# Patient Record
Sex: Female | Born: 1960 | Hispanic: No | Marital: Married | Smoking: Never smoker
Health system: Southern US, Community
[De-identification: ages and names within clinical notes are randomized; demographics above are authoritative.]

## PROBLEM LIST (undated history)

## (undated) DIAGNOSIS — C801 Malignant (primary) neoplasm, unspecified: Secondary | ICD-10-CM

## (undated) DIAGNOSIS — M359 Systemic involvement of connective tissue, unspecified: Secondary | ICD-10-CM

## (undated) DIAGNOSIS — J45909 Unspecified asthma, uncomplicated: Secondary | ICD-10-CM

## (undated) DIAGNOSIS — R112 Nausea with vomiting, unspecified: Secondary | ICD-10-CM

## (undated) DIAGNOSIS — I10 Essential (primary) hypertension: Secondary | ICD-10-CM

## (undated) DIAGNOSIS — L940 Localized scleroderma [morphea]: Secondary | ICD-10-CM

## (undated) DIAGNOSIS — F32A Depression, unspecified: Secondary | ICD-10-CM

## (undated) DIAGNOSIS — Z9889 Other specified postprocedural states: Secondary | ICD-10-CM

## (undated) DIAGNOSIS — Z8719 Personal history of other diseases of the digestive system: Secondary | ICD-10-CM

## (undated) DIAGNOSIS — F419 Anxiety disorder, unspecified: Secondary | ICD-10-CM

## (undated) DIAGNOSIS — K219 Gastro-esophageal reflux disease without esophagitis: Secondary | ICD-10-CM

## (undated) DIAGNOSIS — I890 Lymphedema, not elsewhere classified: Secondary | ICD-10-CM

## (undated) DIAGNOSIS — Z86718 Personal history of other venous thrombosis and embolism: Secondary | ICD-10-CM

## (undated) HISTORY — PX: PLANTAR FASCIECTOMY: SUR600

## (undated) HISTORY — PX: BASAL CELL CARCINOMA EXCISION: SHX1214

## (undated) HISTORY — PX: ABDOMINAL HYSTERECTOMY: SHX81

## (undated) HISTORY — PX: TONSILLECTOMY: SUR1361

## (undated) HISTORY — PX: SHOULDER SURGERY: SHX246

## (undated) HISTORY — PX: CHOLECYSTECTOMY: SHX55

## (undated) HISTORY — PX: HERNIA REPAIR: SHX51

---
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
## 2002-12-06 HISTORY — PX: HERNIA REPAIR: SHX51

## 2006-12-06 HISTORY — PX: CHOLECYSTECTOMY: SHX55

## 2011-12-07 DIAGNOSIS — C55 Malignant neoplasm of uterus, part unspecified: Secondary | ICD-10-CM | POA: Insufficient documentation

## 2012-02-14 HISTORY — PX: ROBOTIC ASSISTED TOTAL HYSTERECTOMY WITH BILATERAL SALPINGO OOPHERECTOMY: SHX6086

## 2016-06-02 DIAGNOSIS — I10 Essential (primary) hypertension: Secondary | ICD-10-CM | POA: Insufficient documentation

## 2016-09-18 DIAGNOSIS — L0390 Cellulitis, unspecified: Secondary | ICD-10-CM | POA: Insufficient documentation

## 2017-04-07 DIAGNOSIS — C801 Malignant (primary) neoplasm, unspecified: Secondary | ICD-10-CM | POA: Insufficient documentation

## 2017-08-09 DIAGNOSIS — I890 Lymphedema, not elsewhere classified: Secondary | ICD-10-CM | POA: Insufficient documentation

## 2017-08-09 DIAGNOSIS — Z780 Asymptomatic menopausal state: Secondary | ICD-10-CM | POA: Insufficient documentation

## 2017-08-09 DIAGNOSIS — Z8542 Personal history of malignant neoplasm of other parts of uterus: Secondary | ICD-10-CM | POA: Insufficient documentation

## 2017-08-09 DIAGNOSIS — I10 Essential (primary) hypertension: Secondary | ICD-10-CM | POA: Diagnosis present

## 2017-08-11 DIAGNOSIS — L821 Other seborrheic keratosis: Secondary | ICD-10-CM | POA: Insufficient documentation

## 2017-11-16 DIAGNOSIS — R5383 Other fatigue: Secondary | ICD-10-CM | POA: Insufficient documentation

## 2017-12-07 DIAGNOSIS — K529 Noninfective gastroenteritis and colitis, unspecified: Secondary | ICD-10-CM | POA: Insufficient documentation

## 2018-03-20 DIAGNOSIS — M222X1 Patellofemoral disorders, right knee: Secondary | ICD-10-CM | POA: Insufficient documentation

## 2018-04-05 DIAGNOSIS — C541 Malignant neoplasm of endometrium: Secondary | ICD-10-CM | POA: Insufficient documentation

## 2018-04-20 ENCOUNTER — Other Ambulatory Visit (HOSPITAL_COMMUNITY): Payer: Self-pay | Admitting: Orthopedic Surgery

## 2018-04-20 DIAGNOSIS — M2351 Chronic instability of knee, right knee: Secondary | ICD-10-CM

## 2018-04-20 DIAGNOSIS — G8929 Other chronic pain: Secondary | ICD-10-CM

## 2018-04-20 DIAGNOSIS — M25561 Pain in right knee: Principal | ICD-10-CM

## 2018-04-20 DIAGNOSIS — M25461 Effusion, right knee: Secondary | ICD-10-CM

## 2018-04-20 DIAGNOSIS — M2391 Unspecified internal derangement of right knee: Secondary | ICD-10-CM

## 2018-07-17 ENCOUNTER — Other Ambulatory Visit
Admission: RE | Admit: 2018-07-17 | Discharge: 2018-07-17 | Disposition: A | Discharge status: Home / Self Care | Payer: BC Managed Care – PPO | Source: Ambulatory Visit | Attending: Unknown Physician Specialty | Admitting: Unknown Physician Specialty

## 2018-07-17 DIAGNOSIS — M222X1 Patellofemoral disorders, right knee: Secondary | ICD-10-CM | POA: Diagnosis not present

## 2018-07-17 LAB — SYNOVIAL CELL COUNT + DIFF, W/ CRYSTALS
Crystals, Fluid: NONE SEEN
EOSINOPHILS-SYNOVIAL: 0 %
Lymphocytes-Synovial Fld: 17 %
MONOCYTE-MACROPHAGE-SYNOVIAL FLUID: 18 %
Neutrophil, Synovial: 65 %
OTHER CELLS-SYN: 0
WBC, Synovial: 101 /mm3 (ref 0–200)

## 2018-07-21 LAB — BODY FLUID CULTURE: CULTURE: NO GROWTH

## 2018-07-26 DIAGNOSIS — M25561 Pain in right knee: Secondary | ICD-10-CM | POA: Insufficient documentation

## 2018-12-13 DIAGNOSIS — Z01818 Encounter for other preprocedural examination: Secondary | ICD-10-CM | POA: Insufficient documentation

## 2019-04-04 DIAGNOSIS — M25511 Pain in right shoulder: Secondary | ICD-10-CM | POA: Insufficient documentation

## 2019-07-11 DIAGNOSIS — E785 Hyperlipidemia, unspecified: Secondary | ICD-10-CM | POA: Insufficient documentation

## 2019-11-12 ENCOUNTER — Other Ambulatory Visit: Payer: Self-pay

## 2019-11-12 DIAGNOSIS — Z20822 Contact with and (suspected) exposure to covid-19: Secondary | ICD-10-CM

## 2019-11-14 LAB — NOVEL CORONAVIRUS, NAA: SARS-CoV-2, NAA: NOT DETECTED

## 2020-01-16 DIAGNOSIS — G479 Sleep disorder, unspecified: Secondary | ICD-10-CM | POA: Insufficient documentation

## 2020-02-15 DIAGNOSIS — K219 Gastro-esophageal reflux disease without esophagitis: Secondary | ICD-10-CM | POA: Diagnosis present

## 2020-02-15 DIAGNOSIS — R197 Diarrhea, unspecified: Secondary | ICD-10-CM | POA: Insufficient documentation

## 2020-02-26 DIAGNOSIS — D126 Benign neoplasm of colon, unspecified: Secondary | ICD-10-CM | POA: Insufficient documentation

## 2020-07-11 DIAGNOSIS — R600 Localized edema: Secondary | ICD-10-CM | POA: Insufficient documentation

## 2020-07-11 DIAGNOSIS — I82409 Acute embolism and thrombosis of unspecified deep veins of unspecified lower extremity: Secondary | ICD-10-CM | POA: Insufficient documentation

## 2020-07-14 DIAGNOSIS — L659 Nonscarring hair loss, unspecified: Secondary | ICD-10-CM | POA: Insufficient documentation

## 2020-07-30 DIAGNOSIS — M542 Cervicalgia: Secondary | ICD-10-CM | POA: Insufficient documentation

## 2020-07-30 DIAGNOSIS — M549 Dorsalgia, unspecified: Secondary | ICD-10-CM | POA: Insufficient documentation

## 2020-07-30 DIAGNOSIS — M7918 Myalgia, other site: Secondary | ICD-10-CM | POA: Insufficient documentation

## 2020-07-30 DIAGNOSIS — M47812 Spondylosis without myelopathy or radiculopathy, cervical region: Secondary | ICD-10-CM | POA: Insufficient documentation

## 2020-07-30 DIAGNOSIS — G8929 Other chronic pain: Secondary | ICD-10-CM | POA: Insufficient documentation

## 2020-08-19 DIAGNOSIS — G43009 Migraine without aura, not intractable, without status migrainosus: Secondary | ICD-10-CM | POA: Insufficient documentation

## 2020-12-19 DIAGNOSIS — Z20822 Contact with and (suspected) exposure to covid-19: Secondary | ICD-10-CM | POA: Insufficient documentation

## 2021-03-23 DIAGNOSIS — E063 Autoimmune thyroiditis: Secondary | ICD-10-CM | POA: Insufficient documentation

## 2021-07-06 DIAGNOSIS — E063 Autoimmune thyroiditis: Secondary | ICD-10-CM | POA: Insufficient documentation

## 2021-09-07 DIAGNOSIS — R059 Cough, unspecified: Secondary | ICD-10-CM | POA: Insufficient documentation

## 2021-09-22 ENCOUNTER — Ambulatory Visit
Admission: RE | Admit: 2021-09-22 | Discharge: 2021-09-22 | Disposition: A | Discharge status: Home / Self Care | Payer: BC Managed Care – PPO | Attending: Dermatology | Admitting: Dermatology

## 2021-09-22 ENCOUNTER — Other Ambulatory Visit: Payer: Self-pay | Admitting: Dermatology

## 2021-09-22 ENCOUNTER — Ambulatory Visit
Admission: RE | Admit: 2021-09-22 | Discharge: 2021-09-22 | Disposition: A | Discharge status: Home / Self Care | Payer: BC Managed Care – PPO | Source: Ambulatory Visit | Attending: Dermatology | Admitting: Dermatology

## 2021-09-22 DIAGNOSIS — J45901 Unspecified asthma with (acute) exacerbation: Secondary | ICD-10-CM

## 2021-10-07 ENCOUNTER — Ambulatory Visit: Admitting: Internal Medicine

## 2021-10-13 DIAGNOSIS — R21 Rash and other nonspecific skin eruption: Secondary | ICD-10-CM | POA: Insufficient documentation

## 2021-10-20 DIAGNOSIS — R768 Other specified abnormal immunological findings in serum: Secondary | ICD-10-CM | POA: Insufficient documentation

## 2021-10-20 DIAGNOSIS — R7689 Other specified abnormal immunological findings in serum: Secondary | ICD-10-CM | POA: Insufficient documentation

## 2021-11-04 ENCOUNTER — Other Ambulatory Visit: Payer: Self-pay | Admitting: Orthopedic Surgery

## 2021-11-04 DIAGNOSIS — M25512 Pain in left shoulder: Secondary | ICD-10-CM

## 2021-11-04 DIAGNOSIS — R7989 Other specified abnormal findings of blood chemistry: Secondary | ICD-10-CM | POA: Insufficient documentation

## 2021-12-01 ENCOUNTER — Other Ambulatory Visit

## 2021-12-10 DIAGNOSIS — H9221 Otorrhagia, right ear: Secondary | ICD-10-CM | POA: Insufficient documentation

## 2021-12-14 ENCOUNTER — Other Ambulatory Visit: Payer: Self-pay

## 2021-12-14 ENCOUNTER — Ambulatory Visit
Admission: RE | Admit: 2021-12-14 | Discharge: 2021-12-14 | Disposition: A | Discharge status: Home / Self Care | Payer: BC Managed Care – PPO | Source: Ambulatory Visit | Attending: Orthopedic Surgery | Admitting: Orthopedic Surgery

## 2021-12-14 DIAGNOSIS — M25512 Pain in left shoulder: Secondary | ICD-10-CM

## 2022-01-06 DIAGNOSIS — M7502 Adhesive capsulitis of left shoulder: Secondary | ICD-10-CM | POA: Insufficient documentation

## 2022-01-12 DIAGNOSIS — R3129 Other microscopic hematuria: Secondary | ICD-10-CM | POA: Insufficient documentation

## 2022-01-27 DIAGNOSIS — M7502 Adhesive capsulitis of left shoulder: Secondary | ICD-10-CM | POA: Insufficient documentation

## 2022-01-27 DIAGNOSIS — M19012 Primary osteoarthritis, left shoulder: Secondary | ICD-10-CM | POA: Insufficient documentation

## 2022-02-03 ENCOUNTER — Encounter: Payer: Self-pay | Admitting: Urology

## 2022-02-03 ENCOUNTER — Other Ambulatory Visit: Payer: Self-pay

## 2022-02-03 ENCOUNTER — Ambulatory Visit (INDEPENDENT_AMBULATORY_CARE_PROVIDER_SITE_OTHER): Payer: BC Managed Care – PPO | Admitting: Urology

## 2022-02-03 VITALS — BP 143/75 | HR 76 | Ht 67.0 in | Wt 172.0 lb

## 2022-02-03 DIAGNOSIS — R319 Hematuria, unspecified: Secondary | ICD-10-CM

## 2022-02-03 DIAGNOSIS — R399 Unspecified symptoms and signs involving the genitourinary system: Secondary | ICD-10-CM | POA: Diagnosis not present

## 2022-02-03 DIAGNOSIS — R3129 Other microscopic hematuria: Secondary | ICD-10-CM

## 2022-02-03 LAB — BLADDER SCAN AMB NON-IMAGING: Scan Result: 0

## 2022-02-03 MED ORDER — OXYBUTYNIN CHLORIDE 5 MG PO TABS: 5.0000 mg | ORAL_TABLET | Freq: Three times a day (TID) | ORAL | 1 refills | Status: DC

## 2022-02-04 ENCOUNTER — Encounter: Payer: Self-pay | Admitting: Urology

## 2022-02-04 LAB — URINALYSIS, COMPLETE
Bilirubin, UA: NEGATIVE
Glucose, UA: NEGATIVE
Ketones, UA: NEGATIVE
Leukocytes,UA: NEGATIVE
Nitrite, UA: NEGATIVE
Protein,UA: NEGATIVE
Specific Gravity, UA: 1.01 (ref 1.005–1.030)
Urobilinogen, Ur: 0.2 mg/dL (ref 0.2–1.0)
pH, UA: 7 (ref 5.0–7.5)

## 2022-02-04 LAB — MICROSCOPIC EXAMINATION
Bacteria, UA: NONE SEEN
Epithelial Cells (non renal): NONE SEEN /hpf (ref 0–10)
WBC, UA: NONE SEEN /hpf (ref 0–5)

## 2022-02-04 NOTE — Progress Notes (Signed)
? ?02/03/2022 ?7:18 AM  ? ?Elizabeth Martin ?06/13/1961 ?[id number] ? ?Referring provider: Dianne Dun, MD ?[address] ?[place],  [place] [address] ? ?Chief Complaint  ?Patient presents with  ? Hematuria  ? ? ?HPI: ?Elizabeth Martin is a 61 y.o. female referred for evaluation of hematuria. ? ?Seen by her rheumatologist November 2022 and UA showed 4-10 RBCs ?Follow-up with her PCP was recommended who she saw earlier this month and UA showed 24 RBCs on microscopy ?CT urogram performed 01/25/2022 which showed no significant upper tract abnormalities.  There were small bilateral hypoattenuating renal lesions statistically consistent with renal cysts ?Denies gross hematuria ?Prior history of pelvic radiation for endometrial cancer and postradiation has noted urinary frequency, urgency with occasional episodes urge incontinence ? ? ?PMH: ? Asthma  ? Cancer [facility])  ? Clotting disorder ([place])  ? Hashimoto's disease  ? Hypertension  ? ?Surgical History: ? CHOLECYSTECTOMY  ?1998  ? HERNIA REPAIR  ?2002  ? HYSTERECTOMY  ?2013  ? TUBAL LIGATION  ?1986  ? ?Home Medications:  ?Allergies as of 02/03/2022   ? ?   Reactions  ? Carboplatin Anaphylaxis  ? ANAPHYLACTIC REACTION ?ANAPHYLACTIC REACTION  ? Nitrofurantoin Nausea Only, Nausea And Vomiting  ? Silicone Itching, Rash  ? Wheat Bran Other (See Comments)  ? Other reaction(s): Other (See Comments) ?Body aches and swelling ?Body aches and swelling  ? Fentanyl Hives, Itching, Rash  ? ?  ? ?  ?Medication List  ?  ? ?  ? Accurate as of February 03, 2022 11:59 PM. If you have any questions, ask your nurse or doctor.  ?  ?  ? ?  ? ?albuterol 108 (90 Base) MCG/ACT inhaler ?Commonly known as: VENTOLIN HFA ?Inhale 2 puffs into the lungs every 6 (six) hours as needed. ?  ?benzonatate 100 MG capsule ?Commonly known as: TESSALON ?Take 100 mg by mouth every 6 (six) hours as needed. ?  ?cetirizine 10 MG tablet ?Commonly known as: ZYRTEC ?Take 10 mg by mouth daily. ?  ?chlorthalidone 50 MG  tablet ?Commonly known as: HYGROTON ?Take 50 mg by mouth every morning. ?  ?DULoxetine 30 MG capsule ?Commonly known as: CYMBALTA ?Take 90 mg by mouth daily. ?  ?gabapentin 100 MG capsule ?Commonly known as: NEURONTIN ?Take 100 mg by mouth at bedtime. ?  ?hydroxychloroquine 200 MG tablet ?Commonly known as: PLAQUENIL ?Take 200 mg by mouth 2 (two) times daily. ?  ?hydrOXYzine 25 MG tablet ?Commonly known as: ATARAX ?SMARTSIG:1-2 Tablet(s) By Mouth Every Evening ?  ?ibuprofen 800 MG tablet ?Commonly known as: ADVIL ?ibuprofen 800 mg tablet ?  ?lisinopril 20 MG tablet ?Commonly known as: ZESTRIL ?Take 1 tablet by mouth daily. ?  ?LORazepam 1 MG tablet ?Commonly known as: ATIVAN ?lorazepam 1 mg tablet ?  ?multivitamin capsule ?Take 1 capsule by mouth daily. ?  ?ofloxacin 0.3 % OTIC solution ?Commonly known as: FLOXIN ?SMARTSIG:10 Drop(s) In Ear(s) Twice Daily ?  ?oxybutynin 5 MG tablet ?Commonly known as: DITROPAN ?Take 1 tablet (5 mg total) by mouth 3 (three) times daily. ?Started by: Abbie Sons, MD ?  ?potassium chloride 10 MEQ tablet ?Commonly known as: KLOR-CON ?potassium chloride ER 10 mEq tablet,extended release ? Take 1 tablet every day by oral route. ?  ?tretinoin 0.1 % cream ?Commonly known as: RETIN-A ?tretinoin 0.1 % topical cream ?  ?venlafaxine XR 150 MG 24 hr capsule ?Commonly known as: EFFEXOR-XR ?venlafaxine ER 150 mg capsule,extended release 24 hr ?  ? ?  ? ? ?  Allergies:  ?Allergies  ?Allergen Reactions  ? Carboplatin Anaphylaxis  ?  ANAPHYLACTIC REACTION ?ANAPHYLACTIC REACTION ?  ? Nitrofurantoin Nausea Only and Nausea And Vomiting  ? Silicone Itching and Rash  ? Wheat Bran Other (See Comments)  ?  Other reaction(s): Other (See Comments) ?Body aches and swelling ?Body aches and swelling ?  ? Fentanyl Hives, Itching and Rash  ? ? ?Family History: ?No family history on file. ? ?Social History:  reports that she has never smoked. She has never used smokeless tobacco. She reports that she does not  drink alcohol. No history on file for drug use. ? ? ?Physical Exam: ?BP (!) 143/75   Pulse 76   Ht 5\' 7"  (1.702 m)   Wt 172 lb (78 kg)   BMI 26.94 kg/m?   ?Constitutional:  Alert and oriented, No acute distress. ?HEENT: [place] AT, moist mucus membranes.  Trachea midline, no masses. ?Respiratory: Normal respiratory effort, no increased work of breathing. ?GI: Abdomen is soft, nontender, nondistended, no abdominal masses ?Psychiatric: Normal mood and affect. ? ?Laboratory Data: ? ?Urinalysis ?Dipstick/microscopy negative ? ? ?Pertinent Imaging: ?CT images were personally reviewed and interpreted on [facility] ? ?Assessment & Plan:   ? ?1.  Microscopic hematuria ?UA today clear ?AUA hematuria risk stratification: High ?The recommended evaluation of high risk hematuria was discussed.  She has had upper tract evaluation with CT urogram and recommend scheduling cystoscopy for lower tract evaluation.  The procedure was discussed in detail. ?Prior history pelvic radiation with slight increased risk of urothelial carcinoma bladder ? ?2.  Lower urinary tract symptoms ?Occurring after radiation ?Symptoms are not bothersome when she is at home however she did inquire if there is any prn medication she could take when she is out.  Trial oxybutynin IR  ? ? ?Abbie Sons, MD ? ?[place] ?[address], [address] ?[place],  [address] ?[phone] ? ?

## 2022-02-12 ENCOUNTER — Ambulatory Visit (INDEPENDENT_AMBULATORY_CARE_PROVIDER_SITE_OTHER): Payer: BC Managed Care – PPO | Admitting: Urology

## 2022-02-12 ENCOUNTER — Encounter: Payer: Self-pay | Admitting: Urology

## 2022-02-12 ENCOUNTER — Other Ambulatory Visit: Payer: Self-pay

## 2022-02-12 VITALS — BP 131/81 | HR 83 | Ht 67.0 in | Wt 170.0 lb

## 2022-02-12 DIAGNOSIS — R3129 Other microscopic hematuria: Secondary | ICD-10-CM | POA: Diagnosis not present

## 2022-02-12 NOTE — Progress Notes (Signed)
? ?  02/12/22 ? ?CC:  ?Chief Complaint  ?Patient presents with  ? Cysto  ? ? ?HPI: Refer to my previous note 02/03/2022.  No complaints today ? ?Blood pressure 131/81, pulse 83, height '5\' 7"'$  (1.702 m), weight 170 lb (77.1 kg). ?NED. A&Ox3.   ?No respiratory distress   ?Abd soft, NT, ND ?Atrophic external genitalia with patent urethral meatus ? ?Cystoscopy Procedure Note ? ?Patient identification was confirmed, informed consent was obtained, and patient was prepped using Betadine solution.  Lidocaine jelly was administered per urethral meatus.   ? ?Procedure: ?- Flexible cystoscope introduced, without any difficulty.   ?- Thorough search of the bladder revealed: ?   normal urethral meatus ?   normal urothelium ?   no stones ?   no ulcers  ?   no tumors ?   Prominent hypervascularity-most likely radiation effect ?   no urethral polyps ?   no trabeculation ? ?- Ureteral orifices were normal in position and appearance. ? ?Post-Procedure: ?- Patient tolerated the procedure well ? ?Assessment/ Plan: ?No mucosal abnormalities ?1 year follow-up with UA ? ? ?Abbie Sons, MD ? ?

## 2022-02-13 ENCOUNTER — Encounter: Payer: Self-pay | Admitting: Urology

## 2022-02-13 LAB — URINALYSIS, COMPLETE
Bilirubin, UA: NEGATIVE
Glucose, UA: NEGATIVE
Ketones, UA: NEGATIVE
Leukocytes,UA: NEGATIVE
Nitrite, UA: NEGATIVE
Protein,UA: NEGATIVE
Specific Gravity, UA: 1.01 (ref 1.005–1.030)
Urobilinogen, Ur: 0.2 mg/dL (ref 0.2–1.0)
pH, UA: 7 (ref 5.0–7.5)

## 2022-02-13 LAB — MICROSCOPIC EXAMINATION
Bacteria, UA: NONE SEEN
Epithelial Cells (non renal): NONE SEEN /hpf (ref 0–10)
WBC, UA: NONE SEEN /hpf (ref 0–5)

## 2022-02-16 LAB — CYTOLOGY - NON PAP

## 2022-02-17 ENCOUNTER — Encounter: Payer: Self-pay | Admitting: Urology

## 2022-02-17 DIAGNOSIS — M351 Other overlap syndromes: Secondary | ICD-10-CM | POA: Insufficient documentation

## 2022-02-24 DIAGNOSIS — M1712 Unilateral primary osteoarthritis, left knee: Secondary | ICD-10-CM | POA: Insufficient documentation

## 2022-02-25 ENCOUNTER — Other Ambulatory Visit: Payer: BC Managed Care – PPO | Admitting: Urology

## 2022-02-26 ENCOUNTER — Encounter: Payer: Self-pay | Admitting: Urology

## 2022-02-26 ENCOUNTER — Other Ambulatory Visit: Payer: Self-pay | Admitting: Urology

## 2022-02-26 NOTE — Telephone Encounter (Signed)
Called pt, no answer. LM for pt to call back to clarify if she is taking Oxybutynin and if she does in fact need a refill.  ?

## 2022-03-24 HISTORY — PX: ROTATOR CUFF REPAIR: SHX139

## 2022-03-29 ENCOUNTER — Ambulatory Visit: Payer: Self-pay | Admitting: *Deleted

## 2022-03-29 NOTE — Telephone Encounter (Signed)
?  Chief Complaint: Leg pain ?Symptoms: 8/10 right leg pain at calf. Has lymphedema so unsure of swelling. 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 ?Frequency: This AM ?Pertinent Negatives: Patient denies SOB ?Disposition: '[x]'$ ED /'[]'$ Urgent Care (no appt availability in office) / '[]'$ Appointment(In office/virtual)/ '[]'$  [facility]/ '[]'$ Home Care/ '[]'$ Refused Recommended Disposition /'[]'$ [facility]/ '[]'$  Follow-up with PCP ?Additional Notes: Pt unsure she will follow disposition.Reiterated  need for ED with history. States will discuss with husband. ?Reason for Disposition ? [1] Thigh or calf pain AND [2] only 1 side AND [3] present > 1 hour (Exception: chronic unchanged pain) ? ?Answer Assessment - Initial Assessment Questions ?1. ONSET: "When did the pain start?"  ?    This AM ?2. LOCATION: "Where is the pain located?"  ?    Right calf ?3. PAIN: "How bad is the pain?"    (Scale 1-10; or mild, moderate, severe) ?  -  MILD (1-3): doesn't interfere with normal activities  ?  -  MODERATE (4-7): interferes with normal activities (e.g., work or school) or awakens from sleep, limping  ?  -  SEVERE (8-10): excruciating pain, unable to do any normal activities, unable to walk ?    8/10 with movement ?4. WORK OR EXERCISE: "Has there been any recent work or exercise that involved this part of the body?"  ?     ?5. CAUSE: "What do you think is causing the leg pain?" ?     ?6. OTHER SYMPTOMS: "Do you have any other symptoms?" (e.g., chest pain, back pain, breathing difficulty, swelling, rash, fever, numbness, weakness) ?    None ? ?Protocols used: Leg Pain-A-AH ? ?

## 2022-03-31 ENCOUNTER — Emergency Department
Admission: EM | Admit: 2022-03-31 | Discharge: 2022-03-31 | Disposition: A | Discharge status: Home / Self Care | Payer: BC Managed Care – PPO | Attending: Emergency Medicine | Admitting: Emergency Medicine

## 2022-03-31 ENCOUNTER — Encounter: Payer: Self-pay | Admitting: Emergency Medicine

## 2022-03-31 ENCOUNTER — Emergency Department: Payer: BC Managed Care – PPO

## 2022-03-31 ENCOUNTER — Other Ambulatory Visit: Payer: Self-pay

## 2022-03-31 DIAGNOSIS — M79604 Pain in right leg: Secondary | ICD-10-CM | POA: Diagnosis present

## 2022-03-31 DIAGNOSIS — I8001 Phlebitis and thrombophlebitis of superficial vessels of right lower extremity: Secondary | ICD-10-CM | POA: Insufficient documentation

## 2022-03-31 DIAGNOSIS — M79661 Pain in right lower leg: Secondary | ICD-10-CM

## 2022-03-31 HISTORY — DX: Personal history of other venous thrombosis and embolism: Z86.718

## 2022-03-31 HISTORY — DX: Systemic involvement of connective tissue, unspecified: M35.9

## 2022-03-31 HISTORY — DX: Malignant (primary) neoplasm, unspecified: C80.1

## 2022-03-31 HISTORY — DX: Unspecified asthma, uncomplicated: J45.909

## 2022-03-31 HISTORY — DX: Essential (primary) hypertension: I10

## 2022-03-31 LAB — BASIC METABOLIC PANEL
Anion gap: 10 (ref 5–15)
BUN: 15 mg/dL (ref 6–20)
CO2: 29 mmol/L (ref 22–32)
Calcium: 9.3 mg/dL (ref 8.9–10.3)
Chloride: 97 mmol/L — ABNORMAL LOW (ref 98–111)
Creatinine, Ser: 0.62 mg/dL (ref 0.44–1.00)
GFR, Estimated: >60 mL/min (ref >60)
Glucose, Bld: 128 mg/dL — ABNORMAL HIGH (ref 70–99)
Potassium: 3.3 mmol/L — ABNORMAL LOW (ref 3.5–5.1)
Sodium: 136 mmol/L (ref 135–145)

## 2022-03-31 LAB — CBC
HCT: 41.3 % (ref 36.0–46.0)
Hemoglobin: 13.7 g/dL (ref 12.0–15.0)
MCH: 29.7 pg (ref 26.0–34.0)
MCHC: 33.2 g/dL (ref 30.0–36.0)
MCV: 89.4 fL (ref 80.0–100.0)
Platelets: 393 10*3/uL (ref 150–400)
RBC: 4.62 MIL/uL (ref 3.87–5.11)
RDW: 13.3 % (ref 11.5–15.5)
WBC: 9.4 10*3/uL (ref 4.0–10.5)
nRBC: 0 % (ref 0.0–0.2)

## 2022-03-31 MED ORDER — CLOPIDOGREL BISULFATE 75 MG PO TABS: 75.0000 mg | ORAL_TABLET | Freq: Every day | ORAL | 3 refills | Status: DC

## 2022-03-31 NOTE — Discharge Instructions (Signed)
Your ultrasound showed a thrombus superficial vein of the right calf muscle.  A prescription for Plavix has been sent to the pharmacy.  You may consult with your vascular specialist prior starting the medication.  Rest with the leg elevated and apply warm compress to help promote resolution. ?

## 2022-03-31 NOTE — ED Triage Notes (Signed)
Pt to ED from home c/o right calf pain 2 days getting worse.  Some swelling to right leg without redness.  Had left shoulder surgery on 4/19.  Hx of blood clots and states this pain feels similar.  Denies SOB and not on blood thinners.  Pt A&Ox4, chest rise even and unlabored, skin WNL and in NAD at this time. ?

## 2022-03-31 NOTE — ED Notes (Signed)
US at the bedside

## 2022-03-31 NOTE — ED Provider Notes (Signed)
? ? ?[facility] ?Emergency Department Provider Note ? ? ? ? Event Date/Time  ? First MD Initiated Contact with Patient 03/31/22 1938   ?  (approximate) ? ? ?History  ? ?Leg Pain ? ? ?HPI ? ?Elizabeth Martin is a 61 y.o. female with a history of RLE lymphangitis, presents to the ED with 2 days of right calf pain and swelling.  Patient denies any significant redness and denies any preceding injury or trauma.  Patient had shoulder surgery on April 19 without incident.  She has a history of blood clots and reports this pain feels similar.  She denies any current blood thinner therapy.  She denies any chest pain, shortness of breath, or distal paresthesias. ?  ? ? ?Physical Exam  ? ?Triage Vital Signs: ?ED Triage Vitals  ?Enc Vitals Group  ?   BP 03/31/22 1918 131/83  ?   Pulse Rate 03/31/22 1918 (!) 102  ?   Resp 03/31/22 1918 16  ?   Temp 03/31/22 1918 99.2 ?F (37.3 ?C)  ?   Temp Source 03/31/22 1918 Oral  ?   SpO2 03/31/22 1918 97 %  ?   Weight 03/31/22 1918 165 lb (74.8 kg)  ?   Height 03/31/22 1918 '5\' 7"'$  (1.702 m)  ?   Head Circumference --   ?   Peak Flow --   ?   Pain Score 03/31/22 1917 8  ?   Pain Loc --   ?   Pain Edu? --   ?   Excl. in [place]? --   ? ? ?Most recent vital signs: ?Vitals:  ? 03/31/22 1918 03/31/22 2216  ?BP: 131/83 128/77  ?Pulse: (!) 102 98  ?Resp: 16 20  ?Temp: 99.2 ?F (37.3 ?C)   ?SpO2: 97% 98%  ? ? ?General Awake, no distress.  ?CV:  Good peripheral perfusion. Skin warm and dry. ?RESP:  Normal effort.  ?ABD:  No distention.  ?MSK:  RLE lymphangitis noted. No erythetma or induration. Tender to palp over the gastroc muscle belly ? ?ED Results / Procedures / Treatments  ? ?Labs ?(all labs ordered are listed, but only abnormal results are displayed) ?Labs Reviewed  ?BASIC METABOLIC PANEL - Abnormal; Notable for the following components:  ?    Result Value  ? Potassium 3.3 (*)   ? Chloride 97 (*)   ? Glucose, Bld 128 (*)   ? All other components within normal limits  ?CBC   ? ? ? ?EKG ? ? ?RADIOLOGY ? ?I personally viewed and evaluated these images as part of my medical decision making, as well as reviewing the written report by the radiologist. ? ?ED Provider Interpretation: RLE thrombus noted} ? ?US Venous Img Lower Unilateral Right ? ?Result Date: 03/31/2022 ?CLINICAL DATA:  Right calf pain. EXAM: RIGHT LOWER EXTREMITY VENOUS DOPPLER ULTRASOUND TECHNIQUE: Gray-scale sonography with compression, as well as color and duplex ultrasound, were performed to evaluate the deep venous system(s) from the level of the common femoral vein through the popliteal and proximal calf veins. COMPARISON:  None. FINDINGS: VENOUS Normal compressibility of the common femoral, superficial femoral, and popliteal veins. The posterior tibial and peroneal veins in the calf are patent. Visualized portions of profunda femoral vein and great saphenous vein unremarkable. No filling defects to suggest DVT on grayscale or color Doppler imaging. Doppler waveforms show normal direction of venous flow, normal respiratory plasticity and response to augmentation. There is thrombus within the intramuscular gastrocnemius vein that appears occlusive. Limited views  of the contralateral common femoral vein are unremarkable. OTHER None. Limitations: none IMPRESSION: 1. There is thrombus within the intramuscular gastrocnemius vein in the calf. The remaining right lower extremity veins are patent, with no thrombus extension into the popliteal vein. 2. Consider short interval follow-up ultrasound in 3-5 days to assess for propagation to the popliteal vein, particularly if symptoms worsen. Discussed with Dr Kerman Passey Electronically Signed   By: Keith Rake M.D.   On: 03/31/2022 21:38   ? ? ?PROCEDURES: ? ?Critical Care performed: No ? ?Procedures ? ? ?MEDICATIONS ORDERED IN ED: ?Medications - No data to display ? ? ?IMPRESSION / MDM / ASSESSMENT AND PLAN / ED COURSE  ?I reviewed the triage vital signs and the nursing  notes. ?             ?               ? ?Differential diagnosis includes, but is not limited to, muscle strain, DVT, peripheral edema ? ?Patient to the ED for evaluation of 2 days of posterior right calf pain and muscle tenderness.  Patient presents with 1 week status post right shoulder arthroscopy.  She is evaluated for complaints, found to have a thrombus of the superficial gastrocnemius muscle vein without evidence of extension to the deeper venous structures.  Patient's diagnosis is consistent with superficial thrombus.  We discussed the options for management including conservative management with warm compresses and elevation.  The alternative would be to start the patient on anticoagulation therapy given her history of previous DVT.  Patient will be discharged home with prescriptions for Plavix. Patient is to follow up with her vascular provider as scheduled as needed or otherwise directed. Patient is given ED precautions to return to the ED for any worsening or new symptoms.  Patient is given a copy of her ultrasound report to follow-up with her vascular provider. ? ? ?FINAL CLINICAL IMPRESSION(S) / ED DIAGNOSES  ? 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 ? ? ?Rx / DC Orders  ? ?ED Discharge Orders   ? ?      Ordered  ?  clopidogrel (PLAVIX) 75 MG tablet  Daily       ? 03/31/22 2211  ? ?  ?  ? ?  ? ? ? ?Note:  This document was prepared using Dragon voice recognition software and may include unintentional dictation errors. ? ?  ?Melvenia Needles, PA-C ?03/31/22 2314 ? ?  ?Harvest Dark, MD ?03/31/22 2318 ? ?

## 2022-05-18 ENCOUNTER — Inpatient Hospital Stay
Admission: EM | Admit: 2022-05-18 | Discharge: 2022-05-27 | DRG: 331 | Disposition: A | Discharge status: Home / Self Care | Payer: BC Managed Care – PPO | Attending: Internal Medicine | Admitting: Internal Medicine

## 2022-05-18 ENCOUNTER — Encounter: Payer: Self-pay | Admitting: Emergency Medicine

## 2022-05-18 ENCOUNTER — Emergency Department: Payer: BC Managed Care – PPO

## 2022-05-18 DIAGNOSIS — K5641 Fecal impaction: Secondary | ICD-10-CM | POA: Diagnosis present

## 2022-05-18 DIAGNOSIS — K219 Gastro-esophageal reflux disease without esophagitis: Secondary | ICD-10-CM | POA: Diagnosis present

## 2022-05-18 DIAGNOSIS — M7989 Other specified soft tissue disorders: Secondary | ICD-10-CM | POA: Diagnosis present

## 2022-05-18 DIAGNOSIS — Z885 Allergy status to narcotic agent status: Secondary | ICD-10-CM | POA: Diagnosis not present

## 2022-05-18 DIAGNOSIS — Z79899 Other long term (current) drug therapy: Secondary | ICD-10-CM

## 2022-05-18 DIAGNOSIS — I10 Essential (primary) hypertension: Secondary | ICD-10-CM | POA: Diagnosis present

## 2022-05-18 DIAGNOSIS — Z9221 Personal history of antineoplastic chemotherapy: Secondary | ICD-10-CM | POA: Diagnosis not present

## 2022-05-18 DIAGNOSIS — K449 Diaphragmatic hernia without obstruction or gangrene: Secondary | ICD-10-CM | POA: Diagnosis present

## 2022-05-18 DIAGNOSIS — E876 Hypokalemia: Secondary | ICD-10-CM | POA: Diagnosis present

## 2022-05-18 DIAGNOSIS — Z7902 Long term (current) use of antithrombotics/antiplatelets: Secondary | ICD-10-CM | POA: Diagnosis not present

## 2022-05-18 DIAGNOSIS — Z91018 Allergy to other foods: Secondary | ICD-10-CM

## 2022-05-18 DIAGNOSIS — Z86718 Personal history of other venous thrombosis and embolism: Secondary | ICD-10-CM | POA: Diagnosis not present

## 2022-05-18 DIAGNOSIS — Z539 Procedure and treatment not carried out, unspecified reason: Secondary | ICD-10-CM | POA: Diagnosis not present

## 2022-05-18 DIAGNOSIS — R1030 Lower abdominal pain, unspecified: Secondary | ICD-10-CM | POA: Diagnosis not present

## 2022-05-18 DIAGNOSIS — Z8542 Personal history of malignant neoplasm of other parts of uterus: Secondary | ICD-10-CM | POA: Diagnosis not present

## 2022-05-18 DIAGNOSIS — R1084 Generalized abdominal pain: Secondary | ICD-10-CM

## 2022-05-18 DIAGNOSIS — Z85828 Personal history of other malignant neoplasm of skin: Secondary | ICD-10-CM

## 2022-05-18 DIAGNOSIS — Z888 Allergy status to other drugs, medicaments and biological substances status: Secondary | ICD-10-CM

## 2022-05-18 DIAGNOSIS — R600 Localized edema: Secondary | ICD-10-CM

## 2022-05-18 DIAGNOSIS — K56699 Other intestinal obstruction unspecified as to partial versus complete obstruction: Secondary | ICD-10-CM | POA: Diagnosis not present

## 2022-05-18 DIAGNOSIS — K5730 Diverticulosis of large intestine without perforation or abscess without bleeding: Secondary | ICD-10-CM | POA: Diagnosis present

## 2022-05-18 DIAGNOSIS — R109 Unspecified abdominal pain: Secondary | ICD-10-CM | POA: Diagnosis present

## 2022-05-18 DIAGNOSIS — K5669 Other partial intestinal obstruction: Principal | ICD-10-CM | POA: Diagnosis present

## 2022-05-18 DIAGNOSIS — K56609 Unspecified intestinal obstruction, unspecified as to partial versus complete obstruction: Principal | ICD-10-CM

## 2022-05-18 DIAGNOSIS — J45909 Unspecified asthma, uncomplicated: Secondary | ICD-10-CM | POA: Diagnosis present

## 2022-05-18 DIAGNOSIS — D72829 Elevated white blood cell count, unspecified: Secondary | ICD-10-CM | POA: Diagnosis not present

## 2022-05-18 DIAGNOSIS — Z923 Personal history of irradiation: Secondary | ICD-10-CM | POA: Diagnosis not present

## 2022-05-18 HISTORY — DX: Lymphedema, not elsewhere classified: I89.0

## 2022-05-18 LAB — CBC
HCT: 41.6 % (ref 36.0–46.0)
Hemoglobin: 14.1 g/dL (ref 12.0–15.0)
MCH: 29.8 pg (ref 26.0–34.0)
MCHC: 33.9 g/dL (ref 30.0–36.0)
MCV: 87.9 fL (ref 80.0–100.0)
Platelets: 336 10*3/uL (ref 150–400)
RBC: 4.73 MIL/uL (ref 3.87–5.11)
RDW: 12.8 % (ref 11.5–15.5)
WBC: 8 10*3/uL (ref 4.0–10.5)
nRBC: 0 % (ref 0.0–0.2)

## 2022-05-18 LAB — URINALYSIS, ROUTINE W REFLEX MICROSCOPIC
Bilirubin Urine: NEGATIVE
Glucose, UA: NEGATIVE mg/dL
Hgb urine dipstick: NEGATIVE
Ketones, ur: NEGATIVE mg/dL
Leukocytes,Ua: NEGATIVE
Nitrite: NEGATIVE
Protein, ur: NEGATIVE mg/dL
Specific Gravity, Urine: 1.024 (ref 1.005–1.030)
pH: 7 (ref 5.0–8.0)

## 2022-05-18 LAB — COMPREHENSIVE METABOLIC PANEL
ALT: 27 U/L (ref 0–44)
AST: 30 U/L (ref 15–41)
Albumin: 4.2 g/dL (ref 3.5–5.0)
Alkaline Phosphatase: 42 U/L (ref 38–126)
Anion gap: 11 (ref 5–15)
BUN: 10 mg/dL (ref 8–23)
CO2: 27 mmol/L (ref 22–32)
Calcium: 9.4 mg/dL (ref 8.9–10.3)
Chloride: 96 mmol/L — ABNORMAL LOW (ref 98–111)
Creatinine, Ser: 0.49 mg/dL (ref 0.44–1.00)
GFR, Estimated: >60 mL/min (ref >60)
Glucose, Bld: 109 mg/dL — ABNORMAL HIGH (ref 70–99)
Potassium: 3 mmol/L — ABNORMAL LOW (ref 3.5–5.1)
Sodium: 134 mmol/L — ABNORMAL LOW (ref 135–145)
Total Bilirubin: 0.7 mg/dL (ref 0.3–1.2)
Total Protein: 7.2 g/dL (ref 6.5–8.1)

## 2022-05-18 LAB — MAGNESIUM: Magnesium: 2.5 mg/dL — ABNORMAL HIGH (ref 1.7–2.4)

## 2022-05-18 LAB — LIPASE, BLOOD: Lipase: 93 U/L — ABNORMAL HIGH (ref 11–51)

## 2022-05-18 MED ORDER — LACTULOSE 10 GM/15ML PO SOLN
20.0000 g | Freq: Once | ORAL | Status: AC
  Administered 2022-05-18: 20 g via ORAL
  Filled 2022-05-18: qty 30

## 2022-05-18 MED ORDER — HYDRALAZINE HCL 20 MG/ML IJ SOLN
10.0000 mg | Freq: Three times a day (TID) | INTRAMUSCULAR | Status: AC
  Administered 2022-05-18 – 2022-05-19 (×3): 10 mg via INTRAVENOUS
  Filled 2022-05-18 (×3): qty 1

## 2022-05-18 MED ORDER — ACETAMINOPHEN 650 MG RE SUPP: 650.0000 mg | Freq: Four times a day (QID) | RECTAL | Status: DC | PRN

## 2022-05-18 MED ORDER — MORPHINE SULFATE (PF) 2 MG/ML IV SOLN
2.0000 mg | INTRAVENOUS | Status: DC | PRN
  Administered 2022-05-19 (×2): 2 mg via INTRAVENOUS
  Filled 2022-05-18 (×2): qty 1

## 2022-05-18 MED ORDER — SODIUM CHLORIDE 0.9% FLUSH
3.0000 mL | Freq: Two times a day (BID) | INTRAVENOUS | Status: DC
  Administered 2022-05-19 – 2022-05-27 (×14): 3 mL via INTRAVENOUS

## 2022-05-18 MED ORDER — SODIUM CHLORIDE 0.9 % IV SOLN: INTRAVENOUS | Status: AC

## 2022-05-18 MED ORDER — HYDROMORPHONE HCL 1 MG/ML IJ SOLN
1.0000 mg | Freq: Once | INTRAMUSCULAR | Status: AC
  Administered 2022-05-18: 1 mg via INTRAVENOUS
  Filled 2022-05-18: qty 1

## 2022-05-18 MED ORDER — PANTOPRAZOLE SODIUM 40 MG IV SOLR
40.0000 mg | Freq: Two times a day (BID) | INTRAVENOUS | Status: DC
  Administered 2022-05-18 – 2022-05-27 (×16): 40 mg via INTRAVENOUS
  Filled 2022-05-18 (×17): qty 10

## 2022-05-18 MED ORDER — ONDANSETRON HCL 4 MG/2ML IJ SOLN
4.0000 mg | Freq: Once | INTRAMUSCULAR | Status: AC
  Administered 2022-05-18: 4 mg via INTRAVENOUS
  Filled 2022-05-18: qty 2

## 2022-05-18 MED ORDER — HEPARIN SODIUM (PORCINE) 5000 UNIT/ML IJ SOLN
5000.0000 [IU] | Freq: Three times a day (TID) | INTRAMUSCULAR | Status: DC
  Administered 2022-05-18 – 2022-05-24 (×16): 5000 [IU] via SUBCUTANEOUS
  Filled 2022-05-18 (×17): qty 1

## 2022-05-18 MED ORDER — IOHEXOL 300 MG/ML  SOLN
100.0000 mL | Freq: Once | INTRAMUSCULAR | Status: AC | PRN
  Administered 2022-05-18: 100 mL via INTRAVENOUS

## 2022-05-18 MED ORDER — MILK AND MOLASSES ENEMA
1.0000 | Freq: Once | RECTAL | Status: AC
Start: 2022-05-18 — End: 2022-05-19
  Administered 2022-05-19: 240 mL via RECTAL
  Filled 2022-05-18: qty 240

## 2022-05-18 MED ORDER — POTASSIUM CHLORIDE 10 MEQ/100ML IV SOLN
10.0000 meq | INTRAVENOUS | Status: AC
  Administered 2022-05-18 (×2): 10 meq via INTRAVENOUS
  Filled 2022-05-18 (×2): qty 100

## 2022-05-18 MED ORDER — ACETAMINOPHEN 325 MG PO TABS
650.0000 mg | ORAL_TABLET | Freq: Four times a day (QID) | ORAL | Status: DC | PRN
Start: 2022-05-18 — End: 2022-05-25

## 2022-05-18 MED ORDER — LACTATED RINGERS IV BOLUS
1000.0000 mL | Freq: Once | INTRAVENOUS | Status: AC
  Administered 2022-05-18: 1000 mL via INTRAVENOUS

## 2022-05-18 NOTE — H&P (Signed)
History and Physical    Patient: Elizabeth Martin WNU:[id number] DOB: 04-10-1961 DOA: 05/18/2022 DOS: the patient was seen and examined on 05/18/2022 PCP: Dianne Dun, MD  Patient coming from: Home  Chief Complaint:  Chief Complaint  Patient presents with   Abdominal Pain   Constipation   HPI: Elizabeth Martin is a 61 y.o. female with medical history significant of asthma, endometrial cancer, history of VTE's and hypertension coming in with abdominal pain and constipation.  Patient has a history of abdominal pain going on for few days getting worse along with nausea and vomiting.  Patient has history of mixed connective tissue disease.  Chart review showed a DVT in the left gastrocnemius vein in April with recommendations for follow ultrasound for extension into the popliteal.  Patient denies any history of tobacco abuse. Patient denies any hematemesis melena hematochezia blurred vision headaches dizziness palpitations chest pain fevers chills or any other symptoms.  Review of Systems  Gastrointestinal:  Positive for abdominal pain, constipation, nausea and vomiting.  All other systems reviewed and are negative.   Past Medical History:  Diagnosis Date   Asthma    Autoimmune disease ([place])    Cancer ([place])    Endometrial   H/O blood clots    Hypertension    Past Surgical History:  Procedure Laterality Date   ABDOMINAL HYSTERECTOMY     BASAL CELL CARCINOMA EXCISION Left    CHOLECYSTECTOMY     HERNIA REPAIR     PLANTAR FASCIECTOMY     SHOULDER SURGERY Left    TONSILLECTOMY     Social History:  reports that she has never smoked. She has never used smokeless tobacco. She reports current alcohol use. She reports that she does not use drugs.  Allergies  Allergen Reactions   Carboplatin Anaphylaxis    ANAPHYLACTIC REACTION ANAPHYLACTIC REACTION    Nitrofurantoin Nausea Only and Nausea And Vomiting   Silicone Itching and Rash   Wheat Bran Other (See Comments)    Other  reaction(s): Other (See Comments) Body aches and swelling Body aches and swelling    Fentanyl Hives, Itching and Rash    History reviewed. No pertinent family history.  Prior to Admission medications   Medication Sig Start Date End Date Taking? Authorizing Provider  albuterol (VENTOLIN HFA) 108 (90 Base) MCG/ACT inhaler Inhale 2 puffs into the lungs every 6 (six) hours as needed. 01/11/22   [provider]  cetirizine (ZYRTEC) 10 MG tablet Take 10 mg by mouth daily. 08/10/21   [provider]  chlorthalidone (HYGROTON) 50 MG tablet Take 50 mg by mouth every morning. 11/08/21   [provider]  clopidogrel (PLAVIX) 75 MG tablet Take 1 tablet (75 mg total) by mouth daily. 03/31/22 03/31/23  Menshew, Dannielle Karvonen, PA-C  DULoxetine (CYMBALTA) 30 MG capsule Take 90 mg by mouth daily. 11/16/21   [provider]  hydroxychloroquine (PLAQUENIL) 200 MG tablet Take 200 mg by mouth 2 (two) times daily. 01/11/22   [provider]  lisinopril (ZESTRIL) 20 MG tablet Take 1 tablet by mouth daily. 02/15/17   [provider]  Multiple Vitamin (MULTIVITAMIN) capsule Take 1 capsule by mouth daily.    [provider]    Physical Exam: Vitals:   05/18/22 1910 05/18/22 2301 05/18/22 2337  BP: (!) 147/91 (!) 146/93 (!) 150/80  Pulse: 94 92 73  Resp: '18 18 20  '$ Temp: 98.2 F (36.8 C)  98.3 F (36.8 C)  TempSrc: Oral    SpO2: 96% 98%  100%  Weight: 74.8 kg    Height: '5\' 7"'$  (1.702 m)     Physical Exam Vitals and nursing note reviewed.  Constitutional:      General: She is not in acute distress.    Appearance: Normal appearance. She is not ill-appearing, toxic-appearing or diaphoretic.  HENT:     Head: Normocephalic and atraumatic.     Right Ear: Hearing and external ear normal.     Left Ear: Hearing and external ear normal.     Nose: Nose normal. No nasal deformity.     Mouth/Throat:     Lips: Pink.     Mouth: Mucous membranes are moist.      Tongue: No lesions.     Pharynx: Oropharynx is clear.  Eyes:     Extraocular Movements: Extraocular movements intact.     Pupils: Pupils are equal, round, and reactive to light.  Neck:     Vascular: No carotid bruit.  Cardiovascular:     Rate and Rhythm: Normal rate and regular rhythm.     Pulses: Normal pulses.     Heart sounds: Normal heart sounds.  Pulmonary:     Effort: Pulmonary effort is normal.     Breath sounds: Normal breath sounds.  Abdominal:     General: Bowel sounds are decreased. There is distension.     Palpations: There is no mass.     Tenderness: There is generalized abdominal tenderness. There is guarding.     Hernia: No hernia is present.  Musculoskeletal:     Right lower leg: No edema.     Left lower leg: No edema.  Skin:    General: Skin is warm.  Neurological:     General: No focal deficit present.     Mental Status: She is alert and oriented to person, place, and time.     Cranial Nerves: Cranial nerves 2-12 are intact.     Motor: Motor function is intact.  Psychiatric:        Attention and Perception: Attention normal.        Mood and Affect: Mood normal.        Speech: Speech normal.        Behavior: Behavior normal. Behavior is cooperative.        Cognition and Memory: Cognition normal.     Data Reviewed: Results for orders placed or performed during the hospital encounter of 05/18/22 (from the past 24 hour(s))  Lipase, blood     Status: Abnormal   Collection Time: 05/18/22  7:13 PM  Result Value Ref Range   Lipase 93 (H) 11 - 51 U/L  Comprehensive metabolic panel     Status: Abnormal   Collection Time: 05/18/22  7:13 PM  Result Value Ref Range   Sodium 134 (L) 135 - 145 mmol/L   Potassium 3.0 (L) 3.5 - 5.1 mmol/L   Chloride 96 (L) 98 - 111 mmol/L   CO2 27 22 - 32 mmol/L   Glucose, Bld 109 (H) 70 - 99 mg/dL   BUN 10 8 - 23 mg/dL   Creatinine, Ser 0.49 0.44 - 1.00 mg/dL   Calcium 9.4 8.9 - 10.3 mg/dL   Total Protein 7.2 6.5 - 8.1 g/dL    Albumin 4.2 3.5 - 5.0 g/dL   AST 30 15 - 41 U/L   ALT 27 0 - 44 U/L   Alkaline Phosphatase 42 38 - 126 U/L   Total Bilirubin 0.7 0.3 - 1.2 mg/dL   GFR, Estimated >60 >60  mL/min   Anion gap 11 5 - 15  CBC     Status: None   Collection Time: 05/18/22  7:13 PM  Result Value Ref Range   WBC 8.0 4.0 - 10.5 K/uL   RBC 4.73 3.87 - 5.11 MIL/uL   Hemoglobin 14.1 12.0 - 15.0 g/dL   HCT 41.6 36.0 - 46.0 %   MCV 87.9 80.0 - 100.0 fL   MCH 29.8 26.0 - 34.0 pg   MCHC 33.9 30.0 - 36.0 g/dL   RDW 12.8 11.5 - 15.5 %   Platelets 336 150 - 400 K/uL   nRBC 0.0 0.0 - 0.2 %  Urinalysis, Routine w reflex microscopic     Status: Abnormal   Collection Time: 05/18/22  7:13 PM  Result Value Ref Range   Color, Urine YELLOW (A) YELLOW   APPearance HAZY (A) CLEAR   Specific Gravity, Urine 1.024 1.005 - 1.030   pH 7.0 5.0 - 8.0   Glucose, UA NEGATIVE NEGATIVE mg/dL   Hgb urine dipstick NEGATIVE NEGATIVE   Bilirubin Urine NEGATIVE NEGATIVE   Ketones, ur NEGATIVE NEGATIVE mg/dL   Protein, ur NEGATIVE NEGATIVE mg/dL   Nitrite NEGATIVE NEGATIVE   Leukocytes,Ua NEGATIVE NEGATIVE  Magnesium     Status: Abnormal   Collection Time: 05/18/22  7:15 PM  Result Value Ref Range   Magnesium 2.5 (H) 1.7 - 2.4 mg/dL     Assessment and Plan: * Abdominal pain Progressive abdominal pain along with nausea and vomiting. Patient also remains reports constipation. CT imaging shows partial large bowel obstruction and general surgery Dr. Hampton Abbot has been consulted by ED provider. Per report general surgery recommended enema as and n.p.o. after midnight. During pt's last colonoscopy, pt has incomplete c-scopy and was to have repeat one but due to covid did not have one.   Essential hypertension Blood pressure (!) 150/80, pulse 73, temperature 98.3 F (36.8 C), resp. rate 20, height '5\' 7"'$  (1.702 m), weight 74.8 kg, SpO2 100 %. scheduled hydralazine. hold po meds.   Gastroesophageal reflux disease Iv ppi. pt has had  EGD showing hiatal hernia.   Asthma Stable clinically and on exam no wheezing. PRN albuterol .  Hypokalemia Will replace and follow.      Advance Care Planning:    Code Status: Full Code    Consults:  General surgery: Dr. Hampton Abbot.   Family Communication:  Neysha, Criado (Spouse)  [phone] (Mobile)  Severity of Illness: The appropriate patient status for this patient is OBSERVATION. Observation status is judged to be reasonable and necessary in order to provide the required intensity of service to ensure the patient's safety. The patient's presenting symptoms, physical exam findings, and initial radiographic and laboratory data in the context of their medical condition is felt to place them at decreased risk for further clinical deterioration. Furthermore, it is anticipated that the patient will be medically stable for discharge from the hospital within 2 midnights of admission.   Author: Para Skeans, MD 05/18/2022 11:51 PM  For on call review [email or web address].

## 2022-05-18 NOTE — ED Provider Notes (Signed)
[facility] Provider Note    Event Date/Time   First MD Initiated Contact with Patient 05/18/22 2114     (approximate)   History   Abdominal Pain and Constipation   HPI  Clarisa Danser is a 61 y.o. female past medical history of mixed connective tissue disease presents with abdominal pain.  Patient has had no bowel movement last 3 days which is abnormal for her and usually she is regular.  Today started having generalized abdominal pain that is constant and severe.  Also multiple episodes of emesis inability to tolerate p.o.  Denies urinary symptoms.  Had her gallbladder removed hysterectomy and hernia repair in the past.  Had a colonoscopy in the past but said that the gastroenterologist could not get behind a certain point and did not have a second colonoscopy after this.     Past Medical History:  Diagnosis Date   Asthma    Autoimmune disease ([place])    Cancer ([place])    Endometrial   H/O blood clots    Hypertension     There are no problems to display for this patient.    Physical Exam  Triage Vital Signs: ED Triage Vitals [05/18/22 1910]  Enc Vitals Group     BP (!) 147/91     Pulse Rate 94     Resp 18     Temp 98.2 F (36.8 C)     Temp Source Oral     SpO2 96 %     Weight 165 lb (74.8 kg)     Height '5\' 7"'$  (1.702 m)     Head Circumference      Peak Flow      Pain Score      Pain Loc      Pain Edu?      Excl. in [place]?     Most recent vital signs: Vitals:   05/18/22 1910  BP: (!) 147/91  Pulse: 94  Resp: 18  Temp: 98.2 F (36.8 C)  SpO2: 96%     General: Awake, patient is standing up next to the stretcher holding her abdomen looks uncomfortable CV:  Good peripheral perfusion.  Resp:  Normal effort.  Abd:  No distention.  Mild tenderness to palpation throughout no guarding Neuro:             Awake, Alert, Oriented x 3  Other:     ED Results / Procedures / Treatments  Labs (all labs ordered are listed, but only abnormal  results are displayed) Labs Reviewed  LIPASE, BLOOD - Abnormal; Notable for the following components:      Result Value   Lipase 93 (*)    All other components within normal limits  COMPREHENSIVE METABOLIC PANEL - Abnormal; Notable for the following components:   Sodium 134 (*)    Potassium 3.0 (*)    Chloride 96 (*)    Glucose, Bld 109 (*)    All other components within normal limits  URINALYSIS, ROUTINE W REFLEX MICROSCOPIC - Abnormal; Notable for the following components:   Color, Urine YELLOW (*)    APPearance HAZY (*)    All other components within normal limits  MAGNESIUM - Abnormal; Notable for the following components:   Magnesium 2.5 (*)    All other components within normal limits  CBC     EKG     RADIOLOGY I reviewed and interpreted the CT abdomen pelvis which shows a partial large bowel obstruction no free air  PROCEDURES:  Critical Care performed: No  Procedures  The patient is on the cardiac monitor to evaluate for evidence of arrhythmia and/or significant heart rate changes.   MEDICATIONS ORDERED IN ED: Medications  HYDROmorphone (DILAUDID) injection 1 mg (has no administration in time range)  potassium chloride 10 mEq in 100 mL IVPB (has no administration in time range)  lactated ringers bolus 1,000 mL (1,000 mLs Intravenous New Bag/Given 05/18/22 2017)  ondansetron (ZOFRAN) injection 4 mg (4 mg Intravenous Given 05/18/22 2014)  iohexol (OMNIPAQUE) 300 MG/ML solution 100 mL (100 mLs Intravenous Contrast Given 05/18/22 2039)     IMPRESSION / MDM / ASSESSMENT AND PLAN / ED COURSE  I reviewed the triage vital signs and the nursing notes.                              Patient's presentation is most consistent with acute presentation with potential threat to life or bodily function.  Differential diagnosis includes, but is not limited to, SBO, LBO, perforated viscus, volvulus, biliary colic, pancreatitis    The patient is a 61 year old female  presenting with abdominal pain constipation nausea vomiting.  She appears uncomfortable on arrival is holding her abdomen prefers to stand up.  She is diffusely tender but nonperitoneal.  ABCs reassuring she has no leukocytosis no anemia.  CMP showing hypokalemia with a potassium of 3 mag is within normal limits.  UA is negative.  CT abdomen and pelvis obtained given concern for the above demonstrates a partial LBO with transition point in the sigmoid colon that could be possible postinflammatory stricture versus mass.  Discussed with Dr. Hampton Abbot with general surgery.  Recommends patient be n.p.o. and continue enemas from below to try to relieve some the constipation.  He will see her in the morning.  Went to admit to the hospitalist service.       FINAL CLINICAL IMPRESSION(S) / ED DIAGNOSES   Final diagnoses:  Large bowel obstruction ([place])  Hypokalemia     Rx / DC Orders   ED Discharge Orders     None        Note:  This document was prepared using Dragon voice recognition software and may include unintentional dictation errors.   Rada Hay, MD 05/18/22 2151

## 2022-05-18 NOTE — ED Triage Notes (Signed)
Pt c/o lower abdominal pain with N/V x1 day. Pt reports no BM in 3 days. Denies narcotic use and sts has been unsuccessful with OTC meds for constipation.

## 2022-05-18 NOTE — Assessment & Plan Note (Signed)
Blood pressure (!) 150/80, pulse 73, temperature 98.3 F (36.8 C), resp. rate 20, height '5\' 7"'$  (1.702 m), weight 74.8 kg, SpO2 100 %. scheduled hydralazine. hold po meds.

## 2022-05-18 NOTE — Assessment & Plan Note (Signed)
Stable clinically and on exam no wheezing. PRN albuterol .

## 2022-05-18 NOTE — Assessment & Plan Note (Addendum)
Progressive abdominal pain along with nausea and vomiting. Patient also remains reports constipation. CT imaging shows partial large bowel obstruction and general surgery Dr. Hampton Abbot has been consulted by ED provider. Per report general surgery recommended enema as and n.p.o. after midnight. During pt's last colonoscopy, pt has incomplete c-scopy and was to have repeat one but due to covid did not have one.

## 2022-05-18 NOTE — ED Provider Triage Note (Signed)
Emergency Medicine Provider Triage Evaluation Note  Elizabeth Martin, a 61 y.o. female  was evaluated in triage.  Pt complains of abdominal pain and patient x3 days.  Patient with history of autoimmune disorder and ANA positive presents with abdominal discomfort and bloating.  Patient had some nausea and vomiting with onset yesterday, but has not had decreased symptoms with Zofran.  She denies any ongoing fevers, chills, chest pain, shortness of breath.  Review of Systems  Positive: Abd pain, constipation Negative: FCS  Physical Exam  BP (!) 147/91 (BP Location: Left Arm)   Pulse 94   Temp 98.2 F (36.8 C) (Oral)   Resp 18   Ht '5\' 7"'$  (1.702 m)   Wt 74.8 kg   SpO2 96%   BMI 25.84 kg/m  Gen:   Awake, no distress  uncomfortable Resp:  Normal effort  MSK:   Moves extremities without difficulty  Other:  Soft, nontender. Decreased bowel sounds  Medical Decision Making  Medically screening exam initiated at 7:15 PM.  Appropriate orders placed.  Elizabeth Martin was informed that the remainder of the evaluation will be completed by another provider, this initial triage assessment does not replace that evaluation, and the importance of remaining in the ED until their evaluation is complete.  Patient to the ED for evaluation of generalized abdominal pain, bloating, and constipation for 3 days.   Elizabeth Needles, PA-C 05/18/22 1916

## 2022-05-18 NOTE — Assessment & Plan Note (Signed)
Will replace and follow 

## 2022-05-18 NOTE — Progress Notes (Signed)
05/18/22  Patient discussed with Dr. Starleen Blue.  Patient presents with 3 day history of constipation and abdominal pain.  She usually has daily bowel movements.  Workup in the ED shows hypokalemia of 3 and some hyponatremia/hypochloremia, but normal WBC.  CT scan showed partial large bowel obstruction with an area of narrowing in the sigmoid colon.  Uncertain if this is diverticular stricture vs malignancy.  She had a colonoscopy in 02/2020 and report mentioned an aborted colonoscopy as the scope could not be passed past the descending colon due to significant redundancy and looping, but without mention of stricture.  There were some polyps removed which were tubular adenoma.  Since on CT it appears to be a partial large bowel obstruction, no evidence of impending perforation, recommend admission to medical team for workup of the area of narrowing.  Would need GI consult for possible colonoscopy.  May try enemas, but would defer any po bowel regimen for now unless ok with GI.  Patient may need surgery this admission.  She does have a possible connective tissue disorder, positive ANA and is on plaquenil and azathioprine.  Full consult note to follow in AM.  Olean Ree, MD

## 2022-05-18 NOTE — Assessment & Plan Note (Signed)
Iv ppi. pt has had EGD showing hiatal hernia.

## 2022-05-19 ENCOUNTER — Encounter
Admission: EM | Disposition: A | Discharge status: Home / Self Care | Payer: Self-pay | Source: Home / Self Care | Attending: Internal Medicine

## 2022-05-19 ENCOUNTER — Inpatient Hospital Stay: Payer: BC Managed Care – PPO

## 2022-05-19 ENCOUNTER — Inpatient Hospital Stay: Payer: BC Managed Care – PPO | Admitting: Anesthesiology

## 2022-05-19 ENCOUNTER — Other Ambulatory Visit: Payer: Self-pay

## 2022-05-19 ENCOUNTER — Encounter: Payer: Self-pay | Admitting: Internal Medicine

## 2022-05-19 ENCOUNTER — Ambulatory Visit: Payer: BC Managed Care – PPO | Admitting: Occupational Therapy

## 2022-05-19 DIAGNOSIS — K56609 Unspecified intestinal obstruction, unspecified as to partial versus complete obstruction: Secondary | ICD-10-CM | POA: Insufficient documentation

## 2022-05-19 DIAGNOSIS — K56699 Other intestinal obstruction unspecified as to partial versus complete obstruction: Secondary | ICD-10-CM

## 2022-05-19 DIAGNOSIS — R1084 Generalized abdominal pain: Secondary | ICD-10-CM | POA: Diagnosis not present

## 2022-05-19 HISTORY — PX: FLEXIBLE SIGMOIDOSCOPY: SHX5431

## 2022-05-19 LAB — COMPREHENSIVE METABOLIC PANEL
ALT: 28 U/L (ref 0–44)
AST: 35 U/L (ref 15–41)
Albumin: 3.7 g/dL (ref 3.5–5.0)
Alkaline Phosphatase: 38 U/L (ref 38–126)
Anion gap: 5 (ref 5–15)
BUN: 8 mg/dL (ref 8–23)
CO2: 30 mmol/L (ref 22–32)
Calcium: 8.8 mg/dL — ABNORMAL LOW (ref 8.9–10.3)
Chloride: 101 mmol/L (ref 98–111)
Creatinine, Ser: 0.32 mg/dL — ABNORMAL LOW (ref 0.44–1.00)
GFR, Estimated: >60 mL/min (ref >60)
Glucose, Bld: 121 mg/dL — ABNORMAL HIGH (ref 70–99)
Potassium: 2.7 mmol/L — CL (ref 3.5–5.1)
Sodium: 136 mmol/L (ref 135–145)
Total Bilirubin: 0.7 mg/dL (ref 0.3–1.2)
Total Protein: 6.5 g/dL (ref 6.5–8.1)

## 2022-05-19 LAB — POTASSIUM: Potassium: 3.7 mmol/L (ref 3.5–5.1)

## 2022-05-19 LAB — CBC
HCT: 37.3 % (ref 36.0–46.0)
Hemoglobin: 12.5 g/dL (ref 12.0–15.0)
MCH: 29.5 pg (ref 26.0–34.0)
MCHC: 33.5 g/dL (ref 30.0–36.0)
MCV: 88 fL (ref 80.0–100.0)
Platelets: 309 10*3/uL (ref 150–400)
RBC: 4.24 MIL/uL (ref 3.87–5.11)
RDW: 13 % (ref 11.5–15.5)
WBC: 9.6 10*3/uL (ref 4.0–10.5)
nRBC: 0 % (ref 0.0–0.2)

## 2022-05-19 LAB — LACTIC ACID, PLASMA: Lactic Acid, Venous: 1.5 mmol/L (ref 0.5–1.9)

## 2022-05-19 LAB — HIV ANTIBODY (ROUTINE TESTING W REFLEX): HIV Screen 4th Generation wRfx: NONREACTIVE

## 2022-05-19 LAB — GAMMA GT: GGT: 19 U/L (ref 7–50)

## 2022-05-19 SURGERY — SIGMOIDOSCOPY, FLEXIBLE
Anesthesia: General

## 2022-05-19 MED ORDER — PROPOFOL 500 MG/50ML IV EMUL
INTRAVENOUS | Status: DC | PRN
  Administered 2022-05-19: 150 ug/kg/min via INTRAVENOUS

## 2022-05-19 MED ORDER — FLEET ENEMA 7-19 GM/118ML RE ENEM: 2.0000 | ENEMA | RECTAL | Status: DC

## 2022-05-19 MED ORDER — SENNOSIDES-DOCUSATE SODIUM 8.6-50 MG PO TABS
2.0000 | ORAL_TABLET | Freq: Two times a day (BID) | ORAL | Status: DC
  Administered 2022-05-19 – 2022-05-23 (×9): 2 via ORAL
  Filled 2022-05-19 (×11): qty 2

## 2022-05-19 MED ORDER — MINERAL OIL RE ENEM: 1.0000 | ENEMA | RECTAL | Status: DC | PRN

## 2022-05-19 MED ORDER — PROCHLORPERAZINE EDISYLATE 10 MG/2ML IJ SOLN
10.0000 mg | Freq: Four times a day (QID) | INTRAMUSCULAR | Status: DC | PRN
  Administered 2022-05-19 – 2022-05-26 (×4): 10 mg via INTRAVENOUS
  Filled 2022-05-19 (×5): qty 2

## 2022-05-19 MED ORDER — POTASSIUM CHLORIDE 10 MEQ/100ML IV SOLN
10.0000 meq | INTRAVENOUS | Status: AC
  Administered 2022-05-19 (×6): 10 meq via INTRAVENOUS
  Filled 2022-05-19 (×5): qty 100

## 2022-05-19 MED ORDER — PROPOFOL 10 MG/ML IV BOLUS
INTRAVENOUS | Status: DC | PRN
  Administered 2022-05-19 (×2): 50 mg via INTRAVENOUS

## 2022-05-19 MED ORDER — LACTULOSE 10 GM/15ML PO SOLN
20.0000 g | Freq: Three times a day (TID) | ORAL | Status: DC
  Administered 2022-05-19 – 2022-05-23 (×10): 20 g via ORAL
  Filled 2022-05-19 (×14): qty 30

## 2022-05-19 MED ORDER — ONDANSETRON HCL 4 MG/2ML IJ SOLN
4.0000 mg | Freq: Once | INTRAMUSCULAR | Status: AC
  Administered 2022-05-19: 4 mg via INTRAVENOUS
  Filled 2022-05-19: qty 2

## 2022-05-19 MED ORDER — LACTULOSE 10 GM/15ML PO SOLN
20.0000 g | Freq: Once | ORAL | Status: AC
  Administered 2022-05-19: 20 g via ORAL
  Filled 2022-05-19: qty 30

## 2022-05-19 MED ORDER — ONDANSETRON HCL 4 MG/2ML IJ SOLN
4.0000 mg | Freq: Four times a day (QID) | INTRAMUSCULAR | Status: DC | PRN
  Administered 2022-05-26: 4 mg via INTRAVENOUS
  Filled 2022-05-19 (×3): qty 2

## 2022-05-19 MED ORDER — KETOROLAC TROMETHAMINE 30 MG/ML IJ SOLN
30.0000 mg | Freq: Four times a day (QID) | INTRAMUSCULAR | Status: AC | PRN
  Administered 2022-05-19 – 2022-05-21 (×3): 30 mg via INTRAVENOUS
  Filled 2022-05-19 (×3): qty 1

## 2022-05-19 MED ORDER — SODIUM CHLORIDE 0.9 % IV SOLN: INTRAVENOUS | Status: DC

## 2022-05-19 NOTE — Transfer of Care (Signed)
Immediate Anesthesia Transfer of Care Note  Patient: Elizabeth Martin  Procedure(s) Performed: FLEXIBLE SIGMOIDOSCOPY  Patient Location: Endoscopy Unit  Anesthesia Type:General  Level of Consciousness: awake  Airway & Oxygen Therapy: Patient Spontanous Breathing  Post-op Assessment: Report given to RN and Post -op Vital signs reviewed and stable  Post vital signs: Reviewed and stable  Last Vitals:  Vitals Value Taken Time  BP 96/52 05/19/22 1451  Temp 35.9 C 05/19/22 1451  Pulse 89 05/19/22 1458  Resp 18 05/19/22 1458  SpO2 94 % 05/19/22 1458  Vitals shown include unvalidated device data.  Last Pain:  Vitals:   05/19/22 1451  TempSrc: Temporal  PainSc: Asleep         Complications: No notable events documented.

## 2022-05-19 NOTE — Consult Note (Signed)
[facility] SURGICAL CONSULTATION NOTE (initial) - cpt: 54627 (Outpatient/ED)   HISTORY OF PRESENT ILLNESS (HPI):  61 y.o. female presented to [facility] ED last night for evaluation of abdominal pain and constipation. Patient reports that she has been without any bowel movements over the last 3 days or so, which is very irregular for her. She also noted the acute onset of generalized abdominal pain as well in the last 24 hours. She described this as severe in nature and has been constant. Endorses nausea and emesis as well. No fever, chills, CP, SOB, or urinary changes. No history of similar in the past. Previous abdominal surgeries include hysterectomy, hernia repair, and cholecystectomy. She does have a previous colonoscopy in 02/2020 and report mentioned an aborted colonoscopy as the scope could not be passed past the descending colon due to significant redundancy and looping, but without mention of stricture.  There were some polyps removed which were tubular adenoma. Work up in the ED revealed hypokalemia to 3.0 (now 2.7) but labs were otherwise reassuring. She underwent CT Abdomen/Pelvis which showed partial large bowel obstruction with stool filled colon, area of narrowing at the sigmoid colon, unclear if this is stricture vs mass. She was admitted to admitted to the medicine service.   Surgery is consulted by emergency medicine physician Dr. Acquanetta Belling, MD in this context for evaluation and management of partial large bowel obstruction and narrowing of the sigmoid colon.  PAST MEDICAL HISTORY (PMH):  Past Medical History:  Diagnosis Date   Asthma    Autoimmune disease ([place])    Cancer ([place])    Endometrial   H/O blood clots    Hypertension      PAST SURGICAL HISTORY ([place]):  Past Surgical History:  Procedure Laterality Date   ABDOMINAL HYSTERECTOMY     BASAL CELL CARCINOMA EXCISION Left    CHOLECYSTECTOMY     HERNIA REPAIR     PLANTAR FASCIECTOMY     SHOULDER SURGERY  Left    TONSILLECTOMY       MEDICATIONS:  Prior to Admission medications   Medication Sig Start Date End Date Taking? Authorizing Provider  albuterol (VENTOLIN HFA) 108 (90 Base) MCG/ACT inhaler Inhale 2 puffs into the lungs every 6 (six) hours as needed. 01/11/22   [provider]  cetirizine (ZYRTEC) 10 MG tablet Take 10 mg by mouth daily. 08/10/21   [provider]  chlorthalidone (HYGROTON) 50 MG tablet Take 50 mg by mouth every morning. 11/08/21   [provider]  clopidogrel (PLAVIX) 75 MG tablet Take 1 tablet (75 mg total) by mouth daily. 03/31/22 03/31/23  Menshew, Dannielle Karvonen, PA-C  DULoxetine (CYMBALTA) 30 MG capsule Take 90 mg by mouth daily. 11/16/21   [provider]  hydroxychloroquine (PLAQUENIL) 200 MG tablet Take 200 mg by mouth 2 (two) times daily. 01/11/22   [provider]  lisinopril (ZESTRIL) 20 MG tablet Take 1 tablet by mouth daily. 02/15/17   [provider]  Multiple Vitamin (MULTIVITAMIN) capsule Take 1 capsule by mouth daily.    [provider]     ALLERGIES:  Allergies  Allergen Reactions   Carboplatin Anaphylaxis    ANAPHYLACTIC REACTION ANAPHYLACTIC REACTION    Nitrofurantoin Nausea Only and Nausea And Vomiting   Silicone Itching and Rash   Wheat Bran Other (See Comments)    Other reaction(s): Other (See Comments) Body aches and swelling Body aches and swelling    Fentanyl Hives, Itching and Rash     SOCIAL  HISTORY:  Social History   Socioeconomic History   Marital status: Married    Spouse name: Not on file   Number of children: Not on file   Years of education: Not on file   Highest education level: Not on file  Occupational History   Not on file  Tobacco Use   Smoking status: Never   Smokeless tobacco: Never  Substance and Sexual Activity   Alcohol use: Yes    Comment: occ.   Drug use: Never   Sexual activity: Not on file  Other Topics Concern   Not on file  Social History  Narrative   ** Merged History Encounter **       Social Determinants of Health   Financial Resource Strain: Not on file  Food Insecurity: Not on file  Transportation Needs: Not on file  Physical Activity: Not on file  Stress: Not on file  Social Connections: Not on file  Intimate Partner Violence: Not on file     FAMILY HISTORY:  History reviewed. No pertinent family history.    REVIEW OF SYSTEMS:  Review of Systems  Constitutional:  Negative for chills and fever.  HENT:  Negative for congestion and sore throat.   Respiratory:  Negative for cough and shortness of breath.   Cardiovascular:  Negative for chest pain and palpitations.  Gastrointestinal:  Positive for abdominal pain, constipation, nausea and vomiting. Negative for blood in stool and diarrhea.  Genitourinary:  Negative for dysuria and urgency.  All other systems reviewed and are negative.   VITAL SIGNS:  Temp:  [98.2 F (36.8 C)-98.3 F (36.8 C)] 98.3 F (36.8 C) (06/14 0419) Pulse Rate:  [73-94] 91 (06/14 0419) Resp:  [18-20] 20 (06/14 0419) BP: (135-150)/(75-93) 135/75 (06/14 0419) SpO2:  [96 %-100 %] 96 % (06/14 0419) Weight:  [74.8 kg-78.5 kg] 78.5 kg (06/14 0651)     Height: '5\' 7"'$  (170.2 cm) Weight: 78.5 kg BMI (Calculated): 27.1   INTAKE/OUTPUT:  06/13 0701 - 06/14 0700 In: 1098.8 [IV Piggyback:1098.8] Out: -   PHYSICAL EXAM:  Physical Exam Vitals and nursing note reviewed. Exam conducted with a chaperone present.  Constitutional:      General: She is not in acute distress.    Appearance: She is well-developed and normal weight. She is not ill-appearing.     Comments: Patient sitting on edge of bed, husband at bedside, wretching  Eyes:     General: No scleral icterus.    Extraocular Movements: Extraocular movements intact.  Cardiovascular:     Rate and Rhythm: Normal rate and regular rhythm.     Heart sounds: Normal heart sounds.  Pulmonary:     Effort: Pulmonary effort is normal. No  respiratory distress.     Breath sounds: Normal breath sounds.  Abdominal:     General: A surgical scar is present. There is distension.     Palpations: Abdomen is soft.     Tenderness: There is abdominal tenderness in the left lower quadrant. There is no guarding or rebound.     Hernia: No hernia is present.     Comments: Abdomen is soft, tenderness on left side, some distension, no rebound/guarding. No  overt evidence of peritonitis   Genitourinary:    Comments: Deferred Skin:    General: Skin is warm and dry.     Coloration: Skin is not jaundiced or pale.  Neurological:     General: No focal deficit present.     Mental Status: She is alert and oriented  to person, place, and time.  Psychiatric:        Mood and Affect: Mood normal.        Behavior: Behavior normal.      Labs:     Latest Ref Rng & Units 05/19/2022    3:24 AM 05/18/2022    7:13 PM 03/31/2022    7:28 PM  CBC  WBC 4.0 - 10.5 K/uL 9.6  8.0  9.4   Hemoglobin 12.0 - 15.0 g/dL 12.5  14.1  13.7   Hematocrit 36.0 - 46.0 % 37.3  41.6  41.3   Platelets 150 - 400 K/uL 309  336  393       Latest Ref Rng & Units 05/19/2022    3:24 AM 05/18/2022    7:13 PM 03/31/2022    7:28 PM  CMP  Glucose 70 - 99 mg/dL 121  109  128   BUN 8 - 23 mg/dL '8  10  15   '$ Creatinine 0.44 - 1.00 mg/dL 0.32  0.49  0.62   Sodium 135 - 145 mmol/L 136  134  136   Potassium 3.5 - 5.1 mmol/L 2.7  3.0  3.3   Chloride 98 - 111 mmol/L 101  96  97   CO2 22 - 32 mmol/L '30  27  29   '$ Calcium 8.9 - 10.3 mg/dL 8.8  9.4  9.3   Total Protein 6.5 - 8.1 g/dL 6.5  7.2    Total Bilirubin 0.3 - 1.2 mg/dL 0.7  0.7    Alkaline Phos 38 - 126 U/L 38  42    AST 15 - 41 U/L 35  30    ALT 0 - 44 U/L 28  27       Imaging studies:   CT Abdomen/Pelvis (05/18/2022) personally reviewed which showed partial large bowel obstruction with stool filled colon, area of narrowing at the sigmoid colon, unclear if this is stricture vs mass, no free air, no abscess, and  radiologist report reviewed below:  IMPRESSION: Partial large bowel obstruction with large volume stool within the ascending, transverse, and descending colon with a point of transition identified within the proximal sigmoid colon. This may represent a post inflammatory stricture, however, an infiltrative mass as can be seen with a primary colonic malignancy is not excluded. Further evaluation with colonoscopy is recommended.   Small hiatal hernia   Aortic Atherosclerosis (ICD10-I70.0).   Assessment/Plan: (ICD-10's: K97.609) 61 y.o. female with abdominal pain, nausea, emesis, and constipation found to have narrowing at the sigmoid colon concerning for stricture vs mass resulting in partial large bowel obstruction   - Would recommend GI consultation to determine is sigmoidoscopy is possible to obtain diagnosis.   - She may require surgical intervention this admission and likely end ostomy vs diverting ostomy. She is passing flatus and there is no evidence of complete obstruction at this time. If she is able to have colonoscopy and empty her bowels, we could pursue this in outpatient setting as well, although this is less likely.   - NPO + IVF Resuscitation - Monitor abdominal examination; on-going bowel function - Pain control prn; antiemetic prn - Mobilization as tolerated   - Further management per primary service; we will follow   All of the above findings and recommendations were discussed with the patient and her family, and all of patient's and her family's questions were answered to their expressed satisfaction.  Thank you for the opportunity to participate in this patient's care.   -- Alroy Dust  Freda Jackson [facility] 05/19/2022, 7:20 AM M-F: 7am - 4pm

## 2022-05-19 NOTE — Op Note (Signed)
[facility] Patient Name: Elizabeth Martin Procedure Date: 05/19/2022 1:38 PM MRN: [id number] Account #: [id number] Date of Birth: Oct 01, 1961 Admit Type: Inpatient Age: 61 Room: [facility] ENDO ROOM 2 Gender: Female Note Status: Finalized Instrument Name: Upper Endoscope [id number] Procedure:             Flexible Sigmoidoscopy Indications:           Abnormal CT of the GI tract, Weight loss, Fecal                         impaction Providers:             Benay Pike. Alice Reichert MD, MD Referring MD:          Mercie Eon, MD Medicines:             Propofol per Anesthesia Complications:         No immediate complications. Procedure:             Pre-Anesthesia Assessment:                        - The risks and benefits of the procedure and the                         sedation options and risks were discussed with the                         patient. All questions were answered and informed                         consent was obtained.                        - Patient identification and proposed procedure were                         verified prior to the procedure by the nurse. The                         procedure was verified in the procedure room.                        - ASA Grade Assessment: III - A patient with severe                         systemic disease.                        - After reviewing the risks and benefits, the patient                         was deemed in satisfactory condition to undergo the                         procedure.                        After obtaining informed consent, the scope was passed                         under direct  vision. The Endoscope was introduced                         through the anus The procedure was aborted. The scope                         was not inserted. descending colon Medications were                         sigmoid colon. The flexible sigmoidoscopy was                         technically difficult  and complex due to abnormal                         anatomy, multiple diverticula in the colon and bowel                         stenosis. The quality of the bowel preparation was                         poor. The flexible sigmoidoscopy was aborted due to                         the difficulty of the procedure. Findings:      The perianal and digital rectal examinations were normal. Pertinent       negatives include normal sphincter tone and no palpable rectal lesions.      There was gross spasm in the mid sigmoid colon.      Multiple small and large-mouthed diverticula were found in the sigmoid       colon. There was no evidence of diverticular bleeding.      Extensive amounts of solid stool was found in the mid sigmoid colon,       precluding visualization. Lavage of the area was performed using a       moderate amount of sterile water, resulting in incomplete clearance with       continued poor visualization.      Procedure aborted at this point and scope was withdrawn Impression:            - Preparation of the colon was poor.                        - The procedure was aborted due to the difficulty of                         the procedure.                        - Gross colonic spasm.                        - Severe diverticulosis in the sigmoid colon. There                         was no evidence of diverticular bleeding.                        - Stool in the mid sigmoid colon.                        -  No specimens collected.                        - Fecal impaction of the sigmoid. Colonic mucosa                         appeared otherwise normal. Recommendation:        - Sigmoid colectomy when feasible.                        - The findings and recommendations were discussed with                         the patient. Procedure Code(s):     --- Professional ---                        [phone], 52, Sigmoidoscopy, flexible; diagnostic,                         including collection of  specimen(s) by brushing or                         washing, when performed (separate procedure) Diagnosis Code(s):     --- Professional ---                        R93.3, Abnormal findings on diagnostic imaging of                         other parts of digestive tract                        K57.30, Diverticulosis of large intestine without                         perforation or abscess without bleeding                        K56.41, Fecal impaction                        R63.4, Abnormal weight loss                        K58.9, Irritable bowel syndrome without diarrhea CPT copyright 2019 American Medical Association. All rights reserved. The codes documented in this report are preliminary and upon coder review may  be revised to meet current compliance requirements. Efrain Sella MD, MD 05/19/2022 2:57:00 PM This report has been signed electronically. Number of Addenda: 0 Note Initiated On: 05/19/2022 1:38 PM Total Procedure Duration: 0 hours 6 minutes 1 second  Estimated Blood Loss:  Estimated blood loss: none.      [facility]

## 2022-05-19 NOTE — H&P (View-Only) (Signed)
GI Inpatient Consult Note  Reason for Consult: Colonic stricture    Attending Requesting Consult: Dr. Sharen Hones  History of Present Illness: Elizabeth Martin is a 61 y.o. female seen for evaluation of partial large bowel obstruction with colonic stricture at the request of hospitalist - Dr. Sharen Hones. 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 She presented to the [facility] ED yesterday evening for chief complaint of 2-day history of worsening abdominal pain and constipation.  Symptoms started Monday mid-morning and started as colicky periumbilical and lower abdominal pain described as sharp in nature. Pain persisted and she began to develop abdominal distention, obstipation, nausea, and vomiting yesterday afternoon and decided to come to the ED. Upon presentation to the ED, she was hypertensive 147/91, HR 94, and otherwise normal vital signs. Labs were significant for potassium 3.0, sodium 134, BUN 10, serum creatinine 0.49, WBC 8.0, lipase 93, and negative UA. Contrasted CT scan commented on partial large bowel obstruction with large volume stool within the ascending, transverse, and descending colon with a transition point w/I proximal sigmoid colon. This could be 2/2 post-inflammatory stricture or infiltrative mass. General Surgery was consulted and recommended trial of enemas and GI consult before any surgical interventions.   Patient seen and examined this afternoon resting comfortably in hospital bed. Husband is present bedside and helps to contribute to history. No acute events overnight. She did have nausea and vomiting this morning with a lot of dry heaving. She has since received anti-emetic and pain medication and symptoms are better. She is not currently in any abdominal pain. She is passing very little gas. She still feels bloated. She denies any vomiting since 0830 this morning. Potassium was  2.7 this morning and has been repleted. Previous abdominal surgeries include hysterectomy, hernia repair, and cholecystectomy. She has no previous history of bowel obstructions. She denies any known history of diverticulitis. She had a screening colonoscopy performed at [facility] by Santa Lighter 02/2020 which showed four 2-4 mm polyps removed from rectum and sigmoid colon with path showing tubular adenoma and hyperplastic polyps, sigmoid diverticulosis, and procedure aborted in transverse colon due to redundant colon and significant looping. She was advised to have a CT colonography as follow-up, but this was never performed. She reports she didn't follow-up because of COVID-19 pandemic.    Summary of GI Procedures:  CSY 02/18/2020 - four 2-4 mm polyps removed from rectum and sigmoid colon with path showing tubular adenoma and hyperplastic polyps, sigmoid diverticulosis, procedure aborted in transverse colon due to redundant colon and significant looping  EGD 02/18/2020 - LA Grade B reflux esophagitis, medium-sized hiatal hernia, gastritis, normal examined duodenum    Past Medical History:  Past Medical History:  Diagnosis Date   Asthma    Autoimmune disease ([place])    Cancer ([place])    Endometrial   H/O blood clots    Hypertension     Problem List: Patient Active Problem List   Diagnosis Date Noted   Colonic stricture ([place]) 05/19/2022   Large bowel obstruction ([place]) 05/19/2022   Hypokalemia 05/18/2022   Abdominal pain 05/18/2022   Gastroesophageal reflux disease 02/15/2020   Essential hypertension 08/09/2017   Asthma 06/04/1969    Past Surgical History: Past Surgical History:  Procedure Laterality Date   ABDOMINAL HYSTERECTOMY     BASAL CELL CARCINOMA EXCISION Left    CHOLECYSTECTOMY     HERNIA REPAIR  PLANTAR FASCIECTOMY     SHOULDER SURGERY Left    TONSILLECTOMY      Allergies: Allergies  Allergen Reactions   Carboplatin Anaphylaxis    ANAPHYLACTIC REACTION ANAPHYLACTIC  REACTION    Nitrofurantoin Nausea Only and Nausea And Vomiting   Silicone Itching and Rash   Wheat Bran Other (See Comments)    Other reaction(s): Other (See Comments) Body aches and swelling Body aches and swelling    Fentanyl Hives, Itching and Rash    Home Medications: Medications Prior to Admission  Medication Sig Dispense Refill Last Dose   albuterol (VENTOLIN HFA) 108 (90 Base) MCG/ACT inhaler Inhale 2 puffs into the lungs every 6 (six) hours as needed.      cetirizine (ZYRTEC) 10 MG tablet Take 10 mg by mouth daily.      chlorthalidone (HYGROTON) 50 MG tablet Take 50 mg by mouth every morning.      clopidogrel (PLAVIX) 75 MG tablet Take 1 tablet (75 mg total) by mouth daily. 90 tablet 3    DULoxetine (CYMBALTA) 30 MG capsule Take 90 mg by mouth daily.      hydroxychloroquine (PLAQUENIL) 200 MG tablet Take 200 mg by mouth 2 (two) times daily.      lisinopril (ZESTRIL) 20 MG tablet Take 1 tablet by mouth daily.      Multiple Vitamin (MULTIVITAMIN) capsule Take 1 capsule by mouth daily.      Home medication reconciliation was completed with the patient.   Scheduled Inpatient Medications:    heparin  5,000 Units Subcutaneous Q8H   hydrALAZINE  10 mg Intravenous Q8H   pantoprazole (PROTONIX) IV  40 mg Intravenous Q12H   senna-docusate  2 tablet Oral BID   sodium chloride flush  3 mL Intravenous Q12H    Continuous Inpatient Infusions:    sodium chloride 50 mL/hr at 05/18/22 2252    PRN Inpatient Medications:  acetaminophen **OR** acetaminophen, ketorolac, morphine injection, ondansetron (ZOFRAN) IV, prochlorperazine  Family History: family history is not on file.  The patient's family history is negative for inflammatory bowel disorders, GI malignancy, or solid organ transplantation.  Social History:   reports that she has never smoked. She has never used smokeless tobacco. She reports current alcohol use. She reports that she does not use drugs. The patient denies  ETOH, tobacco, or drug use.   Review of Systems: Constitutional: Weight is stable.  Eyes: No changes in vision. ENT: No oral lesions, sore throat.  GI: see HPI.  Heme/Lymph: No easy bruising.  CV: No chest pain.  GU: No hematuria.  Integumentary: No rashes.  Neuro: No headaches.  Psych: No depression/anxiety.  Endocrine: No heat/cold intolerance.  Allergic/Immunologic: No urticaria.  Resp: No cough, SOB.  Musculoskeletal: No joint swelling.    Physical Examination: BP 135/72 (BP Location: Left Arm)   Pulse 88   Temp 98.1 F (36.7 C) (Oral)   Resp 18   Ht '5\' 7"'$  (1.702 m)   Wt 78.5 kg   SpO2 99%   BMI 27.11 kg/m  Gen: NAD, alert and oriented x 4 HEENT: PEERLA, EOMI, Neck: supple, no JVD or thyromegaly Chest: CTA bilaterally, no wheezes, crackles, or other adventitious sounds CV: RRR, no m/g/c/r Abd: mildly distended, hypoactive bowel sounds in all four quadrants; tender to light and deep palpation in LLQ and suprapubic regions, no HSM, guarding, ridigity, or rebound tenderness Ext: no edema, well perfused with 2+ pulses, Skin: no rash or lesions noted Lymph: no LAD  Data: Lab Results  Component  Value Date   WBC 9.6 05/19/2022   HGB 12.5 05/19/2022   HCT 37.3 05/19/2022   MCV 88.0 05/19/2022   PLT 309 05/19/2022   Recent Labs  Lab 05/18/22 1913 05/19/22 0324  HGB 14.1 12.5   Lab Results  Component Value Date   NA 136 05/19/2022   K 2.7 (LL) 05/19/2022   CL 101 05/19/2022   CO2 30 05/19/2022   BUN 8 05/19/2022   CREATININE 0.32 (L) 05/19/2022   Lab Results  Component Value Date   ALT 28 05/19/2022   AST 35 05/19/2022   GGT 19 05/18/2022   ALKPHOS 38 05/19/2022   BILITOT 0.7 05/19/2022   No results for input(s): "APTT", "INR", "PTT" in the last 168 hours.  CT abd/pelvis with contrast 05/18/2022: IMPRESSION: Partial large bowel obstruction with large volume stool within the ascending, transverse, and descending colon with a point of transition  identified within the proximal sigmoid colon. This may represent a post inflammatory stricture, however, an infiltrative mass as can be seen with a primary colonic malignancy is not excluded. Further evaluation with colonoscopy is recommended.   Small hiatal hernia   Aortic Atherosclerosis (ICD10-I70.0).  Assessment/Plan:  61 y/o female with a PMH of HTN, asthma, Hx of endometrial cancer, mixed connective tissue disease, and Hx of VTEs admitted to [facility] yesterday evening for 2-day history of abdominal pain and constipation found to have partial large bowel obstruction with area of narrowing in the sigmoid colon   Colonic stricture - CT with evidence of pLBO with large volume stool w/i ascending, transverse, and descending colon with transition point within proximal sigmoid colon. DDx includes post-inflammatory stricture from diverticular disease versus infiltrative colon mass  Partial large bowel obstruction - 2/2 #1  Hypokalemia - 2.7 this morning, repleted and now 3.7.   Constipation - 2/2 acute disease, will monitor   Recommendations:  - Continue supportive care with IV fluid hydration, pain control, and anti-emetics  - NPO status - Continue to replete electrolytes as needed per primary team - Continue serial abdominal examinations - Reviewed CT scan impression and diagnosis with patient and husband today in room. I reviewed that next step would be to undergo luminal evaluation so we can assess the colonic stricture and take biopsies of area to rule out post-inflammatory versus malignant stricture.  - Recommend flexible sigmoidoscopy this afternoon with Dr. Alice Reichert - Appreciate General Surgery assistance and recommendations. Surgery following.  - Further recommendations after procedure  I reviewed the risks (including bleeding, perforation, infection, anesthesia complications, cardiac/respiratory complications), benefits and alternatives of flexible sigmoidoscopy. Patient consents  to proceed.    Thank you for the consult. Please call with questions or concerns.  Geanie Kenning, PA-C [facility] [phone]

## 2022-05-19 NOTE — Hospital Course (Addendum)
Elizabeth Martin is a 61 y.o. female with medical history significant of asthma, endometrial cancer, history of VTE's and hypertension coming in with abdominal pain and constipation.  Patient has a history of abdominal pain going on for few days getting worse along with nausea and vomiting.  CT scan of abdomen/pelvis with contrast showed large bowel obstruction with a transition point at the sigmoid colon.  Patient has been seen by general surgery, suspect diverticulosis with stricture.  Patient had a partial sigmoidoscopy 6/14.  Showed sigmoid diverticulosis without diverticulitis.  Could not advance further.  However, patient was given multiple stool softener and a laxative, she started to have large amount of bowel movements.  Symptom has resolved since 6/15. Surgery is scheduling for sigmoidoscopy and a partial diversion ostomy.

## 2022-05-19 NOTE — Consult Note (Signed)
GI Inpatient Consult Note  Reason for Consult: Colonic stricture    Attending Requesting Consult: Dr. Sharen Hones  History of Present Illness: Elizabeth Martin is a 61 y.o. female seen for evaluation of partial large bowel obstruction with colonic stricture at the request of hospitalist - Dr. Sharen Hones. 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 She presented to the [facility] ED yesterday evening for chief complaint of 2-day history of worsening abdominal pain and constipation.  Symptoms started Monday mid-morning and started as colicky periumbilical and lower abdominal pain described as sharp in nature. Pain persisted and she began to develop abdominal distention, obstipation, nausea, and vomiting yesterday afternoon and decided to come to the ED. Upon presentation to the ED, she was hypertensive 147/91, HR 94, and otherwise normal vital signs. Labs were significant for potassium 3.0, sodium 134, BUN 10, serum creatinine 0.49, WBC 8.0, lipase 93, and negative UA. Contrasted CT scan commented on partial large bowel obstruction with large volume stool within the ascending, transverse, and descending colon with a transition point w/I proximal sigmoid colon. This could be 2/2 post-inflammatory stricture or infiltrative mass. General Surgery was consulted and recommended trial of enemas and GI consult before any surgical interventions.   Patient seen and examined this afternoon resting comfortably in hospital bed. Husband is present bedside and helps to contribute to history. No acute events overnight. She did have nausea and vomiting this morning with a lot of dry heaving. She has since received anti-emetic and pain medication and symptoms are better. She is not currently in any abdominal pain. She is passing very little gas. She still feels bloated. She denies any vomiting since 0830 this morning. Potassium was  2.7 this morning and has been repleted. Previous abdominal surgeries include hysterectomy, hernia repair, and cholecystectomy. She has no previous history of bowel obstructions. She denies any known history of diverticulitis. She had a screening colonoscopy performed at [facility] by Santa Lighter 02/2020 which showed four 2-4 mm polyps removed from rectum and sigmoid colon with path showing tubular adenoma and hyperplastic polyps, sigmoid diverticulosis, and procedure aborted in transverse colon due to redundant colon and significant looping. She was advised to have a CT colonography as follow-up, but this was never performed. She reports she didn't follow-up because of COVID-19 pandemic.    Summary of GI Procedures:  CSY 02/18/2020 - four 2-4 mm polyps removed from rectum and sigmoid colon with path showing tubular adenoma and hyperplastic polyps, sigmoid diverticulosis, procedure aborted in transverse colon due to redundant colon and significant looping  EGD 02/18/2020 - LA Grade B reflux esophagitis, medium-sized hiatal hernia, gastritis, normal examined duodenum    Past Medical History:  Past Medical History:  Diagnosis Date   Asthma    Autoimmune disease ([place])    Cancer ([place])    Endometrial   H/O blood clots    Hypertension     Problem List: Patient Active Problem List   Diagnosis Date Noted   Colonic stricture ([place]) 05/19/2022   Large bowel obstruction ([place]) 05/19/2022   Hypokalemia 05/18/2022   Abdominal pain 05/18/2022   Gastroesophageal reflux disease 02/15/2020   Essential hypertension 08/09/2017   Asthma 06/04/1969    Past Surgical History: Past Surgical History:  Procedure Laterality Date   ABDOMINAL HYSTERECTOMY     BASAL CELL CARCINOMA EXCISION Left    CHOLECYSTECTOMY     HERNIA REPAIR  PLANTAR FASCIECTOMY     SHOULDER SURGERY Left    TONSILLECTOMY      Allergies: Allergies  Allergen Reactions   Carboplatin Anaphylaxis    ANAPHYLACTIC REACTION ANAPHYLACTIC  REACTION    Nitrofurantoin Nausea Only and Nausea And Vomiting   Silicone Itching and Rash   Wheat Bran Other (See Comments)    Other reaction(s): Other (See Comments) Body aches and swelling Body aches and swelling    Fentanyl Hives, Itching and Rash    Home Medications: Medications Prior to Admission  Medication Sig Dispense Refill Last Dose   albuterol (VENTOLIN HFA) 108 (90 Base) MCG/ACT inhaler Inhale 2 puffs into the lungs every 6 (six) hours as needed.      cetirizine (ZYRTEC) 10 MG tablet Take 10 mg by mouth daily.      chlorthalidone (HYGROTON) 50 MG tablet Take 50 mg by mouth every morning.      clopidogrel (PLAVIX) 75 MG tablet Take 1 tablet (75 mg total) by mouth daily. 90 tablet 3    DULoxetine (CYMBALTA) 30 MG capsule Take 90 mg by mouth daily.      hydroxychloroquine (PLAQUENIL) 200 MG tablet Take 200 mg by mouth 2 (two) times daily.      lisinopril (ZESTRIL) 20 MG tablet Take 1 tablet by mouth daily.      Multiple Vitamin (MULTIVITAMIN) capsule Take 1 capsule by mouth daily.      Home medication reconciliation was completed with the patient.   Scheduled Inpatient Medications:    heparin  5,000 Units Subcutaneous Q8H   hydrALAZINE  10 mg Intravenous Q8H   pantoprazole (PROTONIX) IV  40 mg Intravenous Q12H   senna-docusate  2 tablet Oral BID   sodium chloride flush  3 mL Intravenous Q12H    Continuous Inpatient Infusions:    sodium chloride 50 mL/hr at 05/18/22 2252    PRN Inpatient Medications:  acetaminophen **OR** acetaminophen, ketorolac, morphine injection, ondansetron (ZOFRAN) IV, prochlorperazine  Family History: family history is not on file.  The patient's family history is negative for inflammatory bowel disorders, GI malignancy, or solid organ transplantation.  Social History:   reports that she has never smoked. She has never used smokeless tobacco. She reports current alcohol use. She reports that she does not use drugs. The patient denies  ETOH, tobacco, or drug use.   Review of Systems: Constitutional: Weight is stable.  Eyes: No changes in vision. ENT: No oral lesions, sore throat.  GI: see HPI.  Heme/Lymph: No easy bruising.  CV: No chest pain.  GU: No hematuria.  Integumentary: No rashes.  Neuro: No headaches.  Psych: No depression/anxiety.  Endocrine: No heat/cold intolerance.  Allergic/Immunologic: No urticaria.  Resp: No cough, SOB.  Musculoskeletal: No joint swelling.    Physical Examination: BP 135/72 (BP Location: Left Arm)   Pulse 88   Temp 98.1 F (36.7 C) (Oral)   Resp 18   Ht '5\' 7"'$  (1.702 m)   Wt 78.5 kg   SpO2 99%   BMI 27.11 kg/m  Gen: NAD, alert and oriented x 4 HEENT: PEERLA, EOMI, Neck: supple, no JVD or thyromegaly Chest: CTA bilaterally, no wheezes, crackles, or other adventitious sounds CV: RRR, no m/g/c/r Abd: mildly distended, hypoactive bowel sounds in all four quadrants; tender to light and deep palpation in LLQ and suprapubic regions, no HSM, guarding, ridigity, or rebound tenderness Ext: no edema, well perfused with 2+ pulses, Skin: no rash or lesions noted Lymph: no LAD  Data: Lab Results  Component  Value Date   WBC 9.6 05/19/2022   HGB 12.5 05/19/2022   HCT 37.3 05/19/2022   MCV 88.0 05/19/2022   PLT 309 05/19/2022   Recent Labs  Lab 05/18/22 1913 05/19/22 0324  HGB 14.1 12.5   Lab Results  Component Value Date   NA 136 05/19/2022   K 2.7 (LL) 05/19/2022   CL 101 05/19/2022   CO2 30 05/19/2022   BUN 8 05/19/2022   CREATININE 0.32 (L) 05/19/2022   Lab Results  Component Value Date   ALT 28 05/19/2022   AST 35 05/19/2022   GGT 19 05/18/2022   ALKPHOS 38 05/19/2022   BILITOT 0.7 05/19/2022   No results for input(s): "APTT", "INR", "PTT" in the last 168 hours.  CT abd/pelvis with contrast 05/18/2022: IMPRESSION: Partial large bowel obstruction with large volume stool within the ascending, transverse, and descending colon with a point of transition  identified within the proximal sigmoid colon. This may represent a post inflammatory stricture, however, an infiltrative mass as can be seen with a primary colonic malignancy is not excluded. Further evaluation with colonoscopy is recommended.   Small hiatal hernia   Aortic Atherosclerosis (ICD10-I70.0).  Assessment/Plan:  61 y/o female with a PMH of HTN, asthma, Hx of endometrial cancer, mixed connective tissue disease, and Hx of VTEs admitted to [facility] yesterday evening for 2-day history of abdominal pain and constipation found to have partial large bowel obstruction with area of narrowing in the sigmoid colon   Colonic stricture - CT with evidence of pLBO with large volume stool w/i ascending, transverse, and descending colon with transition point within proximal sigmoid colon. DDx includes post-inflammatory stricture from diverticular disease versus infiltrative colon mass  Partial large bowel obstruction - 2/2 #1  Hypokalemia - 2.7 this morning, repleted and now 3.7.   Constipation - 2/2 acute disease, will monitor   Recommendations:  - Continue supportive care with IV fluid hydration, pain control, and anti-emetics  - NPO status - Continue to replete electrolytes as needed per primary team - Continue serial abdominal examinations - Reviewed CT scan impression and diagnosis with patient and husband today in room. I reviewed that next step would be to undergo luminal evaluation so we can assess the colonic stricture and take biopsies of area to rule out post-inflammatory versus malignant stricture.  - Recommend flexible sigmoidoscopy this afternoon with Dr. Alice Reichert - Appreciate General Surgery assistance and recommendations. Surgery following.  - Further recommendations after procedure  I reviewed the risks (including bleeding, perforation, infection, anesthesia complications, cardiac/respiratory complications), benefits and alternatives of flexible sigmoidoscopy. Patient consents  to proceed.    Thank you for the consult. Please call with questions or concerns.  Geanie Kenning, PA-C [facility] [phone]

## 2022-05-19 NOTE — Anesthesia Preprocedure Evaluation (Signed)
Anesthesia Evaluation  Patient identified by MRN, date of birth, ID band Patient awake    Reviewed: Allergy & Precautions, H&P , NPO status , Patient's Chart, lab work & pertinent test results, reviewed documented beta blocker date and time   History of Anesthesia Complications Negative for: history of anesthetic complications  Airway Mallampati: III  TM Distance: >3 FB Neck ROM: full    Dental  (+) Caps, Dental Advidsory Given, Teeth Intact   Pulmonary neg shortness of breath, asthma , neg sleep apnea, neg COPD, neg recent URI,    Pulmonary exam normal breath sounds clear to auscultation       Cardiovascular Exercise Tolerance: Good hypertension, (-) angina(-) Past MI and (-) Cardiac Stents Normal cardiovascular exam(-) dysrhythmias (-) Valvular Problems/Murmurs Rhythm:regular Rate:Normal     Neuro/Psych negative neurological ROS  negative psych ROS   GI/Hepatic Neg liver ROS, GERD  ,  Endo/Other  negative endocrine ROS  Renal/GU negative Renal ROS  negative genitourinary   Musculoskeletal   Abdominal   Peds  Hematology negative hematology ROS (+)   Anesthesia Other Findings Past Medical History: No date: Asthma No date: Autoimmune disease ([place]) No date: Cancer ([place])     Comment:  Endometrial No date: H/O blood clots No date: Hypertension   Reproductive/Obstetrics negative OB ROS                             Anesthesia Physical Anesthesia Plan  ASA: 2  Anesthesia Plan: General   Post-op Pain Management:    Induction: Intravenous  PONV Risk Score and Plan: 3 and Propofol infusion and TIVA  Airway Management Planned: Natural Airway and Nasal Cannula  Additional Equipment:   Intra-op Plan:   Post-operative Plan:   Informed Consent: I have reviewed the patients History and Physical, chart, labs and discussed the procedure including the risks, benefits and alternatives for  the proposed anesthesia with the patient or authorized representative who has indicated his/her understanding and acceptance.     Dental Advisory Given  Plan Discussed with: Anesthesiologist, CRNA and Surgeon  Anesthesia Plan Comments:         Anesthesia Quick Evaluation

## 2022-05-19 NOTE — Progress Notes (Signed)
05/19/22  Discussed flex sig results with patient.  On the images, the sigmoid colon does not appear fully obstructed/stenosed, though there is some fecal impaction.  No gross mass found, but diverticular disease.  She feels better this afternoon.  Discussed with her plan for KUB tomorrow morning to assess her colonic distention.  If continues to improve, then we may be able to do a slow bowel prep over the weekend in anticipation for sigmoidectomy on Monday.  However, if worsening, would take her to OR tomorrow for likely a diverting colostomy vs hartmanns.  Continue lactulose and enemas today as she has tolerated these well.  Olean Ree, MD

## 2022-05-19 NOTE — Plan of Care (Signed)

## 2022-05-19 NOTE — TOC Initial Note (Signed)
Transition of Care [facility]) - Initial/Assessment Note    Patient Details  Name: Elizabeth Martin MRN: [id number] Date of Birth: 02-15-1961  Transition of Care [facility]) CM/SW Contact:    Pete Pelt, RN Phone Number: 05/19/2022, 1:45 PM  Clinical Narrative:    Patient lives at home with spouse, no reported concerns about transportation or medications at this time.  No current concerns for patient returning home with spouse at this time.  TOC will follow for any needs arising, family does not anticipate TOC needs at this time.               Expected Discharge Plan: Home/Self Care Barriers to Discharge: Continued Medical Work up   Patient Goals and CMS Choice     Choice offered to / list presented to : NA  Expected Discharge Plan and Services Expected Discharge Plan: Home/Self Care       Living arrangements for the past 2 months: Single Family Home                                      Prior Living Arrangements/Services Living arrangements for the past 2 months: Single Family Home Lives with:: Self, Spouse Patient language and need for interpreter reviewed:: Yes        Need for Family Participation in Patient Care: Yes (Comment) Care giver support system in place?: Yes (comment)   Criminal Activity/Legal Involvement Pertinent to Current Situation/Hospitalization: No - Comment as needed  Activities of Daily Living Home Assistive Devices/Equipment: None ADL Screening (condition at time of admission) Patient's cognitive ability adequate to safely complete daily activities?: Yes Is the patient deaf or have difficulty hearing?: No Does the patient have difficulty seeing, even when wearing glasses/contacts?: No Does the patient have difficulty concentrating, remembering, or making decisions?: No Patient able to express need for assistance with ADLs?: Yes Does the patient have difficulty dressing or bathing?: No Independently performs ADLs?: Yes (appropriate for  developmental age) Does the patient have difficulty walking or climbing stairs?: No Weakness of Legs: None Weakness of Arms/Hands: None  Permission Sought/Granted Permission sought to share information with : Case Manager Permission granted to share information with : Yes, Verbal Permission Granted              Emotional Assessment Appearance:: Appears stated age Attitude/Demeanor/Rapport: Gracious, Engaged Affect (typically observed): Pleasant, Appropriate Orientation: : Oriented to Self, Oriented to Place, Oriented to  Time, Oriented to Situation Alcohol / Substance Use: Not Applicable Psych Involvement: No (comment)  Admission diagnosis:  Hypokalemia [E87.6] Large bowel obstruction (HCC) [K56.609] Abdominal pain [R10.9] Patient Active Problem List   Diagnosis Date Noted   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

## 2022-05-19 NOTE — Interval H&P Note (Signed)
History and Physical Interval Note:  05/19/2022 2:34 PM  Elizabeth Martin  has presented today for surgery, with the diagnosis of Colonic stricture, partial large bowel obstruction.  The various methods of treatment have been discussed with the patient and family. After consideration of risks, benefits and other options for treatment, the patient has consented to  Procedure(s): FLEXIBLE SIGMOIDOSCOPY (N/A) as a surgical intervention.  The patient's history has been reviewed, patient examined, no change in status, stable for surgery.  I have reviewed the patient's chart and labs.  Questions were answered to the patient's satisfaction.     [place], [place]

## 2022-05-19 NOTE — Progress Notes (Signed)
Pt with critical lab of potassium 2.7. Elizabeth Martin/hospitalist made aware. Will continue to monitor the pt.

## 2022-05-19 NOTE — Progress Notes (Signed)
Called report to [facility] regarding procedure results. Waiting on xray for portable KUB for bowel obstruction.

## 2022-05-19 NOTE — Plan of Care (Signed)

## 2022-05-19 NOTE — Progress Notes (Signed)
  Progress Note   Patient: Elizabeth Martin:[id number] DOB: 1961-03-25 DOA: 05/18/2022     1 DOS: the patient was seen and examined on 05/19/2022   Brief hospital course: Elizabeth Martin is a 60 y.o. female with medical history significant of asthma, endometrial cancer, history of VTE's and hypertension coming in with abdominal pain and constipation.  Patient has a history of abdominal pain going on for few days getting worse along with nausea and vomiting.  CT scan of abdomen/pelvis with contrast showed large bowel obstruction with a transition point at the sigmoid colon.  Patient has been seen by general surgery, suspect diverticulosis with stricture.  Patient is placed on n.p.o., IVF, GI consult is obtained.   Assessment and Plan: Large bowel obstruction likely secondary to colon stricture. Nausea and vomiting secondary to bowel obstruction. Patient currently is treated with IV fluids and symptomatic treatment.  Patient will be seen by GI for possible colonoscopy.  Hypokalemia. Repleted, recheck level.  Also check magnesium level.  Essential hypertension. Continue scheduled hydralazine       Subjective:  Had a significant nausea vomiting this morning, feels better after giving Compazine. Still constipated. Denies any short of breath  Physical Exam: Vitals:   05/18/22 2337 05/19/22 0419 05/19/22 0651 05/19/22 0914  BP: (!) 150/80 135/75  135/72  Pulse: 73 91  88  Resp: '20 20  18  '$ Temp: 98.3 F (36.8 C) 98.3 F (36.8 C)  98.1 F (36.7 C)  TempSrc:    Oral  SpO2: 100% 96%  99%  Weight:   78.5 kg   Height:       General exam: Appears calm and comfortable  Respiratory system: Clear to auscultation. Respiratory effort normal. Cardiovascular system: S1 & S2 heard, RRR. No JVD, murmurs, rubs, gallops or clicks. No pedal edema. Gastrointestinal system: Abdomen is nondistended, soft and nontender. No organomegaly or masses felt. Normal bowel sounds heard. Central nervous  system: Alert and oriented. No focal neurological deficits. Extremities: Symmetric 5 x 5 power. Skin: No rashes, lesions or ulcers Psychiatry: Judgement and insight appear normal. Mood & affect appropriate.   Data Reviewed:  CT results and the lab results reviewed.  Family Communication: Husband at bedside.  Disposition: Status is: Inpatient Remains inpatient appropriate because: Severity of disease and pending inpatient procedure.  Planned Discharge Destination: Home    Time spent: 35 minutes  Author: Sharen Hones, MD 05/19/2022 10:58 AM  For on call review [email or web address].

## 2022-05-20 ENCOUNTER — Encounter: Payer: Self-pay | Admitting: Internal Medicine

## 2022-05-20 ENCOUNTER — Inpatient Hospital Stay: Payer: BC Managed Care – PPO

## 2022-05-20 ENCOUNTER — Encounter: Payer: BC Managed Care – PPO | Admitting: Occupational Therapy

## 2022-05-20 DIAGNOSIS — K56699 Other intestinal obstruction unspecified as to partial versus complete obstruction: Secondary | ICD-10-CM | POA: Diagnosis not present

## 2022-05-20 DIAGNOSIS — I10 Essential (primary) hypertension: Secondary | ICD-10-CM | POA: Diagnosis not present

## 2022-05-20 DIAGNOSIS — K56609 Unspecified intestinal obstruction, unspecified as to partial versus complete obstruction: Secondary | ICD-10-CM | POA: Diagnosis not present

## 2022-05-20 DIAGNOSIS — R1084 Generalized abdominal pain: Secondary | ICD-10-CM | POA: Diagnosis not present

## 2022-05-20 LAB — BASIC METABOLIC PANEL
Anion gap: 5 (ref 5–15)
BUN: 6 mg/dL — ABNORMAL LOW (ref 8–23)
CO2: 25 mmol/L (ref 22–32)
Calcium: 8.4 mg/dL — ABNORMAL LOW (ref 8.9–10.3)
Chloride: 109 mmol/L (ref 98–111)
Creatinine, Ser: 0.56 mg/dL (ref 0.44–1.00)
GFR, Estimated: >60 mL/min (ref >60)
Glucose, Bld: 84 mg/dL (ref 70–99)
Potassium: 3.4 mmol/L — ABNORMAL LOW (ref 3.5–5.1)
Sodium: 139 mmol/L (ref 135–145)

## 2022-05-20 LAB — ABO/RH: ABO/RH(D): O NEG

## 2022-05-20 LAB — SURGICAL PCR SCREEN
MRSA, PCR: NEGATIVE
Staphylococcus aureus: NEGATIVE

## 2022-05-20 LAB — TYPE AND SCREEN
ABO/RH(D): O NEG
Antibody Screen: NEGATIVE

## 2022-05-20 LAB — MAGNESIUM: Magnesium: 2.6 mg/dL — ABNORMAL HIGH (ref 1.7–2.4)

## 2022-05-20 MED ORDER — POTASSIUM CHLORIDE 10 MEQ/100ML IV SOLN
10.0000 meq | INTRAVENOUS | Status: AC
  Administered 2022-05-20 (×3): 10 meq via INTRAVENOUS
  Filled 2022-05-20 (×3): qty 100

## 2022-05-20 MED ORDER — AMLODIPINE BESYLATE 5 MG PO TABS
5.0000 mg | ORAL_TABLET | Freq: Every day | ORAL | Status: DC
  Administered 2022-05-20 – 2022-05-27 (×8): 5 mg via ORAL
  Filled 2022-05-20 (×8): qty 1

## 2022-05-20 MED ORDER — MUPIROCIN 2 % EX OINT
TOPICAL_OINTMENT | Freq: Two times a day (BID) | CUTANEOUS | Status: DC
  Filled 2022-05-20: qty 22

## 2022-05-20 MED ORDER — POLYETHYLENE GLYCOL 3350 17 G PO PACK
17.0000 g | PACK | Freq: Every day | ORAL | Status: DC
  Administered 2022-05-21: 17 g via ORAL
  Filled 2022-05-20: qty 1

## 2022-05-20 MED ORDER — ORAL CARE MOUTH RINSE: 15.0000 mL | OROMUCOSAL | Status: DC | PRN

## 2022-05-20 NOTE — Anesthesia Preprocedure Evaluation (Signed)
Anesthesia Evaluation  Patient identified by MRN, date of birth, ID band Patient awake    Reviewed: Allergy & Precautions, H&P , NPO status , Patient's Chart, lab work & pertinent test results, reviewed documented beta blocker date and time   History of Anesthesia Complications Negative for: history of anesthetic complications  Airway Mallampati: III  TM Distance: >3 FB Neck ROM: full    Dental  (+) Caps, Dental Advidsory Given, Teeth Intact   Pulmonary neg shortness of breath, asthma , neg sleep apnea, neg COPD, neg recent URI,    Pulmonary exam normal breath sounds clear to auscultation       Cardiovascular Exercise Tolerance: Good hypertension, (-) angina(-) Past MI and (-) Cardiac Stents Normal cardiovascular exam(-) dysrhythmias (-) Valvular Problems/Murmurs Rhythm:regular Rate:Normal     Neuro/Psych negative neurological ROS  negative psych ROS   GI/Hepatic Neg liver ROS, GERD  ,Partial large bowel obstruction   Endo/Other  negative endocrine ROS  Renal/GU negative Renal ROS  negative genitourinary   Musculoskeletal   Abdominal   Peds  Hematology negative hematology ROS (+) H/o blood clots   Anesthesia Other Findings Hypokalemia  Past Medical History: No date: Asthma No date: Autoimmune disease (HCC) No date: Cancer ([place])     Comment:  Endometrial No date: H/O blood clots No date: Hypertension   Reproductive/Obstetrics negative OB ROS                             Anesthesia Physical  Anesthesia Plan  ASA: 2  Anesthesia Plan: General   Post-op Pain Management: Toradol IV (intra-op)* and Ofirmev IV (intra-op)*   Induction: Intravenous  PONV Risk Score and Plan: 3 and Ondansetron, Dexamethasone and Midazolam  Airway Management Planned: Oral ETT  Additional Equipment:   Intra-op Plan:   Post-operative Plan:   Informed Consent:     Dental Advisory Given  Plan  Discussed with: Anesthesiologist, CRNA and Surgeon  Anesthesia Plan Comments:         Anesthesia Quick Evaluation

## 2022-05-20 NOTE — Progress Notes (Signed)
  Progress Note   Patient: Elizabeth Martin UXN:[id number] DOB: 04/10/1961 DOA: 05/18/2022     2 DOS: the patient was seen and examined on 05/20/2022   Brief hospital course: Francia Verry is a 61 y.o. female with medical history significant of asthma, endometrial cancer, history of VTE's and hypertension coming in with abdominal pain and constipation.  Patient has a history of abdominal pain going on for few days getting worse along with nausea and vomiting.  CT scan of abdomen/pelvis with contrast showed large bowel obstruction with a transition point at the sigmoid colon.  Patient has been seen by general surgery, suspect diverticulosis with stricture.  Patient is placed on n.p.o., IVF, GI consult is obtained.   Assessment and Plan: Large bowel obstruction likely secondary to colon stricture. Nausea and vomiting secondary to bowel obstruction. Sigmoidoscopy was unsuccessful due to large amount of stool, partial results showed diverticulosis. Patient was given multiple doses of lactulose, had 3 large bowel movements today.  Liquid started started, fluids discontinued. Discussed with general surgery, would like to keep patient for Monday procedure.   Hypokalemia. Potassium is 3.3 today, will give additional 30 mEq of KCl IV.  Essential hypertension. Added amlodipine.      Subjective:  Patient feels much better today after 3 large bowel movements.  No nausea vomiting abdominal pain  Physical Exam: Vitals:   05/19/22 2003 05/20/22 0513 05/20/22 0849 05/20/22 1147  BP: (!) 110/57 129/65 (!) 148/94 (!) 158/82  Pulse: 97 87 100 90  Resp: '18 20 16 16  '$ Temp: 98.4 F (36.9 C) 98.2 F (36.8 C) 98.7 F (37.1 C) 98.8 F (37.1 C)  TempSrc:      SpO2: 98% 98% 99% 100%  Weight:      Height:       General exam: Appears calm and comfortable  Respiratory system: Clear to auscultation. Respiratory effort normal. Cardiovascular system: S1 & S2 heard, RRR. No JVD, murmurs, rubs, gallops or  clicks. No pedal edema. Gastrointestinal system: Abdomen is nondistended, soft and nontender. No organomegaly or masses felt. Normal bowel sounds heard. Central nervous system: Alert and oriented. No focal neurological deficits. Extremities: Symmetric 5 x 5 power. Skin: No rashes, lesions or ulcers Psychiatry: Judgement and insight appear normal. Mood & affect appropriate.   Data Reviewed:  Lab results reviewed  Family Communication: Husband updated.  Disposition: Status is: Inpatient Remains inpatient appropriate because: Severity of disease and pending procedure  Planned Discharge Destination: Home    Time spent: 35 minutes  Author: Sharen Hones, MD 05/20/2022 2:17 PM  For on call review [email or web address].

## 2022-05-20 NOTE — Anesthesia Postprocedure Evaluation (Signed)
Anesthesia Post Note  Patient: Elizabeth Martin  Procedure(s) Performed: [place]  Patient location during evaluation: Endoscopy Anesthesia Type: General Level of consciousness: awake and alert Pain management: pain level controlled Vital Signs Assessment: post-procedure vital signs reviewed and stable Respiratory status: spontaneous breathing, nonlabored ventilation, respiratory function stable and patient connected to nasal cannula oxygen Cardiovascular status: blood pressure returned to baseline and stable Postop Assessment: no apparent nausea or vomiting Anesthetic complications: no   No notable events documented.   Last Vitals:  Vitals:   05/20/22 0849 05/20/22 1147  BP: (!) 148/94 (!) 158/82  Pulse: 100 90  Resp: 16 16  Temp: 37.1 C 37.1 C  SpO2: 99% 100%    Last Pain:  Vitals:   05/20/22 1000  TempSrc:   PainSc: 0-No pain                 Martha Clan

## 2022-05-20 NOTE — Progress Notes (Signed)
[facility] SURGICAL PROGRESS NOTE (cpt [phone])  Hospital Day(s): 2.   Post op day(s): 1 Day Post-Op.   Interval History: Patient seen and examined, no acute events or new complaints overnight. Patient reports she is feeling much better this morning. She is up walking around the room. Abdominal pain essentially resolved. No further nausea, emesis, or distension. She has been getting aggressive bowel regimen with lactulose, senokot, and mineral oil enemas. She has had flatus and small BMs. Flexible sigmoidoscopy (06/14 - Dr Ferol Luz) was again reviewed showing the sigmoid colon does not appear fully obstructed/stenosed, though there is some fecal impaction. No gross mass found, but diverticular disease. Her labs this morning are reassuring. KUB consistent with constipation, distension improved. She remains NPO  Review of Systems:  Constitutional: denies fever, chills  HEENT: denies cough or congestion  Respiratory: denies any shortness of breath  Cardiovascular: denies chest pain or palpitations  Gastrointestinal: denies abdominal pain, N/V/D Genitourinary: denies burning with urination or urinary frequency Musculoskeletal: denies pain, decreased motor or sensation  Vital signs in last 24 hours: [min-max] current  Temp:  [96.6 F (35.9 C)-98.8 F (37.1 C)] 98.8 F (37.1 C) (06/15 1147) Pulse Rate:  [84-100] 90 (06/15 1147) Resp:  [9-21] 16 (06/15 1147) BP: (89-158)/(52-94) 158/82 (06/15 1147) SpO2:  [94 %-100 %] 100 % (06/15 1147)     Height: '5\' 7"'$  (170.2 cm) Weight: 78.5 kg BMI (Calculated): 27.1   Intake/Output last 2 shifts:  06/14 0701 - 06/15 0700 In: 750 [I.V.:750] Out: -    Physical Exam:  Constitutional: alert, cooperative and no distress  HENT: normocephalic without obvious abnormality  Eyes: PERRL, EOM's grossly intact and symmetric  Respiratory: breathing non-labored at rest  Cardiovascular: regular rate and sinus rhythm  Gastrointestinal: soft,  non-tender, and non-distended, no rebound/guarding  Musculoskeletal: no edema or wounds, motor and sensation grossly intact, NT    Labs:     Latest Ref Rng & Units 05/19/2022    3:24 AM 05/18/2022    7:13 PM 03/31/2022    7:28 PM  CBC  WBC 4.0 - 10.5 K/uL 9.6  8.0  9.4   Hemoglobin 12.0 - 15.0 g/dL 12.5  14.1  13.7   Hematocrit 36.0 - 46.0 % 37.3  41.6  41.3   Platelets 150 - 400 K/uL 309  336  393       Latest Ref Rng & Units 05/20/2022    3:14 AM 05/19/2022   12:09 PM 05/19/2022    3:24 AM  CMP  Glucose 70 - 99 mg/dL 84   121   BUN 8 - 23 mg/dL 6   8   Creatinine 0.44 - 1.00 mg/dL 0.56   0.32   Sodium 135 - 145 mmol/L 139   136   Potassium 3.5 - 5.1 mmol/L 3.4  3.7  2.7   Chloride 98 - 111 mmol/L 109   101   CO2 22 - 32 mmol/L 25   30   Calcium 8.9 - 10.3 mg/dL 8.4   8.8   Total Protein 6.5 - 8.1 g/dL   6.5   Total Bilirubin 0.3 - 1.2 mg/dL   0.7   Alkaline Phos 38 - 126 U/L   38   AST 15 - 41 U/L   35   ALT 0 - 44 U/L   28    Imaging studies:   KUB (05/20/2022) personally reviewed which shows constipation (known), gaseous distension improved compared to prior, no free air, and radiologist report  reviewed below:  IMPRESSION: 1. No acute findings. 2. Possible constipation.   Assessment/Plan: (ICD-10's: K33.609) 61 y.o. female with clinically and radiographically improving large bowel obstruction secondary to stricture of the sigmoid colon   - Greatly appreciate GI assistance with sigmoidoscopy; images and note reviewed - Patient and radiographic findings are improved this morning and she is having some bowel function. No concern for complete obstruction. As such, we will hold off on any surgical intervention today. The goal is to prep her bowel over the weekend with the goal of proceeding with sigmoid colectomy on Monday (06/19) with Dr Hampton Abbot.   - Okay for CLD today    - Continue aggressive bowel regimen for above and below - Monitor abdominal examination; on-going  bowel function - Pain control prn; antiemetic prn - Mobilization as tolerated             - Further management per primary service; we will follow    All of the above findings and recommendations were discussed with the patient, and the medical team, and all of patient's questions were answered to her expressed satisfaction.  -- Edison Simon, PA-C [facility] 05/20/2022, 1:00 PM M-F: 7am - 4pm

## 2022-05-21 ENCOUNTER — Other Ambulatory Visit: Payer: Self-pay | Admitting: Urology

## 2022-05-21 DIAGNOSIS — K56699 Other intestinal obstruction unspecified as to partial versus complete obstruction: Secondary | ICD-10-CM | POA: Diagnosis not present

## 2022-05-21 DIAGNOSIS — R1084 Generalized abdominal pain: Secondary | ICD-10-CM | POA: Diagnosis not present

## 2022-05-21 DIAGNOSIS — K56609 Unspecified intestinal obstruction, unspecified as to partial versus complete obstruction: Secondary | ICD-10-CM | POA: Diagnosis not present

## 2022-05-21 LAB — BASIC METABOLIC PANEL
Anion gap: 6 (ref 5–15)
BUN: <5 mg/dL — ABNORMAL LOW (ref 8–23)
CO2: 25 mmol/L (ref 22–32)
Calcium: 9 mg/dL (ref 8.9–10.3)
Chloride: 107 mmol/L (ref 98–111)
Creatinine, Ser: 0.37 mg/dL — ABNORMAL LOW (ref 0.44–1.00)
GFR, Estimated: >60 mL/min (ref >60)
Glucose, Bld: 112 mg/dL — ABNORMAL HIGH (ref 70–99)
Potassium: 3.4 mmol/L — ABNORMAL LOW (ref 3.5–5.1)
Sodium: 138 mmol/L (ref 135–145)

## 2022-05-21 LAB — MAGNESIUM: Magnesium: 2.3 mg/dL (ref 1.7–2.4)

## 2022-05-21 MED ORDER — POTASSIUM CHLORIDE 10 MEQ/100ML IV SOLN
10.0000 meq | INTRAVENOUS | Status: AC
  Administered 2022-05-21 (×4): 10 meq via INTRAVENOUS
  Filled 2022-05-21 (×4): qty 100

## 2022-05-21 MED ORDER — BOOST / RESOURCE BREEZE PO LIQD CUSTOM
1.0000 | Freq: Three times a day (TID) | ORAL | Status: DC
  Administered 2022-05-21 – 2022-05-26 (×10): 1 via ORAL

## 2022-05-21 MED ORDER — PROSOURCE PLUS PO LIQD
30.0000 mL | Freq: Two times a day (BID) | ORAL | Status: DC
  Administered 2022-05-22 – 2022-05-25 (×5): 30 mL via ORAL
  Filled 2022-05-21 (×9): qty 30

## 2022-05-21 MED ORDER — ADULT MULTIVITAMIN W/MINERALS CH
1.0000 | ORAL_TABLET | Freq: Every day | ORAL | Status: DC
  Administered 2022-05-21 – 2022-05-27 (×7): 1 via ORAL
  Filled 2022-05-21 (×7): qty 1

## 2022-05-21 MED ORDER — PEG 3350-KCL-NA BICARB-NACL 420 G PO SOLR
4000.0000 mL | Freq: Once | ORAL | Status: AC
  Administered 2022-05-21: 4000 mL via ORAL
  Filled 2022-05-21: qty 4000

## 2022-05-21 NOTE — Progress Notes (Signed)
Report called to Cornelia Copa, RN and patient transported to 203

## 2022-05-21 NOTE — Progress Notes (Signed)
Initial Nutrition Assessment  DOCUMENTATION CODES:   Not applicable  INTERVENTION:   -Boost Breeze po TID, each supplement provides 250 kcal and 9 grams of protein  -30 ml Prosource Plus BID, each supplement provides 100 kcals and 15 grams protein -MVI with minerals daily -RD will monitor for diet advancement and adjust supplements regimen as appropriate  NUTRITION DIAGNOSIS:   Increased nutrient needs related to post-op healing as evidenced by estimated needs.  GOAL:   Patient will meet greater than or equal to 90% of their needs  MONITOR:   PO intake, Supplement acceptance, Diet advancement  REASON FOR ASSESSMENT:   Consult Assessment of nutrition requirement/status  ASSESSMENT:   Pt with medical history significant of asthma, endometrial cancer, history of VTE's and hypertension coming in with abdominal pain and constipation.  Pt admitted with large bowel obstruction likely secondary to colonic stricture.   6/14- s/p flex sig 6/15- advanced to clear liquid diet  Reviewed I/O's: +360 ml x 24 hours and +2.2 L since admission  Per general surgery notes and discussion with RN, plan for sigmoid colectomy on Monday, 05/24/22.  Spoke with pt, who was pleasant and in good spirits today. She reports that she is tolerating clear liquids well, but only likes the savory foods such as broth. PTA, pt reports good appetite up until 2 days PTA, which is when the nausea and vomiting started occurring. At baseline, pt consumes 3 meals per day (Breakfast: oatmeal with fruit, honey, and almond milk; Lunch: salad with vegetables; Dinner: meat, starch, and vegetables). Pt reports that she tries to eat as plant based and organic as possible and also supplements her diet with Orgain protein powder.   Pt reports she thinks she has lost about 20 pounds over the pat year, however, suspect wt loss may be masked by fluid. Reviewed wt hx; wt has been stable over the past 3 months.   Pt expresses  concern over limited clear liquid diet and is interested in trying protein supplements. Reviewed formulary with pt. Discussed importance of good meal and supplement intake to promote healing.   Medications reviewed and include lactulose, miralax, senokot, and potassium chloride.   Labs reviewed: K: 3.4.    NUTRITION - FOCUSED PHYSICAL EXAM:  Flowsheet Row Most Recent Value  Orbital Region No depletion  Upper Arm Region No depletion  Thoracic and Lumbar Region No depletion  Buccal Region No depletion  Temple Region No depletion  Clavicle Bone Region No depletion  Clavicle and Acromion Bone Region No depletion  Scapular Bone Region No depletion  Dorsal Hand No depletion  Patellar Region No depletion  Anterior Thigh Region No depletion  Posterior Calf Region No depletion  Edema (RD Assessment) Mild  Hair Reviewed  Eyes Reviewed  Mouth Reviewed  Skin Reviewed  Nails Reviewed       Diet Order:   Diet Order             Diet clear liquid Room service appropriate? Yes; Fluid consistency: Thin  Diet effective now                   EDUCATION NEEDS:   Education needs have been addressed  Skin:  Skin Assessment: Reviewed RN Assessment  Last BM:  05/20/22  Height:   Ht Readings from Last 1 Encounters:  05/18/22 '5\' 7"'$  (1.702 m)    Weight:   Wt Readings from Last 1 Encounters:  05/19/22 78.5 kg    Ideal Body Weight:  61.4 kg  BMI:  Body mass index is 27.11 kg/m.  Estimated Nutritional Needs:   Kcal:  1850-2050  Protein:  90-105 grams  Fluid:  > 1.8 L    Loistine Chance, RD, LDN, [place] Registered Dietitian II Certified Diabetes Care and Education Specialist Please refer to [facility] for RD and/or RD on-call/weekend/after hours pager

## 2022-05-21 NOTE — Progress Notes (Signed)
  Progress Note   Patient: Elizabeth Martin FUX:[id number] DOB: May 30, 1961 DOA: 05/18/2022     3 DOS: the patient was seen and examined on 05/21/2022   Brief hospital course: Erina Hamme is a 61 y.o. female with medical history significant of asthma, endometrial cancer, history of VTE's and hypertension coming in with abdominal pain and constipation.  Patient has a history of abdominal pain going on for few days getting worse along with nausea and vomiting.  CT scan of abdomen/pelvis with contrast showed large bowel obstruction with a transition point at the sigmoid colon.  Patient has been seen by general surgery, suspect diverticulosis with stricture.  Patient had a partial sigmoidoscopy 6/14.  Showed sigmoid diverticulosis without diverticulitis.  Could not advance further.  However, patient was given multiple stool softener and a laxative, she started to have large amount of bowel movements.  Symptom has resolved since 6/15. Surgery is scheduling for sigmoidoscopy and a partial diversion ostomy.  Assessment and Plan: Large bowel obstruction likely secondary to colon stricture. Nausea and vomiting secondary to bowel obstruction. Condition had improved after multiple bowel movements.  Diet advanced to full liquid, IV fluids discontinued per surgery. General surgery want to keep patient over the weekend to consider for diverging ostomy.   Hypokalemia. Given more IV potassium.  Check a BMP tomorrow  Essential hypertension. Amlodipine.      Subjective:  Patient condition improved.  Currently no symptoms.  Physical Exam: Vitals:   05/20/22 1937 05/20/22 2335 05/21/22 0323 05/21/22 0734  BP: 138/82 (!) 156/84 (!) 152/77 126/89  Pulse: 94 92 84 100  Resp: '15 15 15 17  '$ Temp: 97.8 F (36.6 C) 98.1 F (36.7 C) 98.2 F (36.8 C)   TempSrc:      SpO2: 99% 100% 100% 99%  Weight:      Height:       General exam: Appears calm and comfortable  Respiratory system: Clear to auscultation.  Respiratory effort normal. Cardiovascular system: S1 & S2 heard, RRR. No JVD, murmurs, rubs, gallops or clicks. No pedal edema. Gastrointestinal system: Abdomen is nondistended, soft and nontender. No organomegaly or masses felt. Normal bowel sounds heard. Central nervous system: Alert and oriented. No focal neurological deficits. Extremities: Symmetric 5 x 5 power. Skin: No rashes, lesions or ulcers Psychiatry: Judgement and insight appear normal. Mood & affect appropriate.   Data Reviewed:  Reviewed lab results.  Family Communication: Husband updated at bedside  Disposition: Status is: Inpatient Remains inpatient appropriate because: Severity of disease and pending procedure.  Planned Discharge Destination: Home    Time spent: 25 minutes  Author: Sharen Hones, MD 05/21/2022 2:52 PM  For on call review [email or web address].

## 2022-05-21 NOTE — Progress Notes (Signed)
Worsening BLE edema noted, R>L. Patient did receive IVF to dilute IV potassium. Notified MD, encouraged her to elevate her legs. SCD's ordered for the patient.

## 2022-05-21 NOTE — Progress Notes (Signed)
[facility] SURG[facility] PROGRESS NOTE (cpt [id number])  Hospital Day(s): 3.   Post op day(s): 2 Days Post-Op.   Interval History: Patient seen and examined, no acute events or new complaints overnight. Patient reports she is feeling better. No abdominal pain, nausea, emesis. Hypokalemia remains stable at 3.4. renal function normal; sCr - 0.37; UO - unmeasured. She is on CLD; tolerating well. Continues on bowel regimen, having multiple bowel movements.    Review of Systems:  Constitutional: denies fever, chills  HEENT: denies cough or congestion  Respiratory: denies any shortness of breath  Cardiovascular: denies chest pain or palpitations  Gastrointestinal: denies abdominal pain, N/V/D Genitourinary: denies burning with urination or urinary frequency Musculoskeletal: denies pain, decreased motor or sensation  Vital signs in last 24 hours: [min-max] current  Temp:  [97.8 F (36.6 C)-99 F (37.2 C)] 98.2 F (36.8 C) (06/16 0323) Pulse Rate:  [84-100] 84 (06/16 0323) Resp:  [15-16] 15 (06/16 0323) BP: (138-158)/(77-94) 152/77 (06/16 0323) SpO2:  [99 %-100 %] 100 % (06/16 0323)     Height: '5\' 7"'$  (170.2 cm) Weight: 78.5 kg BMI (Calculated): 27.1   Intake/Output last 2 shifts:  06/15 0701 - 06/16 0700 In: 360 [P.O.:360] Out: -    Physical Exam:  Constitutional: alert, cooperative and no distress  HENT: normocephalic without obvious abnormality  Eyes: PERRL, EOM's grossly intact and symmetric  Respiratory: breathing non-labored at rest  Cardiovascular: regular rate and sinus rhythm  Gastrointestinal: soft, non-tender, and non-distended, no rebound/guarding  Musculoskeletal: no edema or wounds, motor and sensation grossly intact, NT    Labs:     Latest Ref Rng & Units 05/19/2022    3:24 AM 05/18/2022    7:13 PM 03/31/2022    7:28 PM  CBC  WBC 4.0 - 10.5 K/uL 9.6  8.0  9.4   Hemoglobin 12.0 - 15.0 g/dL 12.5  14.1  13.7   Hematocrit 36.0 - 46.0 % 37.3  41.6  41.3    Platelets 150 - 400 K/uL 309  336  393       Latest Ref Rng & Units 05/21/2022    4:37 AM 05/20/2022    3:14 AM 05/19/2022   12:09 PM  CMP  Glucose 70 - 99 mg/dL 112  84    BUN 8 - 23 mg/dL <5  6    Creatinine 0.44 - 1.00 mg/dL 0.37  0.56    Sodium 135 - 145 mmol/L 138  139    Potassium 3.5 - 5.1 mmol/L 3.4  3.4  3.7   Chloride 98 - 111 mmol/L 107  109    CO2 22 - 32 mmol/L 25  25    Calcium 8.9 - 10.3 mg/dL 9.0  8.4     Imaging studies: No new pertinent imaging studies    Assessment/Plan: (ICD-10's: K66.609) 61 y.o. female with clinically and radiographically improving large bowel obstruction secondary to stricture of the sigmoid colon   - Greatly appreciate GI assistance with sigmoidoscopy; images and note reviewed - Again, the goal is to prep her bowel over the weekend with the goal of proceeding with sigmoid colectomy on Monday (06/19) with Dr Hampton Abbot.   - Okay for CLD today  - Continue aggressive bowel regimen for above and below throughout the weekend - Monitor abdominal examination; on-going bowel function - Pain control prn; antiemetic prn - Mobilization as tolerated             - Further management per primary service; we will follow  All of the above findings and recommendations were discussed with the patient, and the medical team, and all of patient's questions were answered to her expressed satisfaction.  -- Edison Simon, PA-C [facility] 05/21/2022, 7:22 AM M-F: 7am - 4pm

## 2022-05-22 ENCOUNTER — Encounter: Payer: Self-pay | Admitting: Internal Medicine

## 2022-05-22 DIAGNOSIS — I10 Essential (primary) hypertension: Secondary | ICD-10-CM | POA: Diagnosis not present

## 2022-05-22 DIAGNOSIS — R1030 Lower abdominal pain, unspecified: Secondary | ICD-10-CM | POA: Diagnosis not present

## 2022-05-22 DIAGNOSIS — K56609 Unspecified intestinal obstruction, unspecified as to partial versus complete obstruction: Secondary | ICD-10-CM | POA: Diagnosis not present

## 2022-05-22 DIAGNOSIS — K56699 Other intestinal obstruction unspecified as to partial versus complete obstruction: Secondary | ICD-10-CM | POA: Diagnosis not present

## 2022-05-22 LAB — MAGNESIUM: Magnesium: 2.3 mg/dL (ref 1.7–2.4)

## 2022-05-22 LAB — BASIC METABOLIC PANEL
Anion gap: 5 (ref 5–15)
BUN: <5 mg/dL — ABNORMAL LOW (ref 8–23)
CO2: 25 mmol/L (ref 22–32)
Calcium: 9 mg/dL (ref 8.9–10.3)
Chloride: 110 mmol/L (ref 98–111)
Creatinine, Ser: 0.38 mg/dL — ABNORMAL LOW (ref 0.44–1.00)
GFR, Estimated: >60 mL/min (ref >60)
Glucose, Bld: 102 mg/dL — ABNORMAL HIGH (ref 70–99)
Potassium: 4.1 mmol/L (ref 3.5–5.1)
Sodium: 140 mmol/L (ref 135–145)

## 2022-05-22 NOTE — Progress Notes (Signed)
  Progress Note   Patient: Elizabeth Martin [id number]:[id number] DOB: 10-Dec-1960 DOA: 05/18/2022     4 DOS: the patient was seen and examined on 05/22/2022   Brief hospital course: Annisa Mazzarella is a 61 y.o. female with medical history significant of asthma, endometrial cancer, history of VTE's and hypertension coming in with abdominal pain and constipation.  Patient has a history of abdominal pain going on for few days getting worse along with nausea and vomiting.  CT scan of abdomen/pelvis with contrast showed large bowel obstruction with a transition point at the sigmoid colon.  Patient has been seen by general surgery, suspect diverticulosis with stricture.  Patient had a partial sigmoidoscopy 6/14.  Showed sigmoid diverticulosis without diverticulitis.  Could not advance further.  However, patient was given multiple stool softener and a laxative, she started to have large amount of bowel movements.  Symptom has resolved since 6/15. Surgery is scheduling for sigmoidoscopy and a partial diversion ostomy.  Assessment and Plan:  Large bowel obstruction likely secondary to colon stricture. Nausea and vomiting secondary to bowel obstruction. Patient doing much better, symptom has resolved.  Patient is receiving a slow bowel prep.  Pending for sigmoidoscopy with possible surgery.   Hypokalemia. Improved.  Essential hypertension. Amlodipine.       Subjective:  Patient doing well today, had multiple bowel movement after bowel prep.  No nausea vomiting: Tolerating diet  Physical Exam: Vitals:   05/22/22 0009 05/22/22 0407 05/22/22 0436 05/22/22 0732  BP:   134/78 123/76  Pulse:   79 79  Resp:   20 18  Temp:   97.9 F (36.6 C) 97.9 F (36.6 C)  TempSrc:   Oral Oral  SpO2:   99% 98%  Weight: 82.9 kg 82.9 kg    Height:       General exam: Appears calm and comfortable  Respiratory system: Clear to auscultation. Respiratory effort normal. Cardiovascular system: S1 & S2 heard, RRR. No JVD,  murmurs, rubs, gallops or clicks. No pedal edema. Gastrointestinal system: Abdomen is nondistended, soft and nontender. No organomegaly or masses felt. Normal bowel sounds heard. Central nervous system: Alert and oriented. No focal neurological deficits. Extremities: Symmetric 5 x 5 power. Skin: No rashes, lesions or ulcers Psychiatry: Judgement and insight appear normal. Mood & affect appropriate.   Data Reviewed:  Reviewed lab results  Family Communication: Husband at bedside.  Disposition: Status is: Inpatient Remains inpatient appropriate because: Severity of disease, pending procedure  Planned Discharge Destination: Home    Time spent: 34 minutes  Author: Sharen Hones, MD 05/22/2022 2:22 PM  For on call review [email or web address].

## 2022-05-22 NOTE — Progress Notes (Signed)
GI courtesy note  I had a brief discussion yesterday with Dr. Hampton Abbot regarding consideration in choice of bowel prep for patient with partial colonic obstruction. It appears the prep Dr. Hampton Abbot ordered is working well and patient is having bowel movements.   Vital signs stable.  Agree to proceed with surgical treatment as outlined by Dr. Hampton Abbot.  GI signing off for now. Call me if I can help in the future.  Thank you,   T. Madolyn Frieze, M.D. ABIM Diplomate in Gastroenterology [place]

## 2022-05-22 NOTE — Progress Notes (Signed)
Mobility Specialist - Progress Note   05/22/22 1100  Mobility  Activity Ambulated independently in hallway  Level of Assistance Independent  Assistive Device None  Distance Ambulated (ft) 500 ft  Activity Response Tolerated well  $Mobility charge 1 Mobility     Merrily Brittle Mobility Specialist 05/22/22, 11:53 AM

## 2022-05-22 NOTE — Progress Notes (Signed)
05/22/2022  Subjective: No acute events overnight.  Patient started GoLytely bowel prep last night and reports that she has been tolerating well has been having bowel movements.  Denies any worsening abdominal pain.  She did have 1 short episode of lower abdominal discomfort yesterday but after walking and passing gas it improved.  Hypokalemia is now resolved.  Vital signs: Temp:  [97.9 F (36.6 C)-98.2 F (36.8 C)] 97.9 F (36.6 C) (06/17 0732) Pulse Rate:  [79-101] 79 (06/17 0732) Resp:  [18-20] 18 (06/17 0732) BP: (123-147)/(74-95) 123/76 (06/17 0732) SpO2:  [98 %-100 %] 98 % (06/17 0732) Weight:  [82.9 kg] 82.9 kg (06/17 0407)   Intake/Output: 06/16 0701 - 06/17 0700 In: 2536.6 [P.O.:1160; IV Piggyback:376.6] Out: -  Last BM Date : 05/22/22  Physical Exam: Constitutional: No acute distress Abdomen: Soft, nondistended, nontender to palpation.  Labs:  No results for input(s): "WBC", "HGB", "HCT", "PLT" in the last 72 hours. Recent Labs    05/21/22 0437 05/22/22 0646  NA 138 140  K 3.4* 4.1  CL 107 110  CO2 25 25  GLUCOSE 112* 102*  BUN <5* <5*  CREATININE 0.37* 0.38*  CALCIUM 9.0 9.0   No results for input(s): "LABPROT", "INR" in the last 72 hours.  Imaging: No results found.  Assessment/Plan: This is a 61 y.o. female with a partial large bowel obstruction related to a sigmoid stricture.  - Discussed with patient that bowel prep she can spread over today and tomorrow to continue clearing her stool.  61 year old goal would be to be able to do sigmoidectomy with primary anastomosis and diverting loop ileostomy on Monday.  Discussed with her that again depending on how everything looks we may need to use less.  Discussed with her that we will also ask our urology colleagues to help [place] with a cystoscopy and injection of ICG into the ureters in order to visualize the ureter better during surgery. - For now continue clear liquid diet and nutritional supplements and  managing electrolytes. - Consent, antibiotics, and other preoperative orders will be done tomorrow.   I spent 35 minutes dedicated to the care of this patient on the date of this encounter to include pre-visit review of records, face-to-face time with the patient discussing diagnosis and management, and any post-visit coordination of care.  Melvyn Neth, [facility]

## 2022-05-23 ENCOUNTER — Inpatient Hospital Stay: Payer: BC Managed Care – PPO

## 2022-05-23 ENCOUNTER — Encounter: Payer: Self-pay | Admitting: Internal Medicine

## 2022-05-23 DIAGNOSIS — K56699 Other intestinal obstruction unspecified as to partial versus complete obstruction: Secondary | ICD-10-CM | POA: Diagnosis not present

## 2022-05-23 DIAGNOSIS — K56609 Unspecified intestinal obstruction, unspecified as to partial versus complete obstruction: Secondary | ICD-10-CM | POA: Diagnosis not present

## 2022-05-23 DIAGNOSIS — R1030 Lower abdominal pain, unspecified: Secondary | ICD-10-CM | POA: Diagnosis not present

## 2022-05-23 DIAGNOSIS — I10 Essential (primary) hypertension: Secondary | ICD-10-CM | POA: Diagnosis not present

## 2022-05-23 LAB — PROTIME-INR
INR: 1 (ref 0.8–1.2)
Prothrombin Time: 13.2 seconds (ref 11.4–15.2)

## 2022-05-23 LAB — BASIC METABOLIC PANEL
Anion gap: 6 (ref 5–15)
BUN: <5 mg/dL — ABNORMAL LOW (ref 8–23)
CO2: 24 mmol/L (ref 22–32)
Calcium: 9.4 mg/dL (ref 8.9–10.3)
Chloride: 110 mmol/L (ref 98–111)
Creatinine, Ser: 0.5 mg/dL (ref 0.44–1.00)
GFR, Estimated: >60 mL/min (ref >60)
Glucose, Bld: 97 mg/dL (ref 70–99)
Potassium: 3.8 mmol/L (ref 3.5–5.1)
Sodium: 140 mmol/L (ref 135–145)

## 2022-05-23 MED ORDER — METRONIDAZOLE 500 MG PO TABS
500.0000 mg | ORAL_TABLET | Freq: Four times a day (QID) | ORAL | Status: AC
  Administered 2022-05-23 (×3): 500 mg via ORAL
  Filled 2022-05-23 (×3): qty 1

## 2022-05-23 MED ORDER — SODIUM CHLORIDE 0.9 % IV SOLN
2.0000 g | INTRAVENOUS | Status: AC
  Administered 2022-05-24: 2 g via INTRAVENOUS
  Filled 2022-05-23: qty 2

## 2022-05-23 MED ORDER — NEOMYCIN SULFATE 500 MG PO TABS
1000.0000 mg | ORAL_TABLET | Freq: Four times a day (QID) | ORAL | Status: AC
  Administered 2022-05-23 (×3): 1000 mg via ORAL
  Filled 2022-05-23 (×3): qty 2

## 2022-05-23 NOTE — Progress Notes (Signed)
Mobility Specialist - Progress Note   05/23/22 1300  Mobility  Activity Ambulated independently in hallway  Level of Assistance Independent  Assistive Device None  Distance Ambulated (ft) 480 ft  Activity Response Tolerated well  $Mobility charge 1 Mobility    Merrily Brittle Mobility Specialist 05/23/22, 1:26 PM

## 2022-05-23 NOTE — Progress Notes (Signed)
05/23/2022  Subjective: No acute events.  The patient reports the bowel prep is working well and her stool is liquid.  Denies any abdominal pain, nausea, or vomiting.  She's scheduled for surgery tomorrow.  Vital signs: Temp:  [97.8 F (36.6 C)-99.1 F (37.3 C)] 97.9 F (36.6 C) (06/18 1000) Pulse Rate:  [84-94] 84 (06/18 1000) Resp:  [18-20] 18 (06/18 1000) BP: (126-139)/(76-79) 128/76 (06/18 1000) SpO2:  [99 %-100 %] 99 % (06/18 1000) Weight:  [79.8 kg] 79.8 kg (06/18 0428)   Intake/Output: 06/17 0701 - 06/18 0700 In: 598 [P.O.:598] Out: -  Last BM Date : 05/23/22  Physical Exam: Constitutional: No acute distress Abdomen:  soft, non-distended, non-tender.  Labs:  No results for input(s): "WBC", "HGB", "HCT", "PLT" in the last 72 hours. Recent Labs    05/22/22 0646 05/23/22 0601  NA 140 140  K 4.1 3.8  CL 110 110  CO2 25 24  GLUCOSE 102* 97  BUN <5* <5*  CREATININE 0.38* 0.50  CALCIUM 9.0 9.4   No results for input(s): "LABPROT", "INR" in the last 72 hours.  Imaging: No results found.  Assessment/Plan: This is a 61 y.o. female with partial large bowel obstruction.  --Discussed with the patient again the plan for surgery tomorrow.  The plan would be for a robotic assisted sigmoidectomy, with possible end colostomy if unable to make a primary colorectal anastomosis, possible diverting loop ileostomy if able to do a primary colorectal anastomosis, and possible open surgery.  Given her history of uterine cancer, s/p hysterectomy and chemoradiation therapy, the pelvis may be very scarred down.  Discussed with her that Urology would also assist [place] in doing a cystoscopy and injection of ICG into the ureters for better visualization intraop.  Reviewed the surgery at length with her including the risks of bleeding, infection, injury to surrounding structures, post-op pain control and activity restrictions, hospital stay, and she's willing to proceed. --Given the potential for  ostomies, I will place consult for [place] RN to mark her for the potential of both an end colostomy in the left lower quadrant and for a diverting loop ileostomy in the right lower quadrant. --Will order NPO diet after midnight, will order preop IV abx and bowel prep abx to take today.  Continue golytely bowel prep today.  Will also start gentle IV fluid hydration at midnight.  Consent order placed as well. --Will obtain labs in AM to include T&S. --All questions have been answered.   I spent 50 minutes dedicated to the care of this patient on the date of this encounter to include pre-visit review of records, face-to-face time with the patient discussing diagnosis and management, and any post-visit coordination of care.  Melvyn Neth, [facility]

## 2022-05-23 NOTE — Progress Notes (Signed)
  Progress Note   Patient: Elizabeth Martin [id number]:[id number] DOB: 1961-04-16 DOA: 05/18/2022     5 DOS: the patient was seen and examined on 05/23/2022   Brief hospital course: Elizabeth Martin is a 61 y.o. female with medical history significant of asthma, endometrial cancer, history of VTE's and hypertension coming in with abdominal pain and constipation.  Patient has a history of abdominal pain going on for few days getting worse along with nausea and vomiting.  CT scan of abdomen/pelvis with contrast showed large bowel obstruction with a transition point at the sigmoid colon.  Patient has been seen by general surgery, suspect diverticulosis with stricture.  Patient had a partial sigmoidoscopy 6/14.  Showed sigmoid diverticulosis without diverticulitis.  Could not advance further.  However, patient was given multiple stool softener and a laxative, she started to have large amount of bowel movements.  Symptom has resolved since 6/15. Surgery is scheduling for sigmoidoscopy and a partial diversion ostomy.  Assessment and Plan: Large bowel obstruction likely secondary to colon stricture. Nausea and vomiting secondary to bowel obstruction. Condition much improved.  Scheduled on surgery on Monday.   Hypokalemia. Improved.  Essential hypertension. Amlodipine.   Right leg swelling. 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  Since symptoms getting worse, repeat ultrasound.     Subjective:  Complaining of right leg edema, otherwise no complaints.  Physical Exam: Vitals:   05/22/22 0732 05/22/22 1915 05/23/22 0428 05/23/22 1000  BP: 123/76 126/78 139/79 128/76  Pulse: 79 85 94 84  Resp: '18 18 20 18  '$ Temp: 97.9 F (36.6 C) 99.1 F (37.3 C) 97.8 F (36.6 C) 97.9 F (36.6 C)  TempSrc: Oral Oral Oral Oral  SpO2: 98% 100% 100% 99%  Weight:   79.8 kg   Height:       General exam: Appears calm and comfortable  Respiratory system: Clear  to auscultation. Respiratory effort normal. Cardiovascular system: S1 & S2 heard, RRR. No JVD, murmurs, rubs, gallops or clicks. No pedal edema. Gastrointestinal system: Abdomen is nondistended, soft and nontender. No organomegaly or masses felt. Normal bowel sounds heard. Central nervous system: Alert and oriented. No focal neurological deficits. Extremities: Right leg edema Skin: No rashes, lesions or ulcers Psychiatry: Judgement and insight appear normal. Mood & affect appropriate.   Data Reviewed:  Lab results reviewed  Family Communication: Husband updated at bedside  Disposition: Status is: Inpatient Remains inpatient appropriate because: Severity of disease, pending procedure  Planned Discharge Destination: Home    Time spent: 28 minutes  Author: Sharen Hones, MD 05/23/2022 1:48 PM  For on call review [email or web address].

## 2022-05-24 ENCOUNTER — Inpatient Hospital Stay: Payer: BC Managed Care – PPO

## 2022-05-24 ENCOUNTER — Encounter: Payer: Self-pay | Admitting: Internal Medicine

## 2022-05-24 ENCOUNTER — Other Ambulatory Visit: Payer: Self-pay

## 2022-05-24 ENCOUNTER — Encounter
Admission: EM | Disposition: A | Discharge status: Home / Self Care | Payer: Self-pay | Source: Home / Self Care | Attending: Internal Medicine

## 2022-05-24 DIAGNOSIS — K56699 Other intestinal obstruction unspecified as to partial versus complete obstruction: Secondary | ICD-10-CM | POA: Diagnosis not present

## 2022-05-24 DIAGNOSIS — R1030 Lower abdominal pain, unspecified: Secondary | ICD-10-CM | POA: Diagnosis not present

## 2022-05-24 DIAGNOSIS — I10 Essential (primary) hypertension: Secondary | ICD-10-CM | POA: Diagnosis not present

## 2022-05-24 DIAGNOSIS — S0501XA Injury of conjunctiva and corneal abrasion without foreign body, right eye, initial encounter: Secondary | ICD-10-CM

## 2022-05-24 HISTORY — PX: XI ROBOTIC ASSISTED LOWER ANTERIOR RESECTION: SHX6558

## 2022-05-24 HISTORY — DX: Injury of conjunctiva and corneal abrasion without foreign body, right eye, initial encounter: S05.01XA

## 2022-05-24 LAB — CBC
HCT: 34.5 % — ABNORMAL LOW (ref 36.0–46.0)
Hemoglobin: 11.3 g/dL — ABNORMAL LOW (ref 12.0–15.0)
MCH: 29.4 pg (ref 26.0–34.0)
MCHC: 32.8 g/dL (ref 30.0–36.0)
MCV: 89.6 fL (ref 80.0–100.0)
Platelets: 301 10*3/uL (ref 150–400)
RBC: 3.85 MIL/uL — ABNORMAL LOW (ref 3.87–5.11)
RDW: 13.2 % (ref 11.5–15.5)
WBC: 5.5 10*3/uL (ref 4.0–10.5)
nRBC: 0 % (ref 0.0–0.2)

## 2022-05-24 LAB — BASIC METABOLIC PANEL
Anion gap: 6 (ref 5–15)
BUN: <5 mg/dL — ABNORMAL LOW (ref 8–23)
CO2: 22 mmol/L (ref 22–32)
Calcium: 9.1 mg/dL (ref 8.9–10.3)
Chloride: 112 mmol/L — ABNORMAL HIGH (ref 98–111)
Creatinine, Ser: 0.47 mg/dL (ref 0.44–1.00)
GFR, Estimated: >60 mL/min (ref >60)
Glucose, Bld: 97 mg/dL (ref 70–99)
Potassium: 4 mmol/L (ref 3.5–5.1)
Sodium: 140 mmol/L (ref 135–145)

## 2022-05-24 LAB — TYPE AND SCREEN
ABO/RH(D): O NEG
Antibody Screen: NEGATIVE

## 2022-05-24 LAB — MAGNESIUM: Magnesium: 2.1 mg/dL (ref 1.7–2.4)

## 2022-05-24 SURGERY — RESECTION, RECTUM, LOW ANTERIOR, ROBOT-ASSISTED
Anesthesia: General | Site: Ureter

## 2022-05-24 MED ORDER — ROCURONIUM BROMIDE 10 MG/ML (PF) SYRINGE
PREFILLED_SYRINGE | INTRAVENOUS | Status: AC
  Filled 2022-05-24: qty 10

## 2022-05-24 MED ORDER — BUPIVACAINE-EPINEPHRINE (PF) 0.5% -1:200000 IJ SOLN
INTRAMUSCULAR | Status: DC | PRN
  Administered 2022-05-24: 50 mL

## 2022-05-24 MED ORDER — SODIUM CHLORIDE 0.9 % IV SOLN
INTRAVENOUS | Status: AC
  Filled 2022-05-24: qty 2

## 2022-05-24 MED ORDER — TETRACAINE HCL 0.5 % OP SOLN
OPHTHALMIC | Status: AC
  Filled 2022-05-24: qty 4

## 2022-05-24 MED ORDER — ONDANSETRON HCL 4 MG/2ML IJ SOLN
INTRAMUSCULAR | Status: DC | PRN
  Administered 2022-05-24 (×2): 4 mg via INTRAVENOUS

## 2022-05-24 MED ORDER — STERILE WATER FOR INJECTION IV SOLN
INTRAMUSCULAR | Status: DC | PRN
  Administered 2022-05-24: 1 mL

## 2022-05-24 MED ORDER — DEXAMETHASONE SODIUM PHOSPHATE 10 MG/ML IJ SOLN
INTRAMUSCULAR | Status: DC | PRN
  Administered 2022-05-24: 10 mg via INTRAVENOUS

## 2022-05-24 MED ORDER — ONDANSETRON HCL 4 MG/2ML IJ SOLN
INTRAMUSCULAR | Status: AC
  Filled 2022-05-24: qty 2

## 2022-05-24 MED ORDER — HYDROMORPHONE HCL 1 MG/ML IJ SOLN
INTRAMUSCULAR | Status: AC
  Administered 2022-05-24: 0.25 mg via INTRAVENOUS
  Filled 2022-05-24: qty 1

## 2022-05-24 MED ORDER — HYDROMORPHONE HCL 1 MG/ML IJ SOLN
INTRAMUSCULAR | Status: AC
  Filled 2022-05-24: qty 1

## 2022-05-24 MED ORDER — LACTATED RINGERS IV SOLN: INTRAVENOUS | Status: DC | PRN

## 2022-05-24 MED ORDER — KETOROLAC TROMETHAMINE 0.5 % OP SOLN
1.0000 [drp] | Freq: Four times a day (QID) | OPHTHALMIC | Status: AC | PRN
  Administered 2022-05-24: 1 [drp] via OPHTHALMIC
  Filled 2022-05-24: qty 3

## 2022-05-24 MED ORDER — KETAMINE HCL 50 MG/5ML IJ SOSY
PREFILLED_SYRINGE | INTRAMUSCULAR | Status: AC
  Filled 2022-05-24: qty 5

## 2022-05-24 MED ORDER — ACETAMINOPHEN 10 MG/ML IV SOLN
INTRAVENOUS | Status: DC | PRN
  Administered 2022-05-24: 1000 mg via INTRAVENOUS

## 2022-05-24 MED ORDER — SODIUM CHLORIDE FLUSH 0.9 % IV SOLN
INTRAVENOUS | Status: AC
  Filled 2022-05-24: qty 3

## 2022-05-24 MED ORDER — SODIUM CHLORIDE 0.9 % IR SOLN
Status: DC | PRN
  Administered 2022-05-24: 300 mL

## 2022-05-24 MED ORDER — ESMOLOL HCL 100 MG/10ML IV SOLN
INTRAVENOUS | Status: DC | PRN
  Administered 2022-05-24 (×3): 10 mg via INTRAVENOUS

## 2022-05-24 MED ORDER — ACETAMINOPHEN 10 MG/ML IV SOLN
INTRAVENOUS | Status: AC
  Filled 2022-05-24: qty 100

## 2022-05-24 MED ORDER — ROCURONIUM BROMIDE 100 MG/10ML IV SOLN
INTRAVENOUS | Status: DC | PRN
  Administered 2022-05-24: 30 mg via INTRAVENOUS
  Administered 2022-05-24: 20 mg via INTRAVENOUS
  Administered 2022-05-24: 50 mg via INTRAVENOUS
  Administered 2022-05-24: 20 mg via INTRAVENOUS

## 2022-05-24 MED ORDER — BSS IO SOLN
15.0000 mL | Freq: Once | INTRAOCULAR | Status: AC
  Administered 2022-05-24: 15 mL
  Filled 2022-05-24: qty 15

## 2022-05-24 MED ORDER — BUPIVACAINE LIPOSOME 1.3 % IJ SUSP
INTRAMUSCULAR | Status: AC
  Filled 2022-05-24: qty 20

## 2022-05-24 MED ORDER — MIDAZOLAM HCL 2 MG/2ML IJ SOLN
INTRAMUSCULAR | Status: DC | PRN
  Administered 2022-05-24: 2 mg via INTRAVENOUS

## 2022-05-24 MED ORDER — HYDROMORPHONE HCL 1 MG/ML IJ SOLN
INTRAMUSCULAR | Status: DC | PRN
  Administered 2022-05-24 (×4): .5 mg via INTRAVENOUS
  Administered 2022-05-24: 1 mg via INTRAVENOUS

## 2022-05-24 MED ORDER — METOPROLOL TARTRATE 5 MG/5ML IV SOLN
INTRAVENOUS | Status: AC
  Filled 2022-05-24: qty 5

## 2022-05-24 MED ORDER — PHENYLEPHRINE 80 MCG/ML (10ML) SYRINGE FOR IV PUSH (FOR BLOOD PRESSURE SUPPORT)
PREFILLED_SYRINGE | INTRAVENOUS | Status: AC
  Filled 2022-05-24: qty 10

## 2022-05-24 MED ORDER — PROPOFOL 10 MG/ML IV BOLUS
INTRAVENOUS | Status: DC | PRN
  Administered 2022-05-24: 150 mg via INTRAVENOUS

## 2022-05-24 MED ORDER — HYDROMORPHONE HCL 1 MG/ML IJ SOLN
0.2500 mg | INTRAMUSCULAR | Status: DC | PRN
  Administered 2022-05-24: 0.25 mg via INTRAVENOUS

## 2022-05-24 MED ORDER — KETAMINE HCL 10 MG/ML IJ SOLN
INTRAMUSCULAR | Status: DC | PRN
  Administered 2022-05-24: 10 mg via INTRAVENOUS
  Administered 2022-05-24: 25 mg via INTRAVENOUS
  Administered 2022-05-24 (×2): 20 mg via INTRAVENOUS
  Administered 2022-05-24: 25 mg via INTRAVENOUS
  Administered 2022-05-24: 20 mg via INTRAVENOUS

## 2022-05-24 MED ORDER — BUPIVACAINE-EPINEPHRINE (PF) 0.5% -1:200000 IJ SOLN
INTRAMUSCULAR | Status: AC
  Filled 2022-05-24: qty 30

## 2022-05-24 MED ORDER — ONDANSETRON HCL 4 MG/2ML IJ SOLN: 4.0000 mg | Freq: Once | INTRAMUSCULAR | Status: DC | PRN

## 2022-05-24 MED ORDER — LIDOCAINE HCL (CARDIAC) PF 100 MG/5ML IV SOSY
PREFILLED_SYRINGE | INTRAVENOUS | Status: DC | PRN
  Administered 2022-05-24: 50 mg via INTRAVENOUS

## 2022-05-24 MED ORDER — PHENYLEPHRINE HCL-NACL 20-0.9 MG/250ML-% IV SOLN
INTRAVENOUS | Status: AC
  Filled 2022-05-24: qty 250

## 2022-05-24 MED ORDER — 0.9 % SODIUM CHLORIDE (POUR BTL) OPTIME
TOPICAL | Status: DC | PRN
  Administered 2022-05-24: 500 mL

## 2022-05-24 MED ORDER — ESMOLOL HCL 100 MG/10ML IV SOLN
INTRAVENOUS | Status: AC
  Filled 2022-05-24: qty 10

## 2022-05-24 MED ORDER — MIDAZOLAM HCL 2 MG/2ML IJ SOLN
INTRAMUSCULAR | Status: AC
  Filled 2022-05-24: qty 2

## 2022-05-24 MED ORDER — SUGAMMADEX SODIUM 200 MG/2ML IV SOLN
INTRAVENOUS | Status: DC | PRN
  Administered 2022-05-24: 200 mg via INTRAVENOUS

## 2022-05-24 MED ORDER — PHENYLEPHRINE HCL (PRESSORS) 10 MG/ML IV SOLN
INTRAVENOUS | Status: DC | PRN
  Administered 2022-05-24 (×2): 80 ug via INTRAVENOUS

## 2022-05-24 MED ORDER — TETRACAINE HCL 0.5 % OP SOLN
1.0000 [drp] | Freq: Once | OPHTHALMIC | Status: DC
  Filled 2022-05-24: qty 4

## 2022-05-24 MED ORDER — METOPROLOL TARTRATE 5 MG/5ML IV SOLN
INTRAVENOUS | Status: DC | PRN
  Administered 2022-05-24 (×2): 1 mg via INTRAVENOUS

## 2022-05-24 SURGICAL SUPPLY — 116 items
BAG DRAIN CYSTO-URO LG1000N (MISCELLANEOUS) ×1 IMPLANT
BAG LAPAROSCOPIC 12 15 PORT 16 (BASKET) IMPLANT
BAG RETRIEVAL 12/15 (BASKET)
BASIN KIT SINGLE STR (MISCELLANEOUS) ×1 IMPLANT
BLADE SURG SZ11 CARB STEEL (BLADE) ×3 IMPLANT
BRUSH SCRUB EZ 1% IODOPHOR (MISCELLANEOUS) ×1 IMPLANT
CANNULA REDUC XI 12-8 STAPL (CANNULA) ×1
CANNULA REDUCER 12-8 DVNC XI (CANNULA) ×2 IMPLANT
CATH ROBINSON RED A/P 14FR (CATHETERS) ×1 IMPLANT
CLIP LIGATING HEM O LOK PURPLE (MISCELLANEOUS) IMPLANT
COVER MAYO STAND REUSABLE (DRAPES) IMPLANT
COVER TIP SHEARS 8 DVNC (MISCELLANEOUS) IMPLANT
COVER TIP SHEARS 8MM DA VINCI (MISCELLANEOUS) ×1
DRAPE ARM DVNC X/XI (DISPOSABLE) ×8 IMPLANT
DRAPE COLUMN DVNC XI (DISPOSABLE) ×2 IMPLANT
DRAPE DA VINCI XI ARM (DISPOSABLE) ×4
DRAPE DA VINCI XI COLUMN (DISPOSABLE) ×1
DRAPE LEGGINS SURG 28X43 STRL (DRAPES) ×3 IMPLANT
DRAPE UNDER BUTTOCK W/FLU (DRAPES) ×3 IMPLANT
DRSG OPSITE POSTOP 3X4 (GAUZE/BANDAGES/DRESSINGS) ×11 IMPLANT
DRSG OPSITE POSTOP 4X6 (GAUZE/BANDAGES/DRESSINGS) ×3 IMPLANT
ELECT CAUTERY BLADE TIP 2.5 (TIP) ×3
ELECT EZSTD 165MM 6.5IN (MISCELLANEOUS)
ELECT REM PT RETURN 9FT ADLT (ELECTROSURGICAL) ×3
ELECTRODE CAUTERY BLDE TIP 2.5 (TIP) ×2 IMPLANT
ELECTRODE EZSTD 165MM 6.5IN (MISCELLANEOUS) IMPLANT
ELECTRODE REM PT RTRN 9FT ADLT (ELECTROSURGICAL) ×2 IMPLANT
GLOVE BIO SURGEON STRL SZ7.5 (GLOVE) ×3 IMPLANT
GLOVE SURG SYN 7.0 (GLOVE) ×9 IMPLANT
GLOVE SURG SYN 7.0 PF PI (GLOVE) ×6 IMPLANT
GLOVE SURG SYN 7.5  E (GLOVE) ×3
GLOVE SURG SYN 7.5 E (GLOVE) ×6 IMPLANT
GLOVE SURG SYN 7.5 PF PI (GLOVE) ×6 IMPLANT
GLOVE SURG UNDER POLY LF SZ7.5 (GLOVE) ×1 IMPLANT
GOWN STRL REUS W/ TWL LRG LVL3 (GOWN DISPOSABLE) ×10 IMPLANT
GOWN STRL REUS W/ TWL XL LVL3 (GOWN DISPOSABLE) IMPLANT
GOWN STRL REUS W/TWL LRG LVL3 (GOWN DISPOSABLE) ×5
GOWN STRL REUS W/TWL XL LVL3 (GOWN DISPOSABLE) ×2
GRASPER LAPSCPC 5X45 DSP (INSTRUMENTS) ×3 IMPLANT
GUIDEWIRE STR DUAL SENSOR (WIRE) ×1 IMPLANT
HANDLE YANKAUER SUCT BULB TIP (MISCELLANEOUS) ×3 IMPLANT
IRRIGATION STRYKERFLOW (MISCELLANEOUS) IMPLANT
IRRIGATOR STRYKERFLOW (MISCELLANEOUS) ×3
IRRIGATOR SUCT 8 DISP DVNC XI (IRRIGATION / IRRIGATOR) IMPLANT
IRRIGATOR SUCTION 8MM XI DISP (IRRIGATION / IRRIGATOR)
IV NS 1000ML (IV SOLUTION) ×1
IV NS 1000ML BAXH (IV SOLUTION) IMPLANT
IV NS IRRIG 3000ML ARTHROMATIC (IV SOLUTION) ×1 IMPLANT
KIT IMAGING PINPOINTPAQ (MISCELLANEOUS) IMPLANT
KIT OSTOMY 2 PC DRNBL 2.25 STR (WOUND CARE) IMPLANT
KIT OSTOMY DRAINABLE 2.25 STR (WOUND CARE) ×1
KIT PINK PAD W/HEAD ARE REST (MISCELLANEOUS) ×3
KIT PINK PAD W/HEAD ARM REST (MISCELLANEOUS) ×2 IMPLANT
LABEL OR SOLS (LABEL) ×3 IMPLANT
MANIFOLD NEPTUNE II (INSTRUMENTS) ×3 IMPLANT
NDL INSUFFLATION 14GA 120MM (NEEDLE) ×2 IMPLANT
NEEDLE HYPO 22GX1.5 SAFETY (NEEDLE) ×4 IMPLANT
NEEDLE INSUFFLATION 14GA 120MM (NEEDLE) ×3 IMPLANT
NS IRRIG 500ML POUR BTL (IV SOLUTION) ×3 IMPLANT
OBTURATOR OPTICAL STANDARD 8MM (TROCAR) ×1
OBTURATOR OPTICAL STND 8 DVNC (TROCAR) ×2
OBTURATOR OPTICALSTD 8 DVNC (TROCAR) IMPLANT
PACK COLON CLEAN CLOSURE (MISCELLANEOUS) ×3 IMPLANT
PACK CYSTO AR (MISCELLANEOUS) ×1 IMPLANT
PACK LAP CHOLECYSTECTOMY (MISCELLANEOUS) ×3 IMPLANT
PAD PREP 24X41 OB/GYN DISP (PERSONAL CARE ITEMS) ×3 IMPLANT
PENCIL ELECTRO HAND CTR (MISCELLANEOUS) ×3 IMPLANT
RELOAD STAPLE 45 3.5 BLU DVNC (STAPLE) IMPLANT
RELOAD STAPLER 3.5X45 BLU DVNC (STAPLE) ×6 IMPLANT
RETRACTOR RING XSMALL (MISCELLANEOUS) IMPLANT
RETRACTOR WOUND ALXS 18CM SML (MISCELLANEOUS) IMPLANT
RTRCTR WOUND ALEXIS 13CM XS SH (MISCELLANEOUS) ×3
RTRCTR WOUND ALEXIS O 18CM SML (MISCELLANEOUS)
SEAL CANN UNIV 5-8 DVNC XI (MISCELLANEOUS) ×6 IMPLANT
SEAL XI 5MM-8MM UNIVERSAL (MISCELLANEOUS) ×3
SEALER VESSEL DA VINCI XI (MISCELLANEOUS) ×1
SEALER VESSEL EXT DVNC XI (MISCELLANEOUS) ×2 IMPLANT
SOL PREP PVP 2OZ (MISCELLANEOUS) ×3
SOLUTION ELECTROLUBE (MISCELLANEOUS) ×3 IMPLANT
SOLUTION PREP PVP 2OZ (MISCELLANEOUS) ×2 IMPLANT
SPIKE FLUID TRANSFER (MISCELLANEOUS) ×3 IMPLANT
SPONGE T-LAP 18X18 ~~LOC~~+RFID (SPONGE) ×9 IMPLANT
SPONGE T-LAP 4X18 ~~LOC~~+RFID (SPONGE) IMPLANT
STAPLER 45 DA VINCI SURE FORM (STAPLE) ×1
STAPLER 45 SUREFORM DVNC (STAPLE) IMPLANT
STAPLER CANNULA SEAL DVNC XI (STAPLE) ×2 IMPLANT
STAPLER CANNULA SEAL XI (STAPLE) ×1
STAPLER CIRCULAR MANUAL XL 25 (STAPLE) ×1 IMPLANT
STAPLER CIRCULAR MANUAL XL 29 (STAPLE) IMPLANT
STAPLER CIRCULAR MANUAL XL 33 (STAPLE) IMPLANT
STAPLER RELOAD 3.5X45 BLU DVNC (STAPLE) ×6
STAPLER RELOAD 3.5X45 BLUE (STAPLE) ×3
STAPLER SKIN PROX 35W (STAPLE) ×3 IMPLANT
SURGILUBE 2OZ TUBE FLIPTOP (MISCELLANEOUS) ×4 IMPLANT
SUT PDS AB 0 CT1 27 (SUTURE) ×1 IMPLANT
SUT PDS AB 1 CT1 36 (SUTURE) ×6 IMPLANT
SUT PROLENE 2 0 SH DA (SUTURE) ×1 IMPLANT
SUT SILK 2 0 (SUTURE) ×1
SUT SILK 2-0 18XBRD TIE 12 (SUTURE) IMPLANT
SUT VIC AB 2-0 SH 27 (SUTURE) ×4
SUT VIC AB 2-0 SH 27XBRD (SUTURE) ×2 IMPLANT
SUT VIC AB 3-0 SH 27 (SUTURE) ×1
SUT VIC AB 3-0 SH 27X BRD (SUTURE) ×2 IMPLANT
SUT VICRYL 3-0 CR8 SH (SUTURE) ×1 IMPLANT
SUT VLOC 90 6 CV-15 VIOLET (SUTURE) ×3 IMPLANT
SYR TOOMEY 50ML (SYRINGE) ×1 IMPLANT
SYR TOOMEY IRRIG 70ML (MISCELLANEOUS) ×6
SYRINGE TOOMEY IRRIG 70ML (MISCELLANEOUS) IMPLANT
SYS KII FIOS ACCESS ABD 5X100 (TROCAR) ×3
SYS TROCAR 1.5-3 SLV ABD GEL (ENDOMECHANICALS)
SYSTEM KII FIOS ACES ABD 5X100 (TROCAR) ×2 IMPLANT
SYSTEM TROCR 1.5-3 SLV ABD GEL (ENDOMECHANICALS) IMPLANT
TRAY FOLEY MTR SLVR 16FR STAT (SET/KITS/TRAYS/PACK) ×3 IMPLANT
TRAY FOLEY SLVR 16FR LF STAT (SET/KITS/TRAYS/PACK) ×3 IMPLANT
TUBING EVAC SMOKE HEATED PNEUM (TUBING) ×3 IMPLANT
WATER STERILE IRR 500ML POUR (IV SOLUTION) ×3 IMPLANT

## 2022-05-24 NOTE — Progress Notes (Signed)
BSS solution 64m flush into right eye, ketorolac one gtt right eye as ordered, will continue to monitor. Dr. JWynetta Emery anesthesia aware, will continue to monitor.

## 2022-05-24 NOTE — Progress Notes (Signed)
[facility] Hospital Day(s): 6.   Post op day(s): 5 Days Post-Op.   Interval History:  Patient seen and examined No acute events or new complaints overnight.  Patient reports she is doing well; some abdominal soreness but attributes this to her ecchymosis from receiving her DVT prophylaxis injections  No fever, chills, nausea, emesis  Labs this morning are reassuring She has responded well to bowel regimen over the weekend Plan for robotic assisted laparoscopic sigmoid colectomy and possible ostomy this afternoon with Dr Hampton Abbot She is NPO this morning   Vital signs in last 24 hours: [min-max] current  Temp:  [97.9 F (36.6 C)-98.1 F (36.7 C)] 98.1 F (36.7 C) (06/19 0448) Pulse Rate:  [80-89] 86 (06/19 0448) Resp:  [18] 18 (06/19 0448) BP: (118-140)/(67-82) 118/67 (06/19 0448) SpO2:  [98 %-100 %] 98 % (06/19 0448)     Height: '5\' 7"'$  (170.2 cm) Weight: 79.8 kg BMI (Calculated): 27.55   Intake/Output last 2 shifts:  06/18 0701 - 06/19 0700 In: 240 [P.O.:240] Out: -    Physical Exam:  Constitutional: alert, cooperative and no distress  Respiratory: breathing non-labored at rest  Cardiovascular: regular rate and sinus rhythm  Gastrointestinal: soft, non-tender, and non-distended, no rebound/guarding  Integumentary: warm, dry. Interestingly, she has ecchymosis across her lower abdomen from her Lovenox injections, this is worse in the Right lower lateral abdomen, there is swelling, consistent with likely hematoma, chronic induration to the skin in the lower abdomen as well consistent with her known connective tissue disorders.   Labs:     Latest Ref Rng & Units 05/24/2022    4:31 AM 05/19/2022    3:24 AM 05/18/2022    7:13 PM  CBC  WBC 4.0 - 10.5 K/uL 5.5  9.6  8.0   Hemoglobin 12.0 - 15.0 g/dL 11.3  12.5  14.1   Hematocrit 36.0 - 46.0 % 34.5  37.3  41.6   Platelets 150 - 400 K/uL 301  309  336       Latest Ref Rng & Units 05/24/2022     4:31 AM 05/23/2022    6:01 AM 05/22/2022    6:46 AM  CMP  Glucose 70 - 99 mg/dL 97  97  102   BUN 8 - 23 mg/dL <5  <5  <5   Creatinine 0.44 - 1.00 mg/dL 0.47  0.50  0.38   Sodium 135 - 145 mmol/L 140  140  140   Potassium 3.5 - 5.1 mmol/L 4.0  3.8  4.1   Chloride 98 - 111 mmol/L 112  110  110   CO2 22 - 32 mmol/L '22  24  25   '$ Calcium 8.9 - 10.3 mg/dL 9.1  9.4  9.0     Imaging studies: No new pertinent imaging studies   Assessment/Plan: (ICD-10's: K33.609) 61 y.o. female with partial large bowel obstruction secondary to stricture of the sigmoid colon; day of surgery    - Plan for robotic assisted laparoscopic sigmoid colectomy with EEA, possible ileostomy, possible end colostomy, possible laparotomy today with Dr Hampton Abbot this afternoon pending OR/Anesthesia availability - Dr Hampton Abbot discussed all risks, benefits, and alternatives to above procedure(s) were discussed with the patient, all of her questions were answered to her expressed satisfaction, patient expresses she wishes to proceed, and informed consent was obtained.   - WOC RN at bedside; appreciate marking  - NPO + IVF Resuscitation   - Perioperative Abx (Cefotetan)   - Monitor abdominal examination;  on-going bowel function - Pain control prn; antiemetics prn - Mobilization as tolerated - Further management per primary service; we will of course follow    All of the above findings and recommendations were discussed with the patient, patient's family at bedside, and the medical team, and all of their questions were answered to their expressed satisfaction.  -- Edison Simon, PA-C [facility] 05/24/2022, 7:18 AM M-F: 7am - 4pm

## 2022-05-24 NOTE — Progress Notes (Signed)
  Progress Note   Patient: Elizabeth Martin [id number]:[id number] DOB: 11-26-1961 DOA: 05/18/2022     6 DOS: the patient was seen and examined on 05/24/2022   Brief hospital course: Elizabeth Martin is a 61 y.o. female with medical history significant of asthma, endometrial cancer, history of VTE's and hypertension coming in with abdominal pain and constipation.  Patient has a history of abdominal pain going on for few days getting worse along with nausea and vomiting.  CT scan of abdomen/pelvis with contrast showed large bowel obstruction with a transition point at the sigmoid colon.  Patient has been seen by general surgery, suspect diverticulosis with stricture.  Patient had a partial sigmoidoscopy 6/14.  Showed sigmoid diverticulosis without diverticulitis.  Could not advance further.  However, patient was given multiple stool softener and a laxative, she started to have large amount of bowel movements.  Symptom has resolved since 6/15. Surgery is scheduling for sigmoidoscopy and a partial diversion ostomy.  Assessment and Plan: Large bowel obstruction likely secondary to colon stricture. Nausea and vomiting secondary to bowel obstruction. Patient is having surgery today.  Doing well currently no symptoms   Hypokalemia. Recheck a BMP and magnesium tomorrow  Essential hypertension. Amlodipine.   Right leg swelling. Repeated echocardiogram did not show a DVT.      Subjective:  Patient in preop, pending surgery.  No complaints.  Physical Exam: Vitals:   05/23/22 2035 05/24/22 0448 05/24/22 0750 05/24/22 1200  BP: 140/69 118/67 129/80 117/70  Pulse: 89 86 86 82  Resp: '18 18 17 17  '$ Temp: 98.1 F (36.7 C) 98.1 F (36.7 C) 98.1 F (36.7 C) 97.7 F (36.5 C)  TempSrc: Oral Oral Oral   SpO2: 99% 98% 95% 98%  Weight:      Height:       General exam: Appears calm and comfortable  Respiratory system: Clear to auscultation. Respiratory effort normal. Cardiovascular system: S1 & S2 heard, RRR.  No JVD, murmurs, rubs, gallops or clicks. No pedal edema. Gastrointestinal system: Abdomen is nondistended, soft and nontender. No organomegaly or masses felt. Normal bowel sounds heard. Central nervous system: Alert and oriented. No focal neurological deficits. Extremities: Symmetric 5 x 5 power. Skin: No rashes, lesions or ulcers Psychiatry: Judgement and insight appear normal. Mood & affect appropriate.   Data Reviewed:  None new  Family Communication: Husband at bedside  Disposition: Status is: Inpatient Remains inpatient appropriate because: Severity of disease, pending surgery  Planned Discharge Destination: Home    Time spent: 35 minutes  Author: Sharen Hones, MD 05/24/2022 12:54 PM  For on call review [email or web address].

## 2022-05-24 NOTE — Consult Note (Signed)
[facility] Nurse requested for preoperative stoma site marking  Discussed surgical procedure and stoma creation with patient and family.  Explained role of the [place] nurse team.  Provided the patient with educational booklet and provided samples of pouching options.  Answered patient and family questions.   Examined patient sitting and standing in order to place the marking in the patient's visual field, away from any creases or abdominal contour issues and within the rectus muscle.  Attempted to mark above the patient's belt line.   Marked for colostomy in the LMQ  __4__ cm to the left of the umbilicus and even with the umbilicus.  Marked for ileostomy in the RMQ  __4__cm to the right of the umbilicus and even with the umbilicus.   Patient's abdomen cleansed with CHG wipes at site markings, allowed to air dry prior to marking.Covered mark with thin film transparent dressing to preserve mark until date of surgery.   [facility] Nurse team will follow up with patient after surgery for continue ostomy care and teaching.  Val Riles, RN, MSN, CWOCN, CNS-BC, pager [phone]

## 2022-05-24 NOTE — Transfer of Care (Signed)
Immediate Anesthesia Transfer of Care Note  Patient: Elizabeth Martin  Procedure(s) Performed: XI ROBOTIC ASSISTED LOWER ANTERIOR RESECTION (Abdomen) CYSTOSCOPY WITH ICG INSTILLATION (Bilateral: Ureter)  Patient Location: PACU  Anesthesia Type:General  Level of Consciousness: drowsy and patient cooperative  Airway & Oxygen Therapy: Patient Spontanous Breathing and Patient connected to face mask oxygen  Post-op Assessment: Report given to RN and Post -op Vital signs reviewed and stable  Post vital signs: Reviewed and stable  Last Vitals:  Vitals Value Taken Time  BP 160/92 05/24/22 2104  Temp    Pulse 98 05/24/22 2108  Resp 9 05/24/22 2108  SpO2 99 % 05/24/22 2108  Vitals shown include unvalidated device data.  Last Pain:  Vitals:   05/24/22 1200  TempSrc:   PainSc: 2          Complications: No notable events documented.

## 2022-05-24 NOTE — Anesthesia Procedure Notes (Signed)
Procedure Name: Intubation Date/Time: 05/24/2022 1:55 PM  Performed by: Johnna Acosta, CRNAPre-anesthesia Checklist: Patient identified, Emergency Drugs available, Suction available, Patient being monitored and Timeout performed Patient Re-evaluated:Patient Re-evaluated prior to induction Oxygen Delivery Method: Circle system utilized Preoxygenation: Pre-oxygenation with 100% oxygen Induction Type: IV induction Ventilation: Mask ventilation without difficulty Laryngoscope Size: McGraph and 3 Grade View: Grade I Tube type: Oral Tube size: 7.0 mm Number of attempts: 1 Airway Equipment and Method: Stylet and Video-laryngoscopy Placement Confirmation: ETT inserted through vocal cords under direct vision, positive ETCO2 and breath sounds checked- equal and bilateral Secured at: 20 cm Tube secured with: Tape Dental Injury: Teeth and Oropharynx as per pre-operative assessment

## 2022-05-24 NOTE — Brief Op Note (Signed)
05/24/2022  8:54 PM  PATIENT:  Elizabeth Martin  61 y.o. female  PRE-OPERATIVE DIAGNOSIS:  Partial large bowel obstruction  POST-OPERATIVE DIAGNOSIS:  Partial large bowel obstruction  PROCEDURE:  Procedure(s): XI ROBOTIC ASSISTED LOWER ANTERIOR RESECTION (N/A) CYSTOSCOPY WITH ICG INSTILLATION (Bilateral)  SURGEON:  Surgeon(s) and Role:    * Hellen Shanley, Jacqulyn Bath, MD - Primary    * Billey Co, MD - Assisting  PHYSICIAN ASSISTANT:  Edison Simon, PA-C  ANESTHESIA:   general  EBL:  100 mL   BLOOD ADMINISTERED:none  DRAINS: none   LOCAL MEDICATIONS USED:  BUPIVICAINE   SPECIMEN:  Source of Specimen:  Sigmoid colon  DISPOSITION OF SPECIMEN:  PATHOLOGY  COUNTS:  YES  DICTATION: .Dragon Dictation  PLAN OF CARE: Admit to inpatient   PATIENT DISPOSITION:  PACU - hemodynamically stable.   Delay start of Pharmacological VTE agent (>24hrs) due to surgical blood loss or risk of bleeding: yes

## 2022-05-24 NOTE — Progress Notes (Signed)
Patient transferred back to room 203, report given to [place] RN.  Patient states right eye "feels a little better" Rn aware of above.

## 2022-05-24 NOTE — Progress Notes (Signed)
Ready to transfer to patient room, patient more awake and c/o's right discomfort. Dr. Wynetta Emery: anesthesia at bedside, possible "hair" in eye. Will order bss solution to right eye and ketorolac gtt. Will administered prior to leaving pacu.  Patient awake/alert x4.

## 2022-05-24 NOTE — Op Note (Signed)
Procedure Date:  05/24/2022  Pre-operative Diagnosis:  Sigmoid colon stricture  Post-operative Diagnosis: Sigmoid colon stricture  Procedure:   Robotic assisted Sigmoidectomy with colorectal anastomosis  Mobilization of splenic flexure Creation of diverting loop ileostomy  Surgeon:  Melvyn Neth, MD  Assistant:  Edison Simon, PA-C  Anesthesia:  General endotracheal  Estimated Blood Loss:  100 ml  Specimens: Sigmoid colon  Complications:  None  Indications for Procedure:  This is a 61 y.o. female admitted on 05/18/22 with a partial large bowel obstruction.  She did not require emergent surgery and has had a slow bowel prep and is now ready for definitive surgery. Discussed with her the plan for a robotic assisted sigmoidectomy with possible colorectal anastomosis and diverting loop ileostomy vs end colostomy.  She has a history of mixed connective tissue disorder, and discussed the importance for diverting ostomy if we were able to do a primary colorectal anastomosis.  The risks of bleeding, infection, bowel injury, ureteral injury, and need for further procedures were all discussed with the patient and she was willing to proceed.  The patient had been previously marked by our [place] RN for possible ostomy.   Description of Procedure: The patient was correctly identified in the preoperative area and brought into the operating room.  The patient was placed supine with VTE prophylaxis in place.  Appropriate time-outs were performed.  Anesthesia was induced and the patient was intubated.  Appropriate antibiotics were infused.  The patient was then placed in lithotomy position.   Dr. Diamantina Providence then proceeded with a cystoscopy and injection of ICG into bilateral ureters.  Please see Dr. Doristine Counter op note for further details.  The patient was then placed in low lithotomy position.  The abdomen was prepped and draped in a sterile fashion.  Our planned port sites were marked.  We started with a  small incision in the RUQ and Veress needle was inserted and pneumoperitoneum was started.  Then, using Optiview technique, a right lateral incision was made and an 8 mm port was inserted.  Two additional 8 mm ports were placed in the medial LUQ and at the Veress needle site  A 12 mm port was placed in the right lower quadrant.  A 5 mm assist port was placed in the right lateral abdominal wall.  Diagnostic laparoscopy did not reveal any bowel injury and were able to sweep the omentum and part of the small bowel cephalad.  The DaVinci platform was then docked, camera targeted, and instruments placed under direct visualization.  We started first by fully restoring normal anatomy by lysing adhesions between the sigmoid and descending colon to the left lower lateral abdominal wall using cautery.  Once this was accomplished, the sigmoid colon was tented anteriorly to expose the junction between the peritoneum and retroperitoneum.  This was scored with cautery and our medial to lateral dissection was started.  Dissection laterally exposed the left ureter and this was confirmed by using FireFly.  Then we proceeded superiorly taking down the sigmoid colon mesentery using Vessel sealer towards the descending colon.  We then turned our attention distally and the rectosigmoid junction was very tortuous and was densely adhered to the pelvis.  The patient had had a prior hysterectomy and pelvic radiation.  The adhesions were meticulously taken down using combination of sharp dissection, cautery, and vessel sealer.  Once the rectosigmoid junction was finally freed, we proceeded with taking the mesentery down using the vessel sealer.  FireFly was used multiple times to  ensure preservation of the left ureter without any injury.  Distally, we then found a spot in the proximal rectum without any thickening or inflammatory changes and dissected the mesentery all the way down to that spot.  Proximally, we started mobilization  laterally of the descending colon along the [employer] towards the splenic flexure.  The splenocolic attachments were taken down using vessel sealer.  The patient's splenic flexure was very tortuous as well.  Once the splenic flexure was mobilized, we noted that the descending colon would reach very well to the pelvis for the planned colorectal anastomosis.  Vessel sealer was then used to divide the mesentery at the junction between descending and sigmoid colon all the way to the colon wall.  2.5 mg of ICG were then injected into the patient's IV and FireFly was used to evaluate the blood flow and perfusion of the planned points of transection proximally and distally.  There was great flow into both areas.  At that point, the distal point was transected using a 45 mm blue load stapler x 2 loads.  We verified again that the proximal point of transection would reach well to the pelvis for our anastomosis.    At that point, the DaVinci platform was undocked and all instruments removed.  We created a 7 cm Pfannenstiel incision a the patient's prior site of abdominoplasty scar, and after placing a wound protector, the colon was brought out through it.  There was a clear demarcation at our planned transection site.  A purse string clamp was used to clamp the colon at the transection point and the colon was transected and sent off the field and to pathology.  Alis clamps were used to hold the open colon wall edges and serial dilators were used to determine that we would use a 25 mm EEA stapler.  The anvil was inserted and a purse string created using 2-0 Prolene.  The colon was secured to the anvil without complications.  The colon and anvil were placed back into the abdominal cavity, and the wound protector clamped to re-establish pneumoperitoneum.  Then, the rectum and anal canal were dilated using a 25 mm dilator.  The 25 mm dilator reached just distal to the staple line and we planned for the spike to deploy  anterior to the staple line.  The stapler was then inserted, spike deployed as planned, and the anvil clicked in place.  The stapler was fired and retrieved.  Then, the pelvis was filled with saline and an insufflation test was carried using a red rubber catheter inserted in the rectum.  No bubbles were noted, suggesting no anastomotic leak.  The pelvis was then irrigated and suctioned.  We then determined a point in the terminal ileum for our loop ileostomy, and that point was tagged with a laparoscopic grasper.  We then proceeded to create the abdominal wall site for the loop ileostomy in the area marked by our [place] RN.  A cylinder of skin and subcutaneous tissue was excised down to the fascia.  Then, a cruciate incision was made over the anterior rectus sheath.  The rectus muscle was split longitudinally and the posterior rectus sheath was incised in the same way.  The tagged terminal ileum was brought out using a Babcock clamp.   Window in the mesentery was created and a red rubber catheter was looped around creating a bridge.  At that point, all the ports were then removed.  We then proceeded with our clean  closure after changing gloves and gown.  50 ml of Exparel solution was infiltrated onto the peritoneum, external oblique aponeurosis, subcutaneous tissue and skin of all incisions.  For the Pfannenstiel incision, the  peritoneum was closed with running 2-0 Vicryl and the anterior fascia with running #1 PDS.  The deep dermis was approximated using 3-0 Vicryl.  The 12 mm port fascia was closed with 2-0 Vicryl and the deep dermis also approximated using 3-0 Vicryl.  The skin of all incisions was closed with stapler.    We then proceed to mature the loop ileostomy in standard [place] fashion, with the red rubber catheter acting as a bridge under the bowel.  This was done using multiple 3-0 Vicryl sutures.  Both limbs of the ostomy were digitized and neither had any complications.  The wounds were cleaned and  dressed with Honeycomb dressings.  An ostomy appliance was placed with adequate fitting.  Mr. Olean Ree assisted throughout the entire procedure, from initial port placement, to assisting with retraction and suctioning intra-operatively, colorectal anastomosis, abdominal wound closure, and diverting loop ileostomy creation.  The patient was placed in flat supine position, emerged from anesthesia and extubated and brought to the recovery room for further management.   The patient tolerated the procedure well and all counts were correct at the end of the case.    Melvyn Neth, MD

## 2022-05-24 NOTE — Op Note (Signed)
Date of procedure: 05/24/22  Preoperative diagnosis:  Bowel obstruction Stricture of sigmoid colon  Postoperative diagnosis:  Same  Procedure: Cystoscopy, bilateral ureteral instillation of ICG  Surgeon: Nickolas Madrid, MD  Anesthesia: General  Complications: None  Intraoperative findings:  Normal cystoscopy, uncomplicated bilateral ureteral instillation of ICG  EBL: None for urology portion  Specimens: None for urology portion  Drains: 54 [place] two-way Foley  Indication: Elizabeth Martin is a 61 y.o. patient with bowel obstruction thought to be secondary to a stricture of the sigmoid colon with complex prior pelvic surgery and radiation who is planning resection and possible diversion with general surgery today.  Urology was requested to place bilateral ureteral ICG to aid in ureteral identification.  After reviewing the management options for treatment, they elected to proceed with the above surgical procedure(s). We have discussed the potential benefits and risks of the procedure, side effects of the proposed treatment, the likelihood of the patient achieving the goals of the procedure, and any potential problems that might occur during the procedure or recuperation. Informed consent has been obtained.  Description of procedure:  The patient was taken to the operating room and general anesthesia was induced. SCDs were placed for DVT prophylaxis. The patient was placed in the dorsal lithotomy position, prepped and draped in the usual sterile fashion, and preoperative antibiotics were administered. A preoperative time-out was performed.   A 21 French rigid cystoscope was used to intubate the urethra.  Thorough cystoscopy was performed [place] the bladder was grossly normal, and ureteral orifices orthotopic bilaterally.  A 5 French access catheter advanced easily into the left ureteral orifice and was passed gently up to the mid ureter.  10 mL of ICG was instilled.  An identical  procedure was performed on the right side.   A 20 French two-way Foley passed easily into the bladder and 10 mL were placed in the balloon.  The case was returned to general surgery.  Disposition: To general surgery portion of case  Plan: Foley duration per primary team  Nickolas Madrid, MD

## 2022-05-24 NOTE — H&P (Signed)
UROLOGY H&P UPDATE  Agree with prior H&P by Dr. Hampton Abbot. Partial large bowel obstruction related to sigmoid stricture with plan for robotic assisted sigmoidectomy with possible end colostomy if unable to make a primary anastomosis, possible diverting loop ileostomy, possible open surgery.  Urology is requested to perform bilateral ureteral ICG with her history of pelvic surgery and radiation to aid in intraoperative identification.  Cardiac: RRR Lungs: CTA bilaterally  Laterality: bilateral Procedure: Cystoscopy, bilateral ureteral ICG instillation  Informed consent obtained, we specifically discussed the risks of bleeding, infection, post-operative pain, hematuria, AKI, ureteral injury, need for additional procedures including ureteral/bladder repair.  Billey Co, MD 05/24/2022

## 2022-05-24 NOTE — Anesthesia Postprocedure Evaluation (Signed)
Anesthesia Post Note  Patient: Elizabeth Martin  Procedure(s) Performed: XI ROBOTIC ASSISTED LOWER ANTERIOR RESECTION (Abdomen) CYSTOSCOPY WITH ICG INSTILLATION (Bilateral: Ureter)  Patient location during evaluation: PACU Anesthesia Type: General Level of consciousness: awake and alert Pain management: pain level controlled Vital Signs Assessment: post-procedure vital signs reviewed and stable Respiratory status: spontaneous breathing, nonlabored ventilation, respiratory function stable and patient connected to nasal cannula oxygen Cardiovascular status: blood pressure returned to baseline and stable Postop Assessment: no apparent nausea or vomiting Anesthetic complications: yes Comments: R corneal abrasion suspected and treated   No notable events documented.   Last Vitals:  Vitals:   05/24/22 2205 05/24/22 2210  BP: (!) 142/80   Pulse: 95 86  Resp: 13 16  Temp:  (!) 36.3 C  SpO2: 100% 99%    Last Pain:  Vitals:   05/24/22 2205  TempSrc:   PainSc: [place]

## 2022-05-25 ENCOUNTER — Encounter: Payer: Self-pay | Admitting: Surgery

## 2022-05-25 ENCOUNTER — Ambulatory Visit: Payer: BC Managed Care – PPO | Admitting: Occupational Therapy

## 2022-05-25 DIAGNOSIS — I10 Essential (primary) hypertension: Secondary | ICD-10-CM | POA: Diagnosis not present

## 2022-05-25 DIAGNOSIS — K56699 Other intestinal obstruction unspecified as to partial versus complete obstruction: Secondary | ICD-10-CM | POA: Diagnosis not present

## 2022-05-25 DIAGNOSIS — R1030 Lower abdominal pain, unspecified: Secondary | ICD-10-CM | POA: Diagnosis not present

## 2022-05-25 LAB — CBC
HCT: 36.6 % (ref 36.0–46.0)
Hemoglobin: 12 g/dL (ref 12.0–15.0)
MCH: 29.8 pg (ref 26.0–34.0)
MCHC: 32.8 g/dL (ref 30.0–36.0)
MCV: 90.8 fL (ref 80.0–100.0)
Platelets: 293 10*3/uL (ref 150–400)
RBC: 4.03 MIL/uL (ref 3.87–5.11)
RDW: 13.1 % (ref 11.5–15.5)
WBC: 11.9 10*3/uL — ABNORMAL HIGH (ref 4.0–10.5)
nRBC: 0 % (ref 0.0–0.2)

## 2022-05-25 LAB — BASIC METABOLIC PANEL
Anion gap: 4 — ABNORMAL LOW (ref 5–15)
BUN: 6 mg/dL — ABNORMAL LOW (ref 8–23)
CO2: 26 mmol/L (ref 22–32)
Calcium: 8.6 mg/dL — ABNORMAL LOW (ref 8.9–10.3)
Chloride: 109 mmol/L (ref 98–111)
Creatinine, Ser: 0.64 mg/dL (ref 0.44–1.00)
GFR, Estimated: >60 mL/min (ref >60)
Glucose, Bld: 120 mg/dL — ABNORMAL HIGH (ref 70–99)
Potassium: 4.1 mmol/L (ref 3.5–5.1)
Sodium: 139 mmol/L (ref 135–145)

## 2022-05-25 LAB — MAGNESIUM: Magnesium: 1.9 mg/dL (ref 1.7–2.4)

## 2022-05-25 MED ORDER — ALVIMOPAN 12 MG PO CAPS
12.0000 mg | ORAL_CAPSULE | Freq: Two times a day (BID) | ORAL | Status: AC
  Administered 2022-05-25 (×2): 12 mg via ORAL
  Filled 2022-05-25 (×3): qty 1

## 2022-05-25 MED ORDER — PREGABALIN 50 MG PO CAPS
100.0000 mg | ORAL_CAPSULE | Freq: Three times a day (TID) | ORAL | Status: DC
  Administered 2022-05-25 – 2022-05-27 (×6): 100 mg via ORAL
  Filled 2022-05-25 (×6): qty 2

## 2022-05-25 MED ORDER — KETOROLAC TROMETHAMINE 30 MG/ML IJ SOLN
30.0000 mg | Freq: Four times a day (QID) | INTRAMUSCULAR | Status: DC
  Administered 2022-05-25 – 2022-05-27 (×8): 30 mg via INTRAVENOUS
  Filled 2022-05-25 (×9): qty 1

## 2022-05-25 MED ORDER — ONDANSETRON 4 MG PO TBDP
4.0000 mg | ORAL_TABLET | Freq: Three times a day (TID) | ORAL | Status: DC | PRN
  Administered 2022-05-25 – 2022-05-26 (×2): 4 mg via ORAL
  Filled 2022-05-25 (×3): qty 1

## 2022-05-25 MED ORDER — HYDROMORPHONE HCL 1 MG/ML IJ SOLN: 0.5000 mg | INTRAMUSCULAR | Status: DC | PRN

## 2022-05-25 MED ORDER — OXYCODONE HCL 5 MG PO TABS
5.0000 mg | ORAL_TABLET | ORAL | Status: DC | PRN
  Administered 2022-05-25 – 2022-05-26 (×4): 5 mg via ORAL
  Filled 2022-05-25 (×5): qty 1

## 2022-05-25 MED ORDER — TRAMADOL HCL 50 MG PO TABS
50.0000 mg | ORAL_TABLET | Freq: Two times a day (BID) | ORAL | Status: DC
  Administered 2022-05-25 – 2022-05-27 (×5): 50 mg via ORAL
  Filled 2022-05-25 (×5): qty 1

## 2022-05-25 MED ORDER — ACETAMINOPHEN 500 MG PO TABS: 1000.0000 mg | ORAL_TABLET | Freq: Four times a day (QID) | ORAL | Status: DC | PRN

## 2022-05-25 MED ORDER — SODIUM CHLORIDE 0.9 % IV SOLN
2.0000 g | Freq: Three times a day (TID) | INTRAVENOUS | Status: AC
  Administered 2022-05-25 (×3): 2 g via INTRAVENOUS
  Filled 2022-05-25 (×3): qty 2

## 2022-05-25 NOTE — Progress Notes (Signed)
Nutrition Follow-up  DOCUMENTATION CODES:   Not applicable  INTERVENTION:   Boost Breeze po TID, each supplement provides 250 kcal and 9 grams of protein  Ensure Enlive po TID with diet advancement, each supplement provides 350 kcal and 20 grams of protein.  MVI po daily   Pt at high refeed risk; recommend monitor potassium, magnesium and phosphorus labs daily until stable  NUTRITION DIAGNOSIS:   Increased nutrient needs related to post-op healing as evidenced by estimated needs.  GOAL:   Patient will meet greater than or equal to 90% of their needs -not met   MONITOR:   PO intake, Supplement acceptance, Diet advancement  REASON FOR ASSESSMENT:   Consult Assessment of nutrition requirement/status  ASSESSMENT:   61 y/o female with h/o asthma, endometrial cancer s/p chemo/XRT 2013, DVT, hypertension. abdominal hernia s/p repair 2004 and autoimmune diease who is admitted with LBO seconadry to colon stricture.  -Pt s/p robotic assisted LAR with EEA and creation of diverting loop ileostomy 6/19  Pt with some incisional soreness and nausea today. Pt initiated on clear liquid diet today but has not eaten much. Pt refusing supplements r/t nausea. Pt does have some flatus and liquid stool via her ostomy. Recommend continue supplements and MVI. Pt is at high refeed risk. RD will add Ensure with diet advancement. Recommend TPN if unable to advance pt's diet in the next 48 hours. Per chart, pt is up ~10lbs since admission.   Medications reviewed and include: MVI, protonix, cefotan   Labs reviewed: K 4.1 wnl, BUN 6(L), Mg 1.9  Wbc- 11.9(H)  Diet Order:   Diet Order             Diet clear liquid Room service appropriate? Yes; Fluid consistency: Thin  Diet effective now                  EDUCATION NEEDS:   Education needs have been addressed  Skin:  Skin Assessment: Reviewed RN Assessment  Last BM:  6/19- type 7 via ostomy  Height:   Ht Readings from Last 1  Encounters:  05/18/22 5' 7"  (1.702 m)    Weight:   Wt Readings from Last 1 Encounters:  05/23/22 79.8 kg    Ideal Body Weight:  61.4 kg  BMI:  Body mass index is 27.55 kg/m.  Estimated Nutritional Needs:   Kcal:  1800-2100kcal/day  Protein:  90-105 grams/day   Fluid:  1.9-2.2L/day  Koleen Distance MS, RD, LDN Please refer to [facility] for RD and/or RD on-call/weekend/after hours pager

## 2022-05-25 NOTE — Evaluation (Signed)
Physical Therapy Evaluation Patient Details Name: Elizabeth Martin MRN: [id number] DOB: May 09, 1961 Today's Date: 05/25/2022  History of Present Illness  Elizabeth Martin is a 61 y.o. female with medical history significant of asthma, endometrial cancer, history of VTE's and hypertension coming in with abdominal pain and constipation.  Patient has a history of abdominal pain going on for few days getting worse along with nausea and vomiting. CT scan of abdomen/pelvis with contrast showed large bowel obstruction with a transition point at the sigmoid colon.   Clinical Impression  Pt admitted with above diagnosis. Pt received supine in bed agreeable to PT services. Reports pain as moderate at 4/10 NPS. Excited to attempt to mobilize. Pt reports being independent at baseline with all ADL's and mobility. Does endorse having full flight of stairs to have access to  full bathroom and bedroom.   To date, pt educated on log roll technique requiring education and min multimodal cuing throughout to complete with HOB slightly elevated. Pt did require minA at torso to stand but was very minimal assist. Between transfers pt required seated rest to recover due to abdominal pain. Bed elevated to mimic height of bed at home with ability to stand with minguard. Pt overall able to perform gait of ~60' with RW with step to pattern but with slow and consistent cadence with RW. Pt demonstrating excellent motivation to mobilize but encouraged from PT to not over do it. Pt verbalizing understanding returning to recliner. Education provided on possible need to sleep on main floor pending stair assessment with use of pillow for compression to ostomy site during cough, sneezing orbed mobility for comfort. Education provided on LE AROM exercises to assist in RLE edema management. Anticipate pt will be appropriate to d/c home with [facility] PT services. Will continue to follow per POC to trial stairs navigation to pt tolerance. Pt's needs within  reach, RN present. Pt currently with functional limitations due to the deficits listed below (see PT Problem List). Pt will benefit from skilled PT to increase their independence and safety with mobility to allow discharge to the venue listed below.       Recommendations for follow up therapy are one component of a multi-disciplinary discharge planning process, led by the attending physician.  Recommendations may be updated based on patient status, additional functional criteria and insurance authorization.  Follow Up Recommendations Home health PT    Assistance Recommended at Discharge Intermittent Supervision/Assistance  Patient can return home with the following  A little help with walking and/or transfers;Assistance with cooking/housework;A little help with bathing/dressing/bathroom;Assist for transportation;Help with stairs or ramp for entrance    Equipment Recommendations Rolling walker (2 wheels);BSC/3in1  Recommendations for Other Services       Functional Status Assessment Patient has had a recent decline in their functional status and demonstrates the ability to make significant improvements in function in a reasonable and predictable amount of time.     Precautions / Restrictions Precautions Precautions: Fall Restrictions Weight Bearing Restrictions: No Other Position/Activity Restrictions: Education on log roll for reduced abdominal pain with bed mobility      Mobility  Bed Mobility Overal bed mobility: Needs Assistance Bed Mobility: Supine to Sit     Supine to sit: Min assist, HOB elevated     General bed mobility comments: increased time, very minimal assist at torso to sit EOB Patient Response: Cooperative  Transfers Overall transfer level: Needs assistance Equipment used: Rolling walker (2 wheels) Transfers: Sit to/from Stand Sit to Stand: Supervision, From elevated  surface           General transfer comment: similar to bed height     Ambulation/Gait Ambulation/Gait assistance: Supervision Gait Distance (Feet): 60 Feet Assistive device: Rolling walker (2 wheels) Gait Pattern/deviations: Step-through pattern, Decreased step length - right, Decreased step length - left       General Gait Details: Increased time to perform due to pain.  Stairs            Wheelchair Mobility    Modified Rankin (Stroke Patients Only)       Balance Overall balance assessment: Needs assistance Sitting-balance support: Bilateral upper extremity supported, Feet unsupported Sitting balance-Leahy Scale: Fair     Standing balance support: Bilateral upper extremity supported, Reliant on assistive device for balance Standing balance-Leahy Scale: Fair                               Pertinent Vitals/Pain Pain Assessment Pain Assessment: 0-10 Pain Score: 7  Pain Location: abdominal pain Pain Descriptors / Indicators: Aching, Discomfort, Dull, Grimacing Pain Intervention(s): Limited activity within patient's tolerance, Monitored during session, Repositioned, Premedicated before session    [place] expects to be discharged to:: Private residence Living Arrangements: Spouse/significant other Available Help at Discharge: Family;Available PRN/intermittently (husband can't provide physical assist) Type of Home: House Home Access: Stairs to enter Entrance Stairs-Rails: None Entrance Stairs-Number of Steps: 1 (height of a brick) Alternate Level Stairs-Number of Steps: 17-19 Home Layout: Two level Home Equipment: None (1/2 bath on main level)      Prior Function Prior Level of Function : Independent/Modified Independent                     Hand Dominance        Extremity/Trunk Assessment   Upper Extremity Assessment Upper Extremity Assessment: Defer to OT evaluation    Lower Extremity Assessment Lower Extremity Assessment: Overall WFL for tasks assessed    Cervical / Trunk  Assessment Cervical / Trunk Assessment: Normal  Communication   Communication: No difficulties  Cognition Arousal/Alertness: Awake/alert Behavior During Therapy: WFL for tasks assessed/performed Overall Cognitive Status: Within Functional Limits for tasks assessed                                          General Comments      Exercises General Exercises - Lower Extremity Ankle Circles/Pumps: AROM, Strengthening, 10 reps, Seated Long Arc Quad: AROM, Strengthening, Seated, 5 reps Other Exercises Other Exercises: Role of PT in acute setting, d/c recs, log roll techniques, needed DME   Assessment/Plan    PT Assessment Patient needs continued PT services  PT Problem List Decreased strength;Decreased activity tolerance;Pain       PT Treatment Interventions DME instruction;Therapeutic exercise;Gait training;Balance training;Stair training;Neuromuscular re-education;Functional mobility training;Therapeutic activities;Patient/family education    PT Goals (Current goals can be found in the Care Plan section)  Acute Rehab PT Goals Patient Stated Goal: improve pain and mobility PT Goal Formulation: With patient Time For Goal Achievement: 06/08/22 Potential to Achieve Goals: Good    Frequency Min 2X/week     Co-evaluation               AM-PAC PT "6 Clicks" Mobility  Outcome Measure Help needed turning from your back to your side while in a flat bed without using bedrails?: A  Little Help needed moving from lying on your back to sitting on the side of a flat bed without using bedrails?: A Little Help needed moving to and from a bed to a chair (including a wheelchair)?: A Little Help needed standing up from a chair using your arms (e.g., wheelchair or bedside chair)?: A Little Help needed to walk in hospital room?: A Lot Help needed climbing 3-5 steps with a railing? : A Lot 6 Click Score: 16    End of Session Equipment Utilized During Treatment: Gait  belt Activity Tolerance: Patient tolerated treatment well Patient left: in chair;with call bell/phone within reach;with nursing/sitter in room Nurse Communication: Mobility status PT Visit Diagnosis: Other abnormalities of gait and mobility (R26.89);Muscle weakness (generalized) (M62.81);Pain Pain - part of body:  (abdomen)    Time: 1751-0258 PT Time Calculation (min) (ACUTE ONLY): 47 min   Charges:   PT Evaluation $PT Eval Moderate Complexity: 1 Mod PT Treatments $Gait Training: 23-37 mins      Cortez Flippen M. Fairly IV, PT, DPT Physical Therapist- [facility]  05/25/2022, 2:18 PM

## 2022-05-25 NOTE — Progress Notes (Signed)
[place] Hospital Day(s): 7.   Post op day(s): 1 Day Post-Op.   Interval History:  Patient seen and examined No acute events or new complaints overnight.  Patient reports she is doing reasonably well; incisional soreness No fever, chills, nausea, emesis She has a slight leukocytosis; WBC 11.9K Hgb stable at 12.0 Renal function normal; sCr- 0.64; UO - 1300 ccs No significant electrolyte derangements  She is on CLD; has not eaten yet She is having gas and liquid stool from ileostomy   Vital signs in last 24 hours: [min-max] current  Temp:  [97.3 F (36.3 C)-98.1 F (36.7 C)] 98 F (36.7 C) (06/20 0416) Pulse Rate:  [82-105] 86 (06/20 0416) Resp:  [13-21] 16 (06/20 0416) BP: (117-160)/(58-92) 120/58 (06/20 0416) SpO2:  [95 %-100 %] 100 % (06/20 0416)     Height: '5\' 7"'$  (170.2 cm) Weight: 79.8 kg BMI (Calculated): 27.55   Intake/Output last 2 shifts:  06/19 0701 - 06/20 0700 In: 2200 [I.V.:1500; IV Piggyback:200] Out: 1400 [Urine:1300; Blood:100]   Physical Exam:  Constitutional: alert, cooperative and no distress  Respiratory: breathing non-labored at rest  Cardiovascular: regular rate and sinus rhythm  Gastrointestinal: Soft, expected incisional soreness; non-distended, no rebound/guarding. Loop ileostomy to the 10 o'clock position lateral to the umbilicus, pink, patent, red rubber catheter bridge in place. There is gas and liquid stool in bag. Integumentary: Laparoscopic incisions are CDI with staple and honeycomb; no erythema, no drainage   Labs:     Latest Ref Rng & Units 05/25/2022    3:46 AM 05/24/2022    4:31 AM 05/19/2022    3:24 AM  CBC  WBC 4.0 - 10.5 K/uL 11.9  5.5  9.6   Hemoglobin 12.0 - 15.0 g/dL 12.0  11.3  12.5   Hematocrit 36.0 - 46.0 % 36.6  34.5  37.3   Platelets 150 - 400 K/uL 293  301  309       Latest Ref Rng & Units 05/25/2022    3:46 AM 05/24/2022    4:31 AM 05/23/2022    6:01 AM  CMP  Glucose 70 - 99  mg/dL 120  97  97   BUN 8 - 23 mg/dL 6  <5  <5   Creatinine 0.44 - 1.00 mg/dL 0.64  0.47  0.50   Sodium 135 - 145 mmol/L 139  140  140   Potassium 3.5 - 5.1 mmol/L 4.1  4.0  3.8   Chloride 98 - 111 mmol/L 109  112  110   CO2 22 - 32 mmol/L '26  22  24   '$ Calcium 8.9 - 10.3 mg/dL 8.6  9.1  9.4     Imaging studies: No new pertinent imaging studies   Assessment/Plan:  61 y.o. 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   - Will continue CLD. If she does well and continues to have good ileostomy function today, we may be able to do FLD tonight  - Monitor ileostomy function; record output. Anticipate this being very loose/liquid. If output >1.5-2L, she may need imodium. Would not start this yet.   - Discontinue foley catheter  - Continue entereg; will set stop date for tomorrow (06/21) morning given ileostomy function  - Continue IVF resuscitation; wean as diet advances  - WOC for ostomy teaching  - Monitor abdominal examination   -  Monitor leukocytosis; likely reactive    - Mobilization as tolerated; low threshold to engage PT if needed  - Further management per primary service; we will of course follow   All of the above findings and recommendations were discussed with the patient, patient's family (husband at bedside), and the medical team, and all of patient's and family's questions were answered to their expressed satisfaction.  -- Edison Simon, PA-C [facility] 05/25/2022, 7:40 AM M-F: 7am - 4pm

## 2022-05-25 NOTE — Progress Notes (Signed)
  Progress Note   Patient: Elizabeth Martin IZT:[id number] DOB: 1961-10-13 DOA: 05/18/2022     7 DOS: the patient was seen and examined on 05/25/2022   Brief hospital course: Taquila Leys is a 61 y.o. female with medical history significant of asthma, endometrial cancer, history of VTE's and hypertension coming in with abdominal pain and constipation.  Patient has a history of abdominal pain going on for few days getting worse along with nausea and vomiting.  CT scan of abdomen/pelvis with contrast showed large bowel obstruction with a transition point at the sigmoid colon.  Patient has been seen by general surgery, suspect diverticulosis with stricture.  Patient had a partial sigmoidoscopy 6/14.  Showed sigmoid diverticulosis without diverticulitis.  Could not advance further.  However, patient was given multiple stool softener and a laxative, she started to have large amount of bowel movements.  Symptom has resolved since 6/15. Patient had a ileostomy on 6/19  Assessment and Plan: Large bowel obstruction likely secondary to colon stricture. Nausea and vomiting secondary to bowel obstruction. Patient is status post ileostomy, postoperative, patient doing well.  Already tolerating liquid diet with some ileostomy output.  Continue symptomatic treatment.   Hypokalemia. Improved  Essential hypertension. Amlodipine.   Right leg swelling. Repeated echocardiogram did not show a DVT.        Subjective:  Patient has some nausea, no vomiting.  Has about 100 mL of ileostomy output this morning.  No short of breath  Physical Exam: Vitals:   05/25/22 0009 05/25/22 0307 05/25/22 0416 05/25/22 0800  BP: (!) 146/79 137/75 (!) 120/58 121/61  Pulse: 85 94 86 85  Resp: '18 16 16 16  '$ Temp: (!) 97.5 F (36.4 C) 97.6 F (36.4 C) 98 F (36.7 C) 98.2 F (36.8 C)  TempSrc: Axillary Oral    SpO2: 100% 99% 100% 96%  Weight:      Height:       General exam: Appears calm and comfortable  Respiratory  system: Clear to auscultation. Respiratory effort normal. Cardiovascular system: S1 & S2 heard, RRR. No JVD, murmurs, rubs, gallops or clicks. No pedal edema. Gastrointestinal system: Abdomen is nondistended, soft and nontender. No organomegaly or masses felt. Normal bowel sounds heard. Central nervous system: Alert and oriented. No focal neurological deficits. Extremities: Symmetric 5 x 5 power. Skin: No rashes, lesions or ulcers Psychiatry: Judgement and insight appear normal. Mood & affect appropriate.   Data Reviewed:  Reviewed surgical note, all lab results.  Family Communication: Husband at the bedside.  Disposition: Status is: Inpatient Remains inpatient appropriate because: Severity of disease, status post surgery  Planned Discharge Destination: Home    Time spent: 35 minutes  Author: Sharen Hones, MD 05/25/2022 12:26 PM  For on call review [email or web address].

## 2022-05-25 NOTE — Consult Note (Signed)
[place] Nurse ostomy follow up Patient receiving care in [facility]. Spouse in room. Darci Needle PA-C present also. Stoma type/location: LMQ loop ileostomy Stomal assessment/size: 1 3/8 inches round, red, moist, as measured and assessed through the pouch. Peristomal assessment: deferred. The PA requested to leave the surgical honeycomb dressings in place until tomorrow.  The pouching system is over the dressings and attempting to remove the pouching system will disrupt the dressings. Treatment options for stomal/peristomal skin: barrier ring Output: 120m thin green effluent emptied from pouch. Patient and her spouse observed the process and understood the necessity to empty the pouch when 1/3 full and to release the gas.  These actions will assist with preventing leakage. Ostomy pouching: 2pc. 2 and 1/4 inch system has been placed in her room. Patient uses Pouch LKellie Simmering#234, skin barrier LKellie Simmering#[phone] barrier rings LKellie Simmering#[phone] provided: how to open and empty. [place] today.  SVal Riles RN, MSN, CWOCN, CNS-BC, pager [phone]

## 2022-05-26 DIAGNOSIS — K56609 Unspecified intestinal obstruction, unspecified as to partial versus complete obstruction: Secondary | ICD-10-CM | POA: Diagnosis not present

## 2022-05-26 DIAGNOSIS — R1030 Lower abdominal pain, unspecified: Secondary | ICD-10-CM | POA: Diagnosis not present

## 2022-05-26 DIAGNOSIS — K56699 Other intestinal obstruction unspecified as to partial versus complete obstruction: Secondary | ICD-10-CM | POA: Diagnosis not present

## 2022-05-26 LAB — CBC
HCT: 33.6 % — ABNORMAL LOW (ref 36.0–46.0)
Hemoglobin: 10.8 g/dL — ABNORMAL LOW (ref 12.0–15.0)
MCH: 30 pg (ref 26.0–34.0)
MCHC: 32.1 g/dL (ref 30.0–36.0)
MCV: 93.3 fL (ref 80.0–100.0)
Platelets: 251 10*3/uL (ref 150–400)
RBC: 3.6 MIL/uL — ABNORMAL LOW (ref 3.87–5.11)
RDW: 13.6 % (ref 11.5–15.5)
WBC: 7 10*3/uL (ref 4.0–10.5)
nRBC: 0 % (ref 0.0–0.2)

## 2022-05-26 LAB — PHOSPHORUS: Phosphorus: 4.9 mg/dL — ABNORMAL HIGH (ref 2.5–4.6)

## 2022-05-26 LAB — BASIC METABOLIC PANEL
Anion gap: 3 — ABNORMAL LOW (ref 5–15)
BUN: 6 mg/dL — ABNORMAL LOW (ref 8–23)
CO2: 27 mmol/L (ref 22–32)
Calcium: 8.6 mg/dL — ABNORMAL LOW (ref 8.9–10.3)
Chloride: 109 mmol/L (ref 98–111)
Creatinine, Ser: 0.65 mg/dL (ref 0.44–1.00)
GFR, Estimated: >60 mL/min (ref >60)
Glucose, Bld: 90 mg/dL (ref 70–99)
Potassium: 3.5 mmol/L (ref 3.5–5.1)
Sodium: 139 mmol/L (ref 135–145)

## 2022-05-26 LAB — SURGICAL PATHOLOGY

## 2022-05-26 LAB — MAGNESIUM: Magnesium: 1.9 mg/dL (ref 1.7–2.4)

## 2022-05-26 MED ORDER — ENSURE ENLIVE PO LIQD
237.0000 mL | Freq: Three times a day (TID) | ORAL | Status: DC
  Administered 2022-05-26 – 2022-05-27 (×4): 237 mL via ORAL

## 2022-05-26 MED ORDER — PNEUMOCOCCAL 20-VAL CONJ VACC 0.5 ML IM SUSY: 0.5000 mL | PREFILLED_SYRINGE | INTRAMUSCULAR | Status: DC

## 2022-05-26 MED ORDER — POTASSIUM CHLORIDE 20 MEQ PO PACK
40.0000 meq | PACK | Freq: Once | ORAL | Status: AC
  Administered 2022-05-26: 40 meq via ORAL
  Filled 2022-05-26: qty 2

## 2022-05-26 MED ORDER — PNEUMOCOCCAL 20-VAL CONJ VACC 0.5 ML IM SUSY
0.5000 mL | PREFILLED_SYRINGE | INTRAMUSCULAR | Status: DC
  Filled 2022-05-26 (×2): qty 0.5

## 2022-05-26 NOTE — Progress Notes (Signed)
PT Cancellation Note  Patient Details Name: Elizabeth Martin MRN: [id number] DOB: [birth year]   Cancelled Treatment:     PT attempt. Pt's spouse at bedside requesting to let pt rest. Acute PT will continue to follow and progress as able per current POC. Will return at a later date/time when pt is more appropriate to participate.    Willette Pa 05/26/2022, 2:20 PM

## 2022-05-26 NOTE — Progress Notes (Addendum)
Elizabeth Martin Hospital Day(s): 8.   Post op day(s): 2 Days Post-Op.   Interval History:  Patient seen and examined No acute events or new complaints overnight.  Patient reports abdominal pain is improving No fever, chills, nausea, emesis She is without leukocytosis; WBC 7.0K Hgb stable at 10.8 Renal function normal; sCr- 0.65; UO - 900 ccs + unmeasured Mild hyperphosphatemia to 4.9 She is on CLD; tolerating well She is having gas and liquid stool from ileostomy; 200 ccs recorded  Vital signs in last 24 hours: [min-max] current  Temp:  [97.8 F (36.6 C)-98.2 F (36.8 C)] 98 F (36.7 C) (06/21 0423) Pulse Rate:  [81-87] 87 (06/21 0423) Resp:  [16-18] 18 (06/21 0423) BP: (105-121)/(57-64) 105/64 (06/21 0423) SpO2:  [92 %-99 %] 99 % (06/21 0423) Weight:  [77.4 kg] 77.4 kg (06/21 0423)     Height: '5\' 7"'$  (170.2 cm) Weight: 77.4 kg BMI (Calculated): 26.72   Intake/Output last 2 shifts:  06/20 0701 - 06/21 0700 In: 157.2 [P.O.:80; IV Piggyback:77.2] Out: 1105 [Urine:900; Stool:205]   Physical Exam:  Constitutional: alert, cooperative and no distress  Respiratory: breathing non-labored at rest  Cardiovascular: regular rate and sinus rhythm  Gastrointestinal: Soft, expected incisional soreness; non-distended, no rebound/guarding. Loop ileostomy to the 10 o'clock position lateral to the umbilicus, pink, patent, red rubber catheter bridge in place. There is gas and liquid stool in bag. Integumentary: Laparoscopic incisions are CDI with staple and honeycomb; no erythema, no drainage   Labs:     Latest Ref Rng & Units 05/26/2022    4:15 AM 05/25/2022    3:46 AM 05/24/2022    4:31 AM  CBC  WBC 4.0 - 10.5 K/uL 7.0  11.9  5.5   Hemoglobin 12.0 - 15.0 g/dL 10.8  12.0  11.3   Hematocrit 36.0 - 46.0 % 33.6  36.6  34.5   Platelets 150 - 400 K/uL 251  293  301       Latest Ref Rng & Units 05/26/2022    4:15 AM 05/25/2022    3:46 AM 05/24/2022     4:31 AM  CMP  Glucose 70 - 99 mg/dL 90  120  97   BUN 8 - 23 mg/dL 6  6  <5   Creatinine 0.44 - 1.00 mg/dL 0.65  0.64  0.47   Sodium 135 - 145 mmol/L 139  139  140   Potassium 3.5 - 5.1 mmol/L 3.5  4.1  4.0   Chloride 98 - 111 mmol/L 109  109  112   CO2 22 - 32 mmol/L '27  26  22   '$ Calcium 8.9 - 10.3 mg/dL 8.6  8.6  9.1     Imaging studies: No new pertinent imaging studies   Assessment/Plan:  61 y.o. 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   - Advance to FLD  - Monitor ileostomy function; record output. Anticipate this being very loose/liquid. If output >1.5-2L, she may need imodium. Would not start this yet.   - Discontinue entereg today given persistent bowel function   - WOC for ostomy teaching  - Monitor abdominal examination   - Monitor leukocytosis; likely reactive    - Mobilization as tolerated; low threshold to engage PT if needed  - Further management per primary service; we will of course follow  - Discharge  Planning; Advancing diet, doing well with ileostomy function. Anticipate ready for DC in 24-48 hours   All of the above findings and recommendations were discussed with the patient, patient's family (husband at bedside), and the medical team, and all of patient's and family's questions were answered to their expressed satisfaction.  -- Edison Simon, PA-C [facility] 05/26/2022, 7:22 AM M-F: 7am - 4pm

## 2022-05-26 NOTE — Progress Notes (Signed)
  Progress Note   Patient: Elizabeth Martin AUQ:[id number] DOB: 1961-06-09 DOA: 05/18/2022     8 DOS: the patient was seen and examined on 05/26/2022   Brief hospital course: Mikel Pyon is a 61 y.o. female with medical history significant of asthma, endometrial cancer, history of VTE's and hypertension coming in with abdominal pain and constipation.  Patient has a history of abdominal pain going on for few days getting worse along with nausea and vomiting.  CT scan of abdomen/pelvis with contrast showed large bowel obstruction with a transition point at the sigmoid colon.  Patient has been seen by general surgery, suspect diverticulosis with stricture.  Patient had a partial sigmoidoscopy 6/14.  Showed sigmoid diverticulosis without diverticulitis.  Could not advance further.  However, patient was given multiple stool softener and a laxative, she started to have large amount of bowel movements.  Symptom has resolved since 6/15. Patient had  ileostomy on 6/19  Assessment and Plan: Large bowel obstruction likely secondary to colon stricture. Nausea and vomiting secondary to bowel obstruction. Patient is status post ileostomy Patient has good ileostomy output.  Diet advanced to soft.  General surgery following, recommended discharge in 24 to 48 hours.   Hypokalemia. Potassium 3.5 today, keep 40 mEq oral potassium.  Recheck a BMP tomorrow  Essential hypertension. Amlodipine.   Right leg swelling. Repeated ultrasound did not show a DVT      Subjective:  Patient has some sleepiness today after giving pain medicine.  Otherwise no complaints  Physical Exam: Vitals:   05/25/22 0800 05/25/22 1955 05/26/22 0423 05/26/22 0755  BP: 121/61 (!) 106/57 105/64 117/62  Pulse: 85 81 87 77  Resp: '16 16 18 16  '$ Temp: 98.2 F (36.8 C) 97.8 F (36.6 C) 98 F (36.7 C) 97.9 F (36.6 C)  TempSrc:    Oral  SpO2: 96% 92% 99% 98%  Weight:   77.4 kg   Height:       General exam: Appears calm and  comfortable  Respiratory system: Clear to auscultation. Respiratory effort normal. Cardiovascular system: S1 & S2 heard, RRR. No JVD, murmurs, rubs, gallops or clicks. No pedal edema. Gastrointestinal system: Abdomen is nondistended, soft and nontender. No organomegaly or masses felt. Normal bowel sounds heard. Central nervous system: Alert and oriented. No focal neurological deficits. Extremities: Symmetric 5 x 5 power. Skin: No rashes, lesions or ulcers Psychiatry: Judgement and insight appear normal. Mood & affect appropriate.   Data Reviewed:  Lab results reviewed  Family Communication: Husband updated at bedside.  Disposition: Status is: Inpatient Remains inpatient appropriate because: Severity of disease, postop  Planned Discharge Destination: Home with Home Health    Time spent: 35 minutes  Author: Sharen Hones, MD 05/26/2022 3:06 PM  For on call review [email or web address].

## 2022-05-26 NOTE — Therapy (Cosign Needed)
Patient requires a 3 n 1 due to impaired mobility, and inability to navigate to nearest restroom in home.

## 2022-05-26 NOTE — TOC Progression Note (Signed)
Transition of Care [facility]) - Progression Note    Patient Details  Name: Elizabeth Martin MRN: [id number] Date of Birth: 1961-07-23  Transition of Care [facility]) CM/SW [place], RN Phone Number: 05/26/2022, 10:35 AM  Clinical Narrative:   Patient requires home health for PT and Nursing at this time.  Spoke to patient at bedside, Alvis Lemmings to accept patient for both.  Rolling walker and 3 n 1 ordered and [employer] notified to deliver to room.  RNCM spoke to patient an spouse, they do not feel they have other DC needs from [facility] at this time.    Expected Discharge Plan: Home/Self Care Barriers to Discharge: Continued Medical Work up  Expected Discharge Plan and Services Expected Discharge Plan: Home/Self Care       Living arrangements for the past 2 months: Single Family Home                                       Social Determinants of Health (SDOH) Interventions    Readmission Risk Interventions     No data to display

## 2022-05-26 NOTE — Consult Note (Addendum)
[facility] Nurse ostomy consult note Stoma type/location:  RMQ ileostomy with red rubber retention rod in place.  Spouse at bedside and patient will perform pouch change with my assistance today. Per their request, I am recommending a referral to the outpatient ostomy clinic located on the [place].  Stomal assessment/size:  1 1/4"  cut to 1 3/8" to accommodate retention. Only slightly budded with rod in place.  Peristomal assessment: intact  honeycomb dressings superior and distal to stoma.  Some overlapping is unavoidable.  Treatment options for stomal/peristomal skin: Barrier ring and switching to 1 piece convex pouch.  Discussed rationale and likelihood of liquid effluent that may be high volume  Output liquid green effluent in pouch.  Ostomy pouching: 1pc.convex with barrier ring  Education provided: discussed that rod will be removed at surgeon discretion after allowing stoma to mature.  Covered twice weekly pouch changes and emptying when 1/3 full.  Discussed showering, outpatient clinic and risk of dehydration and blockage and importance to sip liquids, monitor for change in output.  Demonstrated use of powder and skin prep and provided this in case skin becomes irritated once home.  Will have [facility] services, they inform me.  Patient was able to cut barrier opening to fit apply pouch with minimal assistance and roll closed.  She has been emptying independently.   Enrolled patient in [facility] program: Yes previously per my partner's note.  Will follow.  Ordered 3 additional pouch sets for discharge. May go home tomorrow.   Domenic Moras MSN, RN, FNP-BC CWON Wound, Ostomy, Continence Nurse Pager [phone]

## 2022-05-27 ENCOUNTER — Encounter: Payer: BC Managed Care – PPO | Admitting: Occupational Therapy

## 2022-05-27 DIAGNOSIS — R1030 Lower abdominal pain, unspecified: Secondary | ICD-10-CM | POA: Diagnosis not present

## 2022-05-27 LAB — CBC
HCT: 30.3 % — ABNORMAL LOW (ref 36.0–46.0)
Hemoglobin: 9.8 g/dL — ABNORMAL LOW (ref 12.0–15.0)
MCH: 30.3 pg (ref 26.0–34.0)
MCHC: 32.3 g/dL (ref 30.0–36.0)
MCV: 93.8 fL (ref 80.0–100.0)
Platelets: 230 10*3/uL (ref 150–400)
RBC: 3.23 MIL/uL — ABNORMAL LOW (ref 3.87–5.11)
RDW: 13.2 % (ref 11.5–15.5)
WBC: 7.1 10*3/uL (ref 4.0–10.5)
nRBC: 0 % (ref 0.0–0.2)

## 2022-05-27 LAB — BASIC METABOLIC PANEL
Anion gap: 5 (ref 5–15)
BUN: 7 mg/dL — ABNORMAL LOW (ref 8–23)
CO2: 23 mmol/L (ref 22–32)
Calcium: 8.6 mg/dL — ABNORMAL LOW (ref 8.9–10.3)
Chloride: 107 mmol/L (ref 98–111)
Creatinine, Ser: 0.55 mg/dL (ref 0.44–1.00)
GFR, Estimated: >60 mL/min (ref >60)
Glucose, Bld: 107 mg/dL — ABNORMAL HIGH (ref 70–99)
Potassium: 4 mmol/L (ref 3.5–5.1)
Sodium: 135 mmol/L (ref 135–145)

## 2022-05-27 LAB — MAGNESIUM: Magnesium: 2 mg/dL (ref 1.7–2.4)

## 2022-05-27 LAB — PHOSPHORUS: Phosphorus: 3.7 mg/dL (ref 2.5–4.6)

## 2022-05-27 MED ORDER — IBUPROFEN 600 MG PO TABS: 600.0000 mg | ORAL_TABLET | Freq: Four times a day (QID) | ORAL | 0 refills | Status: DC | PRN

## 2022-05-27 MED ORDER — PROCHLORPERAZINE MALEATE 5 MG PO TABS: 5.0000 mg | ORAL_TABLET | Freq: Four times a day (QID) | ORAL | 0 refills | Status: DC | PRN

## 2022-05-27 MED ORDER — OXYCODONE HCL 5 MG PO TABS: 5.0000 mg | ORAL_TABLET | Freq: Four times a day (QID) | ORAL | 0 refills | Status: DC | PRN

## 2022-05-27 NOTE — Discharge Instructions (Addendum)
In addition to included general post-operative instructions,  Diet: Continue soft diet for a week and can advance to your regular diet afterwards.  Activity: No heavy lifting >20 pounds (children, pets, laundry, garbage) for 6 weeks, but light activity and walking are encouraged. Do not drive or drink alcohol if taking narcotic pain medications or having pain that might distract from driving.  Wound care: You may shower/get incision wet with soapy water and pat dry (do not rub incisions), but no baths or submerging incision underwater until follow-up.   Ostomy output:  Goal would be to have about 1 L of ostomy output per day.  If you're reaching more than 1.5 L of output per day, you should start taking Benefiber or Metamucil once daily to try bulk up the stool.  If this alone is not helping, then start taking Imodium and slowly increase as needed to control the output, while avoiding constipation as well.  Medications: May restart hydroxychloroquine (Plaquenil) and Azathioprine (Imuran) on Monday 06/26.   Otherwise, you can resume all other home medications immediately. For mild to moderate pain: acetaminophen (Tylenol) or ibuprofen/naproxen (if no kidney disease). Combining Tylenol with alcohol can substantially increase your risk of causing liver disease. Narcotic pain medications, if prescribed, can be used for severe pain, though may cause nausea, constipation, and drowsiness. Do not combine Tylenol and Percocet (or similar) within a 6 hour period as Percocet (and similar) contain(s) Tylenol. If you do not need the narcotic pain medication, you do not need to fill the prescription.  Call office [phone] / [phone]) at any time if any questions, worsening pain, fevers/chills, bleeding, drainage from incision site, or other concerns.

## 2022-05-27 NOTE — Discharge Summary (Signed)
Discharge Summary  Elizabeth Martin WEX:[id number] DOB: 01-29-1961  PCP: Dianne Dun, MD  Admit date: 05/18/2022 Discharge date: 05/27/2022  Time spent: 25 minutes.  Recommendations for Outpatient Follow-up:  Follow-up with general surgery  Discharge Diagnoses:  Active Hospital Problems   Diagnosis Date Noted   Abdominal pain 05/18/2022    Priority: 1.   Colonic stricture (HCC) 05/19/2022   Large bowel obstruction ([facility]) 05/19/2022   Hypokalemia 05/18/2022   Gastroesophageal reflux disease 02/15/2020   Essential hypertension 08/09/2017   Asthma 06/04/1969    Resolved Hospital Problems  No resolved problems to display.    Discharge Condition: Stable  Diet recommendation: Resume previous diet  Vitals:   05/27/22 0344 05/27/22 0746  BP: 104/61 123/81  Pulse: 75 79  Resp:  20  Temp: 97.6 F (36.4 C) 97.6 F (36.4 C)  SpO2: 96% 100%    History of present illness:   Elizabeth Martin is a 61 y.o. female with medical history significant of asthma, endometrial cancer, history of VTE's and hypertension coming in with abdominal pain and constipation.  Patient has a history of abdominal pain going on for few days getting worse along with nausea and vomiting.  CT scan of abdomen/pelvis with contrast showed large bowel obstruction with a transition point at the sigmoid colon.  Patient has been seen by general surgery, suspect diverticulosis with stricture.  Patient had a partial sigmoidoscopy 6/14.  Showed sigmoid diverticulosis without diverticulitis.  Could not advance further.  However, patient was given multiple stool softener and a laxative, she started to have large amount of bowel movements.  Symptom has resolved since 6/15. Patient had  ileostomy on 6/19.  05/27/2022: Seen at bedside, no new complaints.  Hospital Course:  Principal Problem:   Abdominal pain Active Problems:   Hypokalemia   Asthma   Gastroesophageal reflux disease   Essential hypertension   Colonic  stricture (HCC)   Large bowel obstruction (HCC)  Large bowel obstruction likely secondary to colon stricture, status post ileostomy. Nausea and vomiting secondary to bowel obstruction. Tolerated a diet well. Okay to discharge home per general surgery.   Resolved post repletion.  Hypokalemia. Potassium 3.5 today, keep 40 mEq oral potassium.  Recheck a BMP tomorrow  Essential hypertension. Stable Continue amlodipine.   Right leg swelling. Repeated ultrasound did not show a DVT     Procedures: Status post ileostomy  Consultations: General surgery.  Discharge Exam: BP 123/81 (BP Location: Left Arm)   Pulse 79   Temp 97.6 F (36.4 C) (Oral)   Resp 20   Ht '5\' 7"'$  (1.702 m)   Wt 80.6 kg   SpO2 100%   BMI 27.83 kg/m  General: 61 y.o. year-old female well developed well nourished in no acute distress.  Alert and oriented x3. Cardiovascular: Regular rate and rhythm with no rubs or gallops.  No thyromegaly or JVD noted.   Respiratory: Clear to auscultation with no wheezes or rales. Good inspiratory effort. Psychiatry: Mood is appropriate for condition and setting  Discharge Instructions You were cared for by a hospitalist during your hospital stay. If you have any questions about your discharge medications or the care you received while you were in the hospital after you are discharged, you can call the unit and asked to speak with the hospitalist on call if the hospitalist that took care of you is not available. Once you are discharged, your primary care physician will handle any further medical issues. Please note that NO REFILLS for any discharge medications  will be authorized once you are discharged, as it is imperative that you return to your primary care physician (or establish a relationship with a primary care physician if you do not have one) for your aftercare needs so that they can reassess your need for medications and monitor your lab values.   Allergies as of 05/27/2022        Reactions   Carboplatin Anaphylaxis   ANAPHYLACTIC REACTION ANAPHYLACTIC REACTION   Nitrofurantoin Nausea Only, Nausea And Vomiting   Silicone Itching, Rash   Wheat Bran Other (See Comments)   Other reaction(s): Other (See Comments) Body aches and swelling Body aches and swelling   Fentanyl Hives, Itching, Rash        Medication List     TAKE these medications    albuterol 108 (90 Base) MCG/ACT inhaler Commonly known as: VENTOLIN HFA Inhale 2 puffs into the lungs every 6 (six) hours as needed.   azaTHIOprine 50 MG tablet Commonly known as: IMURAN Take 50 mg by mouth daily.   celecoxib 100 MG capsule Commonly known as: CELEBREX Take 100 mg by mouth 2 (two) times daily.   cetirizine 10 MG tablet Commonly known as: ZYRTEC Take 10 mg by mouth daily.   chlorthalidone 50 MG tablet Commonly known as: HYGROTON Take 50 mg by mouth every morning.   clindamycin 2 % vaginal cream Commonly known as: CLEOCIN LKTGYBWL:8.9-3 Applicator Vaginal Every Other Day   clopidogrel 75 MG tablet Commonly known as: Plavix Take 1 tablet (75 mg total) by mouth daily.   docusate sodium 100 MG capsule Commonly known as: COLACE Take 100 mg by mouth 2 (two) times daily.   DULoxetine 30 MG capsule Commonly known as: CYMBALTA Take 90 mg by mouth daily.   dutasteride 0.5 MG capsule Commonly known as: AVODART Take 0.5 mg by mouth daily.   gabapentin 100 MG capsule Commonly known as: NEURONTIN Take 100 mg by mouth at bedtime.   hydroxychloroquine 200 MG tablet Commonly known as: PLAQUENIL Take 200 mg by mouth 2 (two) times daily.   ibuprofen 600 MG tablet Commonly known as: ADVIL Take 1 tablet (600 mg total) by mouth every 6 (six) hours as needed.   lisinopril 20 MG tablet Commonly known as: ZESTRIL Take 1 tablet by mouth daily.   metroNIDAZOLE 500 MG tablet Commonly known as: FLAGYL Take by mouth.   multivitamin capsule Take 1 capsule by mouth daily.   oxybutynin  5 MG tablet Commonly known as: DITROPAN TAKE 1 TABLET BY MOUTH THREE TIMES A DAY   oxyCODONE 5 MG immediate release tablet Commonly known as: Oxy IR/ROXICODONE Take 1 tablet (5 mg total) by mouth every 6 (six) hours as needed for severe pain or breakthrough pain. What changed:  when to take this reasons to take this   pentoxifylline 400 MG CR tablet Commonly known as: TRENTAL Take by mouth.   prochlorperazine 5 MG tablet Commonly known as: COMPAZINE Take 1 tablet (5 mg total) by mouth every 6 (six) hours as needed for nausea or vomiting.               Durable Medical Equipment  (From admission, onward)           Start     Ordered   05/26/22 1033  For home use only DME 3 n 1  Once        05/26/22 1033   05/26/22 1033  For home use only DME Walker rolling  Once  Question Answer Comment  Walker: With 5 Inch Wheels   Patient needs a walker to treat with the following condition Impaired ambulation      05/26/22 1033           Allergies  Allergen Reactions   Carboplatin Anaphylaxis    ANAPHYLACTIC REACTION ANAPHYLACTIC REACTION    Nitrofurantoin Nausea Only and Nausea And Vomiting   Silicone Itching and Rash   Wheat Bran Other (See Comments)    Other reaction(s): Other (See Comments) Body aches and swelling Body aches and swelling    Fentanyl Hives, Itching and Rash    Follow-up Information     Olean Ree, MD. Go on 06/04/2022.   Specialty: General Surgery Why: Appointment @ 10:30 am. Contact information: [address] [place] [address] [phone]                  The results of significant diagnostics from this hospitalization (including imaging, microbiology, ancillary and laboratory) are listed below for reference.    Significant Diagnostic Studies: US Venous Img Lower Unilateral Right (DVT)  Result Date: 05/24/2022 CLINICAL DATA:  61 year old female with a history of swelling EXAM: RIGHT LOWER  EXTREMITY VENOUS DOPPLER ULTRASOUND TECHNIQUE: Gray-scale sonography with graded compression, as well as color Doppler and duplex ultrasound were performed to evaluate the lower extremity deep venous systems from the level of the common femoral vein and including the common femoral, femoral, profunda femoral, popliteal and calf veins including the posterior tibial, peroneal and gastrocnemius veins when visible. The superficial great saphenous vein was also interrogated. Spectral Doppler was utilized to evaluate flow at rest and with distal augmentation maneuvers in the common femoral, femoral and popliteal veins. COMPARISON:  None Available. FINDINGS: Contralateral Common Femoral Vein: Respiratory phasicity is normal and symmetric with the symptomatic side. No evidence of thrombus. Normal compressibility. Common Femoral Vein: No evidence of thrombus. Normal compressibility, respiratory phasicity and response to augmentation. Saphenofemoral Junction: No evidence of thrombus. Normal compressibility and flow on color Doppler imaging. Profunda Femoral Vein: No evidence of thrombus. Normal compressibility and flow on color Doppler imaging. Femoral Vein: No evidence of thrombus. Normal compressibility, respiratory phasicity and response to augmentation. Popliteal Vein: No evidence of thrombus. Normal compressibility, respiratory phasicity and response to augmentation. Calf Veins: No evidence of thrombus. Normal compressibility and flow on color Doppler imaging. Superficial Great Saphenous Vein: No evidence of thrombus. Normal compressibility and flow on color Doppler imaging. Other Findings:  None. IMPRESSION: Directed duplex right lower extremity negative for DVT Signed, Dulcy Fanny. Nadene Rubins, RPVI [facility] Electronically Signed   By: Corrie Mckusick D.O.   On: 05/24/2022 08:13   DG Abd 2 Views  Result Date: 05/20/2022 CLINICAL DATA:  Re-evaluate bowel  obstruction. Colonoscopy yesterday EXAM: ABDOMEN - 2 VIEW COMPARISON:  Yesterday FINDINGS: Upright and supine views. The upright view demonstrates cholecystectomy clips. No significant air fluid levels. No free intraperitoneal air. The supine view demonstrates a large amount of colonic stool. No gaseous distention of bowel loops. No abnormal abdominal calcifications. No appendicolith. IMPRESSION: 1. No acute findings. 2. Possible constipation. Electronically Signed   By: Abigail Miyamoto M.D.   On: 05/20/2022 08:31   DG Abd Portable 2V  Result Date: 05/19/2022 CLINICAL DATA:  Abdominal pain and constipation status post colonoscopy. EXAM: PORTABLE ABDOMEN - 2 VIEW COMPARISON:  CT abdomen pelvis 05/18/2022. FINDINGS: Stool is scattered throughout the colon with more than was seen in the colon on  yesterday's scout image. Visualized chest is unremarkable. IMPRESSION: Moderate constipation, with likely some improvement from yesterday's exam. Electronically Signed   By: Lorin Picket M.D.   On: 05/19/2022 15:51   US Venous Img Lower Bilateral (DVT)  Result Date: 05/19/2022 CLINICAL DATA:  History of right gastrocnemius DVT EXAM: BILATERAL LOWER EXTREMITY VENOUS DOPPLER ULTRASOUND TECHNIQUE: Gray-scale sonography with compression, as well as color and duplex ultrasound, were performed to evaluate the deep venous system(s) from the level of the common femoral vein through the popliteal and proximal calf veins. COMPARISON:  03/31/2022 FINDINGS: VENOUS Normal compressibility of the common femoral, superficial femoral, and popliteal veins, as well as the visualized calf veins including right gastrocnemius. Visualized portions of profunda femoral vein and great saphenous vein unremarkable. No filling defects to suggest DVT on grayscale or color Doppler imaging. Doppler waveforms show normal direction of venous flow, normal respiratory plasticity and response to augmentation. OTHER None. Limitations: none IMPRESSION: 1. No  evidence of lower extremity DVT. Right gastrocnemius vein is patent. Electronically Signed   By: Lucrezia Europe M.D.   On: 05/19/2022 10:15   CT ABDOMEN PELVIS W CONTRAST  Result Date: 05/18/2022 CLINICAL DATA:  Abdominal pain, acute, nonlocalized. EXAM: CT ABDOMEN AND PELVIS WITH CONTRAST TECHNIQUE: Multidetector CT imaging of the abdomen and pelvis was performed using the standard protocol following bolus administration of intravenous contrast. RADIATION DOSE REDUCTION: This exam was performed according to the departmental dose-optimization program which includes automated exposure control, adjustment of the mA and/or kV according to patient size and/or use of iterative reconstruction technique. CONTRAST:  158m OMNIPAQUE IOHEXOL 300 MG/ML  SOLN COMPARISON:  None Available. FINDINGS: Lower chest: No acute abnormality.  Small hiatal hernia Hepatobiliary: No enhancing intrahepatic mass. Cholecystectomy has been performed. Mild intra and extrahepatic biliary ductal dilation is present with the extrahepatic bile duct measuring up to 11 mm in diameter possibly representing post cholecystectomy change. Pancreas: Unremarkable Spleen: Unremarkable Adrenals/Urinary Tract: Adrenal glands are unremarkable. Kidneys are normal, without renal calculi, focal lesion, or hydronephrosis. Bladder is unremarkable. Stomach/Bowel: There is large volume stool seen within the ascending, transverse, and descending colon with a point of transition identified within the proximal sigmoid colon best seen on axial image # 59/2 and coronal image # 50/5. This may represent a post inflammatory stricture, however, an infiltrative mass as can be seen with a primary colonic malignancy is not excluded. There is moderate sigmoid diverticulosis noted distal to the stricture. The stomach and small bowel are unremarkable. Appendix normal. No free intraperitoneal gas or fluid. Vascular/Lymphatic: Aortic atherosclerosis. No enlarged abdominal or pelvic  lymph nodes. Reproductive: Status post hysterectomy. No adnexal masses. Other: No abdominal wall hernia. Mild subcutaneous edema noted within the flanks and proximal thighs bilaterally, right greater than left, nonspecific. Musculoskeletal: No acute bone abnormality. No lytic or blastic bone lesion identified. Incomplete segmentation of the T9-T12 vertebral bodies is incidentally noted. Degenerative changes are seen at the lumbosacral junction. IMPRESSION: Partial large bowel obstruction with large volume stool within the ascending, transverse, and descending colon with a point of transition identified within the proximal sigmoid colon. This may represent a post inflammatory stricture, however, an infiltrative mass as can be seen with a primary colonic malignancy is not excluded. Further evaluation with colonoscopy is recommended. Small hiatal hernia Aortic Atherosclerosis (ICD10-I70.0). Electronically Signed   By: AFidela SalisburyM.D.   On: 05/18/2022 21:03    Microbiology: Recent Results (from the past 240 hour(s))  Surgical PCR screen     Status:  None   Collection Time: 05/20/22  2:30 PM   Specimen: Nasal Mucosa; Nasal Swab  Result Value Ref Range Status   MRSA, PCR NEGATIVE NEGATIVE Final   Staphylococcus aureus NEGATIVE NEGATIVE Final    Comment: (NOTE) The Xpert SA Assay (FDA approved for NASAL specimens in patients 60 years of age and older), is one component of a comprehensive surveillance program. It is not intended to diagnose infection nor to guide or monitor treatment. Performed at [facility], [address] [place], [place] [address]      Labs: Basic Metabolic Panel: Recent Labs  Lab 05/22/22 [phone] 05/23/22 0601 05/24/22 0431 05/25/22 0346 05/26/22 0415 05/27/22 0411  NA 140 140 140 139 139 135  K 4.1 3.8 4.0 4.1 3.5 4.0  CL 110 110 112* 109 109 107  CO2 '25 24 22 26 27 23  '$ GLUCOSE 102* 97 97 120* 90 107*  BUN <5* <5* <5* 6* 6* 7*  CREATININE 0.38* 0.50 0.47  0.64 0.65 0.55  CALCIUM 9.0 9.4 9.1 8.6* 8.6* 8.6*  MG 2.3  --  2.1 1.9 1.9 2.0  PHOS  --   --   --   --  4.9* 3.7   Liver Function Tests: No results for input(s): "AST", "ALT", "ALKPHOS", "BILITOT", "PROT", "ALBUMIN" in the last 168 hours. No results for input(s): "LIPASE", "AMYLASE" in the last 168 hours. No results for input(s): "AMMONIA" in the last 168 hours. CBC: Recent Labs  Lab 05/24/22 0431 05/25/22 0346 05/26/22 0415 05/27/22 0411  WBC 5.5 11.9* 7.0 7.1  HGB 11.3* 12.0 10.8* 9.8*  HCT 34.5* 36.6 33.6* 30.3*  MCV 89.6 90.8 93.3 93.8  PLT 301 293 251 230   Cardiac Enzymes: No results for input(s): "CKTOTAL", "CKMB", "CKMBINDEX", "TROPONINI" in the last 168 hours. BNP: BNP (last 3 results) No results for input(s): "BNP" in the last 8760 hours.  ProBNP (last 3 results) No results for input(s): "PROBNP" in the last 8760 hours.  CBG: No results for input(s): "GLUCAP" in the last 168 hours.     Signed:  Kayleen Memos, MD [facility] 05/27/2022, 3:15 PM

## 2022-05-27 NOTE — Progress Notes (Signed)
[facility] Hospital Day(s): 9.   Post op day(s): 3 Days Post-Op.   Interval History:  Patient seen and examined No acute events or new complaints overnight.  Patient reports abdominal pain is improving No fever, chills, nausea, emesis She continues to be without leukocytosis; WBC 7.1K Hgb stable at 9.8 Renal function normal; sCr- 0.55; UO - unmeasured No electrolyte derangements She is on soft deit; tolerating well She is having gas and liquid stool from ileostomy; 230 ccs recorded  Vital signs in last 24 hours: [min-max] current  Temp:  [97.6 F (36.4 C)-97.9 F (36.6 C)] 97.6 F (36.4 C) (06/22 0344) Pulse Rate:  [74-98] 75 (06/22 0344) Resp:  [16] 16 (06/21 1928) BP: (99-118)/(56-62) 104/61 (06/22 0344) SpO2:  [95 %-99 %] 96 % (06/22 0344) Weight:  [80.6 kg] 80.6 kg (06/22 0345)     Height: '5\' 7"'$  (170.2 cm) Weight: 80.6 kg BMI (Calculated): 27.82   Intake/Output last 2 shifts:  06/21 0701 - 06/22 0700 In: 1040 [P.O.:1040] Out: 230 [Stool:230]   Physical Exam:  Constitutional: alert, cooperative and no distress  Respiratory: breathing non-labored at rest  Cardiovascular: regular rate and sinus rhythm  Gastrointestinal: Soft, expected incisional soreness; non-distended, no rebound/guarding. Loop ileostomy to the 10 o'clock position lateral to the umbilicus, pink, patent, red rubber catheter bridge in place. There is gas and liquid stool in bag. Integumentary: Laparoscopic incisions are CDI with staple and honeycomb; no erythema, no drainage   Labs:     Latest Ref Rng & Units 05/27/2022    4:11 AM 05/26/2022    4:15 AM 05/25/2022    3:46 AM  CBC  WBC 4.0 - 10.5 K/uL 7.1  7.0  11.9   Hemoglobin 12.0 - 15.0 g/dL 9.8  10.8  12.0   Hematocrit 36.0 - 46.0 % 30.3  33.6  36.6   Platelets 150 - 400 K/uL 230  251  293       Latest Ref Rng & Units 05/27/2022    4:11 AM 05/26/2022    4:15 AM 05/25/2022    3:46 AM  CMP  Glucose 70 -  99 mg/dL 107  90  120   BUN 8 - 23 mg/dL '7  6  6   '$ Creatinine 0.44 - 1.00 mg/dL 0.55  0.65  0.64   Sodium 135 - 145 mmol/L 135  139  139   Potassium 3.5 - 5.1 mmol/L 4.0  3.5  4.1   Chloride 98 - 111 mmol/L 107  109  109   CO2 22 - 32 mmol/L '23  27  26   '$ Calcium 8.9 - 10.3 mg/dL 8.6  8.6  8.6     Imaging studies: No new pertinent imaging studies   Assessment/Plan:  61 y.o. female 3 Days Post-Op s/p robotic assisted LAR with EEA and creation of diverting loop ileostomy for colonic stricture resulting in large bowel obstruction, complicated by mixed connective tissue disorder .   - Continue soft diet  - Monitor ileostomy function; record output. Anticipate this being very loose/liquid. If output >1.5-2L, she may need imodium. Would not start this yet.  - WOC for ostomy teaching; on-board  - Monitor abdominal examination   - Mobilization as tolerated; doing well  - Further management per primary service; we will of course follow  - Discharge Planning; Okay for discharge from surgical perspective, updated follow up appointment and instructions. Also sent Rx .  All of the above findings and recommendations were discussed  with the patient, patient's family (husband at bedside), and the medical team, and all of patient's and family's questions were answered to their expressed satisfaction.  -- Edison Simon, PA-C [facility] 05/27/2022, 7:25 AM M-F: 7am - 4pm

## 2022-05-31 ENCOUNTER — Ambulatory Visit: Payer: BC Managed Care – PPO | Attending: Family Medicine | Admitting: Occupational Therapy

## 2022-05-31 ENCOUNTER — Encounter: Payer: Self-pay | Admitting: Occupational Therapy

## 2022-05-31 DIAGNOSIS — I890 Lymphedema, not elsewhere classified: Secondary | ICD-10-CM | POA: Diagnosis present

## 2022-05-31 NOTE — Therapy (Addendum)
OUTPATIENT OCCUPATIONAL THERAPY EVALUATION: Oncology-related Lymphedema  Patient Name: Elizabeth Martin MRN: [id number] DOB:[birth year], 61 y.o., female Today's Date: 06/10/2022    Past Medical History:  Diagnosis Date   Asthma    Autoimmune disease ([place])    Cancer ([place])    Endometrial   Corneal abrasion, right 05/24/2022   H/O blood clots    Hypertension    Lymphedema    right leg   Past Surgical History:  Procedure Laterality Date   BASAL CELL CARCINOMA EXCISION Left    CHOLECYSTECTOMY  2008   FLEXIBLE SIGMOIDOSCOPY N/A 05/19/2022   Procedure: FLEXIBLE SIGMOIDOSCOPY;  Surgeon: Toledo, Benay Pike, MD;  Location: [facility];  Service: Gastroenterology;  Laterality: N/A;   HERNIA REPAIR  2004   PLANTAR FASCIECTOMY     ROBOTIC ASSISTED TOTAL HYSTERECTOMY WITH BILATERAL SALPINGO OOPHERECTOMY  02/14/2012   ROTATOR CUFF REPAIR Left 03/24/2022   TONSILLECTOMY     XI ROBOTIC ASSISTED LOWER ANTERIOR RESECTION N/A 05/24/2022   Procedure: XI ROBOTIC ASSISTED LOWER ANTERIOR RESECTION;  Surgeon: Olean Ree, MD;  Location: [facility] ORS;  Service: General;  Laterality: N/A;   Patient Active Problem List   Diagnosis Date Noted   Colonic stricture ([place]) 05/19/2022   Large bowel obstruction ([place]) 05/19/2022   Hypokalemia 05/18/2022   Abdominal pain 05/18/2022   Gastroesophageal reflux disease 02/15/2020   Essential hypertension 08/09/2017   Asthma 06/04/1969    PCP: Dianne Dun, MD  REFERRING PROVIDER: Dianne Dun, MD  REFERRING DIAG: I89.0 (ICD-10-CM) - Lymphedema, not elsewhere classified  THERAPY DIAG:  Lymphedema, not elsewhere classified - Plan: Ot plan of care cert/re-cert  Rationale for Evaluation and Treatment Rehabilitation  ONSET DATE: 02/14/2012   SUBJECTIVE                                                                                                                                                                                           SUBJECTIVE  STATEMENT:  Elizabeth Martin is referred to Occupational Therapy by her PCP, Dianne Dun, MD, for evaluation and treatment of BLE lymphedema 2/2 treatment for endometrial cancer in 2013. Elizabeth Martin is accompanied by her spouse, Elizabeth Martin, today. She takes extra time to walk to treatment room very slow using a  2 wheeled rolling walker. Elizabeth Martin is 15 minutes late for a 60 minute evaluation session. Pt reports onset of BLE swelling, R>L, in 2013, shortly after undergoing hysterectomy and LND for treatment for endometrial cancer. She tells me that LLE swelling was minimal with periodic fluctuation, and, although RLE swelling was worse, it was typically under control with compression stockings. She eventually obtained a basic sequential pneumatic compression device,  or "pump" to assist with managing swelling in her legs. Maela reports she used it intermittently only until recent exacerbation when she started using it daily , or 2 x daily on both legs.  She saw a lymphedema therapist in the past, but did not undergo treatment except for therapeutic exercises. Pt reports she has used both thigh and knee length compression stockings when needed, but she's not sure about the compression class. She is sure, however, that these no longer fit her swollen legs. Pt's goal for Occupational Therapy is to reduce limb swelling so she can resume preferred activities.  MEDICAL HISTORY RELEVANT TO LYMPHEDEMA:  S/p Large bowel obstruction 2/2 colon stricture. 05/2022. Pt s/p ileostomy. Exacerbation of BLE lymphedema s/p xi robotic resection. DVT ruled out. 03/24/22 L Rotator cuff repair HTN Asthma Endometrial Ca Dx 02/2012. S/P total laparoscopic hysterectomy and LND ( 30 bilateral pelvic and periaortic LN). Adjuvant chemotherapy and XRT. S/p bilateral salpingo oophorectomy Hx VTEs  *Pt reports Autoimmune disorder- Hashimoto's disease- no mention in medical record  PAIN:  Are you having pain? Yes NPRS scale: 3/10 Pain  location: RLE Pain orientation: Right  PAIN TYPE: aching Pain description: constant  Aggravating factors: standing, walking Relieving factors: elevation  PRECAUTIONS:  Lymphedema precautions- Thyroid Hx DVT   WEIGHT BEARING RESTRICTIONS No  FALLS:  Has patient fallen in last 6 months? No  LIVING ENVIRONMENT: Lives with: lives with their spouse Lives in: House/apartment Stairs: Yes: Internal: 17 steps; Master bathroom, bedroom and laundry facilities upstairs. 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  LEISURE: biking, hiking, walking, going to gym  HAND DOMINANCE : right   PRIOR LEVEL OF FUNCTION: Independent, Independent with basic and instrumental ADLs, Independent with household mobility without device, Independent with community mobility without device, Independent with homemaking with ambulation, Independent with gait, Independent with transfers, Independent with Vocation/Vocational requirements and productive activities, independent with leisure pursuits; independent with social participation  PATIENT GOALS Reduce leg swelling and pain so I can resume preferred activities   OBJECTIVE  COGNITION:  Overall cognitive status: Within functional limits for tasks assessed   STEMMER SIGN: Positive base of toes, R>L  OBSERVATIONS / OTHER ASSESSMENTS:  Intake FOTO  (Functional Outcome) score: 49/100 Lymphedema Life Impact Scale (LLIS): TBA OT Rx visit 1 BLE Comparative Limb Volumetrics: TBA OT Rx visit 1  Moderate, Stage II, RLE Lymphedema, and mild, stage I, LLE lymphedema 2/2 surgical and radiation interventions for endometrial cancer in 2013  Skin  Description Hyper-Keratosis Peau' de Orange Shiny Tight Fibrotic/ Indurated Fatty Doughy Spongy/ boggy    x x x x     x   Skin dry Flaky WNL Macerated   mildly x     Color Redness Present Varicosities Blanching Hemosiderin  Staining Mottled     x   x   Odor Malodorous Yeast Fungal infection  WNL      x   Temperature Warm Cool wnl    x     Pitting Edema   1+ 2+ 3+ 4+ Non-pitting       x    Girth Symmetrical Asymmetrical                   Distribution     R>L  Ankles to knees    Stemmer Sign Positive Negative   +    Lymphorrhea History Of:  Present Absent     x  Wounds History Of Present Absent Venous Arterial Pressure Mechanical     x        Signs of Infection Redness Warmth Erythema Acute Swelling Drainage Borders                    Sensation Light Touch Deep pressure Hypersensitivty   Present Impaired Present Impaired Absent Impaired   x  x  x     Nails WNL   Fungus nail dystrophy        Hair Growth Symmetrical Asymmetrical    TBA   Skin Creases Base of toes  Ankles   Base of Fingers knees       Abdominal pannus Thigh Lobules  Face/neck   x          SENSATION:  Light touch: intact. Hypersensitivity absent  Stereognosis: Appears intact  Hot/Cold: Appears intact  Proprioception: Appears intact  POSTURE: When seated hip angle open with posterior pelvic tilt. Shoulders slightly protracted, head forward. Bent at the hips slightly and looking down at the floor when  using walker  UPPER EXTREMITY AROM/PROM:  L shoulder NT 2/2 recent rotator cuff repair. Post Op PT interrupted by bowel obstruction; R shoulder WNL all planes by gross assessment Grip strength WFL Bly  LOWER EXTREMITY AROM/PROM: Hip flexion limited by post op pain and Ileostomy. Unable to bend to reach feet L knee WNL by gross assessment. R knee flexion limited by skin approximation 2/2 lymphedema Bilateral ankle AROM limitations  in all planes 2/2 swelling and skin tightness, R>L   LOWER EXTREMITY MMT: [facility] bilaterally  SURGERY TYPE/DATE: See SUBJECTIVE  Relevant PMH  NUMBER OF LYMPH NODES REMOVED: Unsure. OT counted 30 on Path report  CHEMOTHERAPY: yes  RADIATION:yes  HORMONE TREATMENT:  unknown  INFECTIONS: yes; Hospitalized for RLE cellulitis 2013  COMPARATIVE LIMB VOLUMETRICS: TBA OT Rx visit 1  LANDMARK RIGHT   eval  LEG  (A-D: ankle to tibial tuberosity ) ml  THIGH (E-G: knee - groin) ml  FULL LIMB (A-G: ankle-groin) ml     LIMB VOLUME DIFFERENTIAL (LVD) %     (Blank rows = not tested)  LANDMARK LEFT   eval  LEG  (A-D: ankle to tibial tuberosity ) ml  THIGH (E-G: knee - groin) ml  FULL LIMB (A-G: ankle-groin) ml     LIMB VOLUME DIFFERENTIAL %     (Blank rows = not tested)   GAIT: Distance walked: 200' Assistive device utilized: Environmental consultant - 2 wheeled Level of assistance: Modified independence Comments: slow, wide gait, hips flexed slightly, head fwd. No LOB   TODAY'S TREATMENT :\  PATIENT/ Family/ Caregiver EDUCATION:  Education details: Provided Pt and family education regarding lymphatic structure and function, etiology, onset patterns and stages of progression. Taught interaction between blood circulatory system and lymphatic circulation.Discussed  impact of gravity and co-morbidities on lymphatic function. Outlined Complete Decongestive Therapy (CDT)  as standard of care and provided in depth information regarding 4 primary components of Intensive and Self Management Phases, including Manual Lymph Drainage (MLD), compression wrapping and garments, skin care, and therapeutic exercise. Pilar Plate discussion with re need for frequent attendance and high burden of care when caregiver is needed, impact of co morbidities. We discussed  the chronic, progressive nature of lymphedema and Importance of daily, ongoing LE self-care essential for limiting progression and infection risk.  Person educated: Patient and Spouse Education method: Explanation, Demonstration, and Handouts Education comprehension: verbalized understanding, returned demonstration, and needs further education   HOME EXERCISE  PROGRAM: BLE lymphatic Pumping Therex 2x daily, 10 reps each, in  order  ASSESSMENT:  CLINICAL IMPRESSION: Elizabeth Martin is a 61 y.o. female presents with moderate, stage II, RLE lymphedema, and mild, stage I, LLE Lymphedema 2/2 impaired lymphatic function resulting from surgical intervention and radiation therapy for endometrial cancer.  Pt presents with limb swelling exacerbations bilaterally in the context of recent large bowel obstruction and resection. Prolonged inflammation secondary to this illness likely overloaded already compromised lower quadrant lymphatics resulting in a mechanical failure. Progressing BLE lymphedema limits Pt's functional performance in all occupational domains, including functional ambulation and mobility, basic and instrumental ADLs, productive activities and leisure pursuits, social participation, role performance and body image.  BLE lymphedema contributes to increased infection risk and increased falls risk due to body asymmetry. Mrs Martin will benefit from skilled OT for BLE CDT, treating RLE first and then LLE, with MLD, skin care, there ex and compression bandaging , then fitting appropriate compression garments after volume reductions are achieved. Throughout the treatment course Pt and caregiver will learns about lymphedema prevention and precautions and all lymphedema self care home program components. Because Elizabeth Martin is currently unable to reach her feet to complete LE self-care OT clinical visits, a consistent , daily caregiver is essential for achieving the optimal clinical outcome. Pt's spouse agrees to assist between visits with all home program components. Without skilled OT for lymphedema care, Pt's condition will progress and further functional decline is expected.  OBJECTIVE IMPAIRMENTS Abnormal gait, decreased knowledge of condition, decreased knowledge of use of DME, decreased mobility, difficulty walking, decreased ROM, increased edema, impaired sensation, impaired UE functional use, postural dysfunction, pain,  and increased tissue fibrosis, increased infection risk .   ACTIVITY LIMITATIONS impaired functional ambulation and mobility, difficulty carrying, lifting, bending, dependent sitting, standing, sleeping in bed, ascending/descending stairs, transfers, bed mobility, bathing, toileting, dressing, hygiene/grooming, caring for others, instrumental ADLs, including home management, driving, shopping, yard work; impaired leisure pursuits requiring standing and/ or walking, impaired social participation, impaired role performance, impaired body image    PARTICIPATION LIMITATIONS: meal prep, cleaning, laundry, interpersonal relationship, driving, shopping, community activity, occupation, yard work, school, and church  [place]  If not well managed, systemic fluid overload due to HTN may also contribute to lymphatic overload and lymphatic dysfunction. Pt ha an extensive list of medications. Some of these may contribute to limb swelling.   REHAB POTENTIAL: Good  CLINICAL DECISION MAKING: Evolving/moderate complexity  EVALUATION COMPLEXITY: Moderate  GOALS: Goals reviewed with patient? Yes  SHORT TERM GOALS: Target date:  4th OT Rx visit      Pt will demonstrate understanding of lymphedema prevention strategies by identifying signs and symptoms of cellulitis infection and by naming/ discussing 5 precautions/ prevention strategies using printed reference (modified assistance) to reduce risk of progression and to limit infection risk. Baseline: Max A Goal status: INITIAL  2.  With Max CG assistance Pt will be able to apply  multi-layer, short stretch compression wraps to one Rx limb using gradient techniques) to decrease limb volume, to limit infection risk, and to limit lymphedema progression.  Baseline: Dependent Goal status: INITIAL  LONG TERM GOALS: Target date: 08/29/2022   With mod caregiver assistance for daily home program performance, Given this patient's Intake score of 49/100 on the  functional outcomes FOTO tool, patient will experience an increase in function of 5 points to improve basic and instrumental ADLs performance, including lymphedema self-care. Baseline: 49/100% Goal status: INITIAL  2.  With Max CG  assistance for all LE home program components, Pt will achieve at least a 10% volume reduction in BLE to return it to more typical  size and shape,  to limit lymphedema progression and to limit infection risk. Baseline: Dependent Goal status: INITIAL  3.  With MAX CG ASSISTANCE Pt will achieve and sustain a least 85% compliance with all 4 LE self-care home program components throughout Intensive Phase CDT, including modified simple self-MLD, daily skin inspection and care, lymphatic pumping the ex and compression to sustain clinical gains made in CDT and to limit lymphedema progression and further functional decline. Baseline: Dependent Goal status: INITIAL  4.  With Max CG assistance and assistive devices Pt will be able to don and doff, appropriate thigh length daytime and HOS compression garments/ devices by issue date to limit lymphedema progression.  Baseline: Dependent Goal status: INITIAL  5.  With modified independence (extra time)Pt will be able to tolerate recommended full time daytime compression garments, and HOS compression devices ,  and to care for and replace compression garments according to recommended schedule   to decrease limb volume, to limit infection risk and to limit lymphedema progression.  Baseline: Dependent Goal status: INITIAL   PLAN: PT FREQUENCY: 2x/week  PT DURATION: 12 weeks, and PRN for optimal management of chronic, progressive lymphedema   PLANNED INTERVENTIONS: Intensive and Self-Management Phase Complete Decongestive Therapy (CDT), including manual lymphatic drainage (MLD), therapeutic lymphatic pumping exercises, skin care and compression, initially groin length, short stretch, multi-layer compression wraps to one limb at a  time( to limit falls risk); Therapeutic activities,  Patient/Family education, DME instructions, Manual lymph drainage, scar mobilization, Taping, fitting with appropriate compression garments/ devices. Pt will explore an advanced Flexitouch device that , unlike her basic pump, facilitates lymphatic function proximal to distal, including abdominal and pelvic lymph nodes.   PLAN FOR NEXT SESSION: Initial; BLE comparative limb volumetrics; RLE compression wraps, Pt/ family edu   Andrey Spearman, MS, OTR/L, CLT-LANA 06/10/22 11:59 AM '

## 2022-06-01 ENCOUNTER — Ambulatory Visit: Payer: BC Managed Care – PPO | Admitting: Occupational Therapy

## 2022-06-01 DIAGNOSIS — I890 Lymphedema, not elsewhere classified: Secondary | ICD-10-CM

## 2022-06-02 ENCOUNTER — Ambulatory Visit: Payer: BC Managed Care – PPO | Admitting: Occupational Therapy

## 2022-06-04 ENCOUNTER — Ambulatory Visit (INDEPENDENT_AMBULATORY_CARE_PROVIDER_SITE_OTHER): Payer: BC Managed Care – PPO | Admitting: Surgery

## 2022-06-04 ENCOUNTER — Encounter: Payer: Self-pay | Admitting: Surgery

## 2022-06-04 VITALS — BP 123/77 | HR 82 | Temp 98.0°F | Ht 67.0 in | Wt 166.2 lb

## 2022-06-04 DIAGNOSIS — K56609 Unspecified intestinal obstruction, unspecified as to partial versus complete obstruction: Secondary | ICD-10-CM

## 2022-06-04 DIAGNOSIS — Z09 Encounter for follow-up examination after completed treatment for conditions other than malignant neoplasm: Secondary | ICD-10-CM

## 2022-06-04 DIAGNOSIS — Z9049 Acquired absence of other specified parts of digestive tract: Secondary | ICD-10-CM

## 2022-06-04 DIAGNOSIS — K56699 Other intestinal obstruction unspecified as to partial versus complete obstruction: Secondary | ICD-10-CM

## 2022-06-04 NOTE — Progress Notes (Signed)
06/04/2022  HPI: Elizabeth Martin is a 61 y.o. female s/p robotic assisted low anterior resection with sigmoidectomy for a colonic stricture that was causing a partial large bowel obstruction on 05/24/2022.  Patient presents today for follow-up.  Patient reports that her pain continues to improve and is not as significant as it was when she was in the hospital.  She still having discomfort when coughing or straining or doing activity.  She has been working with her ostomy appliance to get the hang of things for the loop ileostomy.  She still feels weak or fatigued.  Vital signs: BP 123/77   Pulse 82   Temp 98 F (36.7 C) (Oral)   Ht '5\' 7"'$  (1.702 m)   Wt 166 lb 3.2 oz (75.4 kg)   SpO2 97%   BMI 26.03 kg/m    Physical Exam: Constitutional: No acute distress Abdomen: Soft, nondistended, appropriately tender to palpation.  Incisions are healing well and are clean, dry, intact.  Staples were removed today and replaced with Steri-Strips.  Ostomy appliance was changed today and the red rubber catheter acting as a bridge was removed today without complications.  New appliance was placed.  Assessment/Plan: This is a 61 y.o. female s/p robotic assisted low anterior resection.  - Patient is recovering well and discussed with her that the discomfort is not uncommon after surgery and that this will continue to improve.  Her staples were removed and Steri-Strips were applied.  Discussed with her that she can resume all her home medications at this point.  She can start ramping up her diet more as well. - Discussed with the patient the timeline for the next surgery to reverse the loop ileostomy.  We will set up for barium contrast enema study to be done in about 2 months to make sure that the colorectal anastomosis is intact and healing well and if that is the case, then we should be able to schedule her for ileostomy reversal soon after. - Patient will follow-up with me after the barium enema study is  done.   Melvyn Neth, [facility]

## 2022-06-04 NOTE — Patient Instructions (Addendum)
Your Barium Enema Study is scheduled for July 26, 2022 @ 9 am (arrive by 8:30 am)at [facility]. Nothing to eat or drink 3 hours prior. Check in with Radiology.   If you have any concerns or questions, please feel free to call our office. See follow up appointment below.   Colostomy Home Guide, Adult  A colostomy is a surgically created opening (ostomy) that brings a piece of the large intestine through an opening in the abdomen to create a stoma. The stoma allows stool (feces) and gas (flatus) to leave the body. A stoma may be a variety of shapes and sizes. An ostomy pouch or bag is used to cover the stoma and to contain the stool and gas. A colostomy may be temporary or permanent, depending on the medical reason for your surgery. After surgery, you will need to learn to care for your ostomy. This includes emptying and changing the colostomy bag as needed. There are many different types of bags or pouching options, including one-piece, two-piece, clear, fabric covered, flat or curved (convex), drainable, and closed or vented options. Your health care provider will help you select the best pouch for you. How to care for the stoma Your stoma should look pink, red, and moist, like the inside of your cheek. It should not be painful to touch, but it may bleed easily when rubbed or bumped. Soon after surgery, the stoma may be swollen, but this goes away within 6 weeks. To care for the stoma: Keep the skin around the stoma clean and dry. Use a clean, soft washcloth to gently wash the stoma and the skin around it. Clean using a circular motion, and wipe away from the stoma opening, nottoward it. Use warm water and only use cleansers, such as mild soap and stoma-safe adhesive remover, recommended by your health care provider. Inspect and dry the skin around the stoma. Use stoma powder or skin protectant on your skin as told by your health care provider. Do not use any other powders, gels, wipes, or  creams on the skin around the stoma. These may keep pouch adhesive from sticking or may cause injury to your stoma. Check the stoma area every day for signs of infection or problems. Check for: New or worsening redness, warmth, swelling, irritation, itching, or pain around the stoma. New or worsening changes to the stoma, such as bleeding, trauma, or a dark and dusky color. Changes to the drainage from the stoma, such as pus, blood, a large amount of liquid drainage, or little to no stool drainage. Measure the stoma opening regularly and record the size. Watch for changes, such as the stoma getting longer, becoming flat, or falling below skin level. (It is normal for the stoma to get smaller as the swelling goes away.) Share this information with your health care provider. Changes in the size or shape of the stoma may require a different style of pouch to be used. How to empty the colostomy bag     Empty your bag at bedtime, before physical activity or sexual relations, and whenever it is one-third to one-half full. Do not let the bag get more than half-full with stool or gas. The bag could leak if it gets too full. Some colostomy bags have a built-in gas release valve that releases gas often throughout the day. Follow these basic steps for emptying a drainable pouch: Prepare the toilet or container: If draining directly into the toilet, put several pieces of toilet paper into the  toilet water. This will prevent splashing as you empty the stool into the toilet. Sit far back on the toilet seat. If draining into a container, place a few pieces of moistened toilet paper in the bottom of the container. This can help when emptying the container. Remove the clip or the hook-and-loop fastener from the tail end of the bag. Unroll the tail, then empty the stool into the toilet or container. Clean the tail with toilet paper or a moist towelette. Reroll the tail, and close it with the clip or the  hook-and-loop fastener. Wash your hands. How to change the colostomy bag Change your bag every 3-4 days or as often as told by your health care provider. Also change the bag if it is leaking or separating from the skin, or if your skin around the stoma looks or feels irritated. Irritated skin may be a sign that the bag is leaking. Always have colostomy supplies with you, and follow these basic steps: Have paper towels or tissues and a trash bag nearby to clean any discharge and dispose of the soiled pouch. Remove the old pouching appliance. Use your fingers, a warm, moist cloth, or a stoma-safe adhesive remover to gently remove the pouch and remove adhesive residue. Clean the stoma area with water or with mild soap and water, as directed. Use water to rinse away any soap. Dry the skin. You may use the cool setting on a hair dryer to do this. Use a tracing pattern (template) to cut the skin barrier to the size needed. The opening should be large enough to fit around the stoma, but you should not see exposed skin. If you are using a two-piece bag, attach the bag and the base to each other. Add the barrier ring, if you use one. If directed, apply stoma powder or skin protectant to the skin. Warm the pouch base with your hands or blow with a hair dryer for 5-10 seconds. Remove the paper from the adhesive backing of the pouch base. Press the adhesive backing onto the skin around the stoma. Gently rub the pouch base onto the skin. This creates heat that helps the barrier to stick. Apply barrier strips to the edges of the pouch, if desired. Dispose of the soiled pouch, and wash your hands. General recommendations Avoid wearing tight clothes or having anything press directly on your stoma or bag. Change your clothing whenever it is soiled or damp. You may shower or bathe with the bag on or off. Do not use harsh or oily soaps or lotions. Dry the skin and bag after bathing. Do not shower or bathe right  after changing the pouch. Wait 4-5 hours. Store all supplies in a cool, dry place. Do not leave supplies in extreme heat because some parts can melt or not stick as well. Whenever you leave home, take extra clothing and an extra ostomy pouch with you. If your bag gets wet, you can dry it with a hair dryer on the cool setting. To prevent odor, you may put drops of ostomy deodorizer in the bag. If recommended by your health care provider, put ostomy lubricant inside the bag. This helps stool slide out of the bag more easily and completely. Contact a health care provider if: You have new or worsening redness, warmth, swelling, irritation, itching, or pain around the stoma. You have new or worsening changes to the stoma, such as bleeding, trauma, or a dark and dusky color. There are changes to the drainage from the  stoma, such as pus, blood, a large amount of liquid drainage, or little to no stool drainage. Your stoma extends in or out farther than normal. You need to change your bag every day or have frequent leaks. You have a fever. Get help right away if: Your stool is bloody. You have nausea or you vomit. You have trouble breathing. These symptoms may be an emergency. Get help right away. Call 911. Do not wait to see if the symptoms will go away. Do not drive yourself to the hospital. Summary Measure your stoma opening regularly and record the size. Watch for changes. Empty your bag at bedtime, before physical activity or sexual relations, and whenever it is one-third to one-half full. Do not let the bag get more than half-full with stool or gas. Change your bag every 3-4 days or as often as told by your health care provider. Whenever you leave home, take extra clothing and an extra ostomy pouch with you. This information is not intended to replace advice given to you by your health care provider. Make sure you discuss any questions you have with your health care provider. Document Revised:  12/02/2021 Document Reviewed: 12/02/2021 Elsevier Patient Education  [place].

## 2022-06-09 ENCOUNTER — Ambulatory Visit: Payer: BC Managed Care – PPO | Attending: Family Medicine | Admitting: Occupational Therapy

## 2022-06-09 DIAGNOSIS — I890 Lymphedema, not elsewhere classified: Secondary | ICD-10-CM | POA: Insufficient documentation

## 2022-06-10 ENCOUNTER — Encounter: Payer: BC Managed Care – PPO | Admitting: Occupational Therapy

## 2022-06-10 NOTE — Therapy (Signed)
[place] [facility] SERVICES [address] [place], [place], [address] Phone: [phone]   Fax:  [phone]  Occupational Therapy Treatment  Patient Details  Name: Elizabeth Martin MRN: [id number] Date of Birth: Apr 25, 1961 No data recorded  Encounter Date: 06/09/2022   OT End of Session - 06/10/22 1132     Visit Number 3    Number of Visits 36    Date for OT Re-Evaluation 08/29/22    OT Start Time 0200    OT Stop Time 0300    OT Time Calculation (min) 60 min    Activity Tolerance Patient tolerated treatment well;No increased pain;Patient limited by pain    Behavior During Therapy [facility] for tasks assessed/performed             Past Medical History:  Diagnosis Date   Asthma    Autoimmune disease ([place])    Cancer ([place])    Endometrial   Corneal abrasion, right 05/24/2022   H/O blood clots    Hypertension    Lymphedema    right leg    Past Surgical History:  Procedure Laterality Date   BASAL CELL CARCINOMA EXCISION Left    CHOLECYSTECTOMY  2008   FLEXIBLE SIGMOIDOSCOPY N/A 05/19/2022   Procedure: FLEXIBLE SIGMOIDOSCOPY;  Surgeon: Toledo, Benay Pike, MD;  Location: [facility] ENDOSCOPY;  Service: Gastroenterology;  Laterality: N/A;   HERNIA REPAIR  2004   PLANTAR FASCIECTOMY     ROBOTIC ASSISTED TOTAL HYSTERECTOMY WITH BILATERAL SALPINGO OOPHERECTOMY  02/14/2012   ROTATOR CUFF REPAIR Left 03/24/2022   TONSILLECTOMY     XI ROBOTIC ASSISTED LOWER ANTERIOR RESECTION N/A 05/24/2022   Procedure: XI ROBOTIC ASSISTED LOWER ANTERIOR RESECTION;  Surgeon: Olean Ree, MD;  Location: [facility] ORS;  Service: General;  Laterality: N/A;    There were no vitals filed for this visit.   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  Pt reports she uses her basic Lymphapress device , but feels it is too aggressive and squeezes her leg very hard.    Patient is accompanied by: Family member    Pertinent History S/p Large bowel obstruction 2/2 colon stricture. 05/2022. Pt s/p ileostomy.  Exacerbation of BLE lymphedema s/p xi robotic resection. DVT ruled out.  03/24/22 L Rotator cuff repair  HTN  Asthma  Endometrial Ca Dx 02/2012. S/P total laparoscopic hysterectomy and LND ( 30 bilateral pelvic and periaortic LN). Adjuvant chemotherapy and XRT. S/p bilateral salpingo oophorectomy  Hx VTEs   *Pt reports Autoimmune disorder- Hashimoto's disease- no mention in medical record    Limitations difficulty walking, post surgical pain, shoulder pain, chronic, progressive, BLE swelling and associated pain, impaired basic and instrumental ADLs, impaired productive activities and work, impaired leisure pursuits, impaired social participation    Special Tests FOTO Intake 49/100%    Currently in Pain? Yes    Pain Score --   not rated numerically this date   Pain Orientation Right    Pain Type Surgical pain;Chronic pain    Pain Onset --   post op                         OT Treatments/Exercises (OP) - 06/10/22 1319       ADLs   ADL Education Given Yes  Manual Therapy   Manual Therapy Edema management    Edema Management 60 minute Flexitouch trial at 30-50 mmHg                    OT Education - 06/09/22 1320     Education Details Pt/ family education for basic and advanced sequential pneumatic compression devices, or "pump", including clinical indications, how they work, how the basic device differs from the advanced, clinical indications, contraindications and precautions and care and use routines for pneumatic compression devices. Patient/ caregiver's questions were answered throughout trial and handouts and Internet resources given for reference.    Person(s) Educated Patient;Spouse    Methods  Demonstration;Explanation;Handout    Comprehension Verbalized understanding;Returned demonstration;Need further instruction                 OT Long Term Goals - 06/10/22 1148       OT LONG TERM GOAL #1   Title Given this patient's Intake score of 49/100% on the functional outcomes FOTO tool, patient will experience an increase in function of 5 points to 54, to improve basic and instrumental ADLs performance, including lymphedema self-care.    Baseline 49    Time 12    Period Weeks    Status New    Target Date 08/28/22      OT LONG TERM GOAL #2   Title With mod assist  Pt will be able to apply multilayer, thigh length, compression wraps using gradient techniques from base of toes to groin  to decrease limb volume, to limit infection risk, and to limit lymphedema progression.    Baseline dependent    Time 4    Period Days    Status New    Target Date --   4th OT Rx visit     OT LONG TERM GOAL #3   Title Pt will achieve at least a 10% limb volume reduction in RLE, and 3% reduction on the L,  from ankle to groin, to return limb to more normal size and shape, to limit infection risk, to decrease pain, to improve AROM and function, and to limit lymphedema progression.    Baseline Max A    Time 12    Period Weeks    Status New    Target Date 08/28/22      OT LONG TERM GOAL #4   Title Pt will achieve and sustain no less than 85% compliance with all LE self-care home program components throughout CDT, including modified simple self-MLD, daily skin care and inspection, lymphatic pumping the ex and appropriate compression to limit lymphedema progression and to limit further functional decline.    Baseline Max A    Time 12    Period Weeks    Status New    Target Date 08/28/22      OT LONG TERM GOAL #5   Title Pt will be able to don and doff appropriate daytime compression garments with Max caregiver assistance to limit re-accumulation of lymphatic congestion and to limit infection   risk and skin breakdown.    Baseline Max A    Time 12    Period Weeks    Status New    Target Date 08/28/22      Long Term Additional Goals   Additional Long Term Goals Yes      OT LONG TERM GOAL #6   Title Pt will be able to correctly perform all lymphedema self-care home program components using correct techniques with extra  time and assistive devices PRN (modified independence), including simple self MLD, lymphatic pumping exercise, don and doff appropriate compression garments/ devices, and perform daily skin care regime to manage chronic lymphedema over time and to limit infection risk.    Baseline Max A    Time 12    Period Weeks    Status New    Target Date 08/28/22                   Plan - 06/10/22 1311     OT Occupational Profile and History Detailed Assessment- Review of Records and additional review of physical, cognitive, psychosocial history related to current functional performance    Occupational performance deficits (Please refer to evaluation for details): ADL's;IADL's;Rest and Sleep;Work;Leisure;Social Participation    Body Structure / Function / Physical Skills ADL;Edema;Skin integrity;Pain;Gait;ROM;Decreased knowledge of precautions;Decreased knowledge of use of DME;IADL    Rehab Potential Good    Clinical Decision Making Several treatment options, min-mod task modification necessary    Comorbidities Affecting Occupational Performance: Presence of comorbidities impacting occupational performance    Modification or Assistance to Complete Evaluation  Min-Moderate modification of tasks or assist with assess necessary to complete eval    OT Frequency 2x / week    OT Duration 12 weeks    OT Treatment/Interventions Self-care/ADL training;Therapeutic exercise;Manual Therapy;Coping strategies training;Therapeutic activities;Manual lymph drainage;DME and/or AE instruction;Compression bandaging;Other (comment);Scar mobilization;Patient/family education   skin care  throughout MLD to limit infection risk   Plan BLE/BLQ CDT including MLD, skin care, compression wrapping, ther ex, fitting with appropriate compression garments/ devices when optimal reduction achieved. Pt will benefit from advanced sequential ppneumatic compression device, Flexitouch, for long term self-managemnt of LE after Intensive Phase CDT compete. Set up clinical trial ASAP.    Consulted and Agree with Plan of Care Patient;Family member/caregiver             Patient will benefit from skilled therapeutic intervention in order to improve the following deficits and impairments:   Body Structure / Function / Physical Skills: ADL, Edema, Skin integrity, Pain, Gait, ROM, Decreased knowledge of precautions, Decreased knowledge of use of DME, IADL       Visit Diagnosis: Lymphedema, not elsewhere classified    Problem List Patient Active Problem List   Diagnosis Date Noted   Colonic stricture ([place]) 05/19/2022   Large bowel obstruction ([place]) 05/19/2022   Hypokalemia 05/18/2022   Abdominal pain 05/18/2022   Gastroesophageal reflux disease 02/15/2020   Essential hypertension 08/09/2017   Asthma 06/04/1969    Andrey Spearman, MS, OTR/L, CLT-LANA 06/10/22 1:32 PM   [place] [facility] SERVICES [place], [place], [address] Phone: [phone]   Fax:  [phone]  Name: Elizabeth Martin MRN: [id number] Date of Birth: Apr 18, 1961

## 2022-06-11 NOTE — Therapy (Signed)
[place] [facility] [address] [place], [place], [address] Phone: [phone]   Fax:  [phone]  Occupational Therapy Treatment  Patient Details  Name: Elizabeth Martin MRN: [id number] Date of Birth: [birth year] No data recorded  Encounter Date: 06/01/2022   OT End of Session - 06/11/22 0928     Visit Number 2    Number of Visits 36    Date for OT Re-Evaluation 08/29/22    OT Start Time 0100    OT Stop Time 0150    OT Time Calculation (min) 50 min    Activity Tolerance Patient tolerated treatment well;No increased pain;Patient limited by pain    Behavior During Therapy [facility] for tasks assessed/performed             Past Medical History:  Diagnosis Date   Asthma    Autoimmune disease ([place])    Cancer ([place])    Endometrial   Corneal abrasion, right 05/24/2022   H/O blood clots    Hypertension    Lymphedema    right leg    Past Surgical History:  Procedure Laterality Date   BASAL CELL CARCINOMA EXCISION Left    CHOLECYSTECTOMY  2008   FLEXIBLE SIGMOIDOSCOPY N/A 05/19/2022   Procedure: FLEXIBLE SIGMOIDOSCOPY;  Surgeon: Toledo, Benay Pike, MD;  Location: [facility] ENDOS[facility];  Service: Gastroenterology;  Laterality: N/A;   HERNIA REPAIR  2004   PLANTAR FASCIECTOMY     ROBOTIC ASSISTED TOTAL HYSTERECTOMY WITH BILATERAL SALPINGO OOPHERECTOMY  02/14/2012   ROTATOR CUFF REPAIR Left 03/24/2022   TONSILLECTOMY     XI ROBOTIC ASSISTED LOWER ANTERIOR RESECTION N/A 05/24/2022   Procedure: XI ROBOTIC ASSISTED LOWER ANTERIOR RESECTION;  Surgeon: Olean Ree, MD;  Location: [facility] ORS;  Service: General;  Laterality: N/A;    There were no vitals filed for this visit.        LYMPHEDEMA/ONCOLOGY QUESTIONNAIRE - 06/11/22 0924       Right Lower Extremity Lymphedema   Other A-D= 4324.9 ml. E-G= 7505.8 ml. A-G= 11830.7 ml    Other LVD A-D= 28.07%. LVD E-G = 18.83%. LVD A-G= 22.21%      Left Lower Extremity Lymphedema   Other LLE  A-D= 3110.6 ml. E-G=6092.1 ml. A-G= 9202.7 ml.                     OT Treatments/Exercises (OP) - 06/11/22 8016       ADLs   ADL Education Given Yes      Manual Therapy   Manual Therapy Edema management;Compression Bandaging    Edema Management initial comparative limb volumetrics    Compression Bandaging RLE: One each short stretch wrap applied over single layer of stockinett and Rosidal foam - 8 and 10 cm wide bandages. Applied 3 12 cm wide wraps using overlapping gradient techniques to extend wraps to groin.                         OT Long Term Goals - 06/10/22 1148       OT LONG TERM GOAL #1   Title Given this patient's Intake score of 49/100% on the functional outcomes FOTO tool, patient will experience an increase in function of 5 points to 54, to improve basic and instrumental ADLs performance, including lymphedema self-care.    Baseline 49    Time 12    Period Weeks    Status New    Target Date 08/28/22  OT LONG TERM GOAL #2   Title With mod assist  Pt will be able to apply multilayer, thigh length, compression wraps using gradient techniques from base of toes to groin  to decrease limb volume, to limit infection risk, and to limit lymphedema progression.    Baseline dependent    Time 4    Period Days    Status New    Target Date --   4th OT Rx visit     OT LONG TERM GOAL #3   Title Pt will achieve at least a 10% limb volume reduction in RLE, and 3% reduction on the L,  from ankle to groin, to return limb to more normal size and shape, to limit infection risk, to decrease pain, to improve AROM and function, and to limit lymphedema progression.    Baseline Max A    Time 12    Period Weeks    Status New    Target Date 08/28/22      OT LONG TERM GOAL #4   Title Pt will achieve and sustain no less than 85% compliance with all LE self-care home program components throughout CDT, including modified simple self-MLD, daily skin care and  inspection, lymphatic pumping the ex and appropriate compression to limit lymphedema progression and to limit further functional decline.    Baseline Max A    Time 12    Period Weeks    Status New    Target Date 08/28/22      OT LONG TERM GOAL #5   Title Pt will be able to don and doff appropriate daytime compression garments with Max caregiver assistance to limit re-accumulation of lymphatic congestion and to limit infection  risk and skin breakdown.    Baseline Max A    Time 12    Period Weeks    Status New    Target Date 08/28/22      Long Term Additional Goals   Additional Long Term Goals Yes      OT LONG TERM GOAL #6   Title Pt will be able to correctly perform all lymphedema self-care home program components using correct techniques with extra time and assistive devices PRN (modified independence), including simple self MLD, lymphatic pumping exercise, don and doff appropriate compression garments/ devices, and perform daily skin care regime to manage chronic lymphedema over time and to limit infection risk.    Baseline Max A    Time 12    Period Weeks    Status New    Target Date 08/28/22                   Plan - 06/11/22 0930     Clinical Impression Statement Completed initial BLE comparative limb volumetrics, which reveal R leg volume is 28.7% greater on the R, R thigh volume is 18.83% greater than the L thigh, and R full limb volume from ankle to goin is 22.21% greater on the R than the L. These  volume differentials result in considerable body assymetry and functional impairments. Applied multilayer compression wraps from base of toes to groin using gradient techniques. Began teaching Pt and her spouse to apply raps. Cont as per POC.    OT Occupational Profile and History Detailed Assessment- Review of Records and additional review of physical, cognitive, psychosocial history related to current functional performance    Occupational performance deficits (Please  refer to evaluation for details): ADL's;IADL's;Rest and Sleep;Work;Leisure;Social Participation    Body Structure / Function / Physical Skills  ADL;Edema;Skin integrity;Pain;Gait;ROM;Decreased knowledge of precautions;Decreased knowledge of use of DME;IADL    Rehab Potential Good    Clinical Decision Making Several treatment options, min-mod task modification necessary    Comorbidities Affecting Occupational Performance: Presence of comorbidities impacting occupational performance    Modification or Assistance to Complete Evaluation  Min-Moderate modification of tasks or assist with assess necessary to complete eval    OT Frequency 2x / week    OT Duration 12 weeks    OT Treatment/Interventions Self-care/ADL training;Therapeutic exercise;Manual Therapy;Coping strategies training;Therapeutic activities;Manual lymph drainage;DME and/or AE instruction;Compression bandaging;Other (comment);Scar mobilization;Patient/family education   skin care throughout MLD to limit infection risk   Plan BLE/BLQ CDT including MLD, skin care, compression wrapping, ther ex, fitting with appropriate compression garments/ devices when optimal reduction achieved. Pt will benefit from advanced sequential ppneumatic compression device, Flexitouch, for long term self-managemnt of LE after Intensive Phase CDT compete. Set up clinical trial ASAP.    Consulted and Agree with Plan of Care Patient;Family member/caregiver             Patient will benefit from skilled therapeutic intervention in order to improve the following deficits and impairments:   Body Structure / Function / Physical Skills: ADL, Edema, Skin integrity, Pain, Gait, ROM, Decreased knowledge of precautions, Decreased knowledge of use of DME, IADL       Visit Diagnosis: Lymphedema, not elsewhere classified    Problem List Patient Active Problem List   Diagnosis Date Noted   Colonic stricture ([place]) 05/19/2022   Large bowel obstruction ([place]) 05/19/2022    Hypokalemia 05/18/2022   Abdominal pain 05/18/2022   Gastroesophageal reflux disease 02/15/2020   Essential hypertension 08/09/2017   Asthma 06/04/1969   Andrey Spearman, MS, OTR/L, CLT-LANA 06/11/22 9:31 AM   [facility] [address] [place], [place], [address] Phone: [phone]   Fax:  [phone]  Name: Sanja Elizardo MRN: [id number] Date of Birth: 1961/10/15

## 2022-06-11 NOTE — Patient Instructions (Signed)

## 2022-06-14 ENCOUNTER — Ambulatory Visit: Payer: BC Managed Care – PPO | Admitting: Occupational Therapy

## 2022-06-15 ENCOUNTER — Other Ambulatory Visit: Payer: Self-pay | Admitting: Urology

## 2022-06-16 ENCOUNTER — Ambulatory Visit: Payer: BC Managed Care – PPO | Admitting: Occupational Therapy

## 2022-06-21 ENCOUNTER — Encounter: Payer: BC Managed Care – PPO | Admitting: Occupational Therapy

## 2022-06-22 ENCOUNTER — Ambulatory Visit: Payer: BC Managed Care – PPO | Admitting: Occupational Therapy

## 2022-06-22 DIAGNOSIS — I890 Lymphedema, not elsewhere classified: Secondary | ICD-10-CM | POA: Diagnosis not present

## 2022-06-22 NOTE — Patient Instructions (Signed)

## 2022-06-22 NOTE — Therapy (Signed)
OUTPATIENT OCCUPATIONAL THERAPY TREATMENT NOTE   Patient Name: Elizabeth Martin MRN: [id number] DOB:[birth year], 61 y.o., female Today's Date: 06/22/2022  PCP: Elizabeth Dun, MD REFERRING PROVIDER:  Dianne Dun, MD   OT End of Session - 06/22/22 1421     Visit Number 3    Number of Visits 36    Date for OT Re-Evaluation 08/29/22    OT Start Time 0208    OT Stop Time 0310    OT Time Calculation (min) 62 min    Activity Tolerance Patient tolerated treatment well;No increased pain;Patient limited by pain    Behavior During Therapy [facility] for tasks assessed/performed             Past Medical History:  Diagnosis Date   Asthma    Autoimmune disease ([place])    Cancer ([place])    Endometrial   Corneal abrasion, right 05/24/2022   H/O blood clots    Hypertension    Lymphedema    right leg   Past Surgical History:  Procedure Laterality Date   BASAL CELL CARCINOMA EXCISION Left    CHOLECYSTECTOMY  2008   FLEXIBLE SIGMOIDOSCOPY N/A 05/19/2022   Procedure: FLEXIBLE SIGMOIDOSCOPY;  Surgeon: Martin, Elizabeth Pike, MD;  Location: [facility];  Service: Gastroenterology;  Laterality: N/A;   HERNIA REPAIR  2004   PLANTAR FASCIECTOMY     ROBOTIC ASSISTED TOTAL HYSTERECTOMY WITH BILATERAL SALPINGO OOPHERECTOMY  02/14/2012   ROTATOR CUFF REPAIR Left 03/24/2022   TONSILLECTOMY     XI ROBOTIC ASSISTED LOWER ANTERIOR RESECTION N/A 05/24/2022   Procedure: XI ROBOTIC ASSISTED LOWER ANTERIOR RESECTION;  Surgeon: Elizabeth Ree, MD;  Location: [facility] ORS;  Service: General;  Laterality: N/A;   Patient Active Problem List   Diagnosis Date Noted   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 05/2022. Pt s/p ileostomy. Exacerbation of BLE lymphedema s/p xi robotic resection. DVT ruled out. 03/24/22 L Rotator cuff repair HTN Asthma Endometrial Ca Dx 02/2012. S/P total laparoscopic hysterectomy and LND ( 30 bilateral pelvic and periaortic LN). Adjuvant chemotherapy and XRT. S/p bilateral salpingo oophorectomy Hx VTEs  *Pt reports Autoimmune disorder- Hashimoto's disease- no mention in medical record    PRECAUTIONS: LE Precautions: Hx cellulitis  SUBJECTIVE: Elizabeth Martin presents to OT for BLE lymphedema care. Pt is unaccompanied today. She presents to clinic without compression wraps in place. She states she's just started driving again after her surgery, and she doesn't feel like she can drive when the RLE is fully wrapped. OT reviewed the importance of consistent compression for optimal outcome.   PAIN:  Are you having pain? Yes: not rated numerically/10" Pain location: RLE , Pain description: heavy, tight, full,"Aggravating factors:standing, walking, hot weatherRelieving factors:elevation      OBJECTIVE:   TODAY'S TREATMENT:  Pt EDU for simple self-MLD RLE/RLQ MLD No compression-Pt did not bring wraps to clinic   PATIENT EDUCATION: Education details: Emphasis of skilled LE self-care training today on  simple self-MLD. Pt has mastered J stroke. She completed entire leg sequence with max assist and cues. Technique will improve with practice. Person educated: Patient Education method: Explanation, Demonstration, and Handouts Education comprehension:  verbalized understanding, returned demonstration, and needs further education   HOME EXERCISE PROGRAM BLE lymphatic pumping there\x- 2 x daily, 2 sets of 10 , in order     OT Long Term Goals - 06/10/22 1148       OT LONG TERM GOAL #1   Title Given this patient's Intake score of  49/100% on the functional outcomes FOTO tool, patient will experience an increase in function of 5 points to 54, to improve basic and instrumental ADLs performance, including lymphedema self-care.    Baseline 49    Time 12    Period Weeks    Status New    Target Date 08/28/22      OT LONG TERM GOAL #2   Title With mod assist  Pt will be able to apply multilayer, thigh length, compression wraps using gradient techniques from base of toes to groin  to decrease limb volume, to limit infection risk, and to limit lymphedema progression.    Baseline dependent    Time 4    Period Days    Status New    Target Date --   4th OT Rx visit     OT LONG TERM GOAL #3   Title Pt will achieve at least a 10% limb volume reduction in RLE, and 3% reduction on the L,  from ankle to groin, to return limb to more normal size and shape, to limit infection risk, to decrease pain, to improve AROM and function, and to limit lymphedema progression.    Baseline Max A    Time 12    Period Weeks    Status New    Target Date 08/28/22      OT LONG TERM GOAL #4   Title Pt will achieve and sustain no less than 85% compliance with all LE self-care home program components throughout CDT, including modified simple self-MLD, daily skin care and inspection, lymphatic pumping the ex and appropriate compression to limit lymphedema progression and to limit further functional decline.    Baseline Max A    Time 12    Period Weeks    Status New    Target Date 08/28/22      OT LONG TERM GOAL #5   Title Pt will be able to don and doff appropriate daytime compression garments with Max caregiver assistance to limit re-accumulation of lymphatic congestion and to limit infection  risk and skin breakdown.    Baseline Max A    Time 12    Period Weeks    Status New    Target Date 08/28/22      Long Term Additional Goals   Additional Long Term Goals Yes      OT LONG TERM GOAL #6   Title Pt will be able to correctly perform all  lymphedema self-care home program components using correct techniques with extra time and assistive devices PRN (modified independence), including simple self MLD, lymphatic pumping exercise, don and doff appropriate compression garments/ devices, and perform daily skin care regime to manage chronic lymphedema over time and to limit infection risk.    Baseline Max A    Time 12    Period Weeks    Status New    Target Date 08/28/22             Plan - 06/22/22 1538     Clinical Impression Statement Commenced RLE/ RLQ MLD utilizing modified  short neck     sequence ( no strokes to thyroid region), deep abdominal pathways  viaa diaphragmatic breathing, inguinal-axillary anastamosis, and proximal to distal J strokes from R groin to thigh, to knee, then leg and foot, completing entire sequence with 3 retrograde passes from foot to neck. Good tolerance. Anable to apply compression as wraps not brought to clinic. RLE is mildly reduced overall in volume since commencing CDT, but remains visibly swollen by an estimated 15-20 % by comparicon to LLE, with swelling generally resolved. Cont as per POC.    OT Occupational Profile and History Detailed Assessment- Review of Records and additional review of physical, cognitive, psychosocial history related to current functional performance    Occupational performance deficits (Please refer to evaluation for details): ADL's;IADL's;Rest and Sleep;Work;Leisure;Social Participation    Body Structure / Function / Physical Skills ADL;Edema;Skin integrity;Pain;Gait;ROM;Decreased knowledge of precautions;Decreased knowledge of use of DME;IADL    Rehab Potential Good    Clinical Decision Making Several treatment options, min-mod task modification necessary    Comorbidities Affecting Occupational Performance: Presence of comorbidities impacting occupational performance    Modification or Assistance to Complete Evaluation  Min-Moderate modification of tasks or assist with  assess necessary to complete eval    OT Frequency 2x / week    OT Duration 12 weeks    OT Treatment/Interventions Self-care/ADL training;Therapeutic exercise;Manual Therapy;Coping strategies training;Therapeutic activities;Manual lymph drainage;DME and/or AE instruction;Compression bandaging;Other (comment);Scar mobilization;Patient/family education   skin care throughout MLD to limit infection risk   Plan BLE/BLQ CDT including MLD, skin care, compression wrapping, ther ex, fitting with appropriate compression garments/ devices when optimal reduction achieved. Pt will benefit from advanced sequential ppneumatic compression device, Flexitouch, for long term self-managemnt of LE after Intensive Phase CDT compete. Set up clinical trial ASAP.    Consulted and Agree with Plan of Care Patient;Family member/caregiver              Andrey Spearman, MS, OTR/L, [facility] 06/22/22 3:47 PM

## 2022-06-23 ENCOUNTER — Encounter: Payer: BC Managed Care – PPO | Admitting: Occupational Therapy

## 2022-06-28 ENCOUNTER — Encounter: Payer: BC Managed Care – PPO | Admitting: Occupational Therapy

## 2022-06-30 ENCOUNTER — Encounter: Payer: Self-pay | Admitting: Occupational Therapy

## 2022-06-30 ENCOUNTER — Ambulatory Visit: Payer: BC Managed Care – PPO | Admitting: Occupational Therapy

## 2022-06-30 DIAGNOSIS — I890 Lymphedema, not elsewhere classified: Secondary | ICD-10-CM | POA: Diagnosis not present

## 2022-06-30 NOTE — Therapy (Signed)
OUTPATIENT OCCUPATIONAL THERAPY TREATMENT NOTE   Patient Name: Elizabeth Martin MRN: [id number] DOB:05-03-1961, 61 y.o., female Today's Date: 06/30/2022  PCP: Dianne Dun, MD REFERRING PROVIDER:  Dianne Dun, MD   OT End of Session - 06/30/22 0829     Visit Number 4    Number of Visits 36    Date for OT Re-Evaluation 08/29/22    OT Start Time 0800    OT Stop Time 0900    OT Time Calculation (min) 60 min    Activity Tolerance Patient tolerated treatment well;No increased pain;Patient limited by pain    Behavior During Therapy [facility] for tasks assessed/performed             Past Medical History:  Diagnosis Date   Asthma    Autoimmune disease ([place])    Cancer ([place])    Endometrial   Corneal abrasion, right 05/24/2022   H/O blood clots    Hypertension    Lymphedema    right leg   Past Surgical History:  Procedure Laterality Date   BASAL CELL CARCINOMA EXCISION Left    CHOLECYSTECTOMY  2008   FLEXIBLE SIGMOIDOSCOPY N/A 05/19/2022   Procedure: FLEXIBLE SIGMOIDOSCOPY;  Surgeon: Toledo, Benay Pike, MD;  Location: [facility] ENDOSCOPY;  Service: Gastroenterology;  Laterality: N/A;   HERNIA REPAIR  2004   PLANTAR FASCIECTOMY     ROBOTIC ASSISTED TOTAL HYSTERECTOMY WITH BILATERAL SALPINGO OOPHERECTOMY  02/14/2012   ROTATOR CUFF REPAIR Left 03/24/2022   TONSILLECTOMY     XI ROBOTIC ASSISTED LOWER ANTERIOR RESECTION N/A 05/24/2022   Procedure: XI ROBOTIC ASSISTED LOWER ANTERIOR RESECTION;  Surgeon: Olean Ree, MD;  Location: [facility] ORS;  Service: General;  Laterality: N/A;   Patient Active Problem List   Diagnosis Date Noted   Colonic stricture ([place]) 05/19/2022   Large bowel obstruction ([place]) 05/19/2022   Hypokalemia 05/18/2022   Abdominal pain 05/18/2022   Gastroesophageal reflux disease 02/15/2020   Essential hypertension 08/09/2017   Asthma 06/04/1969    ONSET DATE: 05/31/22  REFERRING DIAG: I89.0 Lymphedema  THERAPY DIAG:  Lymphedema, not elsewhere  classified  Rationale for Evaluation and Treatment Rehabilitation  PERTINENT HISTORY: S/p Large bowel obstruction 2/2 colon stricture. 05/2022. Pt s/p ileostomy. Exacerbation of BLE lymphedema s/p xi robotic resection. DVT ruled out. 03/24/22 L Rotator cuff repair HTN Asthma Endometrial Ca Dx 02/2012. S/P total laparoscopic hysterectomy and LND ( 30 bilateral pelvic and periaortic LN). Adjuvant chemotherapy and XRT. S/p bilateral salpingo oophorectomy Hx VTEs  *Pt reports Autoimmune disorder- Hashimoto's disease- no mention in medical record    PRECAUTIONS: LE Precautions: Hx cellulitis  SUBJECTIVE: Elizabeth Martin presents to OT for BLE lymphedema care. Pt is unaccompanied today. Pt reports she continues to wrap her RLE whenever she's at home. Pt states she and her husband have moved house and are now in the process of unpacking.   PAIN:  Are you having pain? Yes: 5/10"  Pain location: RLE ,abdomen, hips. Pain description: heavy, tight, full Aggravating factors:standing, walking, hot weather Relieving factors:elevation, Flexitoouch   OBJECTIVE:   TODAY'S TREATMENT:  1.RLE/RLQ MLD w/ emphasis on scar massage to R lateral abdomen, groin and ASIS region.(RLE/ RLQ MLD utilizing modified  short neck     sequence ( no strokes to thyroid region), deep abdominal pathways viaa diaphragmatic breathing, inguinal-axillary anastamosis, and proximal to distal J strokes from R groin to thigh, to knee, then leg and foot, completing entire sequence with 3 retrograde passes from foot to neck. ) 2. No compression-Pt will wrap once she  arrives home. 3. Kinesiotape sample to volar wrist to check tolerance of adhesive before applying it to sclerotic tissue   PATIENT EDUCATION: Education details: Pt edu focused on differences between custom  flat knit and off-the-shelf circular elastic compression garments.  Educated Pt re indications for both and pros and cons of each, and rational for therapist's current  recommendations  Educated Pt re estimated costs, measuring and fitting process ,DME vendor's involvement and access to insurance benefits.  Pt edu re scar tissue formation; kinesiotape for passive scar massage and to promote lymphatic function Person educated: Patient Education method: Explanation, Demonstration, and Handouts Education comprehension: verbalized understanding, returned demonstration, and needs further education   HOME EXERCISE PROGRAM BLE lymphatic pumping there\x- 2 x daily, 2 sets of 10 , in order     OT Long Term Goals - 06/10/22 1148       OT [place] #1   Title Given this patient's Intake score of 49/100% on the functional outcomes FOTO tool, patient will experience an increase in function of 5 points to 54, to improve basic and instrumental ADLs performance, including lymphedema self-care.    Baseline 49    Time 12    Period Weeks    Status New    Target Date 08/28/22      OT LONG TERM GOAL #2   Title With mod assist  Pt will be able to apply multilayer, thigh length, compression wraps using gradient techniques from base of toes to groin  to decrease limb volume, to limit infection risk, and to limit lymphedema progression.    Baseline dependent    Time 4    Period Days    Status New    Target Date --   4th OT Rx visit     OT LONG TERM GOAL #3   Title Pt will achieve at least a 10% limb volume reduction in RLE, and 3% reduction on the L,  from ankle to groin, to return limb to more normal size and shape, to limit infection risk, to decrease pain, to improve AROM and function, and to limit lymphedema progression.    Baseline Max A    Time 12    Period Weeks    Status New    Target Date 08/28/22      OT LONG TERM GOAL #4   Title Pt will achieve and sustain no less than 85% compliance with all LE self-care home program components throughout CDT, including modified simple self-MLD, daily skin care and inspection, lymphatic pumping the ex and appropriate  compression to limit lymphedema progression and to limit further functional decline.    Baseline Max A    Time 12    Period Weeks    Status New    Target Date 08/28/22      OT LONG TERM GOAL #5   Title Pt will be able to don and doff appropriate daytime compression garments with Max caregiver assistance to limit re-accumulation of lymphatic congestion and to limit infection  risk and skin breakdown.    Baseline Max A    Time 12    Period Weeks    Status New    Target Date 08/28/22      Long Term Additional Goals   Additional Long Term Goals Yes      OT LONG TERM GOAL #6   Title Pt will be able to correctly perform all lymphedema self-care home program components using correct techniques with extra time and assistive devices PRN (  modified independence), including simple self MLD, lymphatic pumping exercise, don and doff appropriate compression garments/ devices, and perform daily skin care regime to manage chronic lymphedema over time and to limit infection risk.    Baseline Max A    Time 12    Period Weeks    Status New    Target Date 08/28/22                Plan -       Clinical Impression Statement Continued RLE/ RLQ MLD as established with emphasis on scar massage and deeper fibrosis techniques. Good tolerance. Slight softening palpable at lateral abdomen at edge of fibrosis at end of session. Discussed compression garment options and recommendation for flat knit custom garments ( Elvarex thigh highs  with bike shorts over top to address need for improved containment over existing circular knit stocking . Bike shorts needed to address groin and abdominal fibrosis. Pt has [place] training today. Cont as per POC. Measure for garments next session.    OT Occupational Profile and History Detailed Assessment- Review of Records and additional review of physical, cognitive, psychosocial history related to current functional performance     Occupational performance deficits (Please refer  to evaluation for details): ADL's;IADL's;Rest and Sleep;Work;Leisure;Social Participation     Body Structure / Function / Physical Skills ADL;Edema;Skin integrity;Pain;Gait;ROM;Decreased knowledge of precautions;Decreased knowledge of use of DME;IADL     Rehab Potential Good     Clinical Decision Making Several treatment options, min-mod task modification necessary     Comorbidities Affecting Occupational Performance: Presence of comorbidities impacting occupational performance     Modification or Assistance to Complete Evaluation  Min-Moderate modification of tasks or assist with assess necessary to complete eval     OT Frequency 2x / week     OT Duration 12 weeks     OT Treatment/Interventions Self-care/ADL training;Therapeutic exercise;Manual Therapy;Coping strategies training;Therapeutic activities;Manual lymph drainage;DME and/or AE instruction;Compression bandaging;Other (comment);Scar mobilization;Patient/family education   skin care throughout MLD to limit infection risk    Plan BLE/BLQ CDT including MLD, skin care, compression wrapping, ther ex, fitting with appropriate compression garments/ devices when optimal reduction achieved. Pt will benefit from advanced sequential ppneumatic compression device, Flexitouch, for long term self-managemnt of LE after Intensive Phase CDT compete. Set up clinical trial ASAP.     Consulted and Agree with Plan of Care Patient;Family member/caregiver         Andrey Spearman, MS, OTR/L, CLT-LANA 06/30/22 9:20 AM

## 2022-07-02 ENCOUNTER — Encounter: Payer: Self-pay | Admitting: Surgery

## 2022-07-05 ENCOUNTER — Telehealth: Payer: Self-pay

## 2022-07-05 ENCOUNTER — Encounter: Payer: BC Managed Care – PPO | Admitting: Occupational Therapy

## 2022-07-05 NOTE — Telephone Encounter (Signed)
Barium Enema-rescheduled 07/27/22 @ 8 am [facility] nothing to eat/drink 3 hours prior.  Patient instructed to keep scheduled follow up 9/6/ with Dr.Piscoya.

## 2022-07-07 ENCOUNTER — Ambulatory Visit: Payer: BC Managed Care – PPO | Attending: Family Medicine | Admitting: Occupational Therapy

## 2022-07-07 DIAGNOSIS — I890 Lymphedema, not elsewhere classified: Secondary | ICD-10-CM | POA: Insufficient documentation

## 2022-07-07 NOTE — Therapy (Signed)
OUTPATIENT OCCUPATIONAL THERAPY TREATMENT NOTE   Patient Name: Elizabeth Martin MRN: [id number] DOB:10/05/1961, 61 y.o., female Today's Date: 07/07/2022  PCP: Dianne Dun, MD REFERRING PROVIDER:  Dianne Dun, MD   OT End of Session - 07/07/22 1020     Visit Number 5    Number of Visits 36    Date for OT Re-Evaluation 08/29/22    OT Start Time 0800    OT Stop Time 0900    OT Time Calculation (min) 60 min    Activity Tolerance Patient tolerated treatment well;No increased pain;Patient limited by pain    Behavior During Therapy [facility] for tasks assessed/performed             Past Medical History:  Diagnosis Date   Asthma    Autoimmune disease ([place])    Cancer ([place])    Endometrial   Corneal abrasion, right 05/24/2022   H/O blood clots    Hypertension    Lymphedema    right leg   Past Surgical History:  Procedure Laterality Date   BASAL CELL CARCINOMA EXCISION Left    CHOLECYSTECTOMY  2008   FLEXIBLE SIGMOIDOSCOPY N/A 05/19/2022   Procedure: FLEXIBLE SIGMOIDOSCOPY;  Surgeon: Toledo, Benay Pike, MD;  Location: [facility] ENDOSCOPY;  Service: Gastroenterology;  Laterality: N/A;   HERNIA REPAIR  2004   PLANTAR FASCIECTOMY     ROBOTIC ASSISTED TOTAL HYSTERECTOMY WITH BILATERAL SALPINGO OOPHERECTOMY  02/14/2012   ROTATOR CUFF REPAIR Left 03/24/2022   TONSILLECTOMY     XI ROBOTIC ASSISTED LOWER ANTERIOR RESECTION N/A 05/24/2022   Procedure: XI ROBOTIC ASSISTED LOWER ANTERIOR RESECTION;  Surgeon: Olean Ree, MD;  Location: [facility] ORS;  Service: General;  Laterality: N/A;   Patient Active Problem List   Diagnosis Date Noted   Colonic stricture ([place]) 05/19/2022   Large bowel obstruction ([place]) 05/19/2022   Hypokalemia 05/18/2022   Abdominal pain 05/18/2022   Gastroesophageal reflux disease 02/15/2020   Essential hypertension 08/09/2017   Asthma 06/04/1969    ONSET DATE: 05/31/22  REFERRING DIAG: I89.0 Lymphedema  THERAPY DIAG:  Lymphedema, not elsewhere  classified  Rationale for Evaluation and Treatment Rehabilitation  PERTINENT HISTORY: S/p Large bowel obstruction 2/2 colon stricture. 05/2022. Pt s/p ileostomy. Exacerbation of BLE lymphedema s/p xi robotic resection. DVT ruled out. 03/24/22 L Rotator cuff repair HTN Asthma Endometrial Ca Dx 02/2012. S/P total laparoscopic hysterectomy and LND ( 30 bilateral pelvic and periaortic LN). Adjuvant chemotherapy and XRT. S/p bilateral salpingo oophorectomy Hx VTEs  *Pt reports Autoimmune disorder- Hashimoto's disease- no mention in medical record    PRECAUTIONS: LE Precautions: Hx cellulitis  SUBJECTIVE: Elizabeth Martin presents to OT for BLE lymphedema care. Pt is unaccompanied today. Pt reports she has used Flexitouch device daily since completing training with manufacturer's rep.Pt has no new complaints. She is agreeable with plan to complete compression garment measurements today.  PAIN:  Are you having pain? No Pain location: RLE  Pain description: heavy, tight, full Aggravating factors:standing, walking, hot weather Relieving factors:compression, elevation, Flexitoouch   OBJECTIVE:   TODAY'S TREATMENT:  Completed anatomical measurements for custom daytime compression garments and RLE HOS device. Faxed to DME vendor after session.   PATIENT EDUCATION: Education details: Pt edu focused on differences between custom  flat knit and off-the-shelf circular elastic compression garments.  Educated Pt re indications for both and pros and cons of each, and rational for therapist's current recommendations  Educated Pt re estimated costs, measuring and fitting process ,DME vendor's involvement and access to insurance benefits.  Pt edu  re scar tissue formation; kinesiotape for passive scar massage and to promote lymphatic function Person educated: Patient Education method: Explanation, Demonstration, and Handouts Education comprehension: verbalized understanding, returned demonstration, and  needs further education   HOME PROGRAM BLE lymphatic pumping there\x- 2 x daily, 2 sets of 10 , in order Flexitouch sequential pneumatic compression device 1 x daily 2 RLE Custom Elvarex classic , flat knit, thigh high ccl 2 ( 23-32 mmHg) 2 Pr custom compression "bike shorts"- ccl 1, ccl 1 (18-18mHg) RLE custom [employer] RELAX HOS device, ccl 2 thigh high     OT Long Term Goals - 06/10/22 1148       OT LONG TERM GOAL #1   Title Given this patient's Intake score of 49/100% on the functional outcomes FOTO tool, patient will experience an increase in function of 5 points to 54, to improve basic and instrumental ADLs performance, including lymphedema self-care.    Baseline 49    Time 12    Period Weeks    Status New    Target Date 08/28/22      OT LONG TERM GOAL #2   Title With mod assist  Pt will be able to apply multilayer, thigh length, compression wraps using gradient techniques from base of toes to groin  to decrease limb volume, to limit infection risk, and to limit lymphedema progression.    Baseline dependent    Time 4    Period Days    Status New    Target Date --   4th OT Rx visit     OT LONG TERM GOAL #3   Title Pt will achieve at least a 10% limb volume reduction in RLE, and 3% reduction on the L,  from ankle to groin, to return limb to more normal size and shape, to limit infection risk, to decrease pain, to improve AROM and function, and to limit lymphedema progression.    Baseline Max A    Time 12    Period Weeks    Status New    Target Date 08/28/22      OT LONG TERM GOAL #4   Title Pt will achieve and sustain no less than 85% compliance with all LE self-care home program components throughout CDT, including modified simple self-MLD, daily skin care and inspection, lymphatic pumping the ex and appropriate compression to limit lymphedema progression and to limit further functional decline.    Baseline Max A    Time 12    Period Weeks    Status New    Target Date  08/28/22      OT LONG TERM GOAL #5   Title Pt will be able to don and doff appropriate daytime compression garments with Max caregiver assistance to limit re-accumulation of lymphatic congestion and to limit infection  risk and skin breakdown.    Baseline Max A    Time 12    Period Weeks    Status New    Target Date 08/28/22      Long Term Additional Goals   Additional Long Term Goals Yes      OT LONG TERM GOAL #6   Title Pt will be able to correctly perform all lymphedema self-care home program components using correct techniques with extra time and assistive devices PRN (modified independence), including simple self MLD, lymphatic pumping exercise, don and doff appropriate compression garments/ devices, and perform daily skin care regime to manage chronic lymphedema over time and to limit infection risk.    Baseline Max  A    Time 12    Period Weeks    Status New    Target Date 08/28/22                Plan -       Clinical Impression Statement Completed anatomical measurements for custom compression stockings and HOS device. Flat knit fabric chosen to ensure optimal fit and to facilitate improved lymphatic flow above the inguinal LN, and through dense fibrosis at groin and lower abdomen ( 2/2  XRT and mixed connective tissue disease) towards the thoracic duct. HOS device is medically necessary to limit fibrosis formation in the limb and thorax during HOS when lymphatic flow is minimally active.Pt actively participated in all aspects of garment specification and measurement. We'll fit them as soon as they are shippped from manufacturer . Cont as per POC.     OT Occupational Profile and History Detailed Assessment- Review of Records and additional review of physical, cognitive, psychosocial history related to current functional performance     Occupational performance deficits (Please refer to evaluation for details): ADL's;IADL's;Rest and Sleep;Work;Leisure;Social Participation      Body Structure / Function / Physical Skills ADL;Edema;Skin integrity;Pain;Gait;ROM;Decreased knowledge of precautions;Decreased knowledge of use of DME;IADL     Rehab Potential Good     Clinical Decision Making Several treatment options, min-mod task modification necessary     Comorbidities Affecting Occupational Performance: Presence of comorbidities impacting occupational performance     Modification or Assistance to Complete Evaluation  Min-Moderate modification of tasks or assist with assess necessary to complete eval     OT Frequency 2x / week     OT Duration 12 weeks     OT Treatment/Interventions Self-care/ADL training;Therapeutic exercise;Manual Therapy;Coping strategies training;Therapeutic activities;Manual lymph drainage;DME and/or AE instruction;Compression bandaging;Other (comment);Scar mobilization;Patient/family education   skin care throughout MLD to limit infection risk    Plan BLE/BLQ CDT including MLD, skin care, compression wrapping, ther ex, fitting with appropriate compression garments/ devices when optimal reduction achieved. Pt will benefit from advanced sequential ppneumatic compression device, Flexitouch, for long term self-managemnt of LE after Intensive Phase CDT compete. Set up clinical trial ASAP.     Consulted and Agree with Plan of Care Patient;Family member/caregiver        Andrey Spearman, MS, OTR/L, CLT-LANA 07/07/22 10:31 AM

## 2022-07-12 ENCOUNTER — Ambulatory Visit: Payer: BC Managed Care – PPO | Admitting: Occupational Therapy

## 2022-07-13 ENCOUNTER — Telehealth: Payer: Self-pay

## 2022-07-13 DIAGNOSIS — Z7189 Other specified counseling: Secondary | ICD-10-CM

## 2022-07-13 DIAGNOSIS — L940 Localized scleroderma [morphea]: Secondary | ICD-10-CM | POA: Insufficient documentation

## 2022-07-13 NOTE — Telephone Encounter (Signed)
Spoke with patient to let her know referral was placed for Ostomy care.

## 2022-07-14 ENCOUNTER — Ambulatory Visit: Payer: BC Managed Care – PPO | Admitting: Occupational Therapy

## 2022-07-14 DIAGNOSIS — I890 Lymphedema, not elsewhere classified: Secondary | ICD-10-CM | POA: Diagnosis not present

## 2022-07-15 NOTE — Therapy (Signed)
OUTPATIENT OCCUPATIONAL THERAPY TREATMENT NOTE   Patient Name: Elizabeth Martin MRN: [id number] DOB:[birth year], 61 y.o., female Today's Date: 07/15/2022  PCP: Elizabeth Dun, MD REFERRING PROVIDER:  Dianne Dun, MD   OT End of Session - 07/14/22 0825     Visit Number 6    Number of Visits 36    Date for OT Re-Evaluation 08/29/22    OT Start Time 0800    OT Stop Time 0900    OT Time Calculation (min) 60 min    Activity Tolerance Patient tolerated treatment well;No increased pain;Patient limited by pain    Behavior During Therapy [facility] for tasks assessed/performed             Past Medical History:  Diagnosis Date   Asthma    Autoimmune disease (Elizabeth Martin)    Cancer ([place])    Endometrial   Corneal abrasion, right 05/24/2022   H/O blood clots    Hypertension    Lymphedema    right leg   Past Surgical History:  Procedure Laterality Date   BASAL CELL CARCINOMA EXCISION Left    CHOLECYSTECTOMY  2008   FLEXIBLE SIGMOIDOSCOPY N/A 05/19/2022   Procedure: FLEXIBLE SIGMOIDOSCOPY;  Surgeon: Elizabeth Martin, Elizabeth Pike, MD;  Location: [facility] ENDOSCOPY;  Service: Gastroenterology;  Laterality: N/A;   HERNIA REPAIR  2004   PLANTAR FASCIECTOMY     ROBOTIC ASSISTED TOTAL HYSTERECTOMY WITH BILATERAL SALPINGO OOPHERECTOMY  02/14/2012   ROTATOR CUFF REPAIR Left 03/24/2022   TONSILLECTOMY     XI ROBOTIC ASSISTED LOWER ANTERIOR RESECTION N/A 05/24/2022   Procedure: XI ROBOTIC ASSISTED LOWER ANTERIOR RESECTION;  Surgeon: Elizabeth Ree, MD;  Location: [facility] ORS;  Service: General;  Laterality: N/A;   Patient Active Problem List   Diagnosis Date Noted   Colonic stricture ([place]) 05/19/2022   Large bowel obstruction ([place]) 05/19/2022   Hypokalemia 05/18/2022   Abdominal pain 05/18/2022   Gastroesophageal reflux disease 02/15/2020   Essential hypertension 08/09/2017   Asthma 06/04/1969    ONSET DATE: 05/31/22  REFERRING DIAG: I89.0 Lymphedema  THERAPY DIAG:  Lymphedema, not elsewhere  classified  Rationale for Evaluation and Treatment Rehabilitation  PERTINENT HISTORY: S/p Large bowel obstruction 2/2 colon stricture. 05/2022. Pt s/p ileostomy. Exacerbation of BLE lymphedema s/p xi robotic resection. DVT ruled out. 03/24/22 L Rotator cuff repair HTN Asthma Endometrial Ca Dx 02/2012. S/P total laparoscopic hysterectomy and LND ( 30 bilateral pelvic and periaortic LN). Adjuvant chemotherapy and XRT. S/p bilateral salpingo oophorectomy Hx VTEs  *Pt reports Autoimmune disorder- Hashimoto's disease- no mention in medical record    PRECAUTIONS: LE Precautions: Hx cellulitis  SUBJECTIVE: Elizabeth Martin presents to OT for BLE lymphedema care. Pt is unaccompanied today. Elizabeth Martin reports she saw a new dermatologist yesterday who diagnosed her with scleroderma and prescribed medications that should help reduce skin    changes  in the groin area. We reviewed lymphatic anatomy in that area and discussed the impact of dense fibrosis on lymphatic function.  PAIN:  Are you having pain? No Pain location: RLE  Pain description: heavy, tight, full Aggravating factors:standing, walking, hot weather Relieving factors:compression, elevation, Flexitoouch   OBJECTIVE:   TODAY'S TREATMENT:  MLD as established in supine and side lying directing fluid towards ipsilateral inguinal - axillary anastomosis.   PATIENT EDUCATION: Education details: Pt edu focused on differences between custom  flat knit and off-the-shelf circular elastic compression garments.  Educated Pt re indications for both and pros and cons of each, and rational for therapist's current recommendations  Educated Pt re  estimated costs, measuring and fitting process ,DME vendor's involvement and access to insurance benefits.  Pt edu re scar tissue formation; kinesiotape for passive scar massage and to promote lymphatic function Person educated: Patient Education method: Explanation, Demonstration, and Handouts Education  comprehension: verbalized understanding, returned demonstration, and needs further education   HOME PROGRAM BLE lymphatic pumping there\x- 2 x daily, 2 sets of 10 , in order Flexitouch sequential pneumatic compression device 1 x daily 2 RLE Custom Elvarex classic , flat knit, thigh high ccl 2 ( 23-32 mmHg) 2 Pr custom compression "bike shorts"- ccl 1, ccl 1 (18-32mHg) RLE custom Jobst RELAX HOS device, ccl 2 thigh high     OT Long Term Goals - 06/10/22 1148       OT LONG TERM GOAL #1   Title Given this patient's Intake score of 49/100% on the functional outcomes FOTO tool, patient will experience an increase in function of 5 points to 54, to improve basic and instrumental ADLs performance, including lymphedema self-care.    Baseline 49    Time 12    Period Weeks    Status New    Target Date 08/28/22      OT LONG TERM GOAL #2   Title With mod assist  Pt will be able to apply multilayer, thigh length, compression wraps using gradient techniques from base of toes to groin  to decrease limb volume, to limit infection risk, and to limit lymphedema progression.    Baseline dependent    Time 4    Period Days    Status New    Target Date --   4th OT Rx visit     OT LONG TERM GOAL #3   Title Pt will achieve at least a 10% limb volume reduction in RLE, and 3% reduction on the L,  from ankle to groin, to return limb to more normal size and shape, to limit infection risk, to decrease pain, to improve AROM and function, and to limit lymphedema progression.    Baseline Max A    Time 12    Period Weeks    Status New    Target Date 08/28/22      OT LONG TERM GOAL #4   Title Pt will achieve and sustain no less than 85% compliance with all LE self-care home program components throughout CDT, including modified simple self-MLD, daily skin care and inspection, lymphatic pumping the ex and appropriate compression to limit lymphedema progression and to limit further functional decline.     Baseline Max A    Time 12    Period Weeks    Status New    Target Date 08/28/22      OT LONG TERM GOAL #5   Title Pt will be able to don and doff appropriate daytime compression garments with Max caregiver assistance to limit re-accumulation of lymphatic congestion and to limit infection  risk and skin breakdown.    Baseline Max A    Time 12    Period Weeks    Status New    Target Date 08/28/22      Long Term Additional Goals   Additional Long Term Goals Yes      OT LONG TERM GOAL #6   Title Pt will be able to correctly perform all lymphedema self-care home program components using correct techniques with extra time and assistive devices PRN (modified independence), including simple self MLD, lymphatic pumping exercise, don and doff appropriate compression garments/ devices, and perform daily skin care  regime to manage chronic lymphedema over time and to limit infection risk.    Baseline Max A    Time 12    Period Weeks    Status New    Target Date 08/28/22                Plan -       Clinical Impression Statement MLD as established in supine and side lying directing fluid towards ipsilateral inguinal - axillary anastomosis. Continued fibrosis techniques and scar massage to R inguinal fold area. Excellent tolerance . Minimal change palpated during and after MLD.  Cont as per POC.     OT Occupational Profile and History Detailed Assessment- Review of Records and additional review of physical, cognitive, psychosocial history related to current functional performance     Occupational performance deficits (Please refer to evaluation for details): ADL's;IADL's;Rest and Sleep;Work;Leisure;Social Participation     Body Structure / Function / Physical Skills ADL;Edema;Skin integrity;Pain;Gait;ROM;Decreased knowledge of precautions;Decreased knowledge of use of DME;IADL     Rehab Potential Good     Clinical Decision Making Several treatment options, min-mod task modification necessary      Comorbidities Affecting Occupational Performance: Presence of comorbidities impacting occupational performance     Modification or Assistance to Complete Evaluation  Min-Moderate modification of tasks or assist with assess necessary to complete eval     OT Frequency 2x / week     OT Duration 12 weeks     OT Treatment/Interventions Self-care/ADL training;Therapeutic exercise;Manual Therapy;Coping strategies training;Therapeutic activities;Manual lymph drainage;DME and/or AE instruction;Compression bandaging;Other (comment);Scar mobilization;Patient/family education   skin care throughout MLD to limit infection risk    Plan BLE/BLQ CDT including MLD, skin care, compression wrapping, ther ex, fitting with appropriate compression garments/ devices when optimal reduction achieved. Pt will benefit from advanced sequential ppneumatic compression device, Flexitouch, for long term self-managemnt of LE after Intensive Phase CDT compete. Set up clinical trial ASAP.     Consulted and Agree with Plan of Care Patient;Family member/caregiver        Andrey Spearman, MS, OTR/L, [facility] 07/15/22 8:27 AM

## 2022-07-16 ENCOUNTER — Other Ambulatory Visit: Payer: BC Managed Care – PPO

## 2022-07-19 ENCOUNTER — Other Ambulatory Visit: Payer: BC Managed Care – PPO

## 2022-07-19 ENCOUNTER — Ambulatory Visit: Payer: BC Managed Care – PPO | Admitting: Occupational Therapy

## 2022-07-20 ENCOUNTER — Encounter: Payer: Self-pay | Admitting: Surgery

## 2022-07-21 ENCOUNTER — Ambulatory Visit: Payer: BC Managed Care – PPO | Admitting: Occupational Therapy

## 2022-07-21 DIAGNOSIS — I890 Lymphedema, not elsewhere classified: Secondary | ICD-10-CM | POA: Diagnosis not present

## 2022-07-21 NOTE — Therapy (Signed)
OUTPATIENT OCCUPATIONAL THERAPY TREATMENT NOTE   Patient Name: Elizabeth Martin MRN: [id number] DOB:[birth year], 61 y.o., female Today's Date: 07/21/2022  PCP: Dianne Dun, MD REFERRING PROVIDER:  Dianne Dun, MD   OT End of Session - 07/21/22 0810     Visit Number 7    Number of Visits 36    Date for OT Re-Evaluation 08/29/22    OT Start Time 0810    Activity Tolerance Patient tolerated treatment well;No increased pain;Patient limited by pain    Behavior During Therapy [facility] for tasks assessed/performed             Past Medical History:  Diagnosis Date   Asthma    Autoimmune disease ([place])    Cancer ([place])    Endometrial   Corneal abrasion, right 05/24/2022   H/O blood clots    Hypertension    Lymphedema    right leg   Past Surgical History:  Procedure Laterality Date   BASAL CELL CARCINOMA EXCISION Left    CHOLECYSTECTOMY  2008   FLEXIBLE SIGMOIDOSCOPY N/A 05/19/2022   Procedure: FLEXIBLE SIGMOIDOSCOPY;  Surgeon: [place], Benay Pike, MD;  Location: [facility] END[facility];  Service: Gastroenterology;  Laterality: N/A;   HERNIA REPAIR  2004   PLANTAR FASCIECTOMY     ROBOTIC ASSISTED TOTAL HYSTERECTOMY WITH BILATERAL SALPINGO OOPHERECTOMY  02/14/2012   ROTATOR CUFF REPAIR Left 03/24/2022   TONSILLECTOMY     XI ROBOTIC ASSISTED LOWER ANTERIOR RESECTION N/A 05/24/2022   Procedure: XI ROBOTIC ASSISTED LOWER ANTERIOR RESECTION;  Surgeon: Olean Ree, MD;  Location: [facility] ORS;  Service: General;  Laterality: N/A;   Patient Active Problem List   Diagnosis Date Noted   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 05/2022. Pt s/p ileostomy. Exacerbation of BLE lymphedema s/p xi robotic resection. DVT ruled out. 03/24/22 L Rotator cuff repair HTN Asthma Endometrial Ca Dx 02/2012. S/P total laparoscopic hysterectomy and LND ( 30 bilateral pelvic and periaortic LN). Adjuvant chemotherapy and XRT. S/p bilateral salpingo oophorectomy Hx VTEs  *Pt reports Autoimmune disorder- Hashimoto's disease- no mention in medical record    PRECAUTIONS: LE Precautions: Hx cellulitis  SUBJECTIVE: Elizabeth Martin presents to OT for BLE lymphedema care. Pt is unaccompanied today. Mrs. Klinck reports she used her pneumatic device earlier this week. She feels her new medication is helping to soften sclerotic areas inferior to her abdomen.  PAIN:  Are you having pain? yes Pain location:L shoulder Pain description: sore, tender, post surgivcal Aggravating factors:: LE related pain: standing, walking, hot weather Relieving factors:compression, elevation, Flexitoouch   OBJECTIVE:   TODAY'S TREATMENT:  MLD as established in supine and side lying directing fluid towards ipsilateral inguinal - axillary anastomosis.  Scar massage Fibrosis techniques  PATIENT EDUCATION: Continued Pt/ CG edu for lymphedema self care home program throughout session. Topics include outcome of comparative limb volumetrics- starting limb volume differentials (LVDs), technology and gradient techniques used for short stretch, multilayer compression wrapping, simple self-MLD, therapeutic lymphatic pumping exercises, skin/nail care, LE precautions,. compression garment recommendations and specifications, wear and care schedule and compression garment donning / doffing w assistive devices. Discussed progress towards all OT goals since commencing  CDT. All questions answered to the Pt's satisfaction. Good return. Pt edu re scar tissue formation; kinesiotape for passive scar  massage and to promote lymphatic function Person educated: Patient Education method: Explanation, Demonstration, and Handouts Education comprehension: verbalized understanding, returned demonstration, and needs further education   HOME PROGRAM BLE lymphatic pumping there\x- 2 x daily, 2 sets of 10 , in order Flexitouch sequential pneumatic compression device 1 x daily 2 RLE Custom Elvarex classic , flat knit, thigh high ccl 2 ( 23-32 mmHg) 2 Pr custom compression "bike shorts"- ccl 1, ccl 1 (18-14mHg) RLE custom Jobst RELAX HOS device, ccl 2 thigh high     OT Long Term Goals - 06/10/22 1148       OT LONG TERM GOAL #1   Title Given this patient's Intake score of 49/100% on the functional outcomes FOTO tool, patient will experience an increase in function of 5 points to 54, to improve basic and instrumental ADLs performance, including lymphedema self-care.    Baseline 49    Time 12    Period Weeks    Status New    Target Date 08/28/22      OT LONG TERM GOAL #2   Title With mod assist  Pt will be able to apply multilayer, thigh length, compression wraps using gradient techniques from base of toes to groin  to decrease limb volume, to limit infection risk, and to limit lymphedema progression.    Baseline dependent    Time 4    Period Days    Status New    Target Date --   4th OT Rx visit     OT LONG TERM GOAL #3   Title Pt will achieve at least a 10% limb volume reduction in RLE, and 3% reduction on the L,  from ankle to groin, to return limb to more normal size and shape, to limit infection risk, to decrease pain, to improve AROM and function, and to limit lymphedema progression.    Baseline Max A    Time 12    Period Weeks    Status New    Target Date 08/28/22      OT LONG TERM GOAL #4   Title Pt will achieve and sustain no less than 85% compliance with all LE self-care home program components throughout CDT, including modified simple self-MLD, daily skin care and  inspection, lymphatic pumping the ex and appropriate compression to limit lymphedema progression and to limit further functional decline.    Baseline Max A    Time 12    Period Weeks    Status New    Target Date 08/28/22      OT LONG TERM GOAL #5   Title Pt will be able to don and doff appropriate daytime compression garments with Max caregiver assistance to limit re-accumulation of lymphatic congestion and to limit infection  risk and skin breakdown.    Baseline Max A    Time 12    Period Weeks    Status New    Target Date 08/28/22      Long Term Additional Goals   Additional Long Term Goals Yes      OT LONG TERM GOAL #6   Title Pt will be able to correctly perform all lymphedema self-care home program components using correct techniques with extra time and assistive devices PRN (modified independence), including simple self MLD, lymphatic pumping exercise, don and doff appropriate compression garments/ devices, and perform daily skin care regime to manage chronic lymphedema  over time and to limit infection risk.    Baseline Max A    Time 12    Period Weeks    Status New    Target Date 08/28/22                Plan -       Clinical Impression Statement Pt tolerated MLD  and scar massage/ fibrosis techniques to RLE/RLQ  as established in supine and side lying directing fluid towards ipsilateral inguinal - axillary anastomosis. Continued fibrosis techniques and scar massage to R inguinal fold area and inferior abdomen. Slight increase in tissue extensibility palpated at inferior abdomen today. Pt started methotrexate about a week ago. She denies side effects to date. Cont as per POC.     OT Occupational Profile and History Detailed Assessment- Review of Records and additional review of physical, cognitive, psychosocial history related to current functional performance     Occupational performance deficits (Please refer to evaluation for details): ADL's;IADL's;Rest and  Sleep;Work;Leisure;Social Participation     Body Structure / Function / Physical Skills ADL;Edema;Skin integrity;Pain;Gait;ROM;Decreased knowledge of precautions;Decreased knowledge of use of DME;IADL     Rehab Potential Good     Clinical Decision Making Several treatment options, min-mod task modification necessary     Comorbidities Affecting Occupational Performance: Presence of comorbidities impacting occupational performance     Modification or Assistance to Complete Evaluation  Min-Moderate modification of tasks or assist with assess necessary to complete eval     OT Frequency 2x / week     OT Duration 12 weeks     OT Treatment/Interventions Self-care/ADL training;Therapeutic exercise;Manual Therapy;Coping strategies training;Therapeutic activities;Manual lymph drainage;DME and/or AE instruction;Compression bandaging;Other (comment);Scar mobilization;Patient/family education   skin care throughout MLD to limit infection risk    Plan BLE/BLQ CDT including MLD, skin care, compression wrapping, ther ex, fitting with appropriate compression garments/ devices when optimal reduction achieved. Pt will benefit from advanced sequential ppneumatic compression device, Flexitouch, for long term self-managemnt of LE after Intensive Phase CDT compete. Set up clinical trial ASAP.     Consulted and Agree with Plan of Care Patient;Family member/caregiver        Andrey Spearman, MS, OTR/L, [facility] 07/21/22 11:32 AM

## 2022-07-23 ENCOUNTER — Encounter (HOSPITAL_COMMUNITY)
Admission: RE | Admit: 2022-07-23 | Discharge: 2022-07-23 | Disposition: A | Discharge status: Home / Self Care | Payer: BC Managed Care – PPO | Source: Ambulatory Visit | Attending: Surgery | Admitting: Surgery

## 2022-07-23 DIAGNOSIS — L24B3 Irritant contact dermatitis related to fecal or urinary stoma or fistula: Secondary | ICD-10-CM

## 2022-07-23 DIAGNOSIS — Z432 Encounter for attention to ileostomy: Secondary | ICD-10-CM | POA: Diagnosis not present

## 2022-07-23 DIAGNOSIS — Z7189 Other specified counseling: Secondary | ICD-10-CM | POA: Insufficient documentation

## 2022-07-23 DIAGNOSIS — K56609 Unspecified intestinal obstruction, unspecified as to partial versus complete obstruction: Secondary | ICD-10-CM | POA: Insufficient documentation

## 2022-07-23 NOTE — Progress Notes (Signed)
[facility]   Reason for visit:  RLQ ileostomy.  Pouching going well. Has some intestinal gas at times, causing pouch to pop off at times. Eats a plant based diet primarily.  HPI:  Diverticulosis with stricture  Large bowel obstruction ROS  Review of Systems  Gastrointestinal:  Positive for abdominal pain.       RMQ ileostomy Complaining of stomal pain, most likely from skin breakdown  Skin:  Positive for rash.       Peristomal breakdown  Psychiatric/Behavioral: Negative.    All other systems reviewed and are negative.  Vital signs:  Temp 97.6 F (36.4 C) (Oral)  Exam:  Physical Exam Vitals reviewed.  Constitutional:      Appearance: Normal appearance.  Abdominal:     Palpations: Abdomen is soft.     Comments: RMQ ileostomy  Skin:    Findings: Rash present.  Neurological:     Mental Status: She is alert and oriented to person, place, and time.  Psychiatric:        Mood and Affect: Mood normal.        Behavior: Behavior normal.     Stoma type/location:  RMQ ileostomy.  Complains of pain around stoma.  Denuded hypergranulated tissue noted Stomal assessment/size:  1 1/4"  Peristomal assessment:  denuded skin from 4 to 7 o'clock.  Patient irrigates pouch after emptying.  I have informed her that this is likely causing water to sit on her skin.  Also, if her filter gets wet, it is no longer working and could be contributing to pouches popping off.   Treatment options for stomal/peristomal skin:  Did not keep 1 piece pouch system recommended.  Felt it was difficult to apply.  Did not use barrier ring recommended.  Instead opted for barrier paste Output: soft brown stool. At times is liquid, more of a  soft texture in the evenings Ostomy pouching: 2pc. 2 1/4"  was wearing 2 3/4" but encouraged to try 2 1/4" pouch.  May be more discrete under clothes.  Education provided:  We try 2 1/4" pouch with stoma powder, skin prep and barrier ring.  She will stop irrigating  pouch, avoid getting filter wet.  I show her that her liquid effluent may also be getting the filter wet.     Impression/dx  COntact dermatitis   Discussion  See above  Plan  See back in in 7-10 days to recheck skin breakdown.     Visit time: 55 minutes.   Domenic Moras FNP-BC

## 2022-07-23 NOTE — Discharge Instructions (Addendum)
Switched to 2 1/4Psychiatrist Item # [id number] Barrier Item # [id number]

## 2022-07-26 ENCOUNTER — Other Ambulatory Visit (HOSPITAL_COMMUNITY): Payer: Self-pay | Admitting: Physician Assistant

## 2022-07-26 ENCOUNTER — Ambulatory Visit: Payer: BC Managed Care – PPO

## 2022-07-26 ENCOUNTER — Encounter: Payer: BC Managed Care – PPO | Admitting: Occupational Therapy

## 2022-07-27 ENCOUNTER — Other Ambulatory Visit: Payer: Self-pay | Admitting: Surgery

## 2022-07-27 ENCOUNTER — Encounter: Payer: Self-pay | Admitting: Surgery

## 2022-07-27 ENCOUNTER — Ambulatory Visit
Admission: RE | Admit: 2022-07-27 | Discharge: 2022-07-27 | Disposition: A | Discharge status: Home / Self Care | Payer: BC Managed Care – PPO | Source: Ambulatory Visit | Attending: Surgery | Admitting: Surgery

## 2022-07-27 DIAGNOSIS — K56699 Other intestinal obstruction unspecified as to partial versus complete obstruction: Secondary | ICD-10-CM | POA: Insufficient documentation

## 2022-07-27 MED ORDER — IOHEXOL 300 MG/ML  SOLN
450.0000 mL | Freq: Once | INTRAMUSCULAR | Status: AC | PRN
  Administered 2022-07-27: 450 mL

## 2022-07-28 ENCOUNTER — Telehealth: Payer: Self-pay | Admitting: Surgery

## 2022-07-28 ENCOUNTER — Other Ambulatory Visit: Payer: Self-pay

## 2022-07-28 ENCOUNTER — Ambulatory Visit: Payer: BC Managed Care – PPO | Admitting: Occupational Therapy

## 2022-07-28 DIAGNOSIS — I890 Lymphedema, not elsewhere classified: Secondary | ICD-10-CM

## 2022-07-28 MED ORDER — NEOMYCIN SULFATE 500 MG PO TABS: ORAL_TABLET | ORAL | 0 refills | Status: DC

## 2022-07-28 MED ORDER — POLYETHYLENE GLYCOL 3350 17 GM/SCOOP PO POWD: ORAL | 0 refills | Status: DC

## 2022-07-28 MED ORDER — METRONIDAZOLE 500 MG PO TABS: ORAL_TABLET | ORAL | 0 refills | Status: DC

## 2022-07-28 MED ORDER — BISACODYL 5 MG PO TBEC: DELAYED_RELEASE_TABLET | ORAL | 0 refills | Status: DC

## 2022-07-28 NOTE — Telephone Encounter (Signed)
Patient has been advised of Pre-Admission date/time, and Surgery date.  Surgery Date: 08/24/22 @ [facility] Preadmission Testing Date: 08/16/22 (phone 8a-1p)  Patient has been made aware to call [phone], between 1-3:00pm the day before surgery, to find out what time to arrive for surgery.    Mailed to patient colon prep prior to surgery and Rx for prep called into her pharmacy.

## 2022-07-28 NOTE — Therapy (Signed)
OUTPATIENT OCCUPATIONAL THERAPY TREATMENT NOTE   Patient Name: Deneen Slager MRN: [id number] DOB:15-Sep-1961, 61 y.o., female Today's Date: 07/28/2022  PCP: Dianne Dun, MD REFERRING PROVIDER:  Dianne Dun, MD   OT End of Session - 07/28/22 0824     Visit Number 8    Number of Visits 36    Date for OT Re-Evaluation 08/29/22    OT Start Time 0806    OT Stop Time 0907    OT Time Calculation (min) 61 min    Activity Tolerance Patient tolerated treatment well;No increased pain;Patient limited by pain    Behavior During Therapy [facility] for tasks assessed/performed             Past Medical History:  Diagnosis Date   Asthma    Autoimmune disease ([place])    Cancer ([place])    Endometrial   Corneal abrasion, right 05/24/2022   H/O blood clots    Hypertension    Lymphedema    right leg   Past Surgical History:  Procedure Laterality Date   BASAL CELL CARCINOMA EXCISION Left    CHOLECYSTECTOMY  2008   FLEXIBLE SIGMOIDOSCOPY N/A 05/19/2022   Procedure: FLEXIBLE SIGMOIDOSCOPY;  Surgeon: Toledo, Benay Pike, MD;  Location: [facility] ENDOSCOPY;  Service: Gastroenterology;  Laterality: N/A;   HERNIA REPAIR  2004   PLANTAR FASCIECTOMY     ROBOTIC ASSISTED TOTAL HYSTERECTOMY WITH BILATERAL SALPINGO OOPHERECTOMY  02/14/2012   ROTATOR CUFF REPAIR Left 03/24/2022   TONSILLECTOMY     XI ROBOTIC ASSISTED LOWER ANTERIOR RESECTION N/A 05/24/2022   Procedure: XI ROBOTIC ASSISTED LOWER ANTERIOR RESECTION;  Surgeon: Olean Ree, MD;  Location: [facility] ORS;  Service: General;  Laterality: N/A;   Patient Active Problem List   Diagnosis Date Noted   Colonic stricture ([place]) 05/19/2022   Large bowel obstruction ([place]) 05/19/2022   Hypokalemia 05/18/2022   Abdominal pain 05/18/2022   Gastroesophageal reflux disease 02/15/2020   Essential hypertension 08/09/2017   Asthma 06/04/1969    ONSET DATE: 05/31/22  REFERRING DIAG: I89.0 Lymphedema  THERAPY DIAG:  Lymphedema, not elsewhere  classified  Rationale for Evaluation and Treatment Rehabilitation  PERTINENT HISTORY: S/p Large bowel obstruction 2/2 colon stricture. 05/2022. Pt s/p ileostomy. Exacerbation of BLE lymphedema s/p xi robotic resection. DVT ruled out. 03/24/22 L Rotator cuff repair HTN Asthma Endometrial Ca Dx 02/2012. S/P total laparoscopic hysterectomy and LND ( 30 bilateral pelvic and periaortic LN). Adjuvant chemotherapy and XRT. S/p bilateral salpingo oophorectomy Hx VTEs  *Pt reports Autoimmune disorder- Hashimoto's disease,Connective Tissue Disease    PRECAUTIONS: LE Precautions: Hx cellulitis, ileostomy  SUBJECTIVE: Viola Kinnick presents to OT for BLE lymphedema care. Pt is unaccompanied today. Mrs. Kreitzer reports she;s been cleared to have ileostomy reversal surgery, and she is so pleased.  PAIN:  Are you having pain? yes Pain location:L shoulder Pain description: sore, tender, post surgivcal Aggravating factors:: LE related pain: standing, walking, hot weather Relieving factors:compression, elevation, Flexitoouch   OBJECTIVE:   TODAY'S TREATMENT:  MLD as established in supine and side lying directing fluid towards ipsilateral inguinal - axillary anastomosis.  Scar massage Fibrosis techniques  PATIENT EDUCATION: Continued Pt/ CG edu for lymphedema self care home program throughout session. Topics include outcome of comparative limb volumetrics- starting limb volume differentials (LVDs), technology and gradient techniques used for short stretch, multilayer compression wrapping, simple self-MLD, therapeutic lymphatic pumping exercises, skin/nail care, LE precautions,. compression garment recommendations and specifications, wear and care schedule and compression garment donning / doffing w assistive devices. Discussed progress towards all  OT goals since commencing CDT. All questions answered to the Pt's satisfaction. Good return. Pt edu re scar tissue formation; kinesiotape for passive scar  massage and to promote lymphatic function Person educated: Patient Education method: Explanation, Demonstration, and Handouts Education comprehension: verbalized understanding, returned demonstration, and needs further education   HOME PROGRAM BLE lymphatic pumping there\x- 2 x daily, 2 sets of 10 , in order Flexitouch sequential pneumatic compression device 1 x daily 2 RLE Custom Elvarex classic , flat knit, thigh high ccl 2 ( 23-32 mmHg) 2 Pr custom compression "bike shorts"- ccl 1, ccl 1 (18-80mHg) RLE custom Jobst RELAX HOS device, ccl 2 thigh high     OT Long Term Goals - 06/10/22 1148       OT LONG TERM GOAL #1   Title Given this patient's Intake score of 49/100% on the functional outcomes FOTO tool, patient will experience an increase in function of 5 points to 54, to improve basic and instrumental ADLs performance, including lymphedema self-care.    Baseline 49    Time 12    Period Weeks    Status New    Target Date 08/28/22      OT LONG TERM GOAL #2   Title With mod assist  Pt will be able to apply multilayer, thigh length, compression wraps using gradient techniques from base of toes to groin  to decrease limb volume, to limit infection risk, and to limit lymphedema progression.    Baseline dependent    Time 4    Period Days    Status New    Target Date --   4th OT Rx visit     OT LONG TERM GOAL #3   Title Pt will achieve at least a 10% limb volume reduction in RLE, and 3% reduction on the L,  from ankle to groin, to return limb to more normal size and shape, to limit infection risk, to decrease pain, to improve AROM and function, and to limit lymphedema progression.    Baseline Max A    Time 12    Period Weeks    Status New    Target Date 08/28/22      OT LONG TERM GOAL #4   Title Pt will achieve and sustain no less than 85% compliance with all LE self-care home program components throughout CDT, including modified simple self-MLD, daily skin care and  inspection, lymphatic pumping the ex and appropriate compression to limit lymphedema progression and to limit further functional decline.    Baseline Max A    Time 12    Period Weeks    Status New    Target Date 08/28/22      OT LONG TERM GOAL #5   Title Pt will be able to don and doff appropriate daytime compression garments with Max caregiver assistance to limit re-accumulation of lymphatic congestion and to limit infection  risk and skin breakdown.    Baseline Max A    Time 12    Period Weeks    Status New    Target Date 08/28/22      Long Term Additional Goals   Additional Long Term Goals Yes      OT LONG TERM GOAL #6   Title Pt will be able to correctly perform all lymphedema self-care home program components using correct techniques with extra time and assistive devices PRN (modified independence), including simple self MLD, lymphatic pumping exercise, don and doff appropriate compression garments/ devices, and perform daily skin care regime  to manage chronic lymphedema over time and to limit infection risk.    Baseline Max A    Time 12    Period Weeks    Status New    Target Date 08/28/22                Plan -       Clinical Impression Statement Pt tolerated MLD  and scar massage/ fibrosis techniques to RLE/RLQ  as established in supine and side lying directing fluid towards ipsilateral inguinal - axillary anastomosis. Continued fibrosis techniques and scar massage to R inguinal fold area and inferior abdomen.  Pt continues to make progress towards all OT goals for lymphedema management. Cont as per POC.     OT Occupational Profile and History Detailed Assessment- Review of Records and additional review of physical, cognitive, psychosocial history related to current functional performance     Occupational performance deficits (Please refer to evaluation for details): ADL's;IADL's;Rest and Sleep;Work;Leisure;Social Participation     Body Structure / Function / Physical  Skills ADL;Edema;Skin integrity;Pain;Gait;ROM;Decreased knowledge of precautions;Decreased knowledge of use of DME;IADL     Rehab Potential Good     Clinical Decision Making Several treatment options, min-mod task modification necessary     Comorbidities Affecting Occupational Performance: Presence of comorbidities impacting occupational performance     Modification or Assistance to Complete Evaluation  Min-Moderate modification of tasks or assist with assess necessary to complete eval     OT Frequency 2x / week     OT Duration 12 weeks     OT Treatment/Interventions Self-care/ADL training;Therapeutic exercise;Manual Therapy;Coping strategies training;Therapeutic activities;Manual lymph drainage;DME and/or AE instruction;Compression bandaging;Other (comment);Scar mobilization;Patient/family education   skin care throughout MLD to limit infection risk    Plan BLE/BLQ CDT including MLD, skin care, compression wrapping, ther ex, fitting with appropriate compression garments/ devices when optimal reduction achieved. Pt will benefit from advanced sequential ppneumatic compression device, Flexitouch, for long term self-managemnt of LE after Intensive Phase CDT compete. Set up clinical trial ASAP.     Consulted and Agree with Plan of Care Patient;Family member/caregiver        Andrey Spearman, MS, OTR/L, [facility] 07/28/22 12:35 PM

## 2022-08-02 ENCOUNTER — Encounter: Payer: BC Managed Care – PPO | Admitting: Occupational Therapy

## 2022-08-04 ENCOUNTER — Ambulatory Visit: Payer: BC Managed Care – PPO | Admitting: Occupational Therapy

## 2022-08-04 DIAGNOSIS — I890 Lymphedema, not elsewhere classified: Secondary | ICD-10-CM

## 2022-08-04 NOTE — Therapy (Signed)
OUTPATIENT OCCUPATIONAL THERAPY TREATMENT NOTE   Patient Name: Elizabeth Martin MRN: [id number] DOB:[birth year], 61 y.o., female Today's Date: 08/04/2022  PCP: Elizabeth Dun, Martin REFERRING PROVIDER:  Dianne Dun, Martin   OT End of Session - 08/04/22 1217     Visit Number 9    Number of Visits 36    Date for OT Re-Evaluation 08/29/22    OT Start Time 0805    OT Stop Time 0910    OT Time Calculation (min) 65 min    Activity Tolerance Patient tolerated treatment well;No increased pain    Behavior During Therapy [facility] for tasks assessed/performed             Past Medical History:  Diagnosis Date   Asthma    Autoimmune Martin ([place])    Cancer ([place])    Endometrial   Corneal abrasion, right 05/24/2022   H/O blood clots    Hypertension    Lymphedema    right leg   Past Surgical History:  Procedure Laterality Date   BASAL CELL CARCINOMA EXCISION Left    CHOLECYSTECTOMY  2008   FLEXIBLE SIGMOIDOSCOPY N/A 05/19/2022   Procedure: FLEXIBLE SIGMOIDOSCOPY;  Surgeon: Elizabeth Martin;  Location: [facility] ENDOSCOPY;  Service: Gastroenterology;  Laterality: N/A;   HERNIA REPAIR  2004   PLANTAR FASCIECTOMY     ROBOTIC ASSISTED TOTAL HYSTERECTOMY WITH BILATERAL SALPINGO OOPHERECTOMY  02/14/2012   ROTATOR CUFF REPAIR Left 03/24/2022   TONSILLECTOMY     XI ROBOTIC ASSISTED LOWER ANTERIOR RESECTION N/A 05/24/2022   Procedure: XI ROBOTIC ASSISTED LOWER ANTERIOR RESECTION;  Surgeon: Elizabeth Martin;  Location: [facility] ORS;  Service: General;  Laterality: N/A;   Patient Active Problem List   Diagnosis Date Noted   Colonic stricture (Elizabeth Martin) 05/19/2022   Large bowel obstruction ([place]) 05/19/2022   Hypokalemia 05/18/2022   Abdominal pain 05/18/2022   Gastroesophageal reflux Martin 02/15/2020   Essential hypertension 08/09/2017   Asthma 06/04/1969    ONSET DATE: 05/31/22  REFERRING DIAG: I89.0 Lymphedema  THERAPY DIAG:  Lymphedema, not elsewhere classified  Rationale for Evaluation and  Treatment Rehabilitation  PERTINENT HISTORY: S/p Large bowel obstruction 2/2 colon stricture. 05/2022. Pt s/p ileostomy. Exacerbation of BLE lymphedema s/p xi robotic resection. DVT ruled out. 03/24/22 L Rotator cuff repair HTN Asthma Endometrial Ca Dx 02/2012. S/P total laparoscopic hysterectomy and LND ( 30 bilateral pelvic and periaortic LN). Adjuvant chemotherapy and XRT. S/p bilateral salpingo oophorectomy Hx VTEs  *Pt reports Autoimmune disorder- Elizabeth Martin,Elizabeth Martin    PRECAUTIONS: LE Precautions: Hx cellulitis, ileostomy  SUBJECTIVE: Elizabeth Martin presents to OT for BLE lymphedema care. Pt is unaccompanied today. Pt reports illiostomy reversal is scheduled for 9/19, and she will modify 1 x weekly schedule accordingly. Pt agreeable to completing initial RLE custom and device fitting. Garments arrived late last week.   PAIN:  Are you having pain? yes Pain location:L shoulder Pain description: sore, tender, post surgivcal Aggravating factors:: LE related pain: standing, walking, hot weather Relieving factors:compression, elevation, Flexitoouch   OBJECTIVE:   TODAY'S TREATMENT:  Initial fitting and assessment of  custom compression garment and HOS device.   PATIENT EDUCATION: Continued Pt/ CG edu for lymphedema self care home program throughout session. Topics include outcome of comparative limb volumetrics- starting limb volume differentials (LVDs), technology and gradient techniques used for short stretch, multilayer compression wrapping, simple self-MLD, therapeutic lymphatic pumping exercises, skin/nail care, LE precautions,. compression garment recommendations and specifications, wear and care schedule and compression garment donning / doffing w  assistive devices. Discussed progress towards all OT goals since commencing CDT. All questions answered to the Pt's satisfaction. Good return. Pt edu re scar tissue formation; kinesiotape for passive scar  massage and to promote lymphatic function Person educated: Patient Education method: Explanation, Demonstration, and Handouts Education comprehension: verbalized understanding, returned demonstration, and needs further education   HOME PROGRAM BLE lymphatic pumping there\x- 2 x daily, 2 sets of 10 , in order Flexitouch sequential pneumatic compression device 1 x daily 2 RLE Custom Elvarex classic , flat knit, thigh high ccl 2 ( 23-32 mmHg) 2 Pr custom compression "bike shorts"- ccl 1, ccl 1 (18-67mHg) RLE custom Jobst RELAX HOS device, ccl 2 thigh high     OT Long Term Goals - 06/10/22 1148       OT LONG TERM GOAL #1   Title Given this patient's Intake score of 49/100% on the functional outcomes FOTO tool, patient will experience an increase in function of 5 points to 54, to improve basic and instrumental ADLs performance, including lymphedema self-care.    Baseline 49    Time 12    Period Weeks    Status New    Target Date 08/28/22      OT LONG TERM GOAL #2   Title With mod assist  Pt will be able to apply multilayer, thigh length, compression wraps using gradient techniques from base of toes to groin  to decrease limb volume, to limit infection risk, and to limit lymphedema progression.    Baseline dependent    Time 4    Period Days    Status New    Target Date --   4th OT Rx visit     OT LONG TERM GOAL #3   Title Pt will achieve at least a 10% limb volume reduction in RLE, and 3% reduction on the L,  from ankle to groin, to return limb to more normal size and shape, to limit infection risk, to decrease pain, to improve AROM and function, and to limit lymphedema progression.    Baseline Max A    Time 12    Period Weeks    Status New    Target Date 08/28/22      OT LONG TERM GOAL #4   Title Pt will achieve and sustain no less than 85% compliance with all LE self-care home program components throughout CDT, including modified simple self-MLD, daily skin care and  inspection, lymphatic pumping the ex and appropriate compression to limit lymphedema progression and to limit further functional decline.    Baseline Max A    Time 12    Period Weeks    Status New    Target Date 08/28/22      OT LONG TERM GOAL #5   Title Pt will be able to don and doff appropriate daytime compression garments with Max caregiver assistance to limit re-accumulation of lymphatic congestion and to limit infection  risk and skin breakdown.    Baseline Max A    Time 12    Period Weeks    Status New    Target Date 08/28/22      Long Term Additional Goals   Additional Long Term Goals Yes      OT LONG TERM GOAL #6   Title Pt will be able to correctly perform all lymphedema self-care home program components using correct techniques with extra time and assistive devices PRN (modified independence), including simple self MLD, lymphatic pumping exercise, don and doff appropriate compression garments/ devices,  and perform daily skin care regime to manage chronic lymphedema over time and to limit infection risk.    Baseline Max A    Time 12    Period Weeks    Status New    Target Date 08/28/22                Plan -       Clinical Impression Statement Fitting Assessment and findings: Jobst RELAX A-G  Need Remake. Relax too short at top edge by several centimeters, despite using same measurements for garment at all landmarks. The A-G leg length is indeed 73, so no idea why this RELAX is too short. Checked anatomical measurements and they are A-OK.  2.- Elvarex Biker Shorts - Need Remake,  - K2  and K1 T too short-  garment fits like a low slung bikini being too short in front and back from crotch to waistline. See photos . Leg stubs too short and barely cover top edge of Elvarex compression stocking top band. Almost no overlap. Re checked both anatomical measurements and they are correct .  3. Elvarex A-G- too short in the front, but correct length in the back! Never seen THIS  before! I check ed my measurements and all ar A-OK  A-g Foot portion too short. I checked my measurements and changed A(medial) to 19 and A(lateral) length to 17 cm.  I'll attach photos below so Jobst can see all problems illustrated with tape measure against my measurements.  pT GAVE VERBAL PERMISSION TO PHOTOGRAPH GARMENT ISSUES AND SEND SECURELY TO GARMENT  VENDOR AND MANUFACTURER. wILL FIT  as soon as remakes arrive.   Will cont OT 1 x weekly until surgery performed on 9/19, then we'll reassess and modify POC PRN. Cont as per POC.     OT Occupational Profile and History Detailed Assessment- Review of Records and additional review of physical, cognitive, psychosocial history related to current functional performance     Occupational performance deficits (Please refer to evaluation for details): ADL's;IADL's;Rest and Sleep;Work;Leisure;Social Participation     Body Structure / Function / Physical Skills ADL;Edema;Skin integrity;Pain;Gait;ROM;Decreased knowledge of precautions;Decreased knowledge of use of DME;IADL     Rehab Potential Good     Clinical Decision Making Several treatment options, min-mod task modification necessary     Comorbidities Affecting Occupational Performance: Presence of comorbidities impacting occupational performance     Modification or Assistance to Complete Evaluation  Min-Moderate modification of tasks or assist with assess necessary to complete eval     OT Frequency 2x / week     OT Duration 12 weeks     OT Treatment/Interventions Self-care/ADL training;Therapeutic exercise;Manual Therapy;Coping strategies training;Therapeutic activities;Manual lymph drainage;DME and/or AE instruction;Compression bandaging;Other (comment);Scar mobilization;Patient/family education   skin care throughout MLD to limit infection risk    Plan BLE/BLQ CDT including MLD, skin care, compression wrapping, ther ex, fitting with appropriate compression garments/ devices when optimal reduction  achieved. Pt will benefit from advanced sequential ppneumatic compression device, Flexitouch, for long term self-managemnt of LE after Intensive Phase CDT compete. Set up clinical trial ASAP.     Consulted and Agree with Plan of Care Patient;Family member/caregiver        Andrey Spearman, MS, OTR/L, CLT-LANA 08/04/22 12:19 PM

## 2022-08-05 ENCOUNTER — Telehealth: Payer: Self-pay | Admitting: *Deleted

## 2022-08-05 NOTE — Telephone Encounter (Signed)
Faxed Patients Husband Elizabeth Martin) FMLA to [employer] at [phone]

## 2022-08-11 ENCOUNTER — Ambulatory Visit: Payer: BC Managed Care – PPO | Attending: Family Medicine | Admitting: Occupational Therapy

## 2022-08-11 ENCOUNTER — Ambulatory Visit (INDEPENDENT_AMBULATORY_CARE_PROVIDER_SITE_OTHER): Payer: BC Managed Care – PPO | Admitting: Surgery

## 2022-08-11 ENCOUNTER — Encounter: Payer: Self-pay | Admitting: Surgery

## 2022-08-11 VITALS — BP 129/80 | HR 97 | Temp 98.3°F | Ht 67.0 in | Wt 162.0 lb

## 2022-08-11 DIAGNOSIS — Z432 Encounter for attention to ileostomy: Secondary | ICD-10-CM | POA: Diagnosis not present

## 2022-08-11 DIAGNOSIS — I890 Lymphedema, not elsewhere classified: Secondary | ICD-10-CM | POA: Insufficient documentation

## 2022-08-11 DIAGNOSIS — Z932 Ileostomy status: Secondary | ICD-10-CM

## 2022-08-11 NOTE — Patient Instructions (Addendum)
We have spoken today about reversing your Ostomy. You are requesting to have this done.  We will arrange this to be done on 08/24/22 by Dr. Hampton Abbot at [facility].   Plan on being in the hospital between 2-5 days after surgery. You will be started on a liquid diet and then advanced as tolerated prior to going home.  If you have any disability or FMLA paperwork that needs to be filled out for your employer, please bring this in prior to surgery and it will be filled out upon your discharge from the hospital. We can give you a note anticipating your surgery date. If your employer is in need of this, please let us know.  To prep for your surgery, You will need to complete a bowel prep, 2 antibiotics, and a fleets enema prior to surgery. Your antibiotics are Neomycin and Metronidazole and these will be taken at 8am, 2pm, 8pm on the day of your prep. (Please see the Bowel sheet provided)  Please see your [facility]) Pre-Care Sheet for more information. If you have any questions, please call our office and ask for a nurse.  End Colostomy Reversal An end colostomy reversal is surgery that reverses an end colostomy. The large intestine is disconnected from the opening in the abdomen (stoma). Then it is reconnected to the large intestine inside the body. A stoma and pouch are no longer needed. Bowel movements can resume through the rectum. LET [facility] CARE PROVIDER KNOW ABOUT: Allergies to food or medicine. Medicines taken, including vitamins, health supplements, herbs, eye drops, over-the-counter medicines, and creams. Use of steroids (by mouth or creams). Previous problems with anesthetics or numbing medicines. History of bleeding problems or blood clots. Previous surgery. Other health problems, including diabetes and kidney problems. Possibility of pregnancy, if this applies. RISKS AND COMPLICATIONS General surgical complications may include the following: Reaction to anesthetics. Damage to surrounding  nerves, tissues, or structures. Blood clot. Bleeding. Scarring. Specific risks for colostomy reversal, while rare, may include: Intestinal paralysis (ileus). This is a normal part of recovery. It usually goes away in 3-7 days. However, it can last longer in some people. Leaking at the joined part of the intestine (anastomotic leak). Infection of the surgical cut (incision) or the place where the stoma was located. A collection of pus (abscess) in the abdomen or pelvis. Intestinal blockage. Narrowing at the joined part of the intestine (stricture). Urinary and sexual dysfunction. BEFORE THE PROCEDURE It is important to follow your health care provider's instructions prior to your procedure. This will help you to avoid complications. Steps before your procedure may include: A physical exam, rectal exam, X-rays, colonoscopy, and other procedures. Chemotherapy or radiation therapy, if the stoma was created due to cancer. A review of the procedure, the anesthetic being used, and what to expect after the procedure. You may be asked to: Stop taking certain medicines for several days prior to your procedure. These may include blood thinners (such as aspirin). Take certain medicines, such as antibiotics or stool softeners. Avoid eating and drinking after midnight the night before the procedure. This will help you to avoid complications from the anesthetic. Quit smoking. Smoking increases the chances of a healing problem after your procedure. PROCEDURE You will be given medicine that makes you sleep (general anesthetic). The procedure may be done as open surgery, with a large incision. It may also be done as laparoscopic surgery, with several smaller incisions. The surgeon will stitch or staple the intestine ends back together. This surgery  takes several hours. AFTER THE PROCEDURE You will be given pain medicine. Slowly increase your diet and movement as directed by your health care provider. You  should arrange for someone to help you with activities at home while you recover.   This information is not intended to replace advice given to you by your health care provider. Make sure you discuss any questions you have with your health care provider.   Document Released: 02/14/2012 Document Revised: 04/08/2015 Document Reviewed: 02/14/2012 Elsevier Interactive Patient Education [employer].

## 2022-08-11 NOTE — H&P (View-Only) (Signed)
08/11/2022  History of Present Illness: Elizabeth Martin is a 61 y.o. female presenting for H&P update for ileostomy reversal.  She is s/p robotic assisted sigmoidectomy with diverting loop ileostomy on 05/24/22.  She has been doing well since and today she reports that she's gotten her energy level back and she's walking more and feels a lot better compared to before her initial surgery.  She also had a contrast enema study which did not reveal any anastomotic stricture or leakage.  Past Medical History: Past Medical History:  Diagnosis Date   Asthma    Autoimmune disease ([place])    Cancer ([place])    Endometrial   Corneal abrasion, right 05/24/2022   H/O blood clots    Hypertension    Lymphedema    right leg     Past Surgical History: Past Surgical History:  Procedure Laterality Date   BASAL CELL CARCINOMA EXCISION Left    CHOLECYSTECTOMY  2008   FLEXIBLE SIGMOIDOSCOPY N/A 05/19/2022   Procedure: FLEXIBLE SIGMOIDOSCOPY;  Surgeon: Toledo, Benay Pike, MD;  Location: [facility] ENDOSCOPY;  Service: Gastroenterology;  Laterality: N/A;   HERNIA REPAIR  2004   PLANTAR FASCIECTOMY     ROBOTIC ASSISTED TOTAL HYSTERECTOMY WITH BILATERAL SALPINGO OOPHERECTOMY  02/14/2012   ROTATOR CUFF REPAIR Left 03/24/2022   TONSILLECTOMY     XI ROBOTIC ASSISTED LOWER ANTERIOR RESECTION N/A 05/24/2022   Procedure: XI ROBOTIC ASSISTED LOWER ANTERIOR RESECTION;  Surgeon: Olean Ree, MD;  Location: [facility] ORS;  Service: General;  Laterality: N/A;    Home Medications: Prior to Admission medications   Medication Sig Start Date End Date Taking? Authorizing Provider  albuterol (VENTOLIN HFA) 108 (90 Base) MCG/ACT inhaler Inhale 2 puffs into the lungs every 6 (six) hours as needed. 01/11/22  Yes [provider]  bisacodyl (DULCOLAX) 5 MG EC tablet Take all 4 tablets at 8 am the morning prior to your surgery. 07/28/22  Yes Vida Nicol, Elizabeth Bath, MD  celecoxib (CELEBREX) 100 MG capsule Take 100 mg by mouth 2 (two) times  daily. 04/28/22  Yes [provider]  cetirizine (ZYRTEC) 10 MG tablet Take 10 mg by mouth daily. 08/10/21  Yes [provider]  chlorthalidone (HYGROTON) 50 MG tablet Take 50 mg by mouth every morning. 11/08/21  Yes [provider]  clindamycin (CLEOCIN) 2 % vaginal cream [id number]:1.7-4 Applicator Vaginal Every Other Day 05/04/22  Yes [provider]  docusate sodium (COLACE) 100 MG capsule Take 100 mg by mouth 2 (two) times daily. 03/24/22  Yes [provider]  DULoxetine (CYMBALTA) 30 MG capsule Take 90 mg by mouth daily. 11/16/21  Yes [provider]  dutasteride (AVODART) 0.5 MG capsule Take 0.5 mg by mouth daily. 02/17/22  Yes [provider]  gabapentin (NEURONTIN) 100 MG capsule Take 100 mg by mouth at bedtime. 04/23/22  Yes [provider]  hydroxychloroquine (PLAQUENIL) 200 MG tablet Take 200 mg by mouth 2 (two) times daily. 01/11/22  Yes [provider]  ibuprofen (ADVIL) 600 MG tablet Take 1 tablet (600 mg total) by mouth every 6 (six) hours as needed. 05/27/22  Yes Edison Simon R, PA-C  lisinopril (ZESTRIL) 20 MG tablet Take 1 tablet by mouth daily. 02/15/17  Yes [provider]  metroNIDAZOLE (FLAGYL) 500 MG tablet Take by mouth. 05/04/22  Yes [provider]  metroNIDAZOLE (FLAGYL) 500 MG tablet Take 2 tablets at 8AM, take 2 tablets at [facility], and take 2 tablets at 8PM the day prior to your surgery 07/28/22  Yes Advika Mclelland,  Elizabeth Rossmann, MD  Multiple Vitamin (MULTIVITAMIN) capsule Take 1 capsule by mouth daily.   Yes [provider]  neomycin (MYCIFRADIN) 500 MG tablet Take 2 tablet at 8am, take 2 tablets at 2pm, and take 2 tablets at 8pm the day prior to your surgery 07/28/22  Yes Keela Rubert, MD  oxybutynin (DITROPAN) 5 MG tablet TAKE 1 TABLET BY MOUTH THREE TIMES A DAY 05/21/22  Yes Stoioff, Ronda Fairly, MD  pentoxifylline (TRENTAL) 400 MG CR tablet Take by mouth. 04/29/22  Yes [provider]   polyethylene glycol powder (MIRALAX) 17 GM/SCOOP powder Mix full container in 64 ounces of Gatorade or other clear liquid. NO Red liquids. 07/28/22  Yes Gean Larose, Elizabeth Bath, MD  prochlorperazine (COMPAZINE) 5 MG tablet Take 1 tablet (5 mg total) by mouth every 6 (six) hours as needed for nausea or vomiting. 05/27/22  Yes Tylene Fantasia, PA-C    Allergies: Allergies  Allergen Reactions   Carboplatin Anaphylaxis    ANAPHYLACTIC REACTION ANAPHYLACTIC REACTION    Nitrofurantoin Nausea Only and Nausea And Vomiting   Silicone Itching and Rash   Wheat Bran Other (See Comments)    Other reaction(s): Other (See Comments) Body aches and swelling Body aches and swelling    Fentanyl Hives, Itching and Rash    Review of Systems: Review of Systems  Constitutional:  Negative for chills and fever.  HENT:  Negative for hearing loss.   Respiratory:  Negative for shortness of breath.   Cardiovascular:  Negative for chest pain.  Gastrointestinal:  Negative for abdominal pain, nausea and vomiting.  Genitourinary:  Negative for dysuria.  Musculoskeletal:  Negative for myalgias.  Skin:  Negative for rash.  Neurological:  Negative for dizziness.  Psychiatric/Behavioral:  Negative for depression.     Physical Exam BP 129/80   Pulse 97   Temp 98.3 F (36.8 C)   Ht '5\' 7"'$  (1.702 m)   Wt 162 lb (73.5 kg)   SpO2 98%   BMI 25.37 kg/m  CONSTITUTIONAL: No acute distress, well nourished. HEENT:  Normocephalic, atraumatic, extraocular motion intact. NECK:  Trachea is midline, no jugular venous distention. RESPIRATORY:  Lungs are clear, and breath sounds are equal bilaterally. Normal respiratory effort without pathologic use of accessory muscles. CARDIOVASCULAR: Heart is regular without murmurs, gallops, or rubs. GI: The abdomen is soft, non-distended, non-tender to palpation.  Incisions are well healed.  RLQ loop ileostomy is pink/patent, without edema, with liquid stool in bag.  No parastomal hernia  palpable.  MUSCULOSKELETAL:  No peripheral edema, normal gait. NEUROLOGIC:  Motor and sensation is grossly normal.  Cranial nerves are grossly intact. PSYCH:  Alert and oriented to person, place and time. Affect is normal.  Labs/Imaging: Contrast enema on 07/27/22: IMPRESSION: Focused water-soluble contrast enema demonstrating no evidence of leak or stricture in the region of the colorectal anastomosis.   The colorectal anastomosis has an end-to-side appearance with a blind-ending pouch upstream from the anastomosis. Correlate with the operative history.    Assessment and Plan: This is a 61 y.o. female s/p robotic assisted sigmoidectomy with diverting loop ileostomy, now ready for reversal.  --Discussed with the patient the plan for an open ileostomy reversal.  Discussed that this should be able to do through the ostomy incision itself, hopefully not requiring another incision depending on the amount of scar tissue in the area.  Discussed the use of a stapler to create a new anastomosis with the small intestine, and the subsequent hospital stay to advance her diet and monitor  her progress.  She's in agreement.  Reviewed the surgery at length with her including risks of bleeding, infection, injury to surrounding structures, post-op activity restrictions, hospital stay, pain control, and she's willing to proceed. --Patient is scheduled for surgery on 08/24/22.  I spent 40 minutes dedicated to the care of this patient on the date of this encounter to include pre-visit review of records, face-to-face time with the patient discussing diagnosis and management, and any post-visit coordination of care.   Melvyn Neth, [facility]

## 2022-08-11 NOTE — Progress Notes (Signed)
08/11/2022  History of Present Illness: Elizabeth Martin is a 61 y.o. female presenting for H&P update for ileostomy reversal.  She is s/p robotic assisted sigmoidectomy with diverting loop ileostomy on 05/24/22.  She has been doing well since and today she reports that she's gotten her energy level back and she's walking more and feels a lot better compared to before her initial surgery.  She also had a contrast enema study which did not reveal any anastomotic stricture or leakage.  Past Medical History: Past Medical History:  Diagnosis Date   Asthma    Autoimmune disease ([place])    Cancer ([place])    Endometrial   Corneal abrasion, right 05/24/2022   H/O blood clots    Hypertension    Lymphedema    right leg     Past Surgical History: Past Surgical History:  Procedure Laterality Date   BASAL CELL CARCINOMA EXCISION Left    CHOLECYSTECTOMY  2008   FLEXIBLE SIGMOIDOSCOPY N/A 05/19/2022   Procedure: FLEXIBLE SIGMOIDOSCOPY;  Surgeon: Toledo, Benay Pike, MD;  Location: [facility] ENDOSCOPY;  Service: Gastroenterology;  Laterality: N/A;   HERNIA REPAIR  2004   PLANTAR FASCIECTOMY     ROBOTIC ASSISTED TOTAL HYSTERECTOMY WITH BILATERAL SALPINGO OOPHERECTOMY  02/14/2012   ROTATOR CUFF REPAIR Left 03/24/2022   TONSILLECTOMY     XI ROBOTIC ASSISTED LOWER ANTERIOR RESECTION N/A 05/24/2022   Procedure: XI ROBOTIC ASSISTED LOWER ANTERIOR RESECTION;  Surgeon: Olean Ree, MD;  Location: [facility] ORS;  Service: General;  Laterality: N/A;    Home Medications: Prior to Admission medications   Medication Sig Start Date End Date Taking? Authorizing Provider  albuterol (VENTOLIN HFA) 108 (90 Base) MCG/ACT inhaler Inhale 2 puffs into the lungs every 6 (six) hours as needed. 01/11/22  Yes [provider]  bisacodyl (DULCOLAX) 5 MG EC tablet Take all 4 tablets at 8 am the morning prior to your surgery. 07/28/22  Yes Welma Mccombs, Jacqulyn Bath, MD  celecoxib (CELEBREX) 100 MG capsule Take 100 mg by mouth 2 (two) times  daily. 04/28/22  Yes [provider]  cetirizine (ZYRTEC) 10 MG tablet Take 10 mg by mouth daily. 08/10/21  Yes [provider]  chlorthalidone (HYGROTON) 50 MG tablet Take 50 mg by mouth every morning. 11/08/21  Yes [provider]  clindamycin (CLEOCIN) 2 % vaginal cream [id number]:9.3-7 Applicator Vaginal Every Other Day 05/04/22  Yes [provider]  docusate sodium (COLACE) 100 MG capsule Take 100 mg by mouth 2 (two) times daily. 03/24/22  Yes [provider]  DULoxetine (CYMBALTA) 30 MG capsule Take 90 mg by mouth daily. 11/16/21  Yes [provider]  dutasteride (AVODART) 0.5 MG capsule Take 0.5 mg by mouth daily. 02/17/22  Yes [provider]  gabapentin (NEURONTIN) 100 MG capsule Take 100 mg by mouth at bedtime. 04/23/22  Yes [provider]  hydroxychloroquine (PLAQUENIL) 200 MG tablet Take 200 mg by mouth 2 (two) times daily. 01/11/22  Yes [provider]  ibuprofen (ADVIL) 600 MG tablet Take 1 tablet (600 mg total) by mouth every 6 (six) hours as needed. 05/27/22  Yes Edison Simon R, PA-C  lisinopril (ZESTRIL) 20 MG tablet Take 1 tablet by mouth daily. 02/15/17  Yes [provider]  metroNIDAZOLE (FLAGYL) 500 MG tablet Take by mouth. 05/04/22  Yes [provider]  metroNIDAZOLE (FLAGYL) 500 MG tablet Take 2 tablets at 8AM, take 2 tablets at [facility], and take 2 tablets at 8PM the day prior to your surgery 07/28/22  Yes Knox Holdman,  Davine Sweney, MD  Multiple Vitamin (MULTIVITAMIN) capsule Take 1 capsule by mouth daily.   Yes [provider]  neomycin (MYCIFRADIN) 500 MG tablet Take 2 tablet at 8am, take 2 tablets at 2pm, and take 2 tablets at 8pm the day prior to your surgery 07/28/22  Yes Rosemae Mcquown, MD  oxybutynin (DITROPAN) 5 MG tablet TAKE 1 TABLET BY MOUTH THREE TIMES A DAY 05/21/22  Yes Stoioff, Ronda Fairly, MD  pentoxifylline (TRENTAL) 400 MG CR tablet Take by mouth. 04/29/22  Yes [provider]   polyethylene glycol powder (MIRALAX) 17 GM/SCOOP powder Mix full container in 64 ounces of Gatorade or other clear liquid. NO Red liquids. 07/28/22  Yes Izella Ybanez, Jacqulyn Bath, MD  prochlorperazine (COMPAZINE) 5 MG tablet Take 1 tablet (5 mg total) by mouth every 6 (six) hours as needed for nausea or vomiting. 05/27/22  Yes Tylene Fantasia, PA-C    Allergies: Allergies  Allergen Reactions   Carboplatin Anaphylaxis    ANAPHYLACTIC REACTION ANAPHYLACTIC REACTION    Nitrofurantoin Nausea Only and Nausea And Vomiting   Silicone Itching and Rash   Wheat Bran Other (See Comments)    Other reaction(s): Other (See Comments) Body aches and swelling Body aches and swelling    Fentanyl Hives, Itching and Rash    Review of Systems: Review of Systems  Constitutional:  Negative for chills and fever.  HENT:  Negative for hearing loss.   Respiratory:  Negative for shortness of breath.   Cardiovascular:  Negative for chest pain.  Gastrointestinal:  Negative for abdominal pain, nausea and vomiting.  Genitourinary:  Negative for dysuria.  Musculoskeletal:  Negative for myalgias.  Skin:  Negative for rash.  Neurological:  Negative for dizziness.  Psychiatric/Behavioral:  Negative for depression.     Physical Exam BP 129/80   Pulse 97   Temp 98.3 F (36.8 C)   Ht '5\' 7"'$  (1.702 m)   Wt 162 lb (73.5 kg)   SpO2 98%   BMI 25.37 kg/m  CONSTITUTIONAL: No acute distress, well nourished. HEENT:  Normocephalic, atraumatic, extraocular motion intact. NECK:  Trachea is midline, no jugular venous distention. RESPIRATORY:  Lungs are clear, and breath sounds are equal bilaterally. Normal respiratory effort without pathologic use of accessory muscles. CARDIOVASCULAR: Heart is regular without murmurs, gallops, or rubs. GI: The abdomen is soft, non-distended, non-tender to palpation.  Incisions are well healed.  RLQ loop ileostomy is pink/patent, without edema, with liquid stool in bag.  No parastomal hernia  palpable.  MUSCULOSKELETAL:  No peripheral edema, normal gait. NEUROLOGIC:  Motor and sensation is grossly normal.  Cranial nerves are grossly intact. PSYCH:  Alert and oriented to person, place and time. Affect is normal.  Labs/Imaging: Contrast enema on 07/27/22: IMPRESSION: Focused water-soluble contrast enema demonstrating no evidence of leak or stricture in the region of the colorectal anastomosis.   The colorectal anastomosis has an end-to-side appearance with a blind-ending pouch upstream from the anastomosis. Correlate with the operative history.    Assessment and Plan: This is a 61 y.o. female s/p robotic assisted sigmoidectomy with diverting loop ileostomy, now ready for reversal.  --Discussed with the patient the plan for an open ileostomy reversal.  Discussed that this should be able to do through the ostomy incision itself, hopefully not requiring another incision depending on the amount of scar tissue in the area.  Discussed the use of a stapler to create a new anastomosis with the small intestine, and the subsequent hospital stay to advance her diet and monitor  her progress.  She's in agreement.  Reviewed the surgery at length with her including risks of bleeding, infection, injury to surrounding structures, post-op activity restrictions, hospital stay, pain control, and she's willing to proceed. --Patient is scheduled for surgery on 08/24/22.  I spent 40 minutes dedicated to the care of this patient on the date of this encounter to include pre-visit review of records, face-to-face time with the patient discussing diagnosis and management, and any post-visit coordination of care.   Melvyn Neth, [facility]

## 2022-08-11 NOTE — Therapy (Signed)
OUTPATIENT OCCUPATIONAL THERAPY TREATMENT NOTE   Patient Name: Elizabeth Martin MRN: [id number] DOB:1961-07-07, 61 y.o., female Today's Date: 08/11/2022  PCP: Dianne Dun, MD REFERRING PROVIDER:  Dianne Dun, MD   OT End of Session - 08/11/22 1114     Visit Number 10    Number of Visits 36    Date for OT Re-Evaluation 08/29/22    OT Start Time 1105    OT Stop Time 1210    OT Time Calculation (min) 65 min    Activity Tolerance Patient tolerated treatment well;No increased pain    Behavior During Therapy [facility] for tasks assessed/performed             Past Medical History:  Diagnosis Date   Asthma    Autoimmune disease ([place])    Cancer ([place])    Endometrial   Corneal abrasion, right 05/24/2022   H/O blood clots    Hypertension    Lymphedema    right leg   Past Surgical History:  Procedure Laterality Date   BASAL CELL CARCINOMA EXCISION Left    CHOLECYSTECTOMY  2008   FLEXIBLE SIGMOIDOSCOPY N/A 05/19/2022   Procedure: FLEXIBLE SIGMOIDOSCOPY;  Surgeon: Toledo, Benay Pike, MD;  Location: [facility] ENDOSCOPY;  Service: Gastroenterology;  Laterality: N/A;   HERNIA REPAIR  2004   PLANTAR FASCIECTOMY     ROBOTIC ASSISTED TOTAL HYSTERECTOMY WITH BILATERAL SALPINGO OOPHERECTOMY  02/14/2012   ROTATOR CUFF REPAIR Left 03/24/2022   TONSILLECTOMY     XI ROBOTIC ASSISTED LOWER ANTERIOR RESECTION N/A 05/24/2022   Procedure: XI ROBOTIC ASSISTED LOWER ANTERIOR RESECTION;  Surgeon: Olean Ree, MD;  Location: [facility] ORS;  Service: General;  Laterality: N/A;   Patient Active Problem List   Diagnosis Date Noted   Colonic stricture ([place]) 05/19/2022   Large bowel obstruction ([place]) 05/19/2022   Hypokalemia 05/18/2022   Abdominal pain 05/18/2022   Gastroesophageal reflux disease 02/15/2020   Essential hypertension 08/09/2017   Asthma 06/04/1969    ONSET DATE: 05/31/22  REFERRING DIAG: I89.0 Lymphedema  THERAPY DIAG:  Lymphedema, not elsewhere classified  Rationale for Evaluation and  Treatment Rehabilitation  PERTINENT HISTORY: S/p Large bowel obstruction 2/2 colon stricture. 05/2022. Pt s/p ileostomy. Exacerbation of BLE lymphedema s/p xi robotic resection. DVT ruled out. 03/24/22 L Rotator cuff repair HTN Asthma Endometrial Ca Dx 02/2012. S/P total laparoscopic hysterectomy and LND ( 30 bilateral pelvic and periaortic LN). Adjuvant chemotherapy and XRT. S/p bilateral salpingo oophorectomy Hx VTEs  *Pt reports Autoimmune disorder- Hashimoto's disease,Connective Tissue Disease    PRECAUTIONS: LE Precautions: Hx cellulitis, ileostomy  SUBJECTIVE: Elizabeth Martin presents to OT for BLE lymphedema care. Pt is unaccompanied today. Pt reports illiostomy reversal is scheduled for 9/19, and she will modify 1 x weekly schedule accordingly. Custom compression garment remakes are in progress.    PAIN:  Are you having pain? no Pain location: n/a Pain description: n/a Aggravating factors:: n/a Relieving factors:compression, elevation, Flexitoouch   OBJECTIVE:    BLE COMPARATIVE LIMB VOLUMETRICS  LANDMARK RIGHT  (RX, Dominant) 06/01/22 RIGHT  (RX, Dominant)  10th visit 08/11/22  R LEG (A-D) 4324.9 ml 3420.0 ml  R THIGH (E-G) 7505.8 ml 6653.5 ml  R FULL LIMB (A-G) 11830.7 ml 10078.6 ml  Limb Volume differential (LVD)  A-D LVD = 28.07 %, R>L, E-G LVD= 18.83%, R>L, A-G LVD = 22.21%, R>L   Volume change since initial N/A % A-D DEC 20.8%; E-G DEC 11.36%, A-E DEC 14.5%  Volume change overall V   (Blank rows = not tested)  LANDMARK LEFT  06/01/22 LEFT 08/11/22  R LEG (A-D) 3110.6 ml   R THIGH (E-G) 6082.1 ml   R FULL LIMB (A-G) 9202.7 ml   Limb Volume differential (LVD)  %   Volume change since initial %   Volume change overall %   (Blank rows = not tested)    TODAY'S TREATMENT:  RLE comparative limb volumetrics. Limb volume reduction goal met and exceeded for both leg and thigh and for full limb with ankle-knee reduction measuring 20.8% , limb volume thigh (E-G) reduction  measuring 11.36% , and full limb reduction (A-G ,ankle to groin) measuring 14.5%. Skin of the RLE is well hydrated and mobile. 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    PATIENT EDUCATION: Reviewed progress towards all OT goals since commencing CDT. All questions answered to the Pt's satisfaction. Good return. Pt edu re scar tissue formation; kinesiotape for passive scar massage and to promote lymphatic function Person educated: Patient Education method: Explanation, Demonstration, and Handouts Education comprehension: verbalized understanding, returned demonstration, and needs further education   HOME PROGRAM BLE lymphatic pumping there\x- 2 x daily, 2 sets of 10 , in order Flexitouch sequential pneumatic compression device 1 x daily 2 RLE Custom Elvarex classic , flat knit, thigh high ccl 2 ( 23-32 mmHg) 2 Pr custom compression "bike shorts"- ccl 1, ccl 1 (18-55mHg) RLE custom [employer] RELAX HOS device, ccl 2 thigh high     OT Long Term Goals - 06/10/22 1148       OT LONG TERM GOAL #1   Title Given this patient's Intake score of 49/100% on the functional outcomes FOTO tool, patient will experience an increase in function of 5 points to 54, to improve basic and instrumental ADLs performance, including lymphedema self-care.    Baseline 49    Time 12    Period Weeks    Status ONGOING   Target Date 08/28/22      OT LONG TERM GOAL #2   Title With mod assist  Pt will be able to apply multilayer, thigh length, compression wraps using gradient techniques from base of toes to groin  to decrease limb volume, to limit infection risk, and to limit lymphedema progression.    Baseline dependent    Time 4    Period Days    Status GOAL MET   Target Date --   4th OT Rx visit     OT LONG TERM GOAL #3   Title Pt will achieve at least a 10% limb volume reduction in RLE, and 3% reduction on  the L,  from ankle to groin, to return limb to more normal size and shape, to limit infection risk, to decrease pain, to improve AROM and function, and to limit lymphedema progression.    Baseline Max A    Time 12    Period Weeks    Status GOAL MET and exceeded  9/6//23 with A-D DEC 20.8%, E-G DEC 11.36% and A-G DEC 14.5^% since 06/01/22   Target Date 08/28/22      OT LONG TERM GOAL #4   Title Pt will achieve and sustain no less than 85% compliance with all LE self-care home program components throughout CDT, including modified simple self-MLD, daily skin care and inspection, lymphatic pumping the ex and appropriate compression to limit lymphedema progression and to limit further functional decline.    Baseline Max A    Time 12  Period Weeks    Status GOAL MET   Target Date 08/28/22      OT LONG TERM GOAL #5   Title Pt will be able to don and doff appropriate daytime compression garments with Max caregiver assistance to limit re-accumulation of lymphatic congestion and to limit infection  risk and skin breakdown.    Baseline Max A    Time 12    Period Weeks    Status GOAL MET   Target Date 08/28/22      Long Term Additional Goals   Additional Long Term Goals Yes      OT LONG TERM GOAL #6   Title Pt will be able to correctly perform all lymphedema self-care home program components using correct techniques with extra time and assistive devices PRN (modified independence), including simple self MLD, lymphatic pumping exercise, don and doff appropriate compression garments/ devices, and perform daily skin care regime to manage chronic lymphedema over time and to limit infection risk.    Baseline Max A    Time 12    Period Weeks    Status GOAL MET   Target Date 08/28/22                Plan -       Clinical Impression Statement  Pt demonstrates excellent progress towards all OT lymphedema care goals. Please refer to LONG TERM GOALS section above for details,   Carrying forward  initial fitting fitting assessment and findings determined last visit to ensure continuity of care.  Jobst RELAX A-G  Need Remake. Relax too short at top edge by several centimeters, despite using same measurements for garment at all landmarks. The A-G leg length is indeed 73, so no idea why this RELAX is too short. Checked anatomical measurements and they are A-OK. 2.- Elvarex Biker Shorts - Need Remake,  - K2  and K1 T too short-  garment fits like a low slung bikini being too short in front and back from crotch to waistline. See photos . Leg stubs too short and barely cover top edge of Elvarex compression stocking top band. Almost no overlap. Re checked both anatomical measurements and they are correct . 3. Elvarex A-G- too short in the front, but correct length in the back! Never seen THIS before! I check ed my measurements and all ar A-OK  4. A-g Foot portion too short. I checked my measurements and changed A(medial) to 19 and A(lateral) length to 17 cm.  I'll attach photos below so Jobst can see all problems illustrated with tape measure against my measurements. 5. PT GAVE VERBAL PERMISSION TO PHOTOGRAPH ARMENT ISSUES AND SEND SECURELY TO GARMENT  VENDOR AND MANUFACTURER. wILL FIT  as soon as remakes arrive.   Plan. Fit remakes of custom compression garments as soon as they are available from vendor.  Will cont OT 1 x weekly until surgery performed on 9/19, then we'll reassess and modify POC PRN. Cont as per POC.       OT Occupational Profile and History Detailed Assessment- Review of Records and additional review of physical, cognitive, psychosocial history related to current functional performance     Occupational performance deficits (Please refer to evaluation for details): ADL's;IADL's;Rest and Sleep;Work;Leisure;Social Participation     Body Structure / Function / Physical Skills ADL;Edema;Skin integrity;Pain;Gait;ROM;Decreased knowledge of precautions;Decreased knowledge of use of DME;IADL      Rehab Potential Good     Clinical Decision Making Several treatment options, min-mod task modification necessary  Comorbidities Affecting Occupational Performance: Presence of comorbidities impacting occupational performance     Modification or Assistance to Complete Evaluation  Min-Moderate modification of tasks or assist with assess necessary to complete eval     OT Frequency 2x / week     OT Duration 12 weeks     OT Treatment/Interventions Self-care/ADL training;Therapeutic exercise;Manual Therapy;Coping strategies training;Therapeutic activities;Manual lymph drainage;DME and/or AE instruction;Compression bandaging;Other (comment);Scar mobilization;Patient/family education   skin care throughout MLD to limit infection risk    Plan BLE/BLQ CDT including MLD, skin care, compression wrapping, ther ex, fitting with appropriate compression garments/ devices when optimal reduction achieved. Pt will benefit from advanced sequential ppneumatic compression device, Flexitouch, for long term self-managemnt of LE after Intensive Phase CDT compete. Set up clinical trial ASAP.     Consulted and Agree with Plan of Care Patient;Family member/caregiver       Andrey Spearman, MS, OTR/L, CLT-LANA 08/11/22 1:12 PM

## 2022-08-16 ENCOUNTER — Encounter
Admission: RE | Admit: 2022-08-16 | Discharge: 2022-08-16 | Disposition: A | Discharge status: Home / Self Care | Payer: BC Managed Care – PPO | Source: Ambulatory Visit | Attending: Surgery | Admitting: Surgery

## 2022-08-16 HISTORY — DX: Nausea with vomiting, unspecified: R11.2

## 2022-08-16 HISTORY — DX: Personal history of other diseases of the digestive system: Z87.19

## 2022-08-16 HISTORY — DX: Gastro-esophageal reflux disease without esophagitis: K21.9

## 2022-08-16 HISTORY — DX: Localized scleroderma (morphea): L94.0

## 2022-08-16 HISTORY — DX: Nausea with vomiting, unspecified: Z98.890

## 2022-08-16 NOTE — Patient Instructions (Addendum)
Your procedure is scheduled on: 08/24/22 Report to [facility]. To find out your arrival time please call [phone] between 1PM - 3PM on 08/23/22.  Remember: Instructions that are not followed completely may result in serious medical risk, up to and including death, or upon the discretion of your surgeon and anesthesiologist your surgery may need to be rescheduled.     _X__ 1. PRE SURGERY DIET AS INSTRUCTED BY DR Hampton Abbot  __X__2.  On the morning of surgery brush your teeth with toothpaste and water, you                 may rinse your mouth with mouthwash if you wish.  Do not swallow any              toothpaste of mouthwash.     _X__ 3.  No Alcohol for 24 hours before or after surgery.   _X__ 4.  Do Not Smoke or use e-cigarettes For 24 Hours Prior to Your Surgery.                 Do not use any chewable tobacco products for at least 6 hours prior to                 surgery.  ____  5.  Bring all medications with you on the day of surgery if instructed.   __X__  6.  Notify your doctor if there is any change in your medical condition      (cold, fever, infections).     Do not wear jewelry, make-up, hairpins, clips or nail polish. Do not wear lotions, powders, or perfumes.  Do not shave BODY HAIR 48 hours prior to surgery. Men may shave face and neck. Do not bring valuables to the hospital.    [facility] is not responsible for any belongings or valuables.  Contacts, dentures/partials or body piercings may not be worn into surgery. Bring a case for your contacts, glasses or hearing aids, a denture cup will be supplied. Leave your suitcase in the car. After surgery it may be brought to your room. For patients admitted to the hospital, discharge time is determined by your treatment team.   Patients discharged the day of surgery will not be allowed to drive home.    __X__ Take these medicines the morning of surgery with A SIP OF  WATER:    1. DULoxetine (CYMBALTA) 30 MG capsule  2. dutasteride (AVODART) 0.5 MG capsule  3. MEDICATIONS AS INSTRUCTED BY DR PISCOYA  4.  5.  6.  ____ Fleet Enema (as directed)   ____ Use CHG Soap/SAGE wipes as directed  __X__ Use inhalers on the day of surgery  ____ Stop metformin/Janumet/Farxiga 2 days prior to surgery    ____ Take 1/2 of usual insulin dose the night before surgery. No insulin the morning          of surgery.   ____ Stop Blood Thinners Coumadin/Plavix/Xarelto/Pleta/Pradaxa/Eliquis/Effient/Aspirin  on   Or contact your Surgeon, Cardiologist or Medical Doctor regarding  ability to stop your blood thinners  __X__ Stop Anti-inflammatories 7 days before surgery such as Advil, Ibuprofen, Motrin,  BC or Goodies Powder, Naprosyn, Naproxen, Aleve, Aspirin    __X__ Stop all herbals and supplements, fish oil or vitamins for 1 week until after surgery.    ____ Bring C-Pap to the hospital.

## 2022-08-18 ENCOUNTER — Ambulatory Visit: Payer: BC Managed Care – PPO | Admitting: Occupational Therapy

## 2022-08-18 DIAGNOSIS — I890 Lymphedema, not elsewhere classified: Secondary | ICD-10-CM

## 2022-08-18 NOTE — Therapy (Signed)
OUTPATIENT OCCUPATIONAL THERAPY TREATMENT NOTE   Patient Name: Elizabeth Martin MRN: [id number] DOB:Sep 19, 1961, 61 y.o., female Today's Date: 08/18/2022  PCP: Dianne Dun, MD REFERRING PROVIDER:  Dianne Dun, MD   OT End of Session - 08/18/22 1455     Visit Number 11    Number of Visits 36    Date for OT Re-Evaluation 08/29/22    OT Start Time 0819    OT Stop Time 0915    OT Time Calculation (min) 56 min    Activity Tolerance Patient tolerated treatment well;No increased pain    Behavior During Therapy WFL for tasks assessed/performed             Past Medical History:  Diagnosis Date   Asthma    Autoimmune disease ([place])    Cancer ([place])    Endometrial lymph node removal (30)   Corneal abrasion, right 05/24/2022   GERD (gastroesophageal reflux disease)    H/O blood clots    upper rt groin and behind both knees   History of hiatal hernia    Hypertension    Lymphedema    right leg   Morphea scleroderma    PONV (postoperative nausea and vomiting)    states gets violently ill   Past Surgical History:  Procedure Laterality Date   BASAL CELL CARCINOMA EXCISION Left    CHOLECYSTECTOMY  2008   FLEXIBLE SIGMOIDOSCOPY N/A 05/19/2022   Procedure: FLEXIBLE SIGMOIDOSCOPY;  Surgeon: Toledo, Benay Pike, MD;  Location: [facility] ENDOSCOPY;  Service: Gastroenterology;  Laterality: N/A;   HERNIA REPAIR  2004   PLANTAR FASCIECTOMY     ROBOTIC ASSISTED TOTAL HYSTERECTOMY WITH BILATERAL SALPINGO OOPHERECTOMY  02/14/2012   ROTATOR CUFF REPAIR Left 03/24/2022   TONSILLECTOMY     XI ROBOTIC ASSISTED LOWER ANTERIOR RESECTION N/A 05/24/2022   Procedure: XI ROBOTIC ASSISTED LOWER ANTERIOR RESECTION;  Surgeon: Olean Ree, MD;  Location: [facility] ORS;  Service: General;  Laterality: N/A;   Patient Active Problem List   Diagnosis Date Noted   Colonic stricture ([place]) 05/19/2022   Large bowel obstruction ([place]) 05/19/2022   Hypokalemia 05/18/2022   Abdominal pain 05/18/2022   Gastroesophageal  reflux disease 02/15/2020   Essential hypertension 08/09/2017   Asthma 06/04/1969    ONSET DATE: 05/31/22  REFERRING DIAG: I89.0 Lymphedema  THERAPY DIAG:  Lymphedema, not elsewhere classified  Rationale for Evaluation and Treatment Rehabilitation  PERTINENT HISTORY: S/p Large bowel obstruction 2/2 colon stricture. 05/2022. Pt s/p ileostomy. Exacerbation of BLE lymphedema s/p xi robotic resection. DVT ruled out. 03/24/22 L Rotator cuff repair HTN Asthma Endometrial Ca Dx 02/2012. S/P total laparoscopic hysterectomy and LND ( 30 bilateral pelvic and periaortic LN). Adjuvant chemotherapy and XRT. S/p bilateral salpingo oophorectomy Hx VTEs  *Pt reports Autoimmune disorder- Hashimoto's disease,Connective Tissue Disease    PRECAUTIONS: LE Precautions: Hx cellulitis, ileostomy  SUBJECTIVE: Jamaiya Tunnell presents to OT for BLE lymphedema care. Pt is unaccompanied today. Pt reports illiostomy reversal is scheduled for 9/19, and she will modify 1 x weekly schedule accordingly. Custom compression garment remakes are in progress. Pt has no new complaints today.   PAIN:  Are you having pain? no Pain location: n/a Pain description: n/a Aggravating factors:: n/a Relieving factors:compression, elevation, Flexitoouch   OBJECTIVE:    BLE COMPARATIVE LIMB VOLUMETRICS  LANDMARK RIGHT  (RX, Dominant) 06/01/22 RIGHT  (RX, Dominant)  10th visit 08/11/22  R LEG (A-D) 4324.9 ml 3420.0 ml  R THIGH (E-G) 7505.8 ml 6653.5 ml  R FULL LIMB (A-G) 11830.7 ml 10078.6 ml  Limb Volume differential (LVD)  A-D LVD = 28.07 %, R>L, E-G LVD= 18.83%, R>L, A-G LVD = 22.21%, R>L   Volume change since initial N/A % A-D DEC 20.8%; E-G DEC 11.36%, A-E DEC 14.5%  Volume change overall V   (Blank rows = not tested)  LANDMARK LEFT  06/01/22 LEFT 08/11/22  R LEG (A-D) 3110.6 ml   R THIGH (E-G) 6082.1 ml   R FULL LIMB (A-G) 9202.7 ml   Limb Volume differential (LVD)  %   Volume change since initial %   Volume change  overall %   (Blank rows = not tested)    TODAY'S TREATMENT:  MLD in supine and side lying utilizing ipsilateral IA anastomosis. Scleroderma remains indurated and difficult to mobilize with scar massage and fibrosis techniques.  PATIENT EDUCATION: Reviewed progress towards all OT goals since commencing CDT. All questions answered to the Pt's satisfaction. Good return. Pt edu re scar tissue formation; kinesiotape for passive scar massage and to promote lymphatic function Person educated: Patient Education method: Explanation, Demonstration, and Handouts Education comprehension: verbalized understanding, returned demonstration, and needs further education   HOME PROGRAM BLE lymphatic pumping there\x- 2 x daily, 2 sets of 10 , in order Flexitouch sequential pneumatic compression device 1 x daily 2 RLE Custom Elvarex classic , flat knit, thigh high ccl 2 ( 23-32 mmHg) 2 Pr custom compression "bike shorts"- ccl 1, ccl 1 (18-26mHg) RLE custom [employer] RELAX HOS device, ccl 2 thigh high     OT Long Term Goals - 06/10/22 1148       OT LONG TERM GOAL #1   Title Given this patient's Intake score of 49/100% on the functional outcomes FOTO tool, patient will experience an increase in function of 5 points to 54, to improve basic and instrumental ADLs performance, including lymphedema self-care.    Baseline 49    Time 12    Period Weeks    Status ONGOING   Target Date 08/28/22      OT LONG TERM GOAL #2   Title With mod assist  Pt will be able to apply multilayer, thigh length, compression wraps using gradient techniques from base of toes to groin  to decrease limb volume, to limit infection risk, and to limit lymphedema progression.    Baseline dependent    Time 4    Period Days    Status GOAL MET   Target Date --   4th OT Rx visit     OT LONG TERM GOAL #3   Title Pt will achieve at least a 10% limb volume reduction in RLE, and 3% reduction on the L,  from ankle to groin, to return limb to  more normal size and shape, to limit infection risk, to decrease pain, to improve AROM and function, and to limit lymphedema progression.    Baseline Max A    Time 12    Period Weeks    Status GOAL MET and exceeded  9/6//23 with A-D DEC 20.8%, E-G DEC 11.36% and A-G DEC 14.5^% since 06/01/22   Target Date 08/28/22      OT LONG TERM GOAL #4   Title Pt will achieve and sustain no less than 85% compliance with all LE self-care home program components throughout CDT, including modified simple self-MLD, daily skin care and inspection, lymphatic pumping the ex and appropriate compression to limit lymphedema progression and to limit further functional decline.    Baseline Max A    Time 12    Period  Weeks    Status GOAL MET   Target Date 08/28/22      OT LONG TERM GOAL #5   Title Pt will be able to don and doff appropriate daytime compression garments with Max caregiver assistance to limit re-accumulation of lymphatic congestion and to limit infection  risk and skin breakdown.    Baseline Max A    Time 12    Period Weeks    Status GOAL MET   Target Date 08/28/22      Long Term Additional Goals   Additional Long Term Goals Yes      OT LONG TERM GOAL #6   Title Pt will be able to correctly perform all lymphedema self-care home program components using correct techniques with extra time and assistive devices PRN (modified independence), including simple self MLD, lymphatic pumping exercise, don and doff appropriate compression garments/ devices, and perform daily skin care regime to manage chronic lymphedema over time and to limit infection risk.    Baseline Max A    Time 12    Period Weeks    Status GOAL MET   Target Date 08/28/22                Plan -       Clinical Impression Statement  Pt tolerated manual therapy, MLD and scar massage to morphea,  today without increased pain. Applied kinesiotape anchoring 2" wide strips along bilateral IA  and extending 4 finger fan cut distally  dense morphea in effort to encourage improved lymphatic function and to soften dense scar tissue and ensure skin excursion essential for optimal lymphatic function.  Complete custom compression garment remake fitting after remakes arrive. Issues addressing with remakes include:   [employer]st RELAX A-G  Need Remake. Relax too short at top edge by several centimeters, despite using same measurements for garment at all landmarks. The A-G leg length is indeed 73, so no idea why this RELAX is too short. Checked anatomical measurements and they are A-OK. 2.- Elvarex Biker Shorts - Need Remake,  - K2  and K1 T too short-  garment fits like a low slung bikini being too short in front and back from crotch to waistline. See photos . Leg stubs too short and barely cover top edge of Elvarex compression stocking top band. Almost no overlap. Re checked both anatomical measurements and they are correct . 3. Elvarex A-G- too short in the front, but correct length in the back! Never seen THIS before! I check ed my measurements and all ar A-OK  4. A-g Foot portion too short. I checked my measurements and changed A(medial) to 19 and A(lateral) length to 17 cm.  I'll attach photos below so Jobst can see all problems illustrated with tape measure against my measurements. 5. PT GAVE VERBAL PERMISSION TO PHOTOGRAPH ARMENT ISSUES AND SEND SECURELY TO GARMENT  VENDOR AND MANUFACTURER. wILL FIT  as soon as remakes arrive.   Pt will resume LE care after ileostomy reversal on 9/19. Hopefully garment remakes will be available then.      OT Occupational Profile and History Detailed Assessment- Review of Records and additional review of physical, cognitive, psychosocial history related to current functional performance     Occupational performance deficits (Please refer to evaluation for details): ADL's;IADL's;Rest and Sleep;Work;Leisure;Social Participation     Body Structure / Function / Physical Skills ADL;Edema;Skin  integrity;Pain;Gait;ROM;Decreased knowledge of precautions;Decreased knowledge of use of DME;IADL     Rehab Potential Good     Clinical  Decision Making Several treatment options, min-mod task modification necessary     Comorbidities Affecting Occupational Performance: Presence of comorbidities impacting occupational performance     Modification or Assistance to Complete Evaluation  Min-Moderate modification of tasks or assist with assess necessary to complete eval     OT Frequency 2x / week     OT Duration 12 weeks     OT Treatment/Interventions Self-care/ADL training;Therapeutic exercise;Manual Therapy;Coping strategies training;Therapeutic activities;Manual lymph drainage;DME and/or AE instruction;Compression bandaging;Other (comment);Scar mobilization;Patient/family education   skin care throughout MLD to limit infection risk    Plan BLE/BLQ CDT including MLD, skin care, compression wrapping, ther ex, fitting with appropriate compression garments/ devices when optimal reduction achieved. Pt will benefit from advanced sequential ppneumatic compression device, Flexitouch, for long term self-managemnt of LE after Intensive Phase CDT compete. Set up clinical trial ASAP.     Consulted and Agree with Plan of Care Patient;Family member/caregiver      Andrey Spearman, MS, OTR/L, CLT-LANA 08/18/22 3:06 PM

## 2022-08-24 ENCOUNTER — Encounter
Admission: RE | Disposition: A | Discharge status: Home / Self Care | Payer: Self-pay | Source: Home / Self Care | Attending: Surgery

## 2022-08-24 ENCOUNTER — Encounter: Payer: Self-pay | Admitting: Surgery

## 2022-08-24 ENCOUNTER — Other Ambulatory Visit: Payer: Self-pay

## 2022-08-24 ENCOUNTER — Inpatient Hospital Stay
Admission: RE | Admit: 2022-08-24 | Discharge: 2022-08-26 | DRG: 330 | Disposition: A | Discharge status: Home / Self Care | Payer: BC Managed Care – PPO | Attending: Surgery | Admitting: Surgery

## 2022-08-24 ENCOUNTER — Inpatient Hospital Stay: Payer: BC Managed Care – PPO | Admitting: Anesthesiology

## 2022-08-24 DIAGNOSIS — Z85828 Personal history of other malignant neoplasm of skin: Secondary | ICD-10-CM

## 2022-08-24 DIAGNOSIS — Z91040 Latex allergy status: Secondary | ICD-10-CM | POA: Diagnosis not present

## 2022-08-24 DIAGNOSIS — Z79899 Other long term (current) drug therapy: Secondary | ICD-10-CM | POA: Diagnosis not present

## 2022-08-24 DIAGNOSIS — Z932 Ileostomy status: Secondary | ICD-10-CM

## 2022-08-24 DIAGNOSIS — E869 Volume depletion, unspecified: Secondary | ICD-10-CM | POA: Diagnosis not present

## 2022-08-24 DIAGNOSIS — D72828 Other elevated white blood cell count: Secondary | ICD-10-CM | POA: Diagnosis not present

## 2022-08-24 DIAGNOSIS — J45909 Unspecified asthma, uncomplicated: Secondary | ICD-10-CM | POA: Diagnosis present

## 2022-08-24 DIAGNOSIS — Z432 Encounter for attention to ileostomy: Principal | ICD-10-CM

## 2022-08-24 DIAGNOSIS — N179 Acute kidney failure, unspecified: Secondary | ICD-10-CM | POA: Diagnosis present

## 2022-08-24 DIAGNOSIS — Z9889 Other specified postprocedural states: Principal | ICD-10-CM | POA: Diagnosis present

## 2022-08-24 DIAGNOSIS — I10 Essential (primary) hypertension: Secondary | ICD-10-CM | POA: Diagnosis present

## 2022-08-24 HISTORY — PX: ILEOSTOMY CLOSURE: SHX1784

## 2022-08-24 SURGERY — CLOSURE, ILEOSTOMY
Anesthesia: General | Site: Abdomen

## 2022-08-24 MED ORDER — SCOPOLAMINE 1 MG/3DAYS TD PT72
MEDICATED_PATCH | TRANSDERMAL | Status: AC
  Filled 2022-08-24: qty 1

## 2022-08-24 MED ORDER — ORAL CARE MOUTH RINSE: 15.0000 mL | Freq: Once | OROMUCOSAL | Status: AC

## 2022-08-24 MED ORDER — ALBUTEROL SULFATE (2.5 MG/3ML) 0.083% IN NEBU: 3.0000 mL | INHALATION_SOLUTION | Freq: Four times a day (QID) | RESPIRATORY_TRACT | Status: DC | PRN

## 2022-08-24 MED ORDER — LACTATED RINGERS IV SOLN: INTRAVENOUS | Status: DC

## 2022-08-24 MED ORDER — DULOXETINE HCL 30 MG PO CPEP
90.0000 mg | ORAL_CAPSULE | Freq: Every day | ORAL | Status: DC
  Administered 2022-08-24 – 2022-08-26 (×3): 90 mg via ORAL
  Filled 2022-08-24 (×3): qty 3

## 2022-08-24 MED ORDER — ACETAMINOPHEN 500 MG PO TABS: 1000.0000 mg | ORAL_TABLET | ORAL | Status: AC

## 2022-08-24 MED ORDER — APREPITANT 40 MG PO CAPS
ORAL_CAPSULE | ORAL | Status: AC
  Filled 2022-08-24: qty 1

## 2022-08-24 MED ORDER — FENTANYL CITRATE (PF) 100 MCG/2ML IJ SOLN: 25.0000 ug | INTRAMUSCULAR | Status: DC | PRN

## 2022-08-24 MED ORDER — ACETAMINOPHEN 500 MG PO TABS
1000.0000 mg | ORAL_TABLET | Freq: Four times a day (QID) | ORAL | Status: DC
  Administered 2022-08-24 – 2022-08-26 (×7): 1000 mg via ORAL
  Filled 2022-08-24 (×7): qty 2

## 2022-08-24 MED ORDER — ONDANSETRON 4 MG PO TBDP: 4.0000 mg | ORAL_TABLET | Freq: Four times a day (QID) | ORAL | Status: DC | PRN

## 2022-08-24 MED ORDER — ACETAMINOPHEN 500 MG PO TABS
ORAL_TABLET | ORAL | Status: AC
  Filled 2022-08-24: qty 2

## 2022-08-24 MED ORDER — LISINOPRIL 20 MG PO TABS
20.0000 mg | ORAL_TABLET | Freq: Every day | ORAL | Status: DC
  Administered 2022-08-26: 20 mg via ORAL
  Filled 2022-08-24 (×2): qty 1

## 2022-08-24 MED ORDER — ARTIFICIAL TEARS OPHTHALMIC OINT
TOPICAL_OINTMENT | OPHTHALMIC | Status: DC | PRN
  Administered 2022-08-24: 1 via OPHTHALMIC

## 2022-08-24 MED ORDER — SODIUM CHLORIDE 0.9 % IV SOLN
2.0000 g | Freq: Three times a day (TID) | INTRAVENOUS | Status: AC
  Administered 2022-08-24 – 2022-08-25 (×3): 2 g via INTRAVENOUS
  Filled 2022-08-24 (×3): qty 2

## 2022-08-24 MED ORDER — CHLORHEXIDINE GLUCONATE CLOTH 2 % EX PADS
6.0000 | MEDICATED_PAD | Freq: Once | CUTANEOUS | Status: DC
  Administered 2022-08-24: 6 via TOPICAL

## 2022-08-24 MED ORDER — PROMETHAZINE HCL 25 MG/ML IJ SOLN
INTRAMUSCULAR | Status: AC
  Administered 2022-08-24: 12.5 mg via INTRAVENOUS
  Filled 2022-08-24: qty 1

## 2022-08-24 MED ORDER — BUPIVACAINE LIPOSOME 1.3 % IJ SUSP
INTRAMUSCULAR | Status: AC
  Filled 2022-08-24: qty 20

## 2022-08-24 MED ORDER — HYDROMORPHONE HCL 1 MG/ML IJ SOLN: 0.5000 mg | INTRAMUSCULAR | Status: DC | PRN

## 2022-08-24 MED ORDER — ALVIMOPAN 12 MG PO CAPS: 12.0000 mg | ORAL_CAPSULE | ORAL | Status: DC

## 2022-08-24 MED ORDER — CHLORTHALIDONE 25 MG PO TABS
50.0000 mg | ORAL_TABLET | Freq: Every morning | ORAL | Status: DC
  Administered 2022-08-25 – 2022-08-26 (×2): 50 mg via ORAL
  Filled 2022-08-24 (×2): qty 2

## 2022-08-24 MED ORDER — POLYETHYLENE GLYCOL 3350 17 G PO PACK: 17.0000 g | PACK | Freq: Every day | ORAL | Status: DC | PRN

## 2022-08-24 MED ORDER — HYDROMORPHONE HCL 1 MG/ML IJ SOLN
INTRAMUSCULAR | Status: AC
  Filled 2022-08-24: qty 1

## 2022-08-24 MED ORDER — 0.9 % SODIUM CHLORIDE (POUR BTL) OPTIME
TOPICAL | Status: DC | PRN
  Administered 2022-08-24: 500 mL

## 2022-08-24 MED ORDER — FENTANYL CITRATE (PF) 100 MCG/2ML IJ SOLN
INTRAMUSCULAR | Status: DC | PRN
  Administered 2022-08-24: 50 ug via INTRAVENOUS
  Administered 2022-08-24 (×2): 25 ug via INTRAVENOUS

## 2022-08-24 MED ORDER — PROPOFOL 500 MG/50ML IV EMUL
INTRAVENOUS | Status: DC | PRN
  Administered 2022-08-24: 165 ug/kg/min via INTRAVENOUS

## 2022-08-24 MED ORDER — GLYCOPYRROLATE 0.2 MG/ML IJ SOLN
INTRAMUSCULAR | Status: DC | PRN
  Administered 2022-08-24: .2 mg via INTRAVENOUS

## 2022-08-24 MED ORDER — CHLORHEXIDINE GLUCONATE 0.12 % MT SOLN: 15.0000 mL | Freq: Once | OROMUCOSAL | Status: AC

## 2022-08-24 MED ORDER — GABAPENTIN 300 MG PO CAPS: 300.0000 mg | ORAL_CAPSULE | ORAL | Status: AC

## 2022-08-24 MED ORDER — PANTOPRAZOLE SODIUM 40 MG IV SOLR
40.0000 mg | Freq: Every day | INTRAVENOUS | Status: DC
  Administered 2022-08-24 – 2022-08-25 (×2): 40 mg via INTRAVENOUS
  Filled 2022-08-24 (×2): qty 10

## 2022-08-24 MED ORDER — DEXAMETHASONE SODIUM PHOSPHATE 10 MG/ML IJ SOLN
INTRAMUSCULAR | Status: DC | PRN
  Administered 2022-08-24: 10 mg via INTRAVENOUS

## 2022-08-24 MED ORDER — MIDAZOLAM HCL 2 MG/2ML IJ SOLN
INTRAMUSCULAR | Status: AC
  Filled 2022-08-24: qty 2

## 2022-08-24 MED ORDER — SCOPOLAMINE 1 MG/3DAYS TD PT72
1.0000 | MEDICATED_PATCH | TRANSDERMAL | Status: DC
  Administered 2022-08-24: 1.5 mg via TRANSDERMAL

## 2022-08-24 MED ORDER — ADULT MULTIVITAMIN W/MINERALS CH
1.0000 | ORAL_TABLET | Freq: Every day | ORAL | Status: DC
  Administered 2022-08-24 – 2022-08-26 (×3): 1 via ORAL
  Filled 2022-08-24 (×3): qty 1

## 2022-08-24 MED ORDER — OXYCODONE HCL 5 MG PO TABS: 5.0000 mg | ORAL_TABLET | Freq: Once | ORAL | Status: DC | PRN

## 2022-08-24 MED ORDER — FAMOTIDINE 20 MG PO TABS
ORAL_TABLET | ORAL | Status: AC
  Administered 2022-08-24: 20 mg via ORAL
  Filled 2022-08-24: qty 1

## 2022-08-24 MED ORDER — PHENYLEPHRINE HCL (PRESSORS) 10 MG/ML IV SOLN
INTRAVENOUS | Status: AC
  Filled 2022-08-24: qty 1

## 2022-08-24 MED ORDER — OXYCODONE HCL 5 MG PO TABS
5.0000 mg | ORAL_TABLET | ORAL | Status: DC | PRN
  Administered 2022-08-25: 10 mg via ORAL
  Filled 2022-08-24 (×3): qty 2

## 2022-08-24 MED ORDER — ALVIMOPAN 12 MG PO CAPS
ORAL_CAPSULE | ORAL | Status: AC
  Administered 2022-08-24: 12 mg via ORAL
  Filled 2022-08-24: qty 1

## 2022-08-24 MED ORDER — PROPOFOL 1000 MG/100ML IV EMUL
INTRAVENOUS | Status: AC
  Filled 2022-08-24: qty 100

## 2022-08-24 MED ORDER — PHENYLEPHRINE 80 MCG/ML (10ML) SYRINGE FOR IV PUSH (FOR BLOOD PRESSURE SUPPORT)
PREFILLED_SYRINGE | INTRAVENOUS | Status: AC
  Filled 2022-08-24: qty 10

## 2022-08-24 MED ORDER — GABAPENTIN 100 MG PO CAPS
100.0000 mg | ORAL_CAPSULE | Freq: Every day | ORAL | Status: DC
  Administered 2022-08-24 – 2022-08-25 (×2): 100 mg via ORAL
  Filled 2022-08-24 (×2): qty 1

## 2022-08-24 MED ORDER — SODIUM CHLORIDE 0.9 % IV SOLN
INTRAVENOUS | Status: AC
  Filled 2022-08-24: qty 2

## 2022-08-24 MED ORDER — SODIUM CHLORIDE 0.9 % IV SOLN
12.5000 mg | Freq: Four times a day (QID) | INTRAVENOUS | Status: DC | PRN
  Administered 2022-08-24 – 2022-08-26 (×3): 12.5 mg via INTRAVENOUS
  Filled 2022-08-24: qty 12.5
  Filled 2022-08-24 (×2): qty 0.5

## 2022-08-24 MED ORDER — OXYCODONE HCL 5 MG PO TABS
ORAL_TABLET | ORAL | Status: AC
  Administered 2022-08-24: 5 mg via ORAL
  Filled 2022-08-24: qty 1

## 2022-08-24 MED ORDER — ONDANSETRON HCL 4 MG/2ML IJ SOLN
INTRAMUSCULAR | Status: DC | PRN
  Administered 2022-08-24 (×2): 4 mg via INTRAVENOUS

## 2022-08-24 MED ORDER — BUPIVACAINE-EPINEPHRINE (PF) 0.5% -1:200000 IJ SOLN
INTRAMUSCULAR | Status: AC
  Filled 2022-08-24: qty 30

## 2022-08-24 MED ORDER — LIDOCAINE HCL (CARDIAC) PF 100 MG/5ML IV SOSY
PREFILLED_SYRINGE | INTRAVENOUS | Status: DC | PRN
  Administered 2022-08-24: 80 mg via INTRAVENOUS

## 2022-08-24 MED ORDER — BUPIVACAINE-EPINEPHRINE 0.5% -1:200000 IJ SOLN
INTRAMUSCULAR | Status: DC | PRN
  Administered 2022-08-24: 50 mL via INTRAMUSCULAR

## 2022-08-24 MED ORDER — ONDANSETRON HCL 4 MG/2ML IJ SOLN: 4.0000 mg | Freq: Once | INTRAMUSCULAR | Status: DC | PRN

## 2022-08-24 MED ORDER — BUPIVACAINE LIPOSOME 1.3 % IJ SUSP: 20.0000 mL | Freq: Once | INTRAMUSCULAR | Status: DC

## 2022-08-24 MED ORDER — ROCURONIUM BROMIDE 100 MG/10ML IV SOLN
INTRAVENOUS | Status: DC | PRN
  Administered 2022-08-24: 60 mg via INTRAVENOUS

## 2022-08-24 MED ORDER — KETOROLAC TROMETHAMINE 30 MG/ML IJ SOLN
30.0000 mg | Freq: Four times a day (QID) | INTRAMUSCULAR | Status: DC
  Administered 2022-08-24 – 2022-08-25 (×4): 30 mg via INTRAVENOUS
  Filled 2022-08-24 (×3): qty 1

## 2022-08-24 MED ORDER — ACETAMINOPHEN 500 MG PO TABS
ORAL_TABLET | ORAL | Status: AC
  Administered 2022-08-24: 1000 mg via ORAL
  Filled 2022-08-24: qty 2

## 2022-08-24 MED ORDER — FAMOTIDINE 20 MG PO TABS: 20.0000 mg | ORAL_TABLET | Freq: Once | ORAL | Status: AC

## 2022-08-24 MED ORDER — DUTASTERIDE 0.5 MG PO CAPS
0.5000 mg | ORAL_CAPSULE | Freq: Every day | ORAL | Status: DC
  Administered 2022-08-24 – 2022-08-26 (×3): 0.5 mg via ORAL
  Filled 2022-08-24 (×3): qty 1

## 2022-08-24 MED ORDER — ALVIMOPAN 12 MG PO CAPS
12.0000 mg | ORAL_CAPSULE | Freq: Two times a day (BID) | ORAL | Status: DC
  Filled 2022-08-24: qty 1

## 2022-08-24 MED ORDER — SODIUM CHLORIDE 0.9 % IV SOLN
2.0000 g | INTRAVENOUS | Status: AC
  Administered 2022-08-24: 2 g via INTRAVENOUS

## 2022-08-24 MED ORDER — LACTATED RINGERS IV SOLN: INTRAVENOUS | Status: DC | PRN

## 2022-08-24 MED ORDER — ONDANSETRON HCL 4 MG/2ML IJ SOLN
4.0000 mg | Freq: Four times a day (QID) | INTRAMUSCULAR | Status: DC | PRN
  Administered 2022-08-24 – 2022-08-25 (×2): 4 mg via INTRAVENOUS
  Filled 2022-08-24 (×2): qty 2

## 2022-08-24 MED ORDER — OXYCODONE HCL 5 MG/5ML PO SOLN: 5.0000 mg | Freq: Once | ORAL | Status: DC | PRN

## 2022-08-24 MED ORDER — ENOXAPARIN SODIUM 40 MG/0.4ML IJ SOSY
40.0000 mg | PREFILLED_SYRINGE | INTRAMUSCULAR | Status: DC
  Administered 2022-08-25: 40 mg via SUBCUTANEOUS
  Filled 2022-08-24 (×2): qty 0.4

## 2022-08-24 MED ORDER — PROPOFOL 10 MG/ML IV BOLUS
INTRAVENOUS | Status: DC | PRN
  Administered 2022-08-24: 120 mg via INTRAVENOUS

## 2022-08-24 MED ORDER — PHENYLEPHRINE HCL-NACL 20-0.9 MG/250ML-% IV SOLN
INTRAVENOUS | Status: DC | PRN
  Administered 2022-08-24: 15 ug/min via INTRAVENOUS

## 2022-08-24 MED ORDER — HYDROXYCHLOROQUINE SULFATE 200 MG PO TABS
200.0000 mg | ORAL_TABLET | Freq: Two times a day (BID) | ORAL | Status: DC
  Administered 2022-08-24 – 2022-08-26 (×5): 200 mg via ORAL
  Filled 2022-08-24 (×5): qty 1

## 2022-08-24 MED ORDER — CHLORHEXIDINE GLUCONATE 0.12 % MT SOLN
OROMUCOSAL | Status: AC
  Administered 2022-08-24: 15 mL via OROMUCOSAL
  Filled 2022-08-24: qty 15

## 2022-08-24 MED ORDER — ALVIMOPAN 12 MG PO CAPS: 12.0000 mg | ORAL_CAPSULE | ORAL | Status: AC

## 2022-08-24 MED ORDER — SUGAMMADEX SODIUM 200 MG/2ML IV SOLN
INTRAVENOUS | Status: DC | PRN
  Administered 2022-08-24: 200 mg via INTRAVENOUS

## 2022-08-24 MED ORDER — ACETAMINOPHEN 10 MG/ML IV SOLN: 1000.0000 mg | Freq: Once | INTRAVENOUS | Status: DC | PRN

## 2022-08-24 MED ORDER — GABAPENTIN 300 MG PO CAPS
ORAL_CAPSULE | ORAL | Status: AC
  Administered 2022-08-24: 300 mg via ORAL
  Filled 2022-08-24: qty 1

## 2022-08-24 MED ORDER — KETOROLAC TROMETHAMINE 30 MG/ML IJ SOLN
INTRAMUSCULAR | Status: DC | PRN
  Administered 2022-08-24: 30 mg via INTRAVENOUS

## 2022-08-24 MED ORDER — FENTANYL CITRATE (PF) 100 MCG/2ML IJ SOLN
INTRAMUSCULAR | Status: AC
  Filled 2022-08-24: qty 2

## 2022-08-24 MED ORDER — HYDROMORPHONE HCL 1 MG/ML IJ SOLN
INTRAMUSCULAR | Status: DC | PRN
  Administered 2022-08-24: 1 mg via INTRAVENOUS

## 2022-08-24 SURGICAL SUPPLY — 48 items
CHLORAPREP W/TINT 26 (MISCELLANEOUS) ×1 IMPLANT
DRAIN PENROSE 12X.25 LTX STRL (MISCELLANEOUS) IMPLANT
DRAPE LAPAROTOMY 100X77 ABD (DRAPES) ×1 IMPLANT
DRSG OPSITE POSTOP 4X6 (GAUZE/BANDAGES/DRESSINGS) IMPLANT
ELECT CAUTERY BLADE 6.4 (BLADE) ×1 IMPLANT
ELECT CAUTERY BLADE TIP 2.5 (TIP) ×1
ELECT REM PT RETURN 9FT ADLT (ELECTROSURGICAL) ×1
ELECTRODE CAUTERY BLDE TIP 2.5 (TIP) ×1 IMPLANT
ELECTRODE REM PT RTRN 9FT ADLT (ELECTROSURGICAL) ×1 IMPLANT
GAUZE 4X4 16PLY ~~LOC~~+RFID DBL (SPONGE) ×1 IMPLANT
GAUZE SPONGE 4X4 12PLY STRL (GAUZE/BANDAGES/DRESSINGS) ×1 IMPLANT
GLOVE SURG SYN 7.0 (GLOVE) ×1 IMPLANT
GLOVE SURG SYN 7.0 PF PI (GLOVE) ×1 IMPLANT
GLOVE SURG SYN 7.5  E (GLOVE) ×1
GLOVE SURG SYN 7.5 E (GLOVE) ×1 IMPLANT
GLOVE SURG SYN 7.5 PF PI (GLOVE) ×1 IMPLANT
GOWN STRL REUS W/ TWL LRG LVL3 (GOWN DISPOSABLE) ×2 IMPLANT
GOWN STRL REUS W/ TWL XL LVL3 (GOWN DISPOSABLE) ×1 IMPLANT
GOWN STRL REUS W/TWL LRG LVL3 (GOWN DISPOSABLE) ×2
GOWN STRL REUS W/TWL XL LVL3 (GOWN DISPOSABLE) ×1
HOLDER FOLEY CATH W/STRAP (MISCELLANEOUS) ×1 IMPLANT
LABEL OR SOLS (LABEL) ×1 IMPLANT
MANIFOLD NEPTUNE II (INSTRUMENTS) ×1 IMPLANT
NEEDLE HYPO 22GX1.5 SAFETY (NEEDLE) ×1 IMPLANT
NS IRRIG 500ML POUR BTL (IV SOLUTION) IMPLANT
PACK BASIN MAJOR ARMC (MISCELLANEOUS) ×1 IMPLANT
RELOAD PROXIMATE 75MM BLUE (ENDOMECHANICALS) ×2 IMPLANT
RELOAD STAPLE 75 3.8 BLU REG (ENDOMECHANICALS) IMPLANT
SOL PREP PVP 2OZ (MISCELLANEOUS) ×1
SOLUTION PREP PVP 2OZ (MISCELLANEOUS) ×1 IMPLANT
SPONGE T-LAP 18X18 ~~LOC~~+RFID (SPONGE) ×3 IMPLANT
STAPLER PROXIMATE 75MM BLUE (STAPLE) IMPLANT
STAPLER SKIN PROX 35W (STAPLE) ×1 IMPLANT
SURGILUBE 2OZ TUBE FLIPTOP (MISCELLANEOUS) ×1 IMPLANT
SUT PDS AB 1 CT1 36 (SUTURE) ×1 IMPLANT
SUT SILK 2 0 SH (SUTURE) ×1 IMPLANT
SUT SILK 2 0SH CR/8 30 (SUTURE) ×1 IMPLANT
SUT SILK 3-0 (SUTURE) ×1
SUT SILK 3-0 SH-1 18XCR BRD (SUTURE) ×1
SUT VIC AB 2-0 SH 27 (SUTURE) ×2
SUT VIC AB 2-0 SH 27XBRD (SUTURE) IMPLANT
SUTURE SILK 3-0 SH-1 18XCR BRD (SUTURE) IMPLANT
SYR 10ML LL (SYRINGE) ×1 IMPLANT
SYR 20ML LL LF (SYRINGE) ×2 IMPLANT
SYR BULB IRRIG 60ML STRL (SYRINGE) ×1 IMPLANT
TRAP FLUID SMOKE EVACUATOR (MISCELLANEOUS) ×1 IMPLANT
TRAY FOLEY MTR SLVR 16FR STAT (SET/KITS/TRAYS/PACK) ×1 IMPLANT
WATER STERILE IRR 500ML POUR (IV SOLUTION) ×1 IMPLANT

## 2022-08-24 NOTE — Brief Op Note (Signed)
08/24/2022  10:10 AM  PATIENT:  Elizabeth Martin  61 y.o. female  PRE-OPERATIVE DIAGNOSIS:  Ileostomy in place  POST-OPERATIVE DIAGNOSIS:  Ileostomy in place  PROCEDURE:  Procedure(s): LOOP ILEOSTOMY TAKEDOWN  SURGEON:  Surgeon(s) and Role:    * Merilee Wible, MD - Primary  ANESTHESIA:   general  EBL:  20 mL   BLOOD ADMINISTERED:none  DRAINS: Penrose drain in the ostomy site    LOCAL MEDICATIONS USED:  BUPIVICAINE   SPECIMEN:  Loop ileostomy  DISPOSITION OF SPECIMEN:  PATHOLOGY  COUNTS:  YES  DICTATION: .Dragon Dictation  PLAN OF CARE: Admit to inpatient   PATIENT DISPOSITION:  PACU - hemodynamically stable.   Delay start of Pharmacological VTE agent (>24hrs) due to surgical blood loss or risk of bleeding: yes

## 2022-08-24 NOTE — Anesthesia Preprocedure Evaluation (Signed)
Anesthesia Evaluation  Patient identified by MRN, date of birth, ID band Patient awake  General Assessment Comment:  Documented corneal abrasion last time, but she doesn't remember anything about it. Fentanyl allergy 10 years ago (rash) but has documented fentanyl use during surgery this year with no apparent adverse effects.  Reviewed: Allergy & Precautions, NPO status , Patient's Chart, lab work & pertinent test results  History of Anesthesia Complications (+) PONV and history of anesthetic complications  Airway Mallampati: I  TM Distance: >3 FB Neck ROM: Full    Dental no notable dental hx. (+) Teeth Intact   Pulmonary asthma , neg sleep apnea, neg COPD, Patient abstained from smoking.Not current smoker,    Pulmonary exam normal breath sounds clear to auscultation       Cardiovascular Exercise Tolerance: Good METShypertension, Pt. on medications (-) CAD and (-) Past MI (-) dysrhythmias  Rhythm:Regular Rate:Normal - Systolic murmurs    Neuro/Psych negative neurological ROS  negative psych ROS   GI/Hepatic hiatal hernia, GERD  Medicated and Controlled,(+)     (-) substance abuse  ,   Endo/Other  neg diabetes  Renal/GU negative Renal ROS     Musculoskeletal   Abdominal   Peds  Hematology   Anesthesia Other Findings Past Medical History: No date: Asthma No date: Autoimmune disease ([place]) No date: [facility])     Comment:  Endometrial lymph node removal (30) 05/24/2022: Corneal abrasion, right No date: GERD (gastroesophageal reflux disease) No date: H/O blood clots     Comment:  upper rt groin and behind both knees No date: History of hiatal hernia No date: Hypertension No date: Lymphedema     Comment:  right leg 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 No date: PONV (postoperative nausea and vomiting)     Comment:  states gets violently ill  Reproductive/Obstetrics                              Anesthesia Physical Anesthesia Plan  ASA: 2  Anesthesia Plan: General   Post-op Pain Management: Tylenol PO (pre-op)* and Gabapentin PO (pre-op)*   Induction: Intravenous  PONV Risk Score and Plan: 4 or greater and Ondansetron, Dexamethasone, Midazolam, TIVA, Propofol infusion and Scopolamine patch - Pre-op  Airway Management Planned: Oral ETT  Additional Equipment: None  Intra-op Plan:   Post-operative Plan: Extubation in OR  Informed Consent: I have reviewed the patients History and Physical, chart, labs and discussed the procedure including the risks, benefits and alternatives for the proposed anesthesia with the patient or authorized representative who has indicated his/her understanding and acceptance.     Dental advisory given  Plan Discussed with: CRNA and Surgeon  Anesthesia Plan Comments: (Discussed risks of anesthesia with patient, including PONV, sore throat, lip/dental/eye damage. Rare risks discussed as well, such as cardiorespiratory and neurological sequelae, and allergic reactions. Discussed the role of CRNA in patient's perioperative care. Patient understands.  Patient apparently difficult IV placement, nurses tried 3 times with one almost vasovagal episode. Veins look present and visible to me; will place IV in the OR with N2O if needed if patient continues with anxiety/vasovagal.)        Anesthesia Quick Evaluation

## 2022-08-24 NOTE — Anesthesia Postprocedure Evaluation (Signed)
Anesthesia Post Note  Patient: Elizabeth Martin  Procedure(s) Performed: ILEOSTOMY TAKEDOWN, open loop with Edison Simon, PA-C to assist (Abdomen)  Patient location during evaluation: PACU Anesthesia Type: General Level of consciousness: awake and alert Pain management: pain level controlled Vital Signs Assessment: post-procedure vital signs reviewed and stable Respiratory status: spontaneous breathing, nonlabored ventilation, respiratory function stable and patient connected to nasal cannula oxygen Cardiovascular status: blood pressure returned to baseline and stable Postop Assessment: no apparent nausea or vomiting Anesthetic complications: no   No notable events documented.   Last Vitals:  Vitals:   08/24/22 1045 08/24/22 1100  BP: 115/73 110/68  Pulse: 85 79  Resp: 11 10  Temp:    SpO2: 94% 96%    Last Pain:  Vitals:   08/24/22 1045  TempSrc:   PainSc: 0-No pain                 Arita Miss

## 2022-08-24 NOTE — Interval H&P Note (Signed)
History and Physical Interval Note:  08/24/2022 7:11 AM  Elizabeth Martin  has presented today for surgery, with the diagnosis of Ileostomy in place.  The various methods of treatment have been discussed with the patient and family. After consideration of risks, benefits and other options for treatment, the patient has consented to  Procedure(s): ILEOSTOMY TAKEDOWN, open loop with Edison Simon, PA-C to assist (N/A) as a surgical intervention.  The patient's history has been reviewed, patient examined, no change in status, stable for surgery.  I have reviewed the patient's chart and labs.  Questions were answered to the patient's satisfaction.     Kyren Vaux

## 2022-08-24 NOTE — Op Note (Signed)
  Procedure Date:  08/24/2022  Pre-operative Diagnosis:  Loop ileostomy in place  Post-operative Diagnosis: Loop ileostomy in place  Procedure:  Loop ileostomy closure  Surgeon:  Melvyn Neth, MD  Anesthesia:  General endotracheal  Estimated Blood Loss:  20 ml  Specimens:  loop ileostomy  Complications:  None  Indications for Procedure:  This is a 61 y.o. female with a recent history of robotic sigmoidectomy with diverting loop ileostomy for a sigmoid stricture.  Now she presents for loop ileostomy closure.  She had contrast enema which showed intact anastomosis without any leak.  The risks of bleeding, abscess or infection, injury to surrounding structures, and need for further procedures were all discussed with the patient and was willing to proceed.  Description of Procedure: The patient was correctly identified in the preoperative area and brought into the operating room.  The patient was placed supine with VTE prophylaxis in place.  Appropriate time-outs were performed.  Anesthesia was induced and the patient was intubated.  Foley catheter was placed.  Appropriate antibiotics were infused.  The functional limb of the ileostomy was sutured closed with 2-0 Silk suture.  The abdomen was prepped and draped in a sterile fashion.  We started but incising circumferentially at the mucocutaneous junction around the ostomy.  Cautery was used to dissect down the subcutaneous tissue and with careful dissection, the ileostomy limbs were freed from the adjacent subcutaneous tissue and fascia.  There was no injury to the bowel.  A 3-0 Silk suture was placed for retention between antimesenteric edges.  Then, the 2-0 silk suture was cut, reopening the functional limb, and a blue load GIA stapler was inserted and fired, creating a common channel.  A 2nd load was used to extend the length of the common channel.  Then, another load was used to close the common channel opening.  3-0 Silk sutures were used  to imbricate the staple line.  The anastomosis was replaced back inside the peritoneal cavity.  Exparel solution mixed with 0.5% bupivacaine with epi was infiltrated over the peritoneum, fascia, and subcutaneous tissue.  The posterior sheath was then closed with 2-0 Vicryl suture and the anterior sheath with #1 PDS.  Given her latex allergy, a penrose drain could not be used, and thus an IV kit tourniquet was fashioned into a 1/4 inch size drain and placed inside the wound.  The wound was then closed over the drain using 2-0 Vicryl, 3-0 Vicryl, and skin staples.  Staples were also used to secure the drain onto the skin.  The skin was cleaned and the wound was dressed with Honeycomb dressing.  Foley catheter was removed.  The patient was emerged from anesthesia and extubated and brought to the recovery room for further management.  The patient tolerated the procedure well and all counts were correct at the end of the case.   Melvyn Neth, MD

## 2022-08-24 NOTE — Anesthesia Procedure Notes (Signed)
Procedure Name: Intubation Date/Time: 08/24/2022 7:43 AM  Performed by: Kelton Pillar, CRNAPre-anesthesia Checklist: Patient identified, Emergency Drugs available, Suction available and Patient being monitored Patient Re-evaluated:Patient Re-evaluated prior to induction Oxygen Delivery Method: Circle system utilized Preoxygenation: Pre-oxygenation with 100% oxygen Induction Type: IV induction Ventilation: Mask ventilation without difficulty Laryngoscope Size: 3 and McGraph Grade View: Grade I Tube type: Oral Tube size: 6.5 mm Number of attempts: 1 Airway Equipment and Method: Stylet and Oral airway Placement Confirmation: ETT inserted through vocal cords under direct vision, positive ETCO2, breath sounds checked- equal and bilateral and CO2 detector Secured at: 21 cm Tube secured with: Tape Dental Injury: Teeth and Oropharynx as per pre-operative assessment

## 2022-08-24 NOTE — Transfer of Care (Addendum)
Immediate Anesthesia Transfer of Care Note  Patient: Elizabeth Martin  Procedure(s) Performed: ILEOSTOMY TAKEDOWN, open loop with Edison Simon, PA-C to assist (Abdomen)  Patient Location: PACU  Anesthesia Type:General  Level of Consciousness: awake, drowsy and patient cooperative  Airway & Oxygen Therapy: Patient Spontanous Breathing and Patient connected to face mask oxygen  Post-op Assessment: Report given to RN and Post -op Vital signs reviewed and stable  Post vital signs: Reviewed and stable  Last Vitals:  Vitals Value Taken Time  BP 111/71 08/24/22 1007  Temp 36.8 C 08/24/22 1007  Pulse 94 08/24/22 1014  Resp 13 08/24/22 1014  SpO2 98 % 08/24/22 1014  Vitals shown include unvalidated device data.  Last Pain:  Vitals:   08/24/22 1007  TempSrc:   PainSc: Asleep         Complications: No notable events documented.

## 2022-08-25 ENCOUNTER — Encounter: Payer: Self-pay | Admitting: Surgery

## 2022-08-25 ENCOUNTER — Encounter: Payer: BC Managed Care – PPO | Admitting: Occupational Therapy

## 2022-08-25 LAB — CBC
HCT: 34.7 % — ABNORMAL LOW (ref 36.0–46.0)
Hemoglobin: 11.7 g/dL — ABNORMAL LOW (ref 12.0–15.0)
MCH: 29.5 pg (ref 26.0–34.0)
MCHC: 33.7 g/dL (ref 30.0–36.0)
MCV: 87.4 fL (ref 80.0–100.0)
Platelets: 267 10*3/uL (ref 150–400)
RBC: 3.97 MIL/uL (ref 3.87–5.11)
RDW: 14.6 % (ref 11.5–15.5)
WBC: 12.3 10*3/uL — ABNORMAL HIGH (ref 4.0–10.5)
nRBC: 0 % (ref 0.0–0.2)

## 2022-08-25 LAB — BASIC METABOLIC PANEL
Anion gap: 7 (ref 5–15)
BUN: 20 mg/dL (ref 8–23)
CO2: 22 mmol/L (ref 22–32)
Calcium: 8.6 mg/dL — ABNORMAL LOW (ref 8.9–10.3)
Chloride: 101 mmol/L (ref 98–111)
Creatinine, Ser: 1.22 mg/dL — ABNORMAL HIGH (ref 0.44–1.00)
GFR, Estimated: 50 mL/min — ABNORMAL LOW (ref >60)
Glucose, Bld: 98 mg/dL (ref 70–99)
Potassium: 3.9 mmol/L (ref 3.5–5.1)
Sodium: 130 mmol/L — ABNORMAL LOW (ref 135–145)

## 2022-08-25 LAB — MAGNESIUM: Magnesium: 2 mg/dL (ref 1.7–2.4)

## 2022-08-25 LAB — SURGICAL PATHOLOGY

## 2022-08-25 MED ORDER — SODIUM CHLORIDE 0.9 % IV SOLN: INTRAVENOUS | Status: DC

## 2022-08-25 MED ORDER — KETOROLAC TROMETHAMINE 30 MG/ML IJ SOLN
15.0000 mg | Freq: Four times a day (QID) | INTRAMUSCULAR | Status: DC | PRN
  Administered 2022-08-25 – 2022-08-26 (×3): 15 mg via INTRAVENOUS
  Filled 2022-08-25 (×3): qty 1

## 2022-08-25 NOTE — TOC Initial Note (Signed)
Transition of Care [facility]) - Initial/Assessment Note    Patient Details  Name: Elizabeth Martin MRN: [id number] Date of Birth: Jan 29, 1961  Transition of Care [facility]) CM/SW Contact:    Beverly Sessions, RN Phone Number: 08/25/2022, 4:49 PM  Clinical Narrative:                   Transition of Care [facility]) Screening Note   Patient Details  Name: Elizabeth Martin Date of Birth: 1961/04/16   Transition of Care [facility]) CM/SW Contact:    Beverly Sessions, RN Phone Number: 08/25/2022, 4:49 PM    Transition of Care Department [facility]) has reviewed patient and no TOC needs have been identified at this time. We will continue to monitor patient advancement through interdisciplinary progression rounds. If new patient transition needs arise, please place a TOC consult.         Patient Goals and CMS Choice        Expected Discharge Plan and Services                                                Prior Living Arrangements/Services                       Activities of Daily Living Home Assistive Devices/Equipment: Eyeglasses, Gilford Rile (specify type), Bedside commode/3-in-1 ADL Screening (condition at time of admission) Patient's cognitive ability adequate to safely complete daily activities?: Yes Is the patient deaf or have difficulty hearing?: No Does the patient have difficulty seeing, even when wearing glasses/contacts?: No Does the patient have difficulty concentrating, remembering, or making decisions?: No Patient able to express need for assistance with ADLs?: Yes Does the patient have difficulty dressing or bathing?: No Independently performs ADLs?: Yes (appropriate for developmental age) Does the patient have difficulty walking or climbing stairs?: No Weakness of Legs: None Weakness of Arms/Hands: None  Permission Sought/Granted                  Emotional Assessment              Admission diagnosis:  S/P closure of ileostomy [Z98.890] Patient  Active Problem List   Diagnosis Date Noted   S/P closure of ileostomy 08/24/2022   Ileostomy in place [facility])    Colonic stricture ([place]) 05/19/2022   Large bowel obstruction ([place]) 05/19/2022   Hypokalemia 05/18/2022   Abdominal pain 05/18/2022   Gastroesophageal reflux disease 02/15/2020   Essential hypertension 08/09/2017   Asthma 06/04/1969   PCP:  Dianne Dun, MD Pharmacy:   [id number] [id number]: [phone]: [phone]    Social Determinants of Health (SDOH) Interventions    Readmission Risk Interventions     No data to display

## 2022-08-25 NOTE — Progress Notes (Addendum)
[place] Hospital Day(s): 1.   Post op day(s): 1 Day Post-Op.   Interval History:  Patient seen and examined No acute events or new complaints overnight.  Patient reports she is feeling well this morning She had an episode of emesis last night but attributes this to taking narcotics on empty stomach without antiemetics; resolved this morning No fever, chills Slight leukocytosis; 12.3K; likely reactive from surgery Bump in renal functions; sCr - 1.22; UO - 50 ccs + unmeasured  No electrolyte derangements She is on CLD; tolerating and wanting more She has had loose bowel movements  Vital signs in last 24 hours: [min-max] current  Temp:  [97.2 F (36.2 C)-98.4 F (36.9 C)] 98.4 F (36.9 C) (09/19 1907) Pulse Rate:  [72-102] 72 (09/20 0433) Resp:  [8-22] 18 (09/20 0433) BP: (92-116)/(63-75) 92/63 (09/20 0433) SpO2:  [92 %-100 %] 99 % (09/20 0433)     Height: '5\' 7"'$  (170.2 cm) Weight: 72.6 kg BMI (Calculated): 25.05   Intake/Output last 2 shifts:  09/19 0701 - 09/20 0700 In: 2917.1 [P.O.:560; I.V.:2007.1; IV Piggyback:350] Out: 75 [Urine:50; Blood:20]   Physical Exam:  Constitutional: alert, cooperative and no distress  Respiratory: breathing non-labored at rest  Cardiovascular: regular rate and sinus rhythm  Gastrointestinal: soft, non-tender, and non-distended; no rebound/guarding Integumentary: Ileostomy take down incision is CDI with staples; drain in midline, minimal drainage on honeycomb dressing  Labs:     Latest Ref Rng & Units 08/25/2022    4:38 AM 05/27/2022    4:11 AM 05/26/2022    4:15 AM  CBC  WBC 4.0 - 10.5 K/uL 12.3  7.1  7.0   Hemoglobin 12.0 - 15.0 g/dL 11.7  9.8  10.8   Hematocrit 36.0 - 46.0 % 34.7  30.3  33.6   Platelets 150 - 400 K/uL 267  230  251       Latest Ref Rng & Units 08/25/2022    4:38 AM 05/27/2022    4:11 AM 05/26/2022    4:15 AM  CMP  Glucose 70 - 99 mg/dL 98  107  90   BUN 8 - 23 mg/dL '20  7   6   '$ Creatinine 0.44 - 1.00 mg/dL 1.22  0.55  0.65   Sodium 135 - 145 mmol/L 130  135  139   Potassium 3.5 - 5.1 mmol/L 3.9  4.0  3.5   Chloride 98 - 111 mmol/L 101  107  109   CO2 22 - 32 mmol/L '22  23  27   '$ Calcium 8.9 - 10.3 mg/dL 8.6  8.6  8.6      Imaging studies: No new pertinent imaging studies   Assessment/Plan:  61 y.o. female with AKI, likely under resuscitation/volume depletion from surgery, 1 Day Post-Op s/p ileostomy takedown   - Will advance to FLD; Soft diet for dinner if doing well  - Continue IVF resuscitation given AKI - Discontinue Entereg - Complete perioperative Abx - Monitor abdominal examination; on-going bowel function   - Pain control prn; antiemetics prn   - Monitor leokocytosis; likely reactive  - Monitor renal function; morning labs - Mobilize  - Discharge Planning; Pending diet advancement today, monitor renal function. Anticipate DC in next 24-48 hours.    All of the above findings and recommendations were discussed with the patient, patient's family (husband at bedside), and the medical team, and all of patient's and family's questions were answered to their expressed satisfaction.  -- Edison Simon, PA-C  [facility] 08/25/2022, 7:25 AM M-F: 7am - 4pm

## 2022-08-25 NOTE — Plan of Care (Signed)
  Problem: Clinical Measurements: Goal: Ability to maintain clinical measurements within normal limits will improve Outcome: Progressing   Problem: Clinical Measurements: Goal: Will remain free from infection Outcome: Progressing   Problem: Clinical Measurements: Goal: Diagnostic test results will improve Outcome: Progressing   

## 2022-08-25 NOTE — Progress Notes (Signed)
Mobility Specialist - Progress Note   08/25/22 1140  Mobility  Activity Ambulated independently in hallway  Level of Assistance Independent  Assistive Device None  Distance Ambulated (ft) 160 ft  Activity Response Tolerated well  $Mobility charge 1 Mobility   Pt OOB on RA upon arrival. Pt STS and ambulates 1 lap around NS indep, with slow gait. Pt returns to bed with visitors in room and needs in reach.   Gretchen Short  Mobility Specialist  08/25/22 11:41 AM

## 2022-08-26 LAB — BASIC METABOLIC PANEL
Anion gap: 8 (ref 5–15)
BUN: 13 mg/dL (ref 8–23)
CO2: 23 mmol/L (ref 22–32)
Calcium: 8.8 mg/dL — ABNORMAL LOW (ref 8.9–10.3)
Chloride: 108 mmol/L (ref 98–111)
Creatinine, Ser: 0.64 mg/dL (ref 0.44–1.00)
GFR, Estimated: >60 mL/min (ref >60)
Glucose, Bld: 78 mg/dL (ref 70–99)
Potassium: 3.1 mmol/L — ABNORMAL LOW (ref 3.5–5.1)
Sodium: 139 mmol/L (ref 135–145)

## 2022-08-26 LAB — CBC
HCT: 36.7 % (ref 36.0–46.0)
Hemoglobin: 11.8 g/dL — ABNORMAL LOW (ref 12.0–15.0)
MCH: 29.4 pg (ref 26.0–34.0)
MCHC: 32.2 g/dL (ref 30.0–36.0)
MCV: 91.3 fL (ref 80.0–100.0)
Platelets: 219 10*3/uL (ref 150–400)
RBC: 4.02 MIL/uL (ref 3.87–5.11)
RDW: 15.4 % (ref 11.5–15.5)
WBC: 7.2 10*3/uL (ref 4.0–10.5)
nRBC: 0 % (ref 0.0–0.2)

## 2022-08-26 MED ORDER — POTASSIUM CHLORIDE CRYS ER 20 MEQ PO TBCR
40.0000 meq | EXTENDED_RELEASE_TABLET | ORAL | Status: AC
  Administered 2022-08-26 (×2): 40 meq via ORAL
  Filled 2022-08-26 (×2): qty 2

## 2022-08-26 MED ORDER — PROMETHAZINE HCL 12.5 MG PO TABS: 12.5000 mg | ORAL_TABLET | Freq: Four times a day (QID) | ORAL | 0 refills | Status: DC | PRN

## 2022-08-26 MED ORDER — BOOST / RESOURCE BREEZE PO LIQD CUSTOM: 1.0000 | Freq: Three times a day (TID) | ORAL | Status: DC

## 2022-08-26 MED ORDER — HYDROCODONE-ACETAMINOPHEN 5-325 MG PO TABS: 1.0000 | ORAL_TABLET | Freq: Four times a day (QID) | ORAL | 0 refills | Status: DC | PRN

## 2022-08-26 NOTE — Progress Notes (Signed)
Elizabeth Martin to be D/C'd Home per MD order.  Discussed prescriptions and follow up appointments with the patient. Prescriptions given to patient, medication list explained in detail. Pt verbalized understanding.  Allergies as of 08/26/2022       Reactions   Carboplatin Anaphylaxis   ANAPHYLACTIC REACTION ANAPHYLACTIC REACTION   Latex Hives, Itching, Rash   Nitrofurantoin Nausea Only, Nausea And Vomiting   Silicone Itching, Rash   Wheat Bran Other (See Comments)   Other reaction(s): Other (See Comments) Body aches and swelling Body aches and swelling   Fentanyl Hives, Itching, Rash        Medication List     TAKE these medications    albuterol 108 (90 Base) MCG/ACT inhaler Commonly known as: VENTOLIN HFA Inhale 2 puffs into the lungs every 6 (six) hours as needed.   celecoxib 100 MG capsule Commonly known as: CELEBREX Take 100 mg by mouth 2 (two) times daily as needed for moderate pain.   cetirizine 10 MG tablet Commonly known as: ZYRTEC Take 10 mg by mouth daily as needed.   chlorthalidone 50 MG tablet Commonly known as: HYGROTON Take 50 mg by mouth every morning.   DULoxetine 30 MG capsule Commonly known as: CYMBALTA Take 90 mg by mouth daily.   dutasteride 0.5 MG capsule Commonly known as: AVODART Take 0.5 mg by mouth daily.   folic acid 1 MG tablet Commonly known as: FOLVITE Take 1 mg by mouth daily.   gabapentin 100 MG capsule Commonly known as: NEURONTIN Take 100 mg by mouth at bedtime.   HYDROcodone-acetaminophen 5-325 MG tablet Commonly known as: NORCO/VICODIN Take 1 tablet by mouth every 6 (six) hours as needed for severe pain.   hydroxychloroquine 200 MG tablet Commonly known as: PLAQUENIL Take 200 mg by mouth 2 (two) times daily.   lisinopril 20 MG tablet Commonly known as: ZESTRIL Take 1 tablet by mouth daily.   methotrexate 2.5 MG tablet Commonly known as: RHEUMATREX Take 5 tablets by mouth every 7 (seven) days.   metroNIDAZOLE 500  MG tablet Commonly known as: FLAGYL Take by mouth.   multivitamin capsule Take 1 capsule by mouth daily.   prochlorperazine 5 MG tablet Commonly known as: COMPAZINE Take 1 tablet (5 mg total) by mouth every 6 (six) hours as needed for nausea or vomiting.   promethazine 12.5 MG tablet Commonly known as: PHENERGAN Take 1 tablet (12.5 mg total) by mouth every 6 (six) hours as needed for nausea or vomiting.        Vitals:   08/25/22 1900 08/26/22 0437  BP: 104/60 121/76  Pulse: 87 87  Resp: 18 20  Temp: (!) 97.5 F (36.4 C) 98.2 F (36.8 C)  SpO2: 98% 100%    Skin clean, dry and intact without evidence of skin break down, no evidence of skin tears noted. IV catheter discontinued intact. Site without signs and symptoms of complications. Dressing and pressure applied. Pt denies pain at this time. No complaints noted.  An After Visit Summary was printed and given to the patient. Patient escorted via [place], and D/C home via private auto.  Fuller Mandril, RN

## 2022-08-26 NOTE — Discharge Summary (Signed)
[facility] SURGICAL DISCHARGE SUMMARY  Patient ID: Elizabeth Martin MRN: [id number] DOB/AGE: Aug 13, 1961 61 y.o.  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   Hospital Course: Informed consent was obtained and documented, and patient underwent uneventful ileostomy takedown (Dr Hampton Abbot, 08/24/2022).  Post-operatively, patient did well. Advancement of patient's diet and ambulation were well-tolerated. The remainder of patient's hospital course was essentially unremarkable, and discharge planning was initiated accordingly with patient safely able to be discharged home with appropriate discharge instructions, pain control, and outpatient follow-up after all of her and her husband's questions were answered to their expressed satisfaction.   Discharge Condition: Good   Physical Examination:  Constitutional: alert, cooperative and no distress  Respiratory: breathing non-labored at rest  Cardiovascular: regular rate and sinus rhythm  Gastrointestinal: soft, non-tender, and non-distended; no rebound/guarding Integumentary: Ileostomy take down incision is CDI with staples; drain in midline, minimal drainage on honeycomb dressing (honeycomb removed)   Allergies as of 08/26/2022       Reactions   Carboplatin Anaphylaxis   ANAPHYLACTIC REACTION ANAPHYLACTIC REACTION   Latex Hives, Itching, Rash   Nitrofurantoin Nausea Only, Nausea And Vomiting   Silicone Itching, Rash   Wheat Bran Other (See Comments)   Other  reaction(s): Other (See Comments) Body aches and swelling Body aches and swelling   Fentanyl Hives, Itching, Rash        Medication List     TAKE these medications    albuterol 108 (90 Base) MCG/ACT inhaler Commonly known as: VENTOLIN HFA Inhale 2 puffs into the lungs every 6 (six) hours as needed.   celecoxib 100 MG capsule Commonly known as: CELEBREX Take 100 mg by mouth 2 (two) times daily as needed for moderate pain.   cetirizine 10 MG tablet Commonly known as: ZYRTEC Take 10 mg by mouth daily as needed.   chlorthalidone 50 MG tablet Commonly known as: HYGROTON Take 50 mg by mouth every morning.   DULoxetine 30 MG capsule Commonly known as: CYMBALTA Take 90 mg by mouth daily.   dutasteride 0.5 MG capsule Commonly known as: AVODART Take 0.5 mg by mouth daily.   folic acid 1 MG tablet Commonly known as: FOLVITE Take 1 mg by mouth daily.   gabapentin 100 MG capsule Commonly known as: NEURONTIN Take 100 mg by mouth at bedtime.   HYDROcodone-acetaminophen 5-325 MG tablet Commonly known as: NORCO/VICODIN Take 1 tablet by mouth every 6 (six) hours as needed for severe pain.   hydroxychloroquine 200 MG tablet Commonly known as: PLAQUENIL Take 200 mg by mouth 2 (two) times daily.   lisinopril 20 MG tablet Commonly known as: ZESTRIL Take 1 tablet by mouth daily.   methotrexate 2.5 MG tablet Commonly known as: RHEUMATREX Take 5 tablets by mouth every 7 (seven) days.   metroNIDAZOLE 500 MG tablet Commonly known as: FLAGYL Take by mouth.   multivitamin capsule Take 1 capsule by mouth daily.   prochlorperazine 5 MG tablet Commonly known as: COMPAZINE Take 1 tablet (5 mg total) by mouth every 6 (six) hours as  needed for nausea or vomiting.   promethazine 12.5 MG tablet Commonly known as: PHENERGAN Take 1 tablet (12.5 mg total) by mouth every 6 (six) hours as needed for nausea or vomiting.          Follow-up Information     Piscoya, Jacqulyn Bath, MD  Follow up in 10 day(s).   Specialty: General Surgery Why: s/p ileostomy takedown; has staples Contact information: [address] [place] [address] [phone]                  Time spent on discharge management including discussion of hospital course, clinical condition, outpatient instructions, prescriptions, and follow up with the patient and members of the medical team: >30 minutes  -- Edison Simon , PA-C [facility]  08/26/2022, 9:07 AM [phone] M-F: 7am - 4pm

## 2022-08-26 NOTE — Discharge Instructions (Addendum)
In addition to included general post-operative instructions,  Diet: Resume home diet.   Activity: No heavy lifting >20 pounds (children, pets, laundry, garbage) or strenuous activity for 6 weeks, but light activity and walking are encouraged. Do not drive or drink alcohol if taking narcotic pain medications or having pain that might distract from driving.  Wound care: You may shower/get incision wet with soapy water and pat dry (do not rub incisions), but no baths or submerging incision underwater until follow-up. We will plan to remove your staples at your first follow up appointment. Keep incision covered with gauze and tape.  Medications: Resume all home medications. For mild to moderate pain: acetaminophen (Tylenol) or ibuprofen/naproxen (if no kidney disease). Combining Tylenol with alcohol can substantially increase your risk of causing liver disease. Narcotic pain medications, if prescribed, can be used for severe pain, though may cause nausea, constipation, and drowsiness. Do not combine Tylenol and Percocet (or similar) within a 6 hour period as Percocet (and similar) contain(s) Tylenol. If you do not need the narcotic pain medication, you do not need to fill the prescription.  Call office: [phone]) at any time if any questions, worsening pain, fevers/chills, bleeding, drainage from incision site, or other concerns.

## 2022-09-01 ENCOUNTER — Encounter: Payer: BC Managed Care – PPO | Admitting: Occupational Therapy

## 2022-09-05 DIAGNOSIS — M545 Low back pain, unspecified: Secondary | ICD-10-CM | POA: Insufficient documentation

## 2022-09-06 ENCOUNTER — Encounter: Payer: Self-pay | Admitting: Surgery

## 2022-09-06 ENCOUNTER — Ambulatory Visit (INDEPENDENT_AMBULATORY_CARE_PROVIDER_SITE_OTHER): Payer: BC Managed Care – PPO | Admitting: Surgery

## 2022-09-06 VITALS — BP 126/73 | HR 84 | Temp 98.0°F

## 2022-09-06 DIAGNOSIS — Z932 Ileostomy status: Secondary | ICD-10-CM

## 2022-09-06 DIAGNOSIS — Z09 Encounter for follow-up examination after completed treatment for conditions other than malignant neoplasm: Secondary | ICD-10-CM

## 2022-09-06 MED ORDER — NYSTATIN 100000 UNIT/ML MT SUSP: 5.0000 mL | Freq: Four times a day (QID) | OROMUCOSAL | 1 refills | Status: AC

## 2022-09-06 NOTE — Patient Instructions (Signed)
We have sent in some Nystatin oral medication to your pharmacy. Use this until gone or for 7 days. One refill has been given.  Follow-up with our office as needed.  Please call and ask to speak with a nurse if you develop questions or concerns.   GENERAL POST-OPERATIVE PATIENT INSTRUCTIONS   WOUND CARE INSTRUCTIONS:  Keep a dry clean dressing on the wound if there is drainage. The initial bandage may be removed after 24 hours.  Once the wound has quit draining you may leave it open to air.  If clothing rubs against the wound or causes irritation and the wound is not draining you may cover it with a dry dressing during the daytime.  Try to keep the wound dry and avoid ointments on the wound unless directed to do so.  If the wound becomes bright red and painful or starts to drain infected material that is not clear, please contact your physician immediately.  If the wound is mildly pink and has a thick firm ridge underneath it, this is normal, and is referred to as a healing ridge.  This will resolve over the next 4-6 weeks.  BATHING: You may shower if you have been informed of this by your surgeon. However, Please do not submerge in a tub, hot tub, or pool until incisions are completely sealed or have been told by your surgeon that you may do so.  DIET:  You may eat any foods that you can tolerate.  It is a good idea to eat a high fiber diet and take in plenty of fluids to prevent constipation.  If you do become constipated you may want to take a mild laxative or take ducolax tablets on a daily basis until your bowel habits are regular.  Constipation can be very uncomfortable, along with straining, after recent surgery.  ACTIVITY:  You are encouraged to cough and deep breath or use your incentive spirometer if you were given one, every 15-30 minutes when awake.  This will help prevent respiratory complications and low grade fevers post-operatively if you had a general anesthetic.  You may want to hug  a pillow when coughing and sneezing to add additional support to the surgical area, if you had abdominal or chest surgery, which will decrease pain during these times.  You are encouraged to walk and engage in light activity for the next two weeks.  You should not lift more than 20 pounds for 6 weeks total after surgery as it could put you at increased risk for complications.  Twenty pounds is roughly equivalent to a plastic bag of groceries. At that time- Listen to your body when lifting, if you have pain when lifting, stop and then try again in a few days. Soreness after doing exercises or activities of daily living is normal as you get back in to your normal routine.  MEDICATIONS:  Try to take narcotic medications and anti-inflammatory medications, such as tylenol, ibuprofen, naprosyn, etc., with food.  This will minimize stomach upset from the medication.  Should you develop nausea and vomiting from the pain medication, or develop a rash, please discontinue the medication and contact your physician.  You should not drive, make important decisions, or operate machinery when taking narcotic pain medication.  SUNBLOCK Use sun block to incision area over the next year if this area will be exposed to sun. This helps decrease scarring and will allow you avoid a permanent darkened area over your incision.  QUESTIONS:  Please feel free  to call our office if you have any questions, and we will be glad to assist you. [phone]

## 2022-09-06 NOTE — Progress Notes (Signed)
09/06/2022  HPI: Jennifr Gaeta is a 61 y.o. female s/p open ileostomy takedown on 08/24/2022.  Patient had a drain and staples in place.  Patient reports that she has been doing well with improving abdominal discomfort.  However she has been having a lot of irritation and burning sensation in her mouth and tongue particularly with eating which has been ongoing since surgery.  Denies any nausea or vomiting and reports that her bowel movements are improving.  Vital signs: BP 126/73   Pulse 84   Temp 98 F (36.7 C)   SpO2 95%    Physical Exam: Constitutional: No acute distress Oral: There is some mucosal erythema as well as tongue erythema and swelling which could be thrush. Abdomen: Soft, nondistended, appropriately sore to palpation at the incision site.  Right lower quadrant incision is healing well and is clean, dry, intact with staples and drain in place.  Both drain and staples were removed today without any complications.  Dry gauze dressing applied.  Assessment/Plan: This is a 61 y.o. female s/p ileostomy reversal.  - Discussed with the patient that the discomfort symptoms that she is having in her mouth could be related to oral thrush.  She did get IV antibiotics for the surgery and 24 hours after which could have started this.  We will prescribe her as a precaution a course of nystatin swish and spit to see if this helps.  If this does not improve, I asked the patient to follow-up with her PCP to see if there is any other work-up that is needed. - Drain and staples were removed today without complications.  Dry gauze dressing applied.  Instructed patient on daily dressing changes until the drain opening sites are fully closed and healed. - Follow-up as needed.   Melvyn Neth, [facility]

## 2022-09-08 ENCOUNTER — Ambulatory Visit: Payer: BC Managed Care – PPO | Admitting: Occupational Therapy

## 2022-09-15 ENCOUNTER — Encounter: Payer: BC Managed Care – PPO | Admitting: Occupational Therapy

## 2022-09-22 ENCOUNTER — Ambulatory Visit: Payer: BC Managed Care – PPO | Admitting: Occupational Therapy

## 2022-09-29 ENCOUNTER — Ambulatory Visit: Payer: BC Managed Care – PPO | Attending: Family Medicine | Admitting: Occupational Therapy

## 2022-09-29 DIAGNOSIS — I890 Lymphedema, not elsewhere classified: Secondary | ICD-10-CM | POA: Insufficient documentation

## 2022-09-29 NOTE — Patient Instructions (Signed)

## 2022-09-29 NOTE — Therapy (Signed)
[place] MAIN [facility] SERVICES [address] [place], [place], [address] Phone: [phone]   Fax:  [phone]  Occupational Therapy Treatment  Patient Details  Name: Elizabeth Martin MRN: [id number] Date of Birth: 1961/07/27 No data recorded  Encounter Date: 09/29/2022   OT End of Session - 09/29/22 1516     Visit Number 12    Number of Visits 36    Date for OT Re-Evaluation 12/28/22    OT Start Time 0800    OT Stop Time 0905    OT Time Calculation (min) 65 min    Activity Tolerance Patient tolerated treatment well;No increased pain    Behavior During Therapy WFL for tasks assessed/performed             Past Medical History:  Diagnosis Date   Asthma    Autoimmune disease ([place])    Cancer ([place])    Endometrial lymph node removal (30)   Corneal abrasion, right 05/24/2022   GERD (gastroesophageal reflux disease)    H/O blood clots    upper rt groin and behind both knees   History of hiatal hernia    Hypertension    Lymphedema    right leg   Morphea scleroderma    PONV (postoperative nausea and vomiting)    states gets violently ill    Past Surgical History:  Procedure Laterality Date   BASAL CELL CARCINOMA EXCISION Left    CHOLECYSTECTOMY  2008   FLEXIBLE SIGMOIDOSCOPY N/A 05/19/2022   Procedure: FLEXIBLE SIGMOIDOSCOPY;  Surgeon: Toledo, Benay Pike, MD;  Location: [facility];  Service: Gastroenterology;  Laterality: N/A;   HERNIA REPAIR  2004   ILEOSTOMY CLOSURE N/A 08/24/2022   Procedure: ILEOSTOMY TAKEDOWN, open loop with Edison Simon, PA-C to assist;  Surgeon: Olean Ree, MD;  Location: [facility] ORS;  Service: General;  Laterality: N/A;   PLANTAR FASCIECTOMY     ROBOTIC ASSISTED TOTAL HYSTERECTOMY WITH BILATERAL SALPINGO OOPHERECTOMY  02/14/2012   ROTATOR CUFF REPAIR Left 03/24/2022   TONSILLECTOMY     XI ROBOTIC ASSISTED LOWER ANTERIOR RESECTION N/A 05/24/2022   Procedure: XI ROBOTIC ASSISTED LOWER ANTERIOR  RESECTION;  Surgeon: Olean Ree, MD;  Location: [facility] ORS;  Service: General;  Laterality: N/A;    There were no vitals filed for this visit.   Subjective Assessment - 09/29/22 1518     Subjective  Elizabeth Martin was last seen for OT on 9/13 , a few days before she underwent sx for an ostomy reversal. She presents for OT session to resume treatment for RLE cancer-related lymphedema. Unfortunately P's custom compression garments have not yet arrived from DME vendor, so they are not available for fitting today. DME vendor confirms via email after session that garments were shipped to our clinic yesterday. Pt does not rate LE-related RLE pain  numerically, but she complains that that thigh swelling stubbornly persists.    Patient is accompanied by: Family member    Pertinent History S/p Large bowel obstruction 2/2 colon stricture. 05/2022. Pt s/p ileostomy.  Exacerbation of BLE lymphedema s/p xi robotic resection. DVT ruled out.  03/24/22 L Rotator cuff repair  HTN  Asthma  Endometrial Ca Dx 02/2012. S/P total laparoscopic hysterectomy and LND ( 30 bilateral pelvic and periaortic LN). Adjuvant chemotherapy and XRT. S/p bilateral salpingo oophorectomy  Hx VTEs   *Pt reports Autoimmune disorder- Hashimoto's disease- no mention in medical record    Limitations difficulty walking, post surgical pain, shoulder pain, chronic, progressive, BLE swelling and associated pain,  impaired basic and instrumental ADLs, impaired productive activities and work, impaired leisure pursuits, impaired social participation    Special Tests FOTO Intake 49/100%    Currently in Pain? 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 Concentrated efforts on MLD, gentle myofascial release and scar massage.Minimal palpable softening noted today after session.    Compression Bandaging RLE: One each short stretch wrap applied over single layer of stockinett and Rosidal foam - 8 and 10 cm wide bandages. Applied 3 12 cm wide wraps using overlapping gradient techniques to extend wraps to groin.                    OT Education - 09/29/22 1526     Education Details Continued Pt/ CG edu for lymphedema self care home program throughout session. Topics include outcome of comparative limb volumetrics- starting limb volume differentials (LVDs), technology and gradient techniques used for short stretch, multilayer compression wrapping, simple self-MLD, therapeutic lymphatic pumping exercises, skin/nail care, LE precautions,. compression garment recommendations and specifications, wear and care schedule and compression garment donning / doffing w assistive devices. Discussed progress towards all OT goals since commencing CDT. All questions answered to the Pt's satisfaction. Good return.    Person(s) Educated Patient;Spouse    Methods Demonstration;Explanation;Handout    Comprehension Verbalized understanding;Returned demonstration;Need further instruction                 OT Long Term Goals - 08/11/22 1232       OT LONG TERM GOAL #1   Title Given this patient's Intake score of 49/100% on the functional outcomes FOTO tool, patient will experience an increase in function of 5 points to 54, to improve basic and instrumental ADLs performance, including lymphedema self-care.    Baseline 49    Time 12    Period Weeks    Status On-going    Target Date 08/28/22      OT LONG TERM GOAL #2   Title With mod assist  Pt will be able to apply multilayer, thigh length, compression wraps using gradient techniques from base of toes to groin  to decrease limb volume, to limit infection risk, and to limit lymphedema  progression.    Baseline dependent    Time 4    Period Days    Status Achieved    Target Date --   4th OT Rx visit     OT LONG TERM GOAL #3   Title Pt will achieve at least a 10% limb volume reduction in RLE, and 3% reduction on the L,  from ankle to groin, to return limb to more normal size and shape, to limit infection risk, to decrease pain, to improve AROM and function, and to limit lymphedema progression.    Baseline Max A ; 08/11/22: Met and exceeded with 20.8% limb volume reduction below the knee, 11.36% reduction in the thigh, and full limb reduction ( ankle to groin) measuring 14.5%.    Time 12    Period Weeks    Status  Achieved      OT LONG TERM GOAL #4   Title Pt will achieve and sustain no less than 85% compliance with all LE self-care home program components throughout CDT, including modified simple self-MLD, daily skin care and inspection, lymphatic pumping the ex and appropriate compression to limit lymphedema progression and to limit further functional decline.    Baseline Max A    Time 12    Period Weeks    Status Achieved      OT LONG TERM GOAL #5   Title Pt will be able to don and doff appropriate daytime compression garments with Max caregiver assistance to limit re-accumulation of lymphatic congestion and to limit infection  risk and skin breakdown.    Baseline Max A:  Custom compression garments being remade. Goal in progress.    Time 12    Period Weeks    Status On-going    Target Date 08/28/22      OT LONG TERM GOAL #6   Title Pt will be able to correctly perform all lymphedema self-care home program components using correct techniques with extra time and assistive devices PRN (modified independence), including simple self MLD, lymphatic pumping exercise, don and doff appropriate compression garments/ devices, and perform daily skin care regime to manage chronic lymphedema over time and to limit infection risk.    Baseline Max A    Time 12    Period Weeks     Status Achieved    Target Date 08/28/22                   Plan - 09/29/22 1540     Clinical Impression Statement Pt returns to OT today for lymphedema care after recent ostomy reversal. Pt reports she is doing well and regaining her strength steadily. She reports she is still under lifting restrictions s/p surgery.  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 Cont as per POC.Pt continues to benefit from skiled OT for lymphedema care to limit progression. Fit custom compression garments as soon as they are available.    OT Occupational Profile and History Detailed Assessment- Review of Records and additional review of physical, cognitive, psychosocial history related to current functional performance    Occupational performance deficits (Please refer to evaluation for details): ADL's;IADL's;Rest and Sleep;Work;Leisure;Social Participation    Body Structure / Function / Physical Skills ADL;Edema;Skin integrity;Pain;Gait;ROM;Decreased knowledge of precautions;Decreased knowledge of use of DME;IADL    Rehab Potential Good    Clinical Decision Making Several treatment options, min-mod task modification necessary    Comorbidities Affecting Occupational Performance: Presence of comorbidities impacting occupational performance    Modification or Assistance to Complete Evaluation  Min-Moderate modification of tasks or assist with assess necessary to complete eval    OT Frequency 2x / week    OT Duration 12 weeks    OT Treatment/Interventions Self-care/ADL training;Therapeutic exercise;Manual Therapy;Coping strategies training;Therapeutic activities;Manual lymph drainage;DME and/or AE instruction;Compression bandaging;Other (comment);Scar mobilization;Patient/family education   skin care throughout MLD to limit infection risk   Plan BLE/BLQ CDT including MLD, skin care,  compression wrapping, ther ex, fitting with appropriate compression garments/ devices when optimal reduction achieved. Pt will benefit from advanced sequential ppneumatic compression device, Flexitouch, for long term self-managemnt of LE after Intensive Phase CDT compete. Set up clinical trial ASAP.    Consulted and Agree with Plan of Care Patient;Family member/caregiver  Patient will benefit from skilled therapeutic intervention in order to improve the following deficits and impairments:   Body Structure / Function / Physical Skills: ADL, Edema, Skin integrity, Pain, Gait, ROM, Decreased knowledge of precautions, Decreased knowledge of use of DME, IADL       Visit Diagnosis: Lymphedema, not elsewhere classified    Problem List Patient Active Problem List   Diagnosis Date Noted   S/P closure of ileostomy 08/24/2022   Ileostomy in place [facility])    Colonic stricture ([place]) 05/19/2022   Large bowel obstruction ([place]) 05/19/2022   Hypokalemia 05/18/2022   Abdominal pain 05/18/2022   Gastroesophageal reflux disease 02/15/2020   Essential hypertension 08/09/2017   Asthma 06/04/1969    Andrey Spearman, MS, OTR/L, [facility] 09/29/22 3:45 PM   [place] MAIN [facility] SERVICES [address] [place], [place], [address] Phone: [phone]   Fax:  [phone]  Name: Elizabeth Martin MRN: [id number] Date of Birth: 22-Jan-1961

## 2022-10-06 ENCOUNTER — Ambulatory Visit: Payer: BC Managed Care – PPO | Attending: Family Medicine | Admitting: Occupational Therapy

## 2022-10-06 DIAGNOSIS — I890 Lymphedema, not elsewhere classified: Secondary | ICD-10-CM | POA: Diagnosis not present

## 2022-10-06 NOTE — Patient Instructions (Signed)

## 2022-10-06 NOTE — Therapy (Signed)
[place] MAIN [facility] SERVICES [address] [place], [place], [address] Phone: [phone]   Fax:  [phone]  Occupational Therapy Treatment  Patient Details  Name: Elizabeth Martin MRN: [id number] Date of Birth: Apr 05, 1961 No data recorded  Encounter Date: 10/06/2022   OT End of Session - 10/06/22 0844     Visit Number 13    Number of Visits 36    Date for OT Re-Evaluation 12/28/22    OT Start Time 0815    OT Stop Time 0915    OT Time Calculation (min) 60 min    Activity Tolerance Patient tolerated treatment well;No increased pain    Behavior During Therapy WFL for tasks assessed/performed             Past Medical History:  Diagnosis Date   Asthma    Autoimmune disease ([place])    Cancer ([place])    Endometrial lymph node removal (30)   Corneal abrasion, right 05/24/2022   GERD (gastroesophageal reflux disease)    H/O blood clots    upper rt groin and behind both knees   History of hiatal hernia    Hypertension    Lymphedema    right leg   Morphea scleroderma    PONV (postoperative nausea and vomiting)    states gets violently ill    Past Surgical History:  Procedure Laterality Date   BASAL CELL CARCINOMA EXCISION Left    CHOLECYSTECTOMY  2008   FLEXIBLE SIGMOIDOSCOPY N/A 05/19/2022   Procedure: FLEXIBLE SIGMOIDOSCOPY;  Surgeon: Toledo, Benay Pike, MD;  Location: [facility];  Service: Gastroenterology;  Laterality: N/A;   HERNIA REPAIR  2004   ILEOSTOMY CLOSURE N/A 08/24/2022   Procedure: ILEOSTOMY TAKEDOWN, open loop with Edison Simon, PA-C to assist;  Surgeon: Olean Ree, MD;  Location: [facility] ORS;  Service: General;  Laterality: N/A;   PLANTAR FASCIECTOMY     ROBOTIC ASSISTED TOTAL HYSTERECTOMY WITH BILATERAL SALPINGO OOPHERECTOMY  02/14/2012   ROTATOR CUFF REPAIR Left 03/24/2022   TONSILLECTOMY     XI ROBOTIC ASSISTED LOWER ANTERIOR RESECTION N/A 05/24/2022   Procedure: XI ROBOTIC ASSISTED LOWER ANTERIOR RESECTION;   Surgeon: Olean Ree, MD;  Location: [facility] ORS;  Service: General;  Laterality: N/A;    There were no vitals filed for this visit.   Subjective Assessment - 10/06/22 0911     Subjective  Elizabeth Martin presents for OT session to resume treatment for RLE cancer-related lymphedema. Pt tells me shde is now seeing a chiropractor in addition to other providers.  Pt's custom compression garments (remakes) are available  for fitting.Pt does not rate LE related pain numerically.    Patient is accompanied by: Family member    Pertinent History S/p Large bowel obstruction 2/2 colon stricture. 05/2022. Pt s/p ileostomy.  Exacerbation of BLE lymphedema s/p xi robotic resection. DVT ruled out.  03/24/22 L Rotator cuff repair  HTN  Asthma  Endometrial Ca Dx 02/2012. S/P total laparoscopic hysterectomy and LND ( 30 bilateral pelvic and periaortic LN). Adjuvant chemotherapy and XRT. S/p bilateral salpingo oophorectomy  Hx VTEs   *Pt reports Autoimmune disorder- Hashimoto's disease- no mention in medical record    Limitations difficulty walking, post surgical pain, shoulder pain, chronic, progressive, BLE swelling and associated pain, impaired basic and instrumental ADLs, impaired productive activities and work, impaired leisure pursuits, impaired social participation    Special Tests FOTO Intake 49/100%. Final FOTO 10/06/22: 63/100%    Pain Onset --   post op  OT Treatments/Exercises (OP) - 10/06/22 0914       ADLs   ADL Education Given Yes      Manual Therapy   Manual Therapy Edema management    Edema Management custom garment REMAKE fitting                    OT Education - 10/06/22 0915     Education Details Continued Pt/ CG edu for lymphedema self care home program throughout session. Topics include outcome of comparative limb volumetrics- starting limb volume differentials (LVDs), technology and gradient techniques used for short stretch, multilayer  compression wrapping, simple self-MLD, therapeutic lymphatic pumping exercises, skin/nail care, LE precautions,. compression garment recommendations and specifications, wear and care schedule and compression garment donning / doffing w assistive devices. Discussed progress towards all OT goals since commencing CDT. All questions answered to the Pt's satisfaction. Good return.    Person(s) Educated Patient;Spouse    Methods Demonstration;Explanation;Handout    Comprehension Verbalized understanding;Returned demonstration                 OT Long Term Goals - 10/06/22 0845       OT LONG TERM GOAL #1   Title Given this patient's Intake score of 49/100% on the functional outcomes FOTO tool, patient will experience an increase in function of 5 points to 54, to improve basic and instrumental ADLs performance, including lymphedema self-care.    Baseline 49. 10/06/22 End of Intensive Phase CDT: 63/100%, an increase of 14 points. GOAL MET    Time 12    Period Weeks    Status Achieved      OT LONG TERM GOAL #2   Title With mod assist  Pt will be able to apply multilayer, thigh length, compression wraps using gradient techniques from base of toes to groin  to decrease limb volume, to limit infection risk, and to limit lymphedema progression.    Baseline dependent    Time 4    Period Days    Status Achieved    Target Date --   4th OT Rx visit     OT LONG TERM GOAL #3   Title Pt will achieve at least a 10% limb volume reduction in RLE, and 3% reduction on the L,  from ankle to groin, to return limb to more normal size and shape, to limit infection risk, to decrease pain, to improve AROM and function, and to limit lymphedema progression.    Baseline Max A ; 08/11/22: Met and exceeded with 20.8% limb volume reduction below the knee, 11.36% reduction in the thigh, and full limb reduction ( ankle to groin) measuring 14.5%.    Time 12    Period Weeks    Status Achieved      OT LONG TERM GOAL #4    Title Pt will achieve and sustain no less than 85% compliance with all LE self-care home program components throughout CDT, including modified simple self-MLD, daily skin care and inspection, lymphatic pumping the ex and appropriate compression to limit lymphedema progression and to limit further functional decline.    Baseline Max A    Time 12    Period Weeks    Status Achieved      OT LONG TERM GOAL #5   Title Pt will be able to don and doff appropriate daytime compression garments with Max caregiver assistance to limit re-accumulation of lymphatic congestion and to limit infection  risk and skin breakdown.    Baseline Max A:  Custom compression garments being remade. Goal in progress.    Time 12    Period Weeks    Status Achieved      OT LONG TERM GOAL #6   Title Pt will be able to correctly perform all lymphedema self-care home program components using correct techniques with extra time and assistive devices PRN (modified independence), including simple self MLD, lymphatic pumping exercise, don and doff appropriate compression garments/ devices, and perform daily skin care regime to manage chronic lymphedema over time and to limit infection risk.    Baseline Max A    Time 12    Period Weeks    Status Achieved    Target Date 08/28/22                   Plan - 10/06/22 0916     Clinical Impression Statement Completed fitting for RLE/RLQ custom lymphedema compression garments and [employer] device. Appropriate flat knit garments fit correctly and Pt is able to don and doff using assistive devices (modified independence). Pt agrees to call with any garment related issues within 10 days. Reviewed OT goals for lymphedema care and Pt has met, and/or exceeded all. Pt agrees with plan to follow along with OTat 1 month and then 3 month interval, and PRN to support self-care for this chronic , progressive  condition. Elizabeth Martin has had a very sucessful OT outcome for lymphedema care  and is well equipped for optimal self management armed with a home program , improved understanding of her condition, new garments and a flexitouch device. We'll see her back next month to follow along.    OT Occupational Profile and History Detailed Assessment- Review of Records and additional review of physical, cognitive, psychosocial history related to current functional performance    Occupational performance deficits (Please refer to evaluation for details): ADL's;IADL's;Rest and Sleep;Work;Leisure;Social Participation    Body Structure / Function / Physical Skills ADL;Edema;Skin integrity;Pain;Gait;ROM;Decreased knowledge of precautions;Decreased knowledge of use of DME;IADL    Rehab Potential Good    Clinical Decision Making Several treatment options, min-mod task modification necessary    Comorbidities Affecting Occupational Performance: Presence of comorbidities impacting occupational performance    Modification or Assistance to Complete Evaluation  Min-Moderate modification of tasks or assist with assess necessary to complete eval    OT Frequency 2x / week    OT Duration 12 weeks    OT Treatment/Interventions Self-care/ADL training;Therapeutic exercise;Manual Therapy;Coping strategies training;Therapeutic activities;Manual lymph drainage;DME and/or AE instruction;Compression bandaging;Other (comment);Scar mobilization;Patient/family education   skin care throughout MLD to limit infection risk   Plan BLE/BLQ CDT including MLD, skin care, compression wrapping, ther ex, fitting with appropriate compression garments/ devices when optimal reduction achieved. Pt will benefit from advanced sequential ppneumatic compression device, Flexitouch, for long term self-managemnt of LE after Intensive Phase CDT compete. Set up clinical trial ASAP.    Consulted and Agree with Plan of Care Patient;Family member/caregiver             Patient will benefit from skilled therapeutic intervention in order to  improve the following deficits and impairments:   Body Structure / Function / Physical Skills: ADL, Edema, Skin integrity, Pain, Gait, ROM, Decreased knowledge of precautions, Decreased knowledge of use of DME, IADL       Visit Diagnosis: Lymphedema, not elsewhere classified    Problem List Patient Active Problem List   Diagnosis Date Noted   S/P closure of ileostomy 08/24/2022   Ileostomy in place [facility])  Colonic stricture ([facility]) 05/19/2022   Large bowel obstruction ([facility]) 05/19/2022   Hypokalemia 05/18/2022   Abdominal pain 05/18/2022   Gastroesophageal reflux disease 02/15/2020   Essential hypertension 08/09/2017   Asthma 06/04/1969    Andrey Spearman, MS, OTR/L, [facility] 10/06/22 9:23 AM   [place] MAIN [facility] SERVICES [address] [place], [place], [address] Phone: [phone]   Fax:  [phone]  Name: Xana Bradt MRN: [id number] Date of Birth: 1961/01/24

## 2022-10-13 ENCOUNTER — Ambulatory Visit: Payer: BC Managed Care – PPO | Admitting: Occupational Therapy

## 2022-10-20 ENCOUNTER — Ambulatory Visit: Payer: BC Managed Care – PPO | Admitting: Occupational Therapy

## 2022-10-20 ENCOUNTER — Encounter (HOSPITAL_COMMUNITY): Payer: Self-pay | Admitting: Nurse Practitioner

## 2022-10-27 ENCOUNTER — Ambulatory Visit: Payer: BC Managed Care – PPO | Admitting: Occupational Therapy

## 2022-11-03 ENCOUNTER — Ambulatory Visit: Payer: BC Managed Care – PPO | Admitting: Occupational Therapy

## 2022-11-05 ENCOUNTER — Emergency Department
Admission: EM | Admit: 2022-11-05 | Discharge: 2022-11-05 | Disposition: A | Discharge status: Home / Self Care | Payer: BC Managed Care – PPO | Attending: Emergency Medicine | Admitting: Emergency Medicine

## 2022-11-05 ENCOUNTER — Other Ambulatory Visit: Payer: Self-pay

## 2022-11-05 ENCOUNTER — Emergency Department: Payer: BC Managed Care – PPO

## 2022-11-05 DIAGNOSIS — Z1152 Encounter for screening for COVID-19: Secondary | ICD-10-CM | POA: Diagnosis not present

## 2022-11-05 DIAGNOSIS — M545 Low back pain, unspecified: Secondary | ICD-10-CM | POA: Diagnosis not present

## 2022-11-05 DIAGNOSIS — K5900 Constipation, unspecified: Secondary | ICD-10-CM | POA: Diagnosis not present

## 2022-11-05 DIAGNOSIS — R1030 Lower abdominal pain, unspecified: Secondary | ICD-10-CM | POA: Diagnosis present

## 2022-11-05 DIAGNOSIS — R944 Abnormal results of kidney function studies: Secondary | ICD-10-CM | POA: Insufficient documentation

## 2022-11-05 DIAGNOSIS — R197 Diarrhea, unspecified: Secondary | ICD-10-CM | POA: Diagnosis not present

## 2022-11-05 LAB — CBC
HCT: 37.7 % (ref 36.0–46.0)
Hemoglobin: 12.7 g/dL (ref 12.0–15.0)
MCH: 30.7 pg (ref 26.0–34.0)
MCHC: 33.7 g/dL (ref 30.0–36.0)
MCV: 91.1 fL (ref 80.0–100.0)
Platelets: 294 10*3/uL (ref 150–400)
RBC: 4.14 MIL/uL (ref 3.87–5.11)
RDW: 14 % (ref 11.5–15.5)
WBC: 7.1 10*3/uL (ref 4.0–10.5)
nRBC: 0 % (ref 0.0–0.2)

## 2022-11-05 LAB — URINALYSIS, ROUTINE W REFLEX MICROSCOPIC
Bilirubin Urine: NEGATIVE
Glucose, UA: NEGATIVE mg/dL
Hgb urine dipstick: NEGATIVE
Ketones, ur: NEGATIVE mg/dL
Leukocytes,Ua: NEGATIVE
Nitrite: NEGATIVE
Protein, ur: NEGATIVE mg/dL
Specific Gravity, Urine: 1.003 — ABNORMAL LOW (ref 1.005–1.030)
pH: 7 (ref 5.0–8.0)

## 2022-11-05 LAB — COMPREHENSIVE METABOLIC PANEL
ALT: 18 U/L (ref 0–44)
AST: 21 U/L (ref 15–41)
Albumin: 4.2 g/dL (ref 3.5–5.0)
Alkaline Phosphatase: 49 U/L (ref 38–126)
Anion gap: 12 (ref 5–15)
BUN: 20 mg/dL (ref 8–23)
CO2: 28 mmol/L (ref 22–32)
Calcium: 9.8 mg/dL (ref 8.9–10.3)
Chloride: 95 mmol/L — ABNORMAL LOW (ref 98–111)
Creatinine, Ser: 1.22 mg/dL — ABNORMAL HIGH (ref 0.44–1.00)
GFR, Estimated: 50 mL/min — ABNORMAL LOW (ref >60)
Glucose, Bld: 119 mg/dL — ABNORMAL HIGH (ref 70–99)
Potassium: 3.5 mmol/L (ref 3.5–5.1)
Sodium: 135 mmol/L (ref 135–145)
Total Bilirubin: 0.9 mg/dL (ref 0.3–1.2)
Total Protein: 7 g/dL (ref 6.5–8.1)

## 2022-11-05 LAB — LIPASE, BLOOD: Lipase: 41 U/L (ref 11–51)

## 2022-11-05 LAB — RESP PANEL BY RT-PCR (FLU A&B, COVID) ARPGX2
Influenza A by PCR: NEGATIVE
Influenza B by PCR: NEGATIVE
SARS Coronavirus 2 by RT PCR: NEGATIVE

## 2022-11-05 LAB — TROPONIN I (HIGH SENSITIVITY): Troponin I (High Sensitivity): 4 ng/L (ref <18)

## 2022-11-05 MED ORDER — SODIUM CHLORIDE 0.9 % IV BOLUS: 1000.0000 mL | Freq: Once | INTRAVENOUS | Status: DC

## 2022-11-05 MED ORDER — IOHEXOL 300 MG/ML  SOLN
100.0000 mL | Freq: Once | INTRAMUSCULAR | Status: AC | PRN
  Administered 2022-11-05: 100 mL via INTRAVENOUS

## 2022-11-05 MED ORDER — ONDANSETRON HCL 4 MG/2ML IJ SOLN
4.0000 mg | Freq: Once | INTRAMUSCULAR | Status: AC
  Administered 2022-11-05: 4 mg via INTRAVENOUS
  Filled 2022-11-05: qty 2

## 2022-11-05 MED ORDER — HYDROMORPHONE HCL 1 MG/ML IJ SOLN
0.5000 mg | Freq: Once | INTRAMUSCULAR | Status: AC
  Administered 2022-11-05: 0.5 mg via INTRAVENOUS
  Filled 2022-11-05: qty 0.5

## 2022-11-05 NOTE — Discharge Instructions (Addendum)
Stay well-hydrated with water, Pedialyte, Gatorade.  Use the MiraLAX to try to help produce a bowel movements.  Do not take any additional Imodium and return to the ER if develop worsening symptoms or any other concerns    Status post surgical resection of rectosigmoid region. Large amount of stool is noted in the right and transverse colon. However, there does not appear to be any definite evidence of distal colonic obstruction or inflammation.   Postsurgical changes are also seen involving the distal ileum with large amount of stool like material in the anastomotic site suggesting fecalization. However, there is no significant dilatation of the more proximal small bowel to suggest obstruction.

## 2022-11-05 NOTE — ED Provider Notes (Signed)
[facility] Provider Note    Event Date/Time   First MD Initiated Contact with Patient 11/05/22 1151     (approximate)   History   Abdominal Pain and Back Pain   HPI  Elizabeth Martin is a 61 y.o. female who comes in with lower abdominal pain and lower back pain for the past 3 days.  Patient does have a history of bowel resection with reversal approximately 8 weeks ago.  She does report some associated nausea, vomiting, diarrhea.  Patient reports that she did take some Imodium which has since helped the diarrhea.  She reports just having a lot of discomfort in her abdomen and her back.  She denies any chest pain, shortness of breath, falls or hitting her head or any other concerns  Physical Exam   Triage Vital Signs: ED Triage Vitals [11/05/22 1126]  Enc Vitals Group     BP 114/64     Pulse Rate 73     Resp 16     Temp 99 F (37.2 C)     Temp Source Oral     SpO2 99 %     Weight 160 lb (72.6 kg)     Height '5\' 7"'$  (1.702 m)     Head Circumference      Peak Flow      Pain Score 8     Pain Loc      Pain Edu?      Excl. in [place]?     Most recent vital signs: Vitals:   11/05/22 1126  BP: 114/64  Pulse: 73  Resp: 16  Temp: 99 F (37.2 C)  SpO2: 99%     General: Awake, no distress.  CV:  Good peripheral perfusion.  Resp:  Normal effort.  Abd:  No distention.  Tenderness throughout the abdomen Other:  Normal Strength in her legs.  No numbness or tingling in her legs   ED Results / Procedures / Treatments   Labs (all labs ordered are listed, but only abnormal results are displayed) Labs Reviewed  URINALYSIS, ROUTINE W REFLEX MICROSCOPIC - Abnormal; Notable for the following components:      Result Value   Color, Urine STRAW (*)    APPearance CLEAR (*)    Specific Gravity, Urine 1.003 (*)    All other components within normal limits  CBC  LIPASE, BLOOD  COMPREHENSIVE METABOLIC PANEL  TROPONIN I (HIGH SENSITIVITY)     EKG  My  interpretation of EKG:  Normal sinus rate 66 without any ST elevation, no T wave inversions, normal intervals  RADIOLOGY I have reviewed the CT abdominal personally and interpreted patient has stool noted  Status post surgical resection of rectosigmoid region. Large amount of stool is noted in the right and transverse colon. However, there does not appear to be any definite evidence of distal colonic obstruction or inflammation.   Postsurgical changes are also seen involving the distal ileum with large amount of stool like material in the anastomotic site suggesting fecalization. However, there is no significant dilatation of the more proximal small bowel to suggest obstruction.   PROCEDURES:  Critical Care performed: No  Procedures   MEDICATIONS ORDERED IN ED: Medications  sodium chloride 0.9 % bolus 1,000 mL (has no administration in time range)  HYDROmorphone (DILAUDID) injection 0.5 mg (0.5 mg Intravenous Given 11/05/22 1450)  ondansetron (ZOFRAN) injection 4 mg (4 mg Intravenous Given 11/05/22 1448)  iohexol (OMNIPAQUE) 300 MG/ML solution 100 mL (100 mLs Intravenous Contrast  Given 11/05/22 1524)     IMPRESSION / MDM / ASSESSMENT AND PLAN / ED COURSE  I reviewed the triage vital signs and the nursing notes.   Patient's presentation is most consistent with acute presentation with potential threat to life or bodily function.   Patient comes in with abdominal pain.  Given her recent surgical history CT scan ordered to evaluate for any evidence of obstruction, perforation, diverticulitis, appendicitis or other acute pathology.  Labs ordered evaluate for Electra abnormalities, AKI  COVID test was negative.  Lipase normal.  CMP shows slightly low chloride and slightly elevated creatinine.  Originally ordered IV fluids but they have not been started prior to patient CT coming back and at this time she declines IV fluids and would prefer to go home she is tolerating p.o. fine.  She  understands to increase oral hydration.  Her CBC is reassuring and her urine without evidence of UTI troponin was negative  IMPRESSION: Status post surgical resection of rectosigmoid region. Large amount of stool is noted in the right and transverse colon. However, there does not appear to be any definite evidence of distal colonic obstruction or inflammation.   Postsurgical changes are also seen involving the distal ileum with large amount of stool like material in the anastomotic site suggesting fecalization. However, there is no significant dilatation of the more proximal small bowel to suggest obstruction.   Aortic Atherosclerosis (ICD10-I70.0).    CT scan as as above.  Did discuss with Dr Dahlia Byes  they can try to help get her into Dr. Mont Dutton surgery outpatient next week and he agrees with MiraLAX and stool softeners.  I had a lengthy discussion with patient about the CT scan and holding off on further Imodium and starting on laxatives including MiraLAX and she expressed understanding she is going to increase oral hydration using MiraLAX and will follow-up outpatient with [facility] and return to the ER if she develops worsening symptoms or any other concerns  Considered admission but patient has no evidence of obstruction on CT scan she is got no vomiting and still passing gas therefore patient can do oral administration of MiraLAX at home to help with constipation       FINAL CLINICAL IMPRESSION(S) / ED DIAGNOSES   Final diagnoses:  Constipation, unspecified constipation type     Rx / DC Orders   ED Discharge Orders     None        Note:  This document was prepared using Dragon voice recognition software and may include unintentional dictation errors.   Vanessa Bottineau, MD 11/05/22 [phone]

## 2022-11-05 NOTE — ED Triage Notes (Signed)
Reports severe lower abd pain and lower back pain that started approx 3 days ago.  Reports has been seen at [facility] and came here and had blood work.  Reports 4 months ago how bowel resection and then reversal approx 8 weeks ago.  +n/v/d

## 2022-11-08 ENCOUNTER — Telehealth: Payer: Self-pay

## 2022-11-08 NOTE — Telephone Encounter (Signed)
-----   Message from Jules Husbands, MD sent at 11/05/2022  3:54 PM EST ----- Please have opt see Dr Hampton Abbot 11/08/22, Came to the ER over the weekend for abd pain, contipation

## 2022-11-08 NOTE — Telephone Encounter (Signed)
Spoke with the patient about being seen in the ER and to see if she wanted to follow up with Dr Hampton Abbot. She says that she is much better and that the constipation is gone. She will call us if she needs to be seen.

## 2022-11-10 ENCOUNTER — Ambulatory Visit: Payer: BC Managed Care – PPO | Admitting: Occupational Therapy

## 2022-11-10 DIAGNOSIS — M545 Low back pain, unspecified: Secondary | ICD-10-CM | POA: Insufficient documentation

## 2022-11-17 ENCOUNTER — Encounter: Payer: BC Managed Care – PPO | Admitting: Occupational Therapy

## 2022-12-04 ENCOUNTER — Other Ambulatory Visit: Payer: Self-pay

## 2022-12-04 ENCOUNTER — Emergency Department: Payer: BC Managed Care – PPO

## 2022-12-04 ENCOUNTER — Encounter: Payer: Self-pay | Admitting: Emergency Medicine

## 2022-12-04 ENCOUNTER — Emergency Department
Admission: EM | Admit: 2022-12-04 | Discharge: 2022-12-04 | Disposition: A | Discharge status: Home / Self Care | Payer: BC Managed Care – PPO | Attending: Emergency Medicine | Admitting: Emergency Medicine

## 2022-12-04 DIAGNOSIS — M4807 Spinal stenosis, lumbosacral region: Secondary | ICD-10-CM

## 2022-12-04 DIAGNOSIS — M25551 Pain in right hip: Secondary | ICD-10-CM | POA: Insufficient documentation

## 2022-12-04 DIAGNOSIS — N390 Urinary tract infection, site not specified: Secondary | ICD-10-CM

## 2022-12-04 DIAGNOSIS — E876 Hypokalemia: Secondary | ICD-10-CM | POA: Diagnosis not present

## 2022-12-04 DIAGNOSIS — Z20822 Contact with and (suspected) exposure to covid-19: Secondary | ICD-10-CM | POA: Diagnosis not present

## 2022-12-04 LAB — RESP PANEL BY RT-PCR (RSV, FLU A&B, COVID)  RVPGX2
Influenza A by PCR: NEGATIVE
Influenza B by PCR: NEGATIVE
Resp Syncytial Virus by PCR: NEGATIVE
SARS Coronavirus 2 by RT PCR: NEGATIVE

## 2022-12-04 LAB — COMPREHENSIVE METABOLIC PANEL
ALT: 42 U/L (ref 0–44)
AST: 42 U/L — ABNORMAL HIGH (ref 15–41)
Albumin: 4.3 g/dL (ref 3.5–5.0)
Alkaline Phosphatase: 46 U/L (ref 38–126)
Anion gap: 14 (ref 5–15)
BUN: 11 mg/dL (ref 8–23)
CO2: 23 mmol/L (ref 22–32)
Calcium: 10.2 mg/dL (ref 8.9–10.3)
Chloride: 100 mmol/L (ref 98–111)
Creatinine, Ser: 0.65 mg/dL (ref 0.44–1.00)
GFR, Estimated: >60 mL/min (ref >60)
Glucose, Bld: 177 mg/dL — ABNORMAL HIGH (ref 70–99)
Potassium: 2.7 mmol/L — CL (ref 3.5–5.1)
Sodium: 137 mmol/L (ref 135–145)
Total Bilirubin: 0.8 mg/dL (ref 0.3–1.2)
Total Protein: 7.6 g/dL (ref 6.5–8.1)

## 2022-12-04 LAB — URINALYSIS, ROUTINE W REFLEX MICROSCOPIC
Bilirubin Urine: NEGATIVE
Glucose, UA: NEGATIVE mg/dL
Ketones, ur: 5 mg/dL — AB
Leukocytes,Ua: NEGATIVE
Nitrite: NEGATIVE
Protein, ur: 100 mg/dL — AB
Specific Gravity, Urine: >1.046 — ABNORMAL HIGH (ref 1.005–1.030)
WBC, UA: >50 WBC/hpf — ABNORMAL HIGH (ref 0–5)
pH: 5 (ref 5.0–8.0)

## 2022-12-04 LAB — CBC
HCT: 42 % (ref 36.0–46.0)
Hemoglobin: 14.9 g/dL (ref 12.0–15.0)
MCH: 30.2 pg (ref 26.0–34.0)
MCHC: 35.5 g/dL (ref 30.0–36.0)
MCV: 85 fL (ref 80.0–100.0)
Platelets: 341 10*3/uL (ref 150–400)
RBC: 4.94 MIL/uL (ref 3.87–5.11)
RDW: 12.9 % (ref 11.5–15.5)
WBC: 8.8 10*3/uL (ref 4.0–10.5)
nRBC: 0 % (ref 0.0–0.2)

## 2022-12-04 LAB — MAGNESIUM: Magnesium: 2 mg/dL (ref 1.7–2.4)

## 2022-12-04 MED ORDER — ONDANSETRON HCL 4 MG/2ML IJ SOLN
4.0000 mg | Freq: Once | INTRAMUSCULAR | Status: AC
  Administered 2022-12-04: 4 mg via INTRAVENOUS
  Filled 2022-12-04: qty 2

## 2022-12-04 MED ORDER — POTASSIUM CHLORIDE CRYS ER 20 MEQ PO TBCR: 40.0000 meq | EXTENDED_RELEASE_TABLET | Freq: Once | ORAL | Status: DC

## 2022-12-04 MED ORDER — POTASSIUM CHLORIDE CRYS ER 20 MEQ PO TBCR: 20.0000 meq | EXTENDED_RELEASE_TABLET | Freq: Two times a day (BID) | ORAL | 0 refills | Status: DC

## 2022-12-04 MED ORDER — OXYCODONE-ACETAMINOPHEN 5-325 MG PO TABS
1.0000 | ORAL_TABLET | Freq: Once | ORAL | Status: AC
  Administered 2022-12-04: 1 via ORAL
  Filled 2022-12-04: qty 1

## 2022-12-04 MED ORDER — KETOROLAC TROMETHAMINE 15 MG/ML IJ SOLN
15.0000 mg | Freq: Once | INTRAMUSCULAR | Status: AC
  Administered 2022-12-04: 15 mg via INTRAVENOUS
  Filled 2022-12-04: qty 1

## 2022-12-04 MED ORDER — IOHEXOL 300 MG/ML  SOLN
100.0000 mL | Freq: Once | INTRAMUSCULAR | Status: AC | PRN
  Administered 2022-12-04: 100 mL via INTRAVENOUS

## 2022-12-04 MED ORDER — POTASSIUM CHLORIDE 10 MEQ/100ML IV SOLN
10.0000 meq | Freq: Once | INTRAVENOUS | Status: AC
  Administered 2022-12-04: 10 meq via INTRAVENOUS
  Filled 2022-12-04: qty 100

## 2022-12-04 MED ORDER — CEPHALEXIN 500 MG PO CAPS: 500.0000 mg | ORAL_CAPSULE | Freq: Two times a day (BID) | ORAL | 0 refills | Status: AC

## 2022-12-04 MED ORDER — SODIUM CHLORIDE 0.9 % IV BOLUS
1000.0000 mL | Freq: Once | INTRAVENOUS | Status: AC
  Administered 2022-12-04: 1000 mL via INTRAVENOUS

## 2022-12-04 MED ORDER — GABAPENTIN 100 MG PO CAPS
100.0000 mg | ORAL_CAPSULE | ORAL | Status: AC
  Administered 2022-12-04: 100 mg via ORAL
  Filled 2022-12-04: qty 1

## 2022-12-04 MED ORDER — POTASSIUM CHLORIDE CRYS ER 20 MEQ PO TBCR
40.0000 meq | EXTENDED_RELEASE_TABLET | Freq: Once | ORAL | Status: AC
  Administered 2022-12-04: 40 meq via ORAL
  Filled 2022-12-04: qty 2

## 2022-12-04 MED ORDER — OXYCODONE-ACETAMINOPHEN 5-325 MG PO TABS: 1.0000 | ORAL_TABLET | Freq: Four times a day (QID) | ORAL | 0 refills | Status: DC | PRN

## 2022-12-04 MED ORDER — MORPHINE SULFATE (PF) 4 MG/ML IV SOLN
4.0000 mg | Freq: Once | INTRAVENOUS | Status: AC
  Administered 2022-12-04: 4 mg via INTRAVENOUS
  Filled 2022-12-04: qty 1

## 2022-12-04 MED ORDER — SODIUM CHLORIDE 0.9 % IV SOLN
1.0000 g | Freq: Once | INTRAVENOUS | Status: AC
  Administered 2022-12-04: 1 g via INTRAVENOUS
  Filled 2022-12-04: qty 10

## 2022-12-04 NOTE — ED Provider Notes (Signed)
Patient requested we share images with [facility] hospitals of her MRI for her appointment.  I have discussed with our 6 radiology tech and they will PowerShare her images to Trinna Balloon, MD 12/04/22 1732

## 2022-12-04 NOTE — ED Triage Notes (Addendum)
Pt here with right sided hip pain and N/V. Pt states it is not on the side of her hips but more where she sits down. Pt denies fall and can still ambulate. Pt also c/o body aches and N/V x24 hours. Pt denies fevers.

## 2022-12-04 NOTE — ED Notes (Signed)
Pt to CT now. Fluids paused. Will start IV potassium and restart fluid bolus when pt returns.

## 2022-12-04 NOTE — ED Provider Notes (Signed)
[facility] [place] Provider Note    Event Date/Time   First MD Initiated Contact with Patient 12/04/22 [phone]     (approximate)   History   Hip Pain   HPI  Elizabeth Martin is a 61 y.o. female with past medical history significant for mixed connective tissue disease, who presents to the emergency department with right hip pain.  Patient states that she has been having mild right hip pain over the past couple of weeks.  She has been evaluated in the emergency department, urgent care, spine surgeon, rheumatologist, primary care provider -states that she has had mild pain but it has acutely worsened over the past day.  Up all night last night with multiple episodes of vomiting.  States that she no longer has severe vomiting but since that time she has severe pain to her right hip.  Able to ambulate but having pain that is worse with ambulation.  No pain with palpation.  Denies any falls or trauma.  Does endorse dysuria with urinary urgency and frequency that has been occurring over the past 2 days.  Denies history of kidney stones.     Physical Exam   Triage Vital Signs: ED Triage Vitals  Enc Vitals Group     BP 12/04/22 0702 (!) 142/93     Pulse Rate 12/04/22 0702 (!) 101     Resp 12/04/22 0702 19     Temp 12/04/22 0702 (!) 97.5 F (36.4 C)     Temp Source 12/04/22 0702 Oral     SpO2 12/04/22 0702 99 %     Weight 12/04/22 0706 160 lb 0.9 oz (72.6 kg)     Height 12/04/22 0706 '5\' 7"'$  (1.702 m)     Head Circumference --      Peak Flow --      Pain Score 12/04/22 0706 8     Pain Loc --      Pain Edu? --      Excl. in [place]? --     Most recent vital signs: Vitals:   12/04/22 1455 12/04/22 1514  BP: 130/78 133/78  Pulse: 70 68  Resp:  18  Temp:  98.1 F (36.7 C)  SpO2: 96% 97%    Physical Exam Constitutional:      Appearance: She is well-developed.  HENT:     Head: Atraumatic.  Eyes:     Conjunctiva/sclera: Conjunctivae normal.  Cardiovascular:     Rate  and Rhythm: Regular rhythm.  Pulmonary:     Effort: No respiratory distress.  Abdominal:     General: There is no distension.  Musculoskeletal:        General: Normal range of motion.     Cervical back: Normal range of motion.     Comments: Full range of motion of the right hip with mild pain.  No tenderness to palp patient  Skin:    General: Skin is warm.  Neurological:     Mental Status: She is alert. Mental status is at baseline.     Comments: 5/5 strength bilateral lower extremities.  No saddle anesthesia.     IMPRESSION / MDM / ASSESSMENT AND PLAN / ED COURSE  I reviewed the triage vital signs and the nursing notes.  Differential diagnosis including kidney stones, pyelonephritis, urinary tract infection, musculoskeletal, muscle tear, radiculopathy  On chart review of her outside records patient was evaluated by spine surgeon in the next plan was to get an MRI of the pelvis if symptoms worsened or  continued.    RADIOLOGY I independently reviewed imaging, my interpretation of imaging: X-ray of the hip showed degenerative changes but no acute fracture or dislocation, no significant change when compared to prior.  CT scan showed no acute findings.  LABS (all labs ordered are listed, but only abnormal results are displayed) Labs interpreted as -   Findings of urinary tract infection.  Potassium low at 2.7.  Given IV replacement.  Magnesium level within normal limits.  COVID and influenza testing are negative.  Given the patient does have symptoms of urinary tract infection we will start treatment and send a urine culture.  On chart review no history of a resistant urinary tract infection. Labs Reviewed  COMPREHENSIVE METABOLIC PANEL - Abnormal; Notable for the following components:      Result Value   Potassium 2.7 (*)    Glucose, Bld 177 (*)    AST 42 (*)    All other components within normal limits  URINALYSIS, ROUTINE W REFLEX MICROSCOPIC - Abnormal; Notable for the  following components:   Color, Urine YELLOW (*)    APPearance HAZY (*)    Specific Gravity, Urine >1.046 (*)    Hgb urine dipstick SMALL (*)    Ketones, ur 5 (*)    Protein, ur 100 (*)    WBC, UA >50 (*)    Bacteria, UA RARE (*)    All other components within normal limits  RESP PANEL BY RT-PCR (RSV, FLU A&B, COVID)  [id number]  URINE CULTURE  CBC  MAGNESIUM   Patient is able to range the right hip and no fever have a low suspicion for septic hip.  TREATMENT    IV fluids, IV Zofran, IV morphine, Percocet, IV Rocephin  PROCEDURES:  Critical Care performed: No  Procedures  Patient's presentation is most consistent with acute presentation with potential threat to life or bodily function.   MEDICATIONS ORDERED IN ED: Medications  cefTRIAXone (ROCEPHIN) 1 g in sodium chloride 0.9 % 100 mL IVPB (1 g Intravenous New Bag/Given 12/04/22 1512)  potassium chloride SA (KLOR-CON M) CR tablet 40 mEq (has no administration in time range)  ketorolac (TORADOL) 15 MG/ML injection 15 mg (15 mg Intravenous Given 12/04/22 0943)  sodium chloride 0.9 % bolus 1,000 mL (0 mLs Intravenous Stopped 12/04/22 1147)  ondansetron (ZOFRAN) injection 4 mg (4 mg Intravenous Given 12/04/22 0944)  potassium chloride 10 mEq in 100 mL IVPB (0 mEq Intravenous Stopped 12/04/22 1147)  iohexol (OMNIPAQUE) 300 MG/ML solution 100 mL (100 mLs Intravenous Contrast Given 12/04/22 0954)  morphine (PF) 4 MG/ML injection 4 mg (4 mg Intravenous Given 12/04/22 1126)  ondansetron (ZOFRAN) injection 4 mg (4 mg Intravenous Given 12/04/22 1127)  oxyCODONE-acetaminophen (PERCOCET/ROXICET) 5-325 MG per tablet 1 tablet (1 tablet Oral Given 12/04/22 1310)  ondansetron (ZOFRAN) injection 4 mg (4 mg Intravenous Given 12/04/22 1314)  oxyCODONE-acetaminophen (PERCOCET/ROXICET) 5-325 MG per tablet 1 tablet (1 tablet Oral Given 12/04/22 1510)    FINAL CLINICAL IMPRESSION(S) / ED DIAGNOSES   Final diagnoses:  Right hip pain  Urinary  tract infection without hematuria, site unspecified  Hypokalemia     Rx / DC Orders   ED Discharge Orders          Ordered    cephALEXin (KEFLEX) 500 MG capsule  2 times daily        12/04/22 1521    potassium chloride SA (KLOR-CON M) 20 MEQ tablet  2 times daily        12/04/22 1524  Note:  This document was prepared using Dragon voice recognition software and may include unintentional dictation errors.   Nathaniel Man, MD 12/04/22 1525

## 2022-12-04 NOTE — ED Triage Notes (Signed)
Pt to ED via POV, states emesis x 1 day. Pt also c/o severe R hip pain at this time. Pt states pain has been progressively worsening.

## 2022-12-04 NOTE — ED Notes (Signed)
Pt refused PO meds at this time because is nauseated after PO percocet.

## 2022-12-04 NOTE — ED Provider Notes (Signed)
----------------------------------------- 3:17 PM on 12/04/2022 -----------------------------------------  Blood pressure 133/78, pulse 68, temperature 98.1 F (36.7 C), temperature source Oral, resp. rate 18, height '5\' 7"'$  (1.702 m), weight 72.6 kg, SpO2 97 %.  Assuming care from Dr. Jori Moll.  In short, Elizabeth Martin is a 61 y.o. female with a chief complaint of Hip Pain .  Refer to the original H&P for additional details.  The current plan of care is to follow-up on the results of a pelvic MRI, if this is without acute pathology would appear the patient should continue with close outpatient treatment which she has been receiving for further causation as to etiology of the pain she is experiencing in her near the right hip.  UTI treated with Rocephin and prescription for cephalexin MRI   MR PELVIS WO CONTRAST  Result Date: 12/04/2022 CLINICAL DATA:  Osteomyelitis suspected. Right hip pain ongoing and worsening. Mixed connective tissue disease. No known trauma. Dysuria with urinary urgency and frequency over the past 2 days. EXAM: MRI PELVIS WITHOUT CONTRAST TECHNIQUE: Multiplanar multisequence MR imaging of the pelvis was performed. No intravenous contrast was administered. COMPARISON:  CT abdomen and pelvis 12/04/2022; pelvis and right hip radiographs 12/04/2022 FINDINGS: Urinary Tract:  No abnormality visualized. Bowel: Pelvic distal small bowel and rectosigmoid postsurgical changes, better seen on today's CT. Vascular/Lymphatic: No pathologically enlarged lymph nodes. No significant vascular abnormality seen. Reproductive: The uterus is surgically absent. No adnexal mass is seen. Other: Moderate severe L5-S1, mild L4-5, and mild-to-moderate L3-4 degenerative disc changes. Mild retrolisthesis of L5 on S1 with mild broad-based posterior disc bulge. Mild-to-moderate bilateral L5-S1 and mild bilateral L4-5 neuroforaminal stenosis. Musculoskeletal: Moderate pubic symphysis joint space narrowing and  peripheral osteophytosis. Mild to moderate bilateral superior femoroacetabular cartilage thinning. There is mild scattered decreased T1 increased T2 signal within the anterior superior right femoral head-neck junction, likely degenerative subchondral cysts. No definite serpiginous signal indicates avascular necrosis. No acute fracture. No cortical erosion. The origins of the bilateral sartorius, rectus femoris, and common hamstring tendons are intact. The insertions of the bilateral gluteus minimus, gluteus medius, and iliopsoas tendons are intact. The rectus abdominis-adductor aponeuroses are intact. Normal muscle size and intensity. IMPRESSION: 1. No evidence of acute osteomyelitis. 2. Moderate pubic symphysis osteoarthritis. 3. 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 4. No avascular necrosis. No acute fracture. 5. Degenerative disc changes within the lower lumbar spine with mild-to-moderate bilateral L5-S1 and mild bilateral L4-5 neuroforaminal stenosis. Electronically Signed   By: Yvonne Kendall M.D.   On: 12/04/2022 17:11   CT ABDOMEN PELVIS W CONTRAST  Result Date: 12/04/2022 CLINICAL DATA:  61 year old female with acute RIGHT abdominal and pelvic pain. EXAM: CT ABDOMEN AND PELVIS WITH CONTRAST TECHNIQUE: Multidetector CT imaging of the abdomen and pelvis was performed using the standard protocol following bolus administration of intravenous contrast. RADIATION DOSE REDUCTION: This exam was performed according to the departmental dose-optimization program which includes automated exposure control, adjustment of the mA and/or kV according to patient size and/or use of iterative reconstruction technique. CONTRAST:  142m OMNIPAQUE IOHEXOL 300 MG/ML  SOLN COMPARISON:  11/05/2022 CT and prior studies FINDINGS: Lower chest: No acute abnormality. Hepatobiliary: The liver is unremarkable. The patient is status post cholecystectomy. Mild intrahepatic biliary and CBD fullness is  slightly improved and likely related to cholecystectomy. Pancreas: Unremarkable Spleen: Unremarkable Adrenals/Urinary Tract: No significant renal, adrenal or bladder abnormalities identified. Stomach/Bowel: There is no evidence of bowel obstruction, pneumoperitoneum or definite bowel wall thickening. A small hiatal hernia is again noted. The appendix is  normal. Distal small bowel and colonic surgical changes are again identified. Vascular/Lymphatic: Aortic atherosclerosis. No enlarged abdominal or pelvic lymph nodes. Reproductive: Status post hysterectomy. No adnexal masses. Other: No ascites, focal collection or pneumoperitoneum. Surgical changes within the anterior pelvic wall/subcutaneous tissues again noted. Musculoskeletal: No acute or suspicious bony abnormalities are noted. Degenerative disc disease at L5-S1 again noted. IMPRESSION: 1. No evidence of acute abnormality. 2. Small hiatal hernia. 3.  Aortic Atherosclerosis (ICD10-I70.0). Electronically Signed   By: Margarette Canada M.D.   On: 12/04/2022 10:21   DG Hip Unilat W or Wo Pelvis 2-3 Views Right  Result Date: 12/04/2022 CLINICAL DATA:  Right hip pain.  No recent injury. EXAM: DG HIP (WITH OR WITHOUT PELVIS) 2-3V RIGHT COMPARISON:  CT abdomen and pelvis and reconstructed images of 11/05/2022 FINDINGS: There is mild joint space loss of both hips and trace femoral head spurring. No pelvic fracture or diastasis or hip fracture are seen. There is mild pelvic enthesopathy. Intestinal postsurgical changes are again noted in the pelvis. IMPRESSION: Mild degenerative change without evidence of fractures. Mild pelvic enthesopathy. Electronically Signed   By: Telford Nab M.D.   On: 12/04/2022 07:44        The patient's areas of neuroforaminal stenosis seen on MRI seem to correlate with a dermatome of L5-S1 on the right which seems to be the area of the patient experiencing pain.  She ranges her hip joint quite well without pain and I clinically doubt it  is actually her hip joint causing pain.  I suspect this may be from neuroforaminal stenosis  She has an upcoming appointment with [facility] spinal surgery on January 3 which she will keep.  In the interim we will prescribe pain medication including oxycodone for which we reviewed no driving, careful use of, and she will trial increasing her gabapentin to twice daily instead of just in the evening as well.  She already has this pre-existing prescription.  Discussed again precautions not driving while taking, careful use and using prescriptions only as recommended by physician  Return precautions discussed.  Patient in agreement.  Return precautions and treatment recommendations and follow-up discussed with the patient who is agreeable with the plan.    Delman Kitten, MD 12/04/22 1730

## 2022-12-04 NOTE — ED Notes (Signed)
Pt discharged by other provider.

## 2022-12-06 LAB — URINE CULTURE: Culture: 50000 — AB

## 2022-12-08 ENCOUNTER — Encounter: Payer: BC Managed Care – PPO | Admitting: Occupational Therapy

## 2022-12-10 DIAGNOSIS — R202 Paresthesia of skin: Secondary | ICD-10-CM | POA: Insufficient documentation

## 2022-12-10 DIAGNOSIS — C4491 Basal cell carcinoma of skin, unspecified: Secondary | ICD-10-CM | POA: Insufficient documentation

## 2022-12-10 DIAGNOSIS — M2550 Pain in unspecified joint: Secondary | ICD-10-CM | POA: Insufficient documentation

## 2022-12-10 DIAGNOSIS — R232 Flushing: Secondary | ICD-10-CM | POA: Insufficient documentation

## 2022-12-10 DIAGNOSIS — F5101 Primary insomnia: Secondary | ICD-10-CM | POA: Insufficient documentation

## 2022-12-10 DIAGNOSIS — K5790 Diverticulosis of intestine, part unspecified, without perforation or abscess without bleeding: Secondary | ICD-10-CM | POA: Insufficient documentation

## 2022-12-10 DIAGNOSIS — M79604 Pain in right leg: Secondary | ICD-10-CM | POA: Insufficient documentation

## 2022-12-10 DIAGNOSIS — E041 Nontoxic single thyroid nodule: Secondary | ICD-10-CM | POA: Insufficient documentation

## 2023-01-03 DIAGNOSIS — R609 Edema, unspecified: Secondary | ICD-10-CM | POA: Insufficient documentation

## 2023-01-11 ENCOUNTER — Ambulatory Visit: Payer: BC Managed Care – PPO | Attending: Family Medicine | Admitting: Occupational Therapy

## 2023-02-11 ENCOUNTER — Other Ambulatory Visit: Payer: Self-pay

## 2023-02-11 ENCOUNTER — Inpatient Hospital Stay
Admission: EM | Admit: 2023-02-11 | Discharge: 2023-02-13 | DRG: 390 | Disposition: A | Discharge status: Home / Self Care | Payer: BC Managed Care – PPO | Attending: Internal Medicine | Admitting: Internal Medicine

## 2023-02-11 ENCOUNTER — Emergency Department: Payer: BC Managed Care – PPO

## 2023-02-11 ENCOUNTER — Inpatient Hospital Stay: Payer: BC Managed Care – PPO

## 2023-02-11 ENCOUNTER — Encounter: Payer: Self-pay | Admitting: Intensive Care

## 2023-02-11 DIAGNOSIS — I7300 Raynaud's syndrome without gangrene: Secondary | ICD-10-CM | POA: Diagnosis present

## 2023-02-11 DIAGNOSIS — Z885 Allergy status to narcotic agent status: Secondary | ICD-10-CM

## 2023-02-11 DIAGNOSIS — R109 Unspecified abdominal pain: Secondary | ICD-10-CM | POA: Diagnosis present

## 2023-02-11 DIAGNOSIS — I10 Essential (primary) hypertension: Secondary | ICD-10-CM | POA: Diagnosis present

## 2023-02-11 DIAGNOSIS — I700 Atherosclerosis of aorta: Secondary | ICD-10-CM | POA: Diagnosis present

## 2023-02-11 DIAGNOSIS — Z85828 Personal history of other malignant neoplasm of skin: Secondary | ICD-10-CM

## 2023-02-11 DIAGNOSIS — Z91040 Latex allergy status: Secondary | ICD-10-CM

## 2023-02-11 DIAGNOSIS — K219 Gastro-esophageal reflux disease without esophagitis: Secondary | ICD-10-CM | POA: Diagnosis present

## 2023-02-11 DIAGNOSIS — Z932 Ileostomy status: Secondary | ICD-10-CM

## 2023-02-11 DIAGNOSIS — Z9049 Acquired absence of other specified parts of digestive tract: Secondary | ICD-10-CM | POA: Diagnosis not present

## 2023-02-11 DIAGNOSIS — Z79899 Other long term (current) drug therapy: Secondary | ICD-10-CM

## 2023-02-11 DIAGNOSIS — Z90710 Acquired absence of both cervix and uterus: Secondary | ICD-10-CM

## 2023-02-11 DIAGNOSIS — M2550 Pain in unspecified joint: Secondary | ICD-10-CM | POA: Diagnosis not present

## 2023-02-11 DIAGNOSIS — Z888 Allergy status to other drugs, medicaments and biological substances status: Secondary | ICD-10-CM | POA: Diagnosis not present

## 2023-02-11 DIAGNOSIS — R1030 Lower abdominal pain, unspecified: Secondary | ICD-10-CM | POA: Diagnosis not present

## 2023-02-11 DIAGNOSIS — J45909 Unspecified asthma, uncomplicated: Secondary | ICD-10-CM | POA: Diagnosis present

## 2023-02-11 DIAGNOSIS — Z1152 Encounter for screening for COVID-19: Secondary | ICD-10-CM | POA: Diagnosis not present

## 2023-02-11 DIAGNOSIS — K56609 Unspecified intestinal obstruction, unspecified as to partial versus complete obstruction: Secondary | ICD-10-CM | POA: Diagnosis present

## 2023-02-11 DIAGNOSIS — Z9889 Other specified postprocedural states: Secondary | ICD-10-CM | POA: Diagnosis present

## 2023-02-11 LAB — CBC
HCT: 42.2 % (ref 36.0–46.0)
Hemoglobin: 13.6 g/dL (ref 12.0–15.0)
MCH: 29.4 pg (ref 26.0–34.0)
MCHC: 32.2 g/dL (ref 30.0–36.0)
MCV: 91.3 fL (ref 80.0–100.0)
Platelets: 342 10*3/uL (ref 150–400)
RBC: 4.62 MIL/uL (ref 3.87–5.11)
RDW: 14.5 % (ref 11.5–15.5)
WBC: 11.5 10*3/uL — ABNORMAL HIGH (ref 4.0–10.5)
nRBC: 0 % (ref 0.0–0.2)

## 2023-02-11 LAB — COMPREHENSIVE METABOLIC PANEL
ALT: 17 U/L (ref 0–44)
AST: 22 U/L (ref 15–41)
Albumin: 4.1 g/dL (ref 3.5–5.0)
Alkaline Phosphatase: 74 U/L (ref 38–126)
Anion gap: 8 (ref 5–15)
BUN: 13 mg/dL (ref 8–23)
CO2: 25 mmol/L (ref 22–32)
Calcium: 9.3 mg/dL (ref 8.9–10.3)
Chloride: 103 mmol/L (ref 98–111)
Creatinine, Ser: 0.56 mg/dL (ref 0.44–1.00)
GFR, Estimated: >60 mL/min (ref >60)
Glucose, Bld: 114 mg/dL — ABNORMAL HIGH (ref 70–99)
Potassium: 4 mmol/L (ref 3.5–5.1)
Sodium: 136 mmol/L (ref 135–145)
Total Bilirubin: 0.6 mg/dL (ref 0.3–1.2)
Total Protein: 7.3 g/dL (ref 6.5–8.1)

## 2023-02-11 LAB — URINALYSIS, ROUTINE W REFLEX MICROSCOPIC
Bilirubin Urine: NEGATIVE
Glucose, UA: NEGATIVE mg/dL
Hgb urine dipstick: NEGATIVE
Ketones, ur: NEGATIVE mg/dL
Leukocytes,Ua: NEGATIVE
Nitrite: NEGATIVE
Protein, ur: NEGATIVE mg/dL
Specific Gravity, Urine: 1.017 (ref 1.005–1.030)
pH: 5 (ref 5.0–8.0)

## 2023-02-11 LAB — RESP PANEL BY RT-PCR (RSV, FLU A&B, COVID)  RVPGX2
Influenza A by PCR: NEGATIVE
Influenza B by PCR: NEGATIVE
Resp Syncytial Virus by PCR: NEGATIVE
SARS Coronavirus 2 by RT PCR: NEGATIVE

## 2023-02-11 LAB — LIPASE, BLOOD: Lipase: 83 U/L — ABNORMAL HIGH (ref 11–51)

## 2023-02-11 MED ORDER — LORAZEPAM 2 MG/ML IJ SOLN: 0.5000 mg | Freq: Four times a day (QID) | INTRAMUSCULAR | Status: DC | PRN

## 2023-02-11 MED ORDER — METOCLOPRAMIDE HCL 5 MG/ML IJ SOLN
10.0000 mg | Freq: Once | INTRAMUSCULAR | Status: AC
  Administered 2023-02-11: 10 mg via INTRAVENOUS
  Filled 2023-02-11: qty 2

## 2023-02-11 MED ORDER — IOHEXOL 300 MG/ML  SOLN
100.0000 mL | Freq: Once | INTRAMUSCULAR | Status: AC | PRN
  Administered 2023-02-11: 100 mL via INTRAVENOUS

## 2023-02-11 MED ORDER — SORBITOL 70 % SOLN
400.0000 mL | TOPICAL_OIL | Freq: Once | ORAL | Status: DC
  Filled 2023-02-11 (×2): qty 120

## 2023-02-11 MED ORDER — SODIUM CHLORIDE 0.9 % IV SOLN: 12.5000 mg | Freq: Four times a day (QID) | INTRAVENOUS | Status: DC | PRN

## 2023-02-11 MED ORDER — LACTATED RINGERS IV SOLN: INTRAVENOUS | Status: AC

## 2023-02-11 MED ORDER — HYDROMORPHONE HCL 1 MG/ML IJ SOLN: 0.5000 mg | INTRAMUSCULAR | Status: DC | PRN

## 2023-02-11 MED ORDER — MORPHINE SULFATE (PF) 4 MG/ML IV SOLN
4.0000 mg | INTRAVENOUS | Status: DC | PRN
  Administered 2023-02-11 (×2): 4 mg via INTRAVENOUS
  Filled 2023-02-11 (×2): qty 1

## 2023-02-11 MED ORDER — SODIUM CHLORIDE 0.9 % IV BOLUS
1000.0000 mL | Freq: Once | INTRAVENOUS | Status: AC
  Administered 2023-02-11: 1000 mL via INTRAVENOUS

## 2023-02-11 MED ORDER — HEPARIN SODIUM (PORCINE) 5000 UNIT/ML IJ SOLN
5000.0000 [IU] | Freq: Three times a day (TID) | INTRAMUSCULAR | Status: DC
  Administered 2023-02-11 – 2023-02-13 (×4): 5000 [IU] via SUBCUTANEOUS
  Filled 2023-02-11 (×5): qty 1

## 2023-02-11 MED ORDER — PANTOPRAZOLE SODIUM 40 MG IV SOLR
40.0000 mg | Freq: Two times a day (BID) | INTRAVENOUS | Status: AC
  Administered 2023-02-11 – 2023-02-12 (×4): 40 mg via INTRAVENOUS
  Filled 2023-02-11 (×4): qty 10

## 2023-02-11 MED ORDER — HYDRALAZINE HCL 20 MG/ML IJ SOLN: 5.0000 mg | Freq: Four times a day (QID) | INTRAMUSCULAR | Status: DC | PRN

## 2023-02-11 MED ORDER — ACETAMINOPHEN 10 MG/ML IV SOLN: 1000.0000 mg | Freq: Four times a day (QID) | INTRAVENOUS | Status: AC | PRN

## 2023-02-11 MED ORDER — ONDANSETRON HCL 4 MG/2ML IJ SOLN
4.0000 mg | Freq: Four times a day (QID) | INTRAMUSCULAR | Status: DC | PRN
  Administered 2023-02-11 – 2023-02-13 (×3): 4 mg via INTRAVENOUS
  Filled 2023-02-11 (×3): qty 2

## 2023-02-11 MED ORDER — MORPHINE SULFATE (PF) 2 MG/ML IV SOLN
2.0000 mg | INTRAVENOUS | Status: DC | PRN
  Administered 2023-02-11: 2 mg via INTRAVENOUS
  Filled 2023-02-11: qty 1

## 2023-02-11 MED ORDER — ONDANSETRON HCL 4 MG PO TABS: 4.0000 mg | ORAL_TABLET | Freq: Four times a day (QID) | ORAL | Status: DC | PRN

## 2023-02-11 NOTE — Assessment & Plan Note (Signed)
Protonix 40 mg IV twice daily, 2 days ordered

## 2023-02-11 NOTE — Progress Notes (Signed)
PATIENT ARRIVED TO THE UNIT AWAKE, NAUSEOUS WITH NG TUBE. IV TO RFA. TELE PACK SET UP.  Elizabeth Martin [name]

## 2023-02-11 NOTE — Consult Note (Signed)
[facility] SURGICAL CONSULTATION NOTE (initial) - [id number]   HISTORY OF PRESENT ILLNESS (HPI):  62 y.o. female presented to [facility] ED today for evaluation of abdominal pain. Patient reports around a 24 hour history of generalized abdominal pain, somewhat crampy in nature, and had been worsening since the onset. She endorses associated nausea and emesis with this. No fever, chills, cough, CP, SOB, or urinary changes. She does endorse passing some flatus this AM and has had what she thought were regular BM as of lately. Of note, she is well known to our service following robotic assisted laparoscopic sigmoid colectomy and diverting loop ileostomy for sigmoid stricture in June 2023 with Dr Hampton Abbot. As well as subsequent ileostomy takedown in September 2023. She has recovered well from this. Work up in the ED revealed a mild leukocytosis to 11.5K, normal Hgb to 13.6, normal renal function with sCr - 0.56, No electrolyte derangements. She did get CT Abdomen/Pelvis which was concerning for small bowel dilation with degree of fecalization, patent small bowel anastomosis, and significant stool burden throughout colon. She was admitted to the medicine service. NGT was placed in ED.   Surgery is consulted by emergency medicine physician Dr. Merlyn Lot, MD in this context for evaluation and management of possible SBO.   PAST MEDICAL HISTORY (PMH):  Past Medical History:  Diagnosis Date   Asthma    Autoimmune disease ([place])    Cancer ([place])    Endometrial lymph node removal (30)   Corneal abrasion, right 05/24/2022   GERD (gastroesophageal reflux disease)    H/O blood clots    upper rt groin and behind both knees   History of hiatal hernia    Hypertension    Lymphedema    right leg   Morphea scleroderma    PONV (postoperative nausea and vomiting)    states gets violently ill     PAST SURGICAL HISTORY ([place]):  Past Surgical History:  Procedure Laterality Date   BASAL CELL  CARCINOMA EXCISION Left    CHOLECYSTECTOMY  2008   FLEXIBLE SIGMOIDOSCOPY N/A 05/19/2022   Procedure: FLEXIBLE SIGMOIDOSCOPY;  Surgeon: Toledo, Benay Pike, MD;  Location: [facility] ENDOSCOPY;  Service: Gastroenterology;  Laterality: N/A;   HERNIA REPAIR  2004   ILEOSTOMY CLOSURE N/A 08/24/2022   Procedure: ILEOSTOMY TAKEDOWN, open loop with Edison Simon, PA-C to assist;  Surgeon: Olean Ree, MD;  Location: [facility] ORS;  Service: General;  Laterality: N/A;   PLANTAR FASCIECTOMY     ROBOTIC ASSISTED TOTAL HYSTERECTOMY WITH BILATERAL SALPINGO OOPHERECTOMY  02/14/2012   ROTATOR CUFF REPAIR Left 03/24/2022   TONSILLECTOMY     XI ROBOTIC ASSISTED LOWER ANTERIOR RESECTION N/A 05/24/2022   Procedure: XI ROBOTIC ASSISTED LOWER ANTERIOR RESECTION;  Surgeon: Olean Ree, MD;  Location: [facility] ORS;  Service: General;  Laterality: N/A;     MEDICATIONS:  Prior to Admission medications   Medication Sig Start Date End Date Taking? Authorizing Provider  desonide (DESOWEN) 0.05 % cream Apply 1 Application topically 2 (two) times daily. 01/31/23  Yes [provider]  gabapentin (NEURONTIN) 300 MG capsule Take 600 mg by mouth 4 (four) times daily. 02/08/23  Yes [provider]  hydrOXYzine (ATARAX) 25 MG tablet Take 25 mg by mouth at bedtime as needed for itching. 01/31/23  Yes [provider]  KLOR-CON M10 10 MEQ tablet Take 10 mEq by mouth daily. 12/07/22  Yes [provider]  lisinopril (ZESTRIL) 40 MG tablet Take 40 mg by mouth daily. 12/29/22  Yes [provider]  omeprazole (PRILOSEC) 40 MG capsule Take 40 mg by mouth daily. 12/29/22  Yes [provider]  ondansetron (ZOFRAN-ODT) 8 MG disintegrating tablet Take 8 mg by mouth every 8 (eight) hours as needed. 12/29/22  Yes [provider]  pregabalin (LYRICA) 100 MG capsule Take 100 mg by mouth 3 (three) times daily. 02/10/23  Yes [provider]  triamcinolone (KENALOG) 0.025 % cream Apply 1  Application topically 2 (two) times daily. 12/15/22  Yes [provider]  albuterol (VENTOLIN HFA) 108 (90 Base) MCG/ACT inhaler Inhale 2 puffs into the lungs every 6 (six) hours as needed. 01/11/22   [provider]  celecoxib (CELEBREX) 100 MG capsule Take 100 mg by mouth 2 (two) times daily as needed for moderate pain. 04/28/22   [provider]  cetirizine (ZYRTEC) 10 MG tablet Take 10 mg by mouth daily as needed. 08/10/21   [provider]  chlorthalidone (HYGROTON) 50 MG tablet Take 50 mg by mouth every morning. 11/08/21   [provider]  cyclobenzaprine (FLEXERIL) 5 MG tablet Take 5 mg by mouth at bedtime as needed.    [provider]  DULoxetine (CYMBALTA) 30 MG capsule Take 90 mg by mouth daily. 11/16/21   [provider]  dutasteride (AVODART) 0.5 MG capsule Take 0.5 mg by mouth daily. 02/17/22   [provider]  folic acid (FOLVITE) 1 MG tablet Take 1 mg by mouth daily. 07/13/22   [provider]  gabapentin (NEURONTIN) 100 MG capsule Take 100 mg by mouth at bedtime. Patient not taking: Reported on 02/11/2023 04/23/22   [provider]  hydroxychloroquine (PLAQUENIL) 200 MG tablet Take 200 mg by mouth 2 (two) times daily. 01/11/22   [provider]  lisinopril (ZESTRIL) 20 MG tablet Take 1 tablet by mouth daily. Patient not taking: Reported on 02/11/2023 02/15/17   [provider]  methotrexate (RHEUMATREX) 2.5 MG tablet Take 5 tablets by mouth every 7 (seven) days. 08/08/22   [provider]  Multiple Vitamin (MULTIVITAMIN) capsule Take 1 capsule by mouth daily.    [provider]  oxyCODONE-acetaminophen (PERCOCET/ROXICET) 5-325 MG tablet Take 1 tablet by mouth every 6 (six) hours as needed for severe pain. 12/04/22   Delman Kitten, MD  potassium chloride SA (KLOR-CON M) 20 MEQ tablet Take 1 tablet (20 mEq total) by mouth 2 (two) times daily for 3 days. 12/04/22 12/07/22  Nathaniel Man, MD  prochlorperazine (COMPAZINE) 5 MG tablet Take 1 tablet (5 mg total) by mouth every 6 (six) hours as needed for nausea or vomiting. 05/27/22   Tylene Fantasia, PA-C  promethazine (PHENERGAN) 12.5 MG tablet Take 1 tablet (12.5 mg total) by mouth every 6 (six) hours as needed for nausea or vomiting. 08/26/22   Tylene Fantasia, PA-C     ALLERGIES:  Allergies  Allergen Reactions   Carboplatin Anaphylaxis    ANAPHYLACTIC REACTION ANAPHYLACTIC REACTION    Latex Hives, Itching and Rash   Nitrofurantoin Nausea Only and Nausea And Vomiting   Silicone Itching and Rash   Wheat Other (See Comments)    Other reaction(s): Other (See Comments) Body aches and swelling Body aches and swelling    Fentanyl Hives, Itching and Rash     SOCIAL HISTORY:  Social History   Socioeconomic History   Marital status: Married    Spouse name: Not on file   Number of children: Not on file   Years of education: Not on file   Highest  education level: Not on file  Occupational History   Not on file  Tobacco Use   Smoking status: Never   Smokeless tobacco: Never  Vaping Use   Vaping Use: Never used  Substance and Sexual Activity   Alcohol use: Yes    Comment: occ.   Drug use: Never   Sexual activity: Not Currently  Other Topics Concern   Not on file  Social History Narrative   ** Merged History Encounter **       Social Determinants of Health   Financial Resource Strain: Not on file  Food Insecurity: Not on file  Transportation Needs: Not on file  Physical Activity: Not on file  Stress: Not on file  Social Connections: Not on file  Intimate Partner Violence: Not on file     FAMILY HISTORY:  History reviewed. No pertinent family history.    REVIEW OF SYSTEMS:  Review of Systems  Constitutional:  Negative for chills and fever.  HENT:  Negative for congestion and sore throat.   Respiratory:  Negative for cough and shortness of breath.   Cardiovascular:  Negative for chest  pain and palpitations.  Gastrointestinal:  Positive for abdominal pain, nausea and vomiting. Negative for constipation and diarrhea.  Genitourinary:  Negative for dysuria and urgency.  All other systems reviewed and are negative.   VITAL SIGNS:  Temp:  [98.5 F (36.9 C)] 98.5 F (36.9 C) (03/08 1055) Pulse Rate:  [87] 87 (03/08 1055) Resp:  [16] 16 (03/08 1055) BP: (144)/(72) 144/72 (03/08 1055) SpO2:  [92 %] 92 % (03/08 1055) Weight:  [72.6 kg] 72.6 kg (03/08 1055)     Height: '5\' 7"'$  (170.2 cm) Weight: 72.6 kg BMI (Calculated): 25.05   INTAKE/OUTPUT:  No intake/output data recorded.  PHYSICAL EXAM:  Physical Exam Vitals and nursing note reviewed. Exam conducted with a chaperone present.  Constitutional:      General: She is not in acute distress.    Appearance: She is well-developed. She is not ill-appearing.     Comments: Patient resting in bed; NAD, husband at bedside   HENT:     Head: Normocephalic and atraumatic.     Mouth/Throat:     Pharynx: Oropharynx is clear.     Comments: NGT in place  Eyes:     General: No scleral icterus.    Extraocular Movements: Extraocular movements intact.     Pupils: Pupils are equal, round, and reactive to light.  Cardiovascular:     Rate and Rhythm: Normal rate.     Heart sounds: Normal heart sounds. No murmur heard. Pulmonary:     Effort: Pulmonary effort is normal. No respiratory distress.     Breath sounds: Normal breath sounds.  Abdominal:     General: A surgical scar is present. There is distension.     Tenderness: There is abdominal tenderness in the periumbilical area. There is no guarding or rebound.     Comments: Abdomen is tight, markedly distended, tenderness periumbilically and just to the right, no rebound/guarding. She is without overt peritonitis. Previous laparoscopic and ileostomy sites are well healed    Genitourinary:    Comments: Deferred Skin:    General: Skin is warm and dry.     Coloration: Skin is not  jaundiced or pale.  Neurological:     General: No focal deficit present.     Mental Status: She is alert and oriented to person, place, and time.  Psychiatric:        Mood and  Affect: Mood normal.        Behavior: Behavior normal.      Labs:     Latest Ref Rng & Units 02/11/2023   11:01 AM 12/04/2022    7:38 AM 11/05/2022   11:28 AM  CBC  WBC 4.0 - 10.5 K/uL 11.5  8.8  7.1   Hemoglobin 12.0 - 15.0 g/dL 13.6  14.9  12.7   Hematocrit 36.0 - 46.0 % 42.2  42.0  37.7   Platelets 150 - 400 K/uL 342  341  294       Latest Ref Rng & Units 02/11/2023   11:01 AM 12/04/2022    7:38 AM 11/05/2022   11:28 AM  CMP  Glucose 70 - 99 mg/dL 114  177  119   BUN 8 - 23 mg/dL '13  11  20   '$ Creatinine 0.44 - 1.00 mg/dL 0.56  0.65  1.22   Sodium 135 - 145 mmol/L 136  137  135   Potassium 3.5 - 5.1 mmol/L 4.0  2.7  3.5   Chloride 98 - 111 mmol/L 103  100  95   CO2 22 - 32 mmol/L '25  23  28   '$ Calcium 8.9 - 10.3 mg/dL 9.3  10.2  9.8   Total Protein 6.5 - 8.1 g/dL 7.3  7.6  7.0   Total Bilirubin 0.3 - 1.2 mg/dL 0.6  0.8  0.9   Alkaline Phos 38 - 126 U/L 74  46  49   AST 15 - 41 U/L 22  42  21   ALT 0 - 44 U/L 17  42  18      Imaging studies:   CT Abdomen/Pelvis (02/11/2023) personally reviewed which does show dilated loops of small bowel distally with some degree of fecalization, stomach is decompressed, small bowel anastomosis appears patent and distended with small bowel, there may be an adhesive band in this area but terminal ileum appears normal caliber, there is marked colonic stool burden through, distal colonic anastomosis appears intact and patent, no free air, and radiologist report reviewed below:  IMPRESSION: 1. Diffuse distention of the small bowel, moderately dilated at the anastomotic site in the distal ileum measuring up to 4.9 cm, where there is a tapered transition zone. Extensive fecalization of bowel upstream from this area of transition with multiple air-fluid levels. Findings  are most compatible with a small bowel obstruction, which may be partial. 2. Very large volume of stool throughout the colon. 3. Small volume ascites in the abdomen and pelvis, likely reactive. 4. Prior cholecystectomy with mild to moderate intra and extrahepatic biliary dilatation. Correlate with LFTs. 5. Aortic atherosclerosis (ICD10-I70.0).   Assessment/Plan: (ICD-10's: K59.609) 62 y.o. female with abdominal pain and distension concerning for possible partial SBO as well as significant constipation.   - Appreciate medicine admission  - No indication for emergent surgical intervention at this time; She and her husband understands that we will attempt conservative measures with the goal of avoiding surgical intervention.   - Reasonable for NGT decompression; LIS; monitor and record output. Stomach is decompressed on imaging and there may not be significant output  - She will need aggressive bowel regimen; weill start with enemas - suspect significant constipation is major contributing factor in her presentation   - Will get morning KUB  - NPO; okay for a few ice chips for comfort  - Continue IVF Support  - Monitor abdominal examination; on-going bowel function  - Pain control prn; antiemetics prn  -  Okay to mobilize   - Further management per primary service; we will follow   All of the above findings and recommendations were discussed with the patient and her family (husband at bedside), and all of their questions were answered to their expressed satisfaction.  Thank you for the opportunity to participate in this patient's care.   -- Edison Simon, PA-C [facility] 02/11/2023, 3:11 PM M-F: 7am - 4pm

## 2023-02-11 NOTE — Assessment & Plan Note (Signed)
Continue routine outpatient rheumatology follow-up [name] 200 mg p.o. twice daily methotrexate 12.5 mg weekly and folic acid 1 mg daily have been held on admission

## 2023-02-11 NOTE — ED Triage Notes (Signed)
Patient presents with abdominal pain with N/V. History of bowel obstruction. Reports symptoms X2-3 days

## 2023-02-11 NOTE — ED Notes (Signed)
Pt does not want enema until she is in her room. Pt knows she has an assigned room. RN made aware.

## 2023-02-11 NOTE — Assessment & Plan Note (Signed)
Secondary to small bowel obstruction, treat per above

## 2023-02-11 NOTE — Assessment & Plan Note (Signed)
Home lisinopril 20 mg daily, chlorthalidone 50 mg every morning were not resumed on admission Hydralazine 5 mg IV to 6 hours as needed for SBP greater 175, 5 days ordered

## 2023-02-11 NOTE — ED Provider Notes (Signed)
[facility] Provider Note    Event Date/Time   First MD Initiated Contact with Patient 02/11/23 1116     (approximate)   History   Abdominal Pain   HPI  Elizabeth Martin is a 62 y.o. female with history of small bowel obstruction as well as status post ileostomy and 2023 presents to the ER for evaluation of acute severe urinary onset abdominal pain nausea vomiting.  Not passing gas or moving her bowels for the past 24 hours.  Was otherwise feeling well.  No significant medication changes.  Symptoms started while at home.  [place] any measured fevers does have chills.     Physical Exam   Triage Vital Signs: ED Triage Vitals  Enc Vitals Group     BP 02/11/23 1055 (!) 144/72     Pulse Rate 02/11/23 1055 87     Resp 02/11/23 1055 16     Temp 02/11/23 1055 98.5 F (36.9 C)     Temp Source 02/11/23 1055 Oral     SpO2 02/11/23 1055 92 %     Weight 02/11/23 1055 160 lb (72.6 kg)     Height 02/11/23 1055 '5\' 7"'$  (1.702 m)     Head Circumference --      Peak Flow --      Pain Score 02/11/23 1057 8     Pain Loc --      Pain Edu? --      Excl. in [place]? --     Most recent vital signs: Vitals:   02/11/23 1055  BP: (!) 144/72  Pulse: 87  Resp: 16  Temp: 98.5 F (36.9 C)  SpO2: 92%     Constitutional: Alert  Eyes: Conjunctivae are normal.  Head: Atraumatic. Nose: No congestion/rhinnorhea. Mouth/Throat: Mucous membranes are moist.   Neck: Painless ROM.  Cardiovascular:   Good peripheral circulation. Respiratory: Normal respiratory effort.  No retractions.  Gastrointestinal: Soft mild diffuse tenderness palpation no guarding or rebound.  .  Musculoskeletal:  no deformity Neurologic:  MAE spontaneously. No gross focal neurologic deficits are appreciated.  Skin:  Skin is warm, dry and intact. No rash noted. Psychiatric: Mood and affect are normal. Speech and behavior are normal.    ED Results / Procedures / Treatments   Labs (all labs ordered are  listed, but only abnormal results are displayed) Labs Reviewed  LIPASE, BLOOD - Abnormal; Notable for the following components:      Result Value   Lipase 83 (*)    All other components within normal limits  COMPREHENSIVE METABOLIC PANEL - Abnormal; Notable for the following components:   Glucose, Bld 114 (*)    All other components within normal limits  CBC - Abnormal; Notable for the following components:   WBC 11.5 (*)    All other components within normal limits  URINALYSIS, ROUTINE W REFLEX MICROSCOPIC - Abnormal; Notable for the following components:   Color, Urine YELLOW (*)    APPearance CLEAR (*)    All other components within normal limits  RESP PANEL BY RT-PCR (RSV, FLU A&B, COVID)  [id number]     EKG  ED ECG REPORT I, Merlyn Lot, the attending physician, personally viewed and interpreted this ECG.   Date: 02/11/2023  EKG Time: 11:55  Rate: 80  Rhythm: sinus  Axis: normal  Intervals: normal  ST&T Change: no stemi, no depressions    RADIOLOGY Please see ED Course for my review and interpretation.  I personally reviewed all radiographic images ordered  to evaluate for the above acute complaints and reviewed radiology reports and findings.  These findings were personally discussed with the patient.  Please see medical record for radiology report.    PROCEDURES:  Critical Care performed: No  Procedures   MEDICATIONS ORDERED IN ED: Medications  morphine (PF) 4 MG/ML injection 4 mg (4 mg Intravenous Given 02/11/23 1433)  ondansetron (ZOFRAN) tablet 4 mg (has no administration in time range)    Or  ondansetron (ZOFRAN) injection 4 mg (has no administration in time range)  heparin injection 5,000 Units (has no administration in time range)  promethazine (PHENERGAN) 12.5 mg in sodium chloride 0.9 % 50 mL IVPB (has no administration in time range)  morphine (PF) 2 MG/ML injection 2 mg (has no administration in time range)  HYDROmorphone (DILAUDID) injection  0.5 mg (has no administration in time range)  acetaminophen (OFIRMEV) IV 1,000 mg (has no administration in time range)  sodium chloride 0.9 % bolus 1,000 mL (1,000 mLs Intravenous New Bag/Given 02/11/23 1248)  metoCLOPramide (REGLAN) injection 10 mg (10 mg Intravenous Given 02/11/23 1249)  iohexol (OMNIPAQUE) 300 MG/ML solution 100 mL (100 mLs Intravenous Contrast Given 02/11/23 1338)     IMPRESSION / MDM / ASSESSMENT AND PLAN / ED COURSE  I reviewed the triage vital signs and the nursing notes.                              Differential diagnosis includes, but is not limited to, SBO, colitis, enteritis, gastritis, pancreatitis, diverticulitis, dehydration, viral illness  Patient presenting to the ER for evaluation of symptoms as described above.  Based on symptoms, risk factors and considered above differential, this presenting complaint could reflect a potentially life-threatening illness therefore the patient will be placed on continuous pulse oximetry and telemetry for monitoring.  Laboratory evaluation will be sent to evaluate for the above complaints.      Clinical Course as of 02/11/23 1445  Fri Feb 11, 2023  1133 XR Kub on my review and Interpretation without evidence of clear obstruction.  Patient leukocytosis mild elevation of lipase and presenting symptoms and history we will order CT imaging to further evaluate.  I have ordered IV morphine IV Reglan and IV fluids. [PR]  [id number] CT on my review interpretation is concerning for small bowel obstruction will await formal radiology report. [PR]  [id number] CT imaging with evidence of small bowel obstruction.  Will place NG tube.  Will consult surgery. [PR]  1445 Discussed in consultation with hospitalist for admission. [PR]    Clinical Course User Index [PR] Merlyn Lot, MD      FINAL CLINICAL IMPRESSION(S) / ED DIAGNOSES   Final diagnoses:  Small bowel obstruction ([place])     Rx / DC Orders   ED Discharge Orders     None         Note:  This document was prepared using Dragon voice recognition software and may include unintentional dictation errors.    Merlyn Lot, MD 02/11/23 [phone]

## 2023-02-11 NOTE — Plan of Care (Signed)
Initiated

## 2023-02-11 NOTE — Assessment & Plan Note (Addendum)
Conservative medical management at this time Symptomatic support: Morphine 2 mg IV every 4 hours as needed for moderate pain, 4 doses ordered; Dilaudid 0.5 mg IV every 4 hours as needed for severe pain, 4 doses ordered Ondansetron 4 mg IV every 6 hours.  For nausea and vomiting 5 days ordered; Phenergan 12.5 mg IV every 6 hours as needed for refractory nausea and vomiting, 3 doses ordered Status post sodium chloride 1 L bolus and NG tube placement per EDP LR 125 mL/h, 1 day ordered N.p.o. EDP consulted general surgery, Dr. Hampton Abbot, awaiting call back; I have placed an Epic routine order Admit to telemetry surgical, inpatient

## 2023-02-11 NOTE — H&P (Addendum)
History and Physical   Elizabeth Martin [id number] DOB: [birth year] DOA: 02/11/2023  PCP: Dianne Dun, MD  Outpatient Specialists: Dr. Ephriam Jenkins, [facility] clinic rheumatology Patient coming from: Abdominal pain, nausea, vomiting  I have personally briefly reviewed patient's old medical records in [place].  Chief Concern: Abdominal pain  HPI: Ms. Elizabeth Martin is a 62 year old female with history of small bowel obstruction, ANA positive polyarthralgia, Raynaud's disease without gangrene, long-term use of immunosuppressant medication, hypertension, history of cystectomy, depression, anxiety, who presents emergency department for chief concerns of abdominal pain, nausea, vomiting.  Vitals in the ED showed temperature of 98.5, respiration rate of 16, heart rate 87, blood pressure 144/72, SpO2 of 92% on room air.  Serum sodium of 136, potassium 4.0, chloride 103, bicarb 25, BUN of 13, serum creatinine of 0.56, EGFR greater than 60, nonfasting blood glucose 114, WBC 11.5, hemoglobin 13.6, platelets 342.  COVID/influenza A/influenza B/RSV PCR were negative.  UA was negative for leukocytes and nitrates. Lipase was 83.  ED treatment: Sodium chloride 1 L bolus.  Morphine 4 mg IV one-time dose, Reglan 10 mg IV. ------------------------ At bedside, patient was able to tell me her name, age, location, current calendar year.  Patient reports she last had a good bowel movement 2 days ago.  She reports last night she developed cramping spasms of her belly that would come and go.  She reports the pain is similar to her last episode of small bowel obstruction in August/July 2023.  She started vomiting today, too many times to count.  She denies blood or coffee-ground emesis.  She denies chest pain, shortness of breath, dysuria, hematuria, blood in her stool.  Social history: She lives at home with her husband.  She denies tobacco, EtOH, recreational drug use.  She is retired and formerly  worked as a fourth, fifth Psychiatric nurse.  ROS: Constitutional: no weight change, no fever ENT/Mouth: no sore throat, no rhinorrhea Eyes: no eye pain, no vision changes Cardiovascular: no chest pain, no dyspnea,  no edema, no palpitations Respiratory: no cough, no sputum, no wheezing Gastrointestinal: + nausea, + vomiting, no diarrhea, no constipation, + abdominal pain Genitourinary: no urinary incontinence, no dysuria, no hematuria Musculoskeletal: no arthralgias, no myalgias Skin: no skin lesions, no pruritus, Neuro: + weakness, no loss of consciousness, no syncope Psych: no anxiety, no depression, + decrease appetite Heme/Lymph: no bruising, no bleeding  ED Course: Discussed with emergency medicine physician for concerns of bowel obstruction.  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  For nausea and vomiting 5 days ordered; Phenergan 12.5 mg IV every 6 hours as needed for refractory nausea and vomiting, 3 doses ordered Status post sodium chloride 1 L bolus and NG tube placement per EDP LR 125 mL/h, 1 day ordered N.p.o. EDP consulted general surgery, Dr. Hampton Abbot, awaiting call back; I have placed an Epic routine order Admit to telemetry surgical, inpatient  Abdominal pain Secondary  to small bowel obstruction, treat per above  Essential hypertension Home lisinopril 20 mg daily, chlorthalidone 50 mg every morning were not resumed on  admission Hydralazine 5 mg IV to 6 hours as needed for SBP greater 175, 5 days ordered  Gastroesophageal reflux disease Protonix 40 mg IV twice daily, 2 days ordered  Polyarthralgia Continue routine outpatient rheumatology follow-up [name] 200 mg p.o. twice daily methotrexate 12.5 mg weekly and folic acid 1 mg daily have been held on admission  Chart reviewed.   DVT prophylaxis: Heparin 5000 units starting on 02/11/2023 at 2200 Code Status: Full code Diet: N.p.o. Family Communication: Updated spouse at bedside with patient's permission Disposition Plan: Pending clinical course Consults called: General surgery Admission status: Telemetry surgical, inpatient  Past Medical History:  Diagnosis Date   Asthma    Autoimmune disease ([place])    Cancer ([place])    Endometrial lymph node removal (30)   Corneal abrasion, right 05/24/2022   GERD (gastroesophageal reflux disease)    H/O blood clots    upper rt groin and behind both knees   History of hiatal hernia    Hypertension    Lymphedema    right leg   Morphea scleroderma    PONV (postoperative nausea and vomiting)    states gets violently ill   Past Surgical History:  Procedure Laterality Date   BASAL CELL CARCINOMA EXCISION Left    CHOLECYSTECTOMY  2008   FLEXIBLE SIGMOIDOSCOPY N/A 05/19/2022   Procedure: FLEXIBLE SIGMOIDOSCOPY;  Surgeon: Toledo, Benay Pike, MD;  Location: [facility] END[facility];  Service: Gastroenterology;  Laterality: N/A;   HERNIA REPAIR  2004   ILEOSTOMY CLOSURE N/A 08/24/2022   Procedure: ILEOSTOMY TAKEDOWN, open loop with Edison Simon, PA-C to assist;  Surgeon: Olean Ree, MD;  Location: [facility] ORS;  Service: General;  Laterality: N/A;   PLANTAR FASCIECTOMY     ROBOTIC ASSISTED TOTAL HYSTERECTOMY WITH BILATERAL SALPINGO OOPHERECTOMY  02/14/2012   ROTATOR CUFF REPAIR Left 03/24/2022   TONSILLECTOMY     XI ROBOTIC ASSISTED LOWER ANTERIOR RESECTION N/A 05/24/2022   Procedure: XI ROBOTIC ASSISTED LOWER ANTERIOR  RESECTION;  Surgeon: Olean Ree, MD;  Location: [facility] ORS;  Service: General;  Laterality: N/A;   Social History:  reports that she has never smoked. She has never used smokeless tobacco. She reports current alcohol use. She reports that she does not use drugs.  Allergies  Allergen Reactions   Carboplatin Anaphylaxis    ANAPHYLACTIC REACTION ANAPHYLACTIC REACTION    Latex Hives, Itching and Rash   Nitrofurantoin Nausea Only and Nausea And Vomiting   Silicone Itching and Rash   Wheat Other (See Comments)    Other reaction(s): Other (See Comments) Body aches and swelling Body aches and swelling    Fentanyl Hives, Itching and Rash   History reviewed. No pertinent family history. Family history: Family history reviewed and not pertinent  Prior to Admission medications   Medication Sig Start Date End Date Taking? Authorizing Provider  desonide (DESOWEN) 0.05 % cream Apply 1 Application topically 2 (two) times daily. 01/31/23  Yes [provider]  gabapentin (NEURONTIN) 300 MG capsule Take 600 mg by mouth 4 (four) times daily. 02/08/23  Yes [provider]  hydrOXYzine (ATARAX) 25 MG tablet Take 25 mg by mouth at bedtime as needed for itching. 01/31/23  Yes [provider]  KLOR-CON M10 10 MEQ tablet Take 10 mEq by mouth daily. 12/07/22  Yes [provider]  lisinopril (ZESTRIL) 40 MG tablet Take 40 mg by mouth  daily. 12/29/22  Yes [provider]  omeprazole (PRILOSEC) 40 MG capsule Take 40 mg by mouth daily. 12/29/22  Yes [provider]  ondansetron (ZOFRAN-ODT) 8 MG disintegrating tablet Take 8 mg by mouth every 8 (eight) hours as needed. 12/29/22  Yes [provider]  pregabalin (LYRICA) 100 MG capsule Take 100 mg by mouth 3 (three) times daily. 02/10/23  Yes [provider]  triamcinolone (KENALOG) 0.025 % cream Apply 1 Application topically 2 (two) times daily. 12/15/22  Yes [provider]  albuterol (VENTOLIN  HFA) 108 (90 Base) MCG/ACT inhaler Inhale 2 puffs into the lungs every 6 (six) hours as needed. 01/11/22   [provider]  celecoxib (CELEBREX) 100 MG capsule Take 100 mg by mouth 2 (two) times daily as needed for moderate pain. 04/28/22   [provider]  cetirizine (ZYRTEC) 10 MG tablet Take 10 mg by mouth daily as needed. 08/10/21   [provider]  chlorthalidone (HYGROTON) 50 MG tablet Take 50 mg by mouth every morning. 11/08/21   [provider]  cyclobenzaprine (FLEXERIL) 5 MG tablet Take 5 mg by mouth at bedtime as needed.    [provider]  DULoxetine (CYMBALTA) 30 MG capsule Take 90 mg by mouth daily. 11/16/21   [provider]  dutasteride (AVODART) 0.5 MG capsule Take 0.5 mg by mouth daily. 02/17/22   [provider]  folic acid (FOLVITE) 1 MG tablet Take 1 mg by mouth daily. 07/13/22   [provider]  gabapentin (NEURONTIN) 100 MG capsule Take 100 mg by mouth at bedtime. Patient not taking: Reported on 02/11/2023 04/23/22   [provider]  hydroxychloroquine (PLAQUENIL) 200 MG tablet Take 200 mg by mouth 2 (two) times daily. 01/11/22   [provider]  lisinopril (ZESTRIL) 20 MG tablet Take 1 tablet by mouth daily. Patient not taking: Reported on 02/11/2023 02/15/17   [provider]  methotrexate (RHEUMATREX) 2.5 MG tablet Take 5 tablets by mouth every 7 (seven) days. 08/08/22   [provider]  Multiple Vitamin (MULTIVITAMIN) capsule Take 1 capsule by mouth daily.    [provider]  oxyCODONE-acetaminophen (PERCOCET/ROXICET) 5-325 MG tablet Take 1 tablet by mouth every 6 (six) hours as needed for severe pain. 12/04/22   Delman Kitten, MD  potassium chloride SA (KLOR-CON M) 20 MEQ tablet Take 1 tablet (20 mEq total) by mouth 2 (two) times daily for 3 days. 12/04/22 12/07/22  Nathaniel Man, MD  prochlorperazine (COMPAZINE) 5 MG tablet Take 1 tablet (5 mg total) by mouth every 6 (six)  hours as needed for nausea or vomiting. 05/27/22   Tylene Fantasia, PA-C  promethazine (PHENERGAN) 12.5 MG tablet Take 1 tablet (12.5 mg total) by mouth every 6 (six) hours as needed for nausea or vomiting. 08/26/22   Tylene Fantasia, PA-C   Physical Exam: Vitals:   02/11/23 1055  BP: (!) 144/72  Pulse: 87  Resp: 16  Temp: 98.5 F (36.9 C)  TempSrc: Oral  SpO2: 92%  Weight: 72.6 kg  Height: '5\' 7"'$  (1.702 m)   Constitutional: appears age-appropriate, NAD, calm, comfortable Eyes: PERRL, lids and conjunctivae normal ENMT: Mucous membranes are moist. Posterior pharynx clear of any exudate or lesions. Age-appropriate dentition. Hearing appropriate Neck: normal, supple, no masses, no thyromegaly Respiratory: clear to auscultation bilaterally, no wheezing, no crackles. Normal respiratory effort. No accessory muscle use.  Cardiovascular: Regular rate and rhythm, no murmurs / rubs / gallops. No extremity edema. 2+ pedal pulses. No  carotid bruits.  Abdomen: + Bilateral lower abdominal tenderness, no masses palpated, no hepatosplenomegaly.  Decreased bowel sounds. Musculoskeletal: no clubbing / cyanosis. No joint deformity upper and lower extremities. Good ROM, no contractures, no atrophy. Normal muscle tone.  Skin: no rashes, lesions, ulcers. No induration Neurologic: Sensation intact. Strength 5/5 in all 4.  Psychiatric: Normal judgment and insight. Alert and oriented x 3. Normal mood.   EKG: independently reviewed, showing sinus rhythm with rate of 81, QTc 448 x-ray on Admission: I personally reviewed and I agree with radiologist reading as below.  DG Abd Portable 1 View  Result Date: 02/11/2023 CLINICAL DATA:  NG tube placement. EXAM: PORTABLE ABDOMEN - 1 VIEW COMPARISON:  02/11/2023. FINDINGS: Enteric tube tip and side port project over the stomach. Mild dilation of small bowel loops in the left upper quadrant. Large burden of stool in the colon. Excreted contrast in the renal collecting  systems. IMPRESSION: Enteric tube tip and side port project over the stomach. Electronically Signed   By: Emmit Alexanders M.D.   On: 02/11/2023 15:07   CT ABDOMEN PELVIS W CONTRAST  Result Date: 02/11/2023 CLINICAL DATA:  Nausea, vomiting, constipation EXAM: CT ABDOMEN AND PELVIS WITH CONTRAST TECHNIQUE: Multidetector CT imaging of the abdomen and pelvis was performed using the standard protocol following bolus administration of intravenous contrast. RADIATION DOSE REDUCTION: This exam was performed according to the departmental dose-optimization program which includes automated exposure control, adjustment of the mA and/or kV according to patient size and/or use of iterative reconstruction technique. CONTRAST:  139m OMNIPAQUE IOHEXOL 300 MG/ML  SOLN COMPARISON:  CT 12/04/2022 FINDINGS: Lower chest: No acute abnormality. Hepatobiliary: No focal liver abnormality is identified. Prior cholecystectomy with mild to moderate intra and extrahepatic biliary dilatation. Pancreas: Unremarkable. No pancreatic ductal dilatation or surrounding inflammatory changes. Spleen: Normal in size without focal abnormality. Adrenals/Urinary Tract: Unremarkable adrenal glands. Kidneys enhance symmetrically without focal lesion, stone, or hydronephrosis. Ureters are nondilated. Urinary bladder appears unremarkable for the degree of distention. Stomach/Bowel: Small hiatal hernia. Stomach is otherwise unremarkable. Diffuse distention of the small bowel, the majority of which measures up to approximately 3.1 cm in diameter. Distal small bowel at the anastomotic site is focally dilated up to 4.9 cm immediately upstream from a area of somewhat tapered transition to decreased caliber terminal ileum (series 2, image 68). Extensive fecalization of bowel upstream from this area of transition. Multiple air-fluid levels. Very large volume of stool throughout the colon. Prior rectosigmoid anastomosis. A normal appendix is seen in the right lower  quadrant. No focal bowel wall thickening or inflammatory changes. Vascular/Lymphatic: Scattered aortoiliac atherosclerotic calcifications without aneurysm. No abdominopelvic lymphadenopathy. Reproductive: Status post hysterectomy. No adnexal masses. Other: Small volume ascites in the abdomen and pelvis, likely reactive. No organized abdominopelvic fluid collection. No pneumoperitoneum. Musculoskeletal: No acute or significant osseous findings. IMPRESSION: 1. Diffuse distention of the small bowel, moderately dilated at the anastomotic site in the distal ileum measuring up to 4.9 cm, where there is a tapered transition zone. Extensive fecalization of bowel upstream from this area of transition with multiple air-fluid levels. Findings are most compatible with a small bowel obstruction, which may be partial. 2. Very large volume of stool throughout the colon. 3. Small volume ascites in the abdomen and pelvis, likely reactive. 4. Prior cholecystectomy with mild to moderate intra and extrahepatic biliary dilatation. Correlate with LFTs. 5. Aortic atherosclerosis (ICD10-I70.0). Electronically Signed   By: NDavina PokeD.O.   On: 02/11/2023 13:59   DG Abdomen 1  View  Result Date: 02/11/2023 CLINICAL DATA:  Nausea, vomiting, constipation. EXAM: ABDOMEN - 1 VIEW COMPARISON:  May 20, 2022. FINDINGS: No abnormal bowel dilatation. Large amount of stool seen throughout the colon. Status post cholecystectomy. No radio-opaque calculi or other significant radiographic abnormality are seen. IMPRESSION: Large stool burden.  No abnormal bowel dilatation. Electronically Signed   By: Marijo Conception M.D.   On: 02/11/2023 11:22    Labs on Admission: I have personally reviewed following labs  CBC: Recent Labs  Lab 02/11/23 1101  WBC 11.5*  HGB 13.6  HCT 42.2  MCV 91.3  PLT [id number]   Basic Metabolic Panel: Recent Labs  Lab 02/11/23 1101  NA 136  K 4.0  CL 103  CO2 25  GLUCOSE 114*  BUN 13  CREATININE 0.56   CALCIUM 9.3   GFR: Estimated Creatinine Clearance: 71.8 mL/min (by C-G formula based on SCr of 0.56 mg/dL).  Liver Function Tests: Recent Labs  Lab 02/11/23 1101  AST 22  ALT 17  ALKPHOS 74  BILITOT 0.6  PROT 7.3  ALBUMIN 4.1   Recent Labs  Lab 02/11/23 1101  LIPASE 83*   Urine analysis:    Component Value Date/Time   COLORURINE YELLOW (A) 02/11/2023 1059   APPEARANCEUR CLEAR (A) 02/11/2023 1059   APPEARANCEUR Clear 02/12/2022 1137   LABSPEC 1.017 02/11/2023 1059   PHURINE 5.0 02/11/2023 1059   GLUCOSEU NEGATIVE 02/11/2023 1059   HGBUR NEGATIVE 02/11/2023 1059   BILIRUBINUR NEGATIVE 02/11/2023 1059   BILIRUBINUR Negative 02/12/2022 1137   [place] NEGATIVE 02/11/2023 1059   PROTEINUR NEGATIVE 02/11/2023 1059   NITRITE NEGATIVE 02/11/2023 1059   LEUKOCYTESUR NEGATIVE 02/11/2023 1059   This document was prepared using Dragon Voice Recognition software and may include unintentional dictation errors.  Dr. Tobie Poet [facility]  If 7PM-7AM, please contact overnight-coverage provider If 7AM-7PM, please contact day coverage provider [email or web address]  02/11/2023, 3:15 PM

## 2023-02-11 NOTE — Hospital Course (Addendum)
Ms. Elizabeth Martin is a 62 year old female with history of small bowel obstruction, ANA positive polyarthralgia, Raynaud's disease without gangrene, long-term use of immunosuppressant medication, hypertension, history of cystectomy, depression, anxiety, who presents emergency department for chief concerns of abdominal pain, nausea, vomiting.  Vitals in the ED showed temperature of 98.5, respiration rate of 16, heart rate 87, blood pressure 144/72, SpO2 of 92% on room air.  Serum sodium of 136, potassium 4.0, chloride 103, bicarb 25, BUN of 13, serum creatinine of 0.56, EGFR greater than 60, nonfasting blood glucose 114, WBC 11.5, hemoglobin 13.6, platelets 342.  COVID/influenza A/influenza B/RSV PCR were negative.  UA was negative for leukocytes and nitrates. Lipase was 83.  ED treatment: Sodium chloride 1 L bolus.  Morphine 4 mg IV one-time dose, Reglan 10 mg IV.

## 2023-02-12 ENCOUNTER — Inpatient Hospital Stay: Payer: BC Managed Care – PPO

## 2023-02-12 DIAGNOSIS — I10 Essential (primary) hypertension: Secondary | ICD-10-CM

## 2023-02-12 DIAGNOSIS — K56609 Unspecified intestinal obstruction, unspecified as to partial versus complete obstruction: Secondary | ICD-10-CM

## 2023-02-12 DIAGNOSIS — M2550 Pain in unspecified joint: Secondary | ICD-10-CM

## 2023-02-12 DIAGNOSIS — R1030 Lower abdominal pain, unspecified: Secondary | ICD-10-CM

## 2023-02-12 DIAGNOSIS — K219 Gastro-esophageal reflux disease without esophagitis: Secondary | ICD-10-CM

## 2023-02-12 LAB — CBC
HCT: 36.3 % (ref 36.0–46.0)
Hemoglobin: 11.9 g/dL — ABNORMAL LOW (ref 12.0–15.0)
MCH: 30.1 pg (ref 26.0–34.0)
MCHC: 32.8 g/dL (ref 30.0–36.0)
MCV: 91.7 fL (ref 80.0–100.0)
Platelets: 279 10*3/uL (ref 150–400)
RBC: 3.96 MIL/uL (ref 3.87–5.11)
RDW: 14.5 % (ref 11.5–15.5)
WBC: 8.9 10*3/uL (ref 4.0–10.5)
nRBC: 0 % (ref 0.0–0.2)

## 2023-02-12 LAB — COMPREHENSIVE METABOLIC PANEL
ALT: 63 U/L — ABNORMAL HIGH (ref 0–44)
AST: 60 U/L — ABNORMAL HIGH (ref 15–41)
Albumin: 3.4 g/dL — ABNORMAL LOW (ref 3.5–5.0)
Alkaline Phosphatase: 88 U/L (ref 38–126)
Anion gap: 8 (ref 5–15)
BUN: 9 mg/dL (ref 8–23)
CO2: 26 mmol/L (ref 22–32)
Calcium: 8.7 mg/dL — ABNORMAL LOW (ref 8.9–10.3)
Chloride: 106 mmol/L (ref 98–111)
Creatinine, Ser: 0.51 mg/dL (ref 0.44–1.00)
GFR, Estimated: >60 mL/min (ref >60)
Glucose, Bld: 97 mg/dL (ref 70–99)
Potassium: 3.7 mmol/L (ref 3.5–5.1)
Sodium: 140 mmol/L (ref 135–145)
Total Bilirubin: 1 mg/dL (ref 0.3–1.2)
Total Protein: 5.9 g/dL — ABNORMAL LOW (ref 6.5–8.1)

## 2023-02-12 LAB — MAGNESIUM: Magnesium: 2 mg/dL (ref 1.7–2.4)

## 2023-02-12 LAB — PHOSPHORUS: Phosphorus: 3.6 mg/dL (ref 2.5–4.6)

## 2023-02-12 MED ORDER — LACTATED RINGERS IV SOLN: INTRAVENOUS | Status: DC

## 2023-02-12 MED ORDER — DIATRIZOATE MEGLUMINE & SODIUM 66-10 % PO SOLN
90.0000 mL | Freq: Once | ORAL | Status: AC
  Administered 2023-02-12: 90 mL via NASOGASTRIC

## 2023-02-12 MED ORDER — PHENOL 1.4 % MT LIQD
1.0000 | OROMUCOSAL | Status: DC | PRN
  Filled 2023-02-12: qty 177

## 2023-02-12 MED ORDER — ORAL CARE MOUTH RINSE: 15.0000 mL | OROMUCOSAL | Status: DC | PRN

## 2023-02-12 MED ORDER — PREGABALIN 50 MG PO CAPS
100.0000 mg | ORAL_CAPSULE | Freq: Three times a day (TID) | ORAL | Status: DC
  Administered 2023-02-12 – 2023-02-13 (×2): 100 mg via ORAL
  Filled 2023-02-12 (×2): qty 2

## 2023-02-12 NOTE — Progress Notes (Signed)
Progress Note   Patient: Elizabeth Martin [id number] DOB: May 13, 1961 DOA: 02/11/2023     1 DOS: the patient was seen and examined on 02/12/2023   Brief hospital course: Ms. Elizabeth Martin is a 62 year old female with history of small bowel obstruction, ANA positive polyarthralgia, Raynaud's disease without gangrene, long-term use of immunosuppressant medication, hypertension, history of cholecystectomy, loop ileostomy, depression, anxiety, who presents emergency department for chief concerns of abdominal pain, nausea, vomiting. Patient had prior episodes of SBO in August/July 2023.  Vitals in the ED showed temperature of 98.5, respiration rate of 16, heart rate 87, blood pressure 144/72, SpO2 of 92% on room air.  Serum sodium of 136, potassium 4.0, chloride 103, bicarb 25, BUN of 13, serum creatinine of 0.56, EGFR greater than 60, nonfasting blood glucose 114, WBC 11.5, hemoglobin 13.6, platelets 342.  Lipase 83 COVID/influenza A/influenza B/RSV PCR were negative. UA was negative for leukocytes and nitrates. Lipase was 83. CT abdomen and pelvis with diffuse distention of small bowel, moderately dilated at the anastomotic site in the distal ileum with a tapered transition zone, findings are most compatible with small bowel obstruction.  Large volume of stool in the colon.  ED treatment: Sodium chloride 1 L bolus.  Morphine 4 mg IV one-time dose, Reglan 10 mg IV.  General surgery was consulted and NG tube was placed.  Surgery also started her on enemas for concern of severe constipation contributing to current symptoms.  3/9: Hemodynamically stable.  Labs with resolution of leukocytosis.  Mildly elevated AST and ALT.patient was having multiple bowel movements and NG with minimum secretions. Surgery is planning Gastrografin studies.    Assessment and Plan: * Small bowel obstruction ([place]) Conservative medical management at this time per general surgery. Significant stool burden in colon-started  getting bowel movements.  Patient was given enema in the hospital and she already took some laxatives at home. NG tube in place with minimum secretions. Surgery is planning Gastrografin studies today. -Continue with supportive care  Abdominal pain Secondary to small bowel obstruction, treat per above  Essential hypertension Home lisinopril 20 mg daily, chlorthalidone 50 mg every morning were not resumed on admission Hydralazine 5 mg IV to 6 hours as needed for SBP greater 175, 5 days ordered  Gastroesophageal reflux disease Protonix 40 mg IV twice daily, 2 days ordered  Polyarthralgia Continue routine outpatient rheumatology follow-up [name] 200 mg p.o. twice daily methotrexate 12.5 mg weekly and folic acid 1 mg daily have been held on admission   Subjective: Patient denies any nausea, vomiting or abdominal pain when seen today.  Having multiple bowel movements.  Physical Exam: Vitals:   02/11/23 1823 02/11/23 1945 02/12/23 0537 02/12/23 0902  BP: 124/63 (!) 140/74 134/70 134/81  Pulse: 79 80 85 76  Resp: '18 20 20 16  '$ Temp: 98.4 F (36.9 C) 98.3 F (36.8 C) 98.1 F (36.7 C) 99.1 F (37.3 C)  TempSrc: Oral Oral Oral   SpO2: 93% 95% 94% 94%  Weight:      Height:       General.  Well-developed lady, in no acute distress.  NG tube in place Pulmonary.  Lungs clear bilaterally, normal respiratory effort. CV.  Regular rate and rhythm, no JVD, rub or murmur. Abdomen.  Soft, nontender, nondistended, BS positive. CNS.  Alert and oriented .  No focal neurologic deficit. Extremities.  No edema, no cyanosis, pulses intact and symmetrical. Psychiatry.  Judgment and insight appears normal.   Data Reviewed: Prior data reviewed  Family Communication: Discussed  with husband at bedside  Disposition: Status is: Inpatient Remains inpatient appropriate because: Severity of illness  Planned Discharge Destination: Home  DVT prophylaxis.  Subcu heparin Time spent: 44  minutes   This record has been created using Systems analyst. Errors have been sought and corrected,but may not always be located. Such creation errors do not reflect on the standard of care.   Author: Lorella Nimrod, MD 02/12/2023 2:36 PM  For on call review [email or web address].

## 2023-02-12 NOTE — Progress Notes (Signed)
02/12/2023  Subjective: No acute events overnight.  This morning the patient denies any abdominal pain.  She had a few bowel movements last night which she thinks is due to all the bowel regimen medications that she took at home prior to coming to the emergency room.  She did not require any enemas.  X-ray this morning still shows some distended loops of small bowel.  Vital signs: Temp:  [98.1 F (36.7 C)-99.1 F (37.3 C)] 99.1 F (37.3 C) (03/09 0902) Pulse Rate:  [76-85] 76 (03/09 0902) Resp:  [16-20] 16 (03/09 0902) BP: (124-140)/(63-81) 134/81 (03/09 0902) SpO2:  [93 %-95 %] 94 % (03/09 0902)   Intake/Output: 03/08 0701 - 03/09 0700 In: 157 [I.V.:117; NG/GT:40] Out: 150 [Emesis/NG output:150] Last BM Date : 02/12/23  Physical Exam: Constitutional: No acute distress Abdomen: Soft, still a little bit distended, but currently nontender.  NG tube is in place.  Labs:  Recent Labs    02/11/23 1101 02/12/23 0624  WBC 11.5* 8.9  HGB 13.6 11.9*  HCT 42.2 36.3  PLT 342 279   Recent Labs    02/11/23 1101 02/12/23 0624  NA 136 140  K 4.0 3.7  CL 103 106  CO2 25 26  GLUCOSE 114* 97  BUN 13 9  CREATININE 0.56 0.51  CALCIUM 9.3 8.7*   No results for input(s): "LABPROT", "INR" in the last 72 hours.  Imaging: DG Abd 2 Views  Result Date: 02/12/2023 CLINICAL DATA:  62 year old female with evidence of small bowel obstruction on CT Abdomen and Pelvis yesterday. EXAM: ABDOMEN - 2 VIEW COMPARISON:  CT Abdomen and Pelvis and KUB 02/11/2023. FINDINGS: Portable AP supine views at 0645 hours. Stable enteric tube terminating in the gastric fundus. Lung bases appear negative. Stable cholecystectomy clips. Bowel-gas pattern not significantly changed from the CT scout view yesterday, with dilated mostly fluid-filled small bowel better demonstrated by CT. Stable abdominal and pelvic visceral contours. No pneumoperitoneum evident on these supine views. No acute osseous abnormality identified.  IMPRESSION: 1. Stable enteric tube. 2. Bowel-gas pattern not significantly changed from the CT scout view yesterday, with dilated fluid-filled small bowel better demonstrated by CT. Partial small bowel obstruction is possible. Electronically Signed   By: Genevie Ann M.D.   On: 02/12/2023 07:00   DG Abd Portable 1 View  Result Date: 02/11/2023 CLINICAL DATA:  NG tube placement. EXAM: PORTABLE ABDOMEN - 1 VIEW COMPARISON:  02/11/2023. FINDINGS: Enteric tube tip and side port project over the stomach. Mild dilation of small bowel loops in the left upper quadrant. Large burden of stool in the colon. Excreted contrast in the renal collecting systems. IMPRESSION: Enteric tube tip and side port project over the stomach. Electronically Signed   By: Emmit Alexanders M.D.   On: 02/11/2023 15:07   CT ABDOMEN PELVIS W CONTRAST  Result Date: 02/11/2023 CLINICAL DATA:  Nausea, vomiting, constipation EXAM: CT ABDOMEN AND PELVIS WITH CONTRAST TECHNIQUE: Multidetector CT imaging of the abdomen and pelvis was performed using the standard protocol following bolus administration of intravenous contrast. RADIATION DOSE REDUCTION: This exam was performed according to the departmental dose-optimization program which includes automated exposure control, adjustment of the mA and/or kV according to patient size and/or use of iterative reconstruction technique. CONTRAST:  163m OMNIPAQUE IOHEXOL 300 MG/ML  SOLN COMPARISON:  CT 12/04/2022 FINDINGS: Lower chest: No acute abnormality. Hepatobiliary: No focal liver abnormality is identified. Prior cholecystectomy with mild to moderate intra and extrahepatic biliary dilatation. Pancreas: Unremarkable. No pancreatic ductal dilatation or  surrounding inflammatory changes. Spleen: Normal in size without focal abnormality. Adrenals/Urinary Tract: Unremarkable adrenal glands. Kidneys enhance symmetrically without focal lesion, stone, or hydronephrosis. Ureters are nondilated. Urinary bladder appears  unremarkable for the degree of distention. Stomach/Bowel: Small hiatal hernia. Stomach is otherwise unremarkable. Diffuse distention of the small bowel, the majority of which measures up to approximately 3.1 cm in diameter. Distal small bowel at the anastomotic site is focally dilated up to 4.9 cm immediately upstream from a area of somewhat tapered transition to decreased caliber terminal ileum (series 2, image 68). Extensive fecalization of bowel upstream from this area of transition. Multiple air-fluid levels. Very large volume of stool throughout the colon. Prior rectosigmoid anastomosis. A normal appendix is seen in the right lower quadrant. No focal bowel wall thickening or inflammatory changes. Vascular/Lymphatic: Scattered aortoiliac atherosclerotic calcifications without aneurysm. No abdominopelvic lymphadenopathy. Reproductive: Status post hysterectomy. No adnexal masses. Other: Small volume ascites in the abdomen and pelvis, likely reactive. No organized abdominopelvic fluid collection. No pneumoperitoneum. Musculoskeletal: No acute or significant osseous findings. IMPRESSION: 1. Diffuse distention of the small bowel, moderately dilated at the anastomotic site in the distal ileum measuring up to 4.9 cm, where there is a tapered transition zone. Extensive fecalization of bowel upstream from this area of transition with multiple air-fluid levels. Findings are most compatible with a small bowel obstruction, which may be partial. 2. Very large volume of stool throughout the colon. 3. Small volume ascites in the abdomen and pelvis, likely reactive. 4. Prior cholecystectomy with mild to moderate intra and extrahepatic biliary dilatation. Correlate with LFTs. 5. Aortic atherosclerosis (ICD10-I70.0). Electronically Signed   By: Davina Poke D.O.   On: 02/11/2023 13:59    Assessment/Plan: This is a 62 y.o. female with small bowel obstruction.  - The patient had a few bowel movements yesterday which I  think are related to the magnesium citrate and other medications that she took at home.  However her x-ray still looks similar to her CT scan from yesterday.  As such, I have ordered a Gastrografin challenge for her.  Will repeat KUB later on today as an 8-hour delay to see how the Gastrografin is moving.  If this continues to improve, then we potentially would be able to DC the NG tube tonight versus tomorrow. -Otherwise for now, continue n.p.o. and NG tube to suction and IV fluid hydration. - Will continue following with you.   I spent 35 minutes dedicated to the care of this patient on the date of this encounter to include pre-visit review of records, face-to-face time with the patient discussing diagnosis and management, and any post-visit coordination of care.  Melvyn Neth, [facility]

## 2023-02-12 NOTE — Progress Notes (Signed)
LR order has expired. Order received from Dr Hampton Abbot to reorder [facility]

## 2023-02-12 NOTE — Progress Notes (Signed)
Order received from Dr Amin to discontinue telemetry °

## 2023-02-13 DIAGNOSIS — K56609 Unspecified intestinal obstruction, unspecified as to partial versus complete obstruction: Secondary | ICD-10-CM | POA: Diagnosis not present

## 2023-02-13 MED ORDER — ACETAMINOPHEN 500 MG PO TABS
1000.0000 mg | ORAL_TABLET | Freq: Four times a day (QID) | ORAL | Status: DC | PRN
  Administered 2023-02-13: 1000 mg via ORAL
  Filled 2023-02-13: qty 2

## 2023-02-13 NOTE — Discharge Summary (Signed)
Physician Discharge Summary   Patient: Elizabeth Martin MRN: [id number] DOB: 1961-09-29  Admit date:     02/11/2023  Discharge date: 02/13/23  Discharge Physician: Lorella Nimrod   PCP: Dianne Dun, MD   Recommendations at discharge:  Please obtain CBC and BMP in 1 week Patient was taking both gabapentin and Lyrica-please review her medications. Follow-up with primary care provider Follow-up with general surgery  Discharge Diagnoses: Principal Problem:   Small bowel obstruction ([place]) Active Problems:   Abdominal pain   Essential hypertension   Gastroesophageal reflux disease   Polyarthralgia   Hospital Course: Elizabeth Martin is a 62 year old female with history of small bowel obstruction, ANA positive polyarthralgia, Raynaud's disease without gangrene, long-term use of immunosuppressant medication, hypertension, history of cholecystectomy, loop ileostomy, depression, anxiety, who presents emergency department for chief concerns of abdominal pain, nausea, vomiting. Patient had prior episodes of SBO in August/July 2023.  Vitals in the ED showed temperature of 98.5, respiration rate of 16, heart rate 87, blood pressure 144/72, SpO2 of 92% on room air.  Serum sodium of 136, potassium 4.0, chloride 103, bicarb 25, BUN of 13, serum creatinine of 0.56, EGFR greater than 60, nonfasting blood glucose 114, WBC 11.5, hemoglobin 13.6, platelets 342.  Lipase 83 COVID/influenza A/influenza B/RSV PCR were negative. UA was negative for leukocytes and nitrates. Lipase was 83. CT abdomen and pelvis with diffuse distention of small bowel, moderately dilated at the anastomotic site in the distal ileum with a tapered transition zone, findings are most compatible with small bowel obstruction.  Large volume of stool in the colon.  ED treatment: Sodium chloride 1 L bolus.  Morphine 4 mg IV one-time dose, Reglan 10 mg IV.  General surgery was consulted and NG tube was placed.  Surgery also started her  on enemas for concern of severe constipation contributing to current symptoms.  3/9: Hemodynamically stable.  Labs with resolution of leukocytosis.  Mildly elevated AST and ALT.patient was having multiple bowel movements and NG with minimum secretions. Surgery is planning Gastrografin studies.  3/10; vitals stable.  Gastrografin studies with contrast reaching up to rectum.  Nonobstructive bowel gas pattern.  NG tube was removed and patient was started on clear liquid diet last night.  Patient continued to tolerate advancement in diet and able to tolerate soft with lunch.  No more nausea or vomiting.  Continue to have bowel movements.  No abdominal pain.  Patient was also found to be on gabapentin and Lyrica both.  Per patient Lyrica was started recently, she was asked to stop taking gabapentin and discussed with her provider.  Patient will continue with rest of her home medications and need to have a close follow-up with her providers for further recommendations.    Assessment and Plan: * Small bowel obstruction ([place]) Conservative medical management at this time per general surgery. Significant stool burden in colon-started getting bowel movements.  Patient was given enema in the hospital and she already took some laxatives at home. NG tube in place with minimum secretions. Surgery is planning Gastrografin studies today. -Continue with supportive care  Abdominal pain Secondary to small bowel obstruction, treat per above  Essential hypertension Home lisinopril 20 mg daily, chlorthalidone 50 mg every morning were not resumed on admission Hydralazine 5 mg IV to 6 hours as needed for SBP greater 175, 5 days ordered  Gastroesophageal reflux disease Protonix 40 mg IV twice daily, 2 days ordered  Polyarthralgia Continue routine outpatient rheumatology follow-up Elizabeth Martin 200 mg p.o. twice daily methotrexate  12.5 mg weekly and folic acid 1 mg daily have been held on  admission   Consultants: General surgery Procedures performed: None Disposition: Home Diet recommendation:  Discharge Diet Orders (From admission, onward)     Start     Ordered   02/13/23 0000  Diet - low sodium heart healthy        02/13/23 1328           Cardiac diet DISCHARGE MEDICATION: Allergies as of 02/13/2023       Reactions   Carboplatin Anaphylaxis   ANAPHYLACTIC REACTION ANAPHYLACTIC REACTION   Latex Hives, Itching, Rash   Nitrofurantoin Nausea Only, Nausea And Vomiting   Silicone Itching, Rash   Wheat Other (See Comments)   Other reaction(s): Other (See Comments) Body aches and swelling Body aches and swelling   Fentanyl Hives, Itching, Rash        Medication List     STOP taking these medications    gabapentin 300 MG capsule Commonly known as: NEURONTIN       TAKE these medications    albuterol 108 (90 Base) MCG/ACT inhaler Commonly known as: VENTOLIN HFA Inhale 2 puffs into the lungs every 6 (six) hours as needed.   cetirizine 10 MG tablet Commonly known as: ZYRTEC Take 10 mg by mouth daily as needed.   chlorthalidone 50 MG tablet Commonly known as: HYGROTON Take 50 mg by mouth every morning.   desonide 0.05 % cream Commonly known as: DESOWEN Apply 1 Application topically 2 (two) times daily.   DULoxetine 30 MG capsule Commonly known as: CYMBALTA Take 90 mg by mouth daily.   dutasteride 0.5 MG capsule Commonly known as: AVODART Take 0.5 mg by mouth daily.   folic acid 1 MG tablet Commonly known as: FOLVITE Take 1 mg by mouth daily.   hydroxychloroquine 200 MG tablet Commonly known as: PLAQUENIL Take 200 mg by mouth 2 (two) times daily.   Klor-Con M10 10 MEQ tablet Generic drug: potassium chloride Take 10 mEq by mouth daily. What changed: Another medication with the same name was removed. Continue taking this medication, and follow the directions you see here.   lisinopril 40 MG tablet Commonly known as:  ZESTRIL Take 40 mg by mouth daily.   methotrexate 2.5 MG tablet Commonly known as: RHEUMATREX Take 5 tablets by mouth every 7 (seven) days.   multivitamin capsule Take 1 capsule by mouth daily.   omeprazole 40 MG capsule Commonly known as: PRILOSEC Take 40 mg by mouth daily.   ondansetron 8 MG disintegrating tablet Commonly known as: ZOFRAN-ODT Take 8 mg by mouth every 8 (eight) hours as needed.   oxyCODONE-acetaminophen 5-325 MG tablet Commonly known as: PERCOCET/ROXICET Take 1 tablet by mouth every 6 (six) hours as needed for severe pain.   pregabalin 100 MG capsule Commonly known as: LYRICA Take 100 mg by mouth 3 (three) times daily.   prochlorperazine 5 MG tablet Commonly known as: COMPAZINE Take 1 tablet (5 mg total) by mouth every 6 (six) hours as needed for nausea or vomiting.   promethazine 12.5 MG tablet Commonly known as: PHENERGAN Take 1 tablet (12.5 mg total) by mouth every 6 (six) hours as needed for nausea or vomiting.   triamcinolone 0.025 % cream Commonly known as: KENALOG Apply 1 Application topically 2 (two) times daily.        Follow-up Information     Olean Ree, MD Follow up.   Specialty: General Surgery Why: As needed Contact information: [place]  [address] [place] [address] [phone]         Dianne Dun, MD. Schedule an appointment as soon as possible for a visit in 1 week(s).   Specialty: Family Medicine Contact information: [place] [address] [phone]                Discharge Exam: Danley Danker Weights   02/11/23 1055  Weight: 72.6 kg   General.  Well-developed lady, in no acute distress. Pulmonary.  Lungs clear bilaterally, normal respiratory effort. CV.  Regular rate and rhythm, no JVD, rub or murmur. Abdomen.  Soft, nontender, nondistended, BS positive. CNS.  Alert and oriented .  No focal neurologic deficit. Extremities.  No edema, no cyanosis, pulses intact  and symmetrical. Psychiatry.  Judgment and insight appears normal.   Condition at discharge: stable  The results of significant diagnostics from this hospitalization (including imaging, microbiology, ancillary and laboratory) are listed below for reference.   Imaging Studies: DG Abd Portable 1V-Small Bowel Obstruction Protocol-initial, 8 hr delay  Result Date: 02/12/2023 CLINICAL DATA:  Small-bowel obstruction.  8 hour delay radiograph EXAM: PORTABLE ABDOMEN - 1 VIEW COMPARISON:  Abdominal radiograph dated 02/12/2023 at 6:45 a.m. FINDINGS: Enteric tube tip projects over the stomach. Right upper quadrant surgical clips. Right lower quadrant chain sutures. Enteric contrast reaches the level of the rectum. Excreted contrast material within the urinary bladder. Nonobstructive bowel gas pattern. No free air or pneumatosis. No abnormal radio-opaque calculi or mass effect. No acute or substantial osseous abnormality. The sacrum and coccyx are partially obscured by overlying bowel contents. Partially imaged lung bases are clear. IMPRESSION: Enteric contrast reaches the level of the rectum. Nonobstructive bowel gas pattern. Electronically Signed   By: Darrin Nipper M.D.   On: 02/12/2023 18:04   DG Abd 2 Views  Result Date: 02/12/2023 CLINICAL DATA:  62 year old female with evidence of small bowel obstruction on CT Abdomen and Pelvis yesterday. EXAM: ABDOMEN - 2 VIEW COMPARISON:  CT Abdomen and Pelvis and KUB 02/11/2023. FINDINGS: Portable AP supine views at 0645 hours. Stable enteric tube terminating in the gastric fundus. Lung bases appear negative. Stable cholecystectomy clips. Bowel-gas pattern not significantly changed from the CT scout view yesterday, with dilated mostly fluid-filled small bowel better demonstrated by CT. Stable abdominal and pelvic visceral contours. No pneumoperitoneum evident on these supine views. No acute osseous abnormality identified. IMPRESSION: 1. Stable enteric tube. 2. Bowel-gas  pattern not significantly changed from the CT scout view yesterday, with dilated fluid-filled small bowel better demonstrated by CT. Partial small bowel obstruction is possible. Electronically Signed   By: Genevie Ann M.D.   On: 02/12/2023 07:00   DG Abd Portable 1 View  Result Date: 02/11/2023 CLINICAL DATA:  NG tube placement. EXAM: PORTABLE ABDOMEN - 1 VIEW COMPARISON:  02/11/2023. FINDINGS: Enteric tube tip and side port project over the stomach. Mild dilation of small bowel loops in the left upper quadrant. Large burden of stool in the colon. Excreted contrast in the renal collecting systems. IMPRESSION: Enteric tube tip and side port project over the stomach. Electronically Signed   By: Emmit Alexanders M.D.   On: 02/11/2023 15:07   CT ABDOMEN PELVIS W CONTRAST  Result Date: 02/11/2023 CLINICAL DATA:  Nausea, vomiting, constipation EXAM: CT ABDOMEN AND PELVIS WITH CONTRAST TECHNIQUE: Multidetector CT imaging of the abdomen and pelvis was performed using the standard protocol following bolus administration of intravenous contrast. RADIATION DOSE REDUCTION: This exam was performed according to the departmental dose-optimization program  which includes automated exposure control, adjustment of the mA and/or kV according to patient size and/or use of iterative reconstruction technique. CONTRAST:  129m OMNIPAQUE IOHEXOL 300 MG/ML  SOLN COMPARISON:  CT 12/04/2022 FINDINGS: Lower chest: No acute abnormality. Hepatobiliary: No focal liver abnormality is identified. Prior cholecystectomy with mild to moderate intra and extrahepatic biliary dilatation. Pancreas: Unremarkable. No pancreatic ductal dilatation or surrounding inflammatory changes. Spleen: Normal in size without focal abnormality. Adrenals/Urinary Tract: Unremarkable adrenal glands. Kidneys enhance symmetrically without focal lesion, stone, or hydronephrosis. Ureters are nondilated. Urinary bladder appears unremarkable for the degree of distention.  Stomach/Bowel: Small hiatal hernia. Stomach is otherwise unremarkable. Diffuse distention of the small bowel, the majority of which measures up to approximately 3.1 cm in diameter. Distal small bowel at the anastomotic site is focally dilated up to 4.9 cm immediately upstream from a area of somewhat tapered transition to decreased caliber terminal ileum (series 2, image 68). Extensive fecalization of bowel upstream from this area of transition. Multiple air-fluid levels. Very large volume of stool throughout the colon. Prior rectosigmoid anastomosis. A normal appendix is seen in the right lower quadrant. No focal bowel wall thickening or inflammatory changes. Vascular/Lymphatic: Scattered aortoiliac atherosclerotic calcifications without aneurysm. No abdominopelvic lymphadenopathy. Reproductive: Status post hysterectomy. No adnexal masses. Other: Small volume ascites in the abdomen and pelvis, likely reactive. No organized abdominopelvic fluid collection. No pneumoperitoneum. Musculoskeletal: No acute or significant osseous findings. IMPRESSION: 1. Diffuse distention of the small bowel, moderately dilated at the anastomotic site in the distal ileum measuring up to 4.9 cm, where there is a tapered transition zone. Extensive fecalization of bowel upstream from this area of transition with multiple air-fluid levels. Findings are most compatible with a small bowel obstruction, which may be partial. 2. Very large volume of stool throughout the colon. 3. Small volume ascites in the abdomen and pelvis, likely reactive. 4. Prior cholecystectomy with mild to moderate intra and extrahepatic biliary dilatation. Correlate with LFTs. 5. Aortic atherosclerosis (ICD10-I70.0). Electronically Signed   By: NDavina PokeD.O.   On: 02/11/2023 13:59   DG Abdomen 1 View  Result Date: 02/11/2023 CLINICAL DATA:  Nausea, vomiting, constipation. EXAM: ABDOMEN - 1 VIEW COMPARISON:  May 20, 2022. FINDINGS: No abnormal bowel dilatation.  Large amount of stool seen throughout the colon. Status post cholecystectomy. No radio-opaque calculi or other significant radiographic abnormality are seen. IMPRESSION: Large stool burden.  No abnormal bowel dilatation. Electronically Signed   By: JMarijo ConceptionM.D.   On: 02/11/2023 11:22    Microbiology: Results for orders placed or performed during the hospital encounter of 02/11/23  Resp panel by RT-PCR (RSV, Flu A&B, Covid) Anterior Nasal Swab     Status: None   Collection Time: 02/11/23 12:19 PM   Specimen: Anterior Nasal Swab  Result Value Ref Range Status   SARS Coronavirus 2 by RT PCR NEGATIVE NEGATIVE Final    Comment: (NOTE) SARS-CoV-2 target nucleic acids are NOT DETECTED.  The SARS-CoV-2 RNA is generally detectable in upper respiratory specimens during the acute phase of infection. The lowest concentration of SARS-CoV-2 viral copies this assay can detect is 138 copies/mL. A negative result does not preclude SARS-Cov-2 infection and should not be used as the sole basis for treatment or other patient management decisions. A negative result may occur with  improper specimen collection/handling, submission of specimen other than nasopharyngeal swab, presence of viral mutation(s) within the areas targeted by this assay, and inadequate number of viral copies(<138 copies/mL). A negative result  must be combined with clinical observations, patient history, and epidemiological information. The expected result is Negative.  Fact Sheet for Patients:  EntrepreneurPulse.com.au  Fact Sheet for Healthcare Providers:  IncredibleEmployment.be  This test is no t yet approved or cleared by the [place] FDA and  has been authorized for detection and/or diagnosis of SARS-CoV-2 by FDA under an Emergency Use Authorization (EUA). This EUA will remain  in effect (meaning this test can be used) for the duration of the COVID-19 declaration under Section  564(b)(1) of the Act, 21 U.S.C.section 360bbb-3(b)(1), unless the authorization is terminated  or revoked sooner.       Influenza A by PCR NEGATIVE NEGATIVE Final   Influenza B by PCR NEGATIVE NEGATIVE Final    Comment: (NOTE) The Xpert Xpress SARS-CoV-2/FLU/RSV plus assay is intended as an aid in the diagnosis of influenza from Nasopharyngeal swab specimens and should not be used as a sole basis for treatment. Nasal washings and aspirates are unacceptable for Xpert Xpress SARS-CoV-2/FLU/RSV testing.  Fact Sheet for Patients: EntrepreneurPulse.com.au  Fact Sheet for Healthcare Providers: IncredibleEmployment.be  This test is not yet approved or cleared by the [place] FDA and has been authorized for detection and/or diagnosis of SARS-CoV-2 by FDA under an Emergency Use Authorization (EUA). This EUA will remain in effect (meaning this test can be used) for the duration of the COVID-19 declaration under Section 564(b)(1) of the Act, 21 U.S.C. section 360bbb-3(b)(1), unless the authorization is terminated or revoked.     Resp Syncytial Virus by PCR NEGATIVE NEGATIVE Final    Comment: (NOTE) Fact Sheet for Patients: EntrepreneurPulse.com.au  Fact Sheet for Healthcare Providers: IncredibleEmployment.be  This test is not yet approved or cleared by the [place] FDA and has been authorized for detection and/or diagnosis of SARS-CoV-2 by FDA under an Emergency Use Authorization (EUA). This EUA will remain in effect (meaning this test can be used) for the duration of the COVID-19 declaration under Section 564(b)(1) of the Act, 21 U.S.C. section 360bbb-3(b)(1), unless the authorization is terminated or revoked.  Performed at [facility], [address] [place],  [address]     Labs: CBC: Recent Labs  Lab 02/11/23 1101 02/12/23 0624  WBC 11.5* 8.9  HGB 13.6 11.9*  HCT 42.2  36.3  MCV 91.3 91.7  PLT 342 [id number]   Basic Metabolic Panel: Recent Labs  Lab 02/11/23 1101 02/12/23 0624  NA 136 140  K 4.0 3.7  CL 103 106  CO2 25 26  GLUCOSE 114* 97  BUN 13 9  CREATININE 0.56 0.51  CALCIUM 9.3 8.7*  MG  --  2.0  PHOS  --  3.6   Liver Function Tests: Recent Labs  Lab 02/11/23 1101 02/12/23 0624  AST 22 60*  ALT 17 63*  ALKPHOS 74 88  BILITOT 0.6 1.0  PROT 7.3 5.9*  ALBUMIN 4.1 3.4*   CBG: No results for input(s): "GLUCAP" in the last 168 hours.  Discharge time spent: greater than 30 minutes.  This record has been created using Systems analyst. Errors have been sought and corrected,but may not always be located. Such creation errors do not reflect on the standard of care.   Signed: Lorella Nimrod, MD [facility] 02/13/2023

## 2023-02-13 NOTE — Progress Notes (Signed)
Order received from Dr Reesa Chew to discontinue IVF

## 2023-02-13 NOTE — Progress Notes (Signed)
Patient tolerating clear liquid diet. Order given from Dr Reesa Chew to increase diet to full liquid

## 2023-02-13 NOTE — Discharge Instructions (Signed)
Discharge Instructions: --Please continue your fiber intake and add more fluid intake by mouth and can also start using Miralax once daily to help with daily, regular, soft bowel movements.  If having diarrhea, then may change the Miralax to every other day instead of daily.

## 2023-02-13 NOTE — Progress Notes (Signed)
02/13/2023  Subjective: No acute events overnight.  Gastrografin challenge was successful and the patient had multiple bowel movements yesterday.  8-hour delay KUB showed the Gastrografin within the colon and rectum.  Patient denies any abdominal pain or nausea.  Vital signs: Temp:  [97.8 F (36.6 C)-98.5 F (36.9 C)] 98.1 F (36.7 C) (03/10 0755) Pulse Rate:  [68-82] 68 (03/10 0755) Resp:  [16-18] 16 (03/10 0755) BP: (125-142)/(69-84) 125/84 (03/10 0755) SpO2:  [95 %-98 %] 98 % (03/10 0755)   Intake/Output: 03/09 0701 - 03/10 0700 In: 1714.4 [I.V.:1594.4; NG/GT:120] Out: 400 [Emesis/NG output:400] Last BM Date : 02/12/23  Physical Exam: Constitutional: No acute distress Abdomen: Soft, nondistended, nontender to palpation.  Labs:  Recent Labs    02/11/23 1101 02/12/23 0624  WBC 11.5* 8.9  HGB 13.6 11.9*  HCT 42.2 36.3  PLT 342 279   Recent Labs    02/11/23 1101 02/12/23 0624  NA 136 140  K 4.0 3.7  CL 103 106  CO2 25 26  GLUCOSE 114* 97  BUN 13 9  CREATININE 0.56 0.51  CALCIUM 9.3 8.7*   No results for input(s): "LABPROT", "INR" in the last 72 hours.  Imaging: DG Abd Portable 1V-Small Bowel Obstruction Protocol-initial, 8 hr delay  Result Date: 02/12/2023 CLINICAL DATA:  Small-bowel obstruction.  8 hour delay radiograph EXAM: PORTABLE ABDOMEN - 1 VIEW COMPARISON:  Abdominal radiograph dated 02/12/2023 at 6:45 a.m. FINDINGS: Enteric tube tip projects over the stomach. Right upper quadrant surgical clips. Right lower quadrant chain sutures. Enteric contrast reaches the level of the rectum. Excreted contrast material within the urinary bladder. Nonobstructive bowel gas pattern. No free air or pneumatosis. No abnormal radio-opaque calculi or mass effect. No acute or substantial osseous abnormality. The sacrum and coccyx are partially obscured by overlying bowel contents. Partially imaged lung bases are clear. IMPRESSION: Enteric contrast reaches the level of the rectum.  Nonobstructive bowel gas pattern. Electronically Signed   By: Darrin Nipper M.D.   On: 02/12/2023 18:04    Assessment/Plan: This is a 62 y.o. female with small bowel obstruction.  -Patient's Gastrografin challenge was successful yesterday and she had further bowel movements with KUB showing the contrast in the colon and rectum.  Today the patient continues to feel well and denies any abdominal pain or nausea.  She still having bowel function. - Patient was advanced to full liquid diet for breakfast which she tolerated well except for the pudding due to the high sugar amount.  Will advance her to a soft diet for lunch and I think if she continues to do well, she should be able to be discharged in the afternoon from a surgical standpoint. - Reinforced with her that going forwards she does need to maintain a bowel regimen that is consistent adding fiber to her diet as well as MiraLAX to help with regular bowel movements. - Follow-up as needed.   I spent 35 minutes dedicated to the care of this patient on the date of this encounter to include pre-visit review of records, face-to-face time with the patient discussing diagnosis and management, and any post-visit coordination of care.  Melvyn Neth, [facility]

## 2023-02-13 NOTE — Progress Notes (Signed)
Discharge instructions reviewed with the patient. Patient will be taken out via wheelchair

## 2023-02-18 ENCOUNTER — Ambulatory Visit: Payer: BC Managed Care – PPO | Admitting: Urology

## 2023-03-07 ENCOUNTER — Other Ambulatory Visit: Payer: Self-pay

## 2023-03-07 ENCOUNTER — Ambulatory Visit (INDEPENDENT_AMBULATORY_CARE_PROVIDER_SITE_OTHER): Payer: BC Managed Care – PPO | Admitting: Surgery

## 2023-03-07 ENCOUNTER — Encounter: Payer: Self-pay | Admitting: Surgery

## 2023-03-07 VITALS — BP 128/77 | HR 74 | Temp 98.3°F | Ht 67.0 in | Wt 166.0 lb

## 2023-03-07 DIAGNOSIS — A770 Spotted fever due to Rickettsia rickettsii: Secondary | ICD-10-CM | POA: Insufficient documentation

## 2023-03-07 DIAGNOSIS — K56609 Unspecified intestinal obstruction, unspecified as to partial versus complete obstruction: Secondary | ICD-10-CM | POA: Diagnosis not present

## 2023-03-07 DIAGNOSIS — R102 Pelvic and perineal pain: Secondary | ICD-10-CM | POA: Insufficient documentation

## 2023-03-07 DIAGNOSIS — S86119A Strain of other muscle(s) and tendon(s) of posterior muscle group at lower leg level, unspecified leg, initial encounter: Secondary | ICD-10-CM | POA: Insufficient documentation

## 2023-03-07 DIAGNOSIS — R0600 Dyspnea, unspecified: Secondary | ICD-10-CM | POA: Insufficient documentation

## 2023-03-07 DIAGNOSIS — R1030 Lower abdominal pain, unspecified: Secondary | ICD-10-CM | POA: Insufficient documentation

## 2023-03-07 NOTE — Progress Notes (Signed)
03/07/2023  History of Present Illness: Elizabeth Martin is a 62 y.o. female s/p robotic assisted sigmoidectomy with diverting loop ileostomy on 05/24/22.  She then had an ileostomy takedown on 08/24/22.  She was admitted on 02/11/23 with what appeared as a small bowel obstruction on CT scan.  However, she had a lot of stool burden in her colon and in her small bowel.  This resolved with gastrograffin and bowel rest.   After her hospitalization, she reports that she's been keeping up with a high fiber diet and miralax.  She will take Dulcolax if she feels that she's not had a bowel movement or if the stool is getting hard.  This will help.  She reports that she still gets a sensation of pain in the left abdomen and bloatedness/gassy.  She's having daily bowel movements so far.  Past Medical History: Past Medical History:  Diagnosis Date   Asthma    Autoimmune disease    Cancer    Endometrial lymph node removal (30)   Corneal abrasion, right 05/24/2022   GERD (gastroesophageal reflux disease)    H/O blood clots    upper rt groin and behind both knees   History of hiatal hernia    Hypertension    Lymphedema    right leg   Morphea scleroderma    PONV (postoperative nausea and vomiting)    states gets violently ill     Past Surgical History: Past Surgical History:  Procedure Laterality Date   BASAL CELL CARCINOMA EXCISION Left    CHOLECYSTECTOMY  2008   FLEXIBLE SIGMOIDOSCOPY N/A 05/19/2022   Procedure: FLEXIBLE SIGMOIDOSCOPY;  Surgeon: Toledo, Benay Pike, MD;  Location: [facility];  Service: Gastroenterology;  Laterality: N/A;   HERNIA REPAIR  2004   ILEOSTOMY CLOSURE N/A 08/24/2022   Procedure: ILEOSTOMY TAKEDOWN, open loop with Edison Simon, PA-C to assist;  Surgeon: Olean Ree, MD;  Location: [facility];  Service: General;  Laterality: N/A;   PLANTAR FASCIECTOMY     ROBOTIC ASSISTED TOTAL HYSTERECTOMY WITH BILATERAL SALPINGO OOPHERECTOMY  02/14/2012   ROTATOR CUFF REPAIR Left  03/24/2022   TONSILLECTOMY     XI ROBOTIC ASSISTED LOWER ANTERIOR RESECTION N/A 05/24/2022   Procedure: XI ROBOTIC ASSISTED LOWER ANTERIOR RESECTION;  Surgeon: Olean Ree, MD;  Location: [facility];  Service: General;  Laterality: N/A;    Home Medications: Prior to Admission medications   Medication Sig Start Date End Date Taking? Authorizing Provider  albuterol (VENTOLIN HFA) 108 (90 Base) MCG/ACT inhaler Inhale 2 puffs into the lungs every 6 (six) hours as needed. 01/11/22   [provider]  cetirizine (ZYRTEC) 10 MG tablet Take 10 mg by mouth daily as needed. 08/10/21   [provider]  chlorthalidone (HYGROTON) 50 MG tablet Take 50 mg by mouth every morning. 11/08/21   [provider]  desonide (DESOWEN) 0.05 % cream Apply 1 Application topically 2 (two) times daily. 01/31/23   [provider]  DULoxetine (CYMBALTA) 30 MG capsule Take 90 mg by mouth daily. 11/16/21   [provider]  dutasteride (AVODART) 0.5 MG capsule Take 0.5 mg by mouth daily. 02/17/22   [provider]  folic acid (FOLVITE) 1 MG tablet Take 1 mg by mouth daily. 07/13/22   [provider]  hydroxychloroquine (PLAQUENIL) 200 MG tablet Take 200 mg by mouth 2 (two) times daily. 01/11/22   [provider]  lisinopril (ZESTRIL) 40 MG tablet Take 40 mg by mouth daily. 12/29/22   [provider]  methotrexate (RHEUMATREX) 2.5 MG tablet Take 5 tablets by mouth every 7 (seven) days. 08/08/22   [provider]  Multiple Vitamin (MULTIVITAMIN) capsule Take 1 capsule by mouth daily.    [provider]  omeprazole (PRILOSEC) 40 MG capsule Take 40 mg by mouth daily. 12/29/22   [provider]  ondansetron (ZOFRAN-ODT) 8 MG disintegrating tablet Take 8 mg by mouth every 8 (eight) hours as needed. 12/29/22   [provider]  pregabalin (LYRICA) 100 MG capsule Take 100 mg by mouth 3 (three) times daily. 02/10/23   [provider]   prochlorperazine (COMPAZINE) 5 MG tablet Take 1 tablet (5 mg total) by mouth every 6 (six) hours as needed for nausea or vomiting. 05/27/22   Tylene Fantasia, PA-C  promethazine (PHENERGAN) 12.5 MG tablet Take 1 tablet (12.5 mg total) by mouth every 6 (six) hours as needed for nausea or vomiting. 08/26/22   Tylene Fantasia, PA-C  triamcinolone (KENALOG) 0.025 % cream Apply 1 Application topically 2 (two) times daily. 12/15/22   [provider]    Allergies: Allergies  Allergen Reactions   Carboplatin Anaphylaxis    ANAPHYLACTIC REACTION ANAPHYLACTIC REACTION    Hydromorphone Anaphylaxis    Stopped breathing  Respiratory arrest - due to dosage administered per the patient.   Latex Hives, Itching and Rash   Nitrofurantoin Nausea Only and Nausea And Vomiting   Silicone Itching and Rash   Wheat Other (See Comments)    Other reaction(s): Other (See Comments) Body aches and swelling Body aches and swelling    Fentanyl Hives, Itching and Rash    Review of Systems: Review of Systems  Constitutional:  Negative for chills and fever.  Respiratory:  Negative for shortness of breath.   Cardiovascular:  Negative for chest pain.  Gastrointestinal:  Positive for abdominal pain. Negative for blood in stool, constipation, nausea and vomiting.    Physical Exam BP 128/77   Pulse 74   Temp 98.3 F (36.8 C) (Oral)   Ht 5\' 7"  (1.702 m)   Wt 166 lb (75.3 kg)   SpO2 94%   BMI 26.00 kg/m  CONSTITUTIONAL: No acute distress, well nourished. HEENT:  Normocephalic, atraumatic, extraocular motion intact. RESPIRATORY:  Normal respiratory effort without pathologic use of accessory muscles. CARDIOVASCULAR: Regular rhythm and rate. GI: The abdomen is soft, non-distended, with some discomfort in the left abdomen, but no peritonitis.  Incisions are well healed without evidence of hernia. NEUROLOGIC:  Motor and sensation is grossly normal.  Cranial nerves are grossly intact. PSYCH:  Alert  and oriented to person, place and time. Affect is normal.  Labs/Imaging: CT abdomen/pelvis on 02/11/23: IMPRESSION: 1. Diffuse distention of the small bowel, moderately dilated at the anastomotic site in the distal ileum measuring up to 4.9 cm, where there is a tapered transition zone. Extensive fecalization of bowel upstream from this area of transition with multiple air-fluid levels. Findings are most compatible with a small bowel obstruction, which may be partial. 2. Very large volume of stool throughout the colon. 3. Small volume ascites in the abdomen and pelvis, likely reactive. 4. Prior cholecystectomy with mild to moderate intra and extrahepatic biliary dilatation. Correlate with LFTs. 5. Aortic atherosclerosis (ICD10-I70.0).  Assessment and Plan: This is a 62 y.o. female with recent small bowel obstruction  --Discussed with the patient that given that she's still having some symptoms, it would be useful to obtain a small bowel follow through study.  This may show if there are any kinks  in the small bowel that may be causing her symptoms.  However, discussed with her that on her CT scan she had a significant stool burden not just in her small bowel, but also her colon.  Her issue may be more functional in nature.  If that is the case, then she would need a referral to GI.  Once the SBFT is done, I will contact her with the results.  If there is something that would require surgical management, then we can also discuss timing for surgery.  The patient has an upcoming back surgery next month and then a trip to [place] in June.  If any surgery is needed, could possibly wait until after her trip. --Continue for now her Miralax, high fiber, and dulcolax as needed.  I spent 30 minutes dedicated to the care of this patient on the date of this encounter to include pre-visit review of records, face-to-face time with the patient discussing diagnosis and management, and any post-visit coordination  of care.   Melvyn Neth, [facility]

## 2023-03-07 NOTE — Patient Instructions (Addendum)
I will call you tomorrow and let you know when your Small Bowel study can be scheduled.

## 2023-03-08 ENCOUNTER — Other Ambulatory Visit: Payer: Self-pay | Admitting: Surgery

## 2023-03-08 ENCOUNTER — Encounter: Payer: Self-pay | Admitting: Surgery

## 2023-03-08 ENCOUNTER — Telehealth: Payer: Self-pay

## 2023-03-08 DIAGNOSIS — K56609 Unspecified intestinal obstruction, unspecified as to partial versus complete obstruction: Secondary | ICD-10-CM

## 2023-03-08 NOTE — Telephone Encounter (Signed)
Small bowel study scheduled @ [place] on . Nothing to eat / drink after midnight. 03/09/2023 @ 8:30 am @ [facility] .  Dr.Piscoya 03/14/2023 @ 2:15 pm.

## 2023-03-09 ENCOUNTER — Ambulatory Visit
Admission: RE | Admit: 2023-03-09 | Discharge: 2023-03-09 | Disposition: A | Discharge status: Home / Self Care | Payer: BC Managed Care – PPO | Source: Ambulatory Visit | Attending: Surgery | Admitting: Surgery

## 2023-03-09 DIAGNOSIS — K56609 Unspecified intestinal obstruction, unspecified as to partial versus complete obstruction: Secondary | ICD-10-CM | POA: Insufficient documentation

## 2023-03-09 NOTE — Progress Notes (Signed)
Released results of SBFT via MyChart and called patient and left message with results.  Will order GI referral for further evaluation.  Olean Ree, MD

## 2023-03-10 ENCOUNTER — Telehealth: Payer: Self-pay

## 2023-03-10 ENCOUNTER — Other Ambulatory Visit: Payer: Self-pay

## 2023-03-10 DIAGNOSIS — K56609 Unspecified intestinal obstruction, unspecified as to partial versus complete obstruction: Secondary | ICD-10-CM

## 2023-03-10 NOTE — Telephone Encounter (Signed)
Faxed referral to Dr. Alice Reichert with GI at [phone].

## 2023-03-11 ENCOUNTER — Ambulatory Visit: Payer: BC Managed Care – PPO | Admitting: Surgery

## 2023-03-14 ENCOUNTER — Ambulatory Visit: Payer: BC Managed Care – PPO | Admitting: Surgery

## 2023-03-15 DIAGNOSIS — K5730 Diverticulosis of large intestine without perforation or abscess without bleeding: Secondary | ICD-10-CM | POA: Insufficient documentation

## 2023-03-15 DIAGNOSIS — Z1211 Encounter for screening for malignant neoplasm of colon: Secondary | ICD-10-CM | POA: Insufficient documentation

## 2023-03-15 DIAGNOSIS — K5904 Chronic idiopathic constipation: Secondary | ICD-10-CM | POA: Insufficient documentation

## 2023-03-16 DIAGNOSIS — M5412 Radiculopathy, cervical region: Secondary | ICD-10-CM | POA: Insufficient documentation

## 2023-03-22 DIAGNOSIS — F32A Depression, unspecified: Secondary | ICD-10-CM | POA: Insufficient documentation

## 2023-06-10 DIAGNOSIS — M25661 Stiffness of right knee, not elsewhere classified: Secondary | ICD-10-CM | POA: Insufficient documentation

## 2023-06-16 DIAGNOSIS — Z9049 Acquired absence of other specified parts of digestive tract: Secondary | ICD-10-CM | POA: Insufficient documentation

## 2023-06-24 IMAGING — US US EXTREM LOW VENOUS*R*
1 series · 13 of 24 positions shown · non-contrast
Comparison: None.

CLINICAL DATA: Right calf pain.

EXAM:
RIGHT LOWER EXTREMITY VENOUS DOPPLER ULTRASOUND
TECHNIQUE: Gray-scale sonography with compression, as well as color and duplex
ultrasound, were performed to evaluate the deep venous system(s)
from the level of the common femoral vein through the popliteal and
proximal calf veins.

[Series 1: us venous img lower uni right (dvt) · portal-venous · 13 of 35 slices shown]
[im 1/35]
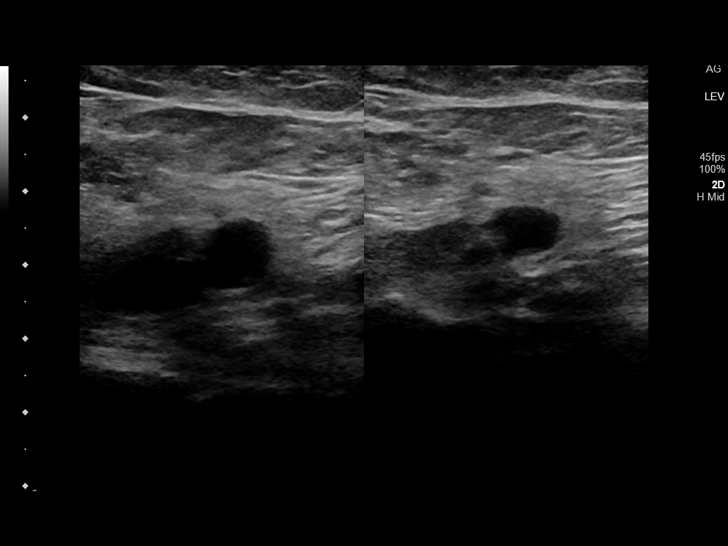
[im 3/35]
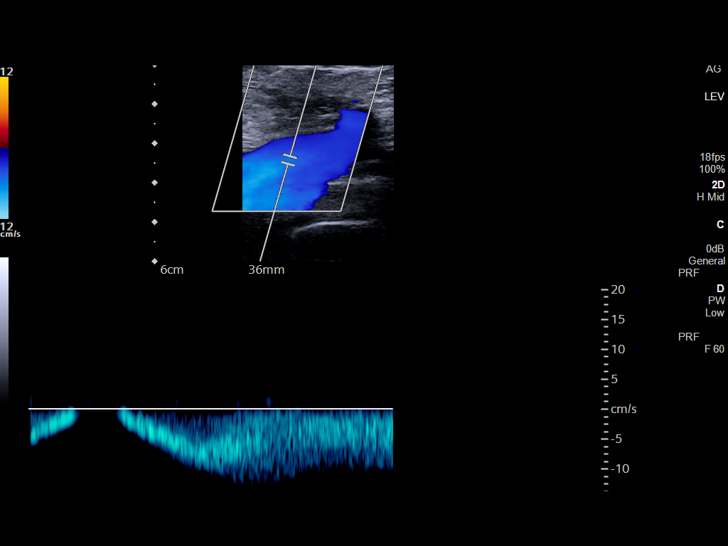
[im 6/35]
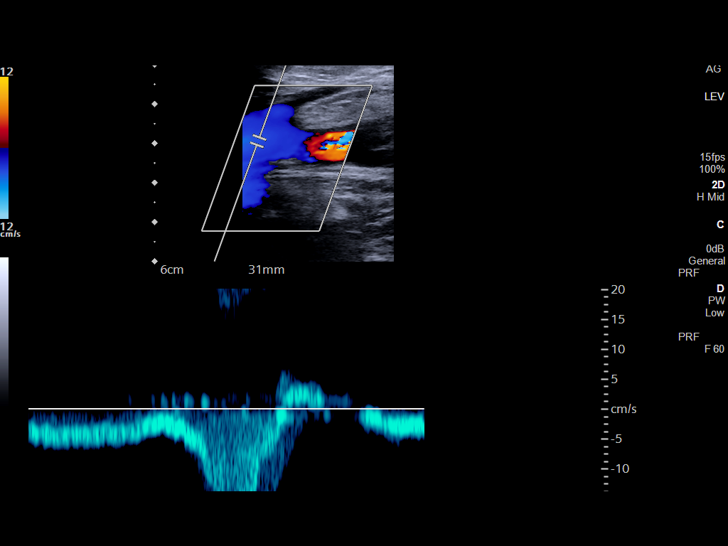
[im 9/35]
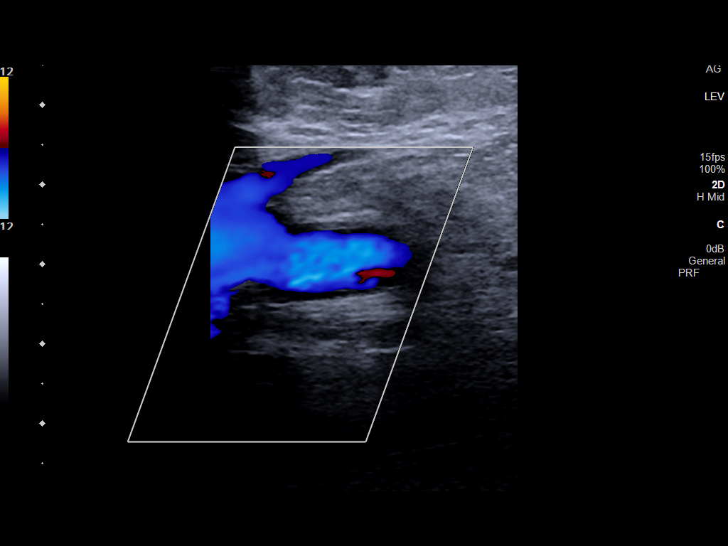
[im 12/35]
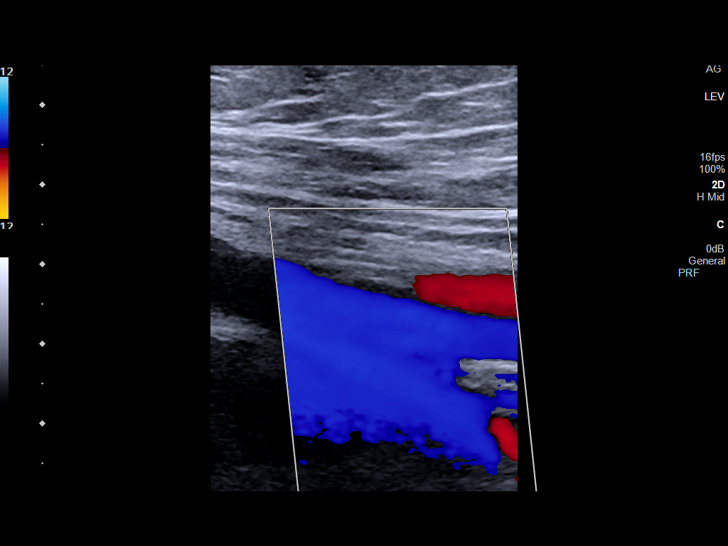
[im 15/35]
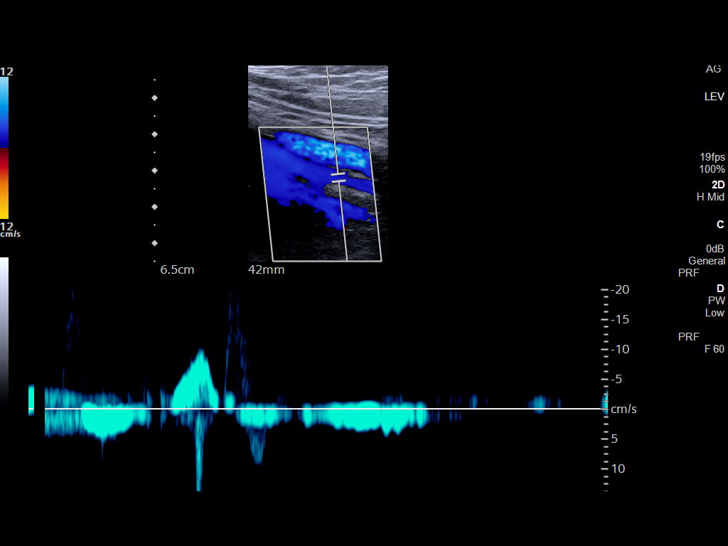
[im 18/35]
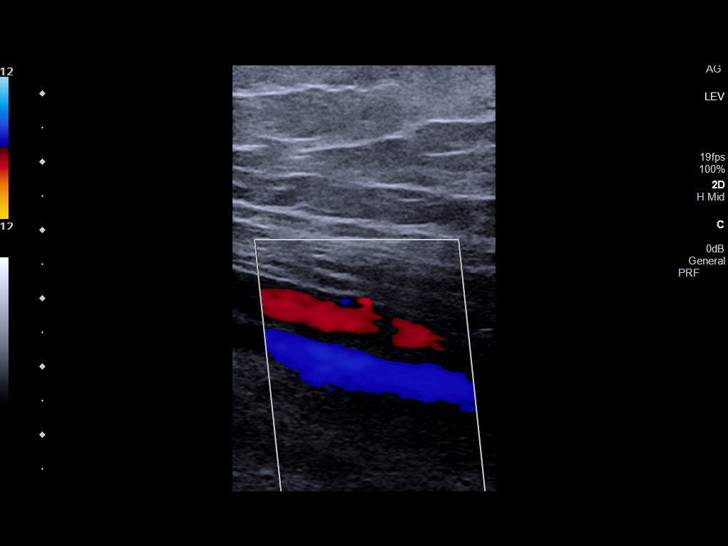
[im 20/35]
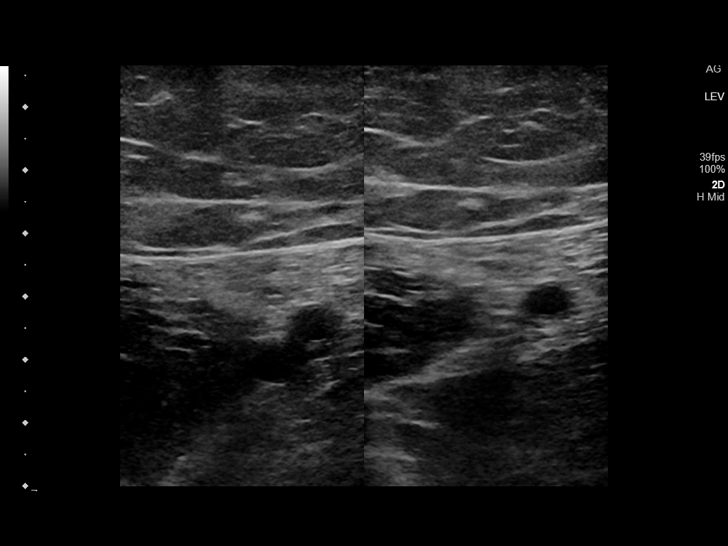
[im 23/35]
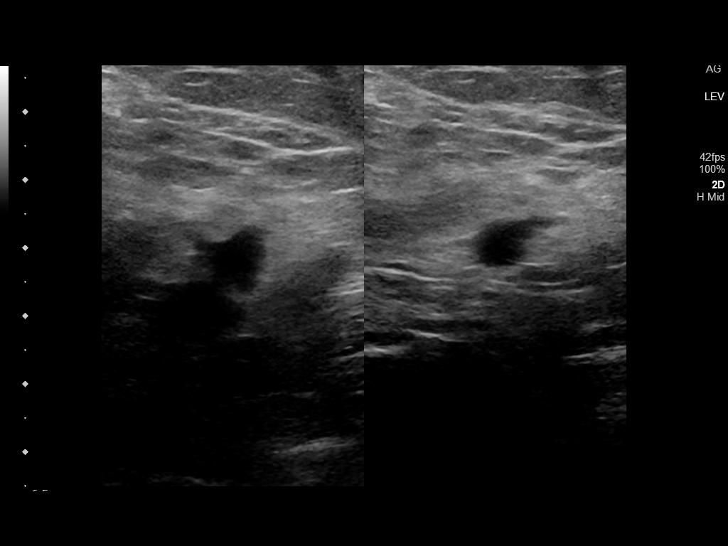
[im 26/35]
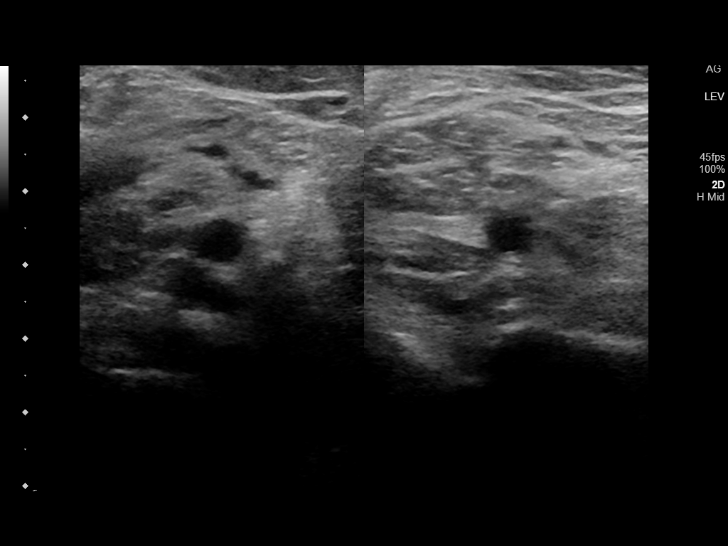
[im 29/35]
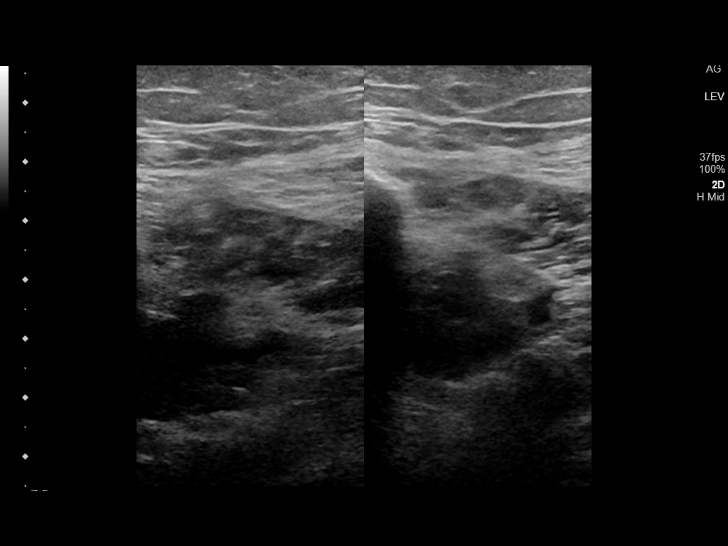
[im 32/35]
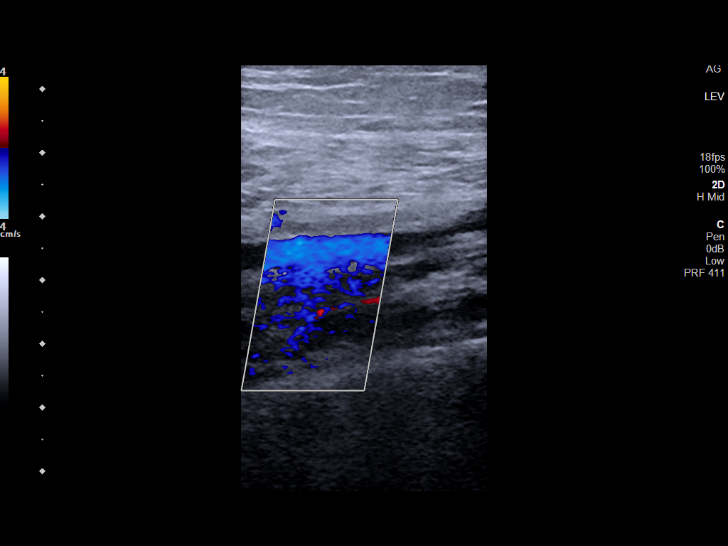
[im 35/35]
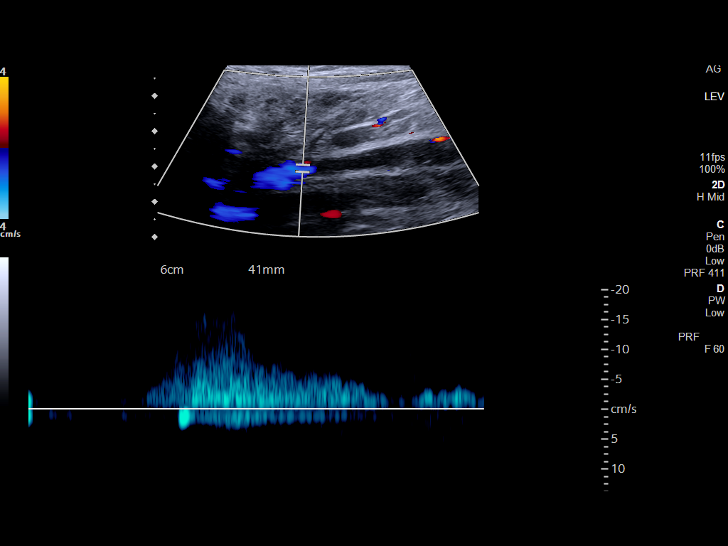

[13 of 24 positions shown; findings below may reference images not displayed]

FINDINGS: VENOUS

Normal compressibility of the common femoral, superficial femoral,
and popliteal veins. The posterior tibial and peroneal veins in the
calf are patent. Visualized portions of profunda femoral vein and
great saphenous vein unremarkable. No filling defects to suggest DVT
on grayscale or color Doppler imaging. Doppler waveforms show normal
direction of venous flow, normal respiratory plasticity and response
to augmentation. There is thrombus within the intramuscular
gastrocnemius vein that appears occlusive.

Limited views of the contralateral common femoral vein are
unremarkable.

OTHER

None.

Limitations: none
IMPRESSION: 1. There is thrombus within the intramuscular gastrocnemius vein in
the calf. The remaining right lower extremity veins are patent, with
no thrombus extension into the popliteal vein.
2. Consider short interval follow-up ultrasound in 3-5 days to
assess for propagation to the popliteal vein, particularly if
symptoms worsen.

Discussed with Dr Ginna

## 2023-08-05 ENCOUNTER — Ambulatory Visit: Payer: BC Managed Care – PPO

## 2023-08-05 DIAGNOSIS — Z980 Intestinal bypass and anastomosis status: Secondary | ICD-10-CM | POA: Diagnosis not present

## 2023-08-05 DIAGNOSIS — K219 Gastro-esophageal reflux disease without esophagitis: Secondary | ICD-10-CM | POA: Diagnosis not present

## 2023-08-05 DIAGNOSIS — Z1211 Encounter for screening for malignant neoplasm of colon: Secondary | ICD-10-CM | POA: Diagnosis present

## 2023-08-05 DIAGNOSIS — K2970 Gastritis, unspecified, without bleeding: Secondary | ICD-10-CM | POA: Diagnosis not present

## 2023-08-05 DIAGNOSIS — K449 Diaphragmatic hernia without obstruction or gangrene: Secondary | ICD-10-CM | POA: Diagnosis not present

## 2023-08-05 DIAGNOSIS — R110 Nausea: Secondary | ICD-10-CM | POA: Diagnosis not present

## 2023-08-12 ENCOUNTER — Ambulatory Visit (INDEPENDENT_AMBULATORY_CARE_PROVIDER_SITE_OTHER): Payer: BC Managed Care – PPO | Admitting: Urology

## 2023-08-12 VITALS — BP 121/72 | HR 82 | Ht 67.0 in | Wt 166.0 lb

## 2023-08-12 DIAGNOSIS — N3281 Overactive bladder: Secondary | ICD-10-CM

## 2023-08-12 DIAGNOSIS — N3941 Urge incontinence: Secondary | ICD-10-CM | POA: Diagnosis not present

## 2023-08-12 LAB — URINALYSIS, COMPLETE
Bilirubin, UA: NEGATIVE
Glucose, UA: NEGATIVE
Ketones, UA: NEGATIVE
Leukocytes,UA: NEGATIVE
Nitrite, UA: NEGATIVE
Protein,UA: NEGATIVE
Specific Gravity, UA: 1.01 (ref 1.005–1.030)
Urobilinogen, Ur: 0.2 mg/dL (ref 0.2–1.0)
pH, UA: 7 (ref 5.0–7.5)

## 2023-08-12 LAB — MICROSCOPIC EXAMINATION

## 2023-08-12 LAB — BLADDER SCAN AMB NON-IMAGING: Scan Result: 7

## 2023-08-12 IMAGING — DX DG ABD PORTABLE 2V
2 series · 2 of 2 positions shown · non-contrast
Comparison: CT abdomen pelvis 05/18/2022.

CLINICAL DATA: Abdominal pain and constipation status post
colonoscopy.

EXAM:
PORTABLE ABDOMEN - 2 VIEW

[abdomen erect]
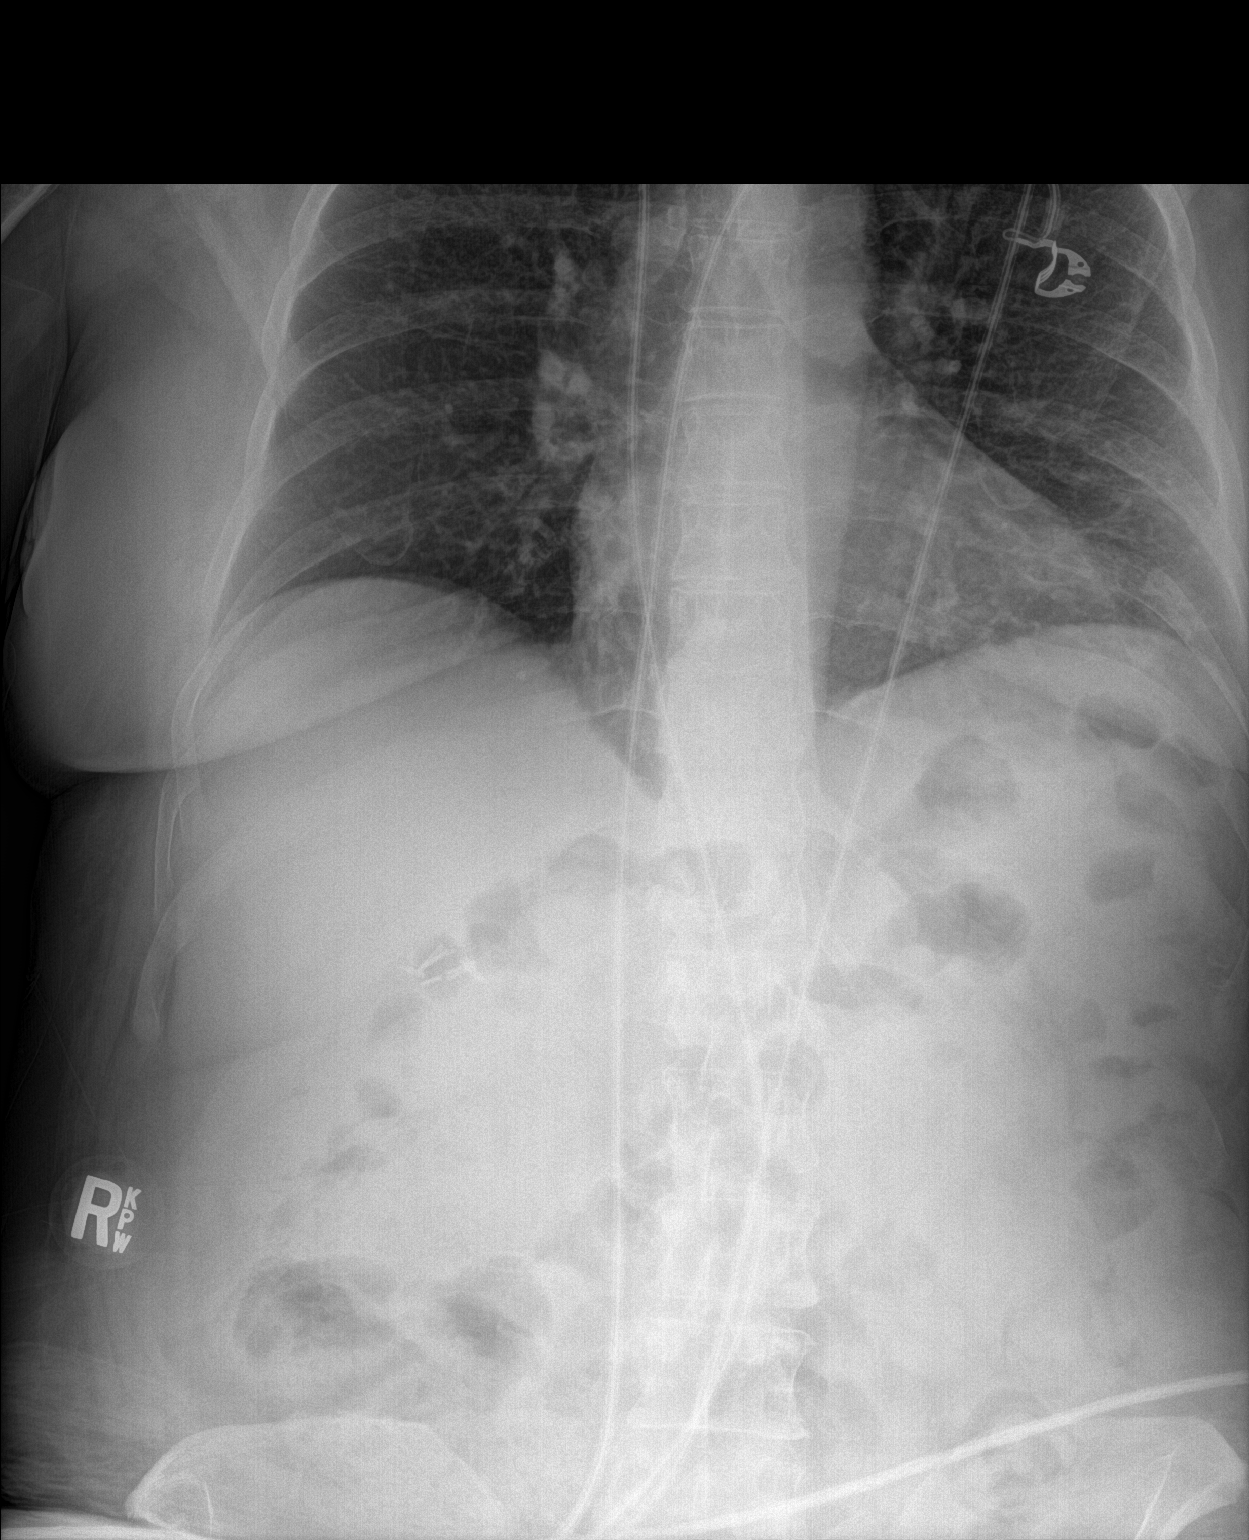

[abdomen supine]
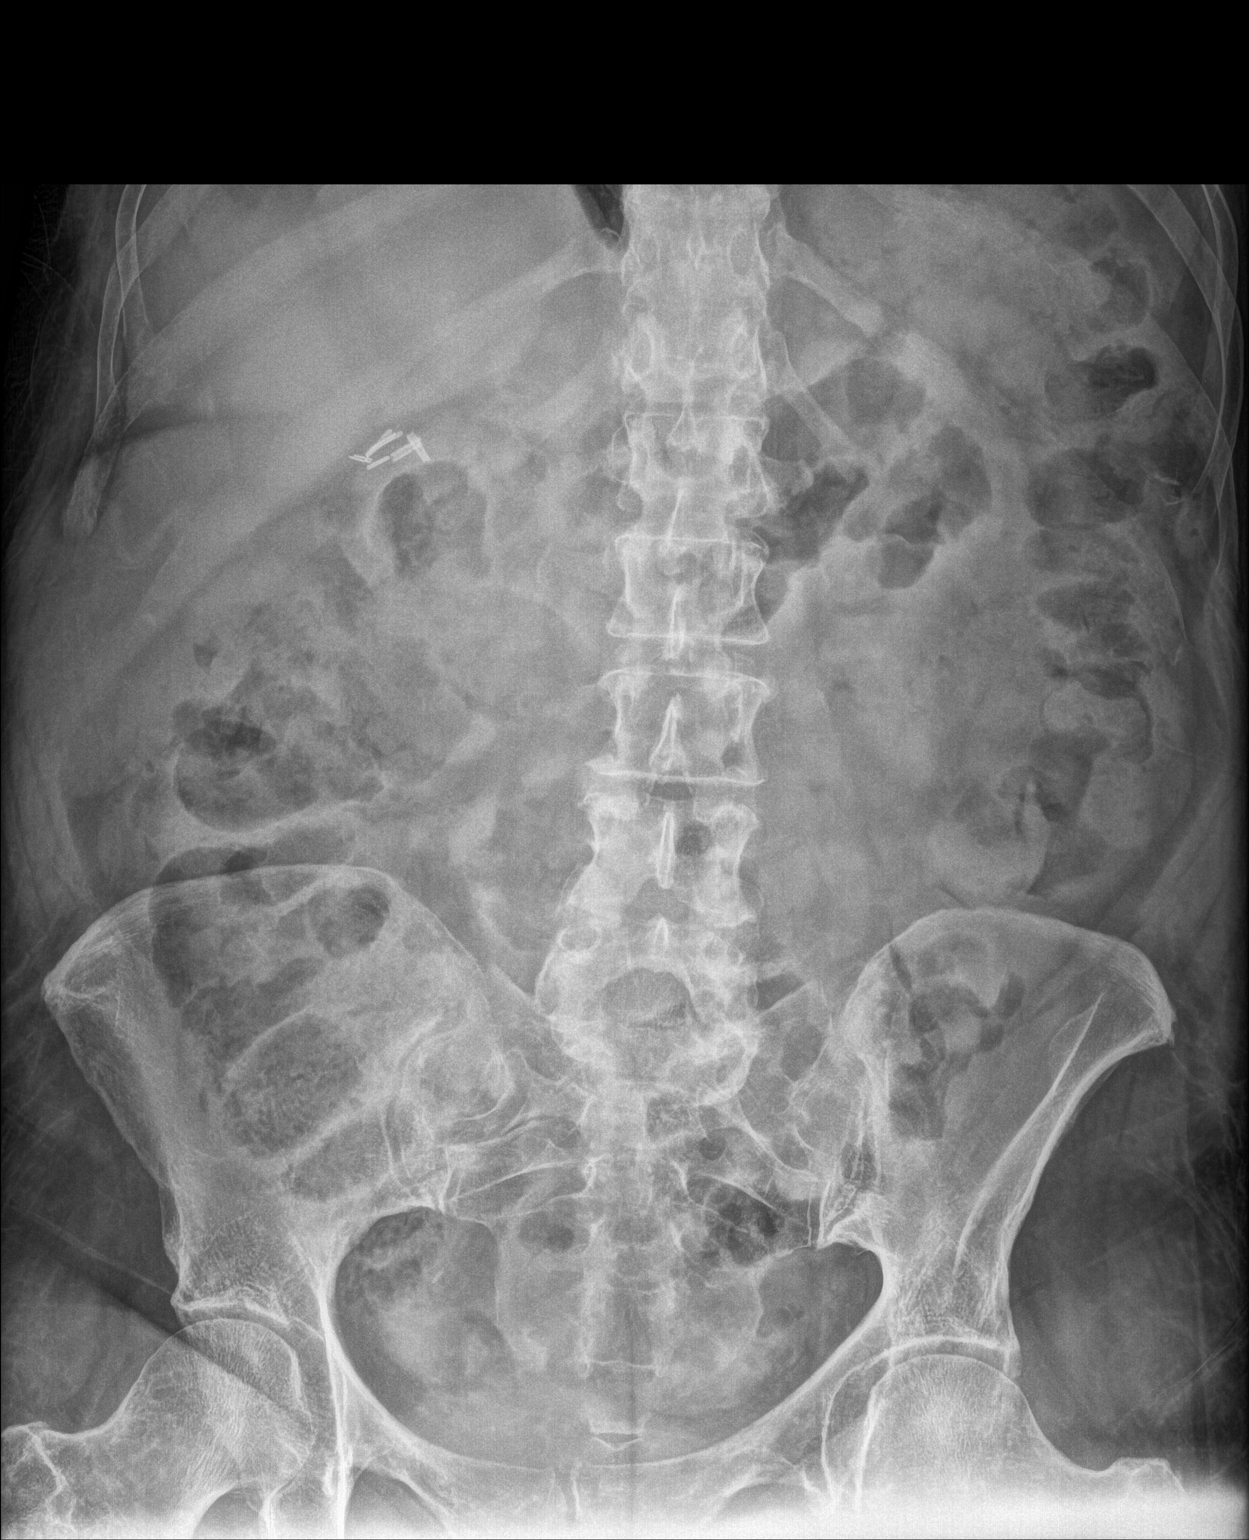

[2 of 2 positions shown; findings below may reference images not displayed]

FINDINGS: Stool is scattered throughout the colon with more than was seen in
the colon on yesterday's scout image. Visualized chest is
unremarkable.
IMPRESSION: Moderate constipation, with likely some improvement from yesterday's
exam.

## 2023-08-12 IMAGING — US US EXTREM LOW VENOUS
1 series · 14 of 24 positions shown · non-contrast
Comparison: 03/31/2022

CLINICAL DATA: History of right gastrocnemius DVT

EXAM:
BILATERAL LOWER EXTREMITY VENOUS DOPPLER ULTRASOUND
TECHNIQUE: Gray-scale sonography with compression, as well as color and duplex
ultrasound, were performed to evaluate the deep venous system(s)
from the level of the common femoral vein through the popliteal and
proximal calf veins.

[Series 1: us venous img lower bilat (dvt) · portal-venous · 14 of 66 slices shown]
[im 1/66]
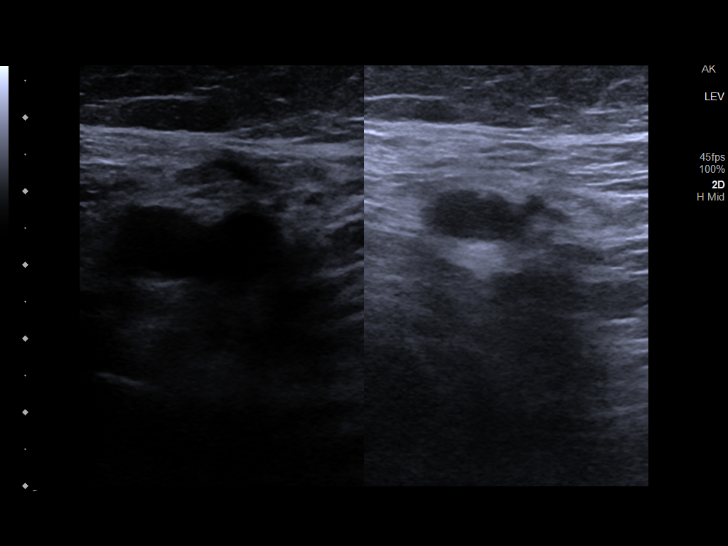
[im 6/66]
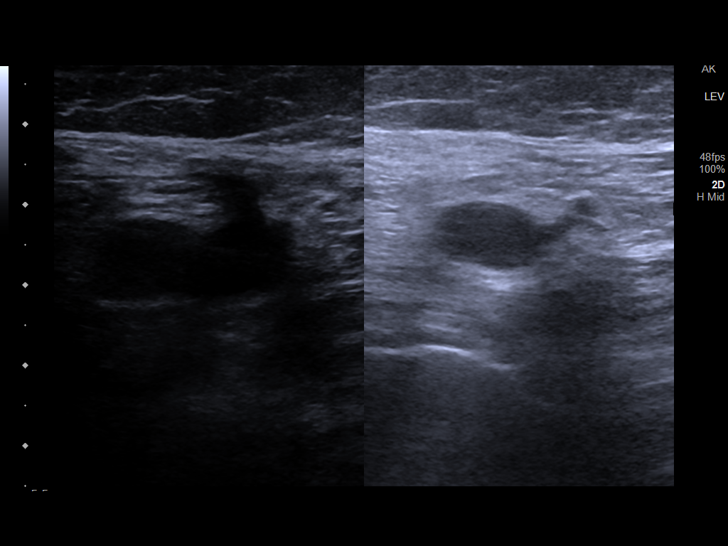
[im 12/66]
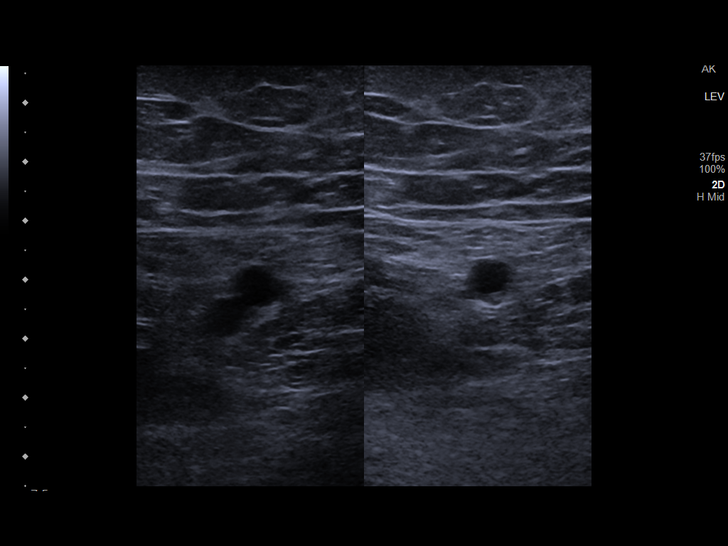
[im 17/66]
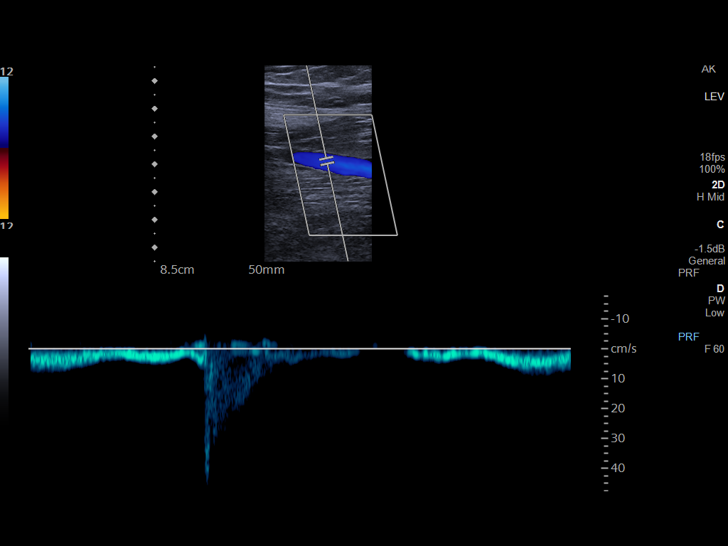
[im 20/66]
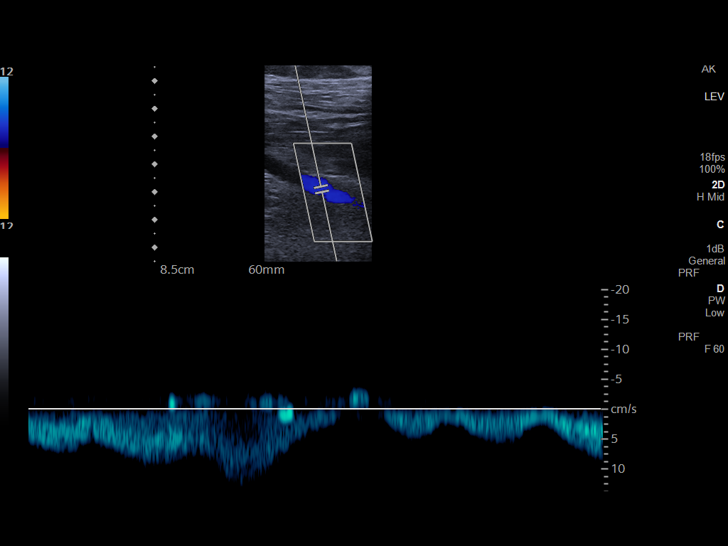
[im 26/66]
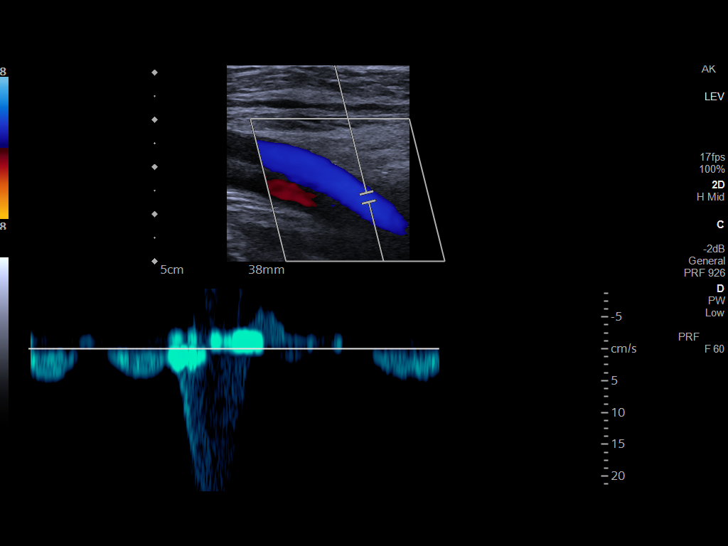
[im 32/66]
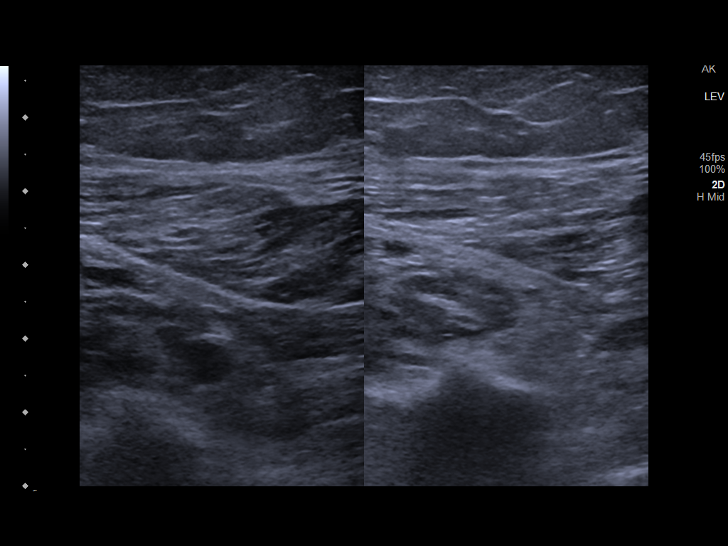
[im 34/66]
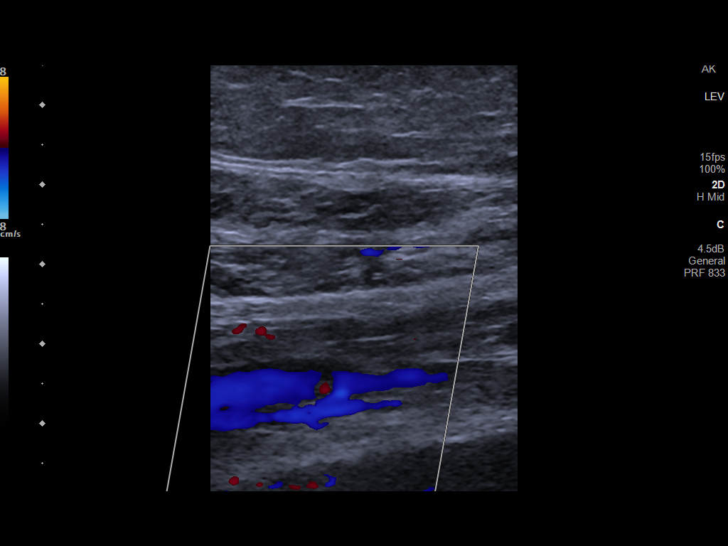
[im 40/66]
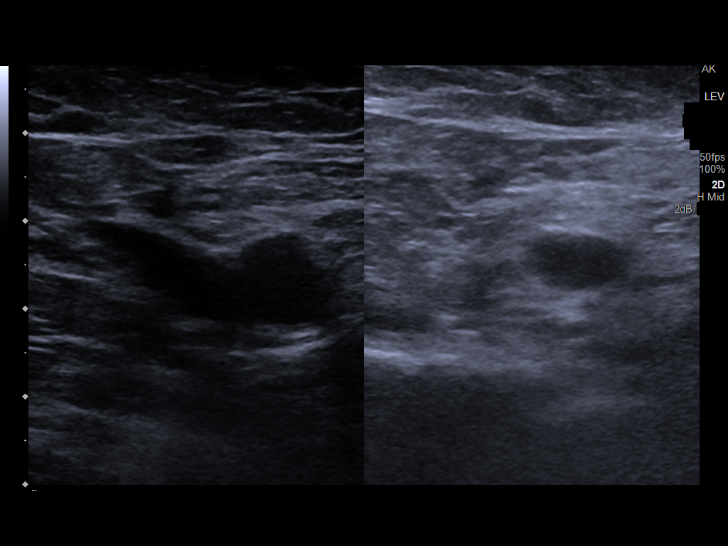
[im 46/66]
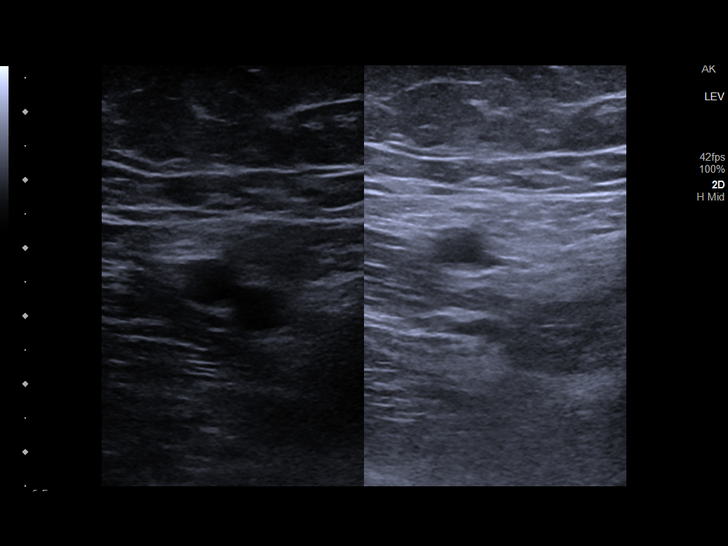
[im 51/66]
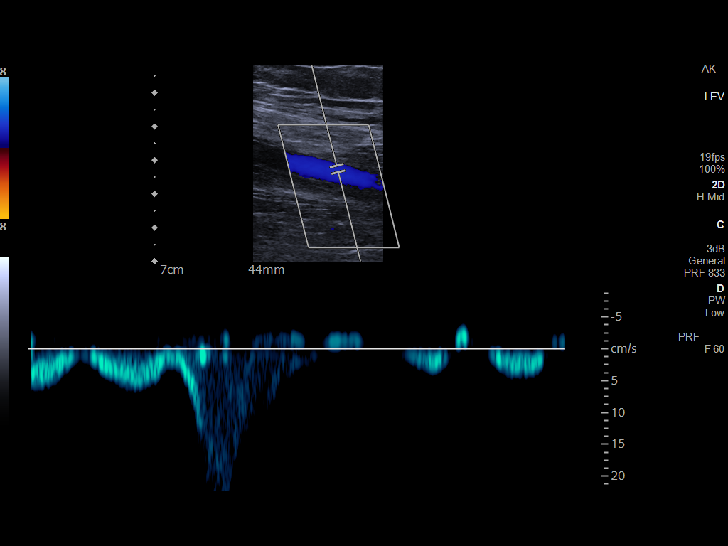
[im 54/66]
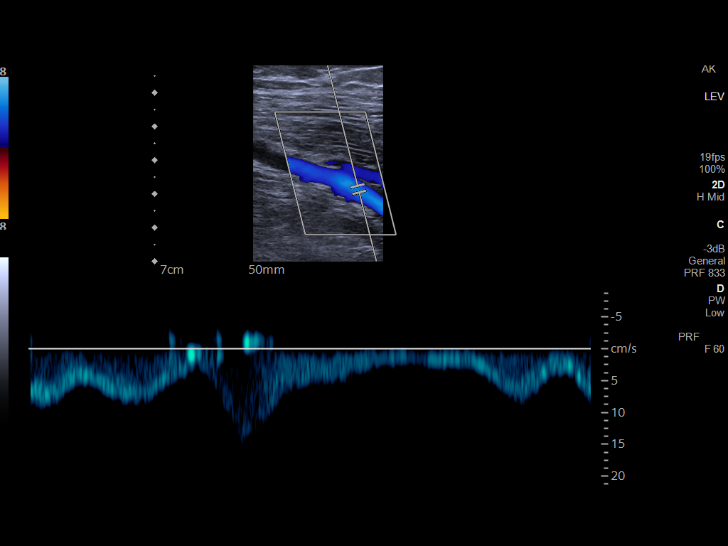
[im 60/66]
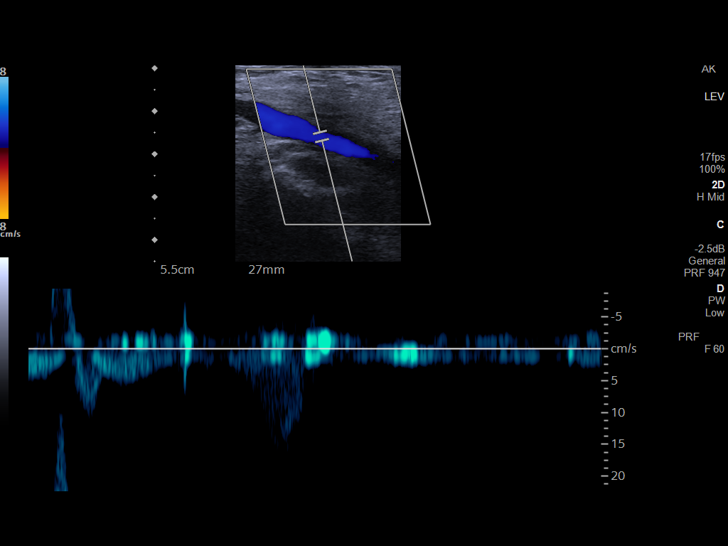
[im 66/66]
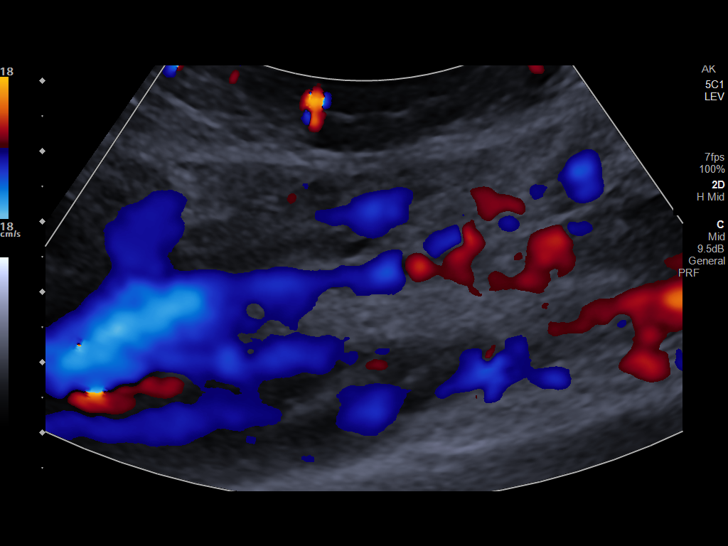

[14 of 24 positions shown; findings below may reference images not displayed]

FINDINGS: VENOUS

Normal compressibility of the common femoral, superficial femoral,
and popliteal veins, as well as the visualized calf veins including
right gastrocnemius. Visualized portions of profunda femoral vein
and great saphenous vein unremarkable. No filling defects to suggest
DVT on grayscale or color Doppler imaging. Doppler waveforms show
normal direction of venous flow, normal respiratory plasticity and
response to augmentation.

OTHER

None.

Limitations: none
IMPRESSION: 1. No evidence of lower extremity DVT. Right gastrocnemius vein is
patent.

## 2023-08-12 MED ORDER — TROSPIUM CHLORIDE 20 MG PO TABS: 20.0000 mg | ORAL_TABLET | Freq: Two times a day (BID) | ORAL | 3 refills | Status: DC

## 2023-08-12 NOTE — Progress Notes (Signed)
I, Maysun Anabel Bene, acting as a scribe for Riki Altes, MD., have documented all relevant documentation on the behalf of Riki Altes, MD, as directed by Riki Altes, MD while in the presence of Riki Altes, MD.  08/12/2023 1:06 PM   [name] 02-13-1961 [id number]  Referring provider: Pennie Banter, MD [address] [place],  [place] [address]  Chief Complaint  Patient presents with   Follow-up   Urologic history:  1.  Microscopic hematuria AUA hematuria risk stratification: High Prior history pelvic radiation with slight increased risk of urothelial carcinoma bladder   2.  Lower urinary tract symptoms Oxybutynin IR    HPI: Elizabeth Martin is a 62 y.o. female presents for evaluation of urinary incontinence.   Previously seen for microhematuria.  Several month history of urinary frequency, urgency with small volume urge incontinence during the day.  She has nocturia x2-3 with occasional urge incontinence at night.  Mild stress incontinence and her urge symptoms are much more bothersome.  She is doing kegel exercises.  No dysuria or gross hematuria. Denies flank, abdominal, or pelvic pain.   PMH: Past Medical History:  Diagnosis Date   Asthma    Autoimmune disease (HCC)    Cancer (HCC)    Endometrial lymph node removal (30)   Corneal abrasion, right 05/24/2022   GERD (gastroesophageal reflux disease)    H/O blood clots    upper rt groin and behind both knees   History of hiatal hernia    Hypertension    Lymphedema    right leg   Morphea scleroderma    PONV (postoperative nausea and vomiting)    states gets violently ill    Surgical History: Past Surgical History:  Procedure Laterality Date   BASAL CELL CARCINOMA EXCISION Left    CHOLECYSTECTOMY  2008   FLEXIBLE SIGMOIDOSCOPY N/A 05/19/2022   Procedure: FLEXIBLE SIGMOIDOSCOPY;  Surgeon: Toledo, Boykin Nearing, MD;  Location: [facility];  Service: Gastroenterology;  Laterality:  N/A;   HERNIA REPAIR  2004   ILEOSTOMY CLOSURE N/A 08/24/2022   Procedure: ILEOSTOMY TAKEDOWN, open loop with Lynden Oxford, PA-C to assist;  Surgeon: Henrene Dodge, MD;  Location: [facility];  Service: General;  Laterality: N/A;   PLANTAR FASCIECTOMY     ROBOTIC ASSISTED TOTAL HYSTERECTOMY WITH BILATERAL SALPINGO OOPHERECTOMY  02/14/2012   ROTATOR CUFF REPAIR Left 03/24/2022   TONSILLECTOMY     XI ROBOTIC ASSISTED LOWER ANTERIOR RESECTION N/A 05/24/2022   Procedure: XI ROBOTIC ASSISTED LOWER ANTERIOR RESECTION;  Surgeon: Henrene Dodge, MD;  Location: [facility];  Service: General;  Laterality: N/A;    Home Medications:  Allergies as of 08/12/2023       Reactions   Carboplatin Anaphylaxis   ANAPHYLACTIC REACTION ANAPHYLACTIC REACTION   Hydromorphone Anaphylaxis   Stopped breathing Respiratory arrest - due to dosage administered per the patient.   Latex Hives, Itching, Rash   Nitrofurantoin Nausea Only, Nausea And Vomiting   Silicone Itching, Rash   Wheat Other (See Comments)   Other reaction(s): Other (See Comments) Body aches and swelling Body aches and swelling   Fentanyl Hives, Itching, Rash        Medication List        Accurate as of August 12, 2023  1:06 PM. If you have any questions, ask your nurse or doctor.          STOP taking these medications    chlorthalidone 50 MG tablet Commonly known as: HYGROTON  pregabalin 100 MG capsule Commonly known as: LYRICA   prochlorperazine 5 MG tablet Commonly known as: COMPAZINE       TAKE these medications    albuterol 108 (90 Base) MCG/ACT inhaler Commonly known as: VENTOLIN HFA Inhale 2 puffs into the lungs every 6 (six) hours as needed.   busPIRone 5 MG tablet Commonly known as: BUSPAR Take by mouth.   cetirizine 10 MG tablet Commonly known as: ZYRTEC Take 10 mg by mouth daily as needed.   desonide 0.05 % cream Commonly known as: DESOWEN Apply 1 Application topically 2 (two) times daily.    DULoxetine 30 MG capsule Commonly known as: CYMBALTA Take 90 mg by mouth daily.   dutasteride 0.5 MG capsule Commonly known as: AVODART Take 0.5 mg by mouth daily.   folic acid 1 MG tablet Commonly known as: FOLVITE Take 1 mg by mouth daily.   hydroxychloroquine 200 MG tablet Commonly known as: PLAQUENIL Take 200 mg by mouth 2 (two) times daily.   lisinopril 40 MG tablet Commonly known as: ZESTRIL Take 40 mg by mouth daily.   methotrexate 2.5 MG tablet Commonly known as: RHEUMATREX Take 5 tablets by mouth every 7 (seven) days.   multivitamin capsule Take 1 capsule by mouth daily.   omeprazole 40 MG capsule Commonly known as: PRILOSEC Take 40 mg by mouth daily.   ondansetron 8 MG disintegrating tablet Commonly known as: ZOFRAN-ODT Take 8 mg by mouth every 8 (eight) hours as needed.   promethazine 12.5 MG tablet Commonly known as: PHENERGAN Take 1 tablet (12.5 mg total) by mouth every 6 (six) hours as needed for nausea or vomiting.   triamcinolone 0.025 % cream Commonly known as: KENALOG Apply 1 Application topically 2 (two) times daily.   trospium 20 MG tablet Commonly known as: SANCTURA Take 1 tablet (20 mg total) by mouth 2 (two) times daily.        Allergies:  Allergies  Allergen Reactions   Carboplatin Anaphylaxis    ANAPHYLACTIC REACTION ANAPHYLACTIC REACTION    Hydromorphone Anaphylaxis    Stopped breathing  Respiratory arrest - due to dosage administered per the patient.   Latex Hives, Itching and Rash   Nitrofurantoin Nausea Only and Nausea And Vomiting   Silicone Itching and Rash   Wheat Other (See Comments)    Other reaction(s): Other (See Comments) Body aches and swelling Body aches and swelling    Fentanyl Hives, Itching and Rash     Social History:  reports that she has never smoked. She has never used smokeless tobacco. She reports current alcohol use. She reports that she does not use drugs.   Physical Exam: BP 121/72    Pulse 82   Ht 5\' 7"  (1.702 m)   Wt 166 lb (75.3 kg)   BMI 26.00 kg/m   Constitutional:  Alert and oriented, No acute distress. HEENT: [place] AT, moist mucus membranes.  Trachea midline, no masses. Cardiovascular: No clubbing, cyanosis, or edema. Respiratory: Normal respiratory effort, no increased work of breathing. GI: Abdomen is soft, nontender, nondistended, no abdominal masses Skin: No rashes, bruises or suspicious lesions. Neurologic: Grossly intact, no focal deficits, moving all 4 extremities. Psychiatric: Normal mood and affect.   Urinalysis Dipstick 1+ blood/microscopy negative    Assessment & Plan:    1. Overactive bladder with urge incontinence Management options were discussed, including behavioral therapy, pelvic floor strengthening, and medical management.  Discussed dedicated pelvic floor program will have a higher success rate than at-home exercises and she  was interested in a physical therapy referral.  She requested a medication she could potentially take as needed when she was going to be out and Rx trospium 20 mg Q12 hours as needed- was sent to pharmacy.  [facility] [address] [place], [place] [address] [phone]

## 2023-08-12 NOTE — Progress Notes (Unsigned)
??? ?? ????? ?? ?? ? ?? ??  ??? ???   6???? ?? ????? ??? ?????. 6??? ???? ??? ?? ? ?? ?? ??? ??????

## 2023-08-13 IMAGING — CR DG ABDOMEN 2V
1 series · 2 of 2 positions shown · non-contrast
Comparison: Yesterday

CLINICAL DATA: Re-evaluate bowel obstruction. Colonoscopy yesterday

EXAM:
ABDOMEN - 2 VIEW

[Series 1: dg abd 2 views · 0.14mm/px · 2 of 2 slices shown]
[im 1/2]
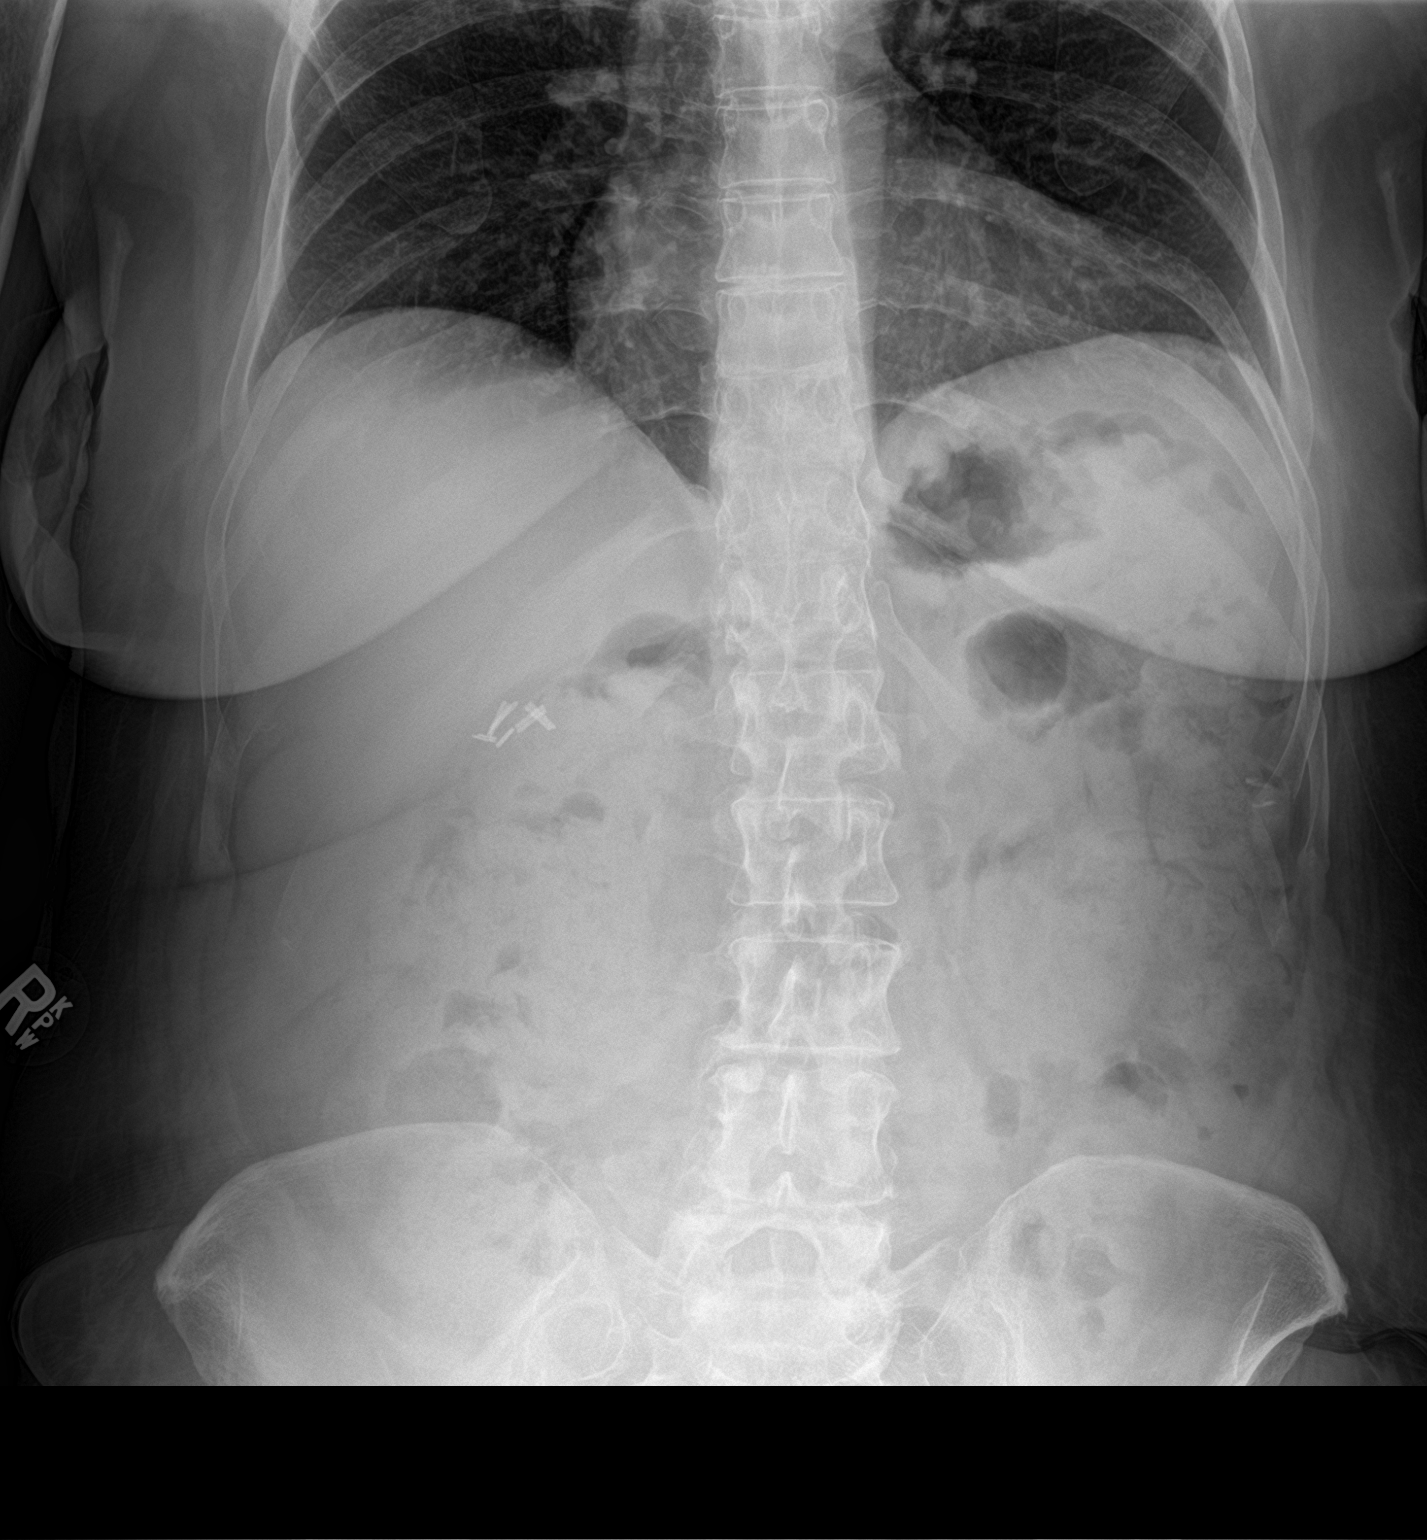
[im 2/2]
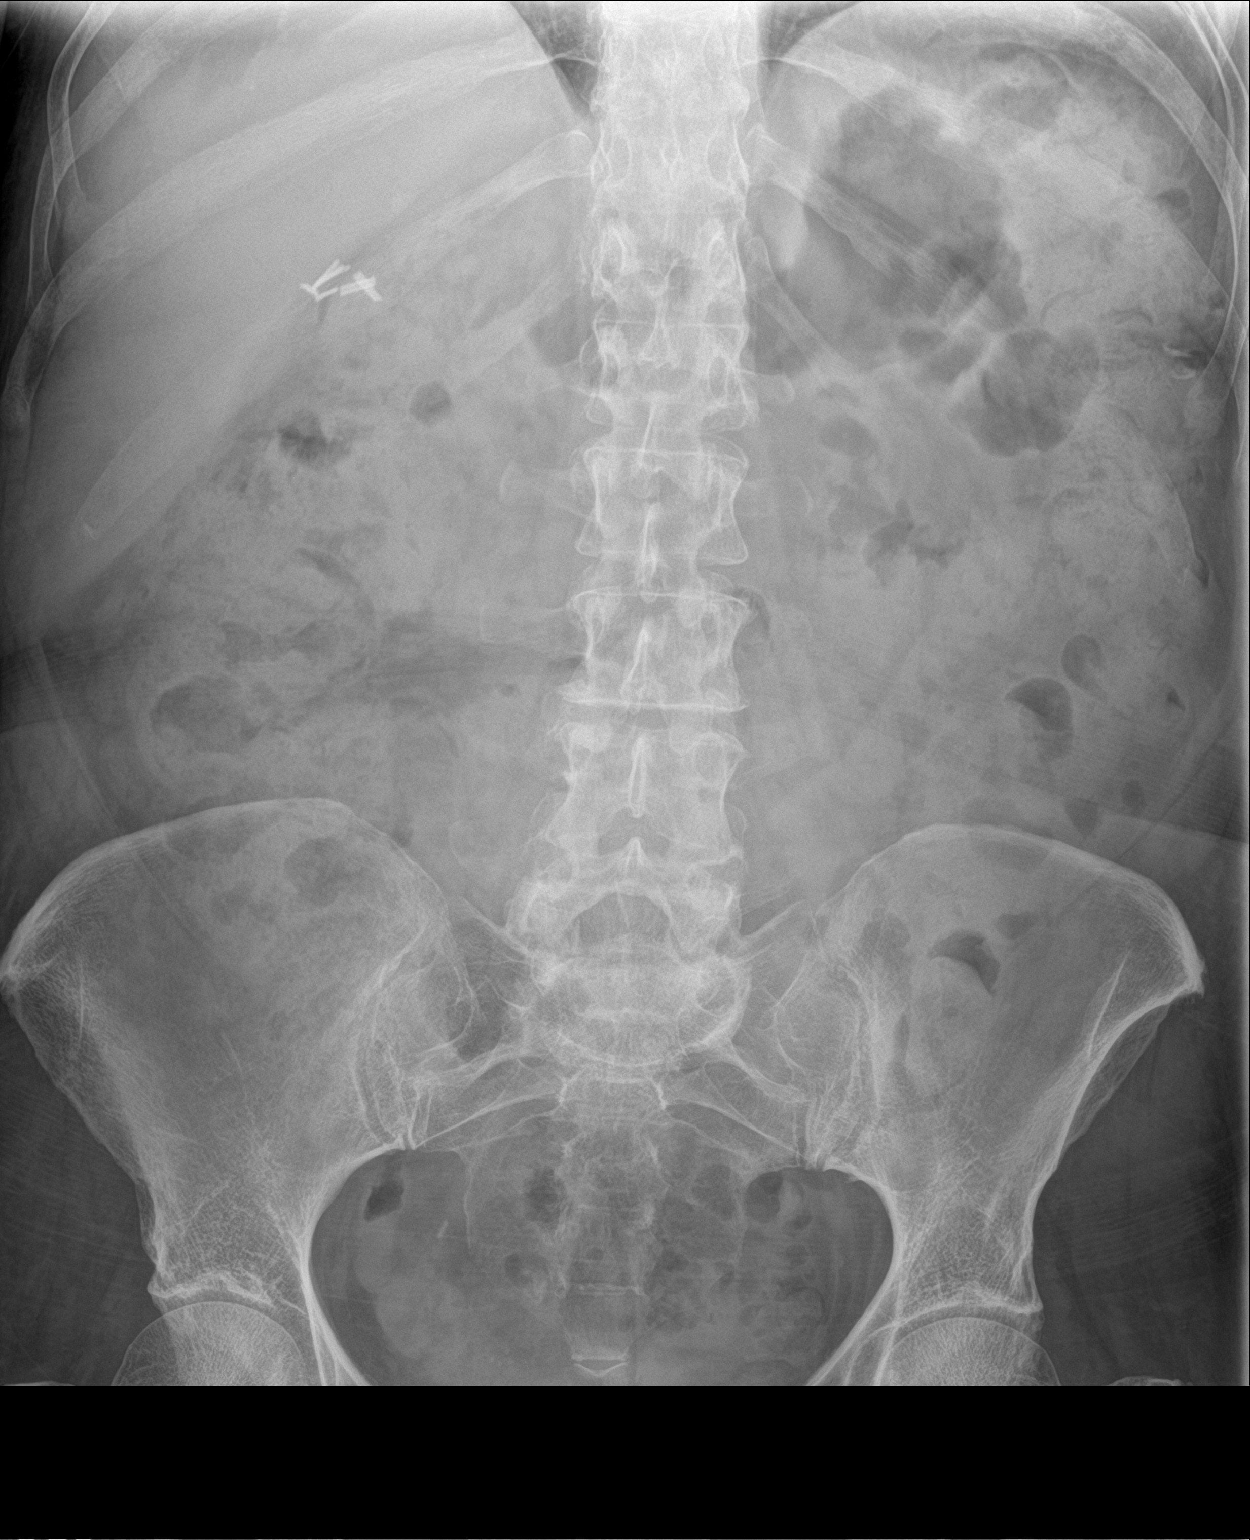

[2 of 2 positions shown; findings below may reference images not displayed]

FINDINGS: Upright and supine views. The upright view demonstrates
cholecystectomy clips. No significant air fluid levels. No free
intraperitoneal air.

The supine view demonstrates a large amount of colonic stool. No
gaseous distention of bowel loops. No abnormal abdominal
calcifications. No appendicolith.
IMPRESSION: 1. No acute findings.
2. Possible constipation.

## 2023-08-14 ENCOUNTER — Encounter: Payer: Self-pay | Admitting: Urology

## 2023-10-10 ENCOUNTER — Encounter: Payer: Self-pay | Admitting: Occupational Therapy

## 2023-10-10 ENCOUNTER — Ambulatory Visit: Payer: BC Managed Care – PPO | Attending: Family Medicine | Admitting: Occupational Therapy

## 2023-10-10 DIAGNOSIS — I890 Lymphedema, not elsewhere classified: Secondary | ICD-10-CM | POA: Diagnosis present

## 2023-10-10 NOTE — Therapy (Unsigned)
OUTPATIENT PHYSICAL THERAPY LOWER EXTREMITY LYMPHEDEMA EVALUATION  Patient Name: Elizabeth Martin MRN: [id number] DOB:23-Nov-1961, 62 y.o., female Today's Date: 10/10/2023  END OF SESSION:   Past Medical History:  Diagnosis Date   Asthma    Autoimmune disease (HCC)    Cancer (HCC)    Endometrial lymph node removal (30)   Corneal abrasion, right 05/24/2022   GERD (gastroesophageal reflux disease)    H/O blood clots    upper rt groin and behind both knees   History of hiatal hernia    Hypertension    Lymphedema    right leg   Morphea scleroderma    PONV (postoperative nausea and vomiting)    states gets violently ill   Past Surgical History:  Procedure Laterality Date   BASAL CELL CARCINOMA EXCISION Left    CHOLECYSTECTOMY  2008   FLEXIBLE SIGMOIDOSCOPY N/A 05/19/2022   Procedure: FLEXIBLE SIGMOIDOSCOPY;  Surgeon: Toledo, Boykin Nearing, MD;  Location: [facility];  Service: Gastroenterology;  Laterality: N/A;   HERNIA REPAIR  2004   ILEOSTOMY CLOSURE N/A 08/24/2022   Procedure: ILEOSTOMY TAKEDOWN, open loop with Lynden Oxford, PA-C to assist;  Surgeon: Henrene Dodge, MD;  Location: [facility];  Service: General;  Laterality: N/A;   PLANTAR FASCIECTOMY     ROBOTIC ASSISTED TOTAL HYSTERECTOMY WITH BILATERAL SALPINGO OOPHERECTOMY  02/14/2012   ROTATOR CUFF REPAIR Left 03/24/2022   TONSILLECTOMY     XI ROBOTIC ASSISTED LOWER ANTERIOR RESECTION N/A 05/24/2022   Procedure: XI ROBOTIC ASSISTED LOWER ANTERIOR RESECTION;  Surgeon: Henrene Dodge, MD;  Location: [facility];  Service: General;  Laterality: N/A;   Patient Active Problem List   Diagnosis Date Noted   Dyspnea 03/07/2023   [facility] spotted fever 03/07/2023   Strain of gastrocnemius tendon 03/07/2023   Lower abdominal pain 03/07/2023   Pain in pelvis 03/07/2023   Small bowel obstruction (HCC) 02/11/2023   Raynaud disease 02/11/2023   Polyarthralgia 02/11/2023   Edema 01/03/2023   Diverticular disease 12/10/2022   Hot  flashes 12/10/2022   Pain of right lower extremity 12/10/2022   Paresthesia of lower extremity 12/10/2022   Primary insomnia 12/10/2022   Thyroid nodule 12/10/2022   Multiple joint pain 12/10/2022   S/P closure of ileostomy 08/24/2022   Ileostomy in place [facility])    Morphea 07/13/2022   Colonic stricture (HCC) 05/19/2022   Large bowel obstruction (HCC) 05/19/2022   Hypokalemia 05/18/2022   Abdominal pain 05/18/2022   Osteoarthritis of left knee 02/24/2022   Mixed connective tissue disease (HCC) 02/17/2022   Adhesive capsulitis of left shoulder 01/27/2022   Glenohumeral arthritis, left 01/27/2022   Microscopic hematuria 01/12/2022   Otorrhagia of right ear 12/10/2021   Elevated testosterone level in female 11/04/2021   Rash 10/13/2021   Hashimoto thyroiditis, 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   Hashimoto's disease 03/23/2021   Exposure to severe acute respiratory syndrome coronavirus 2 (SARS-CoV-2) 12/19/2020   Migraine without aura and without status migrainosus, not intractable 08/19/2020   Myofascial pain 07/30/2020   Neck pain 07/30/2020   Upper back pain 07/30/2020   Hair loss 07/14/2020   Edema of lower extremity 07/11/2020   Gastroesophageal reflux disease 02/15/2020   Diarrhea 02/15/2020   Sleep disorder 01/16/2020   Hyperlipidemia 07/11/2019   Right shoulder pain 04/04/2019   Pre-operative clearance 12/13/2018   Endometrial cancer (HCC) 04/05/2018   Patellofemoral pain syndrome of right knee 03/20/2018   Gastroenteritis 12/07/2017   Fatigue 11/16/2017   Morbid obesity (HCC) 08/11/2017   Stucco keratoses 08/11/2017  Essential hypertension 08/09/2017   History of endometrial cancer 08/09/2017   Lymphedema 08/09/2017   Menopause 08/09/2017   Malignant neoplastic disease (HCC) 04/07/2017   Benign hypertension 06/02/2016   Asthma 06/04/1969    PCP: Pennie Banter, MD  REFERRING PROVIDER: Pennie Banter, MD  REFERRING DIAG: I89.0 Cancer-related (endometrial  2013)  THERAPY DIAG:  Lymphedema, not elsewhere classified  Rationale for Evaluation and Treatment: Rehabilitation  ONSET DATE: ***  SUBJECTIVE:                                                                                                                                                                                           SUBJECTIVE STATEMENT:  PERTINENT HISTORY:   PAIN:  Are you having pain? {OPRCPAIN:27236}  PRECAUTIONS: {Therapy precautions:24002}  RED FLAGS: {PT Red Flags:29287}   WEIGHT BEARING RESTRICTIONS: Yes 5# lifting restriction  FALLS:  Has patient fallen in last 6 months? No  LIVING ENVIRONMENT: Lives with: lives with their spouse Lives in: House/apartment Stairs: No;  Has following equipment at home: Single point cane, Environmental consultant - 2 wheeled, Environmental consultant - 4 wheeled, and Wheelchair (manual)  OCCUPATION: retired  Human resources officer: gardening, hiking, biking, dancing  HAND DOMINANCE: right   PRIOR LEVEL OF FUNCTION: Independent with basic ADLs, Independent with household mobility without device, Independent with community mobility without device, Requires assistive device for independence, Needs assistance with homemaking, Needs assistance with transfers, and Leisure: decreased  social participation for leisure pursuits due to impaired mobility and pain  PATIENT GOALS:  Be able to move freely without pain Reduce limb volume to be able to lift limb for basic daily acties and functional ambulation Reduce limb volume to increase ability to perform lower body dression and bathing and grooming Reduce limb to increase body image  OBJECTIVE: Note: Objective measures were completed at Evaluation unless otherwise noted.  COGNITION:  Overall cognitive status: Within functional limits for tasks assessed   PALPATION: ***  OBSERVATIONS / OTHER ASSESSMENTS: ***  SENSATION: {sensation:27233}  POSTURE: ***  LE ROM:   {AROM/PROM:27142}  Right 10/10/2023 LEFT 10/10/2023   Hip flexion    Hip extension    Hip abduction    Hip adduction    Hip internal rotation    Hip external rotation    Knee flexion    Knee extension    Ankle dorsiflexion    Ankle plantarflexion    Ankle inversion    Ankle eversion     (Blank rows = not tested)  LE MMT:   MMT Right 10/10/2023 Left 10/10/2023  Hip flexion    Hip extension    Hip abduction    Hip adduction  Hip internal rotation    Hip external rotation    Knee flexion    Knee extension    Ankle dorsiflexion    Ankle plantarflexion    Ankle inversion    Ankle eversion     (Blank rows = not tested)  LYMPHEDEMA ASSESSMENTS:   SURGERY TYPE/DATE: ***  NUMBER OF LYMPH NODES REMOVED: ***  CHEMOTHERAPY: ***  RADIATION:***  HORMONE TREATMENT: ***  INFECTIONS: ***   LYMPHEDEMA ASSESSMENTS:   LANDMARK RIGHT  10/10/2023  10 cm proximal to olecranon process   Olecranon process   10 cm proximal to ulnar styloid process   Just proximal to ulnar styloid process   Across hand at thumb web space   At base of 2nd digit   (Blank rows = not tested)  LANDMARK LEFT  10/10/2023  10 cm proximal to olecranon process   Olecranon process   10 cm proximal to ulnar styloid process   Just proximal to ulnar styloid process   Across hand at thumb web space   At base of 2nd digit   (Blank rows = not tested)   LE LANDMARK RIGHT 10/10/2023  At groin   30 cm proximal to suprapatella   20 cm proximal to suprapatella   10 cm proximal to suprapatella   At midpatella / popliteal crease   30 cm proximal to floor at lateral plantar foot   20 cm proximal to floor at lateral plantar foot   10 cm proximal to floor at lateral plantar foot   Circumference of ankle/heel   5 cm proximal to 1st MTP joint   Across MTP joint   Around proximal great toe   (Blank rows = not tested)  LE LANDMARK LEFT 10/10/2023  At groin   30 cm proximal to suprapatella   20 cm proximal to suprapatella   10 cm proximal to suprapatella    At midpatella / popliteal crease   30 cm proximal to floor at lateral plantar foot   20 cm proximal to floor at lateral plantar foot   10 cm proximal to floor at lateral plantar foot   Circumference of ankle/heel   5 cm proximal to 1st MTP joint   Across MTP joint   Around proximal great toe   (Blank rows = not tested)  FUNCTIONAL TESTS:  {Functional tests:24029}  GAIT: Distance walked: *** Assistive device utilized: {Assistive devices:23999} Level of assistance: {Levels of assistance:24026} Comments: ***  LYMPHEDEMA LIFE IMPACT SCALE (LLIS):  I. Physical Concerns The amount of pain associated with my lymphedema is: {LLISSELECTION:27229} The amount of limb heaviness associated with my lymphedema is: {LLISSELECTION:27229} The amount of skin tightness associated with my lymphedema is: {LLISSELECTION:27229} In comparison to my unaffected limb, the size of my swollen limb seems: {LLISSELECTION:27229} In comparison to my unaffected limb, the texture of my swollen limb feels: {LLISSELECTION:27229} Lymphedema affects movement of my swollen limb: {LLISSELECTION:27229} The strength in my swollen limb compared with the unaffected limb is: {LLISSELECTION:27229} How often have you become ill wit an infection in your swollen limb requiring oral antibiotics or hospitalization in the past 2 YEARS? {LLISSELECTION:27229}  II. Psychosocial Concerns 9.   Lymphedema affects my body image (ie. "How I think I look"): {LLISSELECTION:27229} 10. Lymphedema affects my socializing with others: {LLISSELECTION:27229} 11. Lymphedema affects my intimate relations: {LLISSELECTION:27229} 12. Lymphedema "gets me down" (ie. I have feelings of depression, frustration, or anger due to the lymphedema): {LLISSELECTION:27229}  III. Functional Concerns 13. Lymphedema affects my ability to perform duties at home: {  LLISSELECTION:27229} 14. Lymphedema affects my ability to perform dueites at work (if applicable):  {LLISSELECTION:27229} 15. Lymphedema affects my performance of preferred recreational activities: {LLISSELECTION:27229} 16. Lymphedema affects the proper fit of clothing/shoes: {LLISSELECTION:27229} 17. Lymphedema affects my sleep: {LLISSELECTION:27229} 18. I must rely on others for help due to my lymphedema: {LLISSELECTION:27229}  TODAY'S TREATMENT:                                                                                                                                         DATE: ***  PATIENT EDUCATION:  Education details: *** Person educated: {Person educated:25204} Education method: {Education Method:25205} Education comprehension: {Education Comprehension:25206}  HOME EXERCISE PROGRAM: ***  ASSESSMENT:  CLINICAL IMPRESSION: Patient is a *** y.o. *** who was seen today for physical therapy evaluation and treatment for ***.   OBJECTIVE IMPAIRMENTS: {opptimpairments:25111}.   ACTIVITY LIMITATIONS: {activitylimitations:27494}  PARTICIPATION LIMITATIONS: {participationrestrictions:25113}  PERSONAL FACTORS: {Personal factors:25162} are also affecting patient's functional outcome.   REHAB POTENTIAL: {rehabpotential:25112}  CLINICAL DECISION MAKING: {clinical decision making:25114}  EVALUATION COMPLEXITY: {Evaluation complexity:25115}   GOALS: Goals reviewed with patient? {yes/no:20286}  SHORT TERM GOALS: Target date: ***  *** Baseline: Goal status: INITIAL  2.  *** Baseline:  Goal status: INITIAL  3.  *** Baseline:  Goal status: INITIAL  4.  *** Baseline:  Goal status: INITIAL  5.  *** Baseline:  Goal status: INITIAL  6.  *** Baseline:  Goal status: INITIAL  LONG TERM GOALS: Target date: ***  *** Baseline:  Goal status: INITIAL  2.  *** Baseline:  Goal status: INITIAL  3.  *** Baseline:  Goal status: INITIAL  4.  *** Baseline:  Goal status: INITIAL  5.  *** Baseline:  Goal status: INITIAL  6.  *** Baseline:  Goal status:  INITIAL   PLAN:  PT FREQUENCY: {rehab frequency:25116}  PT DURATION: {rehab duration:25117}  PLANNED INTERVENTIONS: {rehab planned interventions:25118::"97110-Therapeutic exercises","97530- Therapeutic [id number]- Neuromuscular re-education","97535- Self JXBJ","47829- Manual therapy"}  PLAN FOR NEXT SESSION: ***  Loel Dubonnet, MS, OTR/L, CLT-LANA 10/10/23 3:20 PM  10/10/2023, 3:20 PM

## 2023-10-12 ENCOUNTER — Ambulatory Visit: Payer: BC Managed Care – PPO | Admitting: Occupational Therapy

## 2023-10-12 ENCOUNTER — Encounter: Payer: Self-pay | Admitting: Occupational Therapy

## 2023-10-12 DIAGNOSIS — I890 Lymphedema, not elsewhere classified: Secondary | ICD-10-CM | POA: Diagnosis not present

## 2023-10-13 NOTE — Therapy (Addendum)
OUTPATIENT OCCUPATIONAL THERAPY TREATMENT NOTE  LOWER EXTREMITY LYMPHEDEMA  Patient Name: Elizabeth Martin MRN: [id number] DOB:1961/10/27, 62 y.o., female Today's Date: 10/13/2023  END OF SESSION:  Lymphedema Episode 2   OT End of Session - 10/13/23 0906     Visit Number 2    Number of Visits 36    Date for OT Re-Evaluation 01/09/24    OT Start Time 0100    OT Stop Time 0215    OT Time Calculation (min) 75 min    Activity Tolerance Patient tolerated treatment well;No increased pain    Behavior During Therapy WFL for tasks assessed/performed             Past Medical History:  Diagnosis Date   Asthma    Autoimmune disease (HCC)    Cancer (HCC)    Endometrial lymph node removal (30)   Corneal abrasion, right 05/24/2022   GERD (gastroesophageal reflux disease)    H/O blood clots    upper rt groin and behind both knees   History of hiatal hernia    Hypertension    Lymphedema    right leg   Morphea scleroderma    PONV (postoperative nausea and vomiting)    states gets violently ill   Past Surgical History:  Procedure Laterality Date   BASAL CELL CARCINOMA EXCISION Left    CHOLECYSTECTOMY  2008   FLEXIBLE SIGMOIDOSCOPY N/A 05/19/2022   Procedure: FLEXIBLE SIGMOIDOSCOPY;  Surgeon: Toledo, Boykin Nearing, MD;  Location: [facility];  Service: Gastroenterology;  Laterality: N/A;   HERNIA REPAIR  2004   ILEOSTOMY CLOSURE N/A 08/24/2022   Procedure: ILEOSTOMY TAKEDOWN, open loop with Lynden Oxford, PA-C to assist;  Surgeon: Henrene Dodge, MD;  Location: [facility] ORS;  Service: General;  Laterality: N/A;   PLANTAR FASCIECTOMY     ROBOTIC ASSISTED TOTAL HYSTERECTOMY WITH BILATERAL SALPINGO OOPHERECTOMY  02/14/2012   ROTATOR CUFF REPAIR Left 03/24/2022   TONSILLECTOMY     XI ROBOTIC ASSISTED LOWER ANTERIOR RESECTION N/A 05/24/2022   Procedure: XI ROBOTIC ASSISTED LOWER ANTERIOR RESECTION;  Surgeon: Henrene Dodge, MD;  Location: [facility] ORS;  Service: General;  Laterality: N/A;    Patient Active Problem List   Diagnosis Date Noted   Dyspnea 03/07/2023   [facility] spotted fever 03/07/2023   Strain of gastrocnemius tendon 03/07/2023   Lower abdominal pain 03/07/2023   Pain in pelvis 03/07/2023   Small bowel obstruction (HCC) 02/11/2023   Raynaud disease 02/11/2023   Polyarthralgia 02/11/2023   Edema 01/03/2023   Diverticular disease 12/10/2022   Hot flashes 12/10/2022   Pain of right lower extremity 12/10/2022   Paresthesia of lower extremity 12/10/2022   Primary insomnia 12/10/2022   Thyroid nodule 12/10/2022   Multiple joint pain 12/10/2022   S/P closure of ileostomy 08/24/2022   Ileostomy in place [facility])    Morphea 07/13/2022   Colonic stricture (HCC) 05/19/2022   Large bowel obstruction (HCC) 05/19/2022   Hypokalemia 05/18/2022   Abdominal pain 05/18/2022   Osteoarthritis of left knee 02/24/2022   Mixed connective tissue disease (HCC) 02/17/2022   Adhesive capsulitis of left shoulder 01/27/2022   Glenohumeral arthritis, left 01/27/2022   Microscopic hematuria 01/12/2022   Otorrhagia of right ear 12/10/2021   Elevated testosterone level in female 11/04/2021   Rash 10/13/2021   Hashimoto thyroiditis, 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   Hashimoto's disease 03/23/2021   Exposure to severe acute respiratory syndrome coronavirus 2 (SARS-CoV-2) 12/19/2020   Migraine without aura and without status migrainosus, not intractable 08/19/2020   Myofascial  pain 07/30/2020   Neck pain 07/30/2020   Upper back pain 07/30/2020   Hair loss 07/14/2020   Edema of lower extremity 07/11/2020   Gastroesophageal reflux disease 02/15/2020   Diarrhea 02/15/2020   Sleep disorder 01/16/2020   Hyperlipidemia 07/11/2019   Right shoulder pain 04/04/2019   Pre-operative clearance 12/13/2018   Endometrial cancer (HCC) 04/05/2018   Patellofemoral pain syndrome of right knee 03/20/2018   Gastroenteritis 12/07/2017   Fatigue 11/16/2017   Morbid obesity (HCC) 08/11/2017    Stucco keratoses 08/11/2017   Essential hypertension 08/09/2017   History of endometrial cancer 08/09/2017   Lymphedema 08/09/2017   Menopause 08/09/2017   Malignant neoplastic disease (HCC) 04/07/2017   Benign hypertension 06/02/2016   Asthma 06/04/1969    PCP: Pennie Banter, MD  REFERRING PROVIDER: Pennie Banter, MD  REFERRING DIAG: I89.0   THERAPY DIAG:  Lymphedema, not elsewhere classified  Rationale for Evaluation and Treatment: Rehabilitation  ONSET DATE: 2013  (Cancer-related, endometrial 2013)  SUBJECTIVE:                                                                                                                                                                                           SUBJECTIVE STATEMENT:Elizabeth Martin presents to OT for lymphedema care to her RLE/RLQ. Pt presents with compression wraps in place, but falling off. Pt states she attempted to wrap herself based on what she remembered from last episode of OT for CDT, but states she struggled with remembering the steps and with keeping bandages in place. Pt states her husband did not assist this time, but she will ask him to attend an upcoming visit to get a refresher on applying gradient bandages.  PERTINENT HISTORY: Asthma, Autoimmune Disease, Endometrial Ca w/ LND ( 30 bilateral pelvic and periaortic LN), adjuvant chemotherapy and XRT,   H/O blood clots, RLE/RLQ lymphedema, Robotic assisted total hysterectomy w/ bilateral oophorectomy L Rotator Cuff repair, S/P ileostomy 2/2 colon stricture 05/2022  PAIN:  Are you having pain? Yes: NPRS scale: 5/10 Pain location: RLE, including hip Pain description: aching, heavy, full, tight Aggravating factors: standing, walking, extended dependent sitting Relieving factors: elevation, movement  PRECAUTIONS: Fall and Other: LYMPHEDEMA  WEIGHT BEARING RESTRICTIONS: Yes 5# lifting restriction- shoulder  FALLS:  Has patient fallen in last 6 months? No  LIVING  ENVIRONMENT: Lives with: lives with their spouse Lives in: House/apartment Stairs: No;  Has following equipment at home: Single point cane, Environmental consultant - 2 wheeled, Environmental consultant - 4 wheeled, and Wheelchair (manual)  OCCUPATION: retired Runner, broadcasting/film/video  LEISURE: gardening, hiking, biking, dancing- unable to participate in any of these due to pain and swelling  HAND  DOMINANCE: right   PRIOR LEVEL OF FUNCTION: Independent with basic ADLs, Independent with household mobility without device, Independent with community mobility without device, Requires assistive device for independence, Needs assistance with homemaking, Needs assistance with transfers, and Leisure: decreased  social participation for leisure pursuits due to impaired mobility and pain  PATIENT GOALS:  Be able to move freely without pain Reduce limb volume to be able to lift limb for basic daily ADLs and functional ambulation Reduce limb volume to increase ability to perform lower body dressing and bathing and grooming Reduce limb to increase body image  OBJECTIVE: Note: Objective measures were completed at Evaluation unless otherwise noted.  COGNITION:  Overall cognitive status: Within functional limits for tasks assessed   OBSERVATIONS / OTHER ASSESSMENTS:   POSTURE: WFL  LE ROM: WFL, but Limited mildly at R knee and ankle due to girth, skin approximation, and joint pain  LE MMT: WFL  Mild, Stage  II, Bilateral Lower Extremity Lymphedema 2/2 CVI and Obesity  Skin  Description Hyper-Keratosis Peau d' Orange Shiny Tight Fibrotic/ Indurated Fatty Doughy Spongy/ boggy   x x x x R>L   x   Skin dry Flaky WNL Macerated   mildly sclerotic     Color Redness Varicosities Blanching Hemosiderin Stain Mottled    x x   x   Odor Malodorous Yeast Fungal infection  WNL      x   Temperature Warm Cool wnl    x     Pitting Edema   1+ 2+ 3+ 4+ Non-pitting         x   Girth Symmetrical Asymmetrical                   Distribution     R>L RLE toes to groin    Stemmer Sign Positive Negative   +    Lymphorrhea History Of:  Present Absent     x    Wounds History Of Present Absent Venous Arterial Pressure Sheer     x        Signs of Infection Redness Warmth Erythema Acute Swelling Drainage Borders                    Sensation Light Touch Deep pressure Hypersensitivity   In tact Impaired In tact Impaired Absent Impaired   x  x  x     Nails WNL   Fungus nail dystrophy   x     Hair Growth Symmetrical Asymmetrical    R>L   Skin Creases Base of toes  Ankles   Base of Fingers knees       Abdominal pannus Thigh Lobules  Face/neck   x x  x        BLE COMPARATIVE LIMB VOLUMETRICS 10/10/23  LANDMARK RIGHT  10/10/23  R LEG (A-D) 5466.5 ml  R THIGH (E-G) 9186.6 ml  R FULL LIMB (A-G) 14653.1 ml  Limb Volume differential (LVD)  LVD for LEG = 44.1%, R>L LVD for THIGH= 33.98%, R>L LVD for FULL lower extremity 37.8%, R>L  Volume change since last 08/11/22 R LEG is INCREASED in volume by 59.6%. L THIGH is INCREASED by 38 %, and RLE full limb is INCREASED by 45.38%.  Volume change overall V  (Blank rows = not tested)  LANDMARK LEFT  10/12/23  L LEG (A-D) 3053.6 ml  L THIGH (E-G) 6064.8 ml  L  FULL LIMB (A-G) 9118.4 ml  Limb Volume differential (LVD)  %  Volume  change since initial %  Volume change overall %  (Blank rows = not tested)   CA-related LYMPHEDEMA Hx:  SURGERY TYPE/DATE: 2013 NUMBER OF LYMPH NODES REMOVED: 30 by report- bilateral pelvic and periaortic CHEMOTHERAPY: yes RADIATION:yes INFECTIONS: no hx cellulitis GAIT: Distance walked: >500 ft Assistive device utilized: None Level of assistance: Modified independence- extra time Comments: R limp  LYMPHEDEMA LIFE IMPACT SCALE (LLIS): Intake 10/12/23 75% (The extent to which lymphedema related problems impacted your life over the past week)  FOTO (functional outcome measure):10/10/23 INTAKE: 36%  PATIENT EDUCATION:  Education details:  Continued Pt/ CG edu for lymphedema self care home program throughout session. Topics include outcome of comparative limb volumetrics- starting limb volume differentials (LVDs), technology and gradient techniques used for short stretch, multilayer compression wrapping, simple self-MLD, therapeutic lymphatic pumping exercises, skin/nail care, LE precautions,. compression garment recommendations and specifications, wear and care schedule and compression garment donning / doffing w assistive devices. Discussed progress towards all OT goals since commencing CDT. All questions answered to the Pt's satisfaction. Good return. Person educated: Patient Education method: Explanation, Demonstration, and Handouts Education comprehension: verbalized understanding, returned demonstration, and needs further education  LE SELF-CARE HOME  PROGRAM: Simple self-MLD/daily to affected quadrant and body part At least 2 x daily BLE Lymphatic Pumping There ex 1 set of 10 reps each, in order, bilaterally Daily skin care to affected body part to limit infection risk and increase skin excursion Compression Bandaging Intensive stage compression: multilayer short stretch wraps with gradient techniques. One limb at a time. Length patient dependent. Self-management Phase: Appropriate daytime compression garment and hours-of-sleep device Compression garments: Custom-made gradient compression garments and hours-of-sleep devices are medically necessary because they are uniquely sized and shaped to fit the exact dimensions of the affected extremities and to provide accurate and consistent gradient compression and containment, essential  for optimally managing chronic, progressive lymphedema. The convoluted HOS devices are medically necessary to facilitate increased lymphatic circulation and limit fibrosis formation when sleeping. Multiple custom compression garments are needed for optimal hygiene to limit infection risk. Custom  compression garments should be replaced q 3-6 months When worn consistently for optimal lipo-lymphedema self-management over time.  Pt will benefit from A6586- Circaid reduction kit whole leg to increase independence with lymphedema self care, to  reduce limb volume  for body symmetry and balance, and to limit infection risk and progression.  ASSESSMENT:  CLINICAL IMPRESSION: 10/12/23: Completed LLE comparative limb volumetrics which reveal dramatic increases in limb volume differential at all landmarks as follows: LVD for Legs = 44.1%, R>L.  LVD for Thighs= 33.98%, R>L. LVD for FULL lower extremities 37.8%, R>L. LVD for Legs is typically 2-5 % depending on dominance and occupational demands. Pt completed LLIS with score of 75%, which    indicates the degree of impact lymphedema -related problems impacted her life last week. We're hopeful that we can reduce this value significantly for OT for CDT. Issued a 2nd set of short stretch compression wraps and devoted the remainder of the session to re-teaching application of multilayer bandages using gradient techniques. Pt demonstrated good effort during practice, but requires less labor intensive form of compression for greater independence w LE self-care.  Pt will benefit from A6586- Circaid reduction kit whole leg to increase independence with lymphedema self care, to  reduce limb volume for better body symmetry and balance, and to limit infection risk and progression. Cont as per POC.   10/10/23 Lymphedema Episode 2, Initial OT Evaluation : Katelee Schupp is a  5 y a female presenting with chronic, progressive, moderate, Stage II, RLE/RLQ cancer -related lymphedema with onset 11 years ago after Rx for endometrial cancer. Pt is well known to this therapist as she has successfully undergone Complete Decongestive Therapy (CDT)  this clinic bin the past. Pt reports recent exacerbation of RLE swelling worsened as inflammation in L hip has worsened over time.  Pt  has a hip replacement scheduled in late December here it [facility]. RE limb volumetrics today reveal that R LEG volume is dramatically increased by 59.65 since last visit. R LEG VOLUME is INCREASED in volume by 59.6%. L THIGH is INCREASED by 38 %, and RLE full limb is INCREASED by 45.38%. Pt presents with LE-related skin changes. Prolonged inflammation in the R hip joint has likely overloaded  already RLE/RLQ lymphatics.  RLE/RLQ lymphedema limits Pt's ability to perform basic and instrumental ADLs, including functional ambulation, mobility and transfers, grooming , lower body bathing and dressing, skin inspection and skin care. Pt has difficulty  reaching her lower legs and feet to apply compression wraps and don/doff        compression stockings. LE limits ability to P[perform instrumental ADLs, including driving, yard work and home management activities. Lymphedema limits her ability to participate in leisure pursuits and productive activities, and it negatively impacts body image, life roles and quality of life.   Pt will benefit from an Intensive and follow along course of lymphedema. CDT will consist of Manual Lymphatic gainage   (MLD), skin care, therapeutic exercise and compression, wraps initially then garments. Pt will need assistance throughout CDT   for applying compression wraps due to limited hip AROM and decreased skin flexibility. Without skilled Occupational Therapy for lymphedema care, lymphedema will progress and further functional decline is expected.   In prep for upcoming hip replacement OT will educate Pt re assistive devices, including tub transfer bench and elevated toilet seat, in keeping with hip precautions.  OBJECTIVE IMPAIRMENTS: Abnormal gait, decreased activity tolerance, decreased balance, decreased knowledge of use of DME, decreased mobility, difficulty walking, decreased ROM, decreased strength, increased edema, decreased skin flexibility, increased fascial restrictions,  impaired sensation, pain, and chronic, progressive LLE/LLQ lymphatic swelling and associated pain.   ADL LIMITATIONS: carrying, lifting, bending, sitting, standing, squatting, sleeping, stairs, transfers, bed mobility, bathing, dressing, hygiene/grooming, and productive activities, leisure pursuits, social participation, body image  driving, shopping, housework, yard work, cooking, meal prep  PERSONAL FACTORS: Past/current experiences and 3+ comorbidities: Morphea Scleroderma, Mixed connective tissue disease, and osteoarthritis  are also affecting patient's functional outcome.   REHAB POTENTIAL: Good  EVALUATION COMPLEXITY: Moderate  GOALS: Goals reviewed with patient? Yes  SHORT TERM GOALS: Target date: 4th OT Rx visit   Pt will demonstrate understanding of lymphedema precautions and prevention strategies with modified independence using a printed reference to identify at least 5 precautions and discussing how s/he may implement them into daily life to reduce risk of progression with extra time.  Baseline:Max A Goal status: INITIAL  2.  Pt will be able to apply multilayer, knee length, gradient, compression wraps to one leg at a time with modified assistance (extra time and assistive device/s) to decrease limb volume, to limit infection risk, and to limit lymphedema progression.  Baseline: Dependent Goal status: INITIAL  LONG TERM GOALS: Target date: 01/09/24 (12 weeks)  Given this patient's Intake score 36% on the functional outcomes FOTO tool, patient will experience an increase in function of 3 points to improve basic and instrumental ADLs performance, including lymphedema  self-care.  Baseline: Max A Goal status: INITIAL  2.  Given this patient's Intake score of  75% on the Lymphedema Life Impact Scale (LLIS), patient will experience a reduction of at least 5 points in her perceived level of functional impairment resulting from lymphedema to improve functional performance and quality of  life (QOL). Baseline: % Goal status: INITIAL  3.  Pt will achieve at least a 10% volume reduction in full, RLE limb volume to return limb to typical size and shape, to limit infection risk and LE progression, to decrease pain, to improve function. Baseline: Dependent Goal status: INITIAL  4.  Pt will obtain proper compression garments/devices and achieve modified independence (extra time + assistive devices) with donning/doffing to optimize limb volume reductions and limit LE  progression over time. Baseline:  Goal status: INITIAL  5.  During Intensive phase CDT , with modified independence, Pt will achieve at least 85% compliance with all lymphedema self-care home program components, including daily skin care, compression wraps and /or garments, simple self MLD and lymphatic pumping therex to habituate LE self care protocol  into ADLs for optimal LE self-management over time. Baseline: Dependent Goal status: INITIAL PLAN:  OT FREQUENCY: 2x/week  OT DURATION: 12 weeks and PRN  PLANNED INTERVENTIONS: Complete Decongestive Therapy (Intensive and supported Self-Management Phases), 97110-Therapeutic exercises, 97530- Therapeutic activity, 97535- Self Care, 54098- Manual therapy, Patient/Family education, Taping, Manual lymph drainage, Scar mobilization, Compression bandaging, DME instructions, and skin care to reduce infection risk throughout manual therapy. Fit with replacement compression garments ASAP  PLAN FOR NEXT SESSION:  Commence full limb multilayer compression wrapping using gradient bandaging techniques Pt and family edu for LE self-care home program- Bandaging   Loel Dubonnet, MS, OTR/L, CLT-LANA 10/13/23 9:08 AM  10/13/2023, 9:08 AM

## 2023-10-20 ENCOUNTER — Other Ambulatory Visit: Payer: Self-pay | Admitting: Orthopedic Surgery

## 2023-10-24 ENCOUNTER — Encounter: Payer: Self-pay | Admitting: Occupational Therapy

## 2023-10-24 ENCOUNTER — Ambulatory Visit: Payer: BC Managed Care – PPO | Admitting: Occupational Therapy

## 2023-10-24 DIAGNOSIS — I890 Lymphedema, not elsewhere classified: Secondary | ICD-10-CM | POA: Diagnosis not present

## 2023-10-24 NOTE — Therapy (Signed)
OUTPATIENT OCCUPATIONAL THERAPY TREATMENT NOTE  LOWER EXTREMITY LYMPHEDEMA  Patient Name: Elizabeth Martin MRN: [id number] DOB:03/31/1961, 62 y.o., female Today's Date: 10/24/2023  END OF SESSION:  Lymphedema Episode 2   OT End of Session - 10/24/23 1602     Visit Number 3    Number of Visits 36    Date for OT Re-Evaluation 01/09/24    OT Start Time 0300    OT Stop Time 0400    OT Time Calculation (min) 60 min    Activity Tolerance Patient tolerated treatment well;No increased pain    Behavior During Therapy WFL for tasks assessed/performed             Past Medical History:  Diagnosis Date   Asthma    Autoimmune disease (HCC)    Cancer (HCC)    Endometrial lymph node removal (30)   Corneal abrasion, right 05/24/2022   GERD (gastroesophageal reflux disease)    H/O blood clots    upper rt groin and behind both knees   History of hiatal hernia    Hypertension    Lymphedema    right leg   Morphea scleroderma    PONV (postoperative nausea and vomiting)    states gets violently ill   Past Surgical History:  Procedure Laterality Date   BASAL CELL CARCINOMA EXCISION Left    CHOLECYSTECTOMY  2008   FLEXIBLE SIGMOIDOSCOPY N/A 05/19/2022   Procedure: FLEXIBLE SIGMOIDOSCOPY;  Surgeon: Toledo, Boykin Nearing, MD;  Location: [facility];  Service: Gastroenterology;  Laterality: N/A;   HERNIA REPAIR  2004   ILEOSTOMY CLOSURE N/A 08/24/2022   Procedure: ILEOSTOMY TAKEDOWN, open loop with Lynden Oxford, PA-C to assist;  Surgeon: Henrene Dodge, MD;  Location: [facility] ORS;  Service: General;  Laterality: N/A;   PLANTAR FASCIECTOMY     ROBOTIC ASSISTED TOTAL HYSTERECTOMY WITH BILATERAL SALPINGO OOPHERECTOMY  02/14/2012   ROTATOR CUFF REPAIR Left 03/24/2022   TONSILLECTOMY     XI ROBOTIC ASSISTED LOWER ANTERIOR RESECTION N/A 05/24/2022   Procedure: XI ROBOTIC ASSISTED LOWER ANTERIOR RESECTION;  Surgeon: Henrene Dodge, MD;  Location: [facility] ORS;  Service: General;  Laterality: N/A;    Patient Active Problem List   Diagnosis Date Noted   Dyspnea 03/07/2023   [facility] spotted fever 03/07/2023   Strain of gastrocnemius tendon 03/07/2023   Lower abdominal pain 03/07/2023   Pain in pelvis 03/07/2023   Small bowel obstruction (HCC) 02/11/2023   Raynaud disease 02/11/2023   Polyarthralgia 02/11/2023   Edema 01/03/2023   Diverticular disease 12/10/2022   Hot flashes 12/10/2022   Pain of right lower extremity 12/10/2022   Paresthesia of lower extremity 12/10/2022   Primary insomnia 12/10/2022   Thyroid nodule 12/10/2022   Multiple joint pain 12/10/2022   S/P closure of ileostomy 08/24/2022   Ileostomy in place [facility])    Morphea 07/13/2022   Colonic stricture (HCC) 05/19/2022   Large bowel obstruction (HCC) 05/19/2022   Hypokalemia 05/18/2022   Abdominal pain 05/18/2022   Osteoarthritis of left knee 02/24/2022   Mixed connective tissue disease (HCC) 02/17/2022   Adhesive capsulitis of left shoulder 01/27/2022   Glenohumeral arthritis, left 01/27/2022   Microscopic hematuria 01/12/2022   Otorrhagia of right ear 12/10/2021   Elevated testosterone level in female 11/04/2021   Rash 10/13/2021   Hashimoto thyroiditis, 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   Hashimoto's disease 03/23/2021   Exposure to severe acute respiratory syndrome coronavirus 2 (SARS-CoV-2) 12/19/2020   Migraine without aura and without status migrainosus, not intractable 08/19/2020   Myofascial  pain 07/30/2020   Neck pain 07/30/2020   Upper back pain 07/30/2020   Hair loss 07/14/2020   Edema of lower extremity 07/11/2020   Gastroesophageal reflux disease 02/15/2020   Diarrhea 02/15/2020   Sleep disorder 01/16/2020   Hyperlipidemia 07/11/2019   Right shoulder pain 04/04/2019   Pre-operative clearance 12/13/2018   Endometrial cancer (HCC) 04/05/2018   Patellofemoral pain syndrome of right knee 03/20/2018   Gastroenteritis 12/07/2017   Fatigue 11/16/2017   Morbid obesity (HCC) 08/11/2017    Stucco keratoses 08/11/2017   Essential hypertension 08/09/2017   History of endometrial cancer 08/09/2017   Lymphedema 08/09/2017   Menopause 08/09/2017   Malignant neoplastic disease (HCC) 04/07/2017   Benign hypertension 06/02/2016   Asthma 06/04/1969    PCP: Pennie Banter, MD  REFERRING PROVIDER: Pennie Banter, MD  REFERRING DIAG: I89.0   THERAPY DIAG:  Lymphedema, not elsewhere classified  Rationale for Evaluation and Treatment: Rehabilitation  ONSET DATE: 2013  (Cancer-related, endometrial 2013)  SUBJECTIVE:                                                                                                                                                                                           SUBJECTIVE STATEMENT:Elizabeth Martin presents to OT for lymphedema care to her RLE/RLQ. Pt presents without . Pt states she has been wrapped constantly since resuming OT for lymphedema care and, "it is really helping" Pt stated she and her spouse went on a  cruise this weekend and she even wrapped on the ship. Pt states LE related pain is unchanged.  PERTINENT HISTORY: Asthma, Autoimmune Disease, Endometrial Ca w/ LND ( 30 bilateral pelvic and periaortic LN), adjuvant chemotherapy and XRT,   H/O blood clots, RLE/RLQ lymphedema, Robotic assisted total hysterectomy w/ bilateral oophorectomy L Rotator Cuff repair, S/P ileostomy 2/2 colon stricture 05/2022 R hip arthroplasty scheduled for 11/11/23.  PAIN:  Are you having pain? Yes: NPRS scale: 5/10 Pain location: RLE, including hip Pain description: aching, heavy, full, tight Aggravating factors: standing, walking, extended dependent sitting Relieving factors: elevation, movement  PRECAUTIONS: Fall and Other: LYMPHEDEMA  WEIGHT BEARING RESTRICTIONS: Yes 5# lifting restriction- shoulder  FALLS:  Has patient fallen in last 6 months? No  LIVING ENVIRONMENT: Lives with: lives with their spouse Lives in: House/apartment Stairs: No;  Has  following equipment at home: Single point cane, Environmental consultant - 2 wheeled, Environmental consultant - 4 wheeled, and Wheelchair (manual)  OCCUPATION: retired Runner, broadcasting/film/video  LEISURE: gardening, hiking, biking, dancing- unable to participate in any of these due to pain and swelling  HAND DOMINANCE: right   PRIOR LEVEL OF FUNCTION: Independent with basic ADLs, Independent with household  mobility without device, Independent with community mobility without device, Requires assistive device for independence, Needs assistance with homemaking, Needs assistance with transfers, and Leisure: decreased  social participation for leisure pursuits due to impaired mobility and pain  PATIENT GOALS:  Be able to move freely without pain Reduce limb volume to be able to lift limb for basic daily ADLs and functional ambulation Reduce limb volume to increase ability to perform lower body dressing and bathing and grooming Reduce limb to increase body image  OBJECTIVE: Note: Objective measures were completed at Evaluation unless otherwise noted.  COGNITION:  Overall cognitive status: Within functional limits for tasks assessed   OBSERVATIONS / OTHER ASSESSMENTS:   POSTURE: WFL  LE ROM: WFL, but Limited mildly at R knee and ankle due to girth, skin approximation, and joint pain  LE MMT: WFL  Mild, Stage  II, Bilateral Lower Extremity Lymphedema 2/2 CVI and Obesity  Skin  Description Hyper-Keratosis Peau d' Orange Shiny Tight Fibrotic/ Indurated Fatty Doughy Spongy/ boggy   x x x x R>L   x   Skin dry Flaky WNL Macerated   mildly sclerotic     Color Redness Varicosities Blanching Hemosiderin Stain Mottled    x x   x   Odor Malodorous Yeast Fungal infection  WNL      x   Temperature Warm Cool wnl    x     Pitting Edema   1+ 2+ 3+ 4+ Non-pitting         x   Girth Symmetrical Asymmetrical                   Distribution    R>L RLE toes to groin    Stemmer Sign Positive Negative   +    Lymphorrhea History Of:   Present Absent     x    Wounds History Of Present Absent Venous Arterial Pressure Sheer     x        Signs of Infection Redness Warmth Erythema Acute Swelling Drainage Borders                    Sensation Light Touch Deep pressure Hypersensitivity   In tact Impaired In tact Impaired Absent Impaired   x  x  x     Nails WNL   Fungus nail dystrophy   x     Hair Growth Symmetrical Asymmetrical    R>L   Skin Creases Base of toes  Ankles   Base of Fingers knees       Abdominal pannus Thigh Lobules  Face/neck   x x  x        BLE COMPARATIVE LIMB VOLUMETRICS 10/10/23  LANDMARK RIGHT  10/10/23  R LEG (A-D) 5466.5 ml  R THIGH (E-G) 9186.6 ml  R FULL LIMB (A-G) 14653.1 ml  Limb Volume differential (LVD)  LVD for LEG = 44.1%, R>L LVD for THIGH= 33.98%, R>L LVD for FULL lower extremity 37.8%, R>L  Volume change since last 08/11/22 R LEG is INCREASED in volume by 59.6%. L THIGH is INCREASED by 38 %, and RLE full limb is INCREASED by 45.38%.  Volume change overall V  (Blank rows = not tested)  LANDMARK LEFT  10/12/23  L LEG (A-D) 3053.6 ml  L THIGH (E-G) 6064.8 ml  L  FULL LIMB (A-G) 9118.4 ml  Limb Volume differential (LVD)  %  Volume change since initial %  Volume change overall %  (Blank rows = not tested)  CA-related LYMPHEDEMA Hx:  SURGERY TYPE/DATE: 2013 NUMBER OF LYMPH NODES REMOVED: 30 by report- bilateral pelvic and periaortic CHEMOTHERAPY: yes RADIATION:yes INFECTIONS: no hx cellulitis GAIT: Distance walked: >500 ft Assistive device utilized: None Level of assistance: Modified independence- extra time Comments: R limp  LYMPHEDEMA LIFE IMPACT SCALE (LLIS): Intake 10/12/23 75% (The extent to which lymphedema related problems impacted your life over the past week)  FOTO (functional outcome measure):10/10/23 INTAKE: 36%  PATIENT EDUCATION:  Education details: Continued Pt/ CG edu for lymphedema self care home program throughout session. Topics include  outcome of comparative limb volumetrics- starting limb volume differentials (LVDs), technology and gradient techniques used for short stretch, multilayer compression wrapping, simple self-MLD, therapeutic lymphatic pumping exercises, skin/nail care, LE precautions,. compression garment recommendations and specifications, wear and care schedule and compression garment donning / doffing w assistive devices. Discussed progress towards all OT goals since commencing CDT. All questions answered to the Pt's satisfaction. Good return. Person educated: Patient Education method: Explanation, Demonstration, and Handouts Education comprehension: verbalized understanding, returned demonstration, and needs further education  LE SELF-CARE HOME  PROGRAM: Simple self-MLD/daily to affected quadrant and body part At least 2 x daily BLE Lymphatic Pumping There ex 1 set of 10 reps each, in order, bilaterally Daily skin care to affected body part to limit infection risk and increase skin excursion Compression Bandaging Intensive stage compression: multilayer short stretch wraps with gradient techniques. One limb at a time. Length patient dependent. Self-management Phase: Appropriate daytime compression garment and hours-of-sleep device Compression garments: Custom-made gradient compression garments and hours-of-sleep devices are medically necessary because they are uniquely sized and shaped to fit the exact dimensions of the affected extremities and to provide accurate and consistent gradient compression and containment, essential  for optimally managing chronic, progressive lymphedema. The convoluted HOS devices are medically necessary to facilitate increased lymphatic circulation and limit fibrosis formation when sleeping. Multiple custom compression garments are needed for optimal hygiene to limit infection risk. Custom compression garments should be replaced q 3-6 months When worn consistently for optimal lipo-lymphedema  self-management over time.  Pt will benefit from [id number]- Circaid reduction kit whole leg to increase independence with lymphedema self care, to  reduce limb volume  for body symmetry and balance, and to limit infection risk and progression.  ASSESSMENT:  CLINICAL IMPRESSION: Commenced RLE/RLQ MLD today, utilizing IA pathway. Emphasis on fibrosis on leg. No changing in volume noted immediately after manual therapy. Continued teaching self compression wrapping focusing in on the ways to ensure  one   constructs a proper, effective gradient, and how to wraps to keep each layer firmly in place. Productive session. Pt's full limb volume has reduced significantly since recommencing OT, which in tern will speed healing after sx and improve AROM to regain mobility. Cont as per POC.   10/10/23 Lymphedema Episode 2, Initial OT Evaluation : Amilian Jovic is a 36 y a female presenting with chronic, progressive, moderate, Stage II, RLE/RLQ cancer -related lymphedema with onset 11 years ago after Rx for endometrial cancer. Pt is well known to this therapist as she has successfully undergone Complete Decongestive Therapy (CDT)  this clinic bin the past. Pt reports recent exacerbation of RLE swelling worsened as inflammation in L hip has worsened over time.  Pt has a hip replacement scheduled in late December here [facility]. RE limb volumetrics today reveal that R LEG volume is dramatically increased by 59.65 since last visit. R LEG VOLUME is INCREASED in volume by 59.6%. L THIGH is INCREASED  by 38 %, and RLE full limb is INCREASED by 45.38%. Pt presents with LE-related skin changes. Prolonged inflammation in the R hip joint has likely overloaded  already RLE/RLQ lymphatics.  RLE/RLQ lymphedema limits Pt's ability to perform basic and instrumental ADLs, including functional ambulation, mobility and transfers, grooming , lower body bathing and dressing, skin inspection and skin care. Pt has difficulty  reaching her lower  legs and feet to apply compression wraps and don/doff        compression stockings. LE limits ability to P[perform instrumental ADLs, including driving, yard work and home management activities. Lymphedema limits her ability to participate in leisure pursuits and productive activities, and it negatively impacts body image, life roles and quality of life.   Pt will benefit from an Intensive and follow along course of lymphedema. CDT will consist of Manual Lymphatic gainage   (MLD), skin care, therapeutic exercise and compression, wraps initially then garments. Pt will need assistance throughout CDT   for applying compression wraps due to limited hip AROM and decreased skin flexibility. Without skilled Occupational Therapy for lymphedema care, lymphedema will progress and further functional decline is expected.   In prep for upcoming hip replacement OT will educate Pt re assistive devices, including tub transfer bench and elevated toilet seat, in keeping with hip precautions.  OBJECTIVE IMPAIRMENTS: Abnormal gait, decreased activity tolerance, decreased balance, decreased knowledge of use of DME, decreased mobility, difficulty walking, decreased ROM, decreased strength, increased edema, decreased skin flexibility, increased fascial restrictions, impaired sensation, pain, and chronic, progressive LLE/LLQ lymphatic swelling and associated pain.   ADL LIMITATIONS: carrying, lifting, bending, sitting, standing, squatting, sleeping, stairs, transfers, bed mobility, bathing, dressing, hygiene/grooming, and productive activities, leisure pursuits, social participation, body image  driving, shopping, housework, yard work, cooking, meal prep  PERSONAL FACTORS: Past/current experiences and 3+ comorbidities: Morphea Scleroderma, Mixed connective tissue disease, and osteoarthritis  are also affecting patient's functional outcome.   REHAB POTENTIAL: Good  EVALUATION COMPLEXITY: Moderate  GOALS: Goals reviewed with  patient? Yes  SHORT TERM GOALS: Target date: 4th OT Rx visit   Pt will demonstrate understanding of lymphedema precautions and prevention strategies with modified independence using a printed reference to identify at least 5 precautions and discussing how s/he may implement them into daily life to reduce risk of progression with extra time.  Baseline:Max A Goal status: INITIAL  2.  Pt will be able to apply multilayer, knee length, gradient, compression wraps to one leg at a time with modified assistance (extra time and assistive device/s) to decrease limb volume, to limit infection risk, and to limit lymphedema progression.  Baseline: Dependent Goal status: INITIAL  LONG TERM GOALS: Target date: 01/09/24 (12 weeks)  Given this patient's Intake score 36% on the functional outcomes FOTO tool, patient will experience an increase in function of 3 points to improve basic and instrumental ADLs performance, including lymphedema self-care.  Baseline: Max A Goal status: INITIAL  2.  Given this patient's Intake score of  75% on the Lymphedema Life Impact Scale (LLIS), patient will experience a reduction of at least 5 points in her perceived level of functional impairment resulting from lymphedema to improve functional performance and quality of life (QOL). Baseline: % Goal status: INITIAL  3.  Pt will achieve at least a 10% volume reduction in full, RLE limb volume to return limb to typical size and shape, to limit infection risk and LE progression, to decrease pain, to improve function. Baseline: Dependent Goal status: INITIAL  4.  Pt  will obtain proper compression garments/devices and achieve modified independence (extra time + assistive devices) with donning/doffing to optimize limb volume reductions and limit LE  progression over time. Baseline:  Goal status: INITIAL  5.  During Intensive phase CDT , with modified independence, Pt will achieve at least 85% compliance with all lymphedema  self-care home program components, including daily skin care, compression wraps and /or garments, simple self MLD and lymphatic pumping therex to habituate LE self care protocol  into ADLs for optimal LE self-management over time. Baseline: Dependent Goal status: INITIAL PLAN:  OT FREQUENCY: 2x/week  OT DURATION: 12 weeks and PRN  PLANNED INTERVENTIONS: Complete Decongestive Therapy (Intensive and supported Self-Management Phases), 97110-Therapeutic exercises, 97530- Therapeutic activity, 97535- Self Care, 16109- Manual therapy, Patient/Family education, Taping, Manual lymph drainage, Scar mobilization, Compression bandaging, DME instructions, and skin care to reduce infection risk throughout manual therapy. Fit with replacement compression garments ASAP  PLAN FOR NEXT SESSION:  Commence full limb multilayer compression wrapping using gradient bandaging techniques Pt and family edu for LE self-care home program- Bandaging   Loel Dubonnet, MS, OTR/L, CLT-LANA 10/24/23 4:03 PM  10/24/2023, 4:03 PM

## 2023-10-26 ENCOUNTER — Ambulatory Visit: Payer: BC Managed Care – PPO | Admitting: Occupational Therapy

## 2023-10-26 DIAGNOSIS — I890 Lymphedema, not elsewhere classified: Secondary | ICD-10-CM | POA: Diagnosis not present

## 2023-10-31 ENCOUNTER — Encounter: Payer: Self-pay | Admitting: Occupational Therapy

## 2023-10-31 ENCOUNTER — Ambulatory Visit: Payer: BC Managed Care – PPO | Admitting: Occupational Therapy

## 2023-10-31 DIAGNOSIS — I890 Lymphedema, not elsewhere classified: Secondary | ICD-10-CM

## 2023-10-31 NOTE — Therapy (Unsigned)
OUTPATIENT OCCUPATIONAL THERAPY TREATMENT NOTE  LOWER EXTREMITY LYMPHEDEMA  Patient Name: Elizabeth Martin MRN: [id number] DOB:[birth year], 62 y.o., female Today's Date: 11/01/2023  END OF SESSION:  Lymphedema Episode 2   OT End of Session - 11/01/23 2147     Visit Number 6    Number of Visits 36    Date for OT Re-Evaluation 01/09/24    OT Start Time 0900    OT Stop Time 1000    OT Time Calculation (min) 60 min    Activity Tolerance Patient tolerated treatment well;No increased pain    Behavior During Therapy WFL for tasks assessed/performed             Past Medical History:  Diagnosis Date   Anxiety    Asthma    Autoimmune disease (HCC)    Cancer (HCC)    Endometrial lymph node removal (30)   Corneal abrasion, right 05/24/2022   Depression    GERD (gastroesophageal reflux disease)    H/O blood clots    upper rt groin and behind both knees   History of hiatal hernia    Hypertension    Lymphedema    right leg   Morphea scleroderma    PONV (postoperative nausea and vomiting)    states gets violently ill   Past Surgical History:  Procedure Laterality Date   BASAL CELL CARCINOMA EXCISION Left    CHOLECYSTECTOMY  2008   FLEXIBLE SIGMOIDOSCOPY N/A 05/19/2022   Procedure: FLEXIBLE SIGMOIDOSCOPY;  Surgeon: Toledo, Boykin Nearing, MD;  Location: [facility];  Service: Gastroenterology;  Laterality: N/A;   HERNIA REPAIR  2004   ILEOSTOMY CLOSURE N/A 08/24/2022   Procedure: ILEOSTOMY TAKEDOWN, open loop with Lynden Oxford, PA-C to assist;  Surgeon: Henrene Dodge, MD;  Location: [facility] OR[facility];  Service: General;  Laterality: N/A;   PLANTAR FASCIECTOMY     ROBOTIC ASSISTED TOTAL HYSTERECTOMY WITH BILATERAL SALPINGO OOPHERECTOMY  02/14/2012   ROTATOR CUFF REPAIR Left 03/24/2022   TONSILLECTOMY     XI ROBOTIC ASSISTED LOWER ANTERIOR RESECTION N/A 05/24/2022   Procedure: XI ROBOTIC ASSISTED LOWER ANTERIOR RESECTION;  Surgeon: Henrene Dodge, MD;  Location: [facility] ORS;  Service:  General;  Laterality: N/A;   Patient Active Problem List   Diagnosis Date Noted   Dyspnea 03/07/2023   [facility] spotted fever 03/07/2023   Strain of gastrocnemius tendon 03/07/2023   Lower abdominal pain 03/07/2023   Pain in pelvis 03/07/2023   Small bowel obstruction (HCC) 02/11/2023   Raynaud disease 02/11/2023   Polyarthralgia 02/11/2023   Edema 01/03/2023   Diverticular disease 12/10/2022   Hot flashes 12/10/2022   Pain of right lower extremity 12/10/2022   Paresthesia of lower extremity 12/10/2022   Primary insomnia 12/10/2022   Thyroid nodule 12/10/2022   Multiple joint pain 12/10/2022   S/P closure of ileostomy 08/24/2022   Ileostomy in place [facility])    Morphea 07/13/2022   Colonic stricture (HCC) 05/19/2022   Large bowel obstruction (HCC) 05/19/2022   Hypokalemia 05/18/2022   Abdominal pain 05/18/2022   Osteoarthritis of left knee 02/24/2022   Mixed connective tissue disease (HCC) 02/17/2022   Adhesive capsulitis of left shoulder 01/27/2022   Glenohumeral arthritis, left 01/27/2022   Microscopic hematuria 01/12/2022   Otorrhagia of right ear 12/10/2021   Elevated testosterone level in female 11/04/2021   Rash 10/13/2021   Hashimoto thyroiditis, 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   Hashimoto's disease 03/23/2021   Exposure to severe acute respiratory syndrome coronavirus 2 (SARS-CoV-2) 12/19/2020   Migraine without aura and without  status migrainosus, not intractable 08/19/2020   Myofascial pain 07/30/2020   Neck pain 07/30/2020   Upper back pain 07/30/2020   Hair loss 07/14/2020   Edema of lower extremity 07/11/2020   Gastroesophageal reflux disease 02/15/2020   Diarrhea 02/15/2020   Sleep disorder 01/16/2020   Hyperlipidemia 07/11/2019   Right shoulder pain 04/04/2019   Pre-operative clearance 12/13/2018   Endometrial cancer (HCC) 04/05/2018   Patellofemoral pain syndrome of right knee 03/20/2018   Gastroenteritis 12/07/2017   Fatigue 11/16/2017   Morbid  obesity (HCC) 08/11/2017   Stucco keratoses 08/11/2017   Essential hypertension 08/09/2017   History of endometrial cancer 08/09/2017   Lymphedema 08/09/2017   Menopause 08/09/2017   Malignant neoplastic disease (HCC) 04/07/2017   Benign hypertension 06/02/2016   Asthma 06/04/1969    PCP: Pennie Banter, MD  REFERRING PROVIDER: Pennie Banter, MD  REFERRING DIAG: I89.0   THERAPY DIAG:  Lymphedema, not elsewhere classified  Rationale for Evaluation and Treatment: Rehabilitation  ONSET DATE: 2013  (Cancer-related, endometrial 2013)  SUBJECTIVE:                                                                                                                                                                                           SUBJECTIVE STATEMENT:Elizabeth Martin presents to OT for lymphedema care to her RLE/RLQ. Pt presents without  wraps in place. Pt states she has been wrapped constantly since resuming OT for lymphedema care in prep for her upcoming R THR. Pt states LE related pain is unchanged.  PERTINENT HISTORY: Asthma, Autoimmune Disease, Endometrial Ca w/ LND ( 30 bilateral pelvic and periaortic LN), adjuvant chemotherapy and XRT,   H/O blood clots, RLE/RLQ lymphedema, Robotic assisted total hysterectomy w/ bilateral oophorectomy L Rotator Cuff repair, S/P ileostomy 2/2 colon stricture 05/2022 R hip arthroplasty scheduled for 11/11/23.  PAIN:  Are you having pain? Yes: NPRS scale: 5/10 Pain location: RLE, including hip Pain description: aching, heavy, full, tight Aggravating factors: standing, walking, extended dependent sitting Relieving factors: elevation, movement  PRECAUTIONS: Fall and Other: LYMPHEDEMA  WEIGHT BEARING RESTRICTIONS: Yes 5# lifting restriction- shoulder  FALLS:  Has patient fallen in last 6 months? No  LIVING ENVIRONMENT: Lives with: lives with their spouse Lives in: House/apartment Stairs: No;  Has following equipment at home: Single point cane,  Environmental consultant - 2 wheeled, Environmental consultant - 4 wheeled, and Wheelchair (manual)  OCCUPATION: retired Runner, broadcasting/film/video  LEISURE: gardening, hiking, biking, dancing- unable to participate in any of these due to pain and swelling  HAND DOMINANCE: right   PRIOR LEVEL OF FUNCTION: Independent with basic ADLs, Independent with household mobility without device, Independent with community mobility  without device, Requires assistive device for independence, Needs assistance with homemaking, Needs assistance with transfers, and Leisure: decreased  social participation for leisure pursuits due to impaired mobility and pain  PATIENT GOALS:  Be able to move freely without pain Reduce limb volume to be able to lift limb for basic daily ADLs and functional ambulation Reduce limb volume to increase ability to perform lower body dressing and bathing and grooming Reduce limb to increase body image  OBJECTIVE: Note: Objective measures were completed at Evaluation unless otherwise noted.  COGNITION:  Overall cognitive status: Within functional limits for tasks assessed   OBSERVATIONS / OTHER ASSESSMENTS:   POSTURE: WFL  LE ROM: WFL, but Limited mildly at R knee and ankle due to girth, skin approximation, and joint pain  LE MMT: WFL  Mild, Stage  II, Bilateral Lower Extremity Lymphedema 2/2 CVI and Obesity  Skin  Description Hyper-Keratosis Peau d' Orange Shiny Tight Fibrotic/ Indurated Fatty Doughy Spongy/ boggy   x x x x R>L   x   Skin dry Flaky WNL Macerated   mildly sclerotic     Color Redness Varicosities Blanching Hemosiderin Stain Mottled    x x   x   Odor Malodorous Yeast Fungal infection  WNL      x   Temperature Warm Cool wnl    x     Pitting Edema   1+ 2+ 3+ 4+ Non-pitting         x   Girth Symmetrical Asymmetrical                   Distribution    R>L RLE toes to groin    Stemmer Sign Positive Negative   +    Lymphorrhea History Of:  Present Absent     x    Wounds History Of  Present Absent Venous Arterial Pressure Sheer     x        Signs of Infection Redness Warmth Erythema Acute Swelling Drainage Borders                    Sensation Light Touch Deep pressure Hypersensitivity   In tact Impaired In tact Impaired Absent Impaired   x  x  x     Nails WNL   Fungus nail dystrophy   x     Hair Growth Symmetrical Asymmetrical    R>L   Skin Creases Base of toes  Ankles   Base of Fingers knees       Abdominal pannus Thigh Lobules  Face/neck   x x  x        BLE COMPARATIVE LIMB VOLUMETRICS 10/10/23  LANDMARK RIGHT  10/10/23  R LEG (A-D) 5466.5 ml  R THIGH (E-G) 9186.6 ml  R FULL LIMB (A-G) 14653.1 ml  Limb Volume differential (LVD)  LVD for LEG = 44.1%, R>L LVD for THIGH= 33.98%, R>L LVD for FULL lower extremity 37.8%, R>L  Volume change since last 08/11/22 R LEG is INCREASED in volume by 59.6%. L THIGH is INCREASED by 38 %, and RLE full limb is INCREASED by 45.38%.  Volume change overall V  (Blank rows = not tested)  LANDMARK LEFT  10/12/23  L LEG (A-D) 3053.6 ml  L THIGH (E-G) 6064.8 ml  L  FULL LIMB (A-G) 9118.4 ml  Limb Volume differential (LVD)  %  Volume change since initial %  Volume change overall %  (Blank rows = not tested)   CA-related LYMPHEDEMA Hx:  SURGERY  TYPE/DATE: 2013 NUMBER OF LYMPH NODES REMOVED: 30 by report- bilateral pelvic and periaortic CHEMOTHERAPY: yes RADIATION:yes INFECTIONS: no hx cellulitis GAIT: Distance walked: >500 ft Assistive device utilized: None Level of assistance: Modified independence- extra time Comments: R limp  LYMPHEDEMA LIFE IMPACT SCALE (LLIS): Intake 10/12/23 75% (The extent to which lymphedema related problems impacted your life over the past week)  FOTO (functional outcome measure):10/10/23 INTAKE: 36%  PATIENT EDUCATION:  Education details: Continued Pt/ CG edu for lymphedema self care home program throughout session. Topics include outcome of comparative limb volumetrics- starting  limb volume differentials (LVDs), technology and gradient techniques used for short stretch, multilayer compression wrapping, simple self-MLD, therapeutic lymphatic pumping exercises, skin/nail care, LE precautions,. compression garment recommendations and specifications, wear and care schedule and compression garment donning / doffing w assistive devices. Discussed progress towards all OT goals since commencing CDT. All questions answered to the Pt's satisfaction. Good return. Person educated: Patient Education method: Explanation, Demonstration, and Handouts Education comprehension: verbalized understanding, returned demonstration, and needs further education  LE SELF-CARE HOME  PROGRAM: Simple self-MLD/daily to affected quadrant and body part At least 2 x daily BLE Lymphatic Pumping There ex 1 set of 10 reps each, in order, bilaterally Daily skin care to affected body part to limit infection risk and increase skin excursion Compression Bandaging Intensive stage compression: multilayer short stretch wraps with gradient techniques. One limb at a time. Length patient dependent. Self-management Phase: Appropriate daytime compression garment and hours-of-sleep device Compression garments: Custom-made gradient compression garments and hours-of-sleep devices are medically necessary because they are uniquely sized and shaped to fit the exact dimensions of the affected extremities and to provide accurate and consistent gradient compression and containment, essential  for optimally managing chronic, progressive lymphedema. The convoluted HOS devices are medically necessary to facilitate increased lymphatic circulation and limit fibrosis formation when sleeping. Multiple custom compression garments are needed for optimal hygiene to limit infection risk. Custom compression garments should be replaced q 3-6 months When worn consistently for optimal lipo-lymphedema self-management over time.  Pt will benefit  from [id number]- Circaid reduction kit whole leg to increase independence with lymphedema self care, to  reduce limb volume  for body symmetry and balance, and to limit infection risk and progression.  ASSESSMENT:  CLINICAL IMPRESSION: Emphasis of visit on customizing Mediven full leg reduction kit to fit anatomical measurements completed today. Completed 2 of 3 component pieces, including the leg and thigh portions. Unfortunately we ran out of time when prepping the knee  component. Vendor agrees to assist with cutting this piece   using our measurements to ensure optimal fit and function. Pt will use the reduction kit in lieu of bandaging after surgery in effort to limiting post surgical swelling. Achieved good fit on pieces we completed today. Pt will apply compression wraps at home due to time constraints. Pt education re wear schedule and proper fitting  provided throughout session with good return from patient. Cont as per POC.   10/10/23 Lymphedema Episode 2, Initial OT Evaluation : Elizabeth Martin is a 21 y a female presenting with chronic, progressive, moderate, Stage II, RLE/RLQ cancer -related lymphedema with onset 11 years ago after Rx for endometrial cancer. Pt is well known to this therapist as she has successfully undergone Complete Decongestive Therapy (CDT)  this clinic bin the past. Pt reports recent exacerbation of RLE swelling worsened as inflammation in L hip has worsened over time.  Pt has a hip replacement scheduled in late December here it [facility].  RE limb volumetrics today reveal that R LEG volume is dramatically increased by 59.65 since last visit. R LEG VOLUME is INCREASED in volume by 59.6%. L THIGH is INCREASED by 38 %, and RLE full limb is INCREASED by 45.38%. Pt presents with LE-related skin changes. Prolonged inflammation in the R hip joint has likely overloaded  already RLE/RLQ lymphatics.  RLE/RLQ lymphedema limits Pt's ability to perform basic and instrumental ADLs, including  functional ambulation, mobility and transfers, grooming , lower body bathing and dressing, skin inspection and skin care. Pt has difficulty  reaching her lower legs and feet to apply compression wraps and don/doff        compression stockings. LE limits ability to P[perform instrumental ADLs, including driving, yard work and home management activities. Lymphedema limits her ability to participate in leisure pursuits and productive activities, and it negatively impacts body image, life roles and quality of life.   Pt will benefit from an Intensive and follow along course of lymphedema. CDT will consist of Manual Lymphatic gainage   (MLD), skin care, therapeutic exercise and compression, wraps initially then garments. Pt will need assistance throughout CDT   for applying compression wraps due to limited hip AROM and decreased skin flexibility. Without skilled Occupational Therapy for lymphedema care, lymphedema will progress and further functional decline is expected.   In prep for upcoming hip replacement OT will educate Pt re assistive devices, including tub transfer bench and elevated toilet seat, in keeping with hip precautions.  OBJECTIVE IMPAIRMENTS: Abnormal gait, decreased activity tolerance, decreased balance, decreased knowledge of use of DME, decreased mobility, difficulty walking, decreased ROM, decreased strength, increased edema, decreased skin flexibility, increased fascial restrictions, impaired sensation, pain, and chronic, progressive LLE/LLQ lymphatic swelling and associated pain.   ADL LIMITATIONS: carrying, lifting, bending, sitting, standing, squatting, sleeping, stairs, transfers, bed mobility, bathing, dressing, hygiene/grooming, and productive activities, leisure pursuits, social participation, body image  driving, shopping, housework, yard work, cooking, meal prep  PERSONAL FACTORS: Past/current experiences and 3+ comorbidities: Morphea Scleroderma, Mixed connective tissue disease,  and osteoarthritis  are also affecting patient's functional outcome.   REHAB POTENTIAL: Good  EVALUATION COMPLEXITY: Moderate  GOALS: Goals reviewed with patient? Yes  SHORT TERM GOALS: Target date: 4th OT Rx visit   Pt will demonstrate understanding of lymphedema precautions and prevention strategies with modified independence using a printed reference to identify at least 5 precautions and discussing how s/he may implement them into daily life to reduce risk of progression with extra time.  Baseline:Max A Goal status: INITIAL  2.  Pt will be able to apply multilayer, knee length, gradient, compression wraps to one leg at a time with modified assistance (extra time and assistive device/s) to decrease limb volume, to limit infection risk, and to limit lymphedema progression.  Baseline: Dependent Goal status: INITIAL  LONG TERM GOALS: Target date: 01/09/24 (12 weeks)  Given this patient's Intake score 36% on the functional outcomes FOTO tool, patient will experience an increase in function of 3 points to improve basic and instrumental ADLs performance, including lymphedema self-care.  Baseline: Max A Goal status: INITIAL  2.  Given this patient's Intake score of  75% on the Lymphedema Life Impact Scale (LLIS), patient will experience a reduction of at least 5 points in her perceived level of functional impairment resulting from lymphedema to improve functional performance and quality of life (QOL). Baseline: % Goal status: INITIAL  3.  Pt will achieve at least a 10% volume reduction in full, RLE limb volume  to return limb to typical size and shape, to limit infection risk and LE progression, to decrease pain, to improve function. Baseline: Dependent Goal status: INITIAL  4.  Pt will obtain proper compression garments/devices and achieve modified independence (extra time + assistive devices) with donning/doffing to optimize limb volume reductions and limit LE  progression over  time. Baseline:  Goal status: INITIAL  5.  During Intensive phase CDT , with modified independence, Pt will achieve at least 85% compliance with all lymphedema self-care home program components, including daily skin care, compression wraps and /or garments, simple self MLD and lymphatic pumping therex to habituate LE self care protocol  into ADLs for optimal LE self-management over time. Baseline: Dependent Goal status: INITIAL PLAN:  OT FREQUENCY: 2x/week  OT DURATION: 12 weeks and PRN  PLANNED INTERVENTIONS: Complete Decongestive Therapy (Intensive and supported Self-Management Phases), 97110-Therapeutic exercises, 97530- Therapeutic activity, 97535- Self Care, [id number]- Manual therapy, Patient/Family education, Taping, Manual lymph drainage, Scar mobilization, Compression bandaging, DME instructions, and skin care to reduce infection risk throughout manual therapy. Fit with replacement compression garments ASAP  PLAN FOR NEXT SESSION:  Commence full limb multilayer compression wrapping using gradient bandaging techniques Pt and family edu for LE self-care home program- Bandaging   Loel Dubonnet, MS, OTR/L, CLT-LANA 11/01/23 9:50 PM  11/01/2023, 9:50 PM

## 2023-11-01 ENCOUNTER — Other Ambulatory Visit: Payer: Self-pay

## 2023-11-01 ENCOUNTER — Encounter
Admission: RE | Admit: 2023-11-01 | Discharge: 2023-11-01 | Disposition: A | Discharge status: Home / Self Care | Payer: BC Managed Care – PPO | Source: Ambulatory Visit | Attending: Orthopedic Surgery

## 2023-11-01 VITALS — BP 121/71 | HR 97 | Resp 14 | Ht 67.0 in | Wt 176.0 lb

## 2023-11-01 DIAGNOSIS — Z01818 Encounter for other preprocedural examination: Secondary | ICD-10-CM | POA: Diagnosis present

## 2023-11-01 HISTORY — DX: Depression, unspecified: F32.A

## 2023-11-01 HISTORY — DX: Anxiety disorder, unspecified: F41.9

## 2023-11-01 LAB — CBC WITH DIFFERENTIAL/PLATELET
Abs Immature Granulocytes: 0.03 10*3/uL (ref 0.00–0.07)
Basophils Absolute: 0 10*3/uL (ref 0.0–0.1)
Basophils Relative: 0 %
Eosinophils Absolute: 0.2 10*3/uL (ref 0.0–0.5)
Eosinophils Relative: 2 %
HCT: 37.4 % (ref 36.0–46.0)
Hemoglobin: 13.4 g/dL (ref 12.0–15.0)
Immature Granulocytes: 0 %
Lymphocytes Relative: 12 %
Lymphs Abs: 1 10*3/uL (ref 0.7–4.0)
MCH: 30.8 pg (ref 26.0–34.0)
MCHC: 35.8 g/dL (ref 30.0–36.0)
MCV: 86 fL (ref 80.0–100.0)
Monocytes Absolute: 0.8 10*3/uL (ref 0.1–1.0)
Monocytes Relative: 9 %
Neutro Abs: 6.7 10*3/uL (ref 1.7–7.7)
Neutrophils Relative %: 77 %
Platelets: 366 10*3/uL (ref 150–400)
RBC: 4.35 MIL/uL (ref 3.87–5.11)
RDW: 15.5 % (ref 11.5–15.5)
WBC: 8.7 10*3/uL (ref 4.0–10.5)
nRBC: 0 % (ref 0.0–0.2)

## 2023-11-01 LAB — COMPREHENSIVE METABOLIC PANEL
ALT: 19 U/L (ref 0–44)
AST: 18 U/L (ref 15–41)
Albumin: 3.8 g/dL (ref 3.5–5.0)
Alkaline Phosphatase: 57 U/L (ref 38–126)
Anion gap: 8 (ref 5–15)
BUN: 10 mg/dL (ref 8–23)
CO2: 25 mmol/L (ref 22–32)
Calcium: 8.6 mg/dL — ABNORMAL LOW (ref 8.9–10.3)
Chloride: 102 mmol/L (ref 98–111)
Creatinine, Ser: 0.56 mg/dL (ref 0.44–1.00)
GFR, Estimated: >60 mL/min (ref >60)
Glucose, Bld: 100 mg/dL — ABNORMAL HIGH (ref 70–99)
Potassium: 3.9 mmol/L (ref 3.5–5.1)
Sodium: 135 mmol/L (ref 135–145)
Total Bilirubin: 0.6 mg/dL (ref <1.2)
Total Protein: 6.1 g/dL — ABNORMAL LOW (ref 6.5–8.1)

## 2023-11-01 LAB — URINALYSIS, ROUTINE W REFLEX MICROSCOPIC
Bacteria, UA: NONE SEEN
Bilirubin Urine: NEGATIVE
Glucose, UA: NEGATIVE mg/dL
Ketones, ur: NEGATIVE mg/dL
Leukocytes,Ua: NEGATIVE
Nitrite: NEGATIVE
Protein, ur: NEGATIVE mg/dL
Specific Gravity, Urine: 1.005 (ref 1.005–1.030)
Squamous Epithelial / HPF: 0 /[HPF] (ref 0–5)
pH: 6 (ref 5.0–8.0)

## 2023-11-01 LAB — TYPE AND SCREEN
ABO/RH(D): O NEG
Antibody Screen: NEGATIVE

## 2023-11-01 LAB — SURGICAL PCR SCREEN
MRSA, PCR: NEGATIVE
Staphylococcus aureus: NEGATIVE

## 2023-11-01 NOTE — Therapy (Signed)
OUTPATIENT OCCUPATIONAL THERAPY TREATMENT NOTE  LOWER EXTREMITY LYMPHEDEMA  Patient Name: Elizabeth Martin MRN: [id number] DOB:1961-01-26, 62 y.o., female Today's Date: 11/01/2023  END OF SESSION:  Lymphedema Episode 2   OT End of Session - 11/01/23 1914     Visit Number 5    Number of Visits 36    Date for OT Re-Evaluation 01/09/24    OT Start Time 0313    OT Stop Time 0400    OT Time Calculation (min) 47 min    Activity Tolerance Patient tolerated treatment well;No increased pain    Behavior During Therapy WFL for tasks assessed/performed             Past Medical History:  Diagnosis Date   Anxiety    Asthma    Autoimmune disease (HCC)    Cancer (HCC)    Endometrial lymph node removal (30)   Corneal abrasion, right 05/24/2022   Depression    GERD (gastroesophageal reflux disease)    H/O blood clots    upper rt groin and behind both knees   History of hiatal hernia    Hypertension    Lymphedema    right leg   Morphea scleroderma    PONV (postoperative nausea and vomiting)    states gets violently ill   Past Surgical History:  Procedure Laterality Date   BASAL CELL CARCINOMA EXCISION Left    CHOLECYSTECTOMY  2008   FLEXIBLE SIGMOIDOSCOPY N/A 05/19/2022   Procedure: FLEXIBLE SIGMOIDOSCOPY;  Surgeon: Toledo, Boykin Nearing, MD;  Location: [facility];  Service: Gastroenterology;  Laterality: N/A;   HERNIA REPAIR  2004   ILEOSTOMY CLOSURE N/A 08/24/2022   Procedure: ILEOSTOMY TAKEDOWN, open loop with Lynden Oxford, PA-C to assist;  Surgeon: Henrene Dodge, MD;  Location: [facility] OR[facility];  Service: General;  Laterality: N/A;   PLANTAR FASCIECTOMY     ROBOTIC ASSISTED TOTAL HYSTERECTOMY WITH BILATERAL SALPINGO OOPHERECTOMY  02/14/2012   ROTATOR CUFF REPAIR Left 03/24/2022   TONSILLECTOMY     XI ROBOTIC ASSISTED LOWER ANTERIOR RESECTION N/A 05/24/2022   Procedure: XI ROBOTIC ASSISTED LOWER ANTERIOR RESECTION;  Surgeon: Henrene Dodge, MD;  Location: [facility] ORS;  Service:  General;  Laterality: N/A;   Patient Active Problem List   Diagnosis Date Noted   Dyspnea 03/07/2023   [facility] spotted fever 03/07/2023   Strain of gastrocnemius tendon 03/07/2023   Lower abdominal pain 03/07/2023   Pain in pelvis 03/07/2023   Small bowel obstruction (HCC) 02/11/2023   Raynaud disease 02/11/2023   Polyarthralgia 02/11/2023   Edema 01/03/2023   Diverticular disease 12/10/2022   Hot flashes 12/10/2022   Pain of right lower extremity 12/10/2022   Paresthesia of lower extremity 12/10/2022   Primary insomnia 12/10/2022   Thyroid nodule 12/10/2022   Multiple joint pain 12/10/2022   S/P closure of ileostomy 08/24/2022   Ileostomy in place [facility])    Morphea 07/13/2022   Colonic stricture (HCC) 05/19/2022   Large bowel obstruction (HCC) 05/19/2022   Hypokalemia 05/18/2022   Abdominal pain 05/18/2022   Osteoarthritis of left knee 02/24/2022   Mixed connective tissue disease (HCC) 02/17/2022   Adhesive capsulitis of left shoulder 01/27/2022   Glenohumeral arthritis, left 01/27/2022   Microscopic hematuria 01/12/2022   Otorrhagia of right ear 12/10/2021   Elevated testosterone level in female 11/04/2021   Rash 10/13/2021   Hashimoto thyroiditis, 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   Hashimoto's disease 03/23/2021   Exposure to severe acute respiratory syndrome coronavirus 2 (SARS-CoV-2) 12/19/2020   Migraine without aura and without  status migrainosus, not intractable 08/19/2020   Myofascial pain 07/30/2020   Neck pain 07/30/2020   Upper back pain 07/30/2020   Hair loss 07/14/2020   Edema of lower extremity 07/11/2020   Gastroesophageal reflux disease 02/15/2020   Diarrhea 02/15/2020   Sleep disorder 01/16/2020   Hyperlipidemia 07/11/2019   Right shoulder pain 04/04/2019   Pre-operative clearance 12/13/2018   Endometrial cancer (HCC) 04/05/2018   Patellofemoral pain syndrome of right knee 03/20/2018   Gastroenteritis 12/07/2017   Fatigue 11/16/2017   Morbid  obesity (HCC) 08/11/2017   Stucco keratoses 08/11/2017   Essential hypertension 08/09/2017   History of endometrial cancer 08/09/2017   Lymphedema 08/09/2017   Menopause 08/09/2017   Malignant neoplastic disease (HCC) 04/07/2017   Benign hypertension 06/02/2016   Asthma 06/04/1969    PCP: Pennie Banter, MD  REFERRING PROVIDER: Pennie Banter, MD  REFERRING DIAG: I89.0   THERAPY DIAG:  Lymphedema, not elsewhere classified  Rationale for Evaluation and Treatment: Rehabilitation  ONSET DATE: 2013  (Cancer-related, endometrial 2013)  SUBJECTIVE:                                                                                                                                                                                           SUBJECTIVE STATEMENT:Elizabeth Martin presents to OT for lymphedema care to her RLE/RLQ. Pt presents without . Pt states she has been wrapped constantly since resuming OT for lymphedema care and, "it is really helping" Pt stated she and her spouse went on a  cruise this weekend and she even wrapped on the ship. Pt states LE related pain is unchanged.  PERTINENT HISTORY: Asthma, Autoimmune Disease, Endometrial Ca w/ LND ( 30 bilateral pelvic and periaortic LN), adjuvant chemotherapy and XRT,   H/O blood clots, RLE/RLQ lymphedema, Robotic assisted total hysterectomy w/ bilateral oophorectomy L Rotator Cuff repair, S/P ileostomy 2/2 colon stricture 05/2022 R hip arthroplasty scheduled for 11/11/23.  PAIN:  Are you having pain? Yes: NPRS scale: 5/10 Pain location: RLE, including hip Pain description: aching, heavy, full, tight Aggravating factors: standing, walking, extended dependent sitting Relieving factors: elevation, movement  PRECAUTIONS: Fall and Other: LYMPHEDEMA  WEIGHT BEARING RESTRICTIONS: Yes 5# lifting restriction- shoulder  FALLS:  Has patient fallen in last 6 months? No  LIVING ENVIRONMENT: Lives with: lives with their spouse Lives in:  House/apartment Stairs: No;  Has following equipment at home: Single point cane, Environmental consultant - 2 wheeled, Environmental consultant - 4 wheeled, and Wheelchair (manual)  OCCUPATION: retired Runner, broadcasting/film/video  LEISURE: gardening, hiking, biking, dancing- unable to participate in any of these due to pain and swelling  HAND DOMINANCE: right   PRIOR LEVEL OF  FUNCTION: Independent with basic ADLs, Independent with household mobility without device, Independent with community mobility without device, Requires assistive device for independence, Needs assistance with homemaking, Needs assistance with transfers, and Leisure: decreased  social participation for leisure pursuits due to impaired mobility and pain  PATIENT GOALS:  Be able to move freely without pain Reduce limb volume to be able to lift limb for basic daily ADLs and functional ambulation Reduce limb volume to increase ability to perform lower body dressing and bathing and grooming Reduce limb to increase body image  OBJECTIVE: Note: Objective measures were completed at Evaluation unless otherwise noted.  COGNITION:  Overall cognitive status: Within functional limits for tasks assessed   OBSERVATIONS / OTHER ASSESSMENTS:   POSTURE: WFL  LE ROM: WFL, but Limited mildly at R knee and ankle due to girth, skin approximation, and joint pain  LE MMT: WFL  Mild, Stage  II, Bilateral Lower Extremity Lymphedema 2/2 CVI and Obesity  Skin  Description Hyper-Keratosis Peau d' Orange Shiny Tight Fibrotic/ Indurated Fatty Doughy Spongy/ boggy   x x x x R>L   x   Skin dry Flaky WNL Macerated   mildly sclerotic     Color Redness Varicosities Blanching Hemosiderin Stain Mottled    x x   x   Odor Malodorous Yeast Fungal infection  WNL      x   Temperature Warm Cool wnl    x     Pitting Edema   1+ 2+ 3+ 4+ Non-pitting         x   Girth Symmetrical Asymmetrical                   Distribution    R>L RLE toes to groin    Stemmer Sign Positive Negative    +    Lymphorrhea History Of:  Present Absent     x    Wounds History Of Present Absent Venous Arterial Pressure Sheer     x        Signs of Infection Redness Warmth Erythema Acute Swelling Drainage Borders                    Sensation Light Touch Deep pressure Hypersensitivity   In tact Impaired In tact Impaired Absent Impaired   x  x  x     Nails WNL   Fungus nail dystrophy   x     Hair Growth Symmetrical Asymmetrical    R>L   Skin Creases Base of toes  Ankles   Base of Fingers knees       Abdominal pannus Thigh Lobules  Face/neck   x x  x        BLE COMPARATIVE LIMB VOLUMETRICS 10/10/23  LANDMARK RIGHT  10/10/23  R LEG (A-D) 5466.5 ml  R THIGH (E-G) 9186.6 ml  R FULL LIMB (A-G) 14653.1 ml  Limb Volume differential (LVD)  LVD for LEG = 44.1%, R>L LVD for THIGH= 33.98%, R>L LVD for FULL lower extremity 37.8%, R>L  Volume change since last 08/11/22 R LEG is INCREASED in volume by 59.6%. L THIGH is INCREASED by 38 %, and RLE full limb is INCREASED by 45.38%.  Volume change overall V  (Blank rows = not tested)  LANDMARK LEFT  10/12/23  L LEG (A-D) 3053.6 ml  L THIGH (E-G) 6064.8 ml  L  FULL LIMB (A-G) 9118.4 ml  Limb Volume differential (LVD)  %  Volume change since initial %  Volume change  overall %  (Blank rows = not tested)   CA-related LYMPHEDEMA Hx:  SURGERY TYPE/DATE: 2013 NUMBER OF LYMPH NODES REMOVED: 30 by report- bilateral pelvic and periaortic CHEMOTHERAPY: yes RADIATION:yes INFECTIONS: no hx cellulitis GAIT: Distance walked: >500 ft Assistive device utilized: None Level of assistance: Modified independence- extra time Comments: R limp  LYMPHEDEMA LIFE IMPACT SCALE (LLIS): Intake 10/12/23 75% (The extent to which lymphedema related problems impacted your life over the past week)  FOTO (functional outcome measure):10/10/23 INTAKE: 36%  PATIENT EDUCATION:  Education details: Continued Pt/ CG edu for lymphedema self care home program  throughout session. Topics include outcome of comparative limb volumetrics- starting limb volume differentials (LVDs), technology and gradient techniques used for short stretch, multilayer compression wrapping, simple self-MLD, therapeutic lymphatic pumping exercises, skin/nail care, LE precautions,. compression garment recommendations and specifications, wear and care schedule and compression garment donning / doffing w assistive devices. Discussed progress towards all OT goals since commencing CDT. All questions answered to the Pt's satisfaction. Good return. Person educated: Patient Education method: Explanation, Demonstration, and Handouts Education comprehension: verbalized understanding, returned demonstration, and needs further education  LE SELF-CARE HOME  PROGRAM: Simple self-MLD/daily to affected quadrant and body part At least 2 x daily BLE Lymphatic Pumping There ex 1 set of 10 reps each, in order, bilaterally Daily skin care to affected body part to limit infection risk and increase skin excursion Compression Bandaging Intensive stage compression: multilayer short stretch wraps with gradient techniques. One limb at a time. Length patient dependent. Self-management Phase: Appropriate daytime compression garment and hours-of-sleep device Compression garments: Custom-made gradient compression garments and hours-of-sleep devices are medically necessary because they are uniquely sized and shaped to fit the exact dimensions of the affected extremities and to provide accurate and consistent gradient compression and containment, essential  for optimally managing chronic, progressive lymphedema. The convoluted HOS devices are medically necessary to facilitate increased lymphatic circulation and limit fibrosis formation when sleeping. Multiple custom compression garments are needed for optimal hygiene to limit infection risk. Custom compression garments should be replaced q 3-6 months When worn  consistently for optimal lipo-lymphedema self-management over time.  Pt will benefit from [id number]- Circaid reduction kit whole leg to increase independence with lymphedema self care, to  reduce limb volume  for body symmetry and balance, and to limit infection risk and progression.  ASSESSMENT:  CLINICAL IMPRESSION: Continued RLE/RLQ MLD today, utilizing IA pathway. Emphasis on fibrosis on inguinal area of anterior thigh. Volume reduction and tissue density softening is  obvious since commencing CDT with Pt compliance with compression wraps between sessions. Reapplied full limb  multi limb gradient compression wraps as established.  Cont as per POC.   10/10/23 Lymphedema Episode 2, Initial OT Evaluation : Elizabeth Martin is a 79 y a female presenting with chronic, progressive, moderate, Stage II, RLE/RLQ cancer -related lymphedema with onset 11 years ago after Rx for endometrial cancer. Pt is well known to this therapist as she has successfully undergone Complete Decongestive Therapy (CDT)  this clinic bin the past. Pt reports recent exacerbation of RLE swelling worsened as inflammation in L hip has worsened over time.  Pt has a hip replacement scheduled in late December here it [facility]. RE limb volumetrics today reveal that R LEG volume is dramatically increased by 59.65 since last visit. R LEG VOLUME is INCREASED in volume by 59.6%. L THIGH is INCREASED by 38 %, and RLE full limb is INCREASED by 45.38%. Pt presents with LE-related skin changes. Prolonged inflammation in the  R hip joint has likely overloaded  already RLE/RLQ lymphatics.  RLE/RLQ lymphedema limits Pt's ability to perform basic and instrumental ADLs, including functional ambulation, mobility and transfers, grooming , lower body bathing and dressing, skin inspection and skin care. Pt has difficulty  reaching her lower legs and feet to apply compression wraps and don/doff        compression stockings. LE limits ability to P[perform instrumental  ADLs, including driving, yard work and home management activities. Lymphedema limits her ability to participate in leisure pursuits and productive activities, and it negatively impacts body image, life roles and quality of life.   Pt will benefit from an Intensive and follow along course of lymphedema. CDT will consist of Manual Lymphatic gainage   (MLD), skin care, therapeutic exercise and compression, wraps initially then garments. Pt will need assistance throughout CDT   for applying compression wraps due to limited hip AROM and decreased skin flexibility. Without skilled Occupational Therapy for lymphedema care, lymphedema will progress and further functional decline is expected.   In prep for upcoming hip replacement OT will educate Pt re assistive devices, including tub transfer bench and elevated toilet seat, in keeping with hip precautions.  OBJECTIVE IMPAIRMENTS: Abnormal gait, decreased activity tolerance, decreased balance, decreased knowledge of use of DME, decreased mobility, difficulty walking, decreased ROM, decreased strength, increased edema, decreased skin flexibility, increased fascial restrictions, impaired sensation, pain, and chronic, progressive LLE/LLQ lymphatic swelling and associated pain.   ADL LIMITATIONS: carrying, lifting, bending, sitting, standing, squatting, sleeping, stairs, transfers, bed mobility, bathing, dressing, hygiene/grooming, and productive activities, leisure pursuits, social participation, body image  driving, shopping, housework, yard work, cooking, meal prep  PERSONAL FACTORS: Past/current experiences and 3+ comorbidities: Morphea Scleroderma, Mixed connective tissue disease, and osteoarthritis  are also affecting patient's functional outcome.   REHAB POTENTIAL: Good  EVALUATION COMPLEXITY: Moderate  GOALS: Goals reviewed with patient? Yes  SHORT TERM GOALS: Target date: 4th OT Rx visit   Pt will demonstrate understanding of lymphedema precautions  and prevention strategies with modified independence using a printed reference to identify at least 5 precautions and discussing how s/he may implement them into daily life to reduce risk of progression with extra time.  Baseline:Max A Goal status: INITIAL  2.  Pt will be able to apply multilayer, knee length, gradient, compression wraps to one leg at a time with modified assistance (extra time and assistive device/s) to decrease limb volume, to limit infection risk, and to limit lymphedema progression.  Baseline: Dependent Goal status: INITIAL  LONG TERM GOALS: Target date: 01/09/24 (12 weeks)  Given this patient's Intake score 36% on the functional outcomes FOTO tool, patient will experience an increase in function of 3 points to improve basic and instrumental ADLs performance, including lymphedema self-care.  Baseline: Max A Goal status: INITIAL  2.  Given this patient's Intake score of  75% on the Lymphedema Life Impact Scale (LLIS), patient will experience a reduction of at least 5 points in her perceived level of functional impairment resulting from lymphedema to improve functional performance and quality of life (QOL). Baseline: % Goal status: INITIAL  3.  Pt will achieve at least a 10% volume reduction in full, RLE limb volume to return limb to typical size and shape, to limit infection risk and LE progression, to decrease pain, to improve function. Baseline: Dependent Goal status: INITIAL  4.  Pt will obtain proper compression garments/devices and achieve modified independence (extra time + assistive devices) with donning/doffing to optimize limb volume reductions  and limit LE  progression over time. Baseline:  Goal status: INITIAL  5.  During Intensive phase CDT , with modified independence, Pt will achieve at least 85% compliance with all lymphedema self-care home program components, including daily skin care, compression wraps and /or garments, simple self MLD and lymphatic pumping  therex to habituate LE self care protocol  into ADLs for optimal LE self-management over time. Baseline: Dependent Goal status: INITIAL PLAN:  OT FREQUENCY: 2x/week  OT DURATION: 12 weeks and PRN  PLANNED INTERVENTIONS: Complete Decongestive Therapy (Intensive and supported Self-Management Phases), 97110-Therapeutic exercises, 97530- Therapeutic activity, 97535- Self Care, 16109- Manual therapy, Patient/Family education, Taping, Manual lymph drainage, Scar mobilization, Compression bandaging, DME instructions, and skin care to reduce infection risk throughout manual therapy. Fit with replacement compression garments ASAP  PLAN FOR NEXT SESSION:  Commence full limb multilayer compression wrapping using gradient bandaging techniques Pt and family edu for LE self-care home program- Bandaging   Loel Dubonnet, MS, OTR/L, CLT-LANA 11/01/23 7:16 PM  11/01/2023, 7:16 PM

## 2023-11-01 NOTE — Patient Instructions (Signed)
Your procedure is scheduled on: Monday 11/14/23 To find out your arrival time, please call [phone] between 1PM - 3PM on:   Friday 11/11/23 Report to the Registration Desk on the 1st floor of the [facility]. Free Valet parking is available.  If your arrival time is 6:00 am, do not arrive before that time as the [facility] entrance doors do not open until 6:00 am.  REMEMBER: Instructions that are not followed completely may result in serious medical risk, up to and including death; or upon the discretion of your surgeon and anesthesiologist your surgery may need to be rescheduled.  Do not eat food after midnight the night before surgery.  No gum chewing or hard candies.  You may however, drink CLEAR liquids up to 2 hours before you are scheduled to arrive for your surgery. Do not drink anything within 2 hours of your scheduled arrival time.  Clear liquids include: - water  - apple juice without pulp - gatorade (not RED colors) - black coffee or tea (Do NOT add milk or creamers to the coffee or tea) Do NOT drink anything that is not on this list.  Type 1 and Type 2 diabetics should only drink water.  In addition, your doctor has ordered for you to drink the provided:  Ensure Pre-Surgery Clear Carbohydrate Drink  Drinking this carbohydrate drink up to two hours before surgery helps to reduce insulin resistance and improve patient outcomes. Please complete drinking 2 hours before scheduled arrival time.  One week prior to surgery: Stop Anti-inflammatories (NSAIDS) such as Advil, Aleve, Ibuprofen, Motrin, Naproxen, Naprosyn and Aspirin based products such as Excedrin, Goody's Powder, BC Powder. You may however, continue to take Tylenol if needed for pain up until the day of surgery.  Stop ANY OVER THE COUNTER for 7 days supplements until after surgery.  Continue taking all prescribed medications.  TAKE ONLY THESE MEDICATIONS THE MORNING OF SURGERY WITH A SIP OF  WATER:  busPIRone (BUSPAR) 10 MG tablet  DULoxetine (CYMBALTA) 60 MG capsule  3.   omeprazole (PRILOSEC) 40 MG capsule Antacid (take one the night before and one on the morning of surgery - helps to prevent nausea after surgery.) 4.   cetirizine (ZYRTEC) 10 MG tablet   Use inhalers on the day of surgery and bring to the hospital.  No Alcohol for 24 hours before or after surgery.  No Smoking including e-cigarettes for 24 hours before surgery.  No chewable tobacco products for at least 6 hours before surgery.  No nicotine patches on the day of surgery.  Do not use any "recreational" drugs for at least a week (preferably 2 weeks) before your surgery.  Please be advised that the combination of cocaine and anesthesia may have negative outcomes, up to and including death. If you test positive for cocaine, your surgery will be cancelled.  On the morning of surgery brush your teeth with toothpaste and water, you may rinse your mouth with mouthwash if you wish. Do not swallow any toothpaste or mouthwash.  Use CHG Soap or wipes as directed on instruction sheet. Shower daily for 5 consecutive with CHG soap beginning on Thursday 11/10/23 and ending on Monday 11/14/23  Do not wear lotions, powders, or perfumes on the day of your surgery   Do not shave body hair from the neck down 48 hours before surgery.  Wear clean comfortable clothing (specific to your surgery type) to the hospital.  Do not wear jewelry, make-up, hairpins, clips or nail polish.  For welded (permanent) jewelry: bracelets, anklets, waist bands, etc.  Please have this removed prior to surgery.  If it is not removed, there is a chance that hospital personnel will need to cut it off on the day of surgery. Contact lenses, hearing aids and dentures may not be worn into surgery.  Do not bring valuables to the hospital. [facility] is not responsible for any missing/lost belongings or valuables.   Notify your doctor if there is any  change in your medical condition (cold, fever, infection).  If you are being discharged the day of surgery, you will not be allowed to drive home. You will need a responsible individual to drive you home and stay with you for 24 hours after surgery.   If you are taking public transportation, you will need to have a responsible individual with you.  If you are being admitted to the hospital overnight, leave your suitcase in the car. After surgery it may be brought to your room.  In case of increased patient census, it may be necessary for you, the patient, to continue your postoperative care in the Same Day Surgery department.  After surgery, you can help prevent lung complications by doing breathing exercises.  Take deep breaths and cough every 1-2 hours. Your doctor may order a device called an Incentive Spirometer to help you take deep breaths. When coughing or sneezing, hold a pillow firmly against your incision with both hands. This is called "splinting." Doing this helps protect your incision. It also decreases belly discomfort.  Surgery Visitation Policy:  Patients undergoing a surgery or procedure may have two family members or support persons with them as long as the person is not COVID-19 positive or experiencing its symptoms.   Inpatient Visitation:    Visiting hours are 7 a.m. to 8 p.m. Up to four visitors are allowed at one time in a patient room. The visitors may rotate out with other people during the day. One designated support person (adult) may remain overnight.  Please call the Pre-admissions Testing Dept. at [phone] if you have any questions about these instructions.    Pre-operative 5 CHG Bath Instructions   You can play a key role in reducing the risk of infection after surgery. Your skin needs to be as free of germs as possible. You can reduce the number of germs on your skin by washing with CHG (chlorhexidine gluconate) soap before surgery. CHG is an  antiseptic soap that kills germs and continues to kill germs even after washing.   DO NOT use if you have an allergy to chlorhexidine/CHG or antibacterial soaps. If your skin becomes reddened or irritated, stop using the CHG and notify one of our RNs at [phone].   Please shower with the CHG soap starting 4 days before surgery using the following schedule:     Please keep in mind the following:  DO NOT shave, including legs and underarms, starting the day of your first shower.   You may shave your face at any point before/day of surgery.  Place clean sheets on your bed the day you start using CHG soap. Use a clean washcloth (not used since being washed) for each shower. DO NOT sleep with pets once you start using the CHG.   CHG Shower Instructions:  If you choose to wash your hair and private area, wash first with your normal shampoo/soap.  After you use shampoo/soap, rinse your hair and body thoroughly to remove shampoo/soap residue.  Turn the water  OFF and apply about 3 tablespoons (45 ml) of CHG soap to a CLEAN washcloth.  Apply CHG soap ONLY FROM YOUR NECK DOWN TO YOUR TOES (washing for 3-5 minutes)  DO NOT use CHG soap on face, private areas, open wounds, or sores.  Pay special attention to the area where your surgery is being performed.  If you are having back surgery, having someone wash your back for you may be helpful. Wait 2 minutes after CHG soap is applied, then you may rinse off the CHG soap.  Pat dry with a clean towel  Put on clean clothes/pajamas   If you choose to wear lotion, please use ONLY the CHG-compatible lotions on the back of this paper.     Additional instructions for the day of surgery: DO NOT APPLY any lotions, deodorants, cologne, or perfumes.   Put on clean/comfortable clothes.  Brush your teeth.  Ask your nurse before applying any prescription medications to the skin.      CHG Compatible Lotions   Aveeno Moisturizing lotion  Cetaphil  Moisturizing Cream  Cetaphil Moisturizing Lotion  Clairol Herbal Essence Moisturizing Lotion, Dry Skin  Clairol Herbal Essence Moisturizing Lotion, Extra Dry Skin  Clairol Herbal Essence Moisturizing Lotion, Normal Skin  Curel Age Defying Therapeutic Moisturizing Lotion with Alpha Hydroxy  Curel Extreme Care Body Lotion  Curel Soothing Hands Moisturizing Hand Lotion  Curel Therapeutic Moisturizing Cream, Fragrance-Free  Curel Therapeutic Moisturizing Lotion, Fragrance-Free  Curel Therapeutic Moisturizing Lotion, Original Formula  Eucerin Daily Replenishing Lotion  Eucerin Dry Skin Therapy Plus Alpha Hydroxy Crme  Eucerin Dry Skin Therapy Plus Alpha Hydroxy Lotion  Eucerin Original Crme  Eucerin Original Lotion  Eucerin Plus Crme Eucerin Plus Lotion  Eucerin TriLipid Replenishing Lotion  Keri Anti-Bacterial Hand Lotion  Keri Deep Conditioning Original Lotion Dry Skin Formula Softly Scented  Keri Deep Conditioning Original Lotion, Fragrance Free Sensitive Skin Formula  Keri Lotion Fast Absorbing Fragrance Free Sensitive Skin Formula  Keri Lotion Fast Absorbing Softly Scented Dry Skin Formula  Keri Original Lotion  Keri Skin Renewal Lotion Keri Silky Smooth Lotion  Keri Silky Smooth Sensitive Skin Lotion  Nivea Body Creamy Conditioning Oil  Nivea Body Extra Enriched Teacher, adult education Moisturizing Lotion Nivea Crme  Nivea Skin Firming Lotion  NutraDerm 30 Skin Lotion  NutraDerm Skin Lotion  NutraDerm Therapeutic Skin Cream  NutraDerm Therapeutic Skin Lotion  ProShield Protective Hand Cream  Provon moisturizing lotion

## 2023-11-07 ENCOUNTER — Telehealth (INDEPENDENT_AMBULATORY_CARE_PROVIDER_SITE_OTHER): Payer: Self-pay | Admitting: Nurse Practitioner

## 2023-11-07 NOTE — Telephone Encounter (Signed)
Has hip replacement surgery on 11/14/23. Pt states Dr Audelia Acton would like her to see someone before surgery in [facility] to give recommendations RE: post operative care to avoid a blood clot. NEW patient, no available slot for new patient before surgery. Please advise.

## 2023-11-07 NOTE — Telephone Encounter (Signed)
You can see if she is available to see me this morning or tomorrow, she does not need studies

## 2023-11-08 ENCOUNTER — Ambulatory Visit (INDEPENDENT_AMBULATORY_CARE_PROVIDER_SITE_OTHER): Payer: BC Managed Care – PPO | Admitting: Nurse Practitioner

## 2023-11-08 ENCOUNTER — Encounter (INDEPENDENT_AMBULATORY_CARE_PROVIDER_SITE_OTHER): Payer: Self-pay | Admitting: Nurse Practitioner

## 2023-11-08 ENCOUNTER — Telehealth (INDEPENDENT_AMBULATORY_CARE_PROVIDER_SITE_OTHER): Payer: Self-pay

## 2023-11-08 VITALS — BP 122/76 | HR 90 | Resp 18 | Ht 67.0 in | Wt 179.4 lb

## 2023-11-08 DIAGNOSIS — I824Y9 Acute embolism and thrombosis of unspecified deep veins of unspecified proximal lower extremity: Secondary | ICD-10-CM

## 2023-11-08 DIAGNOSIS — I10 Essential (primary) hypertension: Secondary | ICD-10-CM | POA: Diagnosis not present

## 2023-11-08 DIAGNOSIS — M2550 Pain in unspecified joint: Secondary | ICD-10-CM | POA: Diagnosis not present

## 2023-11-08 NOTE — Telephone Encounter (Signed)
I attempted to contact the patient to schedule for a IVC filter placement with Dr. Wyn Quaker on 11/09/23. Patient called back and will check in at the [facility] for a IVC filter placement at 12:30 pm. Pre-procedure instructions were discussed and will be sent to Mychart.

## 2023-11-09 ENCOUNTER — Encounter: Payer: Self-pay | Admitting: Vascular Surgery

## 2023-11-09 ENCOUNTER — Other Ambulatory Visit: Payer: Self-pay

## 2023-11-09 ENCOUNTER — Encounter
Admission: RE | Disposition: A | Discharge status: Home / Self Care | Payer: Self-pay | Source: Ambulatory Visit | Attending: Vascular Surgery

## 2023-11-09 ENCOUNTER — Ambulatory Visit
Admission: RE | Admit: 2023-11-09 | Discharge: 2023-11-09 | Disposition: A | Discharge status: Home / Self Care | Payer: BC Managed Care – PPO | Source: Ambulatory Visit | Attending: Vascular Surgery | Admitting: Vascular Surgery

## 2023-11-09 DIAGNOSIS — I82409 Acute embolism and thrombosis of unspecified deep veins of unspecified lower extremity: Secondary | ICD-10-CM | POA: Insufficient documentation

## 2023-11-09 DIAGNOSIS — I10 Essential (primary) hypertension: Secondary | ICD-10-CM | POA: Diagnosis not present

## 2023-11-09 DIAGNOSIS — M549 Dorsalgia, unspecified: Secondary | ICD-10-CM

## 2023-11-09 DIAGNOSIS — Z408 Encounter for other prophylactic surgery: Secondary | ICD-10-CM

## 2023-11-09 DIAGNOSIS — Z86711 Personal history of pulmonary embolism: Secondary | ICD-10-CM | POA: Insufficient documentation

## 2023-11-09 DIAGNOSIS — Z86718 Personal history of other venous thrombosis and embolism: Secondary | ICD-10-CM | POA: Diagnosis not present

## 2023-11-09 DIAGNOSIS — M2550 Pain in unspecified joint: Secondary | ICD-10-CM | POA: Diagnosis not present

## 2023-11-09 DIAGNOSIS — I890 Lymphedema, not elsewhere classified: Secondary | ICD-10-CM | POA: Insufficient documentation

## 2023-11-09 HISTORY — PX: IVC FILTER INSERTION: CATH118245

## 2023-11-09 LAB — CREATININE, SERUM
Creatinine, Ser: 0.77 mg/dL (ref 0.44–1.00)
GFR, Estimated: >60 mL/min (ref >60)

## 2023-11-09 LAB — BUN: BUN: 8 mg/dL (ref 8–23)

## 2023-11-09 SURGERY — IVC FILTER INSERTION
Anesthesia: Moderate Sedation

## 2023-11-09 MED ORDER — METHYLPREDNISOLONE SODIUM SUCC 125 MG IJ SOLR: 125.0000 mg | Freq: Once | INTRAMUSCULAR | Status: DC | PRN

## 2023-11-09 MED ORDER — FENTANYL CITRATE (PF) 100 MCG/2ML IJ SOLN
INTRAMUSCULAR | Status: AC
  Filled 2023-11-09: qty 2

## 2023-11-09 MED ORDER — HEPARIN SODIUM (PORCINE) 1000 UNIT/ML IJ SOLN
INTRAMUSCULAR | Status: AC
  Filled 2023-11-09: qty 10

## 2023-11-09 MED ORDER — SODIUM CHLORIDE 0.9 % IV SOLN: INTRAVENOUS | Status: DC

## 2023-11-09 MED ORDER — MIDAZOLAM HCL 2 MG/2ML IJ SOLN
INTRAMUSCULAR | Status: DC | PRN
  Administered 2023-11-09: .5 mg via INTRAVENOUS
  Administered 2023-11-09: 1 mg via INTRAVENOUS

## 2023-11-09 MED ORDER — HEPARIN (PORCINE) IN NACL 1000-0.9 UT/500ML-% IV SOLN
INTRAVENOUS | Status: DC | PRN
  Administered 2023-11-09: 500 mL

## 2023-11-09 MED ORDER — FAMOTIDINE 20 MG PO TABS: 40.0000 mg | ORAL_TABLET | Freq: Once | ORAL | Status: DC | PRN

## 2023-11-09 MED ORDER — MIDAZOLAM HCL 2 MG/ML PO SYRP: 8.0000 mg | ORAL_SOLUTION | Freq: Once | ORAL | Status: DC | PRN

## 2023-11-09 MED ORDER — HYDROMORPHONE HCL 1 MG/ML IJ SOLN: 0.5000 mg | Freq: Once | INTRAMUSCULAR | Status: DC | PRN

## 2023-11-09 MED ORDER — IODIXANOL 320 MG/ML IV SOLN
INTRAVENOUS | Status: DC | PRN
  Administered 2023-11-09: 15 mL

## 2023-11-09 MED ORDER — FENTANYL CITRATE (PF) 100 MCG/2ML IJ SOLN
INTRAMUSCULAR | Status: DC | PRN
  Administered 2023-11-09: 12.5 ug via INTRAVENOUS
  Administered 2023-11-09: 50 ug via INTRAVENOUS

## 2023-11-09 MED ORDER — CEFAZOLIN SODIUM-DEXTROSE 2-4 GM/100ML-% IV SOLN
2.0000 g | INTRAVENOUS | Status: AC
  Administered 2023-11-09: 2 g via INTRAVENOUS

## 2023-11-09 MED ORDER — MIDAZOLAM HCL 2 MG/2ML IJ SOLN
INTRAMUSCULAR | Status: AC
Start: 2023-11-09 — End: ?
  Filled 2023-11-09: qty 4

## 2023-11-09 MED ORDER — LIDOCAINE-EPINEPHRINE (PF) 1 %-1:200000 IJ SOLN
INTRAMUSCULAR | Status: DC | PRN
  Administered 2023-11-09: 10 mL

## 2023-11-09 MED ORDER — ONDANSETRON HCL 4 MG/2ML IJ SOLN: 4.0000 mg | Freq: Four times a day (QID) | INTRAMUSCULAR | Status: DC | PRN

## 2023-11-09 MED ORDER — CEFAZOLIN SODIUM-DEXTROSE 2-4 GM/100ML-% IV SOLN
INTRAVENOUS | Status: AC
Start: 2023-11-09 — End: ?
  Filled 2023-11-09: qty 100

## 2023-11-09 MED ORDER — MORPHINE SULFATE (PF) 4 MG/ML IV SOLN: 0.5000 mg | Freq: Once | INTRAVENOUS | Status: DC | PRN

## 2023-11-09 MED ORDER — DIPHENHYDRAMINE HCL 50 MG/ML IJ SOLN: 50.0000 mg | Freq: Once | INTRAMUSCULAR | Status: DC | PRN

## 2023-11-09 SURGICAL SUPPLY — 4 items
COVER PROBE ULTRASOUND 5X96 (MISCELLANEOUS) IMPLANT
KIT FEM OPTION ELITE FILTER (Filter) IMPLANT
PACK ANGIOGRAPHY (CUSTOM PROCEDURE TRAY) ×1 IMPLANT
WIRE SUPRACORE 190CM (WIRE) IMPLANT

## 2023-11-09 NOTE — H&P (View-Only) (Signed)
Subjective:    Patient ID: Elizabeth Martin, female    DOB: [birth year], 62 y.o.   MRN: [id number] Chief Complaint  Patient presents with   New Patient (Initial Visit)    np. consult. no studies. eval for IVC placement prior to hip replacement. aberman, zachary. 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  DVT was identified years ago and was treated with anticoagulation.  The presenting symptoms were pain and swelling in the lower extremity.  The patient has had multiple DVTs with multiple different procedures.  She has a history of autoimmune disorder.  She also has marked lymphedema of the right lower extremity.  She currently works with lymphedema clinic to maintain control but she has a very good regimen currently.  No SOB or pleuritic chest pains.  No cough or hemoptysis.  The patient is currently not  on anticoagulation. No blood per rectum or blood in any sputum.  No excessive bruising per the patient.        Review of Systems  Cardiovascular:  Positive for leg swelling.  All other systems reviewed and are negative.      Objective:   Physical Exam Vitals reviewed.  HENT:     Head: Normocephalic.  Cardiovascular:     Rate and Rhythm: Normal rate.     Pulses: Normal pulses.  Pulmonary:     Effort: Pulmonary effort is normal.  Musculoskeletal:     Right lower leg: Edema present.  Skin:    General: Skin is warm and dry.  Neurological:     Mental Status: She is alert and oriented to person, place, and time.  Psychiatric:        Mood and Affect: Mood normal.        Behavior: Behavior normal.        Thought Content: Thought content normal.        Judgment: Judgment normal.     BP 122/76 (BP Location: Right Arm)   Pulse 90   Resp 18   Ht 5\' 7"  (1.702 m)   Wt 179 lb 6.4 oz (81.4 kg)   BMI 28.10 kg/m   Past Medical History:  Diagnosis Date   Anxiety    Asthma     Autoimmune disease (HCC)    Cancer (HCC)    Endometrial lymph node removal (30)   Corneal abrasion, right 05/24/2022   Depression    GERD (gastroesophageal reflux disease)    H/O blood clots    upper rt groin and behind both knees   History of hiatal hernia    Hypertension    Lymphedema    right leg   Morphea scleroderma    PONV (postoperative nausea and vomiting)    states gets violently ill    Social History   Socioeconomic History   Marital status: Married    Spouse name: Not on file   Number of children: Not on file   Years of education: Not on file   Highest education level: Not on file  Occupational History   Not on file  Tobacco Use   Smoking status: Never   Smokeless tobacco: Never  Vaping Use   Vaping status: Never Used  Substance and Sexual Activity   Alcohol use: Yes    Comment: occ.   Drug use: Never   Sexual activity: Not Currently  Other Topics Concern   Not on file  Social  History Narrative   ** Merged History Encounter **       Social Determinants of Health   Financial Resource Strain: Low Risk  (08/20/2023)   Received from [facility]   Overall Financial Resource Strain (CARDIA)    Difficulty of Paying Living Expenses: Not hard at all  Food Insecurity: No Food Insecurity (08/20/2023)   Received from [facility]   Hunger Vital Sign    Worried About Running Out of Food in the Last Year: Never true    Ran Out of Food in the Last Year: Never true  Transportation Needs: No Transportation Needs (08/20/2023)   Received from [facility]   PRAPARE - Transportation    Lack of Transportation (Medical): No    Lack of Transportation (Non-Medical): No  Physical Activity: Not on file  Stress: Not on file  Social Connections: Unknown (02/01/2023)   [facility] from [facility]   Social Network    Social Network: Not on file  Intimate Partner Violence: Not At Risk (08/20/2023)   Received from [facility]   Humiliation,  Afraid, Rape, and Kick questionnaire    Fear of Current or Ex-Partner: No    Emotionally Abused: No    Physically Abused: No    Sexually Abused: No    Past Surgical History:  Procedure Laterality Date   BASAL CELL CARCINOMA EXCISION Left    CHOLECYSTECTOMY  2008   FLEXIBLE SIGMOIDOSCOPY N/A 05/19/2022   Procedure: FLEXIBLE SIGMOIDOSCOPY;  Surgeon: Toledo, Boykin Nearing, MD;  Location: [facility] ENDOSCOPY;  Service: Gastroenterology;  Laterality: N/A;   HERNIA REPAIR  2004   ILEOSTOMY CLOSURE N/A 08/24/2022   Procedure: ILEOSTOMY TAKEDOWN, open loop with Lynden Oxford, PA-C to assist;  Surgeon: Henrene Dodge, MD;  Location: [facility] ORS;  Service: General;  Laterality: N/A;   PLANTAR FASCIECTOMY     ROBOTIC ASSISTED TOTAL HYSTERECTOMY WITH BILATERAL SALPINGO OOPHERECTOMY  02/14/2012   ROTATOR CUFF REPAIR Left 03/24/2022   TONSILLECTOMY     XI ROBOTIC ASSISTED LOWER ANTERIOR RESECTION N/A 05/24/2022   Procedure: XI ROBOTIC ASSISTED LOWER ANTERIOR RESECTION;  Surgeon: Henrene Dodge, MD;  Location: [facility] ORS;  Service: General;  Laterality: N/A;    History reviewed. No pertinent family history.  Allergies  Allergen Reactions   Carboplatin Anaphylaxis        Hydromorphone Anaphylaxis    Stopped breathing  Respiratory arrest - due to dosage administered per the patient.   Latex Hives, Itching and Rash   Nitrofurantoin Nausea And Vomiting   Silicone Itching and Rash   Wheat Other (See Comments)    Body aches and swelling    Fentanyl Hives, Itching and Rash       Latest Ref Rng & Units 11/01/2023   11:10 AM 02/12/2023    6:24 AM 02/11/2023   11:01 AM  CBC  WBC 4.0 - 10.5 K/uL 8.7  8.9  11.5   Hemoglobin 12.0 - 15.0 g/dL 78.2  95.6  21.3   Hematocrit 36.0 - 46.0 % 37.4  36.3  42.2   Platelets 150 - 400 K/uL 366  279  342       CMP     Component Value Date/Time   NA 135 11/01/2023 1110   K 3.9 11/01/2023 1110   CL 102 11/01/2023 1110   CO2 25 11/01/2023 1110   GLUCOSE 100 (H)  11/01/2023 1110   BUN 10 11/01/2023 1110   CREATININE 0.56 11/01/2023 1110   CALCIUM 8.6 (L) 11/01/2023  1110   PROT 6.1 (L) 11/01/2023 1110   ALBUMIN 3.8 11/01/2023 1110   AST 18 11/01/2023 1110   ALT 19 11/01/2023 1110   ALKPHOS 57 11/01/2023 1110   BILITOT 0.6 11/01/2023 1110   GFRNONAA >60 11/01/2023 1110     No results found.     Assessment & Plan:   1. Deep vein thrombosis (DVT) of proximal lower extremity, unspecified chronicity, unspecified laterality (HCC)   IVC filter is strongly indicated prior to high risk orthopedic surgery.  Especially given the history of PE / DVT.  IVC filter placement will be done the week for surgery. Risk and benefits were reviewed the patient.  Indications for the procedure were reviewed.  All questions were answered, the patient agrees to proceed.    Extensive history of lymphedema.  She will continue with conservative therapy treatments The patient will follow-up with me 6-8 weeks after the joint replacement surgery to discuss removal (this was also discussed today and the patient agrees with the plan to have the filter removed).   2. Benign hypertension Continue antihypertensive medications as already ordered, these medications have been reviewed and there are no changes at this time.  3. Polyarthralgia Patient has upcoming hip surgery scheduled on 11/14/2023   Current Outpatient Medications on File Prior to Visit  Medication Sig Dispense Refill   albuterol (VENTOLIN HFA) 108 (90 Base) MCG/ACT inhaler Inhale 2 puffs into the lungs every 6 (six) hours as needed for shortness of breath or wheezing.     Alum Hydroxide-Mag Carbonate (GAVISCON PO) Take 4 tablets by mouth daily as needed (upset stomach).     busPIRone (BUSPAR) 10 MG tablet Take 10 mg by mouth 2 (two) times daily.     celecoxib (CELEBREX) 200 MG capsule Take 200 mg by mouth 2 (two) times daily.     cetirizine (ZYRTEC) 10 MG tablet Take 10 mg by mouth daily as needed for  allergies.     desonide (DESOWEN) 0.05 % cream Apply 1 Application topically 2 (two) times daily as needed (morphea flare).     DULoxetine (CYMBALTA) 30 MG capsule Take 30-60 mg by mouth See admin instructions. Take 60 mg in the morning and 30 mg midday     famotidine-calcium carbonate-magnesium hydroxide (PEPCID COMPLETE) 10-800-165 MG chewable tablet Chew 1 tablet by mouth daily as needed (heartburn).     folic acid (FOLVITE) 1 MG tablet Take 1 mg by mouth daily.     hydrocortisone-pramoxine (ANALPRAM-HC) 2.5-1 % rectal cream Place 1 Application rectally 3 (three) times daily as needed for hemorrhoids.     hydroxychloroquine (PLAQUENIL) 200 MG tablet Take 200 mg by mouth 2 (two) times daily.     lactulose (CHRONULAC) 10 GM/15ML solution Take 3 mLs by mouth 2 (two) times daily.     lisinopril (ZESTRIL) 40 MG tablet Take 40 mg by mouth daily.     methotrexate (RHEUMATREX) 2.5 MG tablet Take 3 tablets by mouth 2 (two) times a week. Take 7.5 mg on Mondays and Tuesdays     Multiple Vitamin (MULTIVITAMIN) capsule Take 1 capsule by mouth daily.     omeprazole (PRILOSEC) 40 MG capsule Take 40 mg by mouth daily.     ondansetron (ZOFRAN-ODT) 8 MG disintegrating tablet Take 8 mg by mouth every 8 (eight) hours as needed for vomiting or nausea.     triamcinolone (KENALOG) 0.025 % cream Apply 1 Application topically 2 (two) times daily as needed (morphea flare).     trospium (SANCTURA) 20 MG  tablet Take 1 tablet (20 mg total) by mouth 2 (two) times daily. (Patient taking differently: Take 20 mg by mouth 2 (two) times daily as needed (overactive bladder).) 60 tablet 3   No current facility-administered medications on file prior to visit.    There are no Patient Instructions on file for this visit. No follow-ups on file.   Georgiana Spinner, NP

## 2023-11-09 NOTE — Op Note (Signed)
[facility]   OPERATIVE NOTE    PRE-OPERATIVE DIAGNOSIS: history of DVT, upcoming major orthopedic surgery  POST-OPERATIVE DIAGNOSIS: same as above  PROCEDURE: 1.   Ultrasound guidance for vascular access to the right vein 2.   Catheter placement into the inferior vena cava 3.   Inferior venacavogram 4.   Placement of a Option Elite IVC filter  SURGEON: Festus Barren, MD  ASSISTANT(S): None  ANESTHESIA: local with Moderate Conscious Sedation for approximately 13 minutes using 1.5 mg of Versed and 62.5 mcg of Fentanyl  ESTIMATED BLOOD LOSS: minimal  CONTRAST: 15 cc  FLUORO TIME: less than one minute  FINDING(S): 1.  Patent IVC  SPECIMEN(S):  none  INDICATIONS:   Elizabeth Martin is a 62 y.o. female who presents with strong history of previous DVT and an upcoming major orthopedic surgery putting her at high risk for thromboembolic complications.  Inferior vena cava filter is indicated for this reason.  Risks and benefits including filter thrombosis, migration, fracture, bleeding, and infection were all discussed.  We discussed that all IVC filters that we place can be removed if desired from the patient once the need for the filter has passed.    DESCRIPTION: After obtaining full informed written consent, the patient was brought back to the vascular suite. The skin was sterilely prepped and draped in a sterile surgical field was created. Moderate conscious sedation was administered during a face to face encounter with the patient throughout the procedure with my supervision of the RN administering medicines and monitoring the patient's vital signs, pulse oximetry, telemetry and mental status throughout from the start of the procedure until the patient was taken to the recovery room. The right femoral vein was accessed under direct ultrasound guidance without difficulty with a Seldinger needle and a J-wire was then placed. After skin nick and dilatation, the  delivery sheath was placed into the inferior vena cava and an inferior venacavogram was performed. This demonstrated a patent IVC with the level of the renal veins at L1.  The filter was then deployed into the inferior vena cava at the level of the L1-L2 interspace just below the renal veins. The delivery sheath was then removed. Pressure was held. Sterile dressings were placed. The patient tolerated the procedure well and was taken to the recovery room in stable condition.  COMPLICATIONS: None  CONDITION: Stable  Festus Barren  11/09/2023, 3:11 PM   This note was created with Dragon Medical transcription system. Any errors in dictation are purely unintentional.

## 2023-11-09 NOTE — Progress Notes (Signed)
Subjective:    Patient ID: Elizabeth Martin, female    DOB: [birth year], 62 y.o.   MRN: [id number] Chief Complaint  Patient presents with   New Patient (Initial Visit)    np. consult. no studies. eval for IVC placement prior to hip replacement. aberman, zachary. 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  DVT was identified years ago and was treated with anticoagulation.  The presenting symptoms were pain and swelling in the lower extremity.  The patient has had multiple DVTs with multiple different procedures.  She has a history of autoimmune disorder.  She also has marked lymphedema of the right lower extremity.  She currently works with lymphedema clinic to maintain control but she has a very good regimen currently.  No SOB or pleuritic chest pains.  No cough or hemoptysis.  The patient is currently not  on anticoagulation. No blood per rectum or blood in any sputum.  No excessive bruising per the patient.        Review of Systems  Cardiovascular:  Positive for leg swelling.  All other systems reviewed and are negative.      Objective:   Physical Exam Vitals reviewed.  HENT:     Head: Normocephalic.  Cardiovascular:     Rate and Rhythm: Normal rate.     Pulses: Normal pulses.  Pulmonary:     Effort: Pulmonary effort is normal.  Musculoskeletal:     Right lower leg: Edema present.  Skin:    General: Skin is warm and dry.  Neurological:     Mental Status: She is alert and oriented to person, place, and time.  Psychiatric:        Mood and Affect: Mood normal.        Behavior: Behavior normal.        Thought Content: Thought content normal.        Judgment: Judgment normal.     BP 122/76 (BP Location: Right Arm)   Pulse 90   Resp 18   Ht 5\' 7"  (1.702 m)   Wt 179 lb 6.4 oz (81.4 kg)   BMI 28.10 kg/m   Past Medical History:  Diagnosis Date   Anxiety    Asthma     Autoimmune disease (HCC)    Cancer (HCC)    Endometrial lymph node removal (30)   Corneal abrasion, right 05/24/2022   Depression    GERD (gastroesophageal reflux disease)    H/O blood clots    upper rt groin and behind both knees   History of hiatal hernia    Hypertension    Lymphedema    right leg   Morphea scleroderma    PONV (postoperative nausea and vomiting)    states gets violently ill    Social History   Socioeconomic History   Marital status: Married    Spouse name: Not on file   Number of children: Not on file   Years of education: Not on file   Highest education level: Not on file  Occupational History   Not on file  Tobacco Use   Smoking status: Never   Smokeless tobacco: Never  Vaping Use   Vaping status: Never Used  Substance and Sexual Activity   Alcohol use: Yes    Comment: occ.   Drug use: Never   Sexual activity: Not Currently  Other Topics Concern   Not on file  Social  History Narrative   ** Merged History Encounter **       Social Determinants of Health   Financial Resource Strain: Low Risk  (08/20/2023)   Received from [facility]   Overall Financial Resource Strain (CARDIA)    Difficulty of Paying Living Expenses: Not hard at all  Food Insecurity: No Food Insecurity (08/20/2023)   Received from [facility]   Hunger Vital Sign    Worried About Running Out of Food in the Last Year: Never true    Ran Out of Food in the Last Year: Never true  Transportation Needs: No Transportation Needs (08/20/2023)   Received from [facility]   PRAPARE - Transportation    Lack of Transportation (Medical): No    Lack of Transportation (Non-Medical): No  Physical Activity: Not on file  Stress: Not on file  Social Connections: Unknown (02/01/2023)   Received from [facility]   Social Network    Social Network: Not on file  Intimate Partner Violence: Not At Risk (08/20/2023)   Received from [facility]   Humiliation,  Afraid, Rape, and Kick questionnaire    Fear of Current or Ex-Partner: No    Emotionally Abused: No    Physically Abused: No    Sexually Abused: No    Past Surgical History:  Procedure Laterality Date   BASAL CELL CARCINOMA EXCISION Left    CHOLECYSTECTOMY  2008   FLEXIBLE SIGMOIDOSCOPY N/A 05/19/2022   Procedure: FLEXIBLE SIGMOIDOSCOPY;  Surgeon: Toledo, Boykin Nearing, MD;  Location: [facility] ENDOSCOPY;  Service: Gastroenterology;  Laterality: N/A;   HERNIA REPAIR  2004   ILEOSTOMY CLOSURE N/A 08/24/2022   Procedure: ILEOSTOMY TAKEDOWN, open loop with Lynden Oxford, PA-C to assist;  Surgeon: Henrene Dodge, MD;  Location: [facility] ORS;  Service: General;  Laterality: N/A;   PLANTAR FASCIECTOMY     ROBOTIC ASSISTED TOTAL HYSTERECTOMY WITH BILATERAL SALPINGO OOPHERECTOMY  02/14/2012   ROTATOR CUFF REPAIR Left 03/24/2022   TONSILLECTOMY     XI ROBOTIC ASSISTED LOWER ANTERIOR RESECTION N/A 05/24/2022   Procedure: XI ROBOTIC ASSISTED LOWER ANTERIOR RESECTION;  Surgeon: Henrene Dodge, MD;  Location: [facility] ORS;  Service: General;  Laterality: N/A;    History reviewed. No pertinent family history.  Allergies  Allergen Reactions   Carboplatin Anaphylaxis        Hydromorphone Anaphylaxis    Stopped breathing  Respiratory arrest - due to dosage administered per the patient.   Latex Hives, Itching and Rash   Nitrofurantoin Nausea And Vomiting   Silicone Itching and Rash   Wheat Other (See Comments)    Body aches and swelling    Fentanyl Hives, Itching and Rash       Latest Ref Rng & Units 11/01/2023   11:10 AM 02/12/2023    6:24 AM 02/11/2023   11:01 AM  CBC  WBC 4.0 - 10.5 K/uL 8.7  8.9  11.5   Hemoglobin 12.0 - 15.0 g/dL 78.2  95.6  21.3   Hematocrit 36.0 - 46.0 % 37.4  36.3  42.2   Platelets 150 - 400 K/uL 366  279  342       CMP     Component Value Date/Time   NA 135 11/01/2023 1110   K 3.9 11/01/2023 1110   CL 102 11/01/2023 1110   CO2 25 11/01/2023 1110   GLUCOSE 100 (H)  11/01/2023 1110   BUN 10 11/01/2023 1110   CREATININE 0.56 11/01/2023 1110   CALCIUM 8.6 (L) 11/01/2023  1110   PROT 6.1 (L) 11/01/2023 1110   ALBUMIN 3.8 11/01/2023 1110   AST 18 11/01/2023 1110   ALT 19 11/01/2023 1110   ALKPHOS 57 11/01/2023 1110   BILITOT 0.6 11/01/2023 1110   GFRNONAA >60 11/01/2023 1110     No results found.     Assessment & Plan:   1. Deep vein thrombosis (DVT) of proximal lower extremity, unspecified chronicity, unspecified laterality (HCC)   IVC filter is strongly indicated prior to high risk orthopedic surgery.  Especially given the history of PE / DVT.  IVC filter placement will be done the week for surgery. Risk and benefits were reviewed the patient.  Indications for the procedure were reviewed.  All questions were answered, the patient agrees to proceed.    Extensive history of lymphedema.  She will continue with conservative therapy treatments The patient will follow-up with me 6-8 weeks after the joint replacement surgery to discuss removal (this was also discussed today and the patient agrees with the plan to have the filter removed).   2. Benign hypertension Continue antihypertensive medications as already ordered, these medications have been reviewed and there are no changes at this time.  3. Polyarthralgia Patient has upcoming hip surgery scheduled on 11/14/2023   Current Outpatient Medications on File Prior to Visit  Medication Sig Dispense Refill   albuterol (VENTOLIN HFA) 108 (90 Base) MCG/ACT inhaler Inhale 2 puffs into the lungs every 6 (six) hours as needed for shortness of breath or wheezing.     Alum Hydroxide-Mag Carbonate (GAVISCON PO) Take 4 tablets by mouth daily as needed (upset stomach).     busPIRone (BUSPAR) 10 MG tablet Take 10 mg by mouth 2 (two) times daily.     celecoxib (CELEBREX) 200 MG capsule Take 200 mg by mouth 2 (two) times daily.     cetirizine (ZYRTEC) 10 MG tablet Take 10 mg by mouth daily as needed for  allergies.     desonide (DESOWEN) 0.05 % cream Apply 1 Application topically 2 (two) times daily as needed (morphea flare).     DULoxetine (CYMBALTA) 30 MG capsule Take 30-60 mg by mouth See admin instructions. Take 60 mg in the morning and 30 mg midday     famotidine-calcium carbonate-magnesium hydroxide (PEPCID COMPLETE) 10-800-165 MG chewable tablet Chew 1 tablet by mouth daily as needed (heartburn).     folic acid (FOLVITE) 1 MG tablet Take 1 mg by mouth daily.     hydrocortisone-pramoxine (ANALPRAM-HC) 2.5-1 % rectal cream Place 1 Application rectally 3 (three) times daily as needed for hemorrhoids.     hydroxychloroquine (PLAQUENIL) 200 MG tablet Take 200 mg by mouth 2 (two) times daily.     lactulose (CHRONULAC) 10 GM/15ML solution Take 3 mLs by mouth 2 (two) times daily.     lisinopril (ZESTRIL) 40 MG tablet Take 40 mg by mouth daily.     methotrexate (RHEUMATREX) 2.5 MG tablet Take 3 tablets by mouth 2 (two) times a week. Take 7.5 mg on Mondays and Tuesdays     Multiple Vitamin (MULTIVITAMIN) capsule Take 1 capsule by mouth daily.     omeprazole (PRILOSEC) 40 MG capsule Take 40 mg by mouth daily.     ondansetron (ZOFRAN-ODT) 8 MG disintegrating tablet Take 8 mg by mouth every 8 (eight) hours as needed for vomiting or nausea.     triamcinolone (KENALOG) 0.025 % cream Apply 1 Application topically 2 (two) times daily as needed (morphea flare).     trospium (SANCTURA) 20 MG  tablet Take 1 tablet (20 mg total) by mouth 2 (two) times daily. (Patient taking differently: Take 20 mg by mouth 2 (two) times daily as needed (overactive bladder).) 60 tablet 3   No current facility-administered medications on file prior to visit.    There are no Patient Instructions on file for this visit. No follow-ups on file.   Georgiana Spinner, NP

## 2023-11-09 NOTE — Interval H&P Note (Signed)
History and Physical Interval Note:  11/09/2023 1:37 PM  Elizabeth Martin  has presented today for surgery, with the diagnosis of IVC filter placement   DVT.  The various methods of treatment have been discussed with the patient and family. After consideration of risks, benefits and other options for treatment, the patient has consented to  Procedure(s): IVC FILTER INSERTION (N/A) as a surgical intervention.  The patient's history has been reviewed, patient examined, no change in status, stable for surgery.  I have reviewed the patient's chart and labs.  Questions were answered to the patient's satisfaction.     Festus Barren

## 2023-11-10 ENCOUNTER — Ambulatory Visit: Payer: BC Managed Care – PPO | Admitting: Psychiatry

## 2023-11-10 ENCOUNTER — Encounter: Payer: Self-pay | Admitting: Vascular Surgery

## 2023-11-10 VITALS — BP 128/75 | HR 85 | Temp 98.2°F | Ht 66.0 in | Wt 179.0 lb

## 2023-11-10 DIAGNOSIS — D689 Coagulation defect, unspecified: Secondary | ICD-10-CM | POA: Insufficient documentation

## 2023-11-10 DIAGNOSIS — F411 Generalized anxiety disorder: Secondary | ICD-10-CM | POA: Insufficient documentation

## 2023-11-10 DIAGNOSIS — F4310 Post-traumatic stress disorder, unspecified: Secondary | ICD-10-CM | POA: Diagnosis not present

## 2023-11-10 DIAGNOSIS — Z79899 Other long term (current) drug therapy: Secondary | ICD-10-CM | POA: Insufficient documentation

## 2023-11-10 DIAGNOSIS — F332 Major depressive disorder, recurrent severe without psychotic features: Secondary | ICD-10-CM

## 2023-11-10 MED ORDER — DULOXETINE HCL 30 MG PO CPEP: 30.0000 mg | ORAL_CAPSULE | Freq: Two times a day (BID) | ORAL | Status: DC

## 2023-11-10 MED ORDER — BUPROPION HCL 75 MG PO TABS: 75.0000 mg | ORAL_TABLET | Freq: Every morning | ORAL | 0 refills | Status: DC

## 2023-11-10 MED ORDER — TRAZODONE HCL 50 MG PO TABS: 50.0000 mg | ORAL_TABLET | Freq: Every evening | ORAL | 0 refills | Status: DC | PRN
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
## 2023-11-10 NOTE — Patient Instructions (Addendum)
Call for therapy   [facility] [address] [place], [place], [place] [address]  [phone]   [facility] in [place] [address] [place], [place] [address]  [phone]   Bupropion Tablets (Depression/Mood Disorders) What is this medication? BUPROPION (byoo PROE pee on) treats depression. It increases norepinephrine and dopamine in the brain, hormones that help regulate mood. It belongs to a group of medications called NDRIs. This medicine may be used for other purposes; ask your health care provider or pharmacist if you have questions. COMMON BRAND NAME(S): Wellbutrin What should I tell my care team before I take this medication? They need to know if you have any of these conditions: An eating disorder, such as anorexia or bulimia Bipolar disorder or psychosis Diabetes or high blood sugar, treated with medication Glaucoma Head injury or brain tumor Heart disease, previous heart attack, or irregular heart beat High blood pressure Kidney disease Liver disease Seizures Suicidal thoughts, plans, or attempt by you or a family member Tourette syndrome Weight loss An unusual or allergic reaction to bupropion, other medications, foods, dyes, or preservatives Pregnant or trying to become pregnant Breastfeeding How should I use this medication? Take this medication by mouth with a glass of water. Follow the directions on the prescription label. You can take it with or without food. If it upsets your stomach, take it with food. Take your medication at regular intervals. Do not take your medication more often than directed. Do not stop taking this medication suddenly except upon the advice of your care team. Stopping this medication too quickly may cause serious side effects or your condition may worsen. A special MedGuide will be given to you by the pharmacist with each prescription and refill. Be sure  to read this information carefully each time. Talk to your care team regarding the use of this medication in children. Special care may be needed. Overdosage: If you think you have taken too much of this medicine contact a poison control center or emergency room at once. NOTE: This medicine is only for you. Do not share this medicine with others. What if I miss a dose? If you miss a dose, take it as soon as you can. If it is less than four hours to your next dose, take only that dose and skip the missed dose. Do not take double or extra doses. What may interact with this medication? Do not take this medication with any of the following: Linezolid MAOIs, such as Azilect, Carbex, Eldepryl, Marplan, Nardil, and Parnate Methylene blue (injected into a vein) Other medications that contain bupropion, such as Zyban This medication may also interact with the following: Alcohol Certain medications for anxiety or sleep Certain medications for blood pressure, such as metoprolol, propranolol Certain medications for HIV or AIDS, such as efavirenz, lopinavir, nelfinavir, ritonavir Certain medications for irregular heartbeat, such as propafenone, flecainide Certain medications for mental health conditions Certain medications for Parkinson disease, such as amantadine, levodopa Certain medications for seizures, such as carbamazepine, phenytoin, phenobarbital Cimetidine Clopidogrel Cyclophosphamide Digoxin Furazolidone Isoniazid Nicotine Orphenadrine Procarbazine Steroid medications, such as prednisone or cortisone Stimulant medications for attention disorders, weight loss, or to stay awake Tamoxifen Theophylline Thiotepa Ticlopidine Tramadol Warfarin This list may not describe all possible interactions. Give your health care provider a list of all the medicines, herbs, non-prescription drugs, or dietary supplements you use. Also tell them if you smoke, drink alcohol, or use illegal drugs. Some  items may interact with your medicine. What should I watch for while using this medication? Tell your care team if your symptoms do not get better or if they get worse. Visit your care team for regular checks on your progress. Because it may take several weeks to see the full effects of this medication, it is important to continue your treatment as prescribed. This medication may cause thoughts of suicide or depression. This includes sudden changes in mood, behaviors, or thoughts. These changes can happen at any time but are more common in the beginning of treatment or after a change in dose. Call your care team right away if you experience these thoughts or worsening depression. This medication may cause mood and behavior changes, such as anxiety, nervousness, irritability, hostility, restlessness, excitability, hyperactivity, or trouble sleeping. These changes can happen at any time but are more common in the beginning of treatment or after a change in dose. Call your care team right away if you notice any of these symptoms. This medication may cause serious skin reactions. They can happen weeks to months after starting the medication. Contact your care team right away if you notice fevers or flu-like symptoms with a rash. The rash may be red or purple and then turn into blisters or peeling of the skin. You may also notice a red rash with swelling of the face, lips, or lymph nodes in your neck or under your arms. Avoid drinks that contain alcohol while taking this medication. Drinking large amounts of alcohol, using sleeping or anxiety medications, or quickly stopping the use of these agents while taking this medication may increase your risk for a seizure. This medication may affect your coordination, reaction time, or judgment. Do not drive or operate machinery until you know how this medication affects you. Do not take this medication close to bedtime. It may prevent you from sleeping. Your mouth may  get dry. Chewing sugarless gum or sucking hard candy and drinking plenty of water may help. Contact your care team if the problem does not go away or is severe. What side effects may I notice from receiving this medication? Side effects that you should report to your care team as soon as possible: Allergic reactions--skin rash, itching, hives, swelling of the face, lips, tongue, or throat Increase in blood pressure Mood and behavior changes--anxiety, nervousness, confusion, hallucinations, irritability, hostility, thoughts of suicide or self-harm, worsening mood, feelings of depression Redness, blistering, peeling, or loosening of the skin, including inside the mouth Seizures Sudden eye pain or change in vision such as blurry vision, seeing halos around lights, vision loss Side effects that usually do not require medical attention (report to your care team if they continue or are bothersome): Constipation Dizziness Dry mouth Loss of appetite Nausea Tremors or shaking Trouble sleeping This list may not describe all possible side effects. Call your doctor for medical advice about side effects. You may report side effects to FDA at 1-800-FDA-1088. Where should I keep my medication? Keep out of the reach of children and pets. Store at room temperature between 20 and 25 degrees C (68 and 77 degrees F), away from direct sunlight and moisture. Keep tightly closed. Throw away any unused medication after the expiration date. NOTE: This sheet is a summary. It may not cover all possible information. If you have questions about this medicine, talk to your doctor, pharmacist, or health care provider.  2024 Elsevier/Gold Standard (2022-08-15 00:00:00)

## 2023-11-11 ENCOUNTER — Encounter: Payer: Self-pay | Admitting: Vascular Surgery

## 2023-11-12 ENCOUNTER — Other Ambulatory Visit: Payer: Self-pay | Admitting: Urology

## 2023-11-12 DIAGNOSIS — N3281 Overactive bladder: Secondary | ICD-10-CM

## 2023-11-13 MED ORDER — CEFAZOLIN SODIUM-DEXTROSE 2-4 GM/100ML-% IV SOLN
2.0000 g | INTRAVENOUS | Status: AC
  Administered 2023-11-14: 2 g via INTRAVENOUS

## 2023-11-13 MED ORDER — DEXAMETHASONE SODIUM PHOSPHATE 10 MG/ML IJ SOLN
8.0000 mg | Freq: Once | INTRAMUSCULAR | Status: AC
  Administered 2023-11-14: 10 mg via INTRAVENOUS

## 2023-11-13 MED ORDER — ORAL CARE MOUTH RINSE: 15.0000 mL | Freq: Once | OROMUCOSAL | Status: AC

## 2023-11-13 MED ORDER — TRANEXAMIC ACID-NACL 1000-0.7 MG/100ML-% IV SOLN
1000.0000 mg | INTRAVENOUS | Status: AC
Start: 2023-11-13 — End: 2023-11-14
  Administered 2023-11-14: 1000 mg via INTRAVENOUS

## 2023-11-13 MED ORDER — CHLORHEXIDINE GLUCONATE 0.12 % MT SOLN
15.0000 mL | Freq: Once | OROMUCOSAL | Status: AC
  Administered 2023-11-14: 15 mL via OROMUCOSAL

## 2023-11-13 MED ORDER — LACTATED RINGERS IV SOLN: INTRAVENOUS | Status: DC

## 2023-11-14 ENCOUNTER — Encounter
Admission: RE | Disposition: A | Discharge status: Home / Self Care | Payer: Self-pay | Source: Home / Self Care | Attending: Orthopedic Surgery

## 2023-11-14 ENCOUNTER — Other Ambulatory Visit: Payer: Self-pay

## 2023-11-14 ENCOUNTER — Ambulatory Visit: Payer: BC Managed Care – PPO

## 2023-11-14 ENCOUNTER — Ambulatory Visit: Payer: BC Managed Care – PPO | Admitting: Anesthesiology

## 2023-11-14 ENCOUNTER — Ambulatory Visit: Payer: BC Managed Care – PPO | Admitting: Urgent Care

## 2023-11-14 ENCOUNTER — Observation Stay
Admission: RE | Admit: 2023-11-14 | Discharge: 2023-11-15 | Disposition: A | Discharge status: Home / Self Care | Payer: BC Managed Care – PPO | Attending: Orthopedic Surgery | Admitting: Orthopedic Surgery

## 2023-11-14 ENCOUNTER — Encounter: Payer: Self-pay | Admitting: Orthopedic Surgery

## 2023-11-14 DIAGNOSIS — Z8542 Personal history of malignant neoplasm of other parts of uterus: Secondary | ICD-10-CM | POA: Insufficient documentation

## 2023-11-14 DIAGNOSIS — Z79899 Other long term (current) drug therapy: Secondary | ICD-10-CM | POA: Insufficient documentation

## 2023-11-14 DIAGNOSIS — J45909 Unspecified asthma, uncomplicated: Secondary | ICD-10-CM | POA: Diagnosis not present

## 2023-11-14 DIAGNOSIS — M1611 Unilateral primary osteoarthritis, right hip: Principal | ICD-10-CM | POA: Insufficient documentation

## 2023-11-14 DIAGNOSIS — Z96641 Presence of right artificial hip joint: Principal | ICD-10-CM | POA: Diagnosis present

## 2023-11-14 DIAGNOSIS — I10 Essential (primary) hypertension: Secondary | ICD-10-CM | POA: Diagnosis not present

## 2023-11-14 HISTORY — PX: TOTAL HIP ARTHROPLASTY: SHX124

## 2023-11-14 SURGERY — ARTHROPLASTY, HIP, TOTAL,POSTERIOR APPROACH
Anesthesia: Spinal | Site: Hip | Laterality: Right

## 2023-11-14 MED ORDER — ACETAMINOPHEN 325 MG PO TABS: 325.0000 mg | ORAL_TABLET | Freq: Four times a day (QID) | ORAL | Status: DC | PRN

## 2023-11-14 MED ORDER — BUPIVACAINE LIPOSOME 1.3 % IJ SUSP
INTRAMUSCULAR | Status: AC
  Filled 2023-11-14: qty 20

## 2023-11-14 MED ORDER — MIDAZOLAM HCL 2 MG/2ML IJ SOLN
INTRAMUSCULAR | Status: AC
  Filled 2023-11-14: qty 2

## 2023-11-14 MED ORDER — ALBUTEROL SULFATE (2.5 MG/3ML) 0.083% IN NEBU: 3.0000 mL | INHALATION_SOLUTION | Freq: Four times a day (QID) | RESPIRATORY_TRACT | Status: DC | PRN

## 2023-11-14 MED ORDER — OXYCODONE HCL 5 MG PO TABS
5.0000 mg | ORAL_TABLET | ORAL | Status: DC | PRN
  Administered 2023-11-14 – 2023-11-15 (×3): 5 mg via ORAL

## 2023-11-14 MED ORDER — TRAMADOL HCL 50 MG PO TABS
50.0000 mg | ORAL_TABLET | Freq: Four times a day (QID) | ORAL | Status: DC | PRN
  Administered 2023-11-14 – 2023-11-15 (×2): 50 mg via ORAL

## 2023-11-14 MED ORDER — SODIUM CHLORIDE 0.9 % IR SOLN
Status: DC | PRN
  Administered 2023-11-14: 3000 mL

## 2023-11-14 MED ORDER — HYDROXYCHLOROQUINE SULFATE 200 MG PO TABS
200.0000 mg | ORAL_TABLET | Freq: Two times a day (BID) | ORAL | Status: DC
  Administered 2023-11-14 – 2023-11-15 (×2): 200 mg via ORAL
  Filled 2023-11-14 (×2): qty 1

## 2023-11-14 MED ORDER — PROPOFOL 10 MG/ML IV BOLUS
INTRAVENOUS | Status: DC | PRN
  Administered 2023-11-14 (×2): 30 mg via INTRAVENOUS
  Administered 2023-11-14: 40 mg via INTRAVENOUS
  Administered 2023-11-14 (×2): 30 mg via INTRAVENOUS

## 2023-11-14 MED ORDER — DOCUSATE SODIUM 100 MG PO CAPS
ORAL_CAPSULE | ORAL | Status: AC
  Filled 2023-11-14: qty 1

## 2023-11-14 MED ORDER — SODIUM CHLORIDE 0.9 % IV SOLN: INTRAVENOUS | Status: DC | PRN

## 2023-11-14 MED ORDER — METOCLOPRAMIDE HCL 10 MG PO TABS: 5.0000 mg | ORAL_TABLET | Freq: Three times a day (TID) | ORAL | Status: DC | PRN

## 2023-11-14 MED ORDER — BUPIVACAINE-EPINEPHRINE (PF) 0.25% -1:200000 IJ SOLN
INTRAMUSCULAR | Status: AC
  Filled 2023-11-14: qty 30

## 2023-11-14 MED ORDER — BUPIVACAINE HCL (PF) 0.5 % IJ SOLN
INTRAMUSCULAR | Status: DC | PRN
  Administered 2023-11-14: 2.6 mL

## 2023-11-14 MED ORDER — ONDANSETRON HCL 4 MG/2ML IJ SOLN
4.0000 mg | Freq: Four times a day (QID) | INTRAMUSCULAR | Status: DC | PRN
  Administered 2023-11-14: 4 mg via INTRAVENOUS

## 2023-11-14 MED ORDER — CEFAZOLIN SODIUM-DEXTROSE 2-4 GM/100ML-% IV SOLN
INTRAVENOUS | Status: AC
  Filled 2023-11-14: qty 100

## 2023-11-14 MED ORDER — DOCUSATE SODIUM 100 MG PO CAPS
100.0000 mg | ORAL_CAPSULE | Freq: Two times a day (BID) | ORAL | Status: DC
  Administered 2023-11-14 – 2023-11-15 (×2): 100 mg via ORAL

## 2023-11-14 MED ORDER — MIDAZOLAM HCL 5 MG/5ML IJ SOLN
INTRAMUSCULAR | Status: DC | PRN
  Administered 2023-11-14: 2 mg via INTRAVENOUS

## 2023-11-14 MED ORDER — PHENYLEPHRINE HCL-NACL 20-0.9 MG/250ML-% IV SOLN
INTRAVENOUS | Status: AC
  Filled 2023-11-14: qty 250

## 2023-11-14 MED ORDER — PROPOFOL 1000 MG/100ML IV EMUL
INTRAVENOUS | Status: AC
  Filled 2023-11-14: qty 100

## 2023-11-14 MED ORDER — LISINOPRIL 20 MG PO TABS
40.0000 mg | ORAL_TABLET | Freq: Every day | ORAL | Status: DC
  Administered 2023-11-14 – 2023-11-15 (×2): 40 mg via ORAL

## 2023-11-14 MED ORDER — ONDANSETRON HCL 4 MG/2ML IJ SOLN
INTRAMUSCULAR | Status: DC | PRN
  Administered 2023-11-14: 4 mg via INTRAVENOUS

## 2023-11-14 MED ORDER — TRAZODONE HCL 50 MG PO TABS
50.0000 mg | ORAL_TABLET | Freq: Every evening | ORAL | Status: DC | PRN
  Administered 2023-11-14: 50 mg via ORAL
  Filled 2023-11-14 (×2): qty 1

## 2023-11-14 MED ORDER — ONDANSETRON HCL 4 MG/2ML IJ SOLN
INTRAMUSCULAR | Status: AC
  Filled 2023-11-14: qty 2

## 2023-11-14 MED ORDER — CHLORHEXIDINE GLUCONATE 0.12 % MT SOLN
OROMUCOSAL | Status: AC
  Filled 2023-11-14: qty 15

## 2023-11-14 MED ORDER — KETOROLAC TROMETHAMINE 15 MG/ML IJ SOLN
INTRAMUSCULAR | Status: AC
  Filled 2023-11-14: qty 1

## 2023-11-14 MED ORDER — TRANEXAMIC ACID-NACL 1000-0.7 MG/100ML-% IV SOLN
INTRAVENOUS | Status: AC
  Filled 2023-11-14: qty 100

## 2023-11-14 MED ORDER — ONDANSETRON HCL 4 MG PO TABS: 4.0000 mg | ORAL_TABLET | Freq: Four times a day (QID) | ORAL | Status: DC | PRN

## 2023-11-14 MED ORDER — BUSPIRONE HCL 10 MG PO TABS
10.0000 mg | ORAL_TABLET | Freq: Two times a day (BID) | ORAL | Status: DC
  Administered 2023-11-14 – 2023-11-15 (×2): 10 mg via ORAL
  Filled 2023-11-14 (×2): qty 1

## 2023-11-14 MED ORDER — SODIUM CHLORIDE (PF) 0.9 % IJ SOLN
INTRAMUSCULAR | Status: DC | PRN
  Administered 2023-11-14: 50 mL

## 2023-11-14 MED ORDER — METOCLOPRAMIDE HCL 5 MG/ML IJ SOLN
5.0000 mg | Freq: Three times a day (TID) | INTRAMUSCULAR | Status: DC | PRN
Start: 2023-11-14 — End: 2023-11-15

## 2023-11-14 MED ORDER — MORPHINE SULFATE (PF) 4 MG/ML IV SOLN: 0.5000 mg | INTRAVENOUS | Status: DC | PRN

## 2023-11-14 MED ORDER — PANTOPRAZOLE SODIUM 40 MG PO TBEC
DELAYED_RELEASE_TABLET | ORAL | Status: AC
  Filled 2023-11-14: qty 1

## 2023-11-14 MED ORDER — PHENYLEPHRINE HCL-NACL 20-0.9 MG/250ML-% IV SOLN
INTRAVENOUS | Status: DC | PRN
  Administered 2023-11-14: 30 ug/min via INTRAVENOUS

## 2023-11-14 MED ORDER — OXYCODONE HCL 5 MG PO TABS: 5.0000 mg | ORAL_TABLET | Freq: Once | ORAL | Status: DC | PRN

## 2023-11-14 MED ORDER — TRANEXAMIC ACID-NACL 1000-0.7 MG/100ML-% IV SOLN
INTRAVENOUS | Status: DC | PRN
  Administered 2023-11-14: 1000 mg via INTRAVENOUS

## 2023-11-14 MED ORDER — BUPIVACAINE HCL (PF) 0.5 % IJ SOLN
INTRAMUSCULAR | Status: AC
Start: 2023-11-14 — End: ?
  Filled 2023-11-14: qty 10

## 2023-11-14 MED ORDER — KETAMINE HCL 50 MG/5ML IJ SOSY
PREFILLED_SYRINGE | INTRAMUSCULAR | Status: DC | PRN
  Administered 2023-11-14: 10 mg via INTRAVENOUS
  Administered 2023-11-14 (×2): 20 mg via INTRAVENOUS

## 2023-11-14 MED ORDER — APIXABAN 2.5 MG PO TABS
2.5000 mg | ORAL_TABLET | Freq: Two times a day (BID) | ORAL | Status: DC
  Administered 2023-11-15: 2.5 mg via ORAL

## 2023-11-14 MED ORDER — DULOXETINE HCL 30 MG PO CPEP
30.0000 mg | ORAL_CAPSULE | Freq: Two times a day (BID) | ORAL | Status: DC
  Administered 2023-11-14 – 2023-11-15 (×2): 30 mg via ORAL
  Filled 2023-11-14 (×2): qty 1

## 2023-11-14 MED ORDER — PROPOFOL 500 MG/50ML IV EMUL
INTRAVENOUS | Status: DC | PRN
  Administered 2023-11-14: 100 ug/kg/min via INTRAVENOUS

## 2023-11-14 MED ORDER — LORATADINE 10 MG PO TABS
10.0000 mg | ORAL_TABLET | Freq: Every day | ORAL | Status: DC
  Administered 2023-11-14 – 2023-11-15 (×2): 10 mg via ORAL

## 2023-11-14 MED ORDER — ACETAMINOPHEN 500 MG PO TABS
ORAL_TABLET | ORAL | Status: AC
  Filled 2023-11-14: qty 2

## 2023-11-14 MED ORDER — PHENOL 1.4 % MT LIQD: 1.0000 | OROMUCOSAL | Status: DC | PRN

## 2023-11-14 MED ORDER — OXYCODONE HCL 5 MG/5ML PO SOLN: 5.0000 mg | Freq: Once | ORAL | Status: DC | PRN

## 2023-11-14 MED ORDER — KETAMINE HCL 50 MG/5ML IJ SOSY
PREFILLED_SYRINGE | INTRAMUSCULAR | Status: AC
  Filled 2023-11-14: qty 5

## 2023-11-14 MED ORDER — CEFAZOLIN SODIUM-DEXTROSE 2-4 GM/100ML-% IV SOLN
INTRAVENOUS | Status: AC
Start: 2023-11-14 — End: ?
  Filled 2023-11-14: qty 100

## 2023-11-14 MED ORDER — OXYCODONE HCL 5 MG PO TABS
ORAL_TABLET | ORAL | Status: AC
  Filled 2023-11-14: qty 1

## 2023-11-14 MED ORDER — CEFAZOLIN SODIUM-DEXTROSE 2-4 GM/100ML-% IV SOLN
2.0000 g | Freq: Four times a day (QID) | INTRAVENOUS | Status: AC
  Administered 2023-11-14 – 2023-11-15 (×2): 2 g via INTRAVENOUS

## 2023-11-14 MED ORDER — ACETAMINOPHEN 500 MG PO TABS
1000.0000 mg | ORAL_TABLET | Freq: Three times a day (TID) | ORAL | Status: DC
  Administered 2023-11-14 – 2023-11-15 (×2): 1000 mg via ORAL

## 2023-11-14 MED ORDER — BUPROPION HCL 75 MG PO TABS
75.0000 mg | ORAL_TABLET | Freq: Every morning | ORAL | Status: DC
  Administered 2023-11-15: 75 mg via ORAL
  Filled 2023-11-14: qty 1

## 2023-11-14 MED ORDER — ACETAMINOPHEN 10 MG/ML IV SOLN
INTRAVENOUS | Status: AC
  Filled 2023-11-14: qty 100

## 2023-11-14 MED ORDER — 0.9 % SODIUM CHLORIDE (POUR BTL) OPTIME
TOPICAL | Status: DC | PRN
  Administered 2023-11-14: 500 mL

## 2023-11-14 MED ORDER — LISINOPRIL 20 MG PO TABS
ORAL_TABLET | ORAL | Status: AC
  Filled 2023-11-14: qty 2

## 2023-11-14 MED ORDER — SODIUM CHLORIDE 0.9 % IV SOLN: INTRAVENOUS | Status: DC

## 2023-11-14 MED ORDER — ACETAMINOPHEN 10 MG/ML IV SOLN
INTRAVENOUS | Status: DC | PRN
  Administered 2023-11-14: 1000 mg via INTRAVENOUS

## 2023-11-14 MED ORDER — MENTHOL 3 MG MT LOZG: 1.0000 | LOZENGE | OROMUCOSAL | Status: DC | PRN

## 2023-11-14 MED ORDER — SODIUM CHLORIDE FLUSH 0.9 % IV SOLN
INTRAVENOUS | Status: AC
  Filled 2023-11-14: qty 10

## 2023-11-14 MED ORDER — KETOROLAC TROMETHAMINE 15 MG/ML IJ SOLN
7.5000 mg | Freq: Four times a day (QID) | INTRAMUSCULAR | Status: DC
  Administered 2023-11-14 – 2023-11-15 (×3): 7.5 mg via INTRAVENOUS

## 2023-11-14 MED ORDER — DEXAMETHASONE SODIUM PHOSPHATE 10 MG/ML IJ SOLN
INTRAMUSCULAR | Status: AC
  Filled 2023-11-14: qty 1

## 2023-11-14 MED ORDER — LORATADINE 10 MG PO TABS
ORAL_TABLET | ORAL | Status: AC
Start: 2023-11-14 — End: ?
  Filled 2023-11-14: qty 1

## 2023-11-14 MED ORDER — TRAMADOL HCL 50 MG PO TABS
ORAL_TABLET | ORAL | Status: AC
  Filled 2023-11-14: qty 1

## 2023-11-14 MED ORDER — SURGIPHOR WOUND IRRIGATION SYSTEM - OPTIME: TOPICAL | Status: DC | PRN

## 2023-11-14 MED ORDER — PANTOPRAZOLE SODIUM 40 MG PO TBEC
40.0000 mg | DELAYED_RELEASE_TABLET | Freq: Every day | ORAL | Status: DC
  Administered 2023-11-14 – 2023-11-15 (×2): 40 mg via ORAL
  Filled 2023-11-14: qty 1

## 2023-11-14 SURGICAL SUPPLY — 79 items
BIT DRILL TRIDENT 4X40 SU (BIT) IMPLANT
BLADE SAGITTAL AGGR TOOTH XLG (BLADE) ×1 IMPLANT
BNDG COHESIVE 6X5 TAN ST LF (GAUZE/BANDAGES/DRESSINGS) ×1 IMPLANT
BOWL CEMENT MIX W/ADAPTER (MISCELLANEOUS) IMPLANT
BOWL CEMENT MIXING ADV NOZZLE (MISCELLANEOUS) IMPLANT
CHLORAPREP W/TINT 26 (MISCELLANEOUS) ×2 IMPLANT
COVER SET STULBERG POSITIONER (MISCELLANEOUS) ×1 IMPLANT
CUP MEDICINE 2OZ PLAST GRAD ST (MISCELLANEOUS) IMPLANT
DERMABOND ADVANCED .7 DNX12 (GAUZE/BANDAGES/DRESSINGS) ×1 IMPLANT
DRAPE IMP U-DRAPE 54X76 (DRAPES) ×1 IMPLANT
DRAPE INCISE IOBAN 66X60 STRL (DRAPES) ×1 IMPLANT
DRAPE POUCH INSTRU U-SHP 10X18 (DRAPES) ×1 IMPLANT
DRAPE SHEET LG 3/4 BI-LAMINATE (DRAPES) ×1 IMPLANT
DRAPE TABLE BACK 80X90 (DRAPES) ×1 IMPLANT
DRAPE U-SHAPE 47X51 STRL (DRAPES) ×1 IMPLANT
DRSG MEPILEX SACRM 8.7X9.8 (GAUZE/BANDAGES/DRESSINGS) ×1 IMPLANT
DRSG OPSITE POSTOP 4X10 (GAUZE/BANDAGES/DRESSINGS) IMPLANT
DRSG OPSITE POSTOP 4X12 (GAUZE/BANDAGES/DRESSINGS) IMPLANT
DRSG OPSITE POSTOP 4X8 (GAUZE/BANDAGES/DRESSINGS) IMPLANT
ELECT BLADE 4.0 EZ CLEAN MEGAD (MISCELLANEOUS) ×1
ELECT REM PT RETURN 9FT ADLT (ELECTROSURGICAL) ×1
ELECTRODE BLDE 4.0 EZ CLN MEGD (MISCELLANEOUS) IMPLANT
ELECTRODE REM PT RTRN 9FT ADLT (ELECTROSURGICAL) ×1 IMPLANT
GLOVE BIO SURGEON STRL SZ8 (GLOVE) ×1 IMPLANT
GLOVE BIOGEL PI IND STRL 8 (GLOVE) ×1 IMPLANT
GLOVE PI ORTHO PRO STRL 7.5 (GLOVE) ×2 IMPLANT
GLOVE PI ORTHO PRO STRL SZ8 (GLOVE) ×2 IMPLANT
GLOVE SURG SYN 7.5 E (GLOVE) ×2 IMPLANT
GLOVE SURG SYN 7.5 PF PI (GLOVE) ×2 IMPLANT
GOWN SRG XL LVL 3 NONREINFORCE (GOWNS) ×1 IMPLANT
GOWN STRL REUS W/ TWL LRG LVL3 (GOWN DISPOSABLE) ×1 IMPLANT
GOWN STRL REUS W/ TWL XL LVL3 (GOWN DISPOSABLE) ×1 IMPLANT
HANDLE YANKAUER SUCT OPEN TIP (MISCELLANEOUS) ×1 IMPLANT
HEAD HIP LFIT V40 22 (Head) IMPLANT
HIP HD LFIT V40 22 (Head) ×1 IMPLANT
HOOD PEEL AWAY T7 (MISCELLANEOUS) ×2 IMPLANT
INSERT MDM LINER 22.2X38 38D (Insert) IMPLANT
IV NS 100ML SINGLE PACK (IV SOLUTION) ×1 IMPLANT
IV NS IRRIG 3000ML ARTHROMATIC (IV SOLUTION) ×1 IMPLANT
KIT TURNOVER KIT A (KITS) ×1 IMPLANT
LINER CEMENTLESS SZ D 38 HIP (Liner) IMPLANT
MANIFOLD NEPTUNE II (INSTRUMENTS) ×1 IMPLANT
MARKER SKIN DUAL TIP RULER LAB (MISCELLANEOUS) ×1 IMPLANT
MAT ABSORB FLUID 56X50 GRAY (MISCELLANEOUS) ×1 IMPLANT
NDL FILTER BLUNT 18X1 1/2 (NEEDLE) ×1 IMPLANT
NDL SAFETY ECLIPSE 18X1.5 (NEEDLE) ×1 IMPLANT
NDL SPNL 20GX3.5 QUINCKE YW (NEEDLE) ×1 IMPLANT
NEEDLE FILTER BLUNT 18X1 1/2 (NEEDLE) ×1 IMPLANT
NEEDLE SPNL 20GX3.5 QUINCKE YW (NEEDLE) ×1 IMPLANT
PACK HIP PROSTHESIS (MISCELLANEOUS) ×1 IMPLANT
PAD ARMBOARD 7.5X6 YLW CONV (MISCELLANEOUS) ×2 IMPLANT
PENCIL SMOKE EVACUATOR (MISCELLANEOUS) ×1 IMPLANT
PILLOW ABDUCTION FOAM SM (MISCELLANEOUS) ×1 IMPLANT
PULSAVAC PLUS IRRIG FAN TIP (DISPOSABLE) ×1
RETRIEVER SUT HEWSON (MISCELLANEOUS) ×1 IMPLANT
SCREW HEX LP 6.5X15 (Screw) IMPLANT
SCREW HEX LP 6.5X30 (Screw) IMPLANT
SHELL TRIDENT II CLUST 50 (Shell) IMPLANT
SLEEVE SCD COMPRESS KNEE MED (STOCKING) ×1 IMPLANT
SOLUTION IRRIG SURGIPHOR (IV SOLUTION) ×1 IMPLANT
STEM STD OFFSET SZ5 36 (Stem) IMPLANT
SUT BONE WAX W31G (SUTURE) ×1 IMPLANT
SUT ETHIBOND #5 BRAIDED 30INL (SUTURE) ×1 IMPLANT
SUT STRATA 1 CT-1 DLB (SUTURE) ×1
SUT STRATAFIX 14 PDO 48 VLT (SUTURE) ×1 IMPLANT
SUT STRATAFIX PDO 1 14 VIOLET (SUTURE) ×1
SUT VIC AB 0 CT1 36 (SUTURE) ×1 IMPLANT
SUT VIC AB 1 CT1 36 (SUTURE) IMPLANT
SUT VIC AB 2-0 CT2 27 (SUTURE) ×2 IMPLANT
SUT VICRYL 1-0 27IN ABS (SUTURE) ×1
SUTURE STRATA SPIR 4-0 18 (SUTURE) ×1 IMPLANT
SUTURE VICRYL 1-0 27IN ABS (SUTURE) ×1 IMPLANT
SYR 30ML LL (SYRINGE) ×2 IMPLANT
SYR TB 1ML LL NO SAFETY (SYRINGE) ×1 IMPLANT
TIP FAN IRRIG PULSAVAC PLUS (DISPOSABLE) ×1 IMPLANT
TOWEL OR 17X26 4PK STRL BLUE (TOWEL DISPOSABLE) IMPLANT
TRAP FLUID SMOKE EVACUATOR (MISCELLANEOUS) ×1 IMPLANT
WAND WEREWOLF FASTSEAL 6.0 (MISCELLANEOUS) ×1 IMPLANT
WATER STERILE IRR 1000ML POUR (IV SOLUTION) ×1 IMPLANT

## 2023-11-14 NOTE — H&P (Signed)
Elizabeth Martin is an 62 y.o. female presents today for history and physical for right posterior total hip arthroplasty with Dr. Audelia Acton on 11/14/2023. Patient has MRI and x-rays showing moderate degenerative changes of the right hip. Pain is 10 out of 10 and has been increasing for greater than 1 year. She has tried meloxicam ice heat and physical therapy with no improvement. She has had a cortisone injection, 08/12/2023 which gave her excellent relief for 3 to 4 days. Patient has pain that is interfering with her quality of life and activities a living. Patient is seen Dr. Audelia Acton discussed total hip arthroplasty and agreed and consented for the procedure. The patient denies fevers, chills, numbness, tingling, shortness of breath, chest pain, recent illness, or any trauma.  The patient has a history of right leg lymphedema status post lymph node dissection with scar tissue in her right anterior groin from those procedures.  Patient has a history of DVTs, she is scheduled for IVC filter today.  Patient is a non-smoker, BMI of 26.62 and is a nondiabetic  Past Medical History: Past Medical History:  Diagnosis Date  Adhesive capsulitis of left shoulder  Asthma, unspecified asthma severity, unspecified whether complicated, unspecified whether persistent (HHS-HCC)  Endometrial cancer (CMS/HHS-HCC) 04/05/2018  STAGE IIB May 2013 IMO Update  Essential hypertension 08/09/2017  Last Assessment & Plan: Formatting of this note might be different from the original. Today's BP: 142/93 borderline controlled - Continue lisinopril 20mg  daily and lasix 40mg  daily for lymphedema - Consider increasing lisinopril to 40 mg daily at follow-up. BP Readings from Last 3 Encounters: 11/16/17 142/93 08/09/17 132/85 07/13/17 137/80  Gastroenteritis 12/07/2017  Last Assessment & Plan: Can start azithromtycin 1000mg  x1 d/t severity of symptoms. Recommend BRAT diet and pushing fluids. Discussed and provided patient info and return  precautions. Will get stool studies if no improvement by beginning of next week.  Hashimoto's disease  History of abnormal cervical Pap smear 2/20 /2013  Endometrial cancer  History of cancer  Lymphedema 08/09/2017  Last Assessment & Plan: 2/2 R pelvic/inguinal LN dissection for endometrial cancer in 2013. Increase lasix 40mg  daily to BID qod for the next week. In the past, patient has had concerns about current dose losing its effectiveness. Continue compression stockings and routine elevation as well as weight loss (see tab). Provided home care instructions and return precautions.  Morbid obesity (CMS/HHS-HCC) 08/11/2017  Last Assessment & Plan: Formatting of this note might be different from the original. Discussed importance of diet, exercise, and weight loss in mitigating adverse health-related outcomes. Recommend the following: - Calorie counting and restriction. Pt reports 1200-1300/day on myfitnesspal. Asked her to be more diligent in counting. - Daily weight checks and weight loss goal of 0.5-1lb/week. - Ex  Osteoarthritis  Seborrheic keratosis 08/11/2017  Last Assessment & Plan: Extensive lesions on R medial ankle and desiring removal due to irritation with shaving. Recommend routine soaks and pumice stone. Will consider trial of cryo and/or derm referral on f/u visit. Provided home care instructions and return precautions.  Venous thromboembolism 2013  Clot after surgery   Past Surgical History: Past Surgical History:  Procedure Laterality Date  OTHER SURGERY 2023  intestinal surgery  COLONOSCOPY 05/19/2022  Fecal ImpactionNo Repeat/TKT  ileostomy removal 08/2022  Colon @ [facility] 08/05/2023  Normal examined colon/Repeat 87yrs/TKT  EGD @ [facility] 08/05/2023  Gastritis/No repeat/TKT  basil cell removal  Facial cells  CHOLECYSTECTOMY  FASCIECTOMY PLANTAR FASCIA Left  HEMORRHOIDECTOMY EXTERNAL  HERNIA REPAIR  umbilical  HYSTERECTOMY  OOPHORECTOMY  2013  TONSILLECTOMY  TUBAL  LIGATION 1986   Past Family History: Family History  Problem Relation Age of Onset  Arthritis Mother  High blood pressure (Hypertension) Mother  Skin cancer Mother  Arthritis Father  High blood pressure (Hypertension) Father  Scleroderma Daughter   Medications: Current Outpatient Medications  Medication Sig Dispense Refill  acetaminophen (TYLENOL ORAL) Take by mouth  albuterol 90 mcg/actuation inhaler Inhale 2 inhalations into the lungs every 6 (six) hours as needed  busPIRone (BUSPAR) 5 MG tablet Take 5 mg by mouth 2 (two) times daily  celecoxib (CELEBREX) 200 MG capsule Take 1 capsule (200 mg total) by mouth 2 (two) times daily 30 capsule 1  cetirizine (ZYRTEC) 10 MG tablet Take 1 tablet (10 mg total) by mouth once daily as needed  chlorthalidone 50 MG tablet Take 50 mg by mouth every morning (Patient not taking: Reported on 11/09/2023)  DULoxetine (CYMBALTA) 30 MG DR capsule Take 1 capsule by mouth 3 (three) times daily  dutasteride (AVODART) 0.5 mg capsule Take 0.5 mg by mouth once daily (Patient not taking: Reported on 11/09/2023)  folic acid (FOLVITE) 1 MG tablet Take 1 tablet (1 mg total) by mouth once daily 90 tablet 3  gabapentin (NEURONTIN) 100 MG capsule Take 1 capsule (100 mg total) by mouth at bedtime 30 capsule 5  hydrocortisone-pramoxine (ANALPRAM-HC) 2.5-1 % rectal cream PLACE RECTALLY 3 TIMES DAILY FOR 10 DAYS. 30 g 0  hydroxychloroquine (PLAQUENIL) 200 mg tablet Take 1 tablet (200 mg total) by mouth 2 (two) times daily 180 tablet 1  hydrOXYzine (ATARAX) 25 MG tablet Take 1 tablet (25 mg total) by mouth at bedtime as needed for Itching (Patient not taking: Reported on 11/09/2023) 90 tablet 0  lactulose (ENULOSE) 10 gram/15 mL oral solution TAKE 30 MLS BY MOUTH 2 (TWO) TIMES DAILY FOR 120 DAYS 5400 mL 1  lisinopril (PRINIVIL,ZESTRIL) 20 MG tablet Take 1 tablet by mouth once daily  methotrexate (RHEUMATREX) 2.5 MG tablet Take 5 tablets (12.5 mg total) by mouth every 7  (seven) days 60 tablet 1  multivitamin capsule Take 1 capsule by mouth once daily  omeprazole (PRILOSEC) 40 MG DR capsule Take 40 mg by mouth once daily  ondansetron (ZOFRAN) 4 MG tablet Take 4 mg by mouth every 8 (eight) hours as needed for Nausea  ondansetron (ZOFRAN) 8 MG tablet Take 8 mg by mouth every 8 (eight) hours as needed for Nausea  ondansetron (ZOFRAN-ODT) 8 MG disintegrating tablet Take 1 tablet (8 mg total) by mouth every 8 (eight) hours as needed for Nausea 60 tablet 5  pregabalin (LYRICA) 100 MG capsule Take 100 mg by mouth 2 (two) times daily  promethazine (PHENERGAN) 12.5 MG tablet Take 1 tablet (12.5 mg total) by mouth every 8 (eight) hours as needed for Nausea (Patient not taking: Reported on 11/09/2023) 30 tablet 1  sodium, potassium, and magnesium (SUPREP) oral solution Take 1 Bottle by mouth as directed One kit contains 2 bottles. Take both bottles at the times instructed by your provider. 354 mL 0  SUMAtriptan (IMITREX) 100 MG tablet Take 100 mg by mouth as directed for Migraine May take a second dose after 2 hours if needed.  triamcinolone 0.025 % cream Apply topically 2 (two) times daily  UNABLE TO FIND as needed Med Name: picot powder for nausea   No current facility-administered medications for this visit.   Allergies: Allergies  Allergen Reactions  Carboplatin Anaphylaxis  ANAPHYLACTIC REACTION  Carboplatin Anaphylaxis  ANAPHYLACTIC REACTION ANAPHYLACTIC REACTION  Dilaudid [  Hydromorphone] Anaphylaxis  Stopped breathing  Adhesive Tape-Silicones Itching and Rash  Nitrofurantoin Nausea And Vomiting and Nausea  Nitrofurantoin Monohyd/M-Cryst Nausea And Vomiting  Silicone Itching and Rash  Macrobid [Nitrofurantoin Monohyd/M-Cryst] Nausea  Wheat Bran Other (See Comments)  Other reaction(s): Other (See Comments) Body aches and swelling Body aches and swelling  Fentanyl Rash  Fentanyl Rash, Hives and Itching    Visit Vitals: Vitals:  11/09/23 1112  BP:  (!) 158/78    Review of Systems:  A comprehensive 14 point ROS was performed, reviewed, and the pertinent orthopaedic findings are documented in the HPI.  Physical Exam: Body mass index is 28.38 kg/m. General:  Well developed, well nourished, no apparent distress, normal affect, normal gait with no antalgic component.   HEENT: Head normocephalic, atraumatic, PERRL.   Abdomen: Soft, non tender, non distended, Bowel sounds present.  Heart: Examination of the heart reveals regular, rate, and rhythm. There is no murmur noted on ascultation. There is a normal apical pulse.  Lungs: Lungs are clear to auscultation. There is no wheeze, rhonchi, or crackles. There is normal expansion of bilateral chest walls.   Right hip exam  SKIN: intact, there is thickening of the skin over the groin crease on the right with scar tissue from her prior lower abdominal incision making it difficult to palpate bony landmarks. SWELLING: Diffuse swelling to the right lower extremity from the hip all the way to the foot minimal pitting per patient chronic from her lymphedema WARMTH: no warmth TENDERNESS: none, Stinchfield Positive ROM: 10 degrees internal rotation and 30 degrees external rotation and pain with internal rotation and hip flexion localized to the posterior hip and lateral aspect of the groin,; Hip Flexion 100 STRENGTH: normal GAIT: stiff-legged STABILITY: stable to testing CREPITUS: no LEG LENGTH DISCREPANCY: right longer by .3 cm NEUROLOGICAL EXAM: normal VASCULAR EXAM: normal LUMBAR SPINE: tenderness: no straight leg raising sign: no motor exam: normal  The contralateral hip was examined for comparison and it showed: TENDERNESS: none ROM: normal and full STRENGTH: normal STABILITY: stable to testing  Hip Imaging :  I reviewed AP pelvis and right hip lateral x-rays performed on 07/01/2023 images reviewed by myself. There are degenerative changes with inferior joint space narrowing  and medial joint space narrowing with a small cam deformity and there is also some superior acetabular sclerosis.  MRI of the pelvis without contrast performed on 12/04/2022 images and report reviewed by myself. There are moderate degenerative changes noted to the bilateral hips worse on the right with superior acetabular cartilage thinning and femoral head cartilage thinning. There is also inferior medial osteophyte formation on the femoral head and a subchondral cysts at the head neck junction superior laterally on the right hip. Consistent with moderate degenerative changes. No full-thickness defects are noted or any signs of avascular necrosis. Agree with radiology interpretation.  Lateral sit/stand x-rays of the lower spine and pelvis show sacral inclination motion from sitting to standing of 13 degrees showing a moderate decrease in spinal pelvic motion. Degenerative changes are noted to the lumbosacral spine with facet sclerosis and a grade 1 spondylolisthesis at L4-L5.  Assessment:  Right hip osteoarthritis  Plan: Elizabeth Martin is a 62 year old female who presents for history and physical for right posterior total hip arthroplasty with Dr. Audelia Acton on 11/14/2023. Patient has advanced right hip osteoarthritis confirmed on x-ray as well as MRI. She has had little to no relief with conservative treatment consisting of medications, physical therapy. She did have 4 days of complete relief  with a steroid injection back in September. Patient's pain is interfering with quality of life and activities daily living. Risks, benefits, complications of a right posterior total hip arthroplasty have been discussed with the patient. Patient has agreed and consented to the procedure with Dr. Audelia Acton on 11/14/2023.  The hospitalization and post-operative care and rehabilitation were also discussed. The use of perioperative antibiotics and DVT prophylaxis were discussed. The risk, benefits and alternatives to a surgical  intervention were discussed at length with the patient. The patient was also advised of risks related to the medical comorbidities and elevated body mass index (BMI). A lengthy discussion took place to review the most common complications including but not limited to: deep vein thrombosis, pulmonary embolus, heart attack, stroke, infection, wound breakdown, heterotopic ossification, dislocation, numbness, leg length in-equality, intraoperative fracture, damage to nerves, tendon,muscles, arteries or other blood vessels, death and other possible complications from anesthesia. The patient was told that we will take steps to minimize these risks by using sterile technique, antibiotics and DVT prophylaxis when appropriate and follow the patient postoperatively in the office setting to monitor progress. The possibility of recurrent pain, no improvement in pain and actual worsening of pain were also discussed with the patient. The risk of dislocation following total hip replacement was discussed and potential precautions to prevent dislocation were reviewed. We do specific on rotation about her young age and her increased risk of component wear and possible need for revision .  Patient had IVC filter placed without complication. Discussed using eliquis 2.5mg  bid for one month post op including discussion of risks and benefits of dvt prophylaxis.

## 2023-11-14 NOTE — Anesthesia Procedure Notes (Addendum)
Spinal  Patient location during procedure: OR Start time: 11/14/2023 1:25 PM End time: 11/14/2023 1:33 PM Reason for block: surgical anesthesia Staffing Performed: anesthesiologist  Anesthesiologist: Piscitello, Cleda Mccreedy, MD Resident/CRNA: Jossiah Smoak, [place], CRNA Performed by: Lotoya Casella, [place], CRNA Authorized by: Rosaria Ferries, MD   Preanesthetic Checklist Completed: patient identified, IV checked, site marked, risks and benefits discussed, surgical consent, monitors and equipment checked, pre-op evaluation and timeout performed Spinal Block Patient position: sitting Prep: Betadine Patient monitoring: heart rate, continuous pulse ox, blood pressure and cardiac monitor Approach: midline Location: L4-5 Injection technique: single-shot Needle Needle type: Whitacre and Introducer  Needle gauge: 24 G Needle length: 9 cm Assessment Events: CSF return Additional Notes Negative paresthesia. Negative blood return. Positive free-flowing CSF. Expiration date of kit checked and confirmed. Patient tolerated procedure well, without complications.

## 2023-11-14 NOTE — Transfer of Care (Signed)
Immediate Anesthesia Transfer of Care Note  Patient: Elizabeth Martin  Procedure(s) Performed: TOTAL HIP ARTHROPLASTY - posterior (Right: Hip)  Patient Location: PACU  Anesthesia Type:General  Level of Consciousness: awake  Airway & Oxygen Therapy: Patient Spontanous Breathing and Patient connected to face mask oxygen  Post-op Assessment: Report given to RN and Post -op Vital signs reviewed and stable  Post vital signs: Reviewed and stable  Last Vitals:  Vitals Value Taken Time  BP 126/73 11/14/23 1546  Temp 36.7 C 11/14/23 1546  Pulse 81 11/14/23 1551  Resp 23 11/14/23 1551  SpO2 100 % 11/14/23 1551  Vitals shown include unfiled device data.  Last Pain:  Vitals:   11/14/23 1546  TempSrc:   PainSc: Asleep         Complications: No notable events documented.

## 2023-11-14 NOTE — Anesthesia Preprocedure Evaluation (Signed)
Anesthesia Evaluation  Patient identified by MRN, date of birth, ID band Patient awake    Reviewed: Allergy & Precautions, NPO status , Patient's Chart, lab work & pertinent test results  History of Anesthesia Complications (+) PONV and history of anesthetic complications  Airway Mallampati: III  TM Distance: <3 FB Neck ROM: full    Dental  (+) Chipped   Pulmonary neg shortness of breath, asthma    Pulmonary exam normal        Cardiovascular hypertension, Normal cardiovascular exam     Neuro/Psych  Headaches PSYCHIATRIC DISORDERS       Neuromuscular disease    GI/Hepatic Neg liver ROS, hiatal hernia,GERD  Controlled,,  Endo/Other  negative endocrine ROSneg diabetes    Renal/GU      Musculoskeletal   Abdominal   Peds  Hematology negative hematology ROS (+)   Anesthesia Other Findings Past Medical History: No date: Anxiety No date: Asthma No date: Autoimmune disease (HCC) No date: Cancer [facility])     Comment:  Endometrial lymph node removal (30) 05/24/2022: Corneal abrasion, right No date: Depression No date: GERD (gastroesophageal reflux disease) No date: H/O blood clots     Comment:  upper rt groin and behind both knees No date: History of hiatal hernia No date: Hypertension No date: Lymphedema     Comment:  right leg 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 No date: PONV (postoperative nausea and vomiting)     Comment:  states gets violently ill  Past Surgical History: No date: BASAL CELL CARCINOMA EXCISION; Left 2008: CHOLECYSTECTOMY 05/19/2022: FLEXIBLE SIGMOIDOSCOPY; N/A     Comment:  Procedure: FLEXIBLE SIGMOIDOSCOPY;  Surgeon: Toledo,               Boykin Nearing, MD;  Location: [facility] ENDOSCOPY;  Service:               Gastroenterology;  Laterality: N/A; 2004: HERNIA REPAIR 08/24/2022: ILEOSTOMY CLOSURE; N/A     Comment:  Procedure: ILEOSTOMY TAKEDOWN, open loop with Lynden Oxford, PA-C to assist;   Surgeon: Henrene Dodge, MD;                Location: [facility] ORS;  Service: General;  Laterality: N/A; 11/09/2023: IVC FILTER INSERTION; N/A     Comment:  Procedure: IVC FILTER INSERTION;  Surgeon: Annice Needy,              MD;  Location: [facility] INVASIVE CV LAB;  Service:               Cardiovascular;  Laterality: N/A; No date: PLANTAR FASCIECTOMY 02/14/2012: ROBOTIC ASSISTED TOTAL HYSTERECTOMY WITH BILATERAL  SALPINGO OOPHERECTOMY 03/24/2022: ROTATOR CUFF REPAIR; Left No date: TONSILLECTOMY 05/24/2022: XI ROBOTIC ASSISTED LOWER ANTERIOR RESECTION; N/A     Comment:  Procedure: XI ROBOTIC ASSISTED LOWER ANTERIOR RESECTION;              Surgeon: Henrene Dodge, MD;  Location: [facility] ORS;                Service: General;  Laterality: N/A;     Reproductive/Obstetrics negative OB ROS                             Anesthesia Physical Anesthesia Plan  ASA: 3  Anesthesia Plan: Spinal   Post-op Pain Management:    Induction:   PONV Risk Score and Plan:   Airway  Management Planned: Natural Airway and Nasal Cannula  Additional Equipment:   Intra-op Plan:   Post-operative Plan:   Informed Consent: I have reviewed the patients History and Physical, chart, labs and discussed the procedure including the risks, benefits and alternatives for the proposed anesthesia with the patient or authorized representative who has indicated his/her understanding and acceptance.     Dental Advisory Given  Plan Discussed with: Anesthesiologist, CRNA and Surgeon  Anesthesia Plan Comments: (Patient reports no bleeding problems and no anticoagulant use.  Plan for spinal with backup GA  Patient consented for risks of anesthesia including but not limited to:  - adverse reactions to medications - damage to eyes, teeth, lips or other oral mucosa - nerve damage due to positioning  - risk of bleeding, infection and or nerve damage from spinal that could lead to paralysis - risk of  headache or failed spinal - damage to teeth, lips or other oral mucosa - sore throat or hoarseness - damage to heart, brain, nerves, lungs, other parts of body or loss of life  Patient voiced understanding and assent.)       Anesthesia Quick Evaluation

## 2023-11-14 NOTE — Interval H&P Note (Signed)
Patient history and physical updated. Consent reviewed including risks, benefits, and alternatives to surgery. Patient agrees with above plan to proceed with right posterior total hip arthroplasty.

## 2023-11-14 NOTE — Op Note (Signed)
Patient Name: Elizabeth Martin  UEA:[id number]  Pre-Operative Diagnosis: Right hip Osteoarthritis  Post-Operative Diagnosis: (same)  Procedure: Right Total Hip Arthroplasty  Components/Implants: Cup: Trident Tritanium Clusterhole 59mm/D    Liner: MDM 38/D  Stem: Insignia #5 std offset  Head:LFIT v40 22.2 +0 with MDM X3 22.2/38D  Date of Surgery: 11/14/2023  Surgeon: Reinaldo Berber MD  Assistant: Amador Cunas PA (present and scrubbed throughout the case, critical for assistance with exposure, retraction, instrumentation, and closure)   Anesthesiologist: Piscitello  Anesthesia: Spinal  IVF:600cc  EBL: 200cc  Complications: None   Brief history: The patient is a 62 year old female with a history of osteoarthritis of the right hip with pain limiting their range of motion and activities of daily living, which has failed multiple attempts at conservative therapy.  The risks and benefits of total hip arthroplasty as definitive surgical treatment were discussed with the patient, who opted to proceed with the operation.  After outpatient medical clearance and optimization was completed the patient was admitted to [facility] for the procedure.  All preoperative films were reviewed and an appropriate surgical plan was made prior to surgery.   Description of procedure: The patient was brought to the operating room where laterality was confirmed by all those present to be the right side.  The patient was moved to the table and administered spinal anesthesia. Patient was given an intravenous dose of antibiotics for surgical prophylaxis and TXA. The patient was positioned in lateral decubitus position with all bony prominences well-padded.  Surgical site was prepped with alcohol and chlorhexidine.  Surgical site over the hip was draped in typical sterile fashion with multiple layers of adhesive and nonadhesive drapes.  The incision site over the greater trochanter posteriorly was  marked out with a sterile marker.   Surgical timeout was then called with participation of all staff in the room the patient was confirmed and laterality again confirmed.  An incision was made over the lateral aspect of the hip cheating posteriorly on the proximal aspect.  Careful soft tissue dissection and coagulation of all bleeders was carried out down to the level of the glut max fascia.  The fascia was carefully incised in line with the femur.  A Charnley retractor was placed deep to the fascia with care taken to ensure that there was no nerve entrapment in the retractor.  The bursa tissue was taken down over the posterior femur exposing the external rotators.  A dull Cobra retractor was placed under the abductor mechanism to protect the mechanism and fully expose the piriformis and short external rotators.  The external rotators were carefully detached from the femur with electrocautery and tagged with Ethibond sutures.  The capsule to the hip was incised and tagged with sutures.  The femur was then carefully dislocated and cobra retractors were placed superior and inferior to the femoral neck.  The x-ray template was reviewed and electrocautery was used to mark out the femoral neck resection.   After the femoral neck and head were resected attention was then turned to the acetabulum.  An anterior acetabular retractor was placed and posterior retractor was placed to fully visualize the acetabulum.  The labrum was removed with electrocautery and the pulnivar was removed.  The acetabulum was then reamed sequentially up to a size 50mm cup which allowed for good bony coverage and stability.  The size 50mm acetabular component was then opened and implanted.  A drill was used to carefully place the 2 screws into the acetabular  component.  The cup was irrigated and cleared of debris, a real MDM liner was placed, impacted and checked for stability.   Attention was turned back to the femur and a femoral neck  retractor was placed.  The femur was opened with a box osteotome and canal finder.  The femur was then sequentially broached up to a size 5 broach which allowed for good fit and fill.  A calcar planer was then used to smooth out the calcar.  A trial neck and head were then attached and the hip was reduced.  The hip was found to be stable on reduction with full range of motion without subluxation or dislocation and leg lengths felt equal.  The hip was then carefully dislocated head and neck trial was removed.    The femur was then exposed the broach was removed the canal was irrigated and a real size 5 std femoral implant was impacted into place.  The real ball was then placed on the femoral component.  The acetabulum was irrigated and the hip was reduced.  The hip showed good range of motion and stability on testing with both stability in flexion internal rotation and extension external rotation.  The hip was then irrigated with betadine based surgiphor solution and pulsatile lavage with saline.  A 2 mm drill bit was then used to make 2 holes in the greater trochanter the piriformis and capsular tissues were reapproximated and passed through the drill holes and tied over the greater trochanter.  The fascia was then approximated with #1 Vicryl and #1 barbed suture.  The subcutaneous tissues and skin were closed with 0 Vicryl 2-0 Vicryl and 4-0 barbed monofilament suture and the skin closed with Dermabond.  A sterile dressing was then applied.  Lap, sharps, and sponge counts were correct at the end of the case.   The patient was then rolled supine and an x-ray was taken in the operating room. Leg lengths were clinically equal on examination with a good distal pulse. Components appeared in good position with no fractures noted on x-ray.  Patient was then transferred to a hospital bed and transferred to the recovery room in stable condition.

## 2023-11-15 ENCOUNTER — Encounter: Payer: Self-pay | Admitting: Orthopedic Surgery

## 2023-11-15 DIAGNOSIS — M1611 Unilateral primary osteoarthritis, right hip: Secondary | ICD-10-CM | POA: Diagnosis not present

## 2023-11-15 LAB — BASIC METABOLIC PANEL
Anion gap: 10 (ref 5–15)
BUN: 10 mg/dL (ref 8–23)
CO2: 24 mmol/L (ref 22–32)
Calcium: 8.2 mg/dL — ABNORMAL LOW (ref 8.9–10.3)
Chloride: 105 mmol/L (ref 98–111)
Creatinine, Ser: 0.76 mg/dL (ref 0.44–1.00)
GFR, Estimated: >60 mL/min (ref >60)
Glucose, Bld: 140 mg/dL — ABNORMAL HIGH (ref 70–99)
Potassium: 4.2 mmol/L (ref 3.5–5.1)
Sodium: 139 mmol/L (ref 135–145)

## 2023-11-15 LAB — CBC
HCT: 31.6 % — ABNORMAL LOW (ref 36.0–46.0)
Hemoglobin: 10.5 g/dL — ABNORMAL LOW (ref 12.0–15.0)
MCH: 29.8 pg (ref 26.0–34.0)
MCHC: 33.2 g/dL (ref 30.0–36.0)
MCV: 89.8 fL (ref 80.0–100.0)
Platelets: 232 10*3/uL (ref 150–400)
RBC: 3.52 MIL/uL — ABNORMAL LOW (ref 3.87–5.11)
RDW: 14.9 % (ref 11.5–15.5)
WBC: 14 10*3/uL — ABNORMAL HIGH (ref 4.0–10.5)
nRBC: 0 % (ref 0.0–0.2)

## 2023-11-15 MED ORDER — OXYCODONE HCL 5 MG PO TABS: 2.5000 mg | ORAL_TABLET | ORAL | 0 refills | Status: DC | PRN

## 2023-11-15 MED ORDER — OXYCODONE HCL 5 MG PO TABS
ORAL_TABLET | ORAL | Status: AC
  Filled 2023-11-15: qty 1

## 2023-11-15 MED ORDER — CEFAZOLIN SODIUM-DEXTROSE 2-4 GM/100ML-% IV SOLN
INTRAVENOUS | Status: AC
  Filled 2023-11-15: qty 100

## 2023-11-15 MED ORDER — ACETAMINOPHEN 500 MG PO TABS
ORAL_TABLET | ORAL | Status: AC
Start: 2023-11-15 — End: ?
  Filled 2023-11-15: qty 2

## 2023-11-15 MED ORDER — LISINOPRIL 20 MG PO TABS
ORAL_TABLET | ORAL | Status: AC
  Filled 2023-11-15: qty 2

## 2023-11-15 MED ORDER — KETOROLAC TROMETHAMINE 15 MG/ML IJ SOLN
INTRAMUSCULAR | Status: AC
  Filled 2023-11-15: qty 1

## 2023-11-15 MED ORDER — ORAL CARE MOUTH RINSE: 15.0000 mL | OROMUCOSAL | Status: DC | PRN

## 2023-11-15 MED ORDER — CELECOXIB 200 MG PO CAPS: 200.0000 mg | ORAL_CAPSULE | Freq: Every day | ORAL | Status: DC

## 2023-11-15 MED ORDER — LORATADINE 10 MG PO TABS
ORAL_TABLET | ORAL | Status: AC
  Filled 2023-11-15: qty 1

## 2023-11-15 MED ORDER — TRAMADOL HCL 50 MG PO TABS
ORAL_TABLET | ORAL | Status: AC
  Filled 2023-11-15: qty 1

## 2023-11-15 MED ORDER — APIXABAN 2.5 MG PO TABS
ORAL_TABLET | ORAL | Status: AC
Start: 2023-11-15 — End: ?
  Filled 2023-11-15: qty 1

## 2023-11-15 MED ORDER — ACETAMINOPHEN 500 MG PO TABS: 1000.0000 mg | ORAL_TABLET | Freq: Three times a day (TID) | ORAL | 0 refills | Status: AC

## 2023-11-15 MED ORDER — APIXABAN 2.5 MG PO TABS: 2.5000 mg | ORAL_TABLET | Freq: Two times a day (BID) | ORAL | 0 refills | Status: DC

## 2023-11-15 MED ORDER — DOCUSATE SODIUM 100 MG PO CAPS
ORAL_CAPSULE | ORAL | Status: AC
Start: 2023-11-15 — End: ?
  Filled 2023-11-15: qty 1

## 2023-11-15 MED ORDER — DOCUSATE SODIUM 100 MG PO CAPS: 100.0000 mg | ORAL_CAPSULE | Freq: Two times a day (BID) | ORAL | 0 refills | Status: DC

## 2023-11-15 MED ORDER — TRAMADOL HCL 50 MG PO TABS: 50.0000 mg | ORAL_TABLET | Freq: Four times a day (QID) | ORAL | 0 refills | Status: DC | PRN

## 2023-11-15 MED ORDER — ONDANSETRON HCL 4 MG PO TABS: 4.0000 mg | ORAL_TABLET | Freq: Four times a day (QID) | ORAL | 0 refills | Status: DC | PRN

## 2023-11-15 NOTE — Plan of Care (Signed)
?  Problem: Clinical Measurements: ?Goal: Respiratory complications will improve ?Outcome: Progressing ?  ?Problem: Activity: ?Goal: Risk for activity intolerance will decrease ?Outcome: Progressing ?  ?Problem: Coping: ?Goal: Level of anxiety will decrease ?Outcome: Progressing ?  ?Problem: Elimination: ?Goal: Will not experience complications related to urinary retention ?Outcome: Progressing ?  ?

## 2023-11-15 NOTE — Discharge Instructions (Signed)
Instructions after Posterior Total Hip Replacement        Dr. Regenia Skeeter., M.D.      Dept. of Orthopaedics & Sports Medicine  [facility]  [address]  [place], [place]  [address]  Phone: [phone]   Fax: [phone]    DIET: Drink plenty of non-alcoholic fluids. Resume your normal diet. Include foods high in fiber.  ACTIVITY:  You may use crutches or a walker with weight-bearing as tolerated, unless instructed otherwise. You may be weaned off of the walker or crutches by your Physical Therapist.  Do NOT reach below the level of your knees or cross your legs until allowed.    Continue doing gentle exercises. Exercising will reduce the pain and swelling, increase motion, and prevent muscle weakness.   Please continue to use the TED compression stockings for 2 weeks. You may remove the stockings at night, but should reapply them in the morning. Do not drive or operate any equipment until instructed.  WOUND CARE:  Continue to use ice packs periodically to reduce pain and swelling. You may shower with honeycomb dressing 3 days after surgery. Do not submerge incision site under water. Remove honeycomb dressing 7 days after surgery and allow dermabond to fall off on its own.   MEDICATIONS: You may resume your regular medications. Please take the pain medication as prescribed on the medication list. Do not take pain medication on an empty stomach. You have been given a prescription for a blood thinner to prevent blood clots. Please take the medication as instructed. Pain medications and iron supplements can cause constipation. Use a stool softener (Senokot or Colace) on a daily basis and a laxative (dulcolax or miralax) as needed. Do not drive or drink alcoholic beverages when taking pain medications.   POSTOPERATIVE CONSTIPATION PROTOCOL Constipation - defined medically as fewer than three stools per week and severe constipation as less than one stool per  week.  One of the most common issues patients have following surgery is constipation.  Even if you have a regular bowel pattern at home, your normal regimen is likely to be disrupted due to multiple reasons following surgery.  Combination of anesthesia, postoperative narcotics, change in appetite and fluid intake all can affect your bowels.  In order to avoid complications following surgery, here are some recommendations in order to help you during your recovery period.  Colace (docusate) - Pick up an over-the-counter form of Colace or another stool softener and take twice a day as long as you are requiring postoperative pain medications.  Take with a full glass of water daily.  If you experience loose stools or diarrhea, hold the colace until you stool forms back up.  If your symptoms do not get better within 1 week or if they get worse, check with your doctor.  Dulcolax (bisacodyl) - Pick up over-the-counter and take as directed by the product packaging as needed to assist with the movement of your bowels.  Take with a full glass of water.  Use this product as needed if not relieved by Colace only.   MiraLax (polyethylene glycol) - Pick up over-the-counter to have on hand.  MiraLax is a solution that will increase the amount of water in your bowels to assist with bowel movements.  Take as directed and can mix with a glass of water, juice, soda, coffee, or tea.  Take if you go more than two days without a movement. Do not use MiraLax more than once per day. Call  your doctor if you are still constipated or irregular after using this medication for 7 days in a row.  If you continue to have problems with postoperative constipation, please contact the office for further assistance and recommendations.  If you experience "the worst abdominal pain ever" or develop nausea or vomiting, please contact the office immediatly for further recommendations for treatment.   CALL THE OFFICE FOR: Temperature above 101  degrees Excessive bleeding or drainage on the dressing. Excessive swelling, coldness, or paleness of the toes. Persistent nausea and vomiting.  FOLLOW-UP:  You should have an appointment to return to the office in 2 weeks after surgery. Arrangements have been made for continuation of Physical Therapy (either home therapy or outpatient therapy).

## 2023-11-15 NOTE — Evaluation (Signed)
Occupational Therapy Evaluation Patient Details Name: Elizabeth Martin MRN: [id number] DOB: 11-17-1961 Today's Date: 11/15/2023   History of Present Illness Pt is a 62 yo female s/p R THA. PMH of asthma, cancer, depression, GERD, HTN, lymphedema, morphea scleroderma, blood clots and IVC filter placed prior to surgery.   Clinical Impression   Elizabeth Martin was seen for OT evaluation this date. Prior to hospital admission, pt was IND. Pt lives with spouse. Pt currently requires MIN A don underwear and pants, assist to maintain posterior hip pcns. SUPERVISION + RW for toilet t/f and standing grooming. MAX A don compression socks at bed level. Pt instructed in falls prevention strategies, DME recs, functional application of posterior hip pcns, and compression stocking mgt. Handout provided. All education complete, will sign off. Upon hospital discharge, recommend no OT follow up.     If plan is discharge home, recommend the following: Help with stairs or ramp for entrance    Functional Status Assessment  Patient has had a recent decline in their functional status and demonstrates the ability to make significant improvements in function in a reasonable and predictable amount of time.  Equipment Recommendations  None recommended by OT    Recommendations for Other Services       Precautions / Restrictions Precautions Precautions: Posterior Hip Precaution Booklet Issued: Yes (comment) Restrictions Weight Bearing Restrictions: Yes RLE Weight Bearing: Weight bearing as tolerated      Mobility Bed Mobility Overal bed mobility: Modified Independent                  Transfers Overall transfer level: Modified independent Equipment used: Rolling walker (2 wheels)                      Balance Overall balance assessment: No apparent balance deficits (not formally assessed)                                         ADL either performed or assessed with clinical  judgement   ADL Overall ADL's : Needs assistance/impaired                                       General ADL Comments: MIN A don underwear and pants, assist for posterior hip pcns. SUPERVISION + RW for toilet t/f and standing grooming. MAX A don compression socks at bed level.      Pertinent Vitals/Pain Pain Assessment Pain Assessment: 0-10 Pain Score: 7  Pain Location: R hip Pain Descriptors / Indicators: Grimacing, Operative site guarding Pain Intervention(s): Limited activity within patient's tolerance, Premedicated before session, Patient requesting pain meds-RN notified     Extremity/Trunk Assessment Upper Extremity Assessment Upper Extremity Assessment: Overall WFL for tasks assessed   Lower Extremity Assessment Lower Extremity Assessment: RLE deficits/detail RLE Deficits / Details: edematous (hx of lymphodema)       Communication Communication Communication: No apparent difficulties   Cognition Arousal: Alert Behavior During Therapy: WFL for tasks assessed/performed Overall Cognitive Status: Within Functional Limits for tasks assessed  Home Living Family/patient expects to be discharged to:: Private residence Living Arrangements: Spouse/significant other Available Help at Discharge: Family;Available 24 hours/day Type of Home: House Home Access: Level entry     Home Layout: One level     Bathroom Shower/Tub: Producer, television/film/video: Standard     Home Equipment: Agricultural consultant (2 wheels);BSC/3in1          Prior Functioning/Environment Prior Level of Function : Independent/Modified Independent                        OT Problem List: Pain         OT Goals(Current goals can be found in the care plan section) Acute Rehab OT Goals Patient Stated Goal: to go home OT Goal Formulation: With patient/family Time For Goal Achievement:  11/15/23 Potential to Achieve Goals: Good   AM-PAC OT "6 Clicks" Daily Activity     Outcome Measure Help from another person eating meals?: None Help from another person taking care of personal grooming?: None Help from another person toileting, which includes using toliet, bedpan, or urinal?: A Little Help from another person bathing (including washing, rinsing, drying)?: A Little Help from another person to put on and taking off regular upper body clothing?: None Help from another person to put on and taking off regular lower body clothing?: A Little 6 Click Score: 21   End of Session Equipment Utilized During Treatment: Rolling walker (2 wheels) Nurse Communication: Patient requests pain meds  Activity Tolerance: Patient tolerated treatment well Patient left:  (standing with PT)  OT Visit Diagnosis: Other abnormalities of gait and mobility (R26.89)                Time: 1610-9604 OT Time Calculation (min): 22 min Charges:  OT General Charges $OT Visit: 1 Visit OT Evaluation $OT Eval Low Complexity: 1 Low OT Treatments $Self Care/Home Management : 8-22 mins  Kathie Dike, M.S. OTR/L  11/15/23, 11:45 AM  ascom [phone]

## 2023-11-15 NOTE — Evaluation (Signed)
Physical Therapy Evaluation Patient Details Name: Elizabeth Martin MRN: [id number] DOB: 12-Mar-1961 Today's Date: 11/15/2023  History of Present Illness  Pt is a 62 yo female s/p R THA. PMH of asthma, cancer, depression, GERD, HTN, lymphedema, morphea scleroderma, blood clots and IVC filter placed prior to surgery.   Clinical Impression  Pt alert, oriented x4, up with OT upon PT entrance. The patient was able to ambulate ~29ft but exhibited antalgic gait, decreased velocity and needed verbal cueing for RW positioning (tended to lift when stepping). CGA throughout activities for safety. PT/pt reviewed car transfers, use of HEP packet, and posterior hip precautions (able to recall 3/3 from OT education).  Overall the patient demonstrated deficits (see "PT Problem List") that impede the patient's functional abilities, safety, and mobility and would benefit from skilled PT intervention.          If plan is discharge home, recommend the following: A little help with bathing/dressing/bathroom;Assistance with cooking/housework;Assist for transportation;Help with stairs or ramp for entrance   Can travel by private vehicle        Equipment Recommendations None recommended by PT  Recommendations for Other Services       Functional Status Assessment Patient has had a recent decline in their functional status and demonstrates the ability to make significant improvements in function in a reasonable and predictable amount of time.     Precautions / Restrictions Precautions Precautions: Posterior Hip Precaution Booklet Issued: Yes (comment) Restrictions Weight Bearing Restrictions: Yes RLE Weight Bearing: Weight bearing as tolerated      Mobility  Bed Mobility               General bed mobility comments: pt up with OT upon PT entrance    Transfers Overall transfer level: Modified independent Equipment used: Rolling walker (2 wheels)                     Ambulation/Gait Ambulation/Gait assistance: Contact guard assist Gait Distance (Feet): 70 Feet Assistive device: Rolling walker (2 wheels)         General Gait Details: antalgic, step to gait pattern, cued for RW positioning and to avoid lifting it  Stairs            Wheelchair Mobility     Tilt Bed    Modified Rankin (Stroke Patients Only)       Balance Overall balance assessment: Needs assistance Sitting-balance support: Feet supported Sitting balance-Leahy Scale: Good     Standing balance support: Bilateral upper extremity supported Standing balance-Leahy Scale: Fair                               Pertinent Vitals/Pain Pain Assessment Pain Assessment: 0-10 Pain Score: 7  Pain Location: R hip Pain Descriptors / Indicators: Grimacing, Operative site guarding, Sore Pain Intervention(s): Limited activity within patient's tolerance, Monitored during session, Repositioned, Patient requesting pain meds-RN notified    Home Living Family/patient expects to be discharged to:: Private residence Living Arrangements: Spouse/significant other Available Help at Discharge: Family;Available 24 hours/day Type of Home: House Home Access: Level entry       Home Layout: One level Home Equipment: Agricultural consultant (2 wheels);BSC/3in1      Prior Function Prior Level of Function : Independent/Modified Independent                     Extremity/Trunk Assessment   Upper Extremity Assessment Upper Extremity Assessment: Defer to  OT evaluation    Lower Extremity Assessment Lower Extremity Assessment: RLE deficits/detail RLE Deficits / Details: edematous (hx of lymphodema)       Communication   Communication Communication: No apparent difficulties  Cognition Arousal: Alert Behavior During Therapy: WFL for tasks assessed/performed Overall Cognitive Status: Within Functional Limits for tasks assessed                                           General Comments      Exercises     Assessment/Plan    PT Assessment Patient needs continued PT services  PT Problem List Decreased strength;Pain;Decreased range of motion;Decreased activity tolerance;Decreased balance;Decreased mobility;Decreased knowledge of precautions;Decreased knowledge of use of DME       PT Treatment Interventions DME instruction;Neuromuscular re-education;Gait training;Stair training;Functional mobility training;Patient/family education;Therapeutic activities;Therapeutic exercise;Balance training    PT Goals (Current goals can be found in the Care Plan section)  Acute Rehab PT Goals Patient Stated Goal: to return to PLOF PT Goal Formulation: With patient Time For Goal Achievement: 11/29/23 Potential to Achieve Goals: Good    Frequency BID     Co-evaluation               AM-PAC PT "6 Clicks" Mobility  Outcome Measure Help needed turning from your back to your side while in a flat bed without using bedrails?: A Little Help needed moving from lying on your back to sitting on the side of a flat bed without using bedrails?: A Little Help needed moving to and from a bed to a chair (including a wheelchair)?: A Little Help needed standing up from a chair using your arms (e.g., wheelchair or bedside chair)?: A Little Help needed to walk in hospital room?: A Little Help needed climbing 3-5 steps with a railing? : A Little 6 Click Score: 18    End of Session Equipment Utilized During Treatment: Gait belt Activity Tolerance: Patient tolerated treatment well;Patient limited by pain Patient left: in chair;with family/visitor present Nurse Communication: Mobility status PT Visit Diagnosis: Other abnormalities of gait and mobility (R26.89);Difficulty in walking, not elsewhere classified (R26.2);Pain;Muscle weakness (generalized) (M62.81) Pain - Right/Left: Right Pain - part of body: Hip    Time: 4098-1191 PT Time Calculation (min) (ACUTE  ONLY): 28 min   Charges:   PT Evaluation $PT Eval Low Complexity: 1 Low PT Treatments $Gait Training: 8-22 mins $Therapeutic Activity: 8-22 mins PT General Charges $$ ACUTE PT VISIT: 1 Visit         Olga Coaster PT, DPT 11:53 AM,11/15/23

## 2023-11-15 NOTE — Discharge Summary (Signed)
Physician Discharge Summary  Patient ID: Elizabeth Martin MRN: [id number] DOB/AGE: 1960-12-30 62 y.o.  Admit date: 11/14/2023 Discharge date: 11/15/2023  Admission Diagnoses:  S/P total right hip arthroplasty [D66.440]   Discharge Diagnoses: Patient Active Problem List   Diagnosis Date Noted   S/P total right hip arthroplasty 11/14/2023   Blood coagulation disorder (HCC) 11/10/2023   PTSD (post-traumatic stress disorder) 11/10/2023   Severe episode of recurrent major depressive disorder, without psychotic features (HCC) 11/10/2023   GAD (generalized anxiety disorder) 11/10/2023   High risk medication use 11/10/2023   Anxiety and depression 03/22/2023   Cervical radiculopathy 03/16/2023   Colon cancer screening 03/15/2023   Chronic idiopathic constipation 03/15/2023   Dyspnea 03/07/2023   Rocky Mountain spotted fever 03/07/2023   Strain of gastrocnemius tendon 03/07/2023   Lower abdominal pain 03/07/2023   Pain in pelvis 03/07/2023   Small bowel obstruction (HCC) 02/11/2023   Raynaud disease 02/11/2023   Polyarthralgia 02/11/2023   Edema 01/03/2023   Diverticular disease 12/10/2022   Hot flashes 12/10/2022   Pain of right lower extremity 12/10/2022   Paresthesia of lower extremity 12/10/2022   Primary insomnia 12/10/2022   Thyroid nodule 12/10/2022   Multiple joint pain 12/10/2022   Basal cell carcinoma of skin 12/10/2022   Acute bilateral low back pain without sciatica 11/10/2022   Chronic lower back pain 09/05/2022   S/P closure of ileostomy 08/24/2022   Ileostomy in place [facility])    Morphea 07/13/2022   Colonic stricture (HCC) 05/19/2022   Large bowel obstruction (HCC) 05/19/2022   Hypokalemia 05/18/2022   Abdominal pain 05/18/2022   Osteoarthritis of left knee 02/24/2022   Mixed connective tissue disease (HCC) 02/17/2022   Adhesive capsulitis of left shoulder 01/27/2022   Glenohumeral arthritis, left 01/27/2022   Microscopic hematuria 01/12/2022   Otorrhagia of  right ear 12/10/2021   Elevated testosterone level in female 11/04/2021   Anti-TPO antibodies present 10/20/2021   Rash 10/13/2021   Hashimoto thyroiditis, 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   Hashimoto's disease 03/23/2021   Exposure to severe acute respiratory syndrome coronavirus 2 (SARS-CoV-2) 12/19/2020   Migraine without aura and without status migrainosus, not intractable 08/19/2020   Myofascial pain 07/30/2020   Neck pain 07/30/2020   Upper back pain 07/30/2020   Cervical facet joint syndrome 07/30/2020   Hair loss 07/14/2020   Edema of lower extremity 07/11/2020   Adenomatous colon polyp 02/26/2020   Gastroesophageal reflux disease 02/15/2020   Diarrhea 02/15/2020   Sleep disorder 01/16/2020   Hyperlipidemia 07/11/2019   Right shoulder pain 04/04/2019   Pre-operative clearance 12/13/2018   Endometrial cancer (HCC) 04/05/2018   Patellofemoral pain syndrome of right knee 03/20/2018   Gastroenteritis 12/07/2017   Fatigue 11/16/2017   Morbid obesity (HCC) 08/11/2017   Stucco keratoses 08/11/2017   Essential hypertension 08/09/2017   History of endometrial cancer 08/09/2017   Lymphedema 08/09/2017   Menopause 08/09/2017   Malignant neoplastic disease (HCC) 04/07/2017   Cellulitis 09/18/2016   Benign hypertension 06/02/2016   Asthma 06/04/1969    Past Medical History:  Diagnosis Date   Anxiety    Asthma    Autoimmune disease (HCC)    Cancer (HCC)    Endometrial lymph node removal (30)   Corneal abrasion, right 05/24/2022   Depression    GERD (gastroesophageal reflux disease)    H/O blood clots    upper rt groin and behind both knees   History of hiatal hernia    Hypertension    Lymphedema    right leg  Morphea scleroderma    PONV (postoperative nausea and vomiting)    states gets violently ill     Transfusion: none   Consultants (if any):   Discharged Condition: Improved  Hospital Course: Elizabeth Martin is an 62 y.o. female who was admitted 11/14/2023 with a  diagnosis of S/P total right hip arthroplasty and went to the operating room on 11/14/2023 and underwent the above named procedures.    Surgeries: Procedure(s): TOTAL HIP ARTHROPLASTY - posterior on 11/14/2023 Patient tolerated the surgery well. Taken to PACU where she was stabilized and then transferred to the orthopedic floor.  Started on Eliquis 2.5 mg BID, TEDs and SCDs applied bilaterally. Heels elevated on bed. No evidence of DVT. Negative Homan. Physical therapy started on day #1 for gait training and transfer. OT started day #1 for ADL and assisted devices.  Patient's IV was d/c on day #1. Patient was able to safely and independently complete all PT goals. PT recommending discharge to home.    On post op day #1 patient was stable and ready for discharge to home with HHPT.  Implants:  Cup: Trident Tritanium Clusterhole 77mm/D    Liner: MDM 38/D  Stem: Insignia #5 std offset  Head:LFIT v40 22.2 +0 with MDM X3 22.2/38D    She was given perioperative antibiotics:  Anti-infectives (From admission, onward)    Start     Dose/Rate Route Frequency Ordered Stop   11/14/23 2200  hydroxychloroquine (PLAQUENIL) tablet 200 mg        200 mg Oral 2 times daily 11/14/23 1739     11/14/23 2000  ceFAZolin (ANCEF) IVPB 2g/100 mL premix        2 g 200 mL/hr over 30 Minutes Intravenous Every 6 hours 11/14/23 1739 11/15/23 0257   11/14/23 0600  ceFAZolin (ANCEF) IVPB 2g/100 mL premix        2 g 200 mL/hr over 30 Minutes Intravenous On call to O.R. 11/13/23 2319 11/14/23 1404     .  She was given sequential compression devices, early ambulation, and Eliquis TEDs for DVT prophylaxis.  She benefited maximally from the hospital stay and there were no complications.    Recent vital signs:  Vitals:   11/15/23 0624 11/15/23 0804  BP: 111/60 124/67  Pulse: 72 73  Resp: 16 15  Temp: 98 F (36.7 C) 98.2 F (36.8 C)  SpO2: 99% 96%    Recent laboratory studies:  Lab Results  Component Value Date    HGB 10.5 (L) 11/15/2023   HGB 13.4 11/01/2023   HGB 11.9 (L) 02/12/2023   Lab Results  Component Value Date   WBC 14.0 (H) 11/15/2023   PLT 232 11/15/2023   Lab Results  Component Value Date   INR 1.0 05/23/2022   Lab Results  Component Value Date   NA 139 11/15/2023   K 4.2 11/15/2023   CL 105 11/15/2023   CO2 24 11/15/2023   BUN 10 11/15/2023   CREATININE 0.76 11/15/2023   GLUCOSE 140 (H) 11/15/2023    Discharge Medications:   Allergies as of 11/15/2023       Reactions   Carboplatin Anaphylaxis      Hydromorphone Anaphylaxis   Stopped breathing Respiratory arrest - due to dosage administered per the patient.   Latex Hives, Itching, Rash   Nitrofurantoin Nausea And Vomiting   Silicone Itching, Rash   Wheat Other (See Comments)   Body aches and swelling        Medication List  TAKE these medications    acetaminophen 500 MG tablet Commonly known as: TYLENOL Take 2 tablets (1,000 mg total) by mouth every 8 (eight) hours.   albuterol 108 (90 Base) MCG/ACT inhaler Commonly known as: VENTOLIN HFA Inhale 2 puffs into the lungs every 6 (six) hours as needed for shortness of breath or wheezing.   apixaban 2.5 MG Tabs tablet Commonly known as: ELIQUIS Take 1 tablet (2.5 mg total) by mouth every 12 (twelve) hours.   buPROPion 75 MG tablet Commonly known as: WELLBUTRIN Take 1 tablet (75 mg total) by mouth in the morning.   busPIRone 10 MG tablet Commonly known as: BUSPAR Take 10 mg by mouth 2 (two) times daily.   celecoxib 200 MG capsule Commonly known as: CELEBREX Take 1 capsule (200 mg total) by mouth daily. What changed: when to take this   cetirizine 10 MG tablet Commonly known as: ZYRTEC Take 10 mg by mouth daily as needed for allergies.   Clobetasol Propionate E 0.05 % emollient cream Generic drug: Clobetasol Prop Emollient Base Apply topically.   desonide 0.05 % cream Commonly known as: DESOWEN Apply 1 Application topically 2 (two)  times daily as needed (morphea flare).   docusate sodium 100 MG capsule Commonly known as: COLACE Take 1 capsule (100 mg total) by mouth 2 (two) times daily.   DULoxetine 30 MG capsule Commonly known as: CYMBALTA Take 1 capsule (30 mg total) by mouth 2 (two) times daily.   famotidine-calcium carbonate-magnesium hydroxide 10-800-165 MG chewable tablet Commonly known as: PEPCID COMPLETE Chew 1 tablet by mouth daily as needed (heartburn).   folic acid 1 MG tablet Commonly known as: FOLVITE Take 1 mg by mouth daily.   GAVISCON PO Take 4 tablets by mouth daily as needed (upset stomach).   hydrocortisone-pramoxine 2.5-1 % rectal cream Commonly known as: ANALPRAM-HC Place 1 Application rectally 3 (three) times daily as needed for hemorrhoids.   hydroxychloroquine 200 MG tablet Commonly known as: PLAQUENIL Take 200 mg by mouth 2 (two) times daily.   lactulose 10 GM/15ML solution Commonly known as: CHRONULAC Take 3 mLs by mouth 2 (two) times daily.   lisinopril 40 MG tablet Commonly known as: ZESTRIL Take 40 mg by mouth daily.   methotrexate 2.5 MG tablet Commonly known as: RHEUMATREX Take 3 tablets by mouth 2 (two) times a week. Take 7.5 mg on Mondays and Tuesdays   multivitamin capsule Take 1 capsule by mouth daily.   omeprazole 40 MG capsule Commonly known as: PRILOSEC Take 40 mg by mouth daily.   ondansetron 4 MG tablet Commonly known as: ZOFRAN Take 1 tablet (4 mg total) by mouth every 6 (six) hours as needed for nausea.   ondansetron 8 MG disintegrating tablet Commonly known as: ZOFRAN-ODT Take 8 mg by mouth every 8 (eight) hours as needed for vomiting or nausea.   oxyCODONE 5 MG immediate release tablet Commonly known as: Oxy IR/ROXICODONE Take 0.5 tablets (2.5 mg total) by mouth every 4 (four) hours as needed for breakthrough pain.   traMADol 50 MG tablet Commonly known as: ULTRAM Take 1 tablet (50 mg total) by mouth every 6 (six) hours as needed for  moderate pain (pain score 4-6).   traZODone 50 MG tablet Commonly known as: DESYREL Take 1 tablet (50 mg total) by mouth at bedtime as needed for sleep.   triamcinolone 0.025 % cream Commonly known as: KENALOG Apply 1 Application topically 2 (two) times daily as needed (morphea flare).   trospium 20 MG tablet Commonly  known as: SANCTURA Take 1 tablet (20 mg total) by mouth 2 (two) times daily. What changed:  when to take this reasons to take this        Diagnostic Studies: DG Pelvis Portable  Result Date: 11/14/2023 CLINICAL DATA:  Right hip surgery EXAM: PORTABLE PELVIS 1-2 VIEWS COMPARISON:  02/11/2023 FINDINGS: Changes of right hip replacement. No hardware or bony complicating feature. Normal AP alignment. IMPRESSION: Right hip replacement.  No visible complicating feature. Electronically Signed   By: Charlett Nose M.D.   On: 11/14/2023 19:33   PERIPHERAL VASCULAR CATHETERIZATION  Result Date: 11/09/2023 See surgical note for result.   Disposition:  There are no questions and answers to display.           Follow-up Information     Evon Slack, PA-C Follow up in 2 week(s).   Specialties: Orthopedic Surgery, Emergency Medicine Contact information: [address] [place] [address] [phone]                  Signed: Patience Musca 11/15/2023, 9:16 AM

## 2023-11-15 NOTE — TOC Initial Note (Signed)
Transition of Care [facility]) - Initial/Assessment Note    Patient Details  Name: Elizabeth Martin MRN: [id number] Date of Birth: 1961-11-06  Transition of [facility]) CM/SW Contact:    Marlowe Sax, RN Phone Number: 11/15/2023, 9:51 AM  Clinical Narrative:                 The patient lives at home with her husband She has Single point cane, Environmental consultant - 2 wheeled, Environmental consultant - 4 wheeled, and Wheelchair (manual) and a 3 in1 at home  She is set up with [facility] for [facility] services prior to surgery by surgeons office    Expected Discharge Plan: Home w Home Health Services Barriers to Discharge: No Barriers Identified   Patient Goals and CMS Choice            Expected Discharge Plan and Services   Discharge Planning Services: CM Consult   Living arrangements for the past 2 months: Single Family Home Expected Discharge Date: 11/15/23                         HH Arranged: PT, OT HH Agency: [facility] Date HH Agency Contacted: 11/15/23 Time HH Agency Contacted: [phone] Representative spoke with at [facility] Agency: Adele Dan  Prior Living Arrangements/Services Living arrangements for the past 2 months: Single Family Home Lives with:: Spouse Patient language and need for interpreter reviewed:: Yes Do you feel safe going back to the place where you live?: Yes      Need for Family Participation in Patient Care: Yes (Comment) Care giver support system in place?: Yes (comment) Current home services: DME (Single point cane, Walker - 2 wheeled, Environmental consultant - 4 wheeled, and Wheelchair (manual)) Criminal Activity/Legal Involvement Pertinent to Current Situation/Hospitalization: No - Comment as needed  Activities of Daily Living   ADL Screening (condition at time of admission) Independently performs ADLs?: Yes (appropriate for developmental age) Is the patient deaf or have difficulty hearing?: No Does the patient have difficulty seeing, even when wearing glasses/contacts?: No Does  the patient have difficulty concentrating, remembering, or making decisions?: No  Permission Sought/Granted   Permission granted to share information with : Yes, Verbal Permission Granted              Emotional Assessment Appearance:: Appears stated age Attitude/Demeanor/Rapport: Engaged Affect (typically observed): Accepting Orientation: : Oriented to Self, Oriented to Place, Oriented to  Time, Oriented to Situation Alcohol / Substance Use: Not Applicable Psych Involvement: No (comment)  Admission diagnosis:  S/P total right hip arthroplasty [D63.875] Patient Active Problem List   Diagnosis Date Noted   S/P total right hip arthroplasty 11/14/2023   Blood coagulation disorder (HCC) 11/10/2023   PTSD (post-traumatic stress disorder) 11/10/2023   Severe episode of recurrent major depressive disorder, without psychotic features (HCC) 11/10/2023   GAD (generalized anxiety disorder) 11/10/2023   High risk medication use 11/10/2023   Anxiety and depression 03/22/2023   Cervical radiculopathy 03/16/2023   Colon cancer screening 03/15/2023   Chronic idiopathic constipation 03/15/2023   Dyspnea 03/07/2023   Rocky Mountain spotted fever 03/07/2023   Strain of gastrocnemius tendon 03/07/2023   Lower abdominal pain 03/07/2023   Pain in pelvis 03/07/2023   Small bowel obstruction (HCC) 02/11/2023   Raynaud disease 02/11/2023   Polyarthralgia 02/11/2023   Edema 01/03/2023   Diverticular disease 12/10/2022   Hot flashes 12/10/2022   Pain of right lower extremity 12/10/2022   Paresthesia of lower extremity 12/10/2022   Primary  insomnia 12/10/2022   Thyroid nodule 12/10/2022   Multiple joint pain 12/10/2022   Basal cell carcinoma of skin 12/10/2022   Acute bilateral low back pain without sciatica 11/10/2022   Chronic lower back pain 09/05/2022   S/P closure of ileostomy 08/24/2022   Ileostomy in place [facility])    Morphea 07/13/2022   Colonic stricture (HCC) 05/19/2022   Large bowel  obstruction (HCC) 05/19/2022   Hypokalemia 05/18/2022   Abdominal pain 05/18/2022   Osteoarthritis of left knee 02/24/2022   Mixed connective tissue disease (HCC) 02/17/2022   Adhesive capsulitis of left shoulder 01/27/2022   Glenohumeral arthritis, left 01/27/2022   Microscopic hematuria 01/12/2022   Otorrhagia of right ear 12/10/2021   Elevated testosterone level in female 11/04/2021   Anti-TPO antibodies present 10/20/2021   Rash 10/13/2021   Hashimoto thyroiditis, 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   Hashimoto's disease 03/23/2021   Exposure to severe acute respiratory syndrome coronavirus 2 (SARS-CoV-2) 12/19/2020   Migraine without aura and without status migrainosus, not intractable 08/19/2020   Myofascial pain 07/30/2020   Neck pain 07/30/2020   Upper back pain 07/30/2020   Cervical facet joint syndrome 07/30/2020   Hair loss 07/14/2020   Edema of lower extremity 07/11/2020   Adenomatous colon polyp 02/26/2020   Gastroesophageal reflux disease 02/15/2020   Diarrhea 02/15/2020   Sleep disorder 01/16/2020   Hyperlipidemia 07/11/2019   Right shoulder pain 04/04/2019   Pre-operative clearance 12/13/2018   Endometrial cancer (HCC) 04/05/2018   Patellofemoral pain syndrome of right knee 03/20/2018   Gastroenteritis 12/07/2017   Fatigue 11/16/2017   Morbid obesity (HCC) 08/11/2017   Stucco keratoses 08/11/2017   Essential hypertension 08/09/2017   History of endometrial cancer 08/09/2017   Lymphedema 08/09/2017   Menopause 08/09/2017   Malignant neoplastic disease (HCC) 04/07/2017   Cellulitis 09/18/2016   Benign hypertension 06/02/2016   Asthma 06/04/1969   PCP:  Pennie Banter, MD Pharmacy:   [facility] [phone] - [place], [place] - [address] [place] [address] Phone: [phone] Fax: [phone]     Social Determinants of Health (SDOH) Social History: SDOH Screenings   Food Insecurity: No Food Insecurity (11/14/2023)  Housing: Low Risk  (11/14/2023)   Transportation Needs: No Transportation Needs (11/14/2023)  Utilities: Not At Risk (11/14/2023)  Depression (PHQ2-9): High Risk (11/10/2023)  Financial Resource Strain: Low Risk  (08/20/2023)   Received from [facility]  Social Connections: Unknown (02/01/2023)   Received from [facility]  Tobacco Use: Low Risk  (11/14/2023)  Health Literacy: Low Risk  (08/20/2023)   [facility] from [facility]   SDOH Interventions:     Readmission Risk Interventions     No data to display

## 2023-11-15 NOTE — Progress Notes (Signed)
   Subjective: 1 Day Post-Op Procedure(s) (LRB): TOTAL HIP ARTHROPLASTY - posterior (Right) Patient reports pain as mild.   Patient is well, and has had no acute complaints or problems Denies any CP, SOB, ABD pain. We will continue therapy today.  Plan is to go Home after hospital stay.  Objective: Vital signs in last 24 hours: Temp:  [97.9 F (36.6 C)-98.7 F (37.1 C)] 98.2 F (36.8 C) (12/10 0804) Pulse Rate:  [72-88] 73 (12/10 0804) Resp:  [12-94] 15 (12/10 0804) BP: (111-147)/(60-87) 124/67 (12/10 0804) SpO2:  [93 %-100 %] 96 % (12/10 0804) Weight:  [81.2 kg] 81.2 kg (12/09 1843)  Intake/Output from previous day: 12/09 0701 - 12/10 0700 In: 1900 [I.V.:1300; IV Piggyback:600] Out: -  Intake/Output this shift: No intake/output data recorded.  Recent Labs    11/15/23 0540  HGB 10.5*   Recent Labs    11/15/23 0540  WBC 14.0*  RBC 3.52*  HCT 31.6*  PLT 232   Recent Labs    11/15/23 0540  NA 139  K 4.2  CL 105  CO2 24  BUN 10  CREATININE 0.76  GLUCOSE 140*  CALCIUM 8.2*   No results for input(s): "LABPT", "INR" in the last 72 hours.  EXAM General - Patient is Alert, Appropriate, and Oriented Extremity - Neurovascular intact Sensation intact distally Intact pulses distally Dorsiflexion/Plantar flexion intact Dressing - dressing C/D/I and no drainage Motor Function - intact, moving foot and toes well on exam.   Past Medical History:  Diagnosis Date   Anxiety    Asthma    Autoimmune disease (HCC)    Cancer (HCC)    Endometrial lymph node removal (30)   Corneal abrasion, right 05/24/2022   Depression    GERD (gastroesophageal reflux disease)    H/O blood clots    upper rt groin and behind both knees   History of hiatal hernia    Hypertension    Lymphedema    right leg   Morphea scleroderma    PONV (postoperative nausea and vomiting)    states gets violently ill    Assessment/Plan:   1 Day Post-Op Procedure(s) (LRB): TOTAL HIP  ARTHROPLASTY - posterior (Right) Principal Problem:   S/P total right hip arthroplasty  Estimated body mass index is 28.89 kg/m as calculated from the following:   Height as of this encounter: 5\' 6"  (1.676 m).   Weight as of this encounter: 81.2 kg. Advance diet Up with therapy, posterior hip precautions Pain controlled VSS Labs stable CM to assist with discharge to home with HHPT today pending safe completion of PT goals  DVT Prophylaxis - TED hose and SCDs Eliquis Weight-Bearing as tolerated to right leg   T. Cranston Neighbor, PA-C [facility] 11/15/2023, 8:22 AM

## 2023-11-15 NOTE — Progress Notes (Signed)
DISCHARGE NOTE:  Pt given discharge instructions and verbalized understanding. TED hose on both legs. Pt wheeled to car by staff, family providing transportation home.

## 2023-11-15 NOTE — Anesthesia Postprocedure Evaluation (Signed)
Anesthesia Post Note  Patient: Elizabeth Martin  Procedure(s) Performed: TOTAL HIP ARTHROPLASTY - posterior (Right: Hip)  Patient location during evaluation: Nursing Unit Anesthesia Type: Spinal Level of consciousness: awake and alert and oriented Pain management: pain level controlled Vital Signs Assessment: post-procedure vital signs reviewed and stable Respiratory status: spontaneous breathing and respiratory function stable Cardiovascular status: blood pressure returned to baseline Postop Assessment: no headache, no backache, no apparent nausea or vomiting, adequate PO intake and able to ambulate Anesthetic complications: no   There were no known notable events for this encounter.   Last Vitals:  Vitals:   11/14/23 2327 11/15/23 0624  BP: (!) 142/74 111/60  Pulse: 77 72  Resp: 16 16  Temp: 37.1 C 36.7 C  SpO2: 94% 99%    Last Pain:  Vitals:   11/15/23 0624  TempSrc: Oral  PainSc: 0-No pain                 Jurgen Groeneveld D Brittan Mapel

## 2023-11-15 NOTE — Plan of Care (Signed)
  Problem: Activity: Goal: Risk for activity intolerance will decrease Outcome: Progressing   Problem: Nutrition: Goal: Adequate nutrition will be maintained Outcome: Progressing   Problem: Elimination: Goal: Will not experience complications related to urinary retention Outcome: Progressing   Problem: Pain Management: Goal: General experience of comfort will improve Outcome: Progressing

## 2023-11-17 ENCOUNTER — Ambulatory Visit: Admitting: Occupational Therapy

## 2023-11-21 ENCOUNTER — Encounter: Payer: BC Managed Care – PPO | Admitting: Occupational Therapy

## 2023-11-21 NOTE — Telephone Encounter (Signed)
error 

## 2023-11-23 ENCOUNTER — Encounter: Payer: BC Managed Care – PPO | Admitting: Occupational Therapy

## 2023-11-25 ENCOUNTER — Ambulatory Visit (INDEPENDENT_AMBULATORY_CARE_PROVIDER_SITE_OTHER): Payer: BC Managed Care – PPO | Admitting: Licensed Clinical Social Worker

## 2023-11-25 DIAGNOSIS — F332 Major depressive disorder, recurrent severe without psychotic features: Secondary | ICD-10-CM | POA: Diagnosis not present

## 2023-11-25 DIAGNOSIS — F411 Generalized anxiety disorder: Secondary | ICD-10-CM

## 2023-11-25 DIAGNOSIS — F4310 Post-traumatic stress disorder, unspecified: Secondary | ICD-10-CM
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
## 2023-12-02 ENCOUNTER — Ambulatory Visit (INDEPENDENT_AMBULATORY_CARE_PROVIDER_SITE_OTHER): Payer: BC Managed Care – PPO | Admitting: Licensed Clinical Social Worker

## 2023-12-02 DIAGNOSIS — F411 Generalized anxiety disorder: Secondary | ICD-10-CM | POA: Diagnosis not present

## 2023-12-02 DIAGNOSIS — F332 Major depressive disorder, recurrent severe without psychotic features: Secondary | ICD-10-CM

## 2023-12-02 DIAGNOSIS — F4310 Post-traumatic stress disorder, unspecified: Secondary | ICD-10-CM

## 2023-12-02 NOTE — Progress Notes (Signed)
THERAPIST PROGRESS NOTE  Virtual Visit via Video Note  I connected with Elizabeth Martin on 12/02/23 at 10:00 AM EST by a video enabled telemedicine application and verified that I am speaking with the correct person using two identifiers.  Location: Patient: Address on File  Provider: [facility]   I discussed the limitations of evaluation and management by telemedicine and the availability of in person appointments. The patient expressed understanding and agreed to proceed.   I discussed the assessment and treatment plan with the patient. The patient was provided an opportunity to ask questions and all were answered. The patient agreed with the plan and demonstrated an understanding of the instructions.   The patient was advised to call back or seek an in-person evaluation if the symptoms worsen or if the condition fails to improve as anticipated.  I provided 55 minutes of non-face-to-face time during this encounter.   Dereck Leep, LCSW   Session Time: 10-10:55am  Participation Level: Active  Behavioral Response: CasualAlertEuthymic  Type of Therapy: Individual Therapy  Treatment Goals addressed:      Goal: LTG: Elimination of maladaptive behaviors and thinking patterns which interfere with resolution of trauma as evidenced by self report                      Goal: LTG: Develop and implement effective coping skills to carry out normal responsibilities and participate constructively in relationships as evidenced by self report                        Goal: LTG: Recall traumatic events without becoming overwhelmed with negative emotions       ProgressTowards Goals: Progressing  Interventions: Supportive and Other: EMDR  Summary: Elizabeth Martin is a 62 y.o. female who presents with symptoms of depression and trauma.  Patient identifies symptoms to include reexperiencing, negative mood, negative self affect, uncontrollable worry, tearfulness, avoidance,  hypervigilance. Pt was oriented times 5. Pt was cooperative and engaged. Pt denies SI/HI/AVH.     Patient utilized therapeutic space to process recent holiday.  Patient reports emotional difficulties due to inability to connect with family over the holidays in person due to recent hip surgery.  Reflected on support from her husband and expressed anxiety about physical capabilities with her husband returning to work later this week.  Patient identified she has been experiencing more pain due to decreasing her pain medications.  Patient identifies she has been coping with pain by utilizing deep breathing techniques.  Clinician and patient explored EMDR treatment and utilized [employer]  as a resource to better understand components of EMDR.  Clinician began EMDR treatment by educating patient on stabilization techniques.  Patient learned and practiced eye roll breathing, 7/11 breathing, acupressure breathing, and belly breathing. Pt expressed excitement as she experienced positive consequences as she practiced distracting herself from distressing memories.   Homework: Patient will practice eye roll breathing every day, once a day, until her next session.  Suicidal/Homicidal: Nowithout intent/plan  Therapist Response: Clinician utilized active and supportive reflection to create a safe environment for patient to process recent life stressors.  Clinician assessed for current symptoms, stressors, safety since last session.  Clinician continued to discuss EMDR treatment and educate patient on components of EMDR.  Clinician began EMDR by educating patient on coping/stabilization techniques.  Plan: Return again in 1 week.  Diagnosis: PTSD (post-traumatic stress disorder)  Severe episode of recurrent major depressive disorder, without psychotic features (HCC)  GAD (  generalized anxiety disorder)   Collaboration of Care: AEB psychiatrist can access notes and cln. Will review psychiatrists' notes. Check in with  the patient and will see LCSW per availability. Patient agreed with treatment recommendations.   Patient/Guardian was advised Release of Information must be obtained prior to any record release in order to collaborate their care with an outside provider. Patient/Guardian was advised if they have not already done so to contact the registration department to sign all necessary forms in order for [place] to release information regarding their care.   Consent: Patient/Guardian gives verbal consent for treatment and assignment of benefits for services provided during this visit. Patient/Guardian expressed understanding and agreed to proceed.   Dereck Leep, LCSW 12/02/2023

## 2023-12-06 ENCOUNTER — Ambulatory Visit: Payer: BC Managed Care – PPO | Admitting: Licensed Clinical Social Worker

## 2023-12-06 DIAGNOSIS — F4310 Post-traumatic stress disorder, unspecified: Secondary | ICD-10-CM

## 2023-12-06 DIAGNOSIS — F332 Major depressive disorder, recurrent severe without psychotic features: Secondary | ICD-10-CM | POA: Diagnosis not present

## 2023-12-06 DIAGNOSIS — F411 Generalized anxiety disorder: Secondary | ICD-10-CM | POA: Diagnosis not present

## 2023-12-06 NOTE — Progress Notes (Signed)
 THERAPIST PROGRESS NOTE  Session Time: 8:01am-8:50am  Participation Level: Active  Behavioral Response: CasualAlertDepressed  Type of Therapy: Individual Therapy  Treatment Goals addressed:  Goal: LTG: Elimination of maladaptive behaviors and thinking patterns which interfere with resolution of trauma as evidenced by self report                               Goal: LTG: Develop and implement effective coping skills to carry out normal responsibilities and participate constructively in relationships as evidenced by self report                               Goal: LTG: Recall traumatic events without becoming overwhelmed with negative emotions       ProgressTowards Goals: Progressing  Interventions: Supportive and Other: EMDR  Summary: Elizabeth Martin is a 62 y.o. female who presents with symptoms of depression and trauma.  Patient identifies symptoms to include reexperiencing, negative mood, negative self affect, uncontrollable worry, tearfulness, avoidance, hypervigilance. Pt was oriented times 5. Pt was cooperative and engaged. Pt denies SI/HI/AVH.      Patient shared she recently discovered new triggers related to rain and men's voices.  Patient reflected on experience where she had difficulties regulating.  Patient discussed upcoming psychiatry appointment and expressed a desire to change her medications.  Patient reports she is noticing a patterns of the evenings being more difficult as she often reexperiences unwanted memories as she is trying to fall asleep.  Worked with clinician to process sleep hygiene and discussed ways patient can cope before falling asleep such as engaging in thought dumping, tapping exercises, listening to calming music.  Clinician assessed effectiveness of previous stabilization techniques.  Patient reflected on use of finger breathing and bilateral tapping.  Patient identified finger breathing was not as successful as bilateral tapping.  Patient  continues to express hope with EMDR.  Clinician educated patient on the construction of a container.  Patient labeled her container as the safe.  Worked with clinician to practice placing unwanted memories in her safe and tapped and positive sensation/experiences.  Patient identified she felt at peace and felt more powerful utilizing her container.  Patient was given the homework to practice viral breathing every day as well as utilizing her container until her next session.  Suicidal/Homicidal: Nowithout intent/plan  Therapist Response: Clinician utilized active and supportive reflection to create a safe environment for patient to process recent life stressors.  Clinician assessed for current symptoms, stressors, safety since last session.  Clinician continued EMDR by educating patient on coping/stabilization techniques.   Plan: Return again in 2 weeks.  Diagnosis: PTSD (post-traumatic stress disorder)  Severe episode of recurrent major depressive disorder, without psychotic features (HCC)  GAD (generalized anxiety disorder)  Collaboration of Care: [facility] psychiatrist can access notes and cln. Will review psychiatrists' notes. Check in with the patient and will see LCSW per availability. Patient agreed with treatment recommendations.   Patient/Guardian was advised Release of Information must be obtained prior to any record release in order to collaborate their care with an outside provider. Patient/Guardian was advised if they have not already done so to contact the registration department to sign all necessary forms in order for us  to release information regarding their care.   Consent: Patient/Guardian gives verbal consent for treatment and assignment of benefits for services provided during this visit. Patient/Guardian expressed understanding and agreed  to proceed.   Elizabeth Martin Husband, LCSW 12/06/2023

## 2023-12-12 ENCOUNTER — Ambulatory Visit: Admitting: Occupational Therapy

## 2023-12-14 ENCOUNTER — Ambulatory Visit: Admitting: Occupational Therapy

## 2023-12-15 ENCOUNTER — Ambulatory Visit (INDEPENDENT_AMBULATORY_CARE_PROVIDER_SITE_OTHER): Admitting: Licensed Clinical Social Worker

## 2023-12-15 DIAGNOSIS — F332 Major depressive disorder, recurrent severe without psychotic features: Secondary | ICD-10-CM | POA: Diagnosis not present

## 2023-12-15 DIAGNOSIS — F411 Generalized anxiety disorder: Secondary | ICD-10-CM

## 2023-12-15 DIAGNOSIS — F4310 Post-traumatic stress disorder, unspecified: Secondary | ICD-10-CM

## 2023-12-15 NOTE — Progress Notes (Signed)
 THERAPIST PROGRESS NOTE  Virtual Visit via Video Note  I connected with Elizabeth Martin on 12/15/23 at 11:00 AM EST by a video enabled telemedicine application and verified that I am speaking with the correct person using two identifiers.  Location: Patient: Address on file  Provider: [facility]   I discussed the limitations of evaluation and management by telemedicine and the availability of in person appointments. The patient expressed understanding and agreed to proceed.   I discussed the assessment and treatment plan with the patient. The patient was provided an opportunity to ask questions and all were answered. The patient agreed with the plan and demonstrated an understanding of the instructions.   The patient was advised to call back or seek an in-person evaluation if the symptoms worsen or if the condition fails to improve as anticipated.  I provided 50 minutes of non-face-to-face time during this encounter.   [name], LCSW   Session Time: 11-11:50am  Participation Level: Active  Behavioral Response: CasualAlertEuthymic  Type of Therapy: Individual Therapy  Treatment Goals addressed:      Goal: LTG: Elimination of maladaptive behaviors and thinking patterns which interfere with resolution of trauma as evidenced by self report                                Goal: LTG: Develop and implement effective coping skills to carry out normal responsibilities and participate constructively in relationships as evidenced by self report                               Goal: LTG: Recall traumatic events without becoming overwhelmed with negative emotions       ProgressTowards Goals: Progressing  Interventions: Strength-based, Supportive, and Other: EMDR  Summary: Elizabeth Martin is a 63 y.o. female who presents with symptoms of depression and trauma.  Patient identifies symptoms to include reexperiencing, negative mood, negative self affect, uncontrollable worry,  tearfulness, avoidance, hypervigilance. Pt was oriented times 5. Pt was cooperative and engaged. Pt denies SI/HI/AVH.     Patient identified improvements in physical health sharing excitement about walking unassisted and processing improvement since recent flareup.  Patient also expressed excitement to obtain further independence by working towards practicing driving herself to her doctors appointments. Discussed goals for reestablishing routine and getting back into the [facility].  Patient reports her energy is up, nausea has decreased, and she is overall feeling stronger.  Clinician reviewed use of coping and stabilization techniques since last session.  Patient reports she has been using her container consistently as well as 7/11 breathing.  Patient reports improvement in her ability to contain intrusive memories related to her trauma history.  She continues to identify evenings to be the worst times of reexperiencing.  Clinician educated patient on holiday representative of a peaceful place.  Patient labeled her peaceful place the Zen spot .  Worked with clinician to practice engaging all 5 senses and reinforced use of her safe space.   Discussed barriers to communication within a personal relationship following a triggering response by the patient.  Worked with clinician to problem solve ways she can utilize assertive communication as well as establishing boundaries for communication moving forward.  Clinician suggested the use of a code word to address communication barriers.   Suicidal/Homicidal: Nowithout intent/plan  Therapist Response: Clinician used active and supportive reflection to create a safe environment for patient to  process recent life stressors.  Clinician assessed for current symptoms, stressors, safety since last session.  Clinician continued EMDR by educating patient on coping and stabilization techniques.     Plan: Return again in 2 weeks.  Diagnosis: PTSD (post-traumatic stress  disorder)  Severe episode of recurrent major depressive disorder, without psychotic features (HCC)  GAD (generalized anxiety disorder)   Collaboration of Care: "[facility] psychiatrist can access notes and cln. Will review psychiatrists' notes. Check in with the patient and will see LCSW per availability. Patient agreed with treatment recommendations.   Patient/Guardian was advised Release of Information must be obtained prior to any record release in order to collaborate their care with an outside provider. Patient/Guardian was advised if they have not already done so to contact the registration department to sign all necessary forms in order for us  to release information regarding their care.   Consent: Patient/Guardian gives verbal consent for treatment and assignment of benefits for services provided during this visit. Patient/Guardian expressed understanding and agreed to proceed.   [name], LCSW 12/15/2023

## 2023-12-19 ENCOUNTER — Encounter: Payer: Self-pay | Admitting: Occupational Therapy

## 2023-12-19 ENCOUNTER — Other Ambulatory Visit (INDEPENDENT_AMBULATORY_CARE_PROVIDER_SITE_OTHER): Payer: Self-pay | Admitting: Vascular Surgery

## 2023-12-19 ENCOUNTER — Ambulatory Visit: Attending: Family Medicine | Admitting: Occupational Therapy

## 2023-12-19 ENCOUNTER — Ambulatory Visit: Payer: BC Managed Care – PPO | Admitting: Licensed Clinical Social Worker

## 2023-12-19 DIAGNOSIS — I890 Lymphedema, not elsewhere classified: Secondary | ICD-10-CM | POA: Diagnosis present

## 2023-12-19 DIAGNOSIS — M25651 Stiffness of right hip, not elsewhere classified: Secondary | ICD-10-CM | POA: Insufficient documentation

## 2023-12-19 DIAGNOSIS — R262 Difficulty in walking, not elsewhere classified: Secondary | ICD-10-CM | POA: Insufficient documentation

## 2023-12-19 DIAGNOSIS — Z95828 Presence of other vascular implants and grafts: Secondary | ICD-10-CM

## 2023-12-19 DIAGNOSIS — M25551 Pain in right hip: Secondary | ICD-10-CM | POA: Diagnosis present

## 2023-12-19 DIAGNOSIS — Z86718 Personal history of other venous thrombosis and embolism: Secondary | ICD-10-CM

## 2023-12-19 NOTE — Therapy (Signed)
 OUTPATIENT OCCUPATIONAL THERAPY TREATMENT NOTE  LOWER EXTREMITY LYMPHEDEMA  Patient Name: Elizabeth Martin MRN: [id number] DOB:08/19/1961, 63 y.o., female Today's Date: 12/19/2023  END OF SESSION:  Lymphedema Episode 2   OT End of Session - 12/19/23 1316     Visit Number 6    Number of Visits 36    Date for OT Re-Evaluation 01/09/24    OT Start Time 0105    OT Stop Time 0230    OT Time Calculation (min) 85 min    Activity Tolerance Patient tolerated treatment well;No increased pain    Behavior During Therapy WFL for tasks assessed/performed             Past Medical History:  Diagnosis Date   Anxiety    Asthma    Autoimmune disease (HCC)    Cancer (HCC)    Endometrial lymph node removal (30)   Corneal abrasion, right 05/24/2022   Depression    GERD (gastroesophageal reflux disease)    H/O blood clots    upper rt groin and behind both knees   History of hiatal hernia    Hypertension    Lymphedema    right leg   Morphea scleroderma    PONV (postoperative nausea and vomiting)    states gets violently ill   Past Surgical History:  Procedure Laterality Date   BASAL CELL CARCINOMA EXCISION Left    CHOLECYSTECTOMY  2008   FLEXIBLE SIGMOIDOSCOPY N/A 05/19/2022   Procedure: FLEXIBLE SIGMOIDOSCOPY;  Surgeon: Toledo, Ladell POUR, MD;  Location: [facility];  Service: Gastroenterology;  Laterality: N/A;   HERNIA REPAIR  2004   ILEOSTOMY CLOSURE N/A 08/24/2022   Procedure: ILEOSTOMY TAKEDOWN, open loop with Arthea Platt, PA-C to assist;  Surgeon: Desiderio Schanz, MD;  Location: [facility];  Service: General;  Laterality: N/A;   IVC FILTER INSERTION N/A 11/09/2023   Procedure: IVC FILTER INSERTION;  Surgeon: Marea Selinda RAMAN, MD;  Location: [facility];  Service: Cardiovascular;  Laterality: N/A;   PLANTAR FASCIECTOMY     ROBOTIC ASSISTED TOTAL HYSTERECTOMY WITH BILATERAL SALPINGO OOPHERECTOMY  02/14/2012   ROTATOR CUFF REPAIR Left 03/24/2022   TONSILLECTOMY     TOTAL  HIP ARTHROPLASTY Right 11/14/2023   Procedure: TOTAL HIP ARTHROPLASTY - posterior;  Surgeon: Lorelle Arthea, MD;  Location: [facility];  Service: Orthopedics;  Laterality: Right;   XI ROBOTIC ASSISTED LOWER ANTERIOR RESECTION N/A 05/24/2022   Procedure: XI ROBOTIC ASSISTED LOWER ANTERIOR RESECTION;  Surgeon: Desiderio Schanz, MD;  Location: [facility];  Service: General;  Laterality: N/A;   Patient Active Problem List   Diagnosis Date Noted   S/P total right hip arthroplasty 11/14/2023   Blood coagulation disorder (HCC) 11/10/2023   PTSD (post-traumatic stress disorder) 11/10/2023   Severe episode of recurrent major depressive disorder, without psychotic features (HCC) 11/10/2023   GAD (generalized anxiety disorder) 11/10/2023   High risk medication use 11/10/2023   Anxiety and depression 03/22/2023   Cervical radiculopathy 03/16/2023   Colon cancer screening 03/15/2023   Chronic idiopathic constipation 03/15/2023   Dyspnea 03/07/2023   [place] spotted fever 03/07/2023   Strain of gastrocnemius tendon 03/07/2023   Lower abdominal pain 03/07/2023   Pain in pelvis 03/07/2023   Small bowel obstruction (HCC) 02/11/2023   Raynaud disease 02/11/2023   Polyarthralgia 02/11/2023   Edema 01/03/2023   Diverticular disease 12/10/2022   Hot flashes 12/10/2022   Pain of right lower extremity 12/10/2022   Paresthesia of lower extremity 12/10/2022   Primary insomnia 12/10/2022  Thyroid  nodule 12/10/2022   Multiple joint pain 12/10/2022   Basal cell carcinoma of skin 12/10/2022   Acute bilateral low back pain without sciatica 11/10/2022   Chronic lower back pain 09/05/2022   S/P closure of ileostomy 08/24/2022   Ileostomy in place [facility])    Morphea 07/13/2022   Colonic stricture (HCC) 05/19/2022   Large bowel obstruction (HCC) 05/19/2022   Hypokalemia 05/18/2022   Abdominal pain 05/18/2022   Osteoarthritis of left knee 02/24/2022   Mixed connective tissue disease (HCC) 02/17/2022    Adhesive capsulitis of left shoulder 01/27/2022   Glenohumeral arthritis, left 01/27/2022   Microscopic hematuria 01/12/2022   Otorrhagia of right ear 12/10/2021   Elevated testosterone level in female 11/04/2021   Anti-TPO antibodies present 10/20/2021   Rash 10/13/2021   Hashimoto thyroiditis, 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   Hashimoto's disease 03/23/2021   Exposure to severe acute respiratory syndrome coronavirus 2 (SARS-CoV-2) 12/19/2020   Migraine without aura and without status migrainosus, not intractable 08/19/2020   Myofascial pain 07/30/2020   Neck pain 07/30/2020   Upper back pain 07/30/2020   Cervical facet joint syndrome 07/30/2020   Hair loss 07/14/2020   Edema of lower extremity 07/11/2020   Adenomatous colon polyp 02/26/2020   Gastroesophageal reflux disease 02/15/2020   Diarrhea 02/15/2020   Sleep disorder 01/16/2020   Hyperlipidemia 07/11/2019   Right shoulder pain 04/04/2019   Pre-operative clearance 12/13/2018   Endometrial cancer (HCC) 04/05/2018   Patellofemoral pain syndrome of right knee 03/20/2018   Gastroenteritis 12/07/2017   Fatigue 11/16/2017   Morbid obesity (HCC) 08/11/2017   Stucco keratoses 08/11/2017   Essential hypertension 08/09/2017   History of endometrial cancer 08/09/2017   Lymphedema 08/09/2017   Menopause 08/09/2017   Malignant neoplastic disease (HCC) 04/07/2017   Cellulitis 09/18/2016   Benign hypertension 06/02/2016   Asthma 06/04/1969    PCP: Glenwood Blanch, MD  REFERRING PROVIDER: Glenwood Blanch, MD  REFERRING DIAG: I89.0   THERAPY DIAG:  Lymphedema, not elsewhere classified  Rationale for Evaluation and Treatment: Rehabilitation  ONSET DATE: 2013  (Cancer-related, endometrial 2013)  SUBJECTIVE:                                                                                                                                                                                           SUBJECTIVE STATEMENT:Elizabeth Martin  presents to OT for lymphedema care to her RLE/RLQ. Pt was last seen on 10/31/23 before R total hip arthroplasty, which has healed externally. Pt continues to have home health PT. She does not rate pain, but has a considerable amount in the R hip when repositioning on the Rx bed and  when walking  to clinic from waiting room. Pt reports today is her first outing on her own and involving driving since the surgery.She reports leg swelling is much increased. Her husband continues to assist her with compression wrapping with short stretch bandages. She tells me the adjustable Velcro style leggings have not worked well for controlling swelling as she is unable to adjust them tightly enough  to affect swelling. Pt does not rate LE related pain , but describes swelling as uncomfortable, heavy and interfering with ambulation.  PERTINENT HISTORY: Asthma, Autoimmune Disease, Endometrial Ca w/ LND ( 30 bilateral pelvic and periaortic LN), adjuvant chemotherapy and XRT,   H/O blood clots, RLE/RLQ lymphedema, Robotic assisted total hysterectomy w/ bilateral oophorectomy L Rotator Cuff repair, S/P ileostomy 2/2 colon stricture 05/2022 R hip arthroplasty scheduled 11/11/23.   PAIN:  Are you having pain? RLE lymphedema, Yes: NPRS scale: R HIP 5/10 Pain location: RLE, including hip Pain description: aching, heavy, full, tight Aggravating factors: standing, walking, extended dependent sitting Relieving factors: elevation, movement  PRECAUTIONS: Fall and Other: LYMPHEDEMA  WEIGHT BEARING RESTRICTIONS: Yes 5# lifting restriction- shoulder  FALLS:  Has patient fallen in last 6 months? 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: gardening, hiking, biking, dancing- unable to participate in any of these due to  pain and swelling  HAND DOMINANCE: right   PRIOR LEVEL OF FUNCTION: Independent with basic ADLs, Independent with household mobility without device, Independent with community mobility without device, Requires assistive device for independence, Needs assistance with homemaking, Needs assistance with transfers, and Leisure: decreased  social participation for leisure pursuits due to impaired mobility and pain  PATIENT GOALS:  Be able to move freely without pain Reduce limb volume to be able to lift limb for basic daily ADLs and functional ambulation Reduce limb volume to increase ability to perform lower body dressing and bathing and grooming Reduce limb to increase body image  OBJECTIVE: Note: Objective measures were completed at Evaluation unless otherwise noted.  COGNITION:  Overall cognitive status: Within functional limits for tasks assessed   OBSERVATIONS / OTHER ASSESSMENTS:   POSTURE: WFL  LE ROM: WFL, but Limited mildly at R knee and ankle due to girth, skin approximation, and joint pain  LE MMT: WFL  Mild, Stage  II, Bilateral Lower Extremity Lymphedema 2/2 CVI and Obesity  Skin  Description Hyper-Keratosis Peau d' Orange Shiny Tight Fibrotic/ Indurated Fatty Doughy Spongy/ boggy   x x x x R>L   x   Skin dry Flaky WNL Macerated   mildly sclerotic     Color Redness Varicosities Blanching Hemosiderin Stain Mottled    x x   x   Odor Malodorous Yeast Fungal infection  WNL      x   Temperature Warm Cool wnl    x     Pitting Edema   1+ 2+ 3+ 4+ Non-pitting         x   Girth Symmetrical Asymmetrical                   Distribution    R>L RLE toes to groin    Stemmer Sign Positive Negative   +    Lymphorrhea History Of:  Present Absent     x    Wounds History Of Present Absent  Venous Arterial Pressure Sheer     x        Signs of Infection Redness Warmth Erythema Acute Swelling Drainage Borders                    Sensation Light Touch Deep  pressure Hypersensitivity   In tact Impaired In tact Impaired Absent Impaired   x  x  x     Nails WNL   Fungus nail dystrophy   x     Hair Growth Symmetrical Asymmetrical    R>L   Skin Creases Base of toes  Ankles   Base of Fingers knees       Abdominal pannus Thigh Lobules  Face/neck   x x  x        BLE COMPARATIVE LIMB VOLUMETRICS 10/10/23  LANDMARK RIGHT  10/10/23  R LEG (A-D) 5466.5 ml  R THIGH (E-G) 9186.6 ml  R FULL LIMB (A-G) 14653.1 ml  Limb Volume differential (LVD)  LVD for LEG = 44.1%, R>L LVD for THIGH= 33.98%, R>L LVD for FULL lower extremity 37.8%, R>L  Volume change since last 08/11/22 R LEG is INCREASED in volume by 59.6%. L THIGH is INCREASED by 38 %, and RLE full limb is INCREASED by 45.38%.  Volume change overall V  (Blank rows = not tested)  LANDMARK LEFT  10/12/23  L LEG (A-D) 3053.6 ml  L THIGH (E-G) 6064.8 ml  L  FULL LIMB (A-G) 9118.4 ml  Limb Volume differential (LVD)  %  Volume change since initial %  Volume change overall %  (Blank rows = not tested)   CA-related LYMPHEDEMA Hx:  SURGERY TYPE/DATE: 2013 NUMBER OF LYMPH NODES REMOVED: 30 by report- bilateral pelvic and periaortic CHEMOTHERAPY: yes RADIATION:yes INFECTIONS: no hx cellulitis GAIT: Distance walked: >500 ft Assistive device utilized: single point cane Level of assistance: Modified independence- extra time Comments: R limp  LYMPHEDEMA LIFE IMPACT SCALE (LLIS): Intake 10/12/23 75% (The extent to which lymphedema related problems impacted your life over the past week)  FOTO (functional outcome measure):10/10/23 INTAKE: 36%  PATIENT EDUCATION:  Education details: Continued Pt/ CG edu for lymphedema self care home program throughout session. Topics include outcome of comparative limb volumetrics- starting limb volume differentials (LVDs), technology and gradient techniques used for short stretch, multilayer compression wrapping, simple self-MLD, therapeutic lymphatic pumping  exercises, skin/nail care, LE precautions,. compression garment recommendations and specifications, wear and care schedule and compression garment donning / doffing w assistive devices. Discussed progress towards all OT goals since commencing CDT. All questions answered to the Pt's satisfaction. Good return. Person educated: Patient Education method: Explanation, Demonstration, and Handouts Education comprehension: verbalized understanding, returned demonstration, and needs further education  LE SELF-CARE HOME  PROGRAM: Simple self-MLD/daily to affected quadrant and body part At least 2 x daily BLE Lymphatic Pumping There ex 1 set of 10 reps each, in order, bilaterally Daily skin care to affected body part to limit infection risk and increase skin excursion Compression Bandaging Intensive stage compression: multilayer short stretch wraps with gradient techniques. One limb at a time. Length patient dependent. Self-management Phase: Appropriate daytime compression garment and hours-of-sleep device Compression garments: Custom-made gradient compression garments and hours-of-sleep devices are medically necessary because they are uniquely sized and shaped to fit the exact dimensions of the affected extremities and to provide accurate and consistent gradient compression and containment, essential  for optimally managing chronic, progressive lymphedema. The convoluted HOS devices are medically necessary to facilitate increased lymphatic circulation and limit  fibrosis formation when sleeping. Multiple custom compression garments are needed for optimal hygiene to limit infection risk. Custom compression garments should be replaced q 3-6 months When worn consistently for optimal lipo-lymphedema self-management over time.  Pt will benefit from [id number]- Circaid reduction kit whole leg to increase independence with lymphedema self care, to  reduce limb volume  for body symmetry and balance, and to limit infection  risk and progression.  ASSESSMENT:  CLINICAL IMPRESSION: RLE is moderately swollen today after hip replacement on 12/6. Surgical scar is well healed. Swelling distribution is throughout the entire le and includes the buttock and lower R abdomen, and extends to toes. Emphasis of visit on manual lymphatic drainage towards ipsilateral axillary lymph nodes, functional inguinal nodes,  and deep abdominal lymphatics. No noticeable change in limb volume visible or palpable after manual therapy. Applied full limb  multilayer, gradient compression wraps  after manual therapy from base of toes to groin. Pt able to don R shoe and pants using assistive device to complete dressing with extra time (modified independence). Cont as per POC to assist with healing, to reduce limb swelling and progression of tissue hardening due to both lymphedema and auto immune issues, and to assist with normalizing functional ambulation and mobility. Priority is reducing swelling to enable Pt to resume wearing custom compression garments ASAP to reduce bulky wraps.   10/10/23 Lymphedema Episode 2, Initial OT Evaluation : Elizabeth Martin is a 19 y a female presenting with chronic, progressive, moderate, Stage II, RLE/RLQ cancer -related lymphedema with onset 11 years ago after Rx for endometrial cancer. Pt is well known to this therapist as she has successfully undergone Complete Decongestive Therapy (CDT)  this clinic bin the past. Pt reports recent exacerbation of RLE swelling worsened as inflammation in L hip has worsened over time.  Pt has a hip replacement scheduled in late December here it [facility]. RE limb volumetrics today reveal that R LEG volume is dramatically increased by 59.65 since last visit. R LEG VOLUME is INCREASED in volume by 59.6%. L THIGH is INCREASED by 38 %, and RLE full limb is INCREASED by 45.38%. Pt presents with LE-related skin changes. Prolonged inflammation in the R hip joint has likely overloaded  already RLE/RLQ  lymphatics.  RLE/RLQ lymphedema limits Pt's ability to perform basic and instrumental ADLs, including functional ambulation, mobility and transfers, grooming , lower body bathing and dressing, skin inspection and skin care. Pt has difficulty  reaching her lower legs and feet to apply compression wraps and don/doff        compression stockings. LE limits ability to P[perform instrumental ADLs, including driving, yard work and home management activities. Lymphedema limits her ability to participate in leisure pursuits and productive activities, and it negatively impacts body image, life roles and quality of life.   Pt will benefit from an Intensive and follow along course of lymphedema. CDT will consist of Manual Lymphatic gainage   (MLD), skin care, therapeutic exercise and compression, wraps initially then garments. Pt will need assistance throughout CDT   for applying compression wraps due to limited hip AROM and decreased skin flexibility. Without skilled Occupational Therapy for lymphedema care, lymphedema will progress and further functional decline is expected.   In prep for upcoming hip replacement OT will educate Pt re assistive devices, including tub transfer bench and elevated toilet seat, in keeping with hip precautions.  OBJECTIVE IMPAIRMENTS: Abnormal gait, decreased activity tolerance, decreased balance, decreased knowledge of use of DME, decreased mobility, difficulty walking, decreased ROM,  decreased strength, increased edema, decreased skin flexibility, increased fascial restrictions, impaired sensation, pain, and chronic, progressive LLE/LLQ lymphatic swelling and associated pain.   ADL LIMITATIONS: carrying, lifting, bending, sitting, standing, squatting, sleeping, stairs, transfers, bed mobility, bathing, dressing, hygiene/grooming, and productive activities, leisure pursuits, social participation, body image  driving, shopping, housework, yard work, cooking, meal prep  PERSONAL  FACTORS: Past/current experiences and 3+ comorbidities: Morphea Scleroderma, Mixed connective tissue disease, and osteoarthritis  are also affecting patient's functional outcome.   REHAB POTENTIAL: Good  EVALUATION COMPLEXITY: Moderate  GOALS: Goals reviewed with patient? Yes  SHORT TERM GOALS: Target date: 4th OT Rx visit   Pt will demonstrate understanding of lymphedema precautions and prevention strategies with modified independence using a printed reference to identify at least 5 precautions and discussing how s/he may implement them into daily life to reduce risk of progression with extra time.  Baseline:Max A Goal status: GOAL MET  2.  Pt will be able to apply multilayer, knee length, gradient, compression wraps to one leg at a time with modified assistance (extra time and assistive device/s) to decrease limb volume, to limit infection risk, and to limit lymphedema progression.  Baseline: Dependent Goal status: GOAL MET  LONG TERM GOALS: Target date: 01/09/24 (12 weeks)  Given this patient's Intake score 36% on the functional outcomes FOTO tool, patient will experience an increase in function of 3 points to improve basic and instrumental ADLs performance, including lymphedema self-care.  Baseline: Max A Goal status: INITIAL  2.  Given this patient's Intake score of  75% on the Lymphedema Life Impact Scale (LLIS), patient will experience a reduction of at least 5 points in her perceived level of functional impairment resulting from lymphedema to improve functional performance and quality of life (QOL). Baseline: % Goal status: ONGOING  3.  Pt will achieve at least a 10% volume reduction in full, RLE limb volume to return limb to typical size and shape, to limit infection risk and LE progression, to decrease pain, to improve function. Baseline: Dependent Goal status:ONGOING  4.  Pt will obtain proper compression garments/devices and achieve modified independence (extra time +  assistive devices) with donning/doffing to optimize limb volume reductions and limit LE  progression over time. Baseline:  Goal status: ONGOING  5.  During Intensive phase CDT , with modified independence, Pt will achieve at least 85% compliance with all lymphedema self-care home program components, including daily skin care, compression wraps and /or garments, simple self MLD and lymphatic pumping therex to habituate LE self care protocol  into ADLs for optimal LE self-management over time. Baseline: Dependent Goal status: ONGOING PLAN:  OT FREQUENCY: 2x/week  OT DURATION: 12 weeks and PRN  PLANNED INTERVENTIONS: Complete Decongestive Therapy (Intensive and supported Self-Management Phases), 97110-Therapeutic exercises, 97530- Therapeutic activity, 97535- Self Care, [id number]- Manual therapy, Patient/Family education, Taping, Manual lymph drainage, Scar mobilization, Compression bandaging, DME instructions, and skin care to reduce infection risk throughout manual therapy. Fit with replacement compression garments ASAP  PLAN FOR NEXT SESSION:  Commence full limb multilayer compression wrapping using gradient bandaging techniques Pt and family edu for LE self-care home program- Bandaging   Zebedee Dec, MS, OTR/L, CLT-LANA 12/19/23 3:36 PM  12/19/2023, 3:36 PM

## 2023-12-20 ENCOUNTER — Telehealth (INDEPENDENT_AMBULATORY_CARE_PROVIDER_SITE_OTHER): Admitting: Psychiatry

## 2023-12-20 ENCOUNTER — Encounter: Payer: Self-pay | Admitting: Psychiatry

## 2023-12-20 DIAGNOSIS — F4310 Post-traumatic stress disorder, unspecified: Secondary | ICD-10-CM

## 2023-12-20 DIAGNOSIS — F411 Generalized anxiety disorder: Secondary | ICD-10-CM | POA: Diagnosis not present

## 2023-12-20 DIAGNOSIS — F332 Major depressive disorder, recurrent severe without psychotic features: Secondary | ICD-10-CM | POA: Diagnosis not present

## 2023-12-20 MED ORDER — BUPROPION HCL ER (XL) 150 MG PO TB24: 150.0000 mg | ORAL_TABLET | Freq: Every morning | ORAL | 0 refills | Status: DC

## 2023-12-20 MED ORDER — DULOXETINE HCL 30 MG PO CPEP: 30.0000 mg | ORAL_CAPSULE | Freq: Every day | ORAL | Status: DC

## 2023-12-20 NOTE — Progress Notes (Signed)
 Virtual Visit via Video Note  I connected with Elizabeth Martin on 12/20/23 at 11:00 AM EST by a video enabled telemedicine application and verified that I am speaking with the correct person using two identifiers.  Location Provider Location : [facility] Patient Location : Home  Participants: Patient , Provider    I discussed the limitations of evaluation and management by telemedicine and the availability of in person appointments. The patient expressed understanding and agreed to proceed.   I discussed the assessment and treatment plan with the patient. The patient was provided an opportunity to ask questions and all were answered. The patient agreed with the plan and demonstrated an understanding of the instructions.   The patient was advised to call back or seek an in-person evaluation if the symptoms worsen or if the condition fails to improve as anticipated.     BH MD OP Progress Note  12/21/2023 1:10 PM Laylani Pudwill  MRN:  [id number]  Chief Complaint:  Chief Complaint  Patient presents with   Follow-up   Depression   Anxiety   Post-Traumatic Stress Disorder   Medication Refill   HPI: Elizabeth Martin is a 63 year old Caucasian female, retired, lives in [place], married, has a history of MDD, GAD, PTSD, mixed connective tissue disease, ANA positive, long-term use of immunosuppressant medication, history of endometrial cancer, chronic pain, status post hip surgery was evaluated by telemedicine today.  The patient reports a pattern of racing thoughts and negative feelings that predominantly occur at night. She has been taking Cymbalta  60mg  daily and Bupropion  75mg  daily. The patient has been working with  therapist, who has been teaching her coping skills to manage these episodes. However, she has been experiencing difficulty sleeping due to these racing thoughts and occasional nightmares. The patient has been using trazodone  to aid sleep, but notes caution due to recent hip  replacement surgery.  The patient has been taking Buspar  10mg  twice a day, which she reports has taken the edge off her symptoms slightly, but not significantly. She has been on Cymbalta  since 2013 and questions its efficacy. The patient has expressed interest in increasing the dosage of Wellbutrin  and reducing the dosage of Cymbalta .  The patient also reports occasional nightmares, which she believes may be triggered by external factors. She has been using relaxation techniques and music to aid sleep, but sometimes these thoughts still creep in. The patient has expressed a desire to continue with therapy rather than adding another medication to manage these nightmares.  Patient currently denies any suicidality, homicidality or perceptual disturbances.  Visit Diagnosis:    ICD-10-CM   1. Severe episode of recurrent major depressive disorder, without psychotic features (HCC)  F33.2 DULoxetine  (CYMBALTA ) 30 MG capsule    buPROPion  (WELLBUTRIN  XL) 150 MG 24 hr tablet    2. PTSD (post-traumatic stress disorder)  F43.10     3. GAD (generalized anxiety disorder)  F41.1       Past Psychiatric History: I have reviewed past psychiatric history from my progress note on 11/10/2023.  Past Medical History:  Past Medical History:  Diagnosis Date   Anxiety    Asthma    Autoimmune disease (HCC)    Cancer (HCC)    Endometrial lymph node removal (30)   Corneal abrasion, right 05/24/2022   Depression    GERD (gastroesophageal reflux disease)    H/O blood clots    upper rt groin and behind both knees   History of hiatal hernia    Hypertension    Lymphedema  right leg   Morphea scleroderma    PONV (postoperative nausea and vomiting)    states gets violently ill    Past Surgical History:  Procedure Laterality Date   BASAL CELL CARCINOMA EXCISION Left    CHOLECYSTECTOMY  2008   FLEXIBLE SIGMOIDOSCOPY N/A 05/19/2022   Procedure: FLEXIBLE SIGMOIDOSCOPY;  Surgeon: Toledo, Ladell POUR, MD;   Location: [facility] ENDOSCOPY;  Service: Gastroenterology;  Laterality: N/A;   HERNIA REPAIR  2004   ILEOSTOMY CLOSURE N/A 08/24/2022   Procedure: ILEOSTOMY TAKEDOWN, open loop with Arthea Platt, PA-C to assist;  Surgeon: Desiderio Schanz, MD;  Location: [facility] ORS;  Service: General;  Laterality: N/A;   IVC FILTER INSERTION N/A 11/09/2023   Procedure: IVC FILTER INSERTION;  Surgeon: Marea Selinda RAMAN, MD;  Location: [facility];  Service: Cardiovascular;  Laterality: N/A;   PLANTAR FASCIECTOMY     ROBOTIC ASSISTED TOTAL HYSTERECTOMY WITH BILATERAL SALPINGO OOPHERECTOMY  02/14/2012   ROTATOR CUFF REPAIR Left 03/24/2022   TONSILLECTOMY     TOTAL HIP ARTHROPLASTY Right 11/14/2023   Procedure: TOTAL HIP ARTHROPLASTY - posterior;  Surgeon: Lorelle Arthea, MD;  Location: [facility] ORS;  Service: Orthopedics;  Laterality: Right;   XI ROBOTIC ASSISTED LOWER ANTERIOR RESECTION N/A 05/24/2022   Procedure: XI ROBOTIC ASSISTED LOWER ANTERIOR RESECTION;  Surgeon: Desiderio Schanz, MD;  Location: [facility] ORS;  Service: General;  Laterality: N/A;    Family Psychiatric History: I have reviewed family psychiatric history from progress note on 11/10/2023.  Family History:  Family History  Problem Relation Age of Onset   Mental illness Neg Hx     Social History: I have reviewed social history from progress note on 11/10/2023. Social History   Socioeconomic History   Marital status: Married    Spouse name: Not on file   Number of children: Not on file   Years of education: Not on file   Highest education level: Not on file  Occupational History   Not on file  Tobacco Use   Smoking status: Never   Smokeless tobacco: Never  Vaping Use   Vaping status: Never Used  Substance and Sexual Activity   Alcohol use: Yes    Comment: occ.   Drug use: Never   Sexual activity: Not Currently  Other Topics Concern   Not on file  Social History Narrative   ** Merged History Encounter **       Social Drivers of Health    Financial Resource Strain: Low Risk  (08/20/2023)   Received from [facility]   Overall Financial Resource Strain (CARDIA)    Difficulty of Paying Living Expenses: Not hard at all  Food Insecurity: No Food Insecurity (11/14/2023)   Hunger Vital Sign    Worried About Running Out of Food in the Last Year: Never true    Ran Out of Food in the Last Year: Never true  Transportation Needs: No Transportation Needs (11/14/2023)   PRAPARE - Administrator, Civil Service (Medical): No    Lack of Transportation (Non-Medical): No  Physical Activity: Not on file  Stress: Not on file  Social Connections: Unknown (02/01/2023)   [facility]   Social Network    Social Network: Not on file    Allergies:  Allergies  Allergen Reactions   Carboplatin Anaphylaxis        Hydromorphone  Anaphylaxis    Stopped breathing  Respiratory arrest - due to dosage administered per the patient.   Latex Hives, Itching  and Rash   Nitrofurantoin Nausea And Vomiting   Silicone Itching and Rash   Wheat Other (See Comments)    Body aches and swelling     Metabolic Disorder Labs: No results found for: HGBA1C, MPG No results found for: PROLACTIN No results found for: CHOL, TRIG, HDL, CHOLHDL, VLDL, LDLCALC No results found for: TSH  Therapeutic Level Labs: No results found for: LITHIUM No results found for: VALPROATE No results found for: CBMZ  Current Medications: Current Outpatient Medications  Medication Sig Dispense Refill   acetaminophen  (TYLENOL ) 500 MG tablet Take 2 tablets (1,000 mg total) by mouth every 8 (eight) hours. 30 tablet 0   albuterol  (VENTOLIN  HFA) 108 (90 Base) MCG/ACT inhaler Inhale 2 puffs into the lungs every 6 (six) hours as needed for shortness of breath or wheezing.     Alum Hydroxide-Mag Carbonate (GAVISCON PO) Take 4 tablets by mouth daily as needed (upset stomach).     apixaban  (ELIQUIS ) 2.5 MG TABS tablet  Take 1 tablet (2.5 mg total) by mouth every 12 (twelve) hours. 60 tablet 0   buPROPion  (WELLBUTRIN  XL) 150 MG 24 hr tablet Take 1 tablet (150 mg total) by mouth in the morning. 90 tablet 0   busPIRone  (BUSPAR ) 10 MG tablet Take 10 mg by mouth 2 (two) times daily.     celecoxib  (CELEBREX ) 200 MG capsule Take 1 capsule (200 mg total) by mouth daily.     cetirizine (ZYRTEC) 10 MG tablet Take 10 mg by mouth daily as needed for allergies.     CLOBETASOL PROPIONATE E 0.05 % emollient cream Apply topically.     desonide (DESOWEN) 0.05 % cream Apply 1 Application topically 2 (two) times daily as needed (morphea flare).     famotidine -calcium carbonate-magnesium  hydroxide (PEPCID  COMPLETE) [phone] MG chewable tablet Chew 1 tablet by mouth daily as needed (heartburn).     folic acid (FOLVITE) 1 MG tablet Take 1 mg by mouth daily.     hydrocortisone-pramoxine (ANALPRAM-HC) 2.5-1 % rectal cream Place 1 Application rectally 3 (three) times daily as needed for hemorrhoids.     hydroxychloroquine  (PLAQUENIL ) 200 MG tablet Take 200 mg by mouth 2 (two) times daily.     lisinopril  (ZESTRIL ) 40 MG tablet Take 40 mg by mouth daily.     methotrexate (RHEUMATREX) 2.5 MG tablet Take 3 tablets by mouth 2 (two) times a week. Take 7.5 mg on Mondays and Tuesdays     Multiple Vitamin (MULTIVITAMIN) capsule Take 1 capsule by mouth daily.     omeprazole (PRILOSEC) 40 MG capsule Take 40 mg by mouth daily.     ondansetron  (ZOFRAN ) 4 MG tablet Take 1 tablet (4 mg total) by mouth every 6 (six) hours as needed for nausea. 20 tablet 0   ondansetron  (ZOFRAN -ODT) 8 MG disintegrating tablet Take 8 mg by mouth every 8 (eight) hours as needed for vomiting or nausea.     traZODone  (DESYREL ) 50 MG tablet Take 1 tablet (50 mg total) by mouth at bedtime as needed for sleep. 90 tablet 0   triamcinolone (KENALOG) 0.025 % cream Apply 1 Application topically 2 (two) times daily as needed (morphea flare).     trospium  (SANCTURA ) 20 MG tablet  Take 1 tablet (20 mg total) by mouth 2 (two) times daily as needed (overactive bladder). 180 tablet 1   DULoxetine  (CYMBALTA ) 30 MG capsule Take 1 capsule (30 mg total) by mouth daily.     No current facility-administered medications for this visit.  Musculoskeletal: Strength & Muscle Tone:  UTA Gait & Station:  Seated Patient leans: N/A  Psychiatric Specialty Exam: Review of Systems  Psychiatric/Behavioral:  Positive for dysphoric mood. The patient is nervous/anxious.     There were no vitals taken for this visit.There is no height or weight on file to calculate BMI.  General Appearance: Fairly Groomed  Eye Contact:  Fair  Speech:  Clear and Coherent  Volume:  Normal  Mood:  Anxious and Depressed  Affect:  Appropriate  Thought Process:  Goal Directed and Descriptions of Associations: Intact  Orientation:  Full (Time, Place, and Person)  Thought Content: Logical   Suicidal Thoughts:  No  Homicidal Thoughts:  No  Memory:  Immediate;   Fair Recent;   Fair Remote;   Fair  Judgement:  Fair  Insight:  Fair  Psychomotor Activity:  Normal  Concentration:  Concentration: Fair and Attention Span: Fair  Recall:  [employer] of Knowledge: Fair  Language: Fair  Akathisia:  No  Handed:  Right  AIMS (if indicated): not done  Assets:  Communication Skills Desire for Improvement Housing Social Support  ADL's:  Intact  Cognition: WNL  Sleep:  Fair   Screenings: GAD-7    Advertising Copywriter from 11/25/2023 in [facility] Office Visit from 11/10/2023 in [facility]  Total GAD-7 Score 20 17      PHQ2-9    Flowsheet Row Counselor from 11/25/2023 in [facility] Office Visit from 11/10/2023 in [facility]  PHQ-2 Total Score 6 5  PHQ-9 Total Score 23 21      Flowsheet Row Video Visit from 12/20/2023 in [facility] Counselor from 11/25/2023 in [facility] Admission (Discharged) from 11/14/2023 in [facility]IOPER[facility]  C-SSRS RISK CATEGORY No Risk No Risk No Risk        Assessment and Plan: Judyann Casasola is a 63 year old Caucasian female with history of depression, anxiety, multiple medical problems including mixed connective tissue disease, history of endometrial cancer, chronic pain recent surgery was evaluated by telemedicine today.  Patient is currently improving, discussed assessment and plan as noted below.  Major Depression-improving Currently on Cymbalta  and Wellbutrin . Reports Cymbalta  may not be effective and experiences racing thoughts and anxiety at night. Wellbutrin  helps during the day but not at night. Discussed reducing Cymbalta  and increasing Wellbutrin . Informed that Wellbutrin  is more effective for depression and may not help with anxiety. Discussed potential to switch medications if Wellbutrin  does not help with anxiety. - Reduce Cymbalta  to 30 mg once daily - Increase Wellbutrin  to 150 mg extended release once daily in the morning - Continue therapy with Ms.Kristina Lanell - Follow up in six weeks  Generalized Anxiety Disorder (GAD)-unstable Reports racing thoughts and anxiety primarily at night. Current medications include Cymbalta , Wellbutrin , and Buspar . Discussed that Wellbutrin  can affect sleep if taken late. Informed that Buspar  can also affect sleep in some individuals. - Continue Buspar  10 mg twice daily - we will consider increasing the dosage of BuSpar  or changing to a new SSRI/SNRI in the future. - Monitor anxiety symptoms and adjust treatment as needed  Post-Traumatic Stress Disorder (PTSD)-improving Occasional nightmares. Working with a therapist on coping strategies and has started initial EMDR techniques. Prefers to continue with therapy over medication for nightmares. -  Continue therapy with Ms.Perkins - Continue trazodone  50 mg at bedtime as needed.  Follow-up - Follow up appointment on February 25th at 10:30 AM via video.   Collaboration of Care: Collaboration of Care: Referral or follow-up with counselor/therapist AEB patient encouraged to continue CBT.  Patient/Guardian was advised Release of Information must be obtained prior to any record release in order to collaborate their care with an outside provider. Patient/Guardian was advised if they have not already done so to contact the registration department to sign all necessary forms in order for us  to release information regarding their care.   Consent: Patient/Guardian gives verbal consent for treatment and assignment of benefits for services provided during this visit. Patient/Guardian expressed understanding and agreed to proceed.  This note was generated in part or whole with voice recognition software. Voice recognition is usually quite accurate but there are transcription errors that can and very often do occur. I apologize for any typographical errors that were not detected and corrected.     Emonii Wienke, MD 12/21/2023, 1:10 PM

## 2023-12-21 ENCOUNTER — Ambulatory Visit (INDEPENDENT_AMBULATORY_CARE_PROVIDER_SITE_OTHER): Payer: 59 | Admitting: Nurse Practitioner

## 2023-12-21 ENCOUNTER — Encounter: Payer: Self-pay | Admitting: Psychiatry

## 2023-12-21 ENCOUNTER — Ambulatory Visit: Admitting: Occupational Therapy

## 2023-12-21 ENCOUNTER — Other Ambulatory Visit
Admission: RE | Admit: 2023-12-21 | Discharge: 2023-12-21 | Disposition: A | Discharge status: Home / Self Care | Payer: 59 | Source: Ambulatory Visit | Attending: Psychiatry | Admitting: Psychiatry

## 2023-12-21 ENCOUNTER — Encounter (INDEPENDENT_AMBULATORY_CARE_PROVIDER_SITE_OTHER): Payer: Self-pay | Admitting: Nurse Practitioner

## 2023-12-21 ENCOUNTER — Ambulatory Visit (INDEPENDENT_AMBULATORY_CARE_PROVIDER_SITE_OTHER): Payer: 59

## 2023-12-21 VITALS — BP 118/79 | HR 92 | Resp 16 | Wt 167.8 lb

## 2023-12-21 DIAGNOSIS — I890 Lymphedema, not elsewhere classified: Secondary | ICD-10-CM

## 2023-12-21 DIAGNOSIS — I10 Essential (primary) hypertension: Secondary | ICD-10-CM

## 2023-12-21 DIAGNOSIS — I824Y9 Acute embolism and thrombosis of unspecified deep veins of unspecified proximal lower extremity: Secondary | ICD-10-CM | POA: Diagnosis not present

## 2023-12-21 DIAGNOSIS — Z95828 Presence of other vascular implants and grafts: Secondary | ICD-10-CM | POA: Diagnosis not present

## 2023-12-21 DIAGNOSIS — Z86718 Personal history of other venous thrombosis and embolism: Secondary | ICD-10-CM

## 2023-12-21 DIAGNOSIS — Z79899 Other long term (current) drug therapy: Secondary | ICD-10-CM | POA: Insufficient documentation

## 2023-12-21 LAB — TSH: TSH: 2.066 u[IU]/mL (ref 0.350–4.500)

## 2023-12-22 ENCOUNTER — Ambulatory Visit (INDEPENDENT_AMBULATORY_CARE_PROVIDER_SITE_OTHER): Payer: 59 | Admitting: Licensed Clinical Social Worker

## 2023-12-22 DIAGNOSIS — F4310 Post-traumatic stress disorder, unspecified: Secondary | ICD-10-CM

## 2023-12-22 DIAGNOSIS — F332 Major depressive disorder, recurrent severe without psychotic features: Secondary | ICD-10-CM | POA: Diagnosis not present

## 2023-12-22 DIAGNOSIS — F411 Generalized anxiety disorder: Secondary | ICD-10-CM
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
## 2023-12-23 NOTE — Therapy (Signed)
OUTPATIENT OCCUPATIONAL THERAPY TREATMENT NOTE  LOWER EXTREMITY LYMPHEDEMA  Patient Name: Elizabeth Martin MRN: [id number] DOB:September 28, 1961, 63 y.o., female Today's Date: 12/23/2023  END OF SESSION:  Lymphedema Episode 2     Past Medical History:  Diagnosis Date   Anxiety    Asthma    Autoimmune disease (HCC)    Cancer (HCC)    Endometrial lymph node removal (30)   Corneal abrasion, right 05/24/2022   Depression    GERD (gastroesophageal reflux disease)    H/O blood clots    upper rt groin and behind both knees   History of hiatal hernia    Hypertension    Lymphedema    right leg   Morphea scleroderma    PONV (postoperative nausea and vomiting)    states gets violently ill   Past Surgical History:  Procedure Laterality Date   BASAL CELL CARCINOMA EXCISION Left    CHOLECYSTECTOMY  2008   FLEXIBLE SIGMOIDOSCOPY N/A 05/19/2022   Procedure: FLEXIBLE SIGMOIDOSCOPY;  Surgeon: Toledo, Boykin Nearing, MD;  Location: [facility];  Service: Gastroenterology;  Laterality: N/A;   HERNIA REPAIR  2004   ILEOSTOMY CLOSURE N/A 08/24/2022   Procedure: ILEOSTOMY TAKEDOWN, open loop with Lynden Oxford, PA-C to assist;  Surgeon: Henrene Dodge, MD;  Location: [facility];  Service: General;  Laterality: N/A;   IVC FILTER INSERTION N/A 11/09/2023   Procedure: IVC FILTER INSERTION;  Surgeon: Annice Needy, MD;  Location: [facility];  Service: Cardiovascular;  Laterality: N/A;   PLANTAR FASCIECTOMY     ROBOTIC ASSISTED TOTAL HYSTERECTOMY WITH BILATERAL SALPINGO OOPHERECTOMY  02/14/2012   ROTATOR CUFF REPAIR Left 03/24/2022   TONSILLECTOMY     TOTAL HIP ARTHROPLASTY Right 11/14/2023   Procedure: TOTAL HIP ARTHROPLASTY - posterior;  Surgeon: Reinaldo Berber, MD;  Location: [facility];  Service: Orthopedics;  Laterality: Right;   XI ROBOTIC ASSISTED LOWER ANTERIOR RESECTION N/A 05/24/2022   Procedure: XI ROBOTIC ASSISTED LOWER ANTERIOR RESECTION;  Surgeon: Henrene Dodge, MD;  Location: [facility];   Service: General;  Laterality: N/A;   Patient Active Problem List   Diagnosis Date Noted   S/P total right hip arthroplasty 11/14/2023   Blood coagulation disorder (HCC) 11/10/2023   PTSD (post-traumatic stress disorder) 11/10/2023   Severe episode of recurrent major depressive disorder, without psychotic features (HCC) 11/10/2023   GAD (generalized anxiety disorder) 11/10/2023   High risk medication use 11/10/2023   Anxiety and depression 03/22/2023   Cervical radiculopathy 03/16/2023   Colon cancer screening 03/15/2023   Chronic idiopathic constipation 03/15/2023   Dyspnea 03/07/2023   [place] spotted fever 03/07/2023   Strain of gastrocnemius tendon 03/07/2023   Lower abdominal pain 03/07/2023   Pain in pelvis 03/07/2023   Small bowel obstruction (HCC) 02/11/2023   Raynaud disease 02/11/2023   Polyarthralgia 02/11/2023   Edema 01/03/2023   Diverticular disease 12/10/2022   Hot flashes 12/10/2022   Pain of right lower extremity 12/10/2022   Paresthesia of lower extremity 12/10/2022   Primary insomnia 12/10/2022   Thyroid nodule 12/10/2022   Multiple joint pain 12/10/2022   Basal cell carcinoma of skin 12/10/2022   Acute bilateral low back pain without sciatica 11/10/2022   Chronic lower back pain 09/05/2022   S/P closure of ileostomy 08/24/2022   Ileostomy in place [facility])    Morphea 07/13/2022   Colonic stricture (HCC) 05/19/2022   Large bowel obstruction (HCC) 05/19/2022   Hypokalemia 05/18/2022   Abdominal pain 05/18/2022   Osteoarthritis of left knee 02/24/2022  Mixed connective tissue disease (HCC) 02/17/2022   Adhesive capsulitis of left shoulder 01/27/2022   Glenohumeral arthritis, left 01/27/2022   Microscopic hematuria 01/12/2022   Otorrhagia of right ear 12/10/2021   Elevated testosterone level in female 11/04/2021   Anti-TPO antibodies present 10/20/2021   Rash 10/13/2021   Hashimoto thyroiditis, 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   Hashimoto's disease  03/23/2021   Exposure to severe acute respiratory syndrome coronavirus 2 (SARS-CoV-2) 12/19/2020   Migraine without aura and without status migrainosus, not intractable 08/19/2020   Myofascial pain 07/30/2020   Neck pain 07/30/2020   Upper back pain 07/30/2020   Cervical facet joint syndrome 07/30/2020   Hair loss 07/14/2020   Edema of lower extremity 07/11/2020   Adenomatous colon polyp 02/26/2020   Gastroesophageal reflux disease 02/15/2020   Diarrhea 02/15/2020   Sleep disorder 01/16/2020   Hyperlipidemia 07/11/2019   Right shoulder pain 04/04/2019   Pre-operative clearance 12/13/2018   Endometrial cancer (HCC) 04/05/2018   Patellofemoral pain syndrome of right knee 03/20/2018   Gastroenteritis 12/07/2017   Fatigue 11/16/2017   Morbid obesity (HCC) 08/11/2017   Stucco keratoses 08/11/2017   Essential hypertension 08/09/2017   History of endometrial cancer 08/09/2017   Lymphedema 08/09/2017   Menopause 08/09/2017   Malignant neoplastic disease (HCC) 04/07/2017   Cellulitis 09/18/2016   Benign hypertension 06/02/2016   Asthma 06/04/1969    PCP: Pennie Banter, MD  REFERRING PROVIDER: Pennie Banter, MD  REFERRING DIAG: I89.0   THERAPY DIAG:  Lymphedema, not elsewhere classified  Rationale for Evaluation and Treatment: Rehabilitation  ONSET DATE: 2013  (Cancer-related, endometrial 2013)  SUBJECTIVE:                                                                                                                                                                                           SUBJECTIVE STATEMENT:Elizabeth Martin presents to OT for lymphedema care to her RLE/RLQ. Pt has resumed OT after R total hip arthroplasty to limit limb swelling and lymphedema progression.  which has healed externally. She does not rate pain today, but it is obvious from body language ( wincing, guarding) that she has as a considerable R hip when repositioning on the Rx bed. Pt's spouse continues  to assist her with compression wrapping with short stretch bandages between visits. She tells me the adjustable Velcro style leggings have not worked well for controlling swelling as she is unable to adjust them tightly enough.  PERTINENT HISTORY: Asthma, Autoimmune Disease, Endometrial Ca w/ LND ( 30 bilateral pelvic and periaortic LN), adjuvant chemotherapy and XRT,   H/O blood clots, RLE/RLQ lymphedema, Robotic assisted total hysterectomy w/ bilateral oophorectomy L  Rotator Cuff repair, S/P ileostomy 2/2 colon stricture 05/2022 R hip arthroplasty scheduled 11/11/23.   PAIN:  Are you having pain? RLE lymphedema, Yes: NPRS scale: R HIP not rated /10 Pain location: RLE, including hip Pain description: aching, heavy, full, tight Aggravating factors: standing, walking, extended dependent sitting Relieving factors: elevation, movement  PRECAUTIONS: Fall and Other: LYMPHEDEMA  WEIGHT BEARING RESTRICTIONS: Yes 5# lifting restriction- shoulder  FALLS:  Has patient fallen in last 6 months? No  LIVING ENVIRONMENT: Lives with: lives with their spouse Lives in: House/apartment Stairs: No;  Has following equipment at home: Single point cane, Environmental consultant - 2 wheeled, Environmental consultant - 4 wheeled, and Wheelchair (manual)  OCCUPATION: retired Control and instrumentation engineer: gardening, hiking, biking, dancing- unable to participate in any of these due to pain and swelling  HAND DOMINANCE: right   PRIOR LEVEL OF FUNCTION: Independent with basic ADLs, Independent with household mobility without device, Independent with community mobility without device, Requires assistive device for independence, Needs assistance with homemaking, Needs assistance with transfers, and Leisure: decreased  social participation for leisure pursuits due to impaired mobility and pain  PATIENT GOALS:  Be able to move freely without pain Reduce limb volume to be able to lift limb for basic daily ADLs and functional ambulation Reduce limb volume to  increase ability to perform lower body dressing and bathing and grooming Reduce limb to increase body image  OBJECTIVE: Note: Objective measures were completed at Evaluation unless otherwise noted.  COGNITION:  Overall cognitive status: Within functional limits for tasks assessed   OBSERVATIONS / OTHER ASSESSMENTS:   POSTURE: WFL  LE ROM: WFL, but Limited mildly at R knee and ankle due to girth, skin approximation, and joint pain  LE MMT: WFL  Mild, Stage  II, Bilateral Lower Extremity Lymphedema 2/2 CVI and Obesity  Skin  Description Hyper-Keratosis Peau d' Orange Shiny Tight Fibrotic/ Indurated Fatty Doughy Spongy/ boggy   x x x x R>L   x   Skin dry Flaky WNL Macerated   mildly sclerotic     Color Redness Varicosities Blanching Hemosiderin Stain Mottled    x x   x   Odor Malodorous Yeast Fungal infection  WNL      x   Temperature Warm Cool wnl    x     Pitting Edema   1+ 2+ 3+ 4+ Non-pitting         x   Girth Symmetrical Asymmetrical                   Distribution    R>L RLE toes to groin    Stemmer Sign Positive Negative   +    Lymphorrhea History Of:  Present Absent     x    Wounds History Of Present Absent Venous Arterial Pressure Sheer     x        Signs of Infection Redness Warmth Erythema Acute Swelling Drainage Borders                    Sensation Light Touch Deep pressure Hypersensitivity   In tact Impaired In tact Impaired Absent Impaired   x  x  x     Nails WNL   Fungus nail dystrophy   x     Hair Growth Symmetrical Asymmetrical    R>L   Skin Creases Base of toes  Ankles   Base of Fingers knees       Abdominal pannus Thigh Lobules  Face/neck  x x  x        BLE COMPARATIVE LIMB VOLUMETRICS 10/10/23  LANDMARK RIGHT  10/10/23  R LEG (A-D) 5466.5 ml  R THIGH (E-G) 9186.6 ml  R FULL LIMB (A-G) 14653.1 ml  Limb Volume differential (LVD)  LVD for LEG = 44.1%, R>L LVD for THIGH= 33.98%, R>L LVD for FULL lower extremity  37.8%, R>L  Volume change since last 08/11/22 R LEG is INCREASED in volume by 59.6%. L THIGH is INCREASED by 38 %, and RLE full limb is INCREASED by 45.38%.  Volume change overall V  (Blank rows = not tested)  LANDMARK LEFT  10/12/23  L LEG (A-D) 3053.6 ml  L THIGH (E-G) 6064.8 ml  L  FULL LIMB (A-G) 9118.4 ml  Limb Volume differential (LVD)  %  Volume change since initial %  Volume change overall %  (Blank rows = not tested)   CA-related LYMPHEDEMA Hx:  SURGERY TYPE/DATE: 2013 NUMBER OF LYMPH NODES REMOVED: 30 by report- bilateral pelvic and periaortic CHEMOTHERAPY: yes RADIATION:yes INFECTIONS: no hx cellulitis GAIT: Distance walked: >500 ft Assistive device utilized: single point cane Level of assistance: Modified independence- extra time Comments: R limp  LYMPHEDEMA LIFE IMPACT SCALE (LLIS): Intake 10/12/23 75% (The extent to which lymphedema related problems impacted your life over the past week)  FOTO (functional outcome measure):10/10/23 INTAKE: 36%  PATIENT EDUCATION:  Education details: Continued Pt/ CG edu for lymphedema self care home program throughout session. Topics include outcome of comparative limb volumetrics- starting limb volume differentials (LVDs), technology and gradient techniques used for short stretch, multilayer compression wrapping, simple self-MLD, therapeutic lymphatic pumping exercises, skin/nail care, LE precautions,. compression garment recommendations and specifications, wear and care schedule and compression garment donning / doffing w assistive devices. Discussed progress towards all OT goals since commencing CDT. All questions answered to the Pt's satisfaction. Good return. Person educated: Patient Education method: Explanation, Demonstration, and Handouts Education comprehension: verbalized understanding, returned demonstration, and needs further education  LE SELF-CARE HOME  PROGRAM: Simple self-MLD/daily to affected quadrant and body part At  least 2 x daily BLE Lymphatic Pumping There ex 1 set of 10 reps each, in order, bilaterally Daily skin care to affected body part to limit infection risk and increase skin excursion Compression Bandaging Intensive stage compression: multilayer short stretch wraps with gradient techniques. One limb at a time. Length patient dependent. Self-management Phase: Appropriate daytime compression garment and hours-of-sleep device  Compression garments: Custom-made gradient compression garments and hours-of-sleep devices are medically necessary because they are uniquely sized and shaped to fit the exact dimensions of the affected extremities and to provide accurate and consistent gradient compression and containment, essential  for optimally managing chronic, progressive lymphedema. The convoluted HOS devices are medically necessary to facilitate increased lymphatic circulation and limit fibrosis formation when sleeping. Multiple custom compression garments are needed for optimal hygiene to limit infection risk. Custom compression garments should be replaced q 3-6 months When worn consistently for optimal lipo-lymphedema self-management over time.  Pt will benefit from [id number]- Circaid reduction kit whole leg to increase independence with lymphedema self care, to  reduce limb volume  for body symmetry and balance, and to limit infection risk and progression.  ASSESSMENT:  CLINICAL IMPRESSION: RLE/ RLQ remains moderately swollen and palpably dense compared with last OT session. Swelling distribution is throughout the entire le and includes the buttock and lower R abdomen, and extends to toes. Emphasis of visit on manual lymphatic drainage towards ipsilateral axillary lymph nodes, functional inguinal  nodes,  and deep abdominal lymphatics. No noticeable change in limb volume visible or palpable after manual therapy. Applied full limb  multilayer, gradient compression wraps  after manual therapy from base of toes to  groin. Pt able to don R shoe and pants using assistive device to complete dressing with extra time (modified independence). Cont as per POC to assist with healing, to reduce limb swelling and progression of tissue hardening due to both lymphedema and auto immune issues, and to assist with normalizing functional ambulation and mobility. Priority is reducing swelling to enable Pt to resume wearing custom compression garments ASAP to reduce bulky wraps.   10/10/23 Lymphedema Episode 2, Initial OT Evaluation : Marit Lorenson is a 86 y a female presenting with chronic, progressive, moderate, Stage II, RLE/RLQ cancer -related lymphedema with onset 11 years ago after Rx for endometrial cancer. Pt is well known to this therapist as she has successfully undergone Complete Decongestive Therapy (CDT)  this clinic bin the past. Pt reports recent exacerbation of RLE swelling worsened as inflammation in L hip has worsened over time.  Pt has a hip replacement scheduled in late December here it [facility]. RE limb volumetrics today reveal that R LEG volume is dramatically increased by 59.65 since last visit. R LEG VOLUME is INCREASED in volume by 59.6%. L THIGH is INCREASED by 38 %, and RLE full limb is INCREASED by 45.38%. Pt presents with LE-related skin changes. Prolonged inflammation in the R hip joint has likely overloaded  already RLE/RLQ lymphatics.  RLE/RLQ lymphedema limits Pt's ability to perform basic and instrumental ADLs, including functional ambulation, mobility and transfers, grooming , lower body bathing and dressing, skin inspection and skin care. Pt has difficulty  reaching her lower legs and feet to apply compression wraps and don/doff        compression stockings. LE limits ability to P[perform instrumental ADLs, including driving, yard work and home management activities. Lymphedema limits her ability to participate in leisure pursuits and productive activities, and it negatively impacts body image, life roles  and quality of life.   Pt will benefit from an Intensive and follow along course of lymphedema. CDT will consist of Manual Lymphatic gainage   (MLD), skin care, therapeutic exercise and compression, wraps initially then garments. Pt will need assistance throughout CDT   for applying compression wraps due to limited hip AROM and decreased skin flexibility. Without skilled Occupational Therapy for lymphedema care, lymphedema will progress and further functional decline is expected.   In prep for upcoming hip replacement OT will educate Pt re assistive devices, including tub transfer bench and elevated toilet seat, in keeping with hip precautions.  OBJECTIVE IMPAIRMENTS: Abnormal gait, decreased activity tolerance, decreased balance, decreased knowledge of use of DME, decreased mobility, difficulty walking, decreased ROM, decreased strength, increased edema, decreased skin flexibility, increased fascial restrictions, impaired sensation, pain, and chronic, progressive LLE/LLQ lymphatic swelling and associated pain.   ADL LIMITATIONS: carrying, lifting, bending, sitting, standing, squatting, sleeping, stairs, transfers, bed mobility, bathing, dressing, hygiene/grooming, and productive activities, leisure pursuits, social participation, body image  driving, shopping, housework, yard work, cooking, meal prep  PERSONAL FACTORS: Past/current experiences and 3+ comorbidities: Morphea Scleroderma, Mixed connective tissue disease, and osteoarthritis  are also affecting patient's functional outcome.   REHAB POTENTIAL: Good  EVALUATION COMPLEXITY: Moderate  GOALS: Goals reviewed with patient? Yes  SHORT TERM GOALS: Target date: 4th OT Rx visit   Pt will demonstrate understanding of lymphedema precautions and prevention strategies with modified independence using a  printed reference to identify at least 5 precautions and discussing how s/he may implement them into daily life to reduce risk of progression with  extra time.  Baseline:Max A Goal status: GOAL MET  2.  Pt will be able to apply multilayer, knee length, gradient, compression wraps to one leg at a time with modified assistance (extra time and assistive device/s) to decrease limb volume, to limit infection risk, and to limit lymphedema progression.  Baseline: Dependent Goal status: GOAL MET  LONG TERM GOALS: Target date: 01/09/24 (12 weeks)  Given this patient's Intake score 36% on the functional outcomes FOTO tool, patient will experience an increase in function of 3 points to improve basic and instrumental ADLs performance, including lymphedema self-care.  Baseline: Max A Goal status: INITIAL  2.  Given this patient's Intake score of  75% on the Lymphedema Life Impact Scale (LLIS), patient will experience a reduction of at least 5 points in her perceived level of functional impairment resulting from lymphedema to improve functional performance and quality of life (QOL). Baseline: % Goal status: ONGOING  3.  Pt will achieve at least a 10% volume reduction in full, RLE limb volume to return limb to typical size and shape, to limit infection risk and LE progression, to decrease pain, to improve function. Baseline: Dependent Goal status:ONGOING  4.  Pt will obtain proper compression garments/devices and achieve modified independence (extra time + assistive devices) with donning/doffing to optimize limb volume reductions and limit LE  progression over time. Baseline:  Goal status: ONGOING  5.  During Intensive phase CDT , with modified independence, Pt will achieve at least 85% compliance with all lymphedema self-care home program components, including daily skin care, compression wraps and /or garments, simple self MLD and lymphatic pumping therex to habituate LE self care protocol  into ADLs for optimal LE self-management over time. Baseline: Dependent Goal status: ONGOING PLAN:  OT FREQUENCY: 2x/week  OT DURATION: 12 weeks and  PRN  PLANNED INTERVENTIONS: Complete Decongestive Therapy (Intensive and supported Self-Management Phases), 97110-Therapeutic exercises, 97530- Therapeutic activity, 97535- Self Care, 16109- Manual therapy, Patient/Family education, Taping, Manual lymph drainage, Scar mobilization, Compression bandaging, DME instructions, and skin care to reduce infection risk throughout manual therapy. Fit with replacement compression garments ASAP  PLAN FOR NEXT SESSION:  Commence full limb multilayer compression wrapping using gradient bandaging techniques Pt and family edu for LE self-care home program- Bandaging   Loel Dubonnet, MS, OTR/L, CLT-LANA 12/23/23 1:02 PM  12/23/2023, 1:02 PM

## 2023-12-26 ENCOUNTER — Ambulatory Visit: Admitting: Occupational Therapy

## 2023-12-26 ENCOUNTER — Encounter (INDEPENDENT_AMBULATORY_CARE_PROVIDER_SITE_OTHER): Payer: Self-pay | Admitting: Nurse Practitioner

## 2023-12-26 DIAGNOSIS — I890 Lymphedema, not elsewhere classified: Secondary | ICD-10-CM

## 2023-12-26 NOTE — H&P (View-Only) (Signed)
Subjective:    Patient ID: Elizabeth Martin, female    DOB: 17-Feb-1961, 63 y.o.   MRN: [id number] Chief Complaint  Patient presents with   Follow-up    [facility] 6 weeks with dvt study    The patient presents to the office for follow-up status post IVC filter placement prior to joint replacement surgery.  The patient has a history of DVT and therefore was at high risk for recurrence in the perioperative timeframe after undergoing a total joint replacement.    Total hip arthroplasty surgery was on 11/14/2023.  The patient has been doing well with rehab and their mobility is increasing.  The patient notes the affected leg is not increasingly swollen or painful.  No signs and symptoms consistent with recurrence of DVT in the lower extremity.  Patient does note that as a baseline the affected extremity tends to swell with prolonged dependence, symptoms are much better with elevation.  The patient notes minimal edema in the morning which steadily worsens throughout the day.  The patient has not been using compression therapy at this point.  The patient is eliquis 2.5 mg anticoagulation.  No SOB or pleuritic chest pains.  No cough or hemoptysis.  No blood per rectum or blood in any sputum.  No excessive bruising per the patient.   No recent shortening of the patient's walking distance or new symptoms consistent with claudication.  No history of rest pain symptoms. No new ulcers or wounds of the lower extremities have occurred.  The patient denies amaurosis fugax or recent TIA symptoms. There are no recent neurological changes noted. No recent episodes of angina or shortness of breath documented.   No evidence of DVT or superficial thrombophlebitis in the right lower extremity.  There is extensive edema in the right calf with the patient does have known and significant lymphedema in the right lower extremity    Review of Systems  Cardiovascular:  Positive for leg swelling.  All other systems  reviewed and are negative.      Objective:   Physical Exam Vitals reviewed.  HENT:     Head: Normocephalic.  Cardiovascular:     Rate and Rhythm: Normal rate.     Pulses: Normal pulses.  Pulmonary:     Effort: Pulmonary effort is normal.  Musculoskeletal:     Right lower leg: Edema present.  Skin:    General: Skin is warm and dry.  Neurological:     Mental Status: She is alert and oriented to person, place, and time.  Psychiatric:        Mood and Affect: Mood normal.        Behavior: Behavior normal.        Thought Content: Thought content normal.        Judgment: Judgment normal.     BP 118/79   Pulse 92   Resp 16   Wt 167 lb 12.8 oz (76.1 kg)   BMI 27.08 kg/m   Past Medical History:  Diagnosis Date   Anxiety    Asthma    Autoimmune disease (HCC)    Cancer (HCC)    Endometrial lymph node removal (30)   Corneal abrasion, right 05/24/2022   Depression    GERD (gastroesophageal reflux disease)    H/O blood clots    upper rt groin and behind both knees   History of hiatal hernia    Hypertension    Lymphedema    right leg   Morphea scleroderma    PONV (  postoperative nausea and vomiting)    states gets violently ill    Social History   Socioeconomic History   Marital status: Married    Spouse name: Not on file   Number of children: Not on file   Years of education: Not on file   Highest education level: Not on file  Occupational History   Not on file  Tobacco Use   Smoking status: Never   Smokeless tobacco: Never  Vaping Use   Vaping status: Never Used  Substance and Sexual Activity   Alcohol use: Yes    Comment: occ.   Drug use: Never   Sexual activity: Not Currently  Other Topics Concern   Not on file  Social History Narrative   ** Merged History Encounter **       Social Drivers of Health   Financial Resource Strain: Low Risk  (08/20/2023)   Received from [facility]   Overall Financial Resource Strain (CARDIA)    Difficulty of  Paying Living Expenses: Not hard at all  Food Insecurity: No Food Insecurity (11/14/2023)   Hunger Vital Sign    Worried About Running Out of Food in the Last Year: Never true    Ran Out of Food in the Last Year: Never true  Transportation Needs: No Transportation Needs (11/14/2023)   PRAPARE - Administrator, Civil Service (Medical): No    Lack of Transportation (Non-Medical): No  Physical Activity: Not on file  Stress: Not on file  Social Connections: Unknown (02/01/2023)   Received from [facility]   Social Network    Social Network: Not on file  Intimate Partner Violence: Not At Risk (11/14/2023)   Humiliation, Afraid, Rape, and Kick questionnaire    Fear of Current or Ex-Partner: No    Emotionally Abused: No    Physically Abused: No    Sexually Abused: No    Past Surgical History:  Procedure Laterality Date   BASAL CELL CARCINOMA EXCISION Left    CHOLECYSTECTOMY  2008   FLEXIBLE SIGMOIDOSCOPY N/A 05/19/2022   Procedure: FLEXIBLE SIGMOIDOSCOPY;  Surgeon: Toledo, Boykin Nearing, MD;  Location: [facility] ENDOSCOPY;  Service: Gastroenterology;  Laterality: N/A;   HERNIA REPAIR  2004   ILEOSTOMY CLOSURE N/A 08/24/2022   Procedure: ILEOSTOMY TAKEDOWN, open loop with Lynden Oxford, PA-C to assist;  Surgeon: Henrene Dodge, MD;  Location: [facility] ORS;  Service: General;  Laterality: N/A;   IVC FILTER INSERTION N/A 11/09/2023   Procedure: IVC FILTER INSERTION;  Surgeon: Annice Needy, MD;  Location: [facility] INVASIVE CV LAB;  Service: Cardiovascular;  Laterality: N/A;   PLANTAR FASCIECTOMY     ROBOTIC ASSISTED TOTAL HYSTERECTOMY WITH BILATERAL SALPINGO OOPHERECTOMY  02/14/2012   ROTATOR CUFF REPAIR Left 03/24/2022   TONSILLECTOMY     TOTAL HIP ARTHROPLASTY Right 11/14/2023   Procedure: TOTAL HIP ARTHROPLASTY - posterior;  Surgeon: Reinaldo Berber, MD;  Location: [facility] ORS;  Service: Orthopedics;  Laterality: Right;   XI ROBOTIC ASSISTED LOWER ANTERIOR RESECTION N/A 05/24/2022    Procedure: XI ROBOTIC ASSISTED LOWER ANTERIOR RESECTION;  Surgeon: Henrene Dodge, MD;  Location: [facility] ORS;  Service: General;  Laterality: N/A;    Family History  Problem Relation Age of Onset   Mental illness Neg Hx     Allergies  Allergen Reactions   Carboplatin Anaphylaxis        Hydromorphone Anaphylaxis    Stopped breathing  Respiratory arrest - due to dosage administered per the patient.   Latex Hives,  Itching and Rash   Nitrofurantoin Nausea And Vomiting   Silicone Itching and Rash   Wheat Other (See Comments)    Body aches and swelling        Latest Ref Rng & Units 11/15/2023    5:40 AM 11/01/2023   11:10 AM 02/12/2023    6:24 AM  CBC  WBC 4.0 - 10.5 K/uL 14.0  8.7  8.9   Hemoglobin 12.0 - 15.0 g/dL 09.8  11.9  14.7   Hematocrit 36.0 - 46.0 % 31.6  37.4  36.3   Platelets 150 - 400 K/uL 232  366  279       CMP     Component Value Date/Time   NA 139 11/15/2023 0540   K 4.2 11/15/2023 0540   CL 105 11/15/2023 0540   CO2 24 11/15/2023 0540   GLUCOSE 140 (H) 11/15/2023 0540   BUN 10 11/15/2023 0540   CREATININE 0.76 11/15/2023 0540   CALCIUM 8.2 (L) 11/15/2023 0540   PROT 6.1 (L) 11/01/2023 1110   ALBUMIN 3.8 11/01/2023 1110   AST 18 11/01/2023 1110   ALT 19 11/01/2023 1110   ALKPHOS 57 11/01/2023 1110   BILITOT 0.6 11/01/2023 1110   GFRNONAA >60 11/15/2023 0540     No results found.     Assessment & Plan:   1. Deep vein thrombosis (DVT) of proximal lower extremity, unspecified chronicity, unspecified laterality (HCC) (Primary) The patient has done well status post joint replacement surgery.  Therefore, I recommend that we remove the IVC filter.  Risk and benefits were reviewed all questions answered patient has agreed to proceed.  Patient will continue to elevate to minimize swelling.  Given the history of DVT and the possibility of post phlebitic changes such as swelling and discomfort the patient should wear graduated compression stockings.   The compression should be worn on a daily basis.  In addition, behavioral modification including elevation during the day and avoidance of prolonged dependency is helpful.    The patient will follow-up with me after the filter removal.  2. Benign hypertension Continue antihypertensive medications as already ordered, these medications have been reviewed and there are no changes at this time.   Current Outpatient Medications on File Prior to Visit  Medication Sig Dispense Refill   acetaminophen (TYLENOL) 500 MG tablet Take 2 tablets (1,000 mg total) by mouth every 8 (eight) hours. 30 tablet 0   albuterol (VENTOLIN HFA) 108 (90 Base) MCG/ACT inhaler Inhale 2 puffs into the lungs every 6 (six) hours as needed for shortness of breath or wheezing.     Alum Hydroxide-Mag Carbonate (GAVISCON PO) Take 4 tablets by mouth daily as needed (upset stomach).     apixaban (ELIQUIS) 2.5 MG TABS tablet Take 1 tablet (2.5 mg total) by mouth every 12 (twelve) hours. 60 tablet 0   buPROPion (WELLBUTRIN XL) 150 MG 24 hr tablet Take 1 tablet (150 mg total) by mouth in the morning. 90 tablet 0   busPIRone (BUSPAR) 10 MG tablet Take 10 mg by mouth 2 (two) times daily.     celecoxib (CELEBREX) 200 MG capsule Take 1 capsule (200 mg total) by mouth daily.     cetirizine (ZYRTEC) 10 MG tablet Take 10 mg by mouth daily as needed for allergies.     CLOBETASOL PROPIONATE E 0.05 % emollient cream Apply topically.     desonide (DESOWEN) 0.05 % cream Apply 1 Application topically 2 (two) times daily as needed (morphea flare).  DULoxetine (CYMBALTA) 30 MG capsule Take 1 capsule (30 mg total) by mouth daily.     famotidine-calcium carbonate-magnesium hydroxide (PEPCID COMPLETE) 10-800-165 MG chewable tablet Chew 1 tablet by mouth daily as needed (heartburn).     folic acid (FOLVITE) 1 MG tablet Take 1 mg by mouth daily.     hydrocortisone-pramoxine (ANALPRAM-HC) 2.5-1 % rectal cream Place 1 Application rectally 3 (three) times  daily as needed for hemorrhoids.     hydroxychloroquine (PLAQUENIL) 200 MG tablet Take 200 mg by mouth 2 (two) times daily.     lisinopril (ZESTRIL) 40 MG tablet Take 40 mg by mouth daily.     methotrexate (RHEUMATREX) 2.5 MG tablet Take 3 tablets by mouth 2 (two) times a week. Take 7.5 mg on Mondays and Tuesdays     Multiple Vitamin (MULTIVITAMIN) capsule Take 1 capsule by mouth daily.     omeprazole (PRILOSEC) 40 MG capsule Take 40 mg by mouth daily.     ondansetron (ZOFRAN) 4 MG tablet Take 1 tablet (4 mg total) by mouth every 6 (six) hours as needed for nausea. 20 tablet 0   ondansetron (ZOFRAN-ODT) 8 MG disintegrating tablet Take 8 mg by mouth every 8 (eight) hours as needed for vomiting or nausea.     traZODone (DESYREL) 50 MG tablet Take 1 tablet (50 mg total) by mouth at bedtime as needed for sleep. 90 tablet 0   triamcinolone (KENALOG) 0.025 % cream Apply 1 Application topically 2 (two) times daily as needed (morphea flare).     trospium (SANCTURA) 20 MG tablet Take 1 tablet (20 mg total) by mouth 2 (two) times daily as needed (overactive bladder). 180 tablet 1   No current facility-administered medications on file prior to visit.    There are no Patient Instructions on file for this visit. No follow-ups on file.   Georgiana Spinner, NP

## 2023-12-26 NOTE — Progress Notes (Signed)
Subjective:    Patient ID: Elizabeth Martin, female    DOB: 1961-11-06, 63 y.o.   MRN: [id number] Chief Complaint  Patient presents with   Follow-up    [facility] 6 weeks with dvt study    The patient presents to the office for follow-up status post IVC filter placement prior to joint replacement surgery.  The patient has a history of DVT and therefore was at high risk for recurrence in the perioperative timeframe after undergoing a total joint replacement.    Total hip arthroplasty surgery was on 11/14/2023.  The patient has been doing well with rehab and their mobility is increasing.  The patient notes the affected leg is not increasingly swollen or painful.  No signs and symptoms consistent with recurrence of DVT in the lower extremity.  Patient does note that as a baseline the affected extremity tends to swell with prolonged dependence, symptoms are much better with elevation.  The patient notes minimal edema in the morning which steadily worsens throughout the day.  The patient has not been using compression therapy at this point.  The patient is eliquis 2.5 mg anticoagulation.  No SOB or pleuritic chest pains.  No cough or hemoptysis.  No blood per rectum or blood in any sputum.  No excessive bruising per the patient.   No recent shortening of the patient's walking distance or new symptoms consistent with claudication.  No history of rest pain symptoms. No new ulcers or wounds of the lower extremities have occurred.  The patient denies amaurosis fugax or recent TIA symptoms. There are no recent neurological changes noted. No recent episodes of angina or shortness of breath documented.   No evidence of DVT or superficial thrombophlebitis in the right lower extremity.  There is extensive edema in the right calf with the patient does have known and significant lymphedema in the right lower extremity    Review of Systems  Cardiovascular:  Positive for leg swelling.  All other systems  reviewed and are negative.      Objective:   Physical Exam Vitals reviewed.  HENT:     Head: Normocephalic.  Cardiovascular:     Rate and Rhythm: Normal rate.     Pulses: Normal pulses.  Pulmonary:     Effort: Pulmonary effort is normal.  Musculoskeletal:     Right lower leg: Edema present.  Skin:    General: Skin is warm and dry.  Neurological:     Mental Status: She is alert and oriented to person, place, and time.  Psychiatric:        Mood and Affect: Mood normal.        Behavior: Behavior normal.        Thought Content: Thought content normal.        Judgment: Judgment normal.     BP 118/79   Pulse 92   Resp 16   Wt 167 lb 12.8 oz (76.1 kg)   BMI 27.08 kg/m   Past Medical History:  Diagnosis Date   Anxiety    Asthma    Autoimmune disease (HCC)    Cancer (HCC)    Endometrial lymph node removal (30)   Corneal abrasion, right 05/24/2022   Depression    GERD (gastroesophageal reflux disease)    H/O blood clots    upper rt groin and behind both knees   History of hiatal hernia    Hypertension    Lymphedema    right leg   Morphea scleroderma    PONV (  postoperative nausea and vomiting)    states gets violently ill    Social History   Socioeconomic History   Marital status: Married    Spouse name: Not on file   Number of children: Not on file   Years of education: Not on file   Highest education level: Not on file  Occupational History   Not on file  Tobacco Use   Smoking status: Never   Smokeless tobacco: Never  Vaping Use   Vaping status: Never Used  Substance and Sexual Activity   Alcohol use: Yes    Comment: occ.   Drug use: Never   Sexual activity: Not Currently  Other Topics Concern   Not on file  Social History Narrative   ** Merged History Encounter **       Social Drivers of Health   Financial Resource Strain: Low Risk  (08/20/2023)   Received from [facility]   Overall Financial Resource Strain (CARDIA)    Difficulty of  Paying Living Expenses: Not hard at all  Food Insecurity: No Food Insecurity (11/14/2023)   Hunger Vital Sign    Worried About Running Out of Food in the Last Year: Never true    Ran Out of Food in the Last Year: Never true  Transportation Needs: No Transportation Needs (11/14/2023)   PRAPARE - Administrator, Civil Service (Medical): No    Lack of Transportation (Non-Medical): No  Physical Activity: Not on file  Stress: Not on file  Social Connections: Unknown (02/01/2023)   Received from [facility]   Social Network    Social Network: Not on file  Intimate Partner Violence: Not At Risk (11/14/2023)   Humiliation, Afraid, Rape, and Kick questionnaire    Fear of Current or Ex-Partner: No    Emotionally Abused: No    Physically Abused: No    Sexually Abused: No    Past Surgical History:  Procedure Laterality Date   BASAL CELL CARCINOMA EXCISION Left    CHOLECYSTECTOMY  2008   FLEXIBLE SIGMOIDOSCOPY N/A 05/19/2022   Procedure: FLEXIBLE SIGMOIDOSCOPY;  Surgeon: Toledo, Boykin Nearing, MD;  Location: [facility] ENDOSCOPY;  Service: Gastroenterology;  Laterality: N/A;   HERNIA REPAIR  2004   ILEOSTOMY CLOSURE N/A 08/24/2022   Procedure: ILEOSTOMY TAKEDOWN, open loop with Lynden Oxford, PA-C to assist;  Surgeon: Henrene Dodge, MD;  Location: [facility] ORS;  Service: General;  Laterality: N/A;   IVC FILTER INSERTION N/A 11/09/2023   Procedure: IVC FILTER INSERTION;  Surgeon: Annice Needy, MD;  Location: [facility] INVASIVE CV LAB;  Service: Cardiovascular;  Laterality: N/A;   PLANTAR FASCIECTOMY     ROBOTIC ASSISTED TOTAL HYSTERECTOMY WITH BILATERAL SALPINGO OOPHERECTOMY  02/14/2012   ROTATOR CUFF REPAIR Left 03/24/2022   TONSILLECTOMY     TOTAL HIP ARTHROPLASTY Right 11/14/2023   Procedure: TOTAL HIP ARTHROPLASTY - posterior;  Surgeon: Reinaldo Berber, MD;  Location: [facility] ORS;  Service: Orthopedics;  Laterality: Right;   XI ROBOTIC ASSISTED LOWER ANTERIOR RESECTION N/A 05/24/2022    Procedure: XI ROBOTIC ASSISTED LOWER ANTERIOR RESECTION;  Surgeon: Henrene Dodge, MD;  Location: [facility] ORS;  Service: General;  Laterality: N/A;    Family History  Problem Relation Age of Onset   Mental illness Neg Hx     Allergies  Allergen Reactions   Carboplatin Anaphylaxis        Hydromorphone Anaphylaxis    Stopped breathing  Respiratory arrest - due to dosage administered per the patient.   Latex Hives,  Itching and Rash   Nitrofurantoin Nausea And Vomiting   Silicone Itching and Rash   Wheat Other (See Comments)    Body aches and swelling        Latest Ref Rng & Units 11/15/2023    5:40 AM 11/01/2023   11:10 AM 02/12/2023    6:24 AM  CBC  WBC 4.0 - 10.5 K/uL 14.0  8.7  8.9   Hemoglobin 12.0 - 15.0 g/dL 16.1  09.6  04.5   Hematocrit 36.0 - 46.0 % 31.6  37.4  36.3   Platelets 150 - 400 K/uL 232  366  279       CMP     Component Value Date/Time   NA 139 11/15/2023 0540   K 4.2 11/15/2023 0540   CL 105 11/15/2023 0540   CO2 24 11/15/2023 0540   GLUCOSE 140 (H) 11/15/2023 0540   BUN 10 11/15/2023 0540   CREATININE 0.76 11/15/2023 0540   CALCIUM 8.2 (L) 11/15/2023 0540   PROT 6.1 (L) 11/01/2023 1110   ALBUMIN 3.8 11/01/2023 1110   AST 18 11/01/2023 1110   ALT 19 11/01/2023 1110   ALKPHOS 57 11/01/2023 1110   BILITOT 0.6 11/01/2023 1110   GFRNONAA >60 11/15/2023 0540     No results found.     Assessment & Plan:   1. Deep vein thrombosis (DVT) of proximal lower extremity, unspecified chronicity, unspecified laterality (HCC) (Primary) The patient has done well status post joint replacement surgery.  Therefore, I recommend that we remove the IVC filter.  Risk and benefits were reviewed all questions answered patient has agreed to proceed.  Patient will continue to elevate to minimize swelling.  Given the history of DVT and the possibility of post phlebitic changes such as swelling and discomfort the patient should wear graduated compression stockings.   The compression should be worn on a daily basis.  In addition, behavioral modification including elevation during the day and avoidance of prolonged dependency is helpful.    The patient will follow-up with me after the filter removal.  2. Benign hypertension Continue antihypertensive medications as already ordered, these medications have been reviewed and there are no changes at this time.   Current Outpatient Medications on File Prior to Visit  Medication Sig Dispense Refill   acetaminophen (TYLENOL) 500 MG tablet Take 2 tablets (1,000 mg total) by mouth every 8 (eight) hours. 30 tablet 0   albuterol (VENTOLIN HFA) 108 (90 Base) MCG/ACT inhaler Inhale 2 puffs into the lungs every 6 (six) hours as needed for shortness of breath or wheezing.     Alum Hydroxide-Mag Carbonate (GAVISCON PO) Take 4 tablets by mouth daily as needed (upset stomach).     apixaban (ELIQUIS) 2.5 MG TABS tablet Take 1 tablet (2.5 mg total) by mouth every 12 (twelve) hours. 60 tablet 0   buPROPion (WELLBUTRIN XL) 150 MG 24 hr tablet Take 1 tablet (150 mg total) by mouth in the morning. 90 tablet 0   busPIRone (BUSPAR) 10 MG tablet Take 10 mg by mouth 2 (two) times daily.     celecoxib (CELEBREX) 200 MG capsule Take 1 capsule (200 mg total) by mouth daily.     cetirizine (ZYRTEC) 10 MG tablet Take 10 mg by mouth daily as needed for allergies.     CLOBETASOL PROPIONATE E 0.05 % emollient cream Apply topically.     desonide (DESOWEN) 0.05 % cream Apply 1 Application topically 2 (two) times daily as needed (morphea flare).  DULoxetine (CYMBALTA) 30 MG capsule Take 1 capsule (30 mg total) by mouth daily.     famotidine-calcium carbonate-magnesium hydroxide (PEPCID COMPLETE) 10-800-165 MG chewable tablet Chew 1 tablet by mouth daily as needed (heartburn).     folic acid (FOLVITE) 1 MG tablet Take 1 mg by mouth daily.     hydrocortisone-pramoxine (ANALPRAM-HC) 2.5-1 % rectal cream Place 1 Application rectally 3 (three) times  daily as needed for hemorrhoids.     hydroxychloroquine (PLAQUENIL) 200 MG tablet Take 200 mg by mouth 2 (two) times daily.     lisinopril (ZESTRIL) 40 MG tablet Take 40 mg by mouth daily.     methotrexate (RHEUMATREX) 2.5 MG tablet Take 3 tablets by mouth 2 (two) times a week. Take 7.5 mg on Mondays and Tuesdays     Multiple Vitamin (MULTIVITAMIN) capsule Take 1 capsule by mouth daily.     omeprazole (PRILOSEC) 40 MG capsule Take 40 mg by mouth daily.     ondansetron (ZOFRAN) 4 MG tablet Take 1 tablet (4 mg total) by mouth every 6 (six) hours as needed for nausea. 20 tablet 0   ondansetron (ZOFRAN-ODT) 8 MG disintegrating tablet Take 8 mg by mouth every 8 (eight) hours as needed for vomiting or nausea.     traZODone (DESYREL) 50 MG tablet Take 1 tablet (50 mg total) by mouth at bedtime as needed for sleep. 90 tablet 0   triamcinolone (KENALOG) 0.025 % cream Apply 1 Application topically 2 (two) times daily as needed (morphea flare).     trospium (SANCTURA) 20 MG tablet Take 1 tablet (20 mg total) by mouth 2 (two) times daily as needed (overactive bladder). 180 tablet 1   No current facility-administered medications on file prior to visit.    There are no Patient Instructions on file for this visit. No follow-ups on file.   Georgiana Spinner, NP

## 2023-12-26 NOTE — Therapy (Signed)
OUTPATIENT OCCUPATIONAL THERAPY TREATMENT NOTE  LOWER EXTREMITY LYMPHEDEMA  Patient Name: Elizabeth Martin MRN: [id number] DOB:[birth year], 63 y.o., female Today's Date: 12/26/2023  END OF SESSION:  Lymphedema Episode 2   OT End of Session - 12/26/23 1237     Visit Number 8    Number of Visits 36    Date for OT Re-Evaluation 01/09/24    OT Start Time 0900    OT Stop Time 1010    OT Time Calculation (min) 70 min    Activity Tolerance Patient tolerated treatment well;No increased pain    Behavior During Therapy WFL for tasks assessed/performed              Past Medical History:  Diagnosis Date   Anxiety    Asthma    Autoimmune disease (HCC)    Cancer (HCC)    Endometrial lymph node removal (30)   Corneal abrasion, right 05/24/2022   Depression    GERD (gastroesophageal reflux disease)    H/O blood clots    upper rt groin and behind both knees   History of hiatal hernia    Hypertension    Lymphedema    right leg   Morphea scleroderma    PONV (postoperative nausea and vomiting)    states gets violently ill   Past Surgical History:  Procedure Laterality Date   BASAL CELL CARCINOMA EXCISION Left    CHOLECYSTECTOMY  2008   FLEXIBLE SIGMOIDOSCOPY N/A 05/19/2022   Procedure: FLEXIBLE SIGMOIDOSCOPY;  Surgeon: Toledo, Boykin Nearing, MD;  Location: [facility];  Service: Gastroenterology;  Laterality: N/A;   HERNIA REPAIR  2004   ILEOSTOMY CLOSURE N/A 08/24/2022   Procedure: ILEOSTOMY TAKEDOWN, open loop with Lynden Oxford, PA-C to assist;  Surgeon: Henrene Dodge, MD;  Location: [facility];  Service: General;  Laterality: N/A;   IVC FILTER INSERTION N/A 11/09/2023   Procedure: IVC FILTER INSERTION;  Surgeon: Annice Needy, MD;  Location: [facility];  Service: Cardiovascular;  Laterality: N/A;   PLANTAR FASCIECTOMY     ROBOTIC ASSISTED TOTAL HYSTERECTOMY WITH BILATERAL SALPINGO OOPHERECTOMY  02/14/2012   ROTATOR CUFF REPAIR Left 03/24/2022   TONSILLECTOMY     TOTAL  HIP ARTHROPLASTY Right 11/14/2023   Procedure: TOTAL HIP ARTHROPLASTY - posterior;  Surgeon: Reinaldo Berber, MD;  Location: [facility];  Service: Orthopedics;  Laterality: Right;   XI ROBOTIC ASSISTED LOWER ANTERIOR RESECTION N/A 05/24/2022   Procedure: XI ROBOTIC ASSISTED LOWER ANTERIOR RESECTION;  Surgeon: Henrene Dodge, MD;  Location: [facility];  Service: General;  Laterality: N/A;   Patient Active Problem List   Diagnosis Date Noted   S/P total right hip arthroplasty 11/14/2023   Blood coagulation disorder (HCC) 11/10/2023   PTSD (post-traumatic stress disorder) 11/10/2023   Severe episode of recurrent major depressive disorder, without psychotic features (HCC) 11/10/2023   GAD (generalized anxiety disorder) 11/10/2023   High risk medication use 11/10/2023   Anxiety and depression 03/22/2023   Cervical radiculopathy 03/16/2023   Colon cancer screening 03/15/2023   Chronic idiopathic constipation 03/15/2023   Dyspnea 03/07/2023   [place] spotted fever 03/07/2023   Strain of gastrocnemius tendon 03/07/2023   Lower abdominal pain 03/07/2023   Pain in pelvis 03/07/2023   Small bowel obstruction (HCC) 02/11/2023   Raynaud disease 02/11/2023   Polyarthralgia 02/11/2023   Edema 01/03/2023   Diverticular disease 12/10/2022   Hot flashes 12/10/2022   Pain of right lower extremity 12/10/2022   Paresthesia of lower extremity 12/10/2022   Primary insomnia 12/10/2022  Thyroid nodule 12/10/2022   Multiple joint pain 12/10/2022   Basal cell carcinoma of skin 12/10/2022   Acute bilateral low back pain without sciatica 11/10/2022   Chronic lower back pain 09/05/2022   S/P closure of ileostomy 08/24/2022   Ileostomy in place [facility])    Morphea 07/13/2022   Colonic stricture (HCC) 05/19/2022   Large bowel obstruction (HCC) 05/19/2022   Hypokalemia 05/18/2022   Abdominal pain 05/18/2022   Osteoarthritis of left knee 02/24/2022   Mixed connective tissue disease (HCC) 02/17/2022    Adhesive capsulitis of left shoulder 01/27/2022   Glenohumeral arthritis, left 01/27/2022   Microscopic hematuria 01/12/2022   Otorrhagia of right ear 12/10/2021   Elevated testosterone level in female 11/04/2021   Anti-TPO antibodies present 10/20/2021   Rash 10/13/2021   Hashimoto thyroiditis, 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   Hashimoto's disease 03/23/2021   Exposure to severe acute respiratory syndrome coronavirus 2 (SARS-CoV-2) 12/19/2020   Migraine without aura and without status migrainosus, not intractable 08/19/2020   Myofascial pain 07/30/2020   Neck pain 07/30/2020   Upper back pain 07/30/2020   Cervical facet joint syndrome 07/30/2020   Hair loss 07/14/2020   Edema of lower extremity 07/11/2020   Adenomatous colon polyp 02/26/2020   Gastroesophageal reflux disease 02/15/2020   Diarrhea 02/15/2020   Sleep disorder 01/16/2020   Hyperlipidemia 07/11/2019   Right shoulder pain 04/04/2019   Pre-operative clearance 12/13/2018   Endometrial cancer (HCC) 04/05/2018   Patellofemoral pain syndrome of right knee 03/20/2018   Gastroenteritis 12/07/2017   Fatigue 11/16/2017   Morbid obesity (HCC) 08/11/2017   Stucco keratoses 08/11/2017   Essential hypertension 08/09/2017   History of endometrial cancer 08/09/2017   Lymphedema 08/09/2017   Menopause 08/09/2017   Malignant neoplastic disease (HCC) 04/07/2017   Cellulitis 09/18/2016   Benign hypertension 06/02/2016   Asthma 06/04/1969    PCP: Pennie Banter, MD  REFERRING PROVIDER: Pennie Banter, MD  REFERRING DIAG: I89.0   THERAPY DIAG:  Lymphedema, not elsewhere classified  Rationale for Evaluation and Treatment: Rehabilitation  ONSET DATE: 2013  (Cancer-related, endometrial 2013)  SUBJECTIVE:                                                                                                                                                                                           SUBJECTIVE STATEMENT:Elizabeth Martin  presents to OT for lymphedema care to her RLE/RLQ. Pt has resumed OT after R total hip arthroplasty to limit limb swelling and lymphedema progression.  which has healed externally. She does not rate LE-related pain today, but says it feels less heavy,  Pt's spouse continues to assist her with compression  wrapping with short stretch bandages between visits. Pt continues to need extra time for dressing due to painful hip AROM.Marland Kitchen  PERTINENT HISTORY: Asthma, Autoimmune Disease, Endometrial Ca w/ LND ( 30 bilateral pelvic and periaortic LN), adjuvant chemotherapy and XRT,   H/O blood clots, RLE/RLQ lymphedema, Robotic assisted total hysterectomy w/ bilateral oophorectomy L Rotator Cuff repair, S/P ileostomy 2/2 colon stricture 05/2022 R hip arthroplasty scheduled 11/11/23.   PAIN:  Are you having pain? RLE lymphedema, Yes: NPRS scale: R HIP not rated /10 Pain location: RLE, including hip Pain description: aching, heavy, full, tight Aggravating factors: standing, walking, extended dependent sitting Relieving factors: elevation, movement  PRECAUTIONS: Fall and Other: LYMPHEDEMA  WEIGHT BEARING RESTRICTIONS: Yes 5# lifting restriction- shoulder  FALLS:  Has patient fallen in last 6 months? No  LIVING ENVIRONMENT: Lives with: lives with their spouse Lives in: House/apartment Stairs: No;  Has following equipment at home: Single point cane, Environmental consultant - 2 wheeled, Environmental consultant - 4 wheeled, and Wheelchair (manual)  OCCUPATION: retired Control and instrumentation engineer: gardening, hiking, biking, dancing- unable to participate in any of these due to pain and swelling  HAND DOMINANCE: right   PRIOR LEVEL OF FUNCTION: Independent with basic ADLs, Independent with household mobility without device, Independent with community mobility without device, Requires assistive device for independence, Needs assistance with homemaking, Needs assistance with transfers, and Leisure: decreased  social participation for leisure pursuits due to  impaired mobility and pain  PATIENT GOALS:  Be able to move freely without pain Reduce limb volume to be able to lift limb for basic daily ADLs and functional ambulation Reduce limb volume to increase ability to perform lower body dressing and bathing and grooming Reduce limb to increase body image  OBJECTIVE: Note: Objective measures were completed at Evaluation unless otherwise noted.  COGNITION:  Overall cognitive status: Within functional limits for tasks assessed   OBSERVATIONS / OTHER ASSESSMENTS:   POSTURE: WFL  LE ROM: WFL, but Limited mildly at R knee and ankle due to girth, skin approximation, and joint pain  LE MMT: WFL  Mild, Stage  II, Bilateral Lower Extremity Lymphedema 2/2 CVI and Obesity  Skin  Description Hyper-Keratosis Peau d' Orange Shiny Tight Fibrotic/ Indurated Fatty Doughy Spongy/ boggy   x x x x R>L   x   Skin dry Flaky WNL Macerated   mildly sclerotic     Color Redness Varicosities Blanching Hemosiderin Stain Mottled    x x   x   Odor Malodorous Yeast Fungal infection  WNL      x   Temperature Warm Cool wnl    x     Pitting Edema   1+ 2+ 3+ 4+ Non-pitting         x   Girth Symmetrical Asymmetrical                   Distribution    R>L RLE toes to groin    Stemmer Sign Positive Negative   +    Lymphorrhea History Of:  Present Absent     x    Wounds History Of Present Absent Venous Arterial Pressure Sheer     x        Signs of Infection Redness Warmth Erythema Acute Swelling Drainage Borders                    Sensation Light Touch Deep pressure Hypersensitivity   In tact Impaired In tact Impaired Absent Impaired   x  x  x     Nails WNL   Fungus nail dystrophy   x     Hair Growth Symmetrical Asymmetrical    R>L   Skin Creases Base of toes  Ankles   Base of Fingers knees       Abdominal pannus Thigh Lobules  Face/neck   x x  x        BLE COMPARATIVE LIMB VOLUMETRICS 10/10/23  LANDMARK RIGHT  10/10/23   R LEG (A-D) 5466.5 ml  R THIGH (E-G) 9186.6 ml  R FULL LIMB (A-G) 14653.1 ml  Limb Volume differential (LVD)  LVD for LEG = 44.1%, R>L LVD for THIGH= 33.98%, R>L LVD for FULL lower extremity 37.8%, R>L  Volume change since last 08/11/22 R LEG is INCREASED in volume by 59.6%. L THIGH is INCREASED by 38 %, and RLE full limb is INCREASED by 45.38%.  Volume change overall V  (Blank rows = not tested)  LANDMARK LEFT  10/12/23  L LEG (A-D) 3053.6 ml  L THIGH (E-G) 6064.8 ml  L  FULL LIMB (A-G) 9118.4 ml  Limb Volume differential (LVD)  %  Volume change since initial %  Volume change overall %  (Blank rows = not tested)   CA-related LYMPHEDEMA Hx:  SURGERY TYPE/DATE: 2013 NUMBER OF LYMPH NODES REMOVED: 30 by report- bilateral pelvic and periaortic CHEMOTHERAPY: yes RADIATION:yes INFECTIONS: no hx cellulitis GAIT: Distance walked: >500 ft Assistive device utilized: single point cane Level of assistance: Modified independence- extra time Comments: R limp  LYMPHEDEMA LIFE IMPACT SCALE (LLIS): Intake 10/12/23 75% (The extent to which lymphedema related problems impacted your life over the past week)  FOTO (functional outcome measure):10/10/23 INTAKE: 36%  PATIENT EDUCATION:  Education details: Continued Pt/ CG edu for lymphedema self care home program throughout session. Topics include outcome of comparative limb volumetrics- starting limb volume differentials (LVDs), technology and gradient techniques used for short stretch, multilayer compression wrapping, simple self-MLD, therapeutic lymphatic pumping exercises, skin/nail care, LE precautions,. compression garment recommendations and specifications, wear and care schedule and compression garment donning / doffing w assistive devices. Discussed progress towards all OT goals since commencing CDT. All questions answered to the Pt's satisfaction. Good return. Person educated: Patient Education method: Explanation, Demonstration, and  Handouts Education comprehension: verbalized understanding, returned demonstration, and needs further education  LE SELF-CARE HOME  PROGRAM: Simple self-MLD/daily to affected quadrant and body part At least 2 x daily BLE Lymphatic Pumping There ex 1 set of 10 reps each, in order, bilaterally Daily skin care to affected body part to limit infection risk and increase skin excursion Compression Bandaging Intensive stage compression: multilayer short stretch wraps with gradient techniques. One limb at a time. Length patient dependent. Self-management Phase: Appropriate daytime compression garment and hours-of-sleep device  Compression garments: Custom-made gradient compression garments and hours-of-sleep devices are medically necessary because they are uniquely sized and shaped to fit the exact dimensions of the affected extremities and to provide accurate and consistent gradient compression and containment, essential  for optimally managing chronic, progressive lymphedema. The convoluted HOS devices are medically necessary to facilitate increased lymphatic circulation and limit fibrosis formation when sleeping. Multiple custom compression garments are needed for optimal hygiene to limit infection risk. Custom compression garments should be replaced q 3-6 months When worn consistently for optimal lipo-lymphedema self-management over time.  Pt will benefit from [id number]- Circaid reduction kit whole leg to increase independence with lymphedema self care, to  reduce limb volume  for body symmetry and balance, and to  limit infection risk and progression.  ASSESSMENT:  CLINICAL IMPRESSION: RLE/ RLQ remains moderately swollen but with density continuing to reduce.  Swelling distribution is throughout the entire le and includes the buttock and lower R abdomen, and extends to toes. Emphasis of visit on manual lymphatic drainage towards ipsilateral axillary lymph nodes, functional inguinal nodes,  and deep  abdominal lymphatics.  Applied full limb  multilayer, gradient compression wraps  after manual therapy from base of toes to groin. Cont as per POC to assist with healing, to reduce limb swelling and progression of tissue hardening due to both lymphedema and auto immune issues, and to assist with normalizing functional ambulation and mobility. Priority is reducing swelling to enable Pt to resume wearing custom compression garments ASAP to reduce bulky wraps.   10/10/23 Lymphedema Episode 2, Initial OT Evaluation : Jarethzy Fittipaldi is a 51 y a female presenting with chronic, progressive, moderate, Stage II, RLE/RLQ cancer -related lymphedema with onset 11 years ago after Rx for endometrial cancer. Pt is well known to this therapist as she has successfully undergone Complete Decongestive Therapy (CDT)  this clinic bin the past. Pt reports recent exacerbation of RLE swelling worsened as inflammation in L hip has worsened over time.  Pt has a hip replacement scheduled in late December here it [facility] RE limb volumetrics today reveal that R LEG volume is dramatically increased by 59.65 since last visit. R LEG VOLUME is INCREASED in volume by 59.6%. L THIGH is INCREASED by 38 %, and RLE full limb is INCREASED by 45.38%. Pt presents with LE-related skin changes. Prolonged inflammation in the R hip joint has likely overloaded  already RLE/RLQ lymphatics.  RLE/RLQ lymphedema limits Pt's ability to perform basic and instrumental ADLs, including functional ambulation, mobility and transfers, grooming , lower body bathing and dressing, skin inspection and skin care. Pt has difficulty  reaching her lower legs and feet to apply compression wraps and don/doff        compression stockings. LE limits ability to P[perform instrumental ADLs, including driving, yard work and home management activities. Lymphedema limits her ability to participate in leisure pursuits and productive activities, and it negatively impacts body image, life  roles and quality of life.   Pt will benefit from an Intensive and follow along course of lymphedema. CDT will consist of Manual Lymphatic gainage   (MLD), skin care, therapeutic exercise and compression, wraps initially then garments. Pt will need assistance throughout CDT   for applying compression wraps due to limited hip AROM and decreased skin flexibility. Without skilled Occupational Therapy for lymphedema care, lymphedema will progress and further functional decline is expected.   In prep for upcoming hip replacement OT will educate Pt re assistive devices, including tub transfer bench and elevated toilet seat, in keeping with hip precautions.  OBJECTIVE IMPAIRMENTS: Abnormal gait, decreased activity tolerance, decreased balance, decreased knowledge of use of DME, decreased mobility, difficulty walking, decreased ROM, decreased strength, increased edema, decreased skin flexibility, increased fascial restrictions, impaired sensation, pain, and chronic, progressive LLE/LLQ lymphatic swelling and associated pain.   ADL LIMITATIONS: carrying, lifting, bending, sitting, standing, squatting, sleeping, stairs, transfers, bed mobility, bathing, dressing, hygiene/grooming, and productive activities, leisure pursuits, social participation, body image  driving, shopping, housework, yard work, cooking, meal prep  PERSONAL FACTORS: Past/current experiences and 3+ comorbidities: Morphea Scleroderma, Mixed connective tissue disease, and osteoarthritis  are also affecting patient's functional outcome.   REHAB POTENTIAL: Good  EVALUATION COMPLEXITY: Moderate  GOALS: Goals reviewed with patient? Yes  SHORT  TERM GOALS: Target date: 4th OT Rx visit   Pt will demonstrate understanding of lymphedema precautions and prevention strategies with modified independence using a printed reference to identify at least 5 precautions and discussing how s/he may implement them into daily life to reduce risk of progression  with extra time.  Baseline:Max A Goal status: GOAL MET  2.  Pt will be able to apply multilayer, knee length, gradient, compression wraps to one leg at a time with modified assistance (extra time and assistive device/s) to decrease limb volume, to limit infection risk, and to limit lymphedema progression.  Baseline: Dependent Goal status: GOAL MET  LONG TERM GOALS: Target date: 01/09/24 (12 weeks)  Given this patient's Intake score 36% on the functional outcomes FOTO tool, patient will experience an increase in function of 3 points to improve basic and instrumental ADLs performance, including lymphedema self-care.  Baseline: Max A Goal status: INITIAL  2.  Given this patient's Intake score of  75% on the Lymphedema Life Impact Scale (LLIS), patient will experience a reduction of at least 5 points in her perceived level of functional impairment resulting from lymphedema to improve functional performance and quality of life (QOL). Baseline: % Goal status: ONGOING  3.  Pt will achieve at least a 10% volume reduction in full, RLE limb volume to return limb to typical size and shape, to limit infection risk and LE progression, to decrease pain, to improve function. Baseline: Dependent Goal status:ONGOING  4.  Pt will obtain proper compression garments/devices and achieve modified independence (extra time + assistive devices) with donning/doffing to optimize limb volume reductions and limit LE  progression over time. Baseline:  Goal status: ONGOING  5.  During Intensive phase CDT , with modified independence, Pt will achieve at least 85% compliance with all lymphedema self-care home program components, including daily skin care, compression wraps and /or garments, simple self MLD and lymphatic pumping therex to habituate LE self care protocol  into ADLs for optimal LE self-management over time. Baseline: Dependent Goal status: ONGOING PLAN:  OT FREQUENCY: 2x/week  OT DURATION: 12 weeks and  PRN  PLANNED INTERVENTIONS: Complete Decongestive Therapy (Intensive and supported Self-Management Phases), 97110-Therapeutic exercises, 97530- Therapeutic activity, 97535- Self Care, [id number]- Manual therapy, Patient/Family education, Taping, Manual lymph drainage, Scar mobilization, Compression bandaging, DME instructions, and skin care to reduce infection risk throughout manual therapy. Fit with replacement compression garments ASAP  PLAN FOR NEXT SESSION:  Commence full limb multilayer compression wrapping using gradient bandaging techniques Pt and family edu for LE self-care home program- Bandaging   Loel Dubonnet, MS, OTR/L, CLT-LANA 12/26/23 12:40 PM  12/26/2023, 12:40 PM

## 2023-12-27 NOTE — Telephone Encounter (Signed)
FYI

## 2023-12-28 ENCOUNTER — Ambulatory Visit: Admitting: Occupational Therapy

## 2023-12-29 ENCOUNTER — Ambulatory Visit (INDEPENDENT_AMBULATORY_CARE_PROVIDER_SITE_OTHER): Admitting: Licensed Clinical Social Worker

## 2023-12-29 DIAGNOSIS — F4310 Post-traumatic stress disorder, unspecified: Secondary | ICD-10-CM | POA: Diagnosis not present

## 2023-12-29 DIAGNOSIS — F332 Major depressive disorder, recurrent severe without psychotic features: Secondary | ICD-10-CM

## 2023-12-29 DIAGNOSIS — F411 Generalized anxiety disorder: Secondary | ICD-10-CM

## 2023-12-29 NOTE — Progress Notes (Signed)
THERAPIST PROGRESS NOTE  Virtual Visit via Video Note  I connected with Elizabeth Martin on 12/29/23 at 11:03pm by a video enabled telemedicine application and verified that I am speaking with the correct person using two identifiers.  Location: Patient: Address on file  Provider: [facility]   I discussed the limitations of evaluation and management by telemedicine and the availability of in person appointments. The patient expressed understanding and agreed to proceed.   I discussed the assessment and treatment plan with the patient. The patient was provided an opportunity to ask questions and all were answered. The patient agreed with the plan and demonstrated an understanding of the instructions.   The patient was advised to call back or seek an in-person evaluation if the symptoms worsen or if the condition fails to improve as anticipated.  I provided 48 minutes of non-face-to-face time during this encounter.   Dereck Leep, LCSW   Session Time: 11:03am-11:51am  Participation Level: Active  Behavioral Response: CasualAlertEuthymic  Type of Therapy: Individual Therapy  Treatment Goals addressed:  Goal: LTG: Elimination of maladaptive behaviors and thinking patterns which interfere with resolution of trauma as evidenced by self report                                  Goal: LTG: Develop and implement effective coping skills to carry out normal responsibilities and participate constructively in relationships as evidenced by self report                               Goal: LTG: Recall traumatic events without becoming overwhelmed with negative emotions        ProgressTowards Goals: Progressing  Interventions: Supportive and Other: EMDR  Summary: Elizabeth Martin is a 63 y.o. female who presents with symptoms of depression and trauma. Patient identifies symptoms to include reexperiencing, uncontrollable worry, tearfulness, avoidance, hypervigilance. Pt was oriented  times 5. Pt was cooperative and engaged. Pt denies SI/HI/AVH.   Pt utilized therapeutic space to process frustrations with seeking medical care and feeling unheard by her doctors. Discussed complication with her chronic disorders. Reviewed self-care habits in the midst of chaotic recent encounters with medical physicians. Explored diet and ways she is caring for her physical well being during this time. Pt celebrated her goal to walk in [place] during her vacation come March.   Reports she has not experienced trauma sxs since her last session. Cites success due to use of her container, her peaceful place, and belly breathing. Denies nightmares or new triggers. Reports she has experienced difficulties sleeping and when she took her trazodone, she experienced headaches.   Pt began processing her target sequence plan through EMDR processing her touchstone memory. Pt reports desensitization from 10 to 5. Closed by visiting her Zen plan. Debriefed from session and explored patients experiencing with empathy and understanding the significance of generational trauma.   Suicidal/Homicidal: Nowithout intent/plan  Therapist Response: Clinician used active and supportive reflection to create a safe environment for patient to process recent life stressors.  Clinician assessed for current symptoms, stressors, safety since last session.  Clinician continued EMDR by working with pt in processing memories on her target sequence plan.   Plan: Return again in 1 week.  Diagnosis: PTSD (post-traumatic stress disorder)  Severe episode of recurrent major depressive disorder, without psychotic features (HCC)  GAD (generalized anxiety disorder)   Collaboration  of Care: [facility]EB psychiatrist can access notes and cln. Will review psychiatrists' notes. Check in with the patient and will see LCSW per availability. Patient agreed with treatment recommendations.  Patient/Guardian was advised Release of Information must be  obtained prior to any record release in order to collaborate their care with an outside provider. Patient/Guardian was advised if they have not already done so to contact the registration department to sign all necessary forms in order for [place] to release information regarding their care.   Consent: Patient/Guardian gives verbal consent for treatment and assignment of benefits for services provided during this visit. Patient/Guardian expressed understanding and agreed to proceed.   Dereck Leep, LCSW 12/29/2023

## 2023-12-30 ENCOUNTER — Telehealth (INDEPENDENT_AMBULATORY_CARE_PROVIDER_SITE_OTHER): Payer: Self-pay

## 2023-12-30 NOTE — Telephone Encounter (Signed)
Spoke with the patient and she is scheduled on 01/02/24 with a 11:30 am arrival time to the [facility] for IVC filter removal with Dr. Wyn Quaker. Pre-procedure instructions were discussed and will be sent to Mychart.

## 2024-01-02 ENCOUNTER — Encounter
Admission: RE | Disposition: A | Discharge status: Home / Self Care | Payer: Self-pay | Source: Ambulatory Visit | Attending: Vascular Surgery

## 2024-01-02 ENCOUNTER — Ambulatory Visit: Admitting: Occupational Therapy

## 2024-01-02 ENCOUNTER — Other Ambulatory Visit: Payer: Self-pay

## 2024-01-02 ENCOUNTER — Encounter: Payer: Self-pay | Admitting: Vascular Surgery

## 2024-01-02 ENCOUNTER — Ambulatory Visit
Admission: RE | Admit: 2024-01-02 | Discharge: 2024-01-02 | Disposition: A | Discharge status: Home / Self Care | Payer: 59 | Source: Ambulatory Visit | Attending: Vascular Surgery | Admitting: Vascular Surgery

## 2024-01-02 ENCOUNTER — Encounter: Payer: Self-pay | Admitting: Occupational Therapy

## 2024-01-02 DIAGNOSIS — Z86718 Personal history of other venous thrombosis and embolism: Secondary | ICD-10-CM | POA: Diagnosis not present

## 2024-01-02 DIAGNOSIS — Z9889 Other specified postprocedural states: Secondary | ICD-10-CM

## 2024-01-02 DIAGNOSIS — I10 Essential (primary) hypertension: Secondary | ICD-10-CM | POA: Diagnosis not present

## 2024-01-02 DIAGNOSIS — I890 Lymphedema, not elsewhere classified: Secondary | ICD-10-CM

## 2024-01-02 DIAGNOSIS — Z7901 Long term (current) use of anticoagulants: Secondary | ICD-10-CM | POA: Diagnosis not present

## 2024-01-02 DIAGNOSIS — I82409 Acute embolism and thrombosis of unspecified deep veins of unspecified lower extremity: Secondary | ICD-10-CM

## 2024-01-02 DIAGNOSIS — Z4589 Encounter for adjustment and management of other implanted devices: Secondary | ICD-10-CM | POA: Diagnosis present

## 2024-01-02 HISTORY — PX: IVC FILTER REMOVAL: CATH118246

## 2024-01-02 SURGERY — IVC FILTER REMOVAL
Anesthesia: Moderate Sedation

## 2024-01-02 MED ORDER — FENTANYL CITRATE (PF) 100 MCG/2ML IJ SOLN
INTRAMUSCULAR | Status: DC | PRN
  Administered 2024-01-02: 50 ug via INTRAVENOUS
  Administered 2024-01-02: 25 ug via INTRAVENOUS

## 2024-01-02 MED ORDER — FAMOTIDINE 20 MG PO TABS: 40.0000 mg | ORAL_TABLET | Freq: Once | ORAL | Status: DC | PRN

## 2024-01-02 MED ORDER — METHYLPREDNISOLONE SODIUM SUCC 125 MG IJ SOLR: 125.0000 mg | Freq: Once | INTRAMUSCULAR | Status: DC | PRN

## 2024-01-02 MED ORDER — LIDOCAINE-EPINEPHRINE (PF) 1 %-1:200000 IJ SOLN
INTRAMUSCULAR | Status: DC | PRN
  Administered 2024-01-02: 10 mL

## 2024-01-02 MED ORDER — ONDANSETRON HCL 4 MG/2ML IJ SOLN: 4.0000 mg | Freq: Four times a day (QID) | INTRAMUSCULAR | Status: DC | PRN

## 2024-01-02 MED ORDER — DIPHENHYDRAMINE HCL 50 MG/ML IJ SOLN: 50.0000 mg | Freq: Once | INTRAMUSCULAR | Status: DC | PRN

## 2024-01-02 MED ORDER — MIDAZOLAM HCL 2 MG/ML PO SYRP: 8.0000 mg | ORAL_SOLUTION | Freq: Once | ORAL | Status: DC | PRN

## 2024-01-02 MED ORDER — HEPARIN (PORCINE) IN NACL 1000-0.9 UT/500ML-% IV SOLN
INTRAVENOUS | Status: DC | PRN
  Administered 2024-01-02: 500 mL

## 2024-01-02 MED ORDER — IODIXANOL 320 MG/ML IV SOLN
INTRAVENOUS | Status: DC | PRN
  Administered 2024-01-02: 10 mL

## 2024-01-02 MED ORDER — FENTANYL CITRATE (PF) 100 MCG/2ML IJ SOLN
INTRAMUSCULAR | Status: AC
  Filled 2024-01-02: qty 2

## 2024-01-02 MED ORDER — SODIUM CHLORIDE 0.9 % IV SOLN: INTRAVENOUS | Status: DC

## 2024-01-02 MED ORDER — MIDAZOLAM HCL 2 MG/2ML IJ SOLN
INTRAMUSCULAR | Status: DC | PRN
  Administered 2024-01-02: 2 mg via INTRAVENOUS
  Administered 2024-01-02: 1 mg via INTRAVENOUS

## 2024-01-02 MED ORDER — MIDAZOLAM HCL 2 MG/2ML IJ SOLN
INTRAMUSCULAR | Status: AC
  Filled 2024-01-02: qty 4

## 2024-01-02 MED ORDER — CEFAZOLIN SODIUM-DEXTROSE 2-4 GM/100ML-% IV SOLN: 2.0000 g | INTRAVENOUS | Status: DC

## 2024-01-02 SURGICAL SUPPLY — 4 items
COVER PROBE ULTRASOUND 5X96 (MISCELLANEOUS) IMPLANT
KIT SNARE RETRIEVAL 3-LOOP (MISCELLANEOUS) IMPLANT
PACK ANGIOGRAPHY (CUSTOM PROCEDURE TRAY) ×1 IMPLANT
WIRE GUIDERIGHT .035X150 (WIRE) IMPLANT

## 2024-01-02 NOTE — Therapy (Signed)
OUTPATIENT OCCUPATIONAL THERAPY TREATMENT NOTE  LOWER EXTREMITY LYMPHEDEMA  Patient Name: Elizabeth Martin MRN: [id number] DOB:07/23/1961, 63 y.o., female Today's Date: 01/02/2024  END OF SESSION:  Lymphedema Episode 2   OT End of Session - 01/02/24 0811     Visit Number 9    Number of Visits 36    Date for OT Re-Evaluation 01/09/24    OT Start Time 0804    OT Stop Time 0902    OT Time Calculation (min) 58 min    Activity Tolerance Patient tolerated treatment well;No increased pain    Behavior During Therapy WFL for tasks assessed/performed              Past Medical History:  Diagnosis Date   Anxiety    Asthma    Autoimmune disease (HCC)    Cancer (HCC)    Endometrial lymph node removal (30)   Corneal abrasion, right 05/24/2022   Depression    GERD (gastroesophageal reflux disease)    H/O blood clots    upper rt groin and behind both knees   History of hiatal hernia    Hypertension    Lymphedema    right leg   Morphea scleroderma    PONV (postoperative nausea and vomiting)    states gets violently ill   Past Surgical History:  Procedure Laterality Date   BASAL CELL CARCINOMA EXCISION Left    CHOLECYSTECTOMY  2008   FLEXIBLE SIGMOIDOSCOPY N/A 05/19/2022   Procedure: FLEXIBLE SIGMOIDOSCOPY;  Surgeon: Toledo, Boykin Nearing, MD;  Location: [facility];  Service: Gastroenterology;  Laterality: N/A;   HERNIA REPAIR  2004   ILEOSTOMY CLOSURE N/A 08/24/2022   Procedure: ILEOSTOMY TAKEDOWN, open loop with Lynden Oxford, PA-C to assist;  Surgeon: Henrene Dodge, MD;  Location: [facility];  Service: General;  Laterality: N/A;   IVC FILTER INSERTION N/A 11/09/2023   Procedure: IVC FILTER INSERTION;  Surgeon: Annice Needy, MD;  Location: [facility];  Service: Cardiovascular;  Laterality: N/A;   PLANTAR FASCIECTOMY     ROBOTIC ASSISTED TOTAL HYSTERECTOMY WITH BILATERAL SALPINGO OOPHERECTOMY  02/14/2012   ROTATOR CUFF REPAIR Left 03/24/2022   TONSILLECTOMY     TOTAL  HIP ARTHROPLASTY Right 11/14/2023   Procedure: TOTAL HIP ARTHROPLASTY - posterior;  Surgeon: Reinaldo Berber, MD;  Location: [facility];  Service: Orthopedics;  Laterality: Right;   XI ROBOTIC ASSISTED LOWER ANTERIOR RESECTION N/A 05/24/2022   Procedure: XI ROBOTIC ASSISTED LOWER ANTERIOR RESECTION;  Surgeon: Henrene Dodge, MD;  Location: [facility];  Service: General;  Laterality: N/A;   Patient Active Problem List   Diagnosis Date Noted   S/P total right hip arthroplasty 11/14/2023   Blood coagulation disorder (HCC) 11/10/2023   PTSD (post-traumatic stress disorder) 11/10/2023   Severe episode of recurrent major depressive disorder, without psychotic features (HCC) 11/10/2023   GAD (generalized anxiety disorder) 11/10/2023   High risk medication use 11/10/2023   Anxiety and depression 03/22/2023   Cervical radiculopathy 03/16/2023   Colon cancer screening 03/15/2023   Chronic idiopathic constipation 03/15/2023   Dyspnea 03/07/2023   [place] spotted fever 03/07/2023   Strain of gastrocnemius tendon 03/07/2023   Lower abdominal pain 03/07/2023   Pain in pelvis 03/07/2023   Small bowel obstruction (HCC) 02/11/2023   Raynaud disease 02/11/2023   Polyarthralgia 02/11/2023   Edema 01/03/2023   Diverticular disease 12/10/2022   Hot flashes 12/10/2022   Pain of right lower extremity 12/10/2022   Paresthesia of lower extremity 12/10/2022   Primary insomnia 12/10/2022  Thyroid nodule 12/10/2022   Multiple joint pain 12/10/2022   Basal cell carcinoma of skin 12/10/2022   Acute bilateral low back pain without sciatica 11/10/2022   Chronic lower back pain 09/05/2022   S/P closure of ileostomy 08/24/2022   Ileostomy in place [facility])    Morphea 07/13/2022   Colonic stricture (HCC) 05/19/2022   Large bowel obstruction (HCC) 05/19/2022   Hypokalemia 05/18/2022   Abdominal pain 05/18/2022   Osteoarthritis of left knee 02/24/2022   Mixed connective tissue disease (HCC) 02/17/2022    Adhesive capsulitis of left shoulder 01/27/2022   Glenohumeral arthritis, left 01/27/2022   Microscopic hematuria 01/12/2022   Otorrhagia of right ear 12/10/2021   Elevated testosterone level in female 11/04/2021   Anti-TPO antibodies present 10/20/2021   Rash 10/13/2021   Hashimoto thyroiditis, 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   Hashimoto's disease 03/23/2021   Exposure to severe acute respiratory syndrome coronavirus 2 (SARS-CoV-2) 12/19/2020   Migraine without aura and without status migrainosus, not intractable 08/19/2020   Myofascial pain 07/30/2020   Neck pain 07/30/2020   Upper back pain 07/30/2020   Cervical facet joint syndrome 07/30/2020   Hair loss 07/14/2020   Edema of lower extremity 07/11/2020   Adenomatous colon polyp 02/26/2020   Gastroesophageal reflux disease 02/15/2020   Diarrhea 02/15/2020   Sleep disorder 01/16/2020   Hyperlipidemia 07/11/2019   Right shoulder pain 04/04/2019   Pre-operative clearance 12/13/2018   Endometrial cancer (HCC) 04/05/2018   Patellofemoral pain syndrome of right knee 03/20/2018   Gastroenteritis 12/07/2017   Fatigue 11/16/2017   Morbid obesity (HCC) 08/11/2017   Stucco keratoses 08/11/2017   Essential hypertension 08/09/2017   History of endometrial cancer 08/09/2017   Lymphedema 08/09/2017   Menopause 08/09/2017   Malignant neoplastic disease (HCC) 04/07/2017   Cellulitis 09/18/2016   Benign hypertension 06/02/2016   Asthma 06/04/1969    PCP: Pennie Banter, MD  REFERRING PROVIDER: Pennie Banter, MD  REFERRING DIAG: I89.0   THERAPY DIAG:  Lymphedema, not elsewhere classified  Rationale for Evaluation and Treatment: Rehabilitation  ONSET DATE: 2013  (Cancer-related, endometrial 2013)  SUBJECTIVE:                                                                                                                                                                                           SUBJECTIVE STATEMENT:Elizabeth Martin  presents to OT for lymphedema care to her RLE/RLQ. Pt has resumed OT after R total hip arthroplasty to limit limb swelling and lymphedema progression. She does not rate LE-related pain today, but expresses frustration re ongoing and frequent, daily nausea and vomiting. Pt reports she had to stop prescribed pain medication  due to these symptoms, so hip pain is persistent and limiting mobility, functional ambulation and ADLs. Pt's spouse continues to assist her with compression wrapping with short stretch bandages at home between visits.   PERTINENT HISTORY: Asthma, Autoimmune Disease, Endometrial Ca w/ LND ( 30 bilateral pelvic and periaortic LN), adjuvant chemotherapy and XRT,   H/O blood clots, RLE/RLQ lymphedema, Robotic assisted total hysterectomy w/ bilateral oophorectomy L Rotator Cuff repair, S/P ileostomy 2/2 colon stricture 05/2022 R hip arthroplasty scheduled 11/11/23.   PAIN:  Are you having pain? RLE lymphedema, Yes: NPRS scale: R HIP not rated /10 Pain location: RLE, including hip Pain description: aching, heavy, full, tight Aggravating factors: standing, walking, extended dependent sitting Relieving factors: elevation, movement  PRECAUTIONS: Fall and Other: LYMPHEDEMA  WEIGHT BEARING RESTRICTIONS: Yes 5# lifting restriction- shoulder  FALLS:  Has patient fallen in last 6 months? No  LIVING ENVIRONMENT: Lives with: lives with their spouse Lives in: House/apartment Stairs: No;  Has following equipment at home: Single point cane, Environmental consultant - 2 wheeled, Environmental consultant - 4 wheeled, and Wheelchair (manual)  OCCUPATION: retired Control and instrumentation engineer: gardening, hiking, biking, dancing- unable to participate in any of these due to pain and swelling  HAND DOMINANCE: right   PRIOR LEVEL OF FUNCTION: Independent with basic ADLs, Independent with household mobility without device, Independent with community mobility without device, Requires assistive device for independence, Needs assistance with  homemaking, Needs assistance with transfers, and Leisure: decreased  social participation for leisure pursuits due to impaired mobility and pain  PATIENT GOALS:  Be able to move freely without pain Reduce limb volume to be able to lift limb for basic daily ADLs and functional ambulation Reduce limb volume to increase ability to perform lower body dressing and bathing and grooming Reduce limb to increase body image  OBJECTIVE: Note: Objective measures were completed at Evaluation unless otherwise noted.  COGNITION:  Overall cognitive status: Within functional limits for tasks assessed   OBSERVATIONS / OTHER ASSESSMENTS:   POSTURE: WFL  LE ROM: WFL, but Limited mildly at R knee and ankle due to girth, skin approximation, and joint pain  LE MMT: WFL  Mild, Stage  II, Bilateral Lower Extremity Lymphedema 2/2 CVI and Obesity  Skin  Description Hyper-Keratosis Peau d' Orange Shiny Tight Fibrotic/ Indurated Fatty Doughy Spongy/ boggy   x x x x R>L   x   Skin dry Flaky WNL Macerated   mildly sclerotic     Color Redness Varicosities Blanching Hemosiderin Stain Mottled    x x   x   Odor Malodorous Yeast Fungal infection  WNL      x   Temperature Warm Cool wnl    x     Pitting Edema   1+ 2+ 3+ 4+ Non-pitting         x   Girth Symmetrical Asymmetrical                   Distribution    R>L RLE toes to groin    Stemmer Sign Positive Negative   +    Lymphorrhea History Of:  Present Absent     x    Wounds History Of Present Absent Venous Arterial Pressure Sheer     x        Signs of Infection Redness Warmth Erythema Acute Swelling Drainage Borders                    Sensation Light Touch Deep pressure Hypersensitivity  In tact Impaired In tact Impaired Absent Impaired   x  x  x     Nails WNL   Fungus nail dystrophy   x     Hair Growth Symmetrical Asymmetrical    R>L   Skin Creases Base of toes  Ankles   Base of Fingers knees       Abdominal  pannus Thigh Lobules  Face/neck   x x  x        BLE COMPARATIVE LIMB VOLUMETRICS 10/10/23  LANDMARK RIGHT  10/10/23  R LEG (A-D) 5466.5 ml  R THIGH (E-G) 9186.6 ml  R FULL LIMB (A-G) 14653.1 ml  Limb Volume differential (LVD)  LVD for LEG = 44.1%, R>L LVD for THIGH= 33.98%, R>L LVD for FULL lower extremity 37.8%, R>L  Volume change since last 08/11/22 R LEG is INCREASED in volume by 59.6%. L THIGH is INCREASED by 38 %, and RLE full limb is INCREASED by 45.38%.  Volume change overall V  (Blank rows = not tested)  LANDMARK LEFT  10/12/23  L LEG (A-D) 3053.6 ml  L THIGH (E-G) 6064.8 ml  L  FULL LIMB (A-G) 9118.4 ml  Limb Volume differential (LVD)  %  Volume change since initial %  Volume change overall %  (Blank rows = not tested)   CA-related LYMPHEDEMA Hx:  SURGERY TYPE/DATE: 2013 NUMBER OF LYMPH NODES REMOVED: 30 by report- bilateral pelvic and periaortic CHEMOTHERAPY: yes RADIATION:yes INFECTIONS: no hx cellulitis GAIT: Distance walked: >500 ft Assistive device utilized: single point cane Level of assistance: Modified independence- extra time Comments: R limp  LYMPHEDEMA LIFE IMPACT SCALE (LLIS): Intake 10/12/23 75% (The extent to which lymphedema related problems impacted your life over the past week)  FOTO (functional outcome measure):10/10/23 INTAKE: 36%  PATIENT EDUCATION:  Education details: Continued Pt/ CG edu for lymphedema self care home program throughout session. Topics include outcome of comparative limb volumetrics- starting limb volume differentials (LVDs), technology and gradient techniques used for short stretch, multilayer compression wrapping, simple self-MLD, therapeutic lymphatic pumping exercises, skin/nail care, LE precautions,. compression garment recommendations and specifications, wear and care schedule and compression garment donning / doffing w assistive devices. Discussed progress towards all OT goals since commencing CDT. All questions answered to  the Pt's satisfaction. Good return. Person educated: Patient Education method: Explanation, Demonstration, and Handouts Education comprehension: verbalized understanding, returned demonstration, and needs further education  LE SELF-CARE HOME  PROGRAM: Simple self-MLD/daily to affected quadrant and body part At least 2 x daily BLE Lymphatic Pumping There ex 1 set of 10 reps each, in order, bilaterally Daily skin care to affected body part to limit infection risk and increase skin excursion Compression Bandaging Intensive stage compression: multilayer short stretch wraps with gradient techniques. One limb at a time. Length patient dependent. Self-management Phase: Appropriate daytime compression garment and hours-of-sleep device  Compression garments: Custom-made gradient compression garments and hours-of-sleep devices are medically necessary because they are uniquely sized and shaped to fit the exact dimensions of the affected extremities and to provide accurate and consistent gradient compression and containment, essential  for optimally managing chronic, progressive lymphedema. The convoluted HOS devices are medically necessary to facilitate increased lymphatic circulation and limit fibrosis formation when sleeping. Multiple custom compression garments are needed for optimal hygiene to limit infection risk. Custom compression garments should be replaced q 3-6 months When worn consistently for optimal lipo-lymphedema self-management over time.  Pt will benefit from [id number]- Circaid reduction kit whole leg to increase independence with lymphedema self  care, to  reduce limb volume  for body symmetry and balance, and to limit infection risk and progression.  ASSESSMENT:  CLINICAL IMPRESSION: RLE/ RLQ remains moderately swollen with density reducing in thigh, but unchanged in leg and foot.  Swelling remains throughout the entire le and includes the buttock and lower R abdomen, with greatest  concentration below the knee.  Emphasis of visit on manual lymphatic drainage, including fibrosis techniques to leg and foot towards ipsilateral axillary lymph nodes, functional inguinal nodes,  and deep abdominal lymphatics.  Applied full limb  multilayer, gradient compression wraps  after manual therapy from base of toes to groin. Cont as per POC to assist with healing, to reduce limb swelling and progression of tissue hardening due to both lymphedema and auto immune issues, and to assist with normalizing functional ambulation and mobility. Priority is reducing swelling to enable Pt to resume wearing custom compression garments ASAP to reduce bulky wraps.   10/10/23 Lymphedema Episode 2, Initial OT Evaluation : Megham Dwyer is a 76 y a female presenting with chronic, progressive, moderate, Stage II, RLE/RLQ cancer -related lymphedema with onset 11 years ago after Rx for endometrial cancer. Pt is well known to this therapist as she has successfully undergone Complete Decongestive Therapy (CDT)  this clinic bin the past. Pt reports recent exacerbation of RLE swelling worsened as inflammation in L hip has worsened over time.  Pt has a hip replacement scheduled in late December here it [facility]. RE limb volumetrics today reveal that R LEG volume is dramatically increased by 59.65 since last visit. R LEG VOLUME is INCREASED in volume by 59.6%. L THIGH is INCREASED by 38 %, and RLE full limb is INCREASED by 45.38%. Pt presents with LE-related skin changes. Prolonged inflammation in the R hip joint has likely overloaded  already RLE/RLQ lymphatics.  RLE/RLQ lymphedema limits Pt's ability to perform basic and instrumental ADLs, including functional ambulation, mobility and transfers, grooming , lower body bathing and dressing, skin inspection and skin care. Pt has difficulty  reaching her lower legs and feet to apply compression wraps and don/doff        compression stockings. LE limits ability to P[perform  instrumental ADLs, including driving, yard work and home management activities. Lymphedema limits her ability to participate in leisure pursuits and productive activities, and it negatively impacts body image, life roles and quality of life.   Pt will benefit from an Intensive and follow along course of lymphedema. CDT will consist of Manual Lymphatic gainage   (MLD), skin care, therapeutic exercise and compression, wraps initially then garments. Pt will need assistance throughout CDT   for applying compression wraps due to limited hip AROM and decreased skin flexibility. Without skilled Occupational Therapy for lymphedema care, lymphedema will progress and further functional decline is expected.   In prep for upcoming hip replacement OT will educate Pt re assistive devices, including tub transfer bench and elevated toilet seat, in keeping with hip precautions.  OBJECTIVE IMPAIRMENTS: Abnormal gait, decreased activity tolerance, decreased balance, decreased knowledge of use of DME, decreased mobility, difficulty walking, decreased ROM, decreased strength, increased edema, decreased skin flexibility, increased fascial restrictions, impaired sensation, pain, and chronic, progressive LLE/LLQ lymphatic swelling and associated pain.   ADL LIMITATIONS: carrying, lifting, bending, sitting, standing, squatting, sleeping, stairs, transfers, bed mobility, bathing, dressing, hygiene/grooming, and productive activities, leisure pursuits, social participation, body image  driving, shopping, housework, yard work, cooking, meal prep  PERSONAL FACTORS: Past/current experiences and 3+ comorbidities: Morphea Scleroderma, Mixed connective tissue  disease, and osteoarthritis  are also affecting patient's functional outcome.   REHAB POTENTIAL: Good  EVALUATION COMPLEXITY: Moderate  GOALS: Goals reviewed with patient? Yes  SHORT TERM GOALS: Target date: 4th OT Rx visit   Pt will demonstrate understanding of lymphedema  precautions and prevention strategies with modified independence using a printed reference to identify at least 5 precautions and discussing how s/he may implement them into daily life to reduce risk of progression with extra time.  Baseline:Max A Goal status: GOAL MET  2.  Pt will be able to apply multilayer, knee length, gradient, compression wraps to one leg at a time with modified assistance (extra time and assistive device/s) to decrease limb volume, to limit infection risk, and to limit lymphedema progression.  Baseline: Dependent Goal status: GOAL MET  LONG TERM GOALS: Target date: 01/09/24 (12 weeks)  Given this patient's Intake score 36% on the functional outcomes FOTO tool, patient will experience an increase in function of 3 points to improve basic and instrumental ADLs performance, including lymphedema self-care.  Baseline: Max A Goal status: INITIAL  2.  Given this patient's Intake score of  75% on the Lymphedema Life Impact Scale (LLIS), patient will experience a reduction of at least 5 points in her perceived level of functional impairment resulting from lymphedema to improve functional performance and quality of life (QOL). Baseline: % Goal status: ONGOING  3.  Pt will achieve at least a 10% volume reduction in full, RLE limb volume to return limb to typical size and shape, to limit infection risk and LE progression, to decrease pain, to improve function. Baseline: Dependent Goal status:ONGOING  4.  Pt will obtain proper compression garments/devices and achieve modified independence (extra time + assistive devices) with donning/doffing to optimize limb volume reductions and limit LE  progression over time. Baseline:  Goal status: ONGOING  5.  During Intensive phase CDT , with modified independence, Pt will achieve at least 85% compliance with all lymphedema self-care home program components, including daily skin care, compression wraps and /or garments, simple self MLD and  lymphatic pumping therex to habituate LE self care protocol  into ADLs for optimal LE self-management over time. Baseline: Dependent Goal status: ONGOING PLAN:  OT FREQUENCY: 2x/week  OT DURATION: 12 weeks and PRN  PLANNED INTERVENTIONS: Complete Decongestive Therapy (Intensive and supported Self-Management Phases), 97110-Therapeutic exercises, 97530- Therapeutic activity, 97535- Self Care, [id number]- Manual therapy, Patient/Family education, Taping, Manual lymph drainage, Scar mobilization, Compression bandaging, DME instructions, and skin care to reduce infection risk throughout manual therapy. Fit with replacement compression garments ASAP  PLAN FOR NEXT SESSION:  Commence full limb multilayer compression wrapping using gradient bandaging techniques Pt and family edu for LE self-care home program- Bandaging   Loel Dubonnet, MS, OTR/L, CLT-LANA 01/02/24 9:07 AM  01/02/2024, 9:07 AM

## 2024-01-02 NOTE — Interval H&P Note (Signed)
History and Physical Interval Note:  01/02/2024 11:42 AM  Elizabeth Martin  has presented today for surgery, with the diagnosis of IVC Filter Removal   DVT.  The various methods of treatment have been discussed with the patient and family. After consideration of risks, benefits and other options for treatment, the patient has consented to  Procedure(s): IVC FILTER REMOVAL (N/A) as a surgical intervention.  The patient's history has been reviewed, patient examined, no change in status, stable for surgery.  I have reviewed the patient's chart and labs.  Questions were answered to the patient's satisfaction.     Festus Barren

## 2024-01-02 NOTE — Op Note (Signed)
[facility]   OPERATIVE NOTE    PRE-OPERATIVE DIAGNOSIS:  1. DVT 2. status post IVC filter placement  POST-OPERATIVE DIAGNOSIS: Same as above  PROCEDURE: 1.   Ultrasound guidance for vascular access right jugular vein 2.   Catheter placement into inferior vena cava from right jugular vein 3.   Inferior venacavogram 4.   Retrieval of Argon Option Elite IVC filter  SURGEON: Festus Barren, MD  ASSISTANT(S): None  ANESTHESIA: Local with moderate conscious sedation for approximately 10 minutes using 3 mg of Versed and 75 mcg of Fentanyl  ESTIMATED BLOOD LOSS: 5 cc  CONTRAST:  15 cc  FLUORO TIME:  2.3 minutes  FINDING(S): 1.  patent IVC  SPECIMEN(S):  IVC filter  INDICATIONS:    Patient is a 63 y.o. female who presents with a previous history of IVC filter placement. Patient has recovered from her orthopedic surgery and did not have a DVT in follow-up and no longer needs this filter. The patient remains on anticoagulation. Risks and benefits were discussed, and informed consent was obtained.  DESCRIPTION: After obtaining full informed written consent, the patient was brought back to the vascular suite and placed supine upon the table. Moderate conscious sedation was administered during a face to face encounter with the patient throughout the procedure with my supervision of the RN administering medicines and monitoring the patient's vital signs, pulse oximetry, telemetry and mental status throughout from the start of the procedure until the patient was taken to the recovery room.  After obtaining adequate anesthesia, the patient was prepped and draped in the standard fashion.  The right jugular vein was visualized with ultrasound and found to be widely patent. It was then accessed under direct ultrasound guidance without difficulty with the Seldinger needle and a permanent image was recorded. A J-wire was placed. After skin nick and dilatation, the retrieval  sheath was placed over the wire and advanced into the inferior vena cava. Inferior vena cava was imaged and found to be widely patent on inferior venacavogram. The filter was straight in its orientation. The retrieval snare was then placed through the sheath and the hook of the filter was snared without difficulty. The sheath was then advanced, and the filter was collapsed and brought into the sheath in its entirety. It was then removed from the body in its entirety. The retrieval sheath was then removed. Pressure was held at the access site and sterile dressing was placed. The patient was taken to the recovery room in stable condition having tolerated the procedure well.  COMPLICATIONS: None  CONDITION:  Stable   Festus Barren 01/02/2024 1:03 PM  This note was created with Dragon Medical transcription system. Any errors in dictation are purely unintentional.

## 2024-01-03 ENCOUNTER — Ambulatory Visit (INDEPENDENT_AMBULATORY_CARE_PROVIDER_SITE_OTHER): Admitting: Licensed Clinical Social Worker

## 2024-01-03 ENCOUNTER — Telehealth (INDEPENDENT_AMBULATORY_CARE_PROVIDER_SITE_OTHER): Payer: Self-pay | Admitting: Vascular Surgery

## 2024-01-03 ENCOUNTER — Encounter: Payer: Self-pay | Admitting: Vascular Surgery

## 2024-01-03 DIAGNOSIS — F411 Generalized anxiety disorder: Secondary | ICD-10-CM

## 2024-01-03 DIAGNOSIS — F332 Major depressive disorder, recurrent severe without psychotic features: Secondary | ICD-10-CM

## 2024-01-03 DIAGNOSIS — F4310 Post-traumatic stress disorder, unspecified: Secondary | ICD-10-CM

## 2024-01-03 NOTE — Telephone Encounter (Signed)
LVM for pt TCB and schedule appt  Follow-Ups: Follow up with Georgiana Spinner, NP (Vascular Surgery) in 4 weeks (01/30/2024);
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
## 2024-01-04 ENCOUNTER — Encounter: Payer: Self-pay | Admitting: Occupational Therapy

## 2024-01-04 ENCOUNTER — Ambulatory Visit: Admitting: Occupational Therapy

## 2024-01-04 DIAGNOSIS — I890 Lymphedema, not elsewhere classified: Secondary | ICD-10-CM

## 2024-01-04 NOTE — Therapy (Signed)
OUTPATIENT PHYSICAL THERAPY LOWER EXTREMITY EVALUATION   Patient Name: Elizabeth Martin MRN: [id number] DOB:29-May-1961, 63 y.o., female Today's Date: 01/05/2024  END OF SESSION:  PT End of Session - 01/05/24 1003     Visit Number 1    Number of Visits 16    Date for PT Re-Evaluation 03/01/24    PT Start Time 0800    PT Stop Time 0844    PT Time Calculation (min) 44 min    Equipment Utilized During Treatment Gait belt    Activity Tolerance Patient tolerated treatment well    Behavior During Therapy WFL for tasks assessed/performed             Past Medical History:  Diagnosis Date   Anxiety    Asthma    Autoimmune disease (HCC)    Cancer (HCC)    Endometrial lymph node removal (30)   Corneal abrasion, right 05/24/2022   Depression    GERD (gastroesophageal reflux disease)    H/O blood clots    upper rt groin and behind both knees   History of hiatal hernia    Hypertension    Lymphedema    right leg   Morphea scleroderma    PONV (postoperative nausea and vomiting)    states gets violently ill   Past Surgical History:  Procedure Laterality Date   BASAL CELL CARCINOMA EXCISION Left    CHOLECYSTECTOMY  2008   FLEXIBLE SIGMOIDOSCOPY N/A 05/19/2022   Procedure: FLEXIBLE SIGMOIDOSCOPY;  Surgeon: Toledo, Boykin Nearing, MD;  Location: [facility] END[facility];  Service: Gastroenterology;  Laterality: N/A;   HERNIA REPAIR  2004   ILEOSTOMY CLOSURE N/A 08/24/2022   Procedure: ILEOSTOMY TAKEDOWN, open loop with Lynden Oxford, PA-C to assist;  Surgeon: Henrene Dodge, MD;  Location: [facility] ORS;  Service: General;  Laterality: N/A;   IVC FILTER INSERTION N/A 11/09/2023   Procedure: IVC FILTER INSERTION;  Surgeon: Annice Needy, MD;  Location: [facility] IN[facility] CV LAB;  Service: Cardiovascular;  Laterality: N/A;   IVC FILTER REMOVAL N/A 01/02/2024   Procedure: IVC FILTER REMOVAL;  Surgeon: Annice Needy, MD;  Location: [facility] CV [facility];  Service: Cardiovascular;  Laterality: N/A;   PLANTAR  FASCIECTOMY     ROBOTIC ASSISTED TOTAL HYSTERECTOMY WITH BILATERAL SALPINGO OOPHERECTOMY  02/14/2012   ROTATOR CUFF REPAIR Left 03/24/2022   TONSILLECTOMY     TOTAL HIP ARTHROPLASTY Right 11/14/2023   Procedure: TOTAL HIP ARTHROPLASTY - posterior;  Surgeon: Reinaldo Berber, MD;  Location: [facility] ORS;  Service: Orthopedics;  Laterality: Right;   XI ROBOTIC ASSISTED LOWER ANTERIOR RESECTION N/A 05/24/2022   Procedure: XI ROBOTIC ASSISTED LOWER ANTERIOR RESECTION;  Surgeon: Henrene Dodge, MD;  Location: [facility] ORS;  Service: General;  Laterality: N/A;   Patient Active Problem List   Diagnosis Date Noted   S/P total right hip arthroplasty 11/14/2023   Blood coagulation disorder (HCC) 11/10/2023   PTSD (post-traumatic stress disorder) 11/10/2023   Severe episode of recurrent major depressive disorder, without psychotic features (HCC) 11/10/2023   GAD (generalized anxiety disorder) 11/10/2023   High risk medication use 11/10/2023   Anxiety and depression 03/22/2023   Cervical radiculopathy 03/16/2023   Colon cancer screening 03/15/2023   Chronic idiopathic constipation 03/15/2023   Dyspnea 03/07/2023   [place] spotted fever 03/07/2023   Strain of gastrocnemius tendon 03/07/2023   Lower abdominal pain 03/07/2023   Pain in pelvis 03/07/2023   Small bowel obstruction (HCC) 02/11/2023   Raynaud disease 02/11/2023   Polyarthralgia 02/11/2023   Edema 01/03/2023  Diverticular disease 12/10/2022   Hot flashes 12/10/2022   Pain of right lower extremity 12/10/2022   Paresthesia of lower extremity 12/10/2022   Primary insomnia 12/10/2022   Thyroid nodule 12/10/2022   Multiple joint pain 12/10/2022   Basal cell carcinoma of skin 12/10/2022   Acute bilateral low back pain without sciatica 11/10/2022   Chronic lower back pain 09/05/2022   S/P closure of ileostomy 08/24/2022   Ileostomy in place [facility])    Morphea 07/13/2022   Colonic stricture (HCC) 05/19/2022   Large bowel obstruction (HCC)  05/19/2022   Hypokalemia 05/18/2022   Abdominal pain 05/18/2022   Osteoarthritis of left knee 02/24/2022   Mixed connective tissue disease (HCC) 02/17/2022   Adhesive capsulitis of left shoulder 01/27/2022   Glenohumeral arthritis, left 01/27/2022   Microscopic hematuria 01/12/2022   Otorrhagia of right ear 12/10/2021   Elevated testosterone level in female 11/04/2021   Anti-TPO antibodies present 10/20/2021   Rash 10/13/2021   Hashimoto thyroiditis, 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   Hashimoto's disease 03/23/2021   Exposure to severe acute respiratory syndrome coronavirus 2 (SARS-CoV-2) 12/19/2020   Migraine without aura and without status migrainosus, not intractable 08/19/2020   Myofascial pain 07/30/2020   Neck pain 07/30/2020   Upper back pain 07/30/2020   Cervical facet joint syndrome 07/30/2020   Hair loss 07/14/2020   Edema of lower extremity 07/11/2020   Adenomatous colon polyp 02/26/2020   Gastroesophageal reflux disease 02/15/2020   Diarrhea 02/15/2020   Sleep disorder 01/16/2020   Hyperlipidemia 07/11/2019   Right shoulder pain 04/04/2019   Pre-operative clearance 12/13/2018   Endometrial cancer (HCC) 04/05/2018   Patellofemoral pain syndrome of right knee 03/20/2018   Gastroenteritis 12/07/2017   Fatigue 11/16/2017   Morbid obesity (HCC) 08/11/2017   Stucco keratoses 08/11/2017   Essential hypertension 08/09/2017   History of endometrial cancer 08/09/2017   Lymphedema 08/09/2017   Menopause 08/09/2017   Malignant neoplastic disease (HCC) 04/07/2017   Cellulitis 09/18/2016   Benign hypertension 06/02/2016   Asthma 06/04/1969    PCP: Pennie Banter MD   REFERRING PROVIDER: Pennie Banter MD   REFERRING DIAG: s/p R hip replacement  THERAPY DIAG:  Pain in right hip  Stiffness of right hip, not elsewhere classified  Difficulty in walking, not elsewhere classified  Rationale for Evaluation and Treatment: Rehabilitation  ONSET DATE: 11/14/23  SUBJECTIVE:    SUBJECTIVE STATEMENT: Patient presents to PT s/p R hip arthroplasty 11/14/23.   PERTINENT HISTORY: Pmh includes s/p R hip arthropaslasty 11/14/23, blood coagulation disorder, PTSD, anxiety, cervical radiculopathy dypsnea, polyarthralgia, Raynaud disease, s/p closure of ileostomy, Hashimoto thyroid, endometrial cancer, menopause, endometrial cancer, lymphedema. Patient had home health PT up until last week. Reports it is getting better.  PAIN:  Are you having pain? Yes: NPRS scale: 3 Pain location: back of hip Pain description: aching Aggravating factors: walk too much  Relieving factors: resting, laying on L side   Worst pain: 5/10 Least pain: 3/10  PRECAUTIONS: Other: recovering from hip sx  RED FLAGS: None   WEIGHT BEARING RESTRICTIONS: No  FALLS:  Has patient fallen in last 6 months? Yes. Number of falls 2  LIVING ENVIRONMENT: Lives with: lives with their spouse Lives in: House/apartment Stairs: No Has following equipment at home: Single point cane  OCCUPATION: retired,   PLOF: Independent  PATIENT GOALS: return to hiking, working out, gardening.   NEXT MD VISIT: saw surgeon last week.   OBJECTIVE:  Note: Objective measures were completed at Evaluation unless otherwise noted.  DIAGNOSTIC  FINDINGS:   IMPRESSION: Right hip replacement.  No visible complicating feature.  PATIENT SURVEYS:  LEFS 5/80  COGNITION: Overall cognitive status: Within functional limits for tasks assessed     SENSATION: WFL  EDEMA:  Currently being treated for lymphedema   MUSCLE LENGTH: Hamstrings: limited bilaterally with R>L   POSTURE: weight shift left  PALPATION: Edema/lymphedema affecting RLE  LOWER EXTREMITY ROM:  Passive ROM Right eval Left eval  Hip flexion 48 flexion PROM supine   Hip extension    Hip abduction 5 degrees AROM    Hip adduction    Hip internal rotation    Hip external rotation    Knee flexion    Knee extension    Ankle dorsiflexion     Ankle plantarflexion    Ankle inversion    Ankle eversion     (Blank rows = not tested)  LOWER EXTREMITY MMT:  MMT Right eval Left eval  Hip flexion 2+ 5  Hip extension  5  Hip abduction 2+* painful 5  Hip adduction 2+ 5  Hip internal rotation    Hip external rotation    Knee flexion 3 5  Knee extension 3 5  Ankle dorsiflexion 3 5  Ankle plantarflexion 3 5  Ankle inversion    Ankle eversion     (Blank rows = not tested)  LOWER EXTREMITY SPECIAL TESTS:  N/a post surgical  FUNCTIONAL TESTS:  5 times sit to stand: 29 6 minute walk test: perform next session.  10 meter walk test: 15 seconds with SPC   GAIT: Distance walked: 60 ft Assistive device utilized: Single point cane Level of assistance: CGA Comments: antalgic gait pattern with decreased stance time RLE. Slight vaulting pattern.  Stair negotiation: -step to pattern lead with LLE, one hand on cane one hand on rail ascending. Descending step to pattern down with RLE.                                                                                                                               TREATMENT DATE: 01/05/24     PATIENT EDUCATION:  Education details: goals, POC, HEP Person educated: Patient Education method: Explanation, Demonstration, Tactile cues, and Verbal cues Education comprehension: verbalized understanding, returned demonstration, verbal cues required, tactile cues required, and needs further education  HOME EXERCISE PROGRAM: Access Code: [facility] URL: [email or web address] Date: 01/05/2024 Prepared by: Precious Bard  Exercises - Side Stepping with Counter Support  - 1 x daily - 7 x weekly - 2 sets - 10 reps - 5 hold - Standing Hip Extension  - 1 x daily - 7 x weekly - 2 sets - 10 reps - 5 hold - Standing March with Counter Support  - 1 x daily - 7 x weekly - 2 sets - 10 reps - 5 hold - Seated Hamstring Stretch  - 1 x daily - 7 x weekly - 2 sets - 2 reps - 30 hold - Sit to  Stand  - 1 x daily - 7 x weekly - 2 sets - 10 reps - 5 hold  ASSESSMENT:  CLINICAL IMPRESSION: Patient is a 63 y.o. female who was seen today for physical therapy evaluation and treatment for R hip replacement. Patient has limited ROM and strength of post surgical limb indicating need for therapy. She is highly motivated for progression of mobility due to upcoming trip. Lymphedema of RLE results in increased stress on post surgical joint. Will perform a six minute walk test next session as able. Patient will benefit from skilled physical therapy to reduce pain, improve ROM, and return to PLOF.   OBJECTIVE IMPAIRMENTS: Abnormal gait, decreased activity tolerance, decreased balance, decreased coordination, decreased endurance, decreased mobility, difficulty walking, decreased ROM, decreased strength, hypomobility, increased edema, impaired perceived functional ability, impaired flexibility, improper body mechanics, postural dysfunction, and pain.   ACTIVITY LIMITATIONS: carrying, lifting, bending, sitting, standing, squatting, sleeping, stairs, transfers, bed mobility, bathing, toileting, dressing, reach over head, hygiene/grooming, locomotion level, and caring for others  PARTICIPATION LIMITATIONS: meal prep, cleaning, laundry, interpersonal relationship, driving, shopping, community activity, and yard work  PERSONAL FACTORS: Age, Past/current experiences, Time since onset of injury/illness/exacerbation, and 3+ comorbidities: blood coagulation disorder, PTSD, anxiety, cervical radiculopathy dypsnea, polyarthralgia, Raynaud disease, s/p closure of ileostomy, Hashimoto thyroid, endometrial cancer, menopause, endometrial cancer, lymphedema  are also affecting patient's functional outcome.   REHAB POTENTIAL: Good  CLINICAL DECISION MAKING: Evolving/moderate complexity  EVALUATION COMPLEXITY: Moderate   GOALS: Goals reviewed with patient? Yes  SHORT TERM GOALS: Target date: 01/19/2024   Patient  will be independent in home exercise program to improve strength/mobility for better functional independence with ADLs. Baseline:1/30: HEP given Goal status: INITIAL    LONG TERM GOALS: Target date: 03/01/2024   Patient (> 4 years old) will complete five times sit to stand test in < 10 seconds indicating an increased LE strength and improved balance. Baseline: 1/30: 29 seconds Goal status: INITIAL  2.  Patient will increase six minute walk test distance to >1000 for progression to community ambulator and improve gait ability Baseline: perform next session Goal status: INITIAL  3.  Patient will increase 10 meter walk test to >1.24m/s as to improve gait speed for better community ambulation and to reduce fall risk. Baseline: 1/30: 15 seconds with SPC  Goal status: INITIAL  4.  Patient will increase lower extremity functional scale to >60/80 to demonstrate improved functional mobility and increased tolerance with ADLs.  Baseline: 1/30: 5/80 Goal status: INITIAL     PLAN:  PT FREQUENCY: 2x/week  PT DURATION: 8 weeks  PLANNED INTERVENTIONS: 97164- PT Re-evaluation, 97110-Therapeutic exercises, 97530- Therapeutic activity, 97112- Neuromuscular re-education, 97535- Self Care, 10272- Manual therapy, [id number]- Gait training, [phone]- Splinting, 97014- Electrical stimulation (unattended), [phone]- Electrical stimulation (manual), [id number]- Ultrasound, 42595- Traction (mechanical), Patient/Family education, Balance training, Stair training, Taping, Joint mobilization, Joint manipulation, Spinal manipulation, Spinal mobilization, Manual lymph drainage, Scar mobilization, Compression bandaging, Vestibular training, Visual/preceptual remediation/compensation, DME instructions, Cryotherapy, and Moist heat  PLAN FOR NEXT SESSION: 6 min walk test, R hip strength and ROM    Precious Bard, PT 01/05/2024, 10:13 AM

## 2024-01-04 NOTE — Therapy (Signed)
OUTPATIENT OCCUPATIONAL THERAPY TREATMENT NOTE AND PROGRESS REPORT  LOWER EXTREMITY LYMPHEDEMA  Patient Name: Elizabeth Martin MRN: [id number] DOB:11-16-1961, 63 y.o., female Today's Date: 01/04/2024  REPORTING PERIOD:  10/10/23 - 01/04/24  END OF SESSION:  Lymphedema Episode 2   OT End of Session - 01/04/24 0814     Visit Number 10    Number of Visits 36    Date for OT Re-Evaluation 01/09/24    OT Start Time 0806    OT Stop Time 0906    OT Time Calculation (min) 60 min    Activity Tolerance Patient tolerated treatment well;No increased pain    Behavior During Therapy WFL for tasks assessed/performed              Past Medical History:  Diagnosis Date   Anxiety    Asthma    Autoimmune disease (HCC)    Cancer (HCC)    Endometrial lymph node removal (30)   Corneal abrasion, right 05/24/2022   Depression    GERD (gastroesophageal reflux disease)    H/O blood clots    upper rt groin and behind both knees   History of hiatal hernia    Hypertension    Lymphedema    right leg   Morphea scleroderma    PONV (postoperative nausea and vomiting)    states gets violently ill   Past Surgical History:  Procedure Laterality Date   BASAL CELL CARCINOMA EXCISION Left    CHOLECYSTECTOMY  2008   FLEXIBLE SIGMOIDOSCOPY N/A 05/19/2022   Procedure: FLEXIBLE SIGMOIDOSCOPY;  Surgeon: Toledo, Boykin Nearing, MD;  Location: [facility] ENDOS[facility];  Service: Gastroenterology;  Laterality: N/A;   HERNIA REPAIR  2004   ILEOSTOMY CLOSURE N/A 08/24/2022   Procedure: ILEOSTOMY TAKEDOWN, open loop with Lynden Oxford, PA-C to assist;  Surgeon: Henrene Dodge, MD;  Location: [facility] ORS;  Service: General;  Laterality: N/A;   IVC FILTER INSERTION N/A 11/09/2023   Procedure: IVC FILTER INSERTION;  Surgeon: Annice Needy, MD;  Location: [facility] IN[facility]IVE [facility];  Service: Cardiovascular;  Laterality: N/A;   IVC FILTER REMOVAL N/A 01/02/2024   Procedure: IVC FILTER REMOVAL;  Surgeon: Annice Needy, MD;  Location: [facility]  INVASIVE CV LAB;  Service: Cardiovascular;  Laterality: N/A;   PLANTAR FASCIECTOMY     ROBOTIC ASSISTED TOTAL HYSTERECTOMY WITH BILATERAL SALPINGO OOPHERECTOMY  02/14/2012   ROTATOR CUFF REPAIR Left 03/24/2022   TONSILLECTOMY     TOTAL HIP ARTHROPLASTY Right 11/14/2023   Procedure: TOTAL HIP ARTHROPLASTY - posterior;  Surgeon: Reinaldo Berber, MD;  Location: [facility] ORS;  Service: Orthopedics;  Laterality: Right;   XI ROBOTIC ASSISTED LOWER ANTERIOR RESECTION N/A 05/24/2022   Procedure: XI ROBOTIC ASSISTED LOWER ANTERIOR RESECTION;  Surgeon: Henrene Dodge, MD;  Location: [facility] ORS;  Service: General;  Laterality: N/A;   Patient Active Problem List   Diagnosis Date Noted   S/P total right hip arthroplasty 11/14/2023   Blood coagulation disorder (HCC) 11/10/2023   PTSD (post-traumatic stress disorder) 11/10/2023   Severe episode of recurrent major depressive disorder, without psychotic features (HCC) 11/10/2023   GAD (generalized anxiety disorder) 11/10/2023   High risk medication use 11/10/2023   Anxiety and depression 03/22/2023   Cervical radiculopathy 03/16/2023   Colon cancer screening 03/15/2023   Chronic idiopathic constipation 03/15/2023   Dyspnea 03/07/2023   [facility] spotted fever 03/07/2023   Strain of gastrocnemius tendon 03/07/2023   Lower abdominal pain 03/07/2023   Pain in pelvis 03/07/2023   Small bowel obstruction (HCC) 02/11/2023  Raynaud disease 02/11/2023   Polyarthralgia 02/11/2023   Edema 01/03/2023   Diverticular disease 12/10/2022   Hot flashes 12/10/2022   Pain of right lower extremity 12/10/2022   Paresthesia of lower extremity 12/10/2022   Primary insomnia 12/10/2022   Thyroid nodule 12/10/2022   Multiple joint pain 12/10/2022   Basal cell carcinoma of skin 12/10/2022   Acute bilateral low back pain without sciatica 11/10/2022   Chronic lower back pain 09/05/2022   S/P closure of ileostomy 08/24/2022   Ileostomy in place [facility])    Morphea  07/13/2022   Colonic stricture (HCC) 05/19/2022   Large bowel obstruction (HCC) 05/19/2022   Hypokalemia 05/18/2022   Abdominal pain 05/18/2022   Osteoarthritis of left knee 02/24/2022   Mixed connective tissue disease (HCC) 02/17/2022   Adhesive capsulitis of left shoulder 01/27/2022   Glenohumeral arthritis, left 01/27/2022   Microscopic hematuria 01/12/2022   Otorrhagia of right ear 12/10/2021   Elevated testosterone level in female 11/04/2021   Anti-TPO antibodies present 10/20/2021   Rash 10/13/2021   Hashimoto thyroiditis, 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   Hashimoto's disease 03/23/2021   Exposure to severe acute respiratory syndrome coronavirus 2 (SARS-CoV-2) 12/19/2020   Migraine without aura and without status migrainosus, not intractable 08/19/2020   Myofascial pain 07/30/2020   Neck pain 07/30/2020   Upper back pain 07/30/2020   Cervical facet joint syndrome 07/30/2020   Hair loss 07/14/2020   Edema of lower extremity 07/11/2020   Adenomatous colon polyp 02/26/2020   Gastroesophageal reflux disease 02/15/2020   Diarrhea 02/15/2020   Sleep disorder 01/16/2020   Hyperlipidemia 07/11/2019   Right shoulder pain 04/04/2019   Pre-operative clearance 12/13/2018   Endometrial cancer (HCC) 04/05/2018   Patellofemoral pain syndrome of right knee 03/20/2018   Gastroenteritis 12/07/2017   Fatigue 11/16/2017   Morbid obesity (HCC) 08/11/2017   Stucco keratoses 08/11/2017   Essential hypertension 08/09/2017   History of endometrial cancer 08/09/2017   Lymphedema 08/09/2017   Menopause 08/09/2017   Malignant neoplastic disease (HCC) 04/07/2017   Cellulitis 09/18/2016   Benign hypertension 06/02/2016   Asthma 06/04/1969    PCP: Pennie Banter, MD  REFERRING PROVIDER: Pennie Banter, MD  REFERRING DIAG: I89.0   THERAPY DIAG:  Lymphedema, not elsewhere classified  Rationale for Evaluation and Treatment: Rehabilitation  ONSET DATE: 2013  (Cancer-related, endometrial  2013)  SUBJECTIVE:                                                                                                                                                                                           SUBJECTIVE STATEMENT:Stephannie Splinter presents to OT for lymphedema care to her RLE/RLQ.  Pt has resumed OT after R total hip arthroplasty to limit limb swelling and lymphedema progression ~ 6-7 WEEKS AGO. She RATES LE-related RLE  pain at 3/10. She states she is feeling much better today compared with last OT visit.  PERTINENT HISTORY: Asthma, Autoimmune Disease, Endometrial Ca w/ LND ( 30 bilateral pelvic and periaortic LN), adjuvant chemotherapy and XRT,   H/O blood clots, RLE/RLQ lymphedema, Robotic assisted total hysterectomy w/ bilateral oophorectomy L Rotator Cuff repair, S/P ileostomy 2/2 colon stricture 05/2022 R hip arthroplasty scheduled 11/11/23.   PAIN:  Are you having pain? RLE lymphedema, Yes: NPRS scale: R HIP not rated 3/10 Pain location: RLE, including hip Pain description: aching, heavy, full, tight Aggravating factors: standing, walking, extended dependent sitting Relieving factors: elevation, movement  PRECAUTIONS: Fall and Other: LYMPHEDEMA  WEIGHT BEARING RESTRICTIONS: Yes 5# lifting restriction- shoulder  FALLS:  Has patient fallen in last 6 months? No  LIVING ENVIRONMENT: Lives with: lives with their spouse Lives in: House/apartment Stairs: No;  Has following equipment at home: Single point cane, Environmental consultant - 2 wheeled, Environmental consultant - 4 wheeled, and Wheelchair (manual)  OCCUPATION: retired Control and instrumentation engineer: gardening, hiking, biking, dancing- unable to participate in any of these due to pain and swelling  HAND DOMINANCE: right   PRIOR LEVEL OF FUNCTION: Independent with basic ADLs, Independent with household mobility without device, Independent with community mobility without device, Requires assistive device for independence, Needs assistance with homemaking, Needs  assistance with transfers, and Leisure: decreased  social participation for leisure pursuits due to impaired mobility and pain  PATIENT GOALS:  Be able to move freely without pain Reduce limb volume to be able to lift limb for basic daily ADLs and functional ambulation Reduce limb volume to increase ability to perform lower body dressing and bathing and grooming Reduce limb to increase body image  OBJECTIVE: Note: Objective measures were completed at Evaluation unless otherwise noted.  COGNITION:  Overall cognitive status: Within functional limits for tasks assessed   OBSERVATIONS / OTHER ASSESSMENTS:   POSTURE: WFL  LE ROM: WFL, but Limited mildly at R knee and ankle due to girth, skin approximation, and joint pain  LE MMT: WFL  Mild, Stage  II, Bilateral Lower Extremity Lymphedema 2/2 CVI and Obesity  Skin  Description Hyper-Keratosis Peau d' Orange Shiny Tight Fibrotic/ Indurated Fatty Doughy Spongy/ boggy   x x x x R>L   x   Skin dry Flaky WNL Macerated   mildly sclerotic     Color Redness Varicosities Blanching Hemosiderin Stain Mottled    x x   x   Odor Malodorous Yeast Fungal infection  WNL      x   Temperature Warm Cool wnl    x     Pitting Edema   1+ 2+ 3+ 4+ Non-pitting         x   Girth Symmetrical Asymmetrical                   Distribution    R>L RLE toes to groin    Stemmer Sign Positive Negative   +    Lymphorrhea History Of:  Present Absent     x    Wounds History Of Present Absent Venous Arterial Pressure Sheer     x        Signs of Infection Redness Warmth Erythema Acute Swelling Drainage Borders                    Sensation  Light Touch Deep pressure Hypersensitivity   In tact Impaired In tact Impaired Absent Impaired   x  x  x     Nails WNL   Fungus nail dystrophy   x     Hair Growth Symmetrical Asymmetrical    R>L   Skin Creases Base of toes  Ankles   Base of Fingers knees       Abdominal pannus Thigh Lobules   Face/neck   x x  x        BLE COMPARATIVE LIMB VOLUMETRICS 10/10/23  LANDMARK RIGHT  10/10/23  R LEG (A-D) 5466.5 ml  R THIGH (E-G) 9186.6 ml  R FULL LIMB (A-G) 14653.1 ml  Limb Volume differential (LVD)  LVD for LEG = 44.1%, R>L LVD for THIGH= 33.98%, R>L LVD for FULL lower extremity 37.8%, R>L  Volume change since last 08/11/22 R LEG is INCREASED in volume by 59.6%. L THIGH is INCREASED by 38 %, and RLE full limb is INCREASED by 45.38%.  Volume change overall V  (Blank rows = not tested)  LANDMARK LEFT  10/12/23  L LEG (A-D) 3053.6 ml  L THIGH (E-G) 6064.8 ml  L  FULL LIMB (A-G) 9118.4 ml  Limb Volume differential (LVD)  %  Volume change since initial %  Volume change overall %  (Blank rows = not tested)   10 th VISIT PROGRESS NOTE RLE COMPARATIVE LIMB VOLUMETRICS 01/04/24: Deferred until next visit by Pt request.  TBA next session  LANDMARK RIGHT    R LEG (A-D) TBA  R THIGH (E-G) TBA  R FULL LIMB (A-G) TBA  Limb Volume differential (LVD)    Volume change since last 08/11/22 TBA  Volume change overall TBA  (Blank rows = not tested)    CA-related LYMPHEDEMA Hx:  SURGERY TYPE/DATE: 2013 NUMBER OF LYMPH NODES REMOVED: 30 by report- bilateral pelvic and periaortic CHEMOTHERAPY: yes RADIATION:yes INFECTIONS: no hx cellulitis GAIT: Distance walked: >500 ft Assistive device utilized: single point cane Level of assistance: Modified independence- extra time Comments: R limp  LYMPHEDEMA LIFE IMPACT SCALE (LLIS): Intake 10/12/23 75% (The extent to which lymphedema related problems impacted your life over the past week)  FOTO (functional outcome measure):10/10/23 INTAKE: 36%  PATIENT EDUCATION:  Education details: Continued Pt/ CG edu for lymphedema self care home program throughout session. Topics include outcome of comparative limb volumetrics- starting limb volume differentials (LVDs), technology and gradient techniques used for short stretch, multilayer compression wrapping,  simple self-MLD, therapeutic lymphatic pumping exercises, skin/nail care, LE precautions,. compression garment recommendations and specifications, wear and care schedule and compression garment donning / doffing w assistive devices. Discussed progress towards all OT goals since commencing CDT. All questions answered to the Pt's satisfaction. Good return. Person educated: Patient Education method: Explanation, Demonstration, and Handouts Education comprehension: verbalized understanding, returned demonstration, and needs further education  LE SELF-CARE HOME  PROGRAM: Simple self-MLD/daily to affected quadrant and body part At least 2 x daily BLE Lymphatic Pumping There ex 1 set of 10 reps each, in order, bilaterally Daily skin care to affected body part to limit infection risk and increase skin excursion Compression Bandaging Intensive stage compression: multilayer short stretch wraps with gradient techniques. One limb at a time. Length patient dependent. Self-management Phase: Appropriate daytime compression garment and hours-of-sleep device  Compression garments: Custom-made gradient compression garments and hours-of-sleep devices are medically necessary because they are uniquely sized and shaped to fit the exact dimensions of the affected extremities and to provide accurate and consistent gradient  compression and containment, essential  for optimally managing chronic, progressive lymphedema. The convoluted HOS devices are medically necessary to facilitate increased lymphatic circulation and limit fibrosis formation when sleeping. Multiple custom compression garments are needed for optimal hygiene to limit infection risk. Custom compression garments should be replaced q 3-6 months When worn consistently for optimal lipo-lymphedema self-management over time.  Pt will benefit from [id number]- Circaid reduction kit whole leg to increase independence with lymphedema self care, to  reduce limb volume  for  body symmetry and balance, and to limit infection risk and progression.  ASSESSMENT:  CLINICAL IMPRESSION: Progress report: Pt demonstrates slow, but steady progress towards all OT goals for lymphedema management. Please review GOALS sections for detailed notes. She is diligent with compression wraps between sessions and spouse is supportive with assistance.  Volumetrics were deferred today by Pt request to increase time for MLD, which provides pain relief. Volumetrics next visit. By visual assessment I would estimate ~10% volume reduction for entire RLE to DATE. Tissue density in ankle, foot, leg and thigh is slightly reduced, which is encouraging as this will enable lymphatics to function with less tissue interference and will reduce infection risk and pain. Swelling remains throughout the entire le and includes the buttock and lower R abdomen, with greatest concentration below the knee.  After reviewing goals and progress, emphasis of visit on manual lymphatic drainage, including fibrosis techniques to leg and foot towards ipsilateral axillary lymph nodes, functional inguinal nodes,  and deep abdominal lymphatics.  Applied full limb  multilayer, gradient compression wraps  after manual therapy from base of toes to groin. Cont as per POC to assist with healing, to reduce limb swelling and progression of tissue hardening due to both lymphedema and auto immune issues, and to assist with normalizing functional ambulation and mobility. Priority is reducing swelling to enable Pt to resume wearing custom compression garments ASAP to reduce bulky wraps.   10/10/23 Lymphedema Episode 2, Initial OT Evaluation : Sangeeta Youse is a 28 y a female presenting with chronic, progressive, moderate, Stage II, RLE/RLQ cancer -related lymphedema with onset 11 years ago after Rx for endometrial cancer. Pt is well known to this therapist as she has successfully undergone Complete Decongestive Therapy (CDT)  this clinic bin the  past. Pt reports recent exacerbation of RLE swelling worsened as inflammation in L hip has worsened over time.  Pt has a hip replacement scheduled in late December here it [facility]. RE limb volumetrics today reveal that R LEG volume is dramatically increased by 59.65 since last visit. R LEG VOLUME is INCREASED in volume by 59.6%. L THIGH is INCREASED by 38 %, and RLE full limb is INCREASED by 45.38%. Pt presents with LE-related skin changes. Prolonged inflammation in the R hip joint has likely overloaded  already RLE/RLQ lymphatics.  RLE/RLQ lymphedema limits Pt's ability to perform basic and instrumental ADLs, including functional ambulation, mobility and transfers, grooming , lower body bathing and dressing, skin inspection and skin care. Pt has difficulty  reaching her lower legs and feet to apply compression wraps and don/doff        compression stockings. LE limits ability to P[perform instrumental ADLs, including driving, yard work and home management activities. Lymphedema limits her ability to participate in leisure pursuits and productive activities, and it negatively impacts body image, life roles and quality of life.   Pt will benefit from an Intensive and follow along course of lymphedema. CDT will consist of Manual Lymphatic gainage   (MLD), skin  care, therapeutic exercise and compression, wraps initially then garments. Pt will need assistance throughout CDT   for applying compression wraps due to limited hip AROM and decreased skin flexibility. Without skilled Occupational Therapy for lymphedema care, lymphedema will progress and further functional decline is expected.   In prep for upcoming hip replacement OT will educate Pt re assistive devices, including tub transfer bench and elevated toilet seat, in keeping with hip precautions.  OBJECTIVE IMPAIRMENTS: Abnormal gait, decreased activity tolerance, decreased balance, decreased knowledge of use of DME, decreased mobility, difficulty walking,  decreased ROM, decreased strength, increased edema, decreased skin flexibility, increased fascial restrictions, impaired sensation, pain, and chronic, progressive LLE/LLQ lymphatic swelling and associated pain.   ADL LIMITATIONS: carrying, lifting, bending, sitting, standing, squatting, sleeping, stairs, transfers, bed mobility, bathing, dressing, hygiene/grooming, and productive activities, leisure pursuits, social participation, body image  driving, shopping, housework, yard work, cooking, meal prep  PERSONAL FACTORS: Past/current experiences and 3+ comorbidities: Morphea Scleroderma, Mixed connective tissue disease, and osteoarthritis  are also affecting patient's functional outcome.   REHAB POTENTIAL: Good  EVALUATION COMPLEXITY: Moderate  GOALS: Goals reviewed with patient? Yes  SHORT TERM GOALS: Target date: 4th OT Rx visit   Pt will demonstrate understanding of lymphedema precautions and prevention strategies with modified independence using a printed reference to identify at least 5 precautions and discussing how s/he may implement them into daily life to reduce risk of progression with extra time.  Baseline:Max A Goal status: GOAL MET  2.  Pt will be able to apply multilayer, knee length, gradient, compression wraps to one leg at a time with modified assistance (extra time and assistive device/s) to decrease limb volume, to limit infection risk, and to limit lymphedema progression.  Baseline: Dependent Goal status: GOAL MET  LONG TERM GOALS: Target date: 01/09/24 (12 weeks)  Given this patient's Intake score 36% on the functional outcomes FOTO tool, patient will experience an increase in function of 3 points to improve basic and instrumental ADLs performance, including lymphedema self-care.  Baseline: Max A Goal status: DEFERRED as Janyth Contes has been discontinued  2.  Given this patient's Intake score of  75% on the Lymphedema Life Impact Scale (LLIS), patient will experience a  reduction of at least 5 points in her perceived level of functional impairment resulting from lymphedema to improve functional performance and quality of life (QOL). Baseline: % Goal status: PROGRESSING  3.  Pt will achieve at least a 10% volume reduction in full, RLE limb volume to return limb to typical size and shape, to limit infection risk and LE progression, to decrease pain, to improve function. Baseline: Dependent Goal status:PROGRESSING. Volumetrics deferred today by Pt request in order to increase time for manual therapy which provides pain relief. Volumetrics next visit. By visual assessment I would estimate ~10% volume reduction for entire RLE to DATE. tissue DENSITY IN THIGH HAS REDUCED MILDLY AND SLIGHTLY IN LEG.   4.  Pt will obtain proper compression garments/devices and achieve modified independence (extra time + assistive devices) with donning/doffing to optimize limb volume reductions and limit LE  progression over time. Baseline:  Goal status: 01/04/24: PARTIALLY MET; In an effort to reduce burden of care on her spouse, Pt obtained adjustable, Velcro style R full leg, Circaid leg reduction kit for use s/p THA, but this proved not effective for controlling swelling, and were not easier to don and doff more independently. We've resumed wrapping until she is able to fit back into custom compression garments.  5.  During Intensive phase CDT , with modified independence, Pt will achieve at least 85% compliance with all lymphedema self-care home program components, including daily skin care, compression wraps and /or garments, simple self MLD and lymphatic pumping therex to habituate LE self care protocol  into ADLs for optimal LE self-management over time. Baseline: Dependent Goal status: ONGOING PLAN:  OT FREQUENCY: 2x/week  OT DURATION: 12 weeks and PRN  PLANNED INTERVENTIONS: Complete Decongestive Therapy (Intensive and supported Self-Management Phases), 97110-Therapeutic  exercises, 97530- Therapeutic activity, 97535- Self Care, [id number]- Manual therapy, Patient/Family education, Taping, Manual lymph drainage, Scar mobilization, Compression bandaging, DME instructions, and skin care to reduce infection risk throughout manual therapy. Fit with replacement compression garments ASAP  PLAN FOR NEXT SESSION:  Complete RLE comparative limb volumetrics for progress note update Cont RLE/RLQ MLD Cont multilayer compression wrapping    Loel Dubonnet, MS, OTR/L, CLT-LANA 01/04/24 12:33 PM  01/04/2024, 12:33 PM

## 2024-01-05 ENCOUNTER — Ambulatory Visit

## 2024-01-05 DIAGNOSIS — R262 Difficulty in walking, not elsewhere classified: Secondary | ICD-10-CM

## 2024-01-05 DIAGNOSIS — M25651 Stiffness of right hip, not elsewhere classified: Secondary | ICD-10-CM

## 2024-01-05 DIAGNOSIS — I890 Lymphedema, not elsewhere classified: Secondary | ICD-10-CM | POA: Diagnosis not present

## 2024-01-05 DIAGNOSIS — M25551 Pain in right hip: Secondary | ICD-10-CM

## 2024-01-06 ENCOUNTER — Ambulatory Visit: Payer: BC Managed Care – PPO | Admitting: Licensed Clinical Social Worker

## 2024-01-09 ENCOUNTER — Other Ambulatory Visit: Payer: Self-pay

## 2024-01-09 ENCOUNTER — Inpatient Hospital Stay
Admission: EM | Admit: 2024-01-09 | Discharge: 2024-01-10 | DRG: 392 | Disposition: A | Discharge status: Home / Self Care | Attending: Internal Medicine | Admitting: Internal Medicine

## 2024-01-09 ENCOUNTER — Emergency Department

## 2024-01-09 ENCOUNTER — Encounter: Payer: Self-pay | Admitting: Emergency Medicine

## 2024-01-09 ENCOUNTER — Ambulatory Visit: Admitting: Occupational Therapy

## 2024-01-09 DIAGNOSIS — Z91018 Allergy to other foods: Secondary | ICD-10-CM | POA: Diagnosis not present

## 2024-01-09 DIAGNOSIS — Z885 Allergy status to narcotic agent status: Secondary | ICD-10-CM | POA: Diagnosis not present

## 2024-01-09 DIAGNOSIS — I890 Lymphedema, not elsewhere classified: Secondary | ICD-10-CM | POA: Diagnosis not present

## 2024-01-09 DIAGNOSIS — M2550 Pain in unspecified joint: Secondary | ICD-10-CM | POA: Diagnosis present

## 2024-01-09 DIAGNOSIS — Z791 Long term (current) use of non-steroidal anti-inflammatories (NSAID): Secondary | ICD-10-CM

## 2024-01-09 DIAGNOSIS — I10 Essential (primary) hypertension: Secondary | ICD-10-CM | POA: Diagnosis present

## 2024-01-09 DIAGNOSIS — Z91048 Other nonmedicinal substance allergy status: Secondary | ICD-10-CM | POA: Diagnosis not present

## 2024-01-09 DIAGNOSIS — K5900 Constipation, unspecified: Principal | ICD-10-CM

## 2024-01-09 DIAGNOSIS — I7300 Raynaud's syndrome without gangrene: Secondary | ICD-10-CM | POA: Diagnosis present

## 2024-01-09 DIAGNOSIS — Z86718 Personal history of other venous thrombosis and embolism: Secondary | ICD-10-CM

## 2024-01-09 DIAGNOSIS — Z9049 Acquired absence of other specified parts of digestive tract: Secondary | ICD-10-CM

## 2024-01-09 DIAGNOSIS — M349 Systemic sclerosis, unspecified: Secondary | ICD-10-CM | POA: Diagnosis not present

## 2024-01-09 DIAGNOSIS — K219 Gastro-esophageal reflux disease without esophagitis: Secondary | ICD-10-CM | POA: Diagnosis not present

## 2024-01-09 DIAGNOSIS — Z96641 Presence of right artificial hip joint: Secondary | ICD-10-CM | POA: Diagnosis not present

## 2024-01-09 DIAGNOSIS — R1084 Generalized abdominal pain: Principal | ICD-10-CM

## 2024-01-09 DIAGNOSIS — F32A Depression, unspecified: Secondary | ICD-10-CM | POA: Diagnosis not present

## 2024-01-09 DIAGNOSIS — Z85828 Personal history of other malignant neoplasm of skin: Secondary | ICD-10-CM | POA: Diagnosis not present

## 2024-01-09 DIAGNOSIS — Z7901 Long term (current) use of anticoagulants: Secondary | ICD-10-CM

## 2024-01-09 DIAGNOSIS — Z888 Allergy status to other drugs, medicaments and biological substances status: Secondary | ICD-10-CM

## 2024-01-09 DIAGNOSIS — F419 Anxiety disorder, unspecified: Secondary | ICD-10-CM | POA: Diagnosis present

## 2024-01-09 DIAGNOSIS — Z79899 Other long term (current) drug therapy: Secondary | ICD-10-CM

## 2024-01-09 DIAGNOSIS — Z90710 Acquired absence of both cervix and uterus: Secondary | ICD-10-CM

## 2024-01-09 DIAGNOSIS — R109 Unspecified abdominal pain: Secondary | ICD-10-CM | POA: Diagnosis not present

## 2024-01-09 DIAGNOSIS — K567 Ileus, unspecified: Secondary | ICD-10-CM | POA: Diagnosis present

## 2024-01-09 DIAGNOSIS — Z8719 Personal history of other diseases of the digestive system: Secondary | ICD-10-CM | POA: Diagnosis not present

## 2024-01-09 DIAGNOSIS — R112 Nausea with vomiting, unspecified: Secondary | ICD-10-CM

## 2024-01-09 DIAGNOSIS — J45909 Unspecified asthma, uncomplicated: Secondary | ICD-10-CM | POA: Diagnosis present

## 2024-01-09 LAB — URINALYSIS, ROUTINE W REFLEX MICROSCOPIC
Bacteria, UA: NONE SEEN
Bilirubin Urine: NEGATIVE
Glucose, UA: NEGATIVE mg/dL
Ketones, ur: NEGATIVE mg/dL
Leukocytes,Ua: NEGATIVE
Nitrite: NEGATIVE
Protein, ur: NEGATIVE mg/dL
Specific Gravity, Urine: 1.027 (ref 1.005–1.030)
pH: 5 (ref 5.0–8.0)

## 2024-01-09 LAB — COMPREHENSIVE METABOLIC PANEL
ALT: 11 U/L (ref 0–44)
AST: 18 U/L (ref 15–41)
Albumin: 4 g/dL (ref 3.5–5.0)
Alkaline Phosphatase: 66 U/L (ref 38–126)
Anion gap: 12 (ref 5–15)
BUN: 13 mg/dL (ref 8–23)
CO2: 23 mmol/L (ref 22–32)
Calcium: 9.3 mg/dL (ref 8.9–10.3)
Chloride: 102 mmol/L (ref 98–111)
Creatinine, Ser: 0.59 mg/dL (ref 0.44–1.00)
GFR, Estimated: >60 mL/min (ref >60)
Glucose, Bld: 108 mg/dL — ABNORMAL HIGH (ref 70–99)
Potassium: 4 mmol/L (ref 3.5–5.1)
Sodium: 137 mmol/L (ref 135–145)
Total Bilirubin: 0.5 mg/dL (ref 0.0–1.2)
Total Protein: 7.9 g/dL (ref 6.5–8.1)

## 2024-01-09 LAB — CBC
HCT: 41.2 % (ref 36.0–46.0)
Hemoglobin: 13.2 g/dL (ref 12.0–15.0)
MCH: 27.4 pg (ref 26.0–34.0)
MCHC: 32 g/dL (ref 30.0–36.0)
MCV: 85.5 fL (ref 80.0–100.0)
Platelets: 421 10*3/uL — ABNORMAL HIGH (ref 150–400)
RBC: 4.82 MIL/uL (ref 3.87–5.11)
RDW: 14.5 % (ref 11.5–15.5)
WBC: 8.9 10*3/uL (ref 4.0–10.5)
nRBC: 0 % (ref 0.0–0.2)

## 2024-01-09 LAB — LIPASE, BLOOD: Lipase: 30 U/L (ref 11–51)

## 2024-01-09 MED ORDER — IOHEXOL 300 MG/ML  SOLN
100.0000 mL | Freq: Once | INTRAMUSCULAR | Status: AC | PRN
  Administered 2024-01-09: 100 mL via INTRAVENOUS

## 2024-01-09 MED ORDER — ONDANSETRON HCL 4 MG/2ML IJ SOLN
4.0000 mg | Freq: Once | INTRAMUSCULAR | Status: DC
  Filled 2024-01-09: qty 2

## 2024-01-09 MED ORDER — MORPHINE SULFATE (PF) 4 MG/ML IV SOLN
4.0000 mg | Freq: Once | INTRAVENOUS | Status: AC
Start: 2024-01-09 — End: 2024-01-10
  Administered 2024-01-10: 4 mg via INTRAVENOUS
  Filled 2024-01-09: qty 1

## 2024-01-09 MED ORDER — SODIUM CHLORIDE 0.9 % IV BOLUS
1000.0000 mL | Freq: Once | INTRAVENOUS | Status: AC
  Administered 2024-01-10: 1000 mL via INTRAVENOUS

## 2024-01-09 NOTE — ED Triage Notes (Signed)
Pt in via POV, reports severe lower abdominal pain w/ N/V.  Last normal BM unknown.  Reports hx of gi strictures and blockages.  Appears uncomfortable in triage.

## 2024-01-09 NOTE — ED Provider Notes (Signed)
[facility] Provider Note    Event Date/Time   First MD Initiated Contact with Patient 01/09/24 2319     (approximate)   History   Abdominal Pain   HPI  Elizabeth Martin is a 63 y.o. female who presents to the ED from home with a chief complaint of lower abdominal pain, nausea and vomiting.  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  Passing gas minimally.  Last BM very hard and small, yesterday.  Takes MiraLAX daily.  History of DVT on Eliquis.  Denies fever/chills, chest pain, shortness of breath, dysuria or diarrhea.     Past Medical History   Past Medical History:  Diagnosis Date   Anxiety    Asthma    Autoimmune disease (HCC)    Cancer (HCC)    Endometrial lymph node removal (30)   Corneal abrasion, right 05/24/2022   Depression    GERD (gastroesophageal reflux disease)    H/O blood clots    upper rt groin and behind both knees   History of hiatal hernia    Hypertension    Lymphedema    right leg   Morphea scleroderma    PONV (postoperative nausea and vomiting)    states gets violently ill     Active Problem List   Patient Active Problem List   Diagnosis Date Noted   S/P total right hip arthroplasty 11/14/2023   Blood coagulation disorder (HCC) 11/10/2023   PTSD (post-traumatic stress disorder) 11/10/2023   Severe episode of recurrent major depressive disorder, without psychotic features (HCC) 11/10/2023   GAD (generalized anxiety disorder) 11/10/2023   High risk medication use 11/10/2023   Anxiety and depression 03/22/2023   Cervical radiculopathy 03/16/2023   Colon cancer screening 03/15/2023   Chronic idiopathic constipation 03/15/2023   Dyspnea 03/07/2023   [place] spotted fever 03/07/2023    Strain of gastrocnemius tendon 03/07/2023   Lower abdominal pain 03/07/2023   Pain in pelvis 03/07/2023   Small bowel obstruction (HCC) 02/11/2023   Raynaud disease 02/11/2023   Polyarthralgia 02/11/2023   Edema 01/03/2023   Diverticular disease 12/10/2022   Hot flashes 12/10/2022   Pain of right lower extremity 12/10/2022   Paresthesia of lower extremity 12/10/2022   Primary insomnia 12/10/2022   Thyroid nodule 12/10/2022   Multiple joint pain 12/10/2022   Basal cell carcinoma of skin 12/10/2022   Acute bilateral low back pain without sciatica 11/10/2022   Chronic lower back pain 09/05/2022   S/P closure of ileostomy 08/24/2022   Ileostomy in place [facility])    Morphea 07/13/2022   Colonic stricture (HCC) 05/19/2022   Large bowel obstruction (HCC) 05/19/2022   Hypokalemia 05/18/2022   Abdominal pain 05/18/2022   Osteoarthritis of left knee 02/24/2022   Mixed connective tissue disease (HCC) 02/17/2022   Adhesive capsulitis of left shoulder 01/27/2022   Glenohumeral arthritis, left 01/27/2022   Microscopic hematuria 01/12/2022   Otorrhagia of right ear 12/10/2021   Elevated testosterone level in female 11/04/2021   Anti-TPO antibodies present 10/20/2021   Rash 10/13/2021   Hashimoto thyroiditis, 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   Hashimoto's disease 03/23/2021   Exposure to severe acute respiratory syndrome coronavirus 2 (SARS-CoV-2) 12/19/2020   Migraine without aura and without status migrainosus, not intractable 08/19/2020   Myofascial pain 07/30/2020   Neck pain 07/30/2020   Upper  back pain 07/30/2020   Cervical facet joint syndrome 07/30/2020   Hair loss 07/14/2020   Edema of lower extremity 07/11/2020   Adenomatous colon polyp 02/26/2020   Gastroesophageal reflux disease 02/15/2020   Diarrhea 02/15/2020   Sleep disorder 01/16/2020   Hyperlipidemia 07/11/2019   Right shoulder pain 04/04/2019   Pre-operative clearance 12/13/2018   Endometrial cancer (HCC) 04/05/2018    Patellofemoral pain syndrome of right knee 03/20/2018   Gastroenteritis 12/07/2017   Fatigue 11/16/2017   Morbid obesity (HCC) 08/11/2017   Stucco keratoses 08/11/2017   Essential hypertension 08/09/2017   History of endometrial cancer 08/09/2017   Lymphedema 08/09/2017   Menopause 08/09/2017   Malignant neoplastic disease (HCC) 04/07/2017   Cellulitis 09/18/2016   Benign hypertension 06/02/2016   Asthma 06/04/1969     Past Surgical History   Past Surgical History:  Procedure Laterality Date   BASAL CELL CARCINOMA EXCISION Left    CHOLECYSTECTOMY  2008   FLEXIBLE SIGMOIDOSCOPY N/A 05/19/2022   Procedure: FLEXIBLE SIGMOIDOSCOPY;  Surgeon: Toledo, Boykin Nearing, MD;  Location: [facility] ENDOSCOPY;  Service: Gastroenterology;  Laterality: N/A;   HERNIA REPAIR  2004   ILEOSTOMY CLOSURE N/A 08/24/2022   Procedure: ILEOSTOMY TAKEDOWN, open loop with Lynden Oxford, PA-C to assist;  Surgeon: Henrene Dodge, MD;  Location: [facility] ORS;  Service: General;  Laterality: N/A;   IVC FILTER INSERTION N/A 11/09/2023   Procedure: IVC FILTER INSERTION;  Surgeon: Annice Needy, MD;  Location: [facility] INVASIVE CV LAB;  Service: Cardiovascular;  Laterality: N/A;   IVC FILTER REMOVAL N/A 01/02/2024   Procedure: IVC FILTER REMOVAL;  Surgeon: Annice Needy, MD;  Location: [facility] INV[facility] CV LAB;  Service: Cardiovascular;  Laterality: N/A;   PLANTAR FASCIECTOMY     ROBOTIC ASSISTED TOTAL HYSTERECTOMY WITH BILATERAL SALPINGO OOPHERECTOMY  02/14/2012   ROTATOR CUFF REPAIR Left 03/24/2022   TONSILLECTOMY     TOTAL HIP ARTHROPLASTY Right 11/14/2023   Procedure: TOTAL HIP ARTHROPLASTY - posterior;  Surgeon: Reinaldo Berber, MD;  Location: [facility] ORS;  Service: Orthopedics;  Laterality: Right;   XI ROBOTIC ASSISTED LOWER ANTERIOR RESECTION N/A 05/24/2022   Procedure: XI ROBOTIC ASSISTED LOWER ANTERIOR RESECTION;  Surgeon: Henrene Dodge, MD;  Location: [facility] ORS;  Service: General;  Laterality: N/A;     Home Medications   Prior  to Admission medications   Medication Sig Start Date End Date Taking? Authorizing Provider  acetaminophen (TYLENOL) 500 MG tablet Take 2 tablets (1,000 mg total) by mouth every 8 (eight) hours. 11/15/23   Evon Slack, PA-C  albuterol (VENTOLIN HFA) 108 (90 Base) MCG/ACT inhaler Inhale 2 puffs into the lungs every 6 (six) hours as needed for shortness of breath or wheezing. 01/11/22   [provider]  Alum Hydroxide-Mag Carbonate (GAVISCON PO) Take 4 tablets by mouth daily as needed (upset stomach).    [provider]  apixaban (ELIQUIS) 2.5 MG TABS tablet Take 1 tablet (2.5 mg total) by mouth every 12 (twelve) hours. 11/15/23   Evon Slack, PA-C  buPROPion (WELLBUTRIN XL) 150 MG 24 hr tablet Take 1 tablet (150 mg total) by mouth in the morning. 12/20/23   Jomarie Longs, MD  busPIRone (BUSPAR) 10 MG tablet Take 10 mg by mouth 2 (two) times daily.    [provider]  celecoxib (CELEBREX) 200 MG capsule Take 1 capsule (200 mg total) by mouth daily. 11/15/23   Evon Slack, PA-C  cetirizine (ZYRTEC) 10 MG tablet Take 10 mg by mouth daily as needed for allergies.  08/10/21   [provider]  CLOBETASOL PROPIONATE E 0.05 % emollient cream Apply topically. 07/22/23   [provider]  desonide (DESOWEN) 0.05 % cream Apply 1 Application topically 2 (two) times daily as needed (morphea flare). 01/31/23   [provider]  DULoxetine (CYMBALTA) 30 MG capsule Take 1 capsule (30 mg total) by mouth daily. 12/20/23   Jomarie Longs, MD  famotidine-calcium carbonate-magnesium hydroxide (PEPCID COMPLETE) 10-800-165 MG chewable tablet Chew 1 tablet by mouth daily as needed (heartburn).    [provider]  folic acid (FOLVITE) 1 MG tablet Take 1 mg by mouth daily. 07/13/22   [provider]  hydrocortisone-pramoxine [facility]) 2.5-1 % rectal cream Place 1 Application rectally 3 (three) times daily as needed for hemorrhoids. 10/03/23    [provider]  hydroxychloroquine (PLAQUENIL) 200 MG tablet Take 200 mg by mouth 2 (two) times daily. 01/11/22   [provider]  lisinopril (ZESTRIL) 40 MG tablet Take 40 mg by mouth daily. 12/29/22   [provider]  methotrexate (RHEUMATREX) 2.5 MG tablet Take 3 tablets by mouth 2 (two) times a week. Take 7.5 mg on Mondays and Tuesdays 08/08/22   [provider]  Multiple Vitamin (MULTIVITAMIN) capsule Take 1 capsule by mouth daily.    [provider]  omeprazole (PRILOSEC) 40 MG capsule Take 40 mg by mouth daily. 12/29/22   [provider]  ondansetron (ZOFRAN) 4 MG tablet Take 1 tablet (4 mg total) by mouth every 6 (six) hours as needed for nausea. 11/15/23   Evon Slack, PA-C  ondansetron (ZOFRAN-ODT) 8 MG disintegrating tablet Take 8 mg by mouth every 8 (eight) hours as needed for vomiting or nausea. 12/29/22   [provider]  traZODone (DESYREL) 50 MG tablet Take 1 tablet (50 mg total) by mouth at bedtime as needed for sleep. 11/10/23   Jomarie Longs, MD  triamcinolone (KENALOG) 0.025 % cream Apply 1 Application topically 2 (two) times daily as needed (morphea flare). 12/15/22   [provider]  trospium (SANCTURA) 20 MG tablet Take 1 tablet (20 mg total) by mouth 2 (two) times daily as needed (overactive bladder). 11/15/23   Stoioff, Verna Czech, MD     Allergies  Carboplatin, Hydromorphone, Latex, Nitrofurantoin, Silicone, Other, Wheat, and Oxycodone   Family History   Family History  Problem Relation Age of Onset   Mental illness Neg Hx      Physical Exam  Triage Vital Signs: ED Triage Vitals  Encounter Vitals Group     BP 01/09/24 1640 (!) 162/84     Systolic BP Percentile --      Diastolic BP Percentile --      Pulse Rate 01/09/24 1640 (!) 115     Resp 01/09/24 1640 17     Temp 01/09/24 1640 98.6 F (37 C)     Temp Source 01/09/24 1640 Oral     SpO2 01/09/24 1640 96 %     Weight 01/09/24 1642 149  lb 14.6 oz (68 kg)     Height 01/09/24 1642 5\' 7"  (1.702 m)     Head Circumference --      Peak Flow --      Pain Score 01/09/24 1642 10     Pain Loc --      Pain Education --      Exclude from Growth Chart --     Updated Vital Signs: BP (!) 162/84 (BP Location: Left Arm)   Pulse (!) 115   Temp  98.6 F (37 C) (Oral)   Resp 17   Ht 5\' 7"  (1.702 m)   Wt 68 kg   SpO2 96%   BMI 23.48 kg/m    General: Awake, moderate distress.  CV:  RRR.  Good peripheral perfusion.  Resp:  Normal effort.  CTAB. Abd:  Mild to moderate lower abdominal tenderness to palpation without rebound or guarding.  Mild distention.  Other:  No truncal vesicles.   ED Results / Procedures / Treatments  Labs (all labs ordered are listed, but only abnormal results are displayed) Labs Reviewed  COMPREHENSIVE METABOLIC PANEL - Abnormal; Notable for the following components:      Result Value   Glucose, Bld 108 (*)    All other components within normal limits  CBC - Abnormal; Notable for the following components:   Platelets 421 (*)    All other components within normal limits  URINALYSIS, ROUTINE W REFLEX MICROSCOPIC - Abnormal; Notable for the following components:   Color, Urine YELLOW (*)    APPearance CLEAR (*)    Hgb urine dipstick SMALL (*)    All other components within normal limits  LIPASE, BLOOD     EKG  None   RADIOLOGY I have independently visualized and interpreted patient's imaging study as well as noted the radiology interpretation:  CT abdomen/pelvis: No acute findings; large stool burden noted  Official radiology report(s): CT ABDOMEN PELVIS W CONTRAST Result Date: 01/09/2024 CLINICAL DATA:  Left lower quadrant pain.  Constipation. EXAM: CT ABDOMEN AND PELVIS WITH CONTRAST TECHNIQUE: Multidetector CT imaging of the abdomen and pelvis was performed using the standard protocol following bolus administration of intravenous contrast. RADIATION DOSE REDUCTION: This exam was performed  according to the departmental dose-optimization program which includes automated exposure control, adjustment of the mA and/or kV according to patient size and/or use of iterative reconstruction technique. CONTRAST:  OMNIPAQUE IOHEXOL 300 MG/ML  SOLN COMPARISON:  02/11/2023 FINDINGS: Lower Chest: No acute findings. Hepatobiliary: No suspicious hepatic masses identified. Prior cholecystectomy. No evidence of biliary obstruction. Pancreas:  No mass or inflammatory changes. Spleen: Within normal limits in size and appearance. Adrenals/Urinary Tract: No suspicious masses identified. No evidence of ureteral calculi or hydronephrosis. Stomach/Bowel: Small hiatal hernia again noted. No evidence of obstruction, inflammatory process or abnormal fluid collections. Normal appendix visualized. Large amount of stool seen throughout the colon. Vascular/Lymphatic: No pathologically enlarged lymph nodes. No acute vascular findings. Reproductive: Prior hysterectomy noted. Adnexal regions are unremarkable in appearance. Other:  None. Musculoskeletal: No suspicious bone lesions identified. Right hip prosthesis noted. IMPRESSION: No acute findings. Large stool burden noted; recommend correlation for symptoms or signs of constipation. Small hiatal hernia. Electronically Signed   By: Danae Orleans M.D.   On: 01/09/2024 19:27     PROCEDURES:  Critical Care performed: {CriticalCareYesNo:19197::"Yes, see critical care procedure note(s)","No"}  .1-3 Lead EKG Interpretation  Performed by: Irean Hong, MD Authorized by: Irean Hong, MD     Interpretation: normal     ECG rate:  95   ECG rate assessment: normal     Rhythm: sinus rhythm     Ectopy: none     Conduction: normal   Comments:     Patient placed on cardiac monitor to evaluate for arrhythmias    MEDICATIONS ORDERED IN ED: Medications  ondansetron (ZOFRAN) injection 4 mg (has no administration in time range)  sodium chloride 0.9 % bolus 1,000 mL (has no  administration in time range)  morphine (PF) 4 MG/ML injection  4 mg (has no administration in time range)  iohexol (OMNIPAQUE) 300 MG/ML solution 100 mL (100 mLs Intravenous Contrast Given 01/09/24 1816)     IMPRESSION / MDM / ASSESSMENT AND PLAN / ED COURSE  I reviewed the triage vital signs and the nursing notes.                             63 year old female presenting with abdominal pain, nausea and vomiting. Differential diagnosis includes, but is not limited to, ovarian cyst, ovarian torsion, acute appendicitis, diverticulitis, urinary tract infection/pyelonephritis, endometriosis, bowel obstruction, colitis, renal colic, gastroenteritis, hernia, etc. I have personally reviewed patient's records and note her last GI office visit from 12/22/2023.  Patient's presentation is most consistent with acute presentation with potential threat to life or bodily function.  The patient is on the cardiac monitor to evaluate for evidence of arrhythmia and/or significant heart rate changes.  Laboratory results demonstrate normal WBC 8.9, unremarkable electrolytes, UA negative for infection.  CT abdomen/pelvis demonstrates large stool burden.  Clinically patient has ileus.  Will start IV hydration, IV morphine for pain, IV Zofran for nausea/vomiting.  Will reassess.      FINAL CLINICAL IMPRESSION(S) / ED DIAGNOSES   Final diagnoses:  Generalized abdominal pain  Ileus (HCC)  Nausea and vomiting, unspecified vomiting type  Constipation, unspecified constipation type     Rx / DC Orders   ED Discharge Orders     None        Note:  This document was prepared using Dragon voice recognition software and may include unintentional dictation errors.

## 2024-01-09 NOTE — ED Provider Triage Note (Signed)
Emergency Medicine Provider Triage Evaluation Note  Elizabeth Martin , a 63 y.o. female  was evaluated in triage.  Pt complains of lower abdominal pain, yesterday have a bowel movement after 2 months.  Has history of multiple abdominal surgeries.  Patient has been seen by GI who is management of the constipation  Review of Systems  Positive:  Negative:   Physical Exam  BP (!) 162/84 (BP Location: Left Arm)   Pulse (!) 115   Temp 98.6 F (37 C) (Oral)   Resp 17   SpO2 96%  Gen:   Awake, no distress   Resp:  Normal effort  MSK:   Moves extremities without difficulty  Other:    Medical Decision Making  Medically screening exam initiated at 4:42 PM.  Appropriate orders placed.  Latissa Frick was informed that the remainder of the evaluation will be completed by another provider, this initial triage assessment does not replace that evaluation, and the importance of remaining in the ED until their evaluation is complete.  Patient with abdominal pain and history of multiple surgeries ordered CT scan of the abdomen, see CMP   Gladys Damme, PA-C 01/09/24 1643

## 2024-01-10 ENCOUNTER — Ambulatory Visit: Payer: Self-pay | Admitting: Licensed Clinical Social Worker

## 2024-01-10 DIAGNOSIS — K567 Ileus, unspecified: Secondary | ICD-10-CM

## 2024-01-10 DIAGNOSIS — Z791 Long term (current) use of non-steroidal anti-inflammatories (NSAID): Secondary | ICD-10-CM | POA: Diagnosis not present

## 2024-01-10 DIAGNOSIS — R109 Unspecified abdominal pain: Secondary | ICD-10-CM | POA: Diagnosis present

## 2024-01-10 DIAGNOSIS — K219 Gastro-esophageal reflux disease without esophagitis: Secondary | ICD-10-CM

## 2024-01-10 DIAGNOSIS — F32A Depression, unspecified: Secondary | ICD-10-CM | POA: Diagnosis present

## 2024-01-10 DIAGNOSIS — K5900 Constipation, unspecified: Secondary | ICD-10-CM

## 2024-01-10 DIAGNOSIS — Z85828 Personal history of other malignant neoplasm of skin: Secondary | ICD-10-CM | POA: Diagnosis not present

## 2024-01-10 DIAGNOSIS — R112 Nausea with vomiting, unspecified: Secondary | ICD-10-CM

## 2024-01-10 DIAGNOSIS — M2550 Pain in unspecified joint: Secondary | ICD-10-CM

## 2024-01-10 DIAGNOSIS — Z7901 Long term (current) use of anticoagulants: Secondary | ICD-10-CM | POA: Diagnosis not present

## 2024-01-10 DIAGNOSIS — F419 Anxiety disorder, unspecified: Secondary | ICD-10-CM | POA: Diagnosis present

## 2024-01-10 DIAGNOSIS — Z9049 Acquired absence of other specified parts of digestive tract: Secondary | ICD-10-CM | POA: Diagnosis not present

## 2024-01-10 DIAGNOSIS — Z91048 Other nonmedicinal substance allergy status: Secondary | ICD-10-CM | POA: Diagnosis not present

## 2024-01-10 DIAGNOSIS — I890 Lymphedema, not elsewhere classified: Secondary | ICD-10-CM | POA: Diagnosis present

## 2024-01-10 DIAGNOSIS — Z86718 Personal history of other venous thrombosis and embolism: Secondary | ICD-10-CM | POA: Diagnosis not present

## 2024-01-10 DIAGNOSIS — I10 Essential (primary) hypertension: Secondary | ICD-10-CM | POA: Diagnosis present

## 2024-01-10 DIAGNOSIS — Z90710 Acquired absence of both cervix and uterus: Secondary | ICD-10-CM | POA: Diagnosis not present

## 2024-01-10 DIAGNOSIS — Z79899 Other long term (current) drug therapy: Secondary | ICD-10-CM | POA: Diagnosis not present

## 2024-01-10 DIAGNOSIS — Z885 Allergy status to narcotic agent status: Secondary | ICD-10-CM | POA: Diagnosis not present

## 2024-01-10 DIAGNOSIS — Z888 Allergy status to other drugs, medicaments and biological substances status: Secondary | ICD-10-CM | POA: Diagnosis not present

## 2024-01-10 DIAGNOSIS — M349 Systemic sclerosis, unspecified: Secondary | ICD-10-CM | POA: Diagnosis present

## 2024-01-10 DIAGNOSIS — Z91018 Allergy to other foods: Secondary | ICD-10-CM | POA: Diagnosis not present

## 2024-01-10 DIAGNOSIS — J45909 Unspecified asthma, uncomplicated: Secondary | ICD-10-CM | POA: Diagnosis present

## 2024-01-10 DIAGNOSIS — K5909 Other constipation: Secondary | ICD-10-CM

## 2024-01-10 DIAGNOSIS — I7300 Raynaud's syndrome without gangrene: Secondary | ICD-10-CM | POA: Diagnosis present

## 2024-01-10 DIAGNOSIS — Z96641 Presence of right artificial hip joint: Secondary | ICD-10-CM | POA: Diagnosis present

## 2024-01-10 DIAGNOSIS — Z8719 Personal history of other diseases of the digestive system: Secondary | ICD-10-CM | POA: Diagnosis not present

## 2024-01-10 LAB — HIV ANTIBODY (ROUTINE TESTING W REFLEX): HIV Screen 4th Generation wRfx: NONREACTIVE

## 2024-01-10 MED ORDER — HYDROXYCHLOROQUINE SULFATE 200 MG PO TABS
200.0000 mg | ORAL_TABLET | Freq: Two times a day (BID) | ORAL | Status: DC
  Administered 2024-01-10: 200 mg via ORAL
  Filled 2024-01-10: qty 1

## 2024-01-10 MED ORDER — DEXTROSE-SODIUM CHLORIDE 5-0.9 % IV SOLN: INTRAVENOUS | Status: DC

## 2024-01-10 MED ORDER — DIPHENHYDRAMINE HCL 50 MG/ML IJ SOLN
12.5000 mg | Freq: Once | INTRAMUSCULAR | Status: AC
  Administered 2024-01-10: 12.5 mg via INTRAVENOUS
  Filled 2024-01-10: qty 1

## 2024-01-10 MED ORDER — PANTOPRAZOLE SODIUM 40 MG PO TBEC
40.0000 mg | DELAYED_RELEASE_TABLET | Freq: Every day | ORAL | Status: DC
Start: 2024-01-10 — End: 2024-01-10
  Administered 2024-01-10: 40 mg via ORAL
  Filled 2024-01-10: qty 1

## 2024-01-10 MED ORDER — DULOXETINE HCL 30 MG PO CPEP
30.0000 mg | ORAL_CAPSULE | Freq: Every day | ORAL | Status: DC
Start: 2024-01-10 — End: 2024-01-10
  Administered 2024-01-10: 30 mg via ORAL
  Filled 2024-01-10: qty 1

## 2024-01-10 MED ORDER — BUPROPION HCL ER (XL) 150 MG PO TB24
150.0000 mg | ORAL_TABLET | Freq: Every morning | ORAL | Status: DC
  Administered 2024-01-10: 150 mg via ORAL
  Filled 2024-01-10: qty 1

## 2024-01-10 MED ORDER — MORPHINE SULFATE (PF) 2 MG/ML IV SOLN: 2.0000 mg | INTRAVENOUS | Status: DC | PRN

## 2024-01-10 MED ORDER — MAGNESIUM CITRATE PO SOLN
1.0000 | Freq: Once | ORAL | Status: AC
Start: 2024-01-10 — End: 2024-01-10
  Administered 2024-01-10: 1 via ORAL
  Filled 2024-01-10: qty 296

## 2024-01-10 MED ORDER — ACETAMINOPHEN 325 MG RE SUPP: 650.0000 mg | Freq: Four times a day (QID) | RECTAL | Status: DC | PRN

## 2024-01-10 MED ORDER — ONDANSETRON HCL 4 MG/2ML IJ SOLN
4.0000 mg | Freq: Four times a day (QID) | INTRAMUSCULAR | Status: DC | PRN
  Administered 2024-01-10: 4 mg via INTRAVENOUS
  Filled 2024-01-10: qty 2

## 2024-01-10 MED ORDER — HYDRALAZINE HCL 20 MG/ML IJ SOLN: 5.0000 mg | INTRAMUSCULAR | Status: DC | PRN

## 2024-01-10 MED ORDER — ACETAMINOPHEN 325 MG PO TABS: 650.0000 mg | ORAL_TABLET | Freq: Four times a day (QID) | ORAL | Status: DC | PRN

## 2024-01-10 MED ORDER — BUSPIRONE HCL 5 MG PO TABS
10.0000 mg | ORAL_TABLET | Freq: Two times a day (BID) | ORAL | Status: DC
  Administered 2024-01-10: 10 mg via ORAL
  Filled 2024-01-10: qty 2

## 2024-01-10 MED ORDER — FENTANYL CITRATE PF 50 MCG/ML IJ SOSY
50.0000 ug | PREFILLED_SYRINGE | Freq: Once | INTRAMUSCULAR | Status: AC
Start: 2024-01-10 — End: 2024-01-10
  Administered 2024-01-10: 50 ug via INTRAVENOUS
  Filled 2024-01-10: qty 1

## 2024-01-10 MED ORDER — PROMETHAZINE HCL 25 MG/ML IJ SOLN
25.0000 mg | Freq: Once | INTRAMUSCULAR | Status: AC
  Administered 2024-01-10: 25 mg via INTRAMUSCULAR
  Filled 2024-01-10: qty 1

## 2024-01-10 MED ORDER — ONDANSETRON HCL 4 MG PO TABS: 4.0000 mg | ORAL_TABLET | Freq: Four times a day (QID) | ORAL | Status: DC | PRN

## 2024-01-10 MED ORDER — TRAZODONE HCL 50 MG PO TABS: 50.0000 mg | ORAL_TABLET | Freq: Every evening | ORAL | Status: DC | PRN

## 2024-01-10 MED ORDER — LISINOPRIL 10 MG PO TABS
40.0000 mg | ORAL_TABLET | Freq: Every day | ORAL | Status: DC
Start: 2024-01-10 — End: 2024-01-10
  Administered 2024-01-10: 40 mg via ORAL
  Filled 2024-01-10: qty 4

## 2024-01-10 MED ORDER — LACTULOSE 10 GM/15ML PO SOLN
30.0000 g | Freq: Two times a day (BID) | ORAL | Status: DC
Start: 2024-01-10 — End: 2024-01-10
  Administered 2024-01-10: 30 g via ORAL
  Filled 2024-01-10: qty 60

## 2024-01-10 MED ORDER — POLYETHYLENE GLYCOL 3350 17 G PO PACK: 17.0000 g | PACK | Freq: Every day | ORAL | Status: DC

## 2024-01-10 MED ORDER — SODIUM CHLORIDE 0.9 % IV SOLN
12.5000 mg | Freq: Once | INTRAVENOUS | Status: DC
  Filled 2024-01-10: qty 0.5

## 2024-01-10 MED ORDER — ENOXAPARIN SODIUM 40 MG/0.4ML IJ SOSY
40.0000 mg | PREFILLED_SYRINGE | INTRAMUSCULAR | Status: DC
  Administered 2024-01-10: 40 mg via SUBCUTANEOUS
  Filled 2024-01-10: qty 0.4

## 2024-01-10 NOTE — Assessment & Plan Note (Signed)
On lisinopril

## 2024-01-10 NOTE — Discharge Summary (Signed)
 Physician Discharge Summary   Patient: Elizabeth Martin MRN: [id number] DOB: [birth year]  Admit date:     01/09/2024  Discharge date: 01/10/24  Discharge Physician: Charlie Patterson   PCP: Tobie Border, MD   Recommendations at discharge:   Follow-up PCP 5 days  Discharge Diagnoses: Principal Problem:   Constipation Active Problems:   Nausea & vomiting   Essential hypertension   Gastroesophageal reflux disease   Lymphedema   Polyarthralgia    Hospital Course: 63 y.o. female with medical history significant for ANA positive polyarthralgia, Raynaud's disease without gangrene, long-term use of immunosuppressant medication, hypertension, history of cholecystectomy, loop ileostomy, recurrent SBO who is being admitted for ileus after presenting to the ED for chief concerns of abdominal pain, nausea, vomiting.  She has been constipated and states her stool is in the form of pellets.  She has had no bowel movement in the past 24 hours she has been using lactulose , Linzess, Dulcolax, MiraLAX  under the guidance of her doctors without relief.  She is passing gas.  States her pain is severe. ED course: Tachycardic and hypertensive with otherwise normal vitals and labs unremarkable CT abdomen showing large stool burden but no acute findings.  2/4.  Patient had bowel movement with mag citrate and lactulose .  Since she was moved in the hallway and it is a long way to the bathroom patient would rather go home and use the bathroom.  Able to tolerate food.  Abdominal pain much improved.    Assessment and Plan: * Constipation Patient was given mag citrate and and lactulose .  Patient had 2 large bowel movements.  She would feel more comfortable using the bathroom at home.  She has lactulose  at home.  Recommended MiraLAX  daily.  Nausea & vomiting Patient was able to tolerate some food and wanted to go home.  Has nausea medications at home.  Essential hypertension On lisinopril   Gastroesophageal  reflux disease Continue PPI  Lymphedema Chronic lymphedema right leg.  Polyarthralgia Continue usual medications.         Consultants: None Procedures performed: None Disposition: Home Diet recommendation:  Cardiac diet DISCHARGE MEDICATION: Allergies as of 01/10/2024       Reactions   Carboplatin Anaphylaxis      Hydromorphone  Anaphylaxis   Stopped breathing Respiratory arrest - due to dosage administered per the patient.   Latex Hives, Itching, Rash   Nitrofurantoin Nausea And Vomiting   Silicone Itching, Rash   Codeine Nausea And Vomiting   Other Nausea And Vomiting   Any codone medications Violently sick even with zofran  per patient report   Wheat Other (See Comments)   Body aches and swelling   Oxycodone  Nausea And Vomiting        Medication List     STOP taking these medications    apixaban  2.5 MG Tabs tablet Commonly known as: ELIQUIS    celecoxib  200 MG capsule Commonly known as: CELEBREX    multivitamin capsule       TAKE these medications    acetaminophen  500 MG tablet Commonly known as: TYLENOL  Take 2 tablets (1,000 mg total) by mouth every 8 (eight) hours.   albuterol  108 (90 Base) MCG/ACT inhaler Commonly known as: VENTOLIN  HFA Inhale 2 puffs into the lungs every 6 (six) hours as needed for shortness of breath or wheezing.   budesonide-formoterol 80-4.5 MCG/ACT inhaler Commonly known as: SYMBICORT Inhale 2 puffs into the lungs every 4 (four) hours as needed (Shortness of breath).   buPROPion  150 MG 24 hr tablet Commonly  known as: Wellbutrin  XL Take 1 tablet (150 mg total) by mouth in the morning.   busPIRone  10 MG tablet Commonly known as: BUSPAR  Take 10 mg by mouth 2 (two) times daily.   cetirizine 10 MG tablet Commonly known as: ZYRTEC Take 10 mg by mouth daily.   Clobetasol Propionate E 0.05 % emollient cream Generic drug: Clobetasol Prop Emollient Base Apply topically.   desonide 0.05 % cream Commonly known as:  DESOWEN Apply 1 Application topically 2 (two) times daily as needed (morphea flare).   DULoxetine  30 MG capsule Commonly known as: CYMBALTA  Take 1 capsule (30 mg total) by mouth daily.   famotidine -calcium carbonate-magnesium  hydroxide 10-800-165 MG chewable tablet Commonly known as: PEPCID  COMPLETE Chew 1 tablet by mouth daily as needed (heartburn).   folic acid 1 MG tablet Commonly known as: FOLVITE Take 1 mg by mouth daily.   GAVISCON PO Take 4 tablets by mouth daily as needed (upset stomach).   hydrocortisone-pramoxine 2.5-1 % rectal cream Commonly known as: ANALPRAM-HC Place 1 Application rectally 3 (three) times daily as needed for hemorrhoids.   hydroxychloroquine  200 MG tablet Commonly known as: PLAQUENIL  Take 200 mg by mouth 2 (two) times daily.   lisinopril  40 MG tablet Commonly known as: ZESTRIL  Take 40 mg by mouth daily.   methotrexate 2.5 MG tablet Commonly known as: RHEUMATREX Take 10 mg by mouth 2 (two) times a week. Take 10 mg on Monday and Tuesday   omeprazole 40 MG capsule Commonly known as: PRILOSEC Take 40 mg by mouth daily.   ondansetron  4 MG disintegrating tablet Commonly known as: ZOFRAN -ODT Take 4 mg by mouth every 8 (eight) hours as needed for nausea or vomiting.   promethazine  12.5 MG tablet Commonly known as: PHENERGAN  Take 12.5 mg by mouth every 8 (eight) hours as needed.   silver sulfADIAZINE 1 % cream Commonly known as: SILVADENE Apply 1 Application topically daily.   traZODone  50 MG tablet Commonly known as: DESYREL  Take 1 tablet (50 mg total) by mouth at bedtime as needed for sleep.   triamcinolone 0.025 % cream Commonly known as: KENALOG Apply 1 Application topically 2 (two) times daily as needed (morphea flare).   trospium  20 MG tablet Commonly known as: SANCTURA  Take 1 tablet (20 mg total) by mouth 2 (two) times daily as needed (overactive bladder).        Follow-up Information     Tobie Border, MD Follow up in 5  day(s).   Specialty: Family Medicine Contact information: [address] [place] [address] [phone]                Discharge Exam: Filed Weights   01/09/24 1642  Weight: 68 kg   Physical Exam HENT:     Head: Normocephalic.     Mouth/Throat:     Pharynx: No oropharyngeal exudate.  Eyes:     General: Lids are normal.     Conjunctiva/sclera: Conjunctivae normal.  Cardiovascular:     Rate and Rhythm: Normal rate and regular rhythm.     Heart sounds: Normal heart sounds, S1 normal and S2 normal.  Pulmonary:     Breath sounds: No decreased breath sounds, wheezing, rhonchi or rales.  Abdominal:     Palpations: Abdomen is soft.     Tenderness: There is no abdominal tenderness.  Musculoskeletal:     Right lower leg: Swelling present.     Left lower leg: No swelling.  Skin:    General: Skin is warm.  Findings: No rash.  Neurological:     Mental Status: She is alert and oriented to person, place, and time.      Condition at discharge: stable  The results of significant diagnostics from this hospitalization (including imaging, microbiology, ancillary and laboratory) are listed below for reference.   Imaging Studies: CT ABDOMEN PELVIS W CONTRAST Result Date: 01/09/2024 CLINICAL DATA:  Left lower quadrant pain.  Constipation. EXAM: CT ABDOMEN AND PELVIS WITH CONTRAST TECHNIQUE: Multidetector CT imaging of the abdomen and pelvis was performed using the standard protocol following bolus administration of intravenous contrast. RADIATION DOSE REDUCTION: This exam was performed according to the departmental dose-optimization program which includes automated exposure control, adjustment of the mA and/or kV according to patient size and/or use of iterative reconstruction technique. CONTRAST:  OMNIPAQUE  IOHEXOL  300 MG/ML  SOLN COMPARISON:  02/11/2023 FINDINGS: Lower Chest: No acute findings. Hepatobiliary: No suspicious hepatic masses identified. Prior  cholecystectomy. No evidence of biliary obstruction. Pancreas:  No mass or inflammatory changes. Spleen: Within normal limits in size and appearance. Adrenals/Urinary Tract: No suspicious masses identified. No evidence of ureteral calculi or hydronephrosis. Stomach/Bowel: Small hiatal hernia again noted. No evidence of obstruction, inflammatory process or abnormal fluid collections. Normal appendix visualized. Large amount of stool seen throughout the colon. Vascular/Lymphatic: No pathologically enlarged lymph nodes. No acute vascular findings. Reproductive: Prior hysterectomy noted. Adnexal regions are unremarkable in appearance. Other:  None. Musculoskeletal: No suspicious bone lesions identified. Right hip prosthesis noted. IMPRESSION: No acute findings. Large stool burden noted; recommend correlation for symptoms or signs of constipation. Small hiatal hernia. Electronically Signed   By: Norleen DELENA Kil M.D.   On: 01/09/2024 19:27   PERIPHERAL VASCULAR CATHETERIZATION Result Date: 01/02/2024 See surgical note for result.  VAS US  LOWER EXTREMITY VENOUS (DVT) Result Date: 12/21/2023  Lower Venous DVT Study Patient Name:  AALAYA YADAO  Date of Exam:   12/21/2023 Medical Rec #: [id number]       Accession #:    [id number] Date of Birth: 11/01/1961        Patient Gender: F Patient Age:   64 years Exam Location:  [facility] Procedure:      VAS US  LOWER EXTREMITY VENOUS (DVT) Referring Phys: SELINDA DEW --------------------------------------------------------------------------------  Indications: Pain, Swelling, and Edema. Other Indications: Right leg lymphedema. Risk Factors: History DVT History of right gastrocnemius thrombus Surgery IVC filter placed pre hip surgery 11/09/2023. Anticoagulation: Eliquis . Performing Technologist: Donnice Charnley RVT  Examination Guidelines: A complete evaluation includes B-mode imaging, spectral Doppler, color Doppler, and power Doppler as needed of all accessible portions  of each vessel. Bilateral testing is considered an integral part of a complete examination. Limited examinations for reoccurring indications may be performed as noted. The reflux portion of the exam is performed with the patient in reverse Trendelenburg.  +---------+---------------+---------+-----------+----------+--------------+ RIGHT    CompressibilityPhasicitySpontaneityPropertiesThrombus Aging +---------+---------------+---------+-----------+----------+--------------+ CFV      Full           Yes      Yes                                 +---------+---------------+---------+-----------+----------+--------------+ SFJ      Full                    Yes                                 +---------+---------------+---------+-----------+----------+--------------+  FV Prox  Full           Yes      Yes                                 +---------+---------------+---------+-----------+----------+--------------+ FV Mid   Full           Yes      Yes                                 +---------+---------------+---------+-----------+----------+--------------+ FV DistalFull           Yes      Yes                                 +---------+---------------+---------+-----------+----------+--------------+ PFV      Full                    Yes                                 +---------+---------------+---------+-----------+----------+--------------+ POP      Full           Yes      Yes                                 +---------+---------------+---------+-----------+----------+--------------+ PTV      Full                                                        +---------+---------------+---------+-----------+----------+--------------+ Gastroc  Full                                                        +---------+---------------+---------+-----------+----------+--------------+ GSV      Full                                                         +---------+---------------+---------+-----------+----------+--------------+ SSV      Full                                                        +---------+---------------+---------+-----------+----------+--------------+ Extensive edema seen in right calf. IVC filter identified, patent.  +----+---------------+---------+-----------+----------+--------------+ LEFTCompressibilityPhasicitySpontaneityPropertiesThrombus Aging +----+---------------+---------+-----------+----------+--------------+ CFV Full           Yes      Yes                                 +----+---------------+---------+-----------+----------+--------------+    Summary: RIGHT: -  There is no evidence of deep vein thrombosis in the lower extremity. - There is no evidence of superficial venous thrombosis.  - No cystic structure found in the popliteal fossa.  LEFT: - No evidence of common femoral vein obstruction.   *See table(s) above for measurements and observations. Electronically signed by Selinda Gu MD on 12/21/2023 at 4:25:50 PM.    Final     Microbiology: Results for orders placed or performed during the hospital encounter of 11/01/23  Surgical pcr screen     Status: None   Collection Time: 11/01/23 11:10 AM   Specimen: Nasal Mucosa; Nasal Swab  Result Value Ref Range Status   MRSA, PCR NEGATIVE NEGATIVE Final   Staphylococcus aureus NEGATIVE NEGATIVE Final    Comment: (NOTE) The Xpert SA Assay (FDA approved for NASAL specimens in patients 16 years of age and older), is one component of a comprehensive surveillance program. It is not intended to diagnose infection nor to guide or monitor treatment. Performed at [facility], [address] [place], [place] [address]     Labs: CBC: Recent Labs  Lab 01/09/24 1650  WBC 8.9  HGB 13.2  HCT 41.2  MCV 85.5  PLT 421*   Basic Metabolic Panel: Recent Labs  Lab 01/09/24 1650  NA 137  K 4.0  CL 102  CO2 23  GLUCOSE 108*  BUN 13  CREATININE  0.59  CALCIUM 9.3   Liver Function Tests: Recent Labs  Lab 01/09/24 1650  AST 18  ALT 11  ALKPHOS 66  BILITOT 0.5  PROT 7.9  ALBUMIN 4.0   CBG: No results for input(s): GLUCAP in the last 168 hours.  Discharge time spent: greater than 30 minutes.  Signed: Charlie Patterson, MD [facility] 01/10/2024

## 2024-01-10 NOTE — Assessment & Plan Note (Signed)
Chronic lymphedema right leg.

## 2024-01-10 NOTE — Assessment & Plan Note (Addendum)
Continue usual medications.

## 2024-01-10 NOTE — ED Notes (Signed)
Pt self medicated with 8mg  oral zofran from home at approx 1130 pm. Ordered zofran not given with morphine at this time.

## 2024-01-10 NOTE — Assessment & Plan Note (Signed)
Patient was able to tolerate some food and wanted to go home.  Has nausea medications at home.

## 2024-01-10 NOTE — Assessment & Plan Note (Addendum)
Patient was given mag citrate and and lactulose.  Patient had 2 large bowel movements.  She would feel more comfortable using the bathroom at home.  She has lactulose at home.  Recommended MiraLAX daily.

## 2024-01-10 NOTE — Hospital Course (Signed)
 Marland Kitchen

## 2024-01-10 NOTE — ED Notes (Signed)
Lab called to collect labs from this patient due this RN being unsuccessful in getting blood for labs. Lab notes putting patient on list and will collect labs around 0600 this am.

## 2024-01-10 NOTE — Assessment & Plan Note (Signed)
 Continue PPI ?

## 2024-01-10 NOTE — Hospital Course (Signed)
 63 y.o. female with medical history significant for ANA positive polyarthralgia, Raynaud's disease without gangrene, long-term use of immunosuppressant medication, hypertension, history of cholecystectomy, loop ileostomy, recurrent SBO who is being admitted for ileus after presenting to the ED for chief concerns of abdominal pain, nausea, vomiting.  She has been constipated and states her stool is in the form of pellets.  She has had no bowel movement in the past 24 hours she has been using lactulose , Linzess, Dulcolax, MiraLAX  under the guidance of her doctors without relief.  She is passing gas.  States her pain is severe. ED course: Tachycardic and hypertensive with otherwise normal vitals and labs unremarkable CT abdomen showing large stool burden but no acute findings.  2/4.  Patient had bowel movement with mag citrate and lactulose .  Since she was moved in the hallway and it is a long way to the bathroom patient would rather go home and use the bathroom.  Able to tolerate food.  Abdominal pain much improved.

## 2024-01-10 NOTE — ED Notes (Signed)
 This Registered Nurse (RN) assumed responsibility for the care of the assigned patient at 0407 on 01/10/24. All nursing tasks, documentation, and responsibilities prior to this time are not the responsibility of this RN. Any assessments, interventions, or documentation completed before this time are not within the scope of this RN's duties.  Effective 0407 on 01/10/24, this RN is now accountable for all aspects of patient care, including but not limited to assessments, interventions, documentation, medication administration, and care coordination. All future tasks and documentation will be managed and completed by this RN from this time until care handoff at 0700 on 01/10/24.

## 2024-01-10 NOTE — H&P (Signed)
 History and Physical    Patient: Elizabeth Martin DOB: 1961/06/18 DOA: 01/09/2024 DOS: the patient was seen and examined on 01/10/2024 PCP: Tobie Border, MD  Patient coming from: Home  Chief Complaint:  Chief Complaint  Patient presents with   Abdominal Pain    HPI: Elizabeth Martin is a 63 y.o. female with medical history significant for ANA positive polyarthralgia, Raynaud's disease without gangrene, long-term use of immunosuppressant medication, hypertension, history of cholecystectomy, loop ileostomy, recurrent SBO who is being admitted for ileus after presenting to the ED for chief concerns of abdominal pain, nausea, vomiting.  She has been constipated and states her stool is in the form of pellets.  She has had no bowel movement in the past 24 hours she has been using lactulose , Linzess, Dulcolax, MiraLAX  under the guidance of her doctors without relief.  She is passing gas.  States her pain is severe. ED course: Tachycardic and hypertensive with otherwise normal vitals and labs unremarkable CT abdomen showing large stool burden but no acute findings.due to history of recurrent SBO with similar presentation, observation requested     Past Medical History:  Diagnosis Date   Anxiety    Asthma    Autoimmune disease (HCC)    Cancer (HCC)    Endometrial lymph node removal (30)   Corneal abrasion, right 05/24/2022   Depression    GERD (gastroesophageal reflux disease)    H/O blood clots    upper rt groin and behind both knees   History of hiatal hernia    Hypertension    Lymphedema    right leg   Morphea scleroderma    PONV (postoperative nausea and vomiting)    states gets violently ill   Past Surgical History:  Procedure Laterality Date   BASAL CELL CARCINOMA EXCISION Left    CHOLECYSTECTOMY  2008   FLEXIBLE SIGMOIDOSCOPY N/A 05/19/2022   Procedure: FLEXIBLE SIGMOIDOSCOPY;  Surgeon: Toledo, Ladell POUR, MD;  Location: [facility];  Service: Gastroenterology;   Laterality: N/A;   HERNIA REPAIR  2004   ILEOSTOMY CLOSURE N/A 08/24/2022   Procedure: ILEOSTOMY TAKEDOWN, open loop with Arthea Platt, PA-C to assist;  Surgeon: Desiderio Schanz, MD;  Location: [facility];  Service: General;  Laterality: N/A;   IVC FILTER INSERTION N/A 11/09/2023   Procedure: IVC FILTER INSERTION;  Surgeon: Marea Selinda RAMAN, MD;  Location: [facility];  Service: Cardiovascular;  Laterality: N/A;   IVC FILTER REMOVAL N/A 01/02/2024   Procedure: IVC FILTER REMOVAL;  Surgeon: Marea Selinda RAMAN, MD;  Location: [facility];  Service: Cardiovascular;  Laterality: N/A;   PLANTAR FASCIECTOMY     ROBOTIC ASSISTED TOTAL HYSTERECTOMY WITH BILATERAL SALPINGO OOPHERECTOMY  02/14/2012   ROTATOR CUFF REPAIR Left 03/24/2022   TONSILLECTOMY     TOTAL HIP ARTHROPLASTY Right 11/14/2023   Procedure: TOTAL HIP ARTHROPLASTY - posterior;  Surgeon: Lorelle Arthea, MD;  Location: [facility];  Service: Orthopedics;  Laterality: Right;   XI ROBOTIC ASSISTED LOWER ANTERIOR RESECTION N/A 05/24/2022   Procedure: XI ROBOTIC ASSISTED LOWER ANTERIOR RESECTION;  Surgeon: Desiderio Schanz, MD;  Location: [facility];  Service: General;  Laterality: N/A;   Social History:  reports that she has never smoked. She has never used smokeless tobacco. She reports current alcohol use. She reports that she does not use drugs.  Allergies  Allergen Reactions   Carboplatin Anaphylaxis        Hydromorphone  Anaphylaxis    Stopped breathing  Respiratory arrest - due to dosage administered  per the patient.   Latex Hives, Itching and Rash   Nitrofurantoin Nausea And Vomiting   Silicone Itching and Rash   Other Nausea And Vomiting    Any codone medications Violently sick even with zofran  per patient report    Wheat Other (See Comments)    Body aches and swelling    Oxycodone  Nausea And Vomiting    Family History  Problem Relation Age of Onset   Mental illness Neg Hx     Prior to Admission medications    Medication Sig Start Date End Date Taking? Authorizing Provider  acetaminophen  (TYLENOL ) 500 MG tablet Take 2 tablets (1,000 mg total) by mouth every 8 (eight) hours. 11/15/23   Charlene Debby BROCKS, PA-C  albuterol  (VENTOLIN  HFA) 108 (90 Base) MCG/ACT inhaler Inhale 2 puffs into the lungs every 6 (six) hours as needed for shortness of breath or wheezing. 01/11/22   [provider]  Alum Hydroxide-Mag Carbonate (GAVISCON PO) Take 4 tablets by mouth daily as needed (upset stomach).    [provider]  apixaban  (ELIQUIS ) 2.5 MG TABS tablet Take 1 tablet (2.5 mg total) by mouth every 12 (twelve) hours. 11/15/23   Charlene Debby BROCKS, PA-C  buPROPion  (WELLBUTRIN  XL) 150 MG 24 hr tablet Take 1 tablet (150 mg total) by mouth in the morning. 12/20/23   Eappen, Saramma, MD  busPIRone  (BUSPAR ) 10 MG tablet Take 10 mg by mouth 2 (two) times daily.    [provider]  celecoxib  (CELEBREX ) 200 MG capsule Take 1 capsule (200 mg total) by mouth daily. 11/15/23   Charlene Debby BROCKS, PA-C  cetirizine (ZYRTEC) 10 MG tablet Take 10 mg by mouth daily as needed for allergies. 08/10/21   [provider]  CLOBETASOL PROPIONATE E 0.05 % emollient cream Apply topically. 07/22/23   [provider]  desonide (DESOWEN) 0.05 % cream Apply 1 Application topically 2 (two) times daily as needed (morphea flare). 01/31/23   [provider]  DULoxetine  (CYMBALTA ) 30 MG capsule Take 1 capsule (30 mg total) by mouth daily. 12/20/23   Eappen, Saramma, MD  famotidine -calcium carbonate-magnesium  hydroxide (PEPCID  COMPLETE) 10-800-165 MG chewable tablet Chew 1 tablet by mouth daily as needed (heartburn).    [provider]  folic acid (FOLVITE) 1 MG tablet Take 1 mg by mouth daily. 07/13/22   [provider]  hydrocortisone-pramoxine [facility]) 2.5-1 % rectal cream Place 1 Application rectally 3 (three) times daily as needed for hemorrhoids. 10/03/23   [provider]   hydroxychloroquine  (PLAQUENIL ) 200 MG tablet Take 200 mg by mouth 2 (two) times daily. 01/11/22   [provider]  lisinopril  (ZESTRIL ) 40 MG tablet Take 40 mg by mouth daily. 12/29/22   [provider]  methotrexate (RHEUMATREX) 2.5 MG tablet Take 3 tablets by mouth 2 (two) times a week. Take 7.5 mg on Mondays and Tuesdays 08/08/22   [provider]  Multiple Vitamin (MULTIVITAMIN) capsule Take 1 capsule by mouth daily.    [provider]  omeprazole (PRILOSEC) 40 MG capsule Take 40 mg by mouth daily. 12/29/22   [provider]  ondansetron  (ZOFRAN ) 4 MG tablet Take 1 tablet (4 mg total) by mouth every 6 (six) hours as needed for nausea. 11/15/23   Charlene Debby BROCKS, PA-C  ondansetron  (ZOFRAN -ODT) 8 MG disintegrating tablet Take 8 mg by mouth every 8 (eight) hours as needed for vomiting or nausea. 12/29/22   [provider]  traZODone  (DESYREL ) 50 MG tablet Take 1 tablet (50 mg total)  by mouth at bedtime as needed for sleep. 11/10/23   Eappen, Saramma, MD  triamcinolone (KENALOG) 0.025 % cream Apply 1 Application topically 2 (two) times daily as needed (morphea flare). 12/15/22   [provider]  trospium  (SANCTURA ) 20 MG tablet Take 1 tablet (20 mg total) by mouth 2 (two) times daily as needed (overactive bladder). 11/15/23   Twylla Glendia BROCKS, MD    Physical Exam: Vitals:   01/09/24 1640 01/09/24 1642 01/10/24 0038  BP: (!) 162/84  (!) 159/85  Pulse: (!) 115  99  Resp: 17  18  Temp: 98.6 F (37 C)    TempSrc: Oral    SpO2: 96%  97%  Weight:  68 kg   Height:  5' 7 (1.702 m)    Physical Exam Vitals and nursing note reviewed.  Constitutional:      General: She is not in acute distress. HENT:     Head: Normocephalic and atraumatic.  Cardiovascular:     Rate and Rhythm: Normal rate and regular rhythm.     Heart sounds: Normal heart sounds.  Pulmonary:     Effort: Pulmonary effort is normal.     Breath sounds: Normal breath  sounds.  Abdominal:     Palpations: Abdomen is soft.     Tenderness: There is abdominal tenderness in the periumbilical area.  Neurological:     Mental Status: Mental status is at baseline.     Labs on Admission: I have personally reviewed following labs and imaging studies  CBC: Recent Labs  Lab 01/09/24 1650  WBC 8.9  HGB 13.2  HCT 41.2  MCV 85.5  PLT 421*   Basic Metabolic Panel: Recent Labs  Lab 01/09/24 1650  NA 137  K 4.0  CL 102  CO2 23  GLUCOSE 108*  BUN 13  CREATININE 0.59  CALCIUM 9.3   GFR: Estimated Creatinine Clearance: 70.9 mL/min (by C-G formula based on SCr of 0.59 mg/dL). Liver Function Tests: Recent Labs  Lab 01/09/24 1650  AST 18  ALT 11  ALKPHOS 66  BILITOT 0.5  PROT 7.9  ALBUMIN 4.0   Recent Labs  Lab 01/09/24 1650  LIPASE 30   No results for input(s): AMMONIA in the last 168 hours. Coagulation Profile: No results for input(s): INR, PROTIME in the last 168 hours. Cardiac Enzymes: No results for input(s): CKTOTAL, CKMB, CKMBINDEX, TROPONINI in the last 168 hours. BNP (last 3 results) No results for input(s): PROBNP in the last 8760 hours. HbA1C: No results for input(s): HGBA1C in the last 72 hours. CBG: No results for input(s): GLUCAP in the last 168 hours. Lipid Profile: No results for input(s): CHOL, HDL, LDLCALC, TRIG, CHOLHDL, LDLDIRECT in the last 72 hours. Thyroid  Function Tests: No results for input(s): TSH, T4TOTAL, FREET4, T3FREE, THYROIDAB in the last 72 hours. Anemia Panel: No results for input(s): VITAMINB12, FOLATE, FERRITIN, TIBC, IRON, RETICCTPCT in the last 72 hours. Urine analysis:    Component Value Date/Time   COLORURINE YELLOW (A) 01/09/2024 1650   APPEARANCEUR CLEAR (A) 01/09/2024 1650   APPEARANCEUR Clear 08/12/2023 0942   LABSPEC 1.027 01/09/2024 1650   PHURINE 5.0 01/09/2024 1650   GLUCOSEU NEGATIVE 01/09/2024 1650   HGBUR SMALL (A) 01/09/2024  1650   BILIRUBINUR NEGATIVE 01/09/2024 1650   BILIRUBINUR Negative 08/12/2023 0942   KETONESUR NEGATIVE 01/09/2024 1650   PROTEINUR NEGATIVE 01/09/2024 1650   NITRITE NEGATIVE 01/09/2024 1650   LEUKOCYTESUR NEGATIVE 01/09/2024 1650    Radiological Exams on Admission: CT ABDOMEN PELVIS W  CONTRAST Result Date: 01/09/2024 CLINICAL DATA:  Left lower quadrant pain.  Constipation. EXAM: CT ABDOMEN AND PELVIS WITH CONTRAST TECHNIQUE: Multidetector CT imaging of the abdomen and pelvis was performed using the standard protocol following bolus administration of intravenous contrast. RADIATION DOSE REDUCTION: This exam was performed according to the departmental dose-optimization program which includes automated exposure control, adjustment of the mA and/or kV according to patient size and/or use of iterative reconstruction technique. CONTRAST:  OMNIPAQUE  IOHEXOL  300 MG/ML  SOLN COMPARISON:  02/11/2023 FINDINGS: Lower Chest: No acute findings. Hepatobiliary: No suspicious hepatic masses identified. Prior cholecystectomy. No evidence of biliary obstruction. Pancreas:  No mass or inflammatory changes. Spleen: Within normal limits in size and appearance. Adrenals/Urinary Tract: No suspicious masses identified. No evidence of ureteral calculi or hydronephrosis. Stomach/Bowel: Small hiatal hernia again noted. No evidence of obstruction, inflammatory process or abnormal fluid collections. Normal appendix visualized. Large amount of stool seen throughout the colon. Vascular/Lymphatic: No pathologically enlarged lymph nodes. No acute vascular findings. Reproductive: Prior hysterectomy noted. Adnexal regions are unremarkable in appearance. Other:  None. Musculoskeletal: No suspicious bone lesions identified. Right hip prosthesis noted. IMPRESSION: No acute findings. Large stool burden noted; recommend correlation for symptoms or signs of constipation. Small hiatal hernia. Electronically Signed   By: Norleen DELENA Kil M.D.    On: 01/09/2024 19:27     Data Reviewed: Relevant notes from primary care and specialist visits, past discharge summaries as available in EHR, including Care Everywhere. Prior diagnostic testing as pertinent to current admission diagnoses Updated medications and problem lists for reconciliation ED course, including vitals, labs, imaging, treatment and response to treatment Triage notes, nursing and pharmacy notes and ED provider's notes Notable results as noted in HPI   Assessment and Plan: Severe constipation, possible Ileus History of recurrent SBO Will try mag citrate Patient declines enema Surgical consult if worsening symptoms or concern for SBO Pain control, antiemetics  Essential hypertension Continue home meds   Gastroesophageal reflux disease Protonix    Polyarthralgia Continue routine outpatient rheumatology follow-up [name] 200 mg p.o. twice daily methotrexate 12.5 mg weekly and folic acid 1 mg daily have been held on admission     DVT prophylaxis: Lovenox   Consults: none  Advance Care Planning:   Code Status: Prior   Family Communication: none  Disposition Plan: Back to previous home environment  Severity of Illness: The appropriate patient status for this patient is OBSERVATION. Observation status is judged to be reasonable and necessary in order to provide the required intensity of service to ensure the patient's safety. The patient's presenting symptoms, physical exam findings, and initial radiographic and laboratory data in the context of their medical condition is felt to place them at decreased risk for further clinical deterioration. Furthermore, it is anticipated that the patient will be medically stable for discharge from the hospital within 2 midnights of admission.   Author: Delayne LULLA Solian, MD 01/10/2024 3:16 AM  For on call review [email or web address].

## 2024-01-11 ENCOUNTER — Ambulatory Visit: Admitting: Physical Therapy

## 2024-01-11 ENCOUNTER — Ambulatory Visit: Attending: Family Medicine | Admitting: Occupational Therapy

## 2024-01-11 ENCOUNTER — Encounter: Payer: Self-pay | Admitting: Occupational Therapy

## 2024-01-11 DIAGNOSIS — M25551 Pain in right hip: Secondary | ICD-10-CM | POA: Diagnosis present

## 2024-01-11 DIAGNOSIS — R262 Difficulty in walking, not elsewhere classified: Secondary | ICD-10-CM

## 2024-01-11 DIAGNOSIS — I890 Lymphedema, not elsewhere classified: Secondary | ICD-10-CM | POA: Diagnosis present

## 2024-01-11 DIAGNOSIS — M25651 Stiffness of right hip, not elsewhere classified: Secondary | ICD-10-CM | POA: Insufficient documentation

## 2024-01-11 NOTE — Therapy (Signed)
 OUTPATIENT PHYSICAL THERAPY LOWER EXTREMITY TREATMENT   Patient Name: Elizabeth Martin MRN: [id number] DOB:01-02-1961, 64 y.o., female Today's Date: 01/11/2024  END OF SESSION:  PT End of Session - 01/11/24 1354     Visit Number 2    Number of Visits 16    Date for PT Re-Evaluation 03/01/24    Progress Note Due on Visit 10    PT Start Time 1400    PT Stop Time 1439    PT Time Calculation (min) 39 min    Equipment Utilized During Treatment Gait belt    Activity Tolerance Patient tolerated treatment well    Behavior During Therapy WFL for tasks assessed/performed              Past Medical History:  Diagnosis Date   Anxiety    Asthma    Autoimmune disease (HCC)    Cancer (HCC)    Endometrial lymph node removal (30)   Corneal abrasion, right 05/24/2022   Depression    GERD (gastroesophageal reflux disease)    H/O blood clots    upper rt groin and behind both knees   History of hiatal hernia    Hypertension    Lymphedema    right leg   Morphea scleroderma    PONV (postoperative nausea and vomiting)    states gets violently ill   Past Surgical History:  Procedure Laterality Date   BASAL CELL CARCINOMA EXCISION Left    CHOLECYSTECTOMY  2008   FLEXIBLE SIGMOIDOSCOPY N/A 05/19/2022   Procedure: FLEXIBLE SIGMOIDOSCOPY;  Surgeon: Toledo, Ladell POUR, MD;  Location: [facility];  Service: Gastroenterology;  Laterality: N/A;   HERNIA REPAIR  2004   ILEOSTOMY CLOSURE N/A 08/24/2022   Procedure: ILEOSTOMY TAKEDOWN, open loop with Arthea Platt, PA-C to assist;  Surgeon: Desiderio Schanz, MD;  Location: [facility];  Service: General;  Laterality: N/A;   IVC FILTER INSERTION N/A 11/09/2023   Procedure: IVC FILTER INSERTION;  Surgeon: Marea Selinda RAMAN, MD;  Location: [facility];  Service: Cardiovascular;  Laterality: N/A;   IVC FILTER REMOVAL N/A 01/02/2024   Procedure: IVC FILTER REMOVAL;  Surgeon: Marea Selinda RAMAN, MD;  Location: [facility];  Service:  Cardiovascular;  Laterality: N/A;   PLANTAR FASCIECTOMY     ROBOTIC ASSISTED TOTAL HYSTERECTOMY WITH BILATERAL SALPINGO OOPHERECTOMY  02/14/2012   ROTATOR CUFF REPAIR Left 03/24/2022   TONSILLECTOMY     TOTAL HIP ARTHROPLASTY Right 11/14/2023   Procedure: TOTAL HIP ARTHROPLASTY - posterior;  Surgeon: Lorelle Arthea, MD;  Location: [facility];  Service: Orthopedics;  Laterality: Right;   XI ROBOTIC ASSISTED LOWER ANTERIOR RESECTION N/A 05/24/2022   Procedure: XI ROBOTIC ASSISTED LOWER ANTERIOR RESECTION;  Surgeon: Desiderio Schanz, MD;  Location: [facility];  Service: General;  Laterality: N/A;   Patient Active Problem List   Diagnosis Date Noted   Constipation 01/10/2024   Nausea & vomiting 01/10/2024   S/P total right hip arthroplasty 11/14/2023   Blood coagulation disorder (HCC) 11/10/2023   PTSD (post-traumatic stress disorder) 11/10/2023   Severe episode of recurrent major depressive disorder, without psychotic features (HCC) 11/10/2023   GAD (generalized anxiety disorder) 11/10/2023   High risk medication use 11/10/2023   Anxiety and depression 03/22/2023   Cervical radiculopathy 03/16/2023   Colon cancer screening 03/15/2023   Chronic idiopathic constipation 03/15/2023   Dyspnea 03/07/2023   [facility] spotted fever 03/07/2023   Strain of gastrocnemius tendon 03/07/2023   Lower abdominal pain 03/07/2023   Pain in pelvis  03/07/2023   Small bowel obstruction (HCC) 02/11/2023   Raynaud disease 02/11/2023   Polyarthralgia 02/11/2023   Edema 01/03/2023   Diverticular disease 12/10/2022   Hot flashes 12/10/2022   Pain of right lower extremity 12/10/2022   Paresthesia of lower extremity 12/10/2022   Primary insomnia 12/10/2022   Thyroid  nodule 12/10/2022   Multiple joint pain 12/10/2022   Basal cell carcinoma of skin 12/10/2022   Acute bilateral low back pain without sciatica 11/10/2022   Chronic lower back pain 09/05/2022   S/P closure of ileostomy 08/24/2022   Ileostomy in  [facility])    Morphea 07/13/2022   Colonic stricture (HCC) 05/19/2022   Large bowel obstruction (HCC) 05/19/2022   Hypokalemia 05/18/2022   Abdominal pain 05/18/2022   Osteoarthritis of left knee 02/24/2022   Mixed connective tissue disease (HCC) 02/17/2022   Adhesive capsulitis of left shoulder 01/27/2022   Glenohumeral arthritis, left 01/27/2022   Microscopic hematuria 01/12/2022   Otorrhagia of right ear 12/10/2021   Elevated testosterone level in female 11/04/2021   Anti-TPO antibodies present 10/20/2021   Rash 10/13/2021   Hashimoto thyroiditis, 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   Hashimoto's disease 03/23/2021   Exposure to severe acute respiratory syndrome coronavirus 2 (SARS-CoV-2) 12/19/2020   Migraine without aura and without status migrainosus, not intractable 08/19/2020   Myofascial pain 07/30/2020   Neck pain 07/30/2020   Upper back pain 07/30/2020   Cervical facet joint syndrome 07/30/2020   Hair loss 07/14/2020   Edema of lower extremity 07/11/2020   Adenomatous colon polyp 02/26/2020   Gastroesophageal reflux disease 02/15/2020   Diarrhea 02/15/2020   Sleep disorder 01/16/2020   Hyperlipidemia 07/11/2019   Right shoulder pain 04/04/2019   Pre-operative clearance 12/13/2018   Endometrial cancer (HCC) 04/05/2018   Patellofemoral pain syndrome of right knee 03/20/2018   Gastroenteritis 12/07/2017   Fatigue 11/16/2017   Morbid obesity (HCC) 08/11/2017   Stucco keratoses 08/11/2017   Essential hypertension 08/09/2017   History of endometrial cancer 08/09/2017   Lymphedema 08/09/2017   Menopause 08/09/2017   Malignant neoplastic disease (HCC) 04/07/2017   Cellulitis 09/18/2016   Benign hypertension 06/02/2016   Asthma 06/04/1969    PCP: Tobie Border MD   REFERRING PROVIDER: Tobie Border MD   REFERRING DIAG: s/p R hip replacement  THERAPY DIAG:  Pain in right hip  Stiffness of right hip, not elsewhere classified  Difficulty in walking, not elsewhere  classified  Rationale for Evaluation and Treatment: Rehabilitation  ONSET DATE: 11/14/23  SUBJECTIVE:   SUBJECTIVE STATEMENT: Patient reports she went to the ED a couple days ago due to some abdominal pain but she was released with no significant issues reports she is feeling a little better now.  Patient presents following her occupational therapy visit for her right lower extremity lymphedema management.  PERTINENT HISTORY: Pmh includes s/p R hip arthropaslasty 11/14/23, blood coagulation disorder, PTSD, anxiety, cervical radiculopathy dypsnea, polyarthralgia, Raynaud disease, s/p closure of ileostomy, Hashimoto thyroid , endometrial cancer, menopause, endometrial cancer, lymphedema. Patient had home health PT up until last week. Reports it is getting better.  PAIN:  Are you having pain? Yes: NPRS scale: 3 Pain location: back of hip Pain description: aching Aggravating factors: walk too much  Relieving factors: resting, laying on L side   Worst pain: 5/10 Least pain: 3/10  PRECAUTIONS: Other: recovering from hip sx  RED FLAGS: None   WEIGHT BEARING RESTRICTIONS: No  FALLS:  Has patient fallen in last 6 months? Yes. Number of falls 2  LIVING ENVIRONMENT:  Lives with: lives with their spouse Lives in: House/apartment Stairs: No Has following equipment at home: Single point cane  OCCUPATION: retired,   PLOF: Independent  PATIENT GOALS: return to hiking, working out, gardening.   NEXT MD VISIT: saw surgeon last week.   OBJECTIVE:  Note: Objective measures were completed at Evaluation unless otherwise noted.  DIAGNOSTIC FINDINGS:   IMPRESSION: Right hip replacement.  No visible complicating feature.  PATIENT SURVEYS:  LEFS 5/80  COGNITION: Overall cognitive status: Within functional limits for tasks assessed     SENSATION: WFL  EDEMA:  Currently being treated for lymphedema   MUSCLE LENGTH: Hamstrings: limited bilaterally with R>L   POSTURE: weight shift  left  PALPATION: Edema/lymphedema affecting RLE  LOWER EXTREMITY ROM:  Passive ROM Right eval Left eval  Hip flexion 48 flexion PROM supine   Hip extension    Hip abduction 5 degrees AROM    Hip adduction    Hip internal rotation    Hip external rotation    Knee flexion    Knee extension    Ankle dorsiflexion    Ankle plantarflexion    Ankle inversion    Ankle eversion     (Blank rows = not tested)  LOWER EXTREMITY MMT:  MMT Right eval Left eval  Hip flexion 2+ 5  Hip extension  5  Hip abduction 2+* painful 5  Hip adduction 2+ 5  Hip internal rotation    Hip external rotation    Knee flexion 3 5  Knee extension 3 5  Ankle dorsiflexion 3 5  Ankle plantarflexion 3 5  Ankle inversion    Ankle eversion     (Blank rows = not tested)  LOWER EXTREMITY SPECIAL TESTS:  N/a post surgical  FUNCTIONAL TESTS:  5 times sit to stand: 29 6 minute walk test: perform next session.  10 meter walk test: 15 seconds with SPC   GAIT: Distance walked: 60 ft Assistive device utilized: Single point cane Level of assistance: CGA Comments: antalgic gait pattern with decreased stance time RLE. Slight vaulting pattern.  Stair negotiation: -step to pattern lead with LLE, one hand on cane one hand on rail ascending. Descending step to pattern down with RLE.                                                                                                                               TREATMENT DATE: 01/11/24 Physical Performance  - with SPC x 430 ft with stop time at 5 min due to fatigue. A few self corrected losses of balance. Uses SPC throughout  TE- To improve strength, endurance, mobility, and function of specific targeted muscle groups or improve joint range of motion or improve muscle flexibility R Hip strength - standing hip abduction 2 x 01 with UE support  - standing hip extension 2 x 10  Transition to supine on the mat table perform 2 sets of 6 repetitions of heel slides  and  2 sets of of glutes and sets with 5-second holds.  Both of these were added to patient's home exercise program  Large portion of initial treatment time was used patient needed to use the bathroom and all nearby bathrooms were occupied to spend about 10 minutes ambulating around the hospital to find her stable bathroom.  Patient also came a few minutes late for occupational therapy.  PATIENT EDUCATION:  Education details: goals, POC, HEP Person educated: Patient Education method: Explanation, Demonstration, Tactile cues, and Verbal cues Education comprehension: verbalized understanding, returned demonstration, verbal cues required, tactile cues required, and needs further education  HOME EXERCISE PROGRAM: Access Code: [facility] URL: [email or web address] Date: 01/11/2024 Prepared by: Lonni Gainer  Exercises - Side Stepping with Counter Support  - 1 x daily - 7 x weekly - 2 sets - 10 reps - 5 hold - Standing Hip Extension  - 1 x daily - 7 x weekly - 2 sets - 10 reps - 5 hold - Standing March with Counter Support  - 1 x daily - 7 x weekly - 2 sets - 10 reps - 5 hold - Standing Hip Abduction with Counter Support  - 1 x daily - 7 x weekly - 2 sets - 10 reps - Seated Hamstring Stretch  - 1 x daily - 7 x weekly - 2 sets - 2 reps - 30 hold - Sit to Stand  - 1 x daily - 7 x weekly - 2 sets - 10 reps - 5 hold - Supine Heel Slide  - 1 x daily - 7 x weekly - 2 sets - 6 reps - Supine Gluteal Sets  - 1 x daily - 7 x weekly - 2 sets - 10 reps ASSESSMENT:  CLINICAL IMPRESSION: Patient presents with good motivation for completion of physical therapy activities.  Patient progressing with her home exercise program and was provided with a new handout.  Patient shows significant limitation in her ambulatory endurance for community based activities as evidenced by her results with 6-minute walk test. Pt will continue to benefit from skilled physical therapy intervention to address  impairments, improve QOL, and attain therapy goals.    OBJECTIVE IMPAIRMENTS: Abnormal gait, decreased activity tolerance, decreased balance, decreased coordination, decreased endurance, decreased mobility, difficulty walking, decreased ROM, decreased strength, hypomobility, increased edema, impaired perceived functional ability, impaired flexibility, improper body mechanics, postural dysfunction, and pain.   ACTIVITY LIMITATIONS: carrying, lifting, bending, sitting, standing, squatting, sleeping, stairs, transfers, bed mobility, bathing, toileting, dressing, reach over head, hygiene/grooming, locomotion level, and caring for others  PARTICIPATION LIMITATIONS: meal prep, cleaning, laundry, interpersonal relationship, driving, shopping, community activity, and yard work  PERSONAL FACTORS: Age, Past/current experiences, Time since onset of injury/illness/exacerbation, and 3+ comorbidities: blood coagulation disorder, PTSD, anxiety, cervical radiculopathy dypsnea, polyarthralgia, Raynaud disease, s/p closure of ileostomy, Hashimoto thyroid , endometrial cancer, menopause, endometrial cancer, lymphedema  are also affecting patient's functional outcome.   REHAB POTENTIAL: Good  CLINICAL DECISION MAKING: Evolving/moderate complexity  EVALUATION COMPLEXITY: Moderate   GOALS: Goals reviewed with patient? Yes  SHORT TERM GOALS: Target date: 01/19/2024   Patient will be independent in home exercise program to improve strength/mobility for better functional independence with ADLs. Baseline:1/30: HEP given Goal status: INITIAL    LONG TERM GOALS: Target date: 03/01/2024   Patient (> 73 years old) will complete five times sit to stand test in < 10 seconds indicating an increased LE strength and improved balance. Baseline: 1/30: 29 seconds Goal status: INITIAL  2.  Patient will increase six  minute walk test distance to >1000 for progression to community ambulator and improve gait  ability Baseline: 430 ft in 5 min and required seated rest to end test due to fatigue, uses SPC Goal status: INITIAL  3.  Patient will increase 10 meter walk test to >1.66m/s as to improve gait speed for better community ambulation and to reduce fall risk. Baseline: 1/30: 15 seconds with SPC  Goal status: INITIAL  4.  Patient will increase lower extremity functional scale to >60/80 to demonstrate improved functional mobility and increased tolerance with ADLs.  Baseline: 1/30: 5/80 Goal status: INITIAL     PLAN:  PT FREQUENCY: 2x/week  PT DURATION: 8 weeks  PLANNED INTERVENTIONS: [id number]- PT Re-evaluation, 97110-Therapeutic exercises, 97530- Therapeutic activity, 97112- Neuromuscular re-education, 97535- Self Care, [id number]- Manual therapy, [id number]- Gait training, [phone]- Splinting, 97014- Electrical stimulation (unattended), [phone]- Electrical stimulation (manual), [id number]- Ultrasound, [id number]- Traction (mechanical), Patient/Family education, Balance training, Stair training, Taping, Joint mobilization, Joint manipulation, Spinal manipulation, Spinal mobilization, Manual lymph drainage, Scar mobilization, Compression bandaging, Vestibular training, Visual/preceptual remediation/compensation, DME instructions, Cryotherapy, and Moist heat  PLAN FOR NEXT SESSION:  R hip strength and ROM    Lonni KATHEE Gainer, PT 01/11/2024, 1:55 PM

## 2024-01-12 NOTE — Therapy (Signed)
 OUTPATIENT OCCUPATIONAL THERAPY TREATMENT NOTE   LOWER EXTREMITY LYMPHEDEMA  Patient Name: Elizabeth Martin MRN: [id number] DOB:1961-05-21, 63 y.o., female Today's Date: 01/12/2024   END OF SESSION:  Lymphedema Episode 2   OT End of Session - 01/11/24 1314     Visit Number 11    Number of Visits 36    Date for OT Re-Evaluation 01/09/24    OT Start Time 0100    OT Stop Time 0205    OT Time Calculation (min) 65 min    Activity Tolerance Patient tolerated treatment well;No increased pain    Behavior During Therapy WFL for tasks assessed/performed              Past Medical History:  Diagnosis Date   Anxiety    Asthma    Autoimmune disease (HCC)    Cancer (HCC)    Endometrial lymph node removal (30)   Corneal abrasion, right 05/24/2022   Depression    GERD (gastroesophageal reflux disease)    H/O blood clots    upper rt groin and behind both knees   History of hiatal hernia    Hypertension    Lymphedema    right leg   Morphea scleroderma    PONV (postoperative nausea and vomiting)    states gets violently ill   Past Surgical History:  Procedure Laterality Date   BASAL CELL CARCINOMA EXCISION Left    CHOLECYSTECTOMY  2008   FLEXIBLE SIGMOIDOSCOPY N/A 05/19/2022   Procedure: FLEXIBLE SIGMOIDOSCOPY;  Surgeon: Toledo, Ladell POUR, MD;  Location: [facility];  Service: Gastroenterology;  Laterality: N/A;   HERNIA REPAIR  2004   ILEOSTOMY CLOSURE N/A 08/24/2022   Procedure: ILEOSTOMY TAKEDOWN, open loop with Arthea Platt, PA-C to assist;  Surgeon: Desiderio Schanz, MD;  Location: [facility];  Service: General;  Laterality: N/A;   IVC FILTER INSERTION N/A 11/09/2023   Procedure: IVC FILTER INSERTION;  Surgeon: Marea Selinda RAMAN, MD;  Location: [facility];  Service: Cardiovascular;  Laterality: N/A;   IVC FILTER REMOVAL N/A 01/02/2024   Procedure: IVC FILTER REMOVAL;  Surgeon: Marea Selinda RAMAN, MD;  Location: [facility];  Service: Cardiovascular;  Laterality:  N/A;   PLANTAR FASCIECTOMY     ROBOTIC ASSISTED TOTAL HYSTERECTOMY WITH BILATERAL SALPINGO OOPHERECTOMY  02/14/2012   ROTATOR CUFF REPAIR Left 03/24/2022   TONSILLECTOMY     TOTAL HIP ARTHROPLASTY Right 11/14/2023   Procedure: TOTAL HIP ARTHROPLASTY - posterior;  Surgeon: Lorelle Arthea, MD;  Location: [facility];  Service: Orthopedics;  Laterality: Right;   XI ROBOTIC ASSISTED LOWER ANTERIOR RESECTION N/A 05/24/2022   Procedure: XI ROBOTIC ASSISTED LOWER ANTERIOR RESECTION;  Surgeon: Desiderio Schanz, MD;  Location: [facility];  Service: General;  Laterality: N/A;   Patient Active Problem List   Diagnosis Date Noted   Constipation 01/10/2024   Nausea & vomiting 01/10/2024   S/P total right hip arthroplasty 11/14/2023   Blood coagulation disorder (HCC) 11/10/2023   PTSD (post-traumatic stress disorder) 11/10/2023   Severe episode of recurrent major depressive disorder, without psychotic features (HCC) 11/10/2023   GAD (generalized anxiety disorder) 11/10/2023   High risk medication use 11/10/2023   Anxiety and depression 03/22/2023   Cervical radiculopathy 03/16/2023   Colon cancer screening 03/15/2023   Chronic idiopathic constipation 03/15/2023   Dyspnea 03/07/2023   [facility] spotted fever 03/07/2023   Strain of gastrocnemius tendon 03/07/2023   Lower abdominal pain 03/07/2023   Pain in pelvis 03/07/2023   Small bowel obstruction (HCC)  02/11/2023   Raynaud disease 02/11/2023   Polyarthralgia 02/11/2023   Edema 01/03/2023   Diverticular disease 12/10/2022   Hot flashes 12/10/2022   Pain of right lower extremity 12/10/2022   Paresthesia of lower extremity 12/10/2022   Primary insomnia 12/10/2022   Thyroid  nodule 12/10/2022   Multiple joint pain 12/10/2022   Basal cell carcinoma of skin 12/10/2022   Acute bilateral low back pain without sciatica 11/10/2022   Chronic lower back pain 09/05/2022   S/P closure of ileostomy 08/24/2022   Ileostomy in place [facility])    Morphea  07/13/2022   Colonic stricture (HCC) 05/19/2022   Large bowel obstruction (HCC) 05/19/2022   Hypokalemia 05/18/2022   Abdominal pain 05/18/2022   Osteoarthritis of left knee 02/24/2022   Mixed connective tissue disease (HCC) 02/17/2022   Adhesive capsulitis of left shoulder 01/27/2022   Glenohumeral arthritis, left 01/27/2022   Microscopic hematuria 01/12/2022   Otorrhagia of right ear 12/10/2021   Elevated testosterone level in female 11/04/2021   Anti-TPO antibodies present 10/20/2021   Rash 10/13/2021   Hashimoto thyroiditis, 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   Hashimoto's disease 03/23/2021   Exposure to severe acute respiratory syndrome coronavirus 2 (SARS-CoV-2) 12/19/2020   Migraine without aura and without status migrainosus, not intractable 08/19/2020   Myofascial pain 07/30/2020   Neck pain 07/30/2020   Upper back pain 07/30/2020   Cervical facet joint syndrome 07/30/2020   Hair loss 07/14/2020   Edema of lower extremity 07/11/2020   Adenomatous colon polyp 02/26/2020   Gastroesophageal reflux disease 02/15/2020   Diarrhea 02/15/2020   Sleep disorder 01/16/2020   Hyperlipidemia 07/11/2019   Right shoulder pain 04/04/2019   Pre-operative clearance 12/13/2018   Endometrial cancer (HCC) 04/05/2018   Patellofemoral pain syndrome of right knee 03/20/2018   Gastroenteritis 12/07/2017   Fatigue 11/16/2017   Morbid obesity (HCC) 08/11/2017   Stucco keratoses 08/11/2017   Essential hypertension 08/09/2017   History of endometrial cancer 08/09/2017   Lymphedema 08/09/2017   Menopause 08/09/2017   Malignant neoplastic disease (HCC) 04/07/2017   Cellulitis 09/18/2016   Benign hypertension 06/02/2016   Asthma 06/04/1969    PCP: Glenwood Blanch, MD  REFERRING PROVIDER: Glenwood Blanch, MD  REFERRING DIAG: I89.0   THERAPY DIAG:  Lymphedema, not elsewhere classified  Rationale for Evaluation and Treatment: Rehabilitation  ONSET DATE: 2013  (Cancer-related, endometrial  2013)  SUBJECTIVE:                                                                                                                                                                                           SUBJECTIVE STATEMENT:Taira Bracknell presents to OT for lymphedema care  to her RLE/RLQ. Patient presents to s/p R hip arthroplasty 11/14/23. Pt reports she went to ed and was admitted for acute abdominal pain and vomiting. Given her history she was admitted. Tests revealed a large stool burden vs blockage or DVT. Pt states she is feeling much better today in general. Pt does not rate lymphedema related pain numerically. Se states her leg feels so heavy.  PERTINENT HISTORY: Asthma, Autoimmune Disease, Endometrial Ca w/ LND ( 30 bilateral pelvic and periaortic LN), adjuvant chemotherapy and XRT,   H/O blood clots, RLE/RLQ lymphedema, Robotic assisted total hysterectomy w/ bilateral oophorectomy L Rotator Cuff repair, S/P ileostomy 2/2 colon stricture 05/2022 R hip arthroplasty scheduled 11/11/23.   PAIN:  Are you having pain? RLE lymphedema, Yes: NPRS scale: R HIP not rated 3/10 Pain location: RLE, including hip Pain description: aching, heavy, full, tight Aggravating factors: standing, walking, extended dependent sitting Relieving factors: elevation, movement  PRECAUTIONS: Fall and Other: LYMPHEDEMA  WEIGHT BEARING RESTRICTIONS: Yes 5# lifting restriction- shoulder  FALLS:  Has patient fallen in last 6 months? 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: gardening, hiking, biking, dancing- unable to participate in any of these due to pain and swelling  HAND DOMINANCE: right   PRIOR LEVEL OF FUNCTION: Independent with basic ADLs, Independent with household mobility without device,  Independent with community mobility without device, Requires assistive device for independence, Needs assistance with homemaking, Needs assistance with transfers, and Leisure: decreased  social participation for leisure pursuits due to impaired mobility and pain  PATIENT GOALS:  Be able to move freely without pain Reduce limb volume to be able to lift limb for basic daily ADLs and functional ambulation Reduce limb volume to increase ability to perform lower body dressing and bathing and grooming Reduce limb to increase body image  OBJECTIVE: Note: Objective measures were completed at Evaluation unless otherwise noted.  COGNITION:  Overall cognitive status: Within functional limits for tasks assessed   OBSERVATIONS / OTHER ASSESSMENTS:   POSTURE: WFL  LE ROM: WFL, but Limited mildly at R knee and ankle due to girth, skin approximation, and joint pain  LE MMT: WFL  Mild, Stage  II, Bilateral Lower Extremity Lymphedema 2/2 CVI and Obesity  Skin  Description Hyper-Keratosis Peau d' Orange Shiny Tight Fibrotic/ Indurated Fatty Doughy Spongy/ boggy   x x x x R>L   x   Skin dry Flaky WNL Macerated   mildly sclerotic     Color Redness Varicosities Blanching Hemosiderin Stain Mottled    x x   x   Odor Malodorous Yeast Fungal infection  WNL      x   Temperature Warm Cool wnl    x     Pitting Edema   1+ 2+ 3+ 4+ Non-pitting         x   Girth Symmetrical Asymmetrical                   Distribution    R>L RLE toes to groin    Stemmer Sign Positive Negative   +    Lymphorrhea History Of:  Present Absent     x    Wounds History Of Present Absent Venous Arterial Pressure Sheer     x        Signs of Infection Redness  Warmth Erythema Acute Swelling Drainage Borders                    Sensation Light Touch Deep pressure Hypersensitivity   In tact Impaired In tact Impaired Absent Impaired   x  x  x     Nails WNL   Fungus nail dystrophy   x     Hair Growth  Symmetrical Asymmetrical    R>L   Skin Creases Base of toes  Ankles   Base of Fingers knees       Abdominal pannus Thigh Lobules  Face/neck   x x  x        BLE COMPARATIVE LIMB VOLUMETRICS 10/10/23  LANDMARK RIGHT  10/10/23  R LEG (A-D) 5466.5 ml  R THIGH (E-G) 9186.6 ml  R FULL LIMB (A-G) 14653.1 ml  Limb Volume differential (LVD)  LVD for LEG = 44.1%, R>L LVD for THIGH= 33.98%, R>L LVD for FULL lower extremity 37.8%, R>L  Volume change since last 08/11/22 R LEG is INCREASED in volume by 59.6%. L THIGH is INCREASED by 38 %, and RLE full limb is INCREASED by 45.38%.  Volume change overall V  (Blank rows = not tested)  LANDMARK LEFT  10/12/23  L LEG (A-D) 3053.6 ml  L THIGH (E-G) 6064.8 ml  L  FULL LIMB (A-G) 9118.4 ml  Limb Volume differential (LVD)  %  Volume change since initial %  Volume change overall %  (Blank rows = not tested)   10 th VISIT PROGRESS NOTE RLE COMPARATIVE LIMB VOLUMETRICS 01/04/24: Deferred until next visit by Pt request.  TBA next session  LANDMARK RIGHT    R LEG (A-D) TBA  R THIGH (E-G) TBA  R FULL LIMB (A-G) TBA  Limb Volume differential (LVD)    Volume change since last 08/11/22 TBA  Volume change overall TBA  (Blank rows = not tested)    CA-related LYMPHEDEMA Hx:  SURGERY TYPE/DATE: 2013 NUMBER OF LYMPH NODES REMOVED: 30 by report- bilateral pelvic and periaortic CHEMOTHERAPY: yes RADIATION:yes INFECTIONS: no hx cellulitis GAIT: Distance walked: >500 ft Assistive device utilized: single point cane Level of assistance: Modified independence- extra time Comments: R limp  LYMPHEDEMA LIFE IMPACT SCALE (LLIS): Intake 10/12/23 75% (The extent to which lymphedema related problems impacted your life over the past week)  FOTO (functional outcome measure):10/10/23 INTAKE: 36%  PATIENT EDUCATION:  Education details: Continued Pt/ CG edu for lymphedema self care home program throughout session. Topics include outcome of comparative limb  volumetrics- starting limb volume differentials (LVDs), technology and gradient techniques used for short stretch, multilayer compression wrapping, simple self-MLD, therapeutic lymphatic pumping exercises, skin/nail care, LE precautions,. compression garment recommendations and specifications, wear and care schedule and compression garment donning / doffing w assistive devices. Discussed progress towards all OT goals since commencing CDT. All questions answered to the Pt's satisfaction. Good return. Person educated: Patient Education method: Explanation, Demonstration, and Handouts Education comprehension: verbalized understanding, returned demonstration, and needs further education  LE SELF-CARE HOME  PROGRAM: Simple self-MLD/daily to affected quadrant and body part At least 2 x daily BLE Lymphatic Pumping There ex 1 set of 10 reps each, in order, bilaterally Daily skin care to affected body part to limit infection risk and increase skin excursion Compression Bandaging Intensive stage compression: multilayer short stretch wraps with gradient techniques. One limb at a time. Length patient dependent. Self-management Phase: Appropriate daytime compression garment and hours-of-sleep device  Compression garments: Custom-made gradient compression garments and hours-of-sleep devices  are medically necessary because they are uniquely sized and shaped to fit the exact dimensions of the affected extremities and to provide accurate and consistent gradient compression and containment, essential  for optimally managing chronic, progressive lymphedema. The convoluted HOS devices are medically necessary to facilitate increased lymphatic circulation and limit fibrosis formation when sleeping. Multiple custom compression garments are needed for optimal hygiene to limit infection risk. Custom compression garments should be replaced q 3-6 months When worn consistently for optimal lipo-lymphedema self-management over  time.  Pt will benefit from [id number]- Circaid reduction kit whole leg to increase independence with lymphedema self care, to  reduce limb volume  for body symmetry and balance, and to limit infection risk and progression.  ASSESSMENT:  CLINICAL IMPRESSION: Emphasis of visit on manual lymphatic drainage, including fibrosis techniques to leg and foot towards ipsilateral axillary lymph nodes, functional inguinal nodes,  and deep abdominal lymphatics.  Applied full limb  multilayer, gradient compression wraps  after manual therapy from base of toes to groin. Cont as per POC to assist with healing, to reduce limb swelling and progression of tissue hardening due to both lymphedema and auto immune issues, and to assist with normalizing functional ambulation and mobility. Priority is reducing swelling to enable Pt to resume wearing custom compression garments ASAP to reduce bulky wraps.   10/10/23 Lymphedema Episode 2, Initial OT Evaluation : Cumi Sanagustin is a 51 y a female presenting with chronic, progressive, moderate, Stage II, RLE/RLQ cancer -related lymphedema with onset 11 years ago after Rx for endometrial cancer. Pt is well known to this therapist as she has successfully undergone Complete Decongestive Therapy (CDT)  this clinic bin the past. Pt reports recent exacerbation of RLE swelling worsened as inflammation in L hip has worsened over time.  Pt has a hip replacement scheduled in late December here it [facility]. RE limb volumetrics today reveal that R LEG volume is dramatically increased by 59.65 since last visit. R LEG VOLUME is INCREASED in volume by 59.6%. L THIGH is INCREASED by 38 %, and RLE full limb is INCREASED by 45.38%. Pt presents with LE-related skin changes. Prolonged inflammation in the R hip joint has likely overloaded  already RLE/RLQ lymphatics.  RLE/RLQ lymphedema limits Pt's ability to perform basic and instrumental ADLs, including functional ambulation, mobility and transfers, grooming  , lower body bathing and dressing, skin inspection and skin care. Pt has difficulty  reaching her lower legs and feet to apply compression wraps and don/doff        compression stockings. LE limits ability to P[perform instrumental ADLs, including driving, yard work and home management activities. Lymphedema limits her ability to participate in leisure pursuits and productive activities, and it negatively impacts body image, life roles and quality of life.   Pt will benefit from an Intensive and follow along course of lymphedema. CDT will consist of Manual Lymphatic gainage   (MLD), skin care, therapeutic exercise and compression, wraps initially then garments. Pt will need assistance throughout CDT   for applying compression wraps due to limited hip AROM and decreased skin flexibility. Without skilled Occupational Therapy for lymphedema care, lymphedema will progress and further functional decline is expected.   In prep for upcoming hip replacement OT will educate Pt re assistive devices, including tub transfer bench and elevated toilet seat, in keeping with hip precautions.  OBJECTIVE IMPAIRMENTS: Abnormal gait, decreased activity tolerance, decreased balance, decreased knowledge of use of DME, decreased mobility, difficulty walking, decreased ROM, decreased strength, increased edema, decreased skin  flexibility, increased fascial restrictions, impaired sensation, pain, and chronic, progressive LLE/LLQ lymphatic swelling and associated pain.   ADL LIMITATIONS: carrying, lifting, bending, sitting, standing, squatting, sleeping, stairs, transfers, bed mobility, bathing, dressing, hygiene/grooming, and productive activities, leisure pursuits, social participation, body image  driving, shopping, housework, yard work, cooking, meal prep  PERSONAL FACTORS: Past/current experiences and 3+ comorbidities: Morphea Scleroderma, Mixed connective tissue disease, and osteoarthritis  are also affecting patient's  functional outcome.   REHAB POTENTIAL: Good  EVALUATION COMPLEXITY: Moderate  GOALS: Goals reviewed with patient? Yes  SHORT TERM GOALS: Target date: 4th OT Rx visit   Pt will demonstrate understanding of lymphedema precautions and prevention strategies with modified independence using a printed reference to identify at least 5 precautions and discussing how s/he may implement them into daily life to reduce risk of progression with extra time.  Baseline:Max A Goal status: GOAL MET  2.  Pt will be able to apply multilayer, knee length, gradient, compression wraps to one leg at a time with modified assistance (extra time and assistive device/s) to decrease limb volume, to limit infection risk, and to limit lymphedema progression.  Baseline: Dependent Goal status: GOAL MET  LONG TERM GOALS: Target date: 01/09/24 (12 weeks)  Given this patient's Intake score 36% on the functional outcomes FOTO tool, patient will experience an increase in function of 3 points to improve basic and instrumental ADLs performance, including lymphedema self-care.  Baseline: Max A Goal status: DEFERRED as LORALIE has been discontinued  2.  Given this patient's Intake score of  75% on the Lymphedema Life Impact Scale (LLIS), patient will experience a reduction of at least 5 points in her perceived level of functional impairment resulting from lymphedema to improve functional performance and quality of life (QOL). Baseline: % Goal status: PROGRESSING  3.  Pt will achieve at least a 10% volume reduction in full, RLE limb volume to return limb to typical size and shape, to limit infection risk and LE progression, to decrease pain, to improve function. Baseline: Dependent Goal status:PROGRESSING. Volumetrics deferred today by Pt request in order to increase time for manual therapy which provides pain relief. Volumetrics next visit. By visual assessment I would estimate ~10% volume reduction for entire RLE to DATE. tissue  DENSITY IN THIGH HAS REDUCED MILDLY AND SLIGHTLY IN LEG.   4.  Pt will obtain proper compression garments/devices and achieve modified independence (extra time + assistive devices) with donning/doffing to optimize limb volume reductions and limit LE  progression over time. Baseline:  Goal status: 01/04/24: PARTIALLY MET; In an effort to reduce burden of care on her spouse, Pt obtained adjustable, Velcro style R full leg, Circaid leg reduction kit for use s/p THA, but this proved not effective for controlling swelling, and were not easier to don and doff more independently. We've resumed wrapping until she is able to fit back into custom compression garments.  5.  During Intensive phase CDT , with modified independence, Pt will achieve at least 85% compliance with all lymphedema self-care home program components, including daily skin care, compression wraps and /or garments, simple self MLD and lymphatic pumping therex to habituate LE self care protocol  into ADLs for optimal LE self-management over time. Baseline: Dependent Goal status: ONGOING PLAN:  OT FREQUENCY: 2x/week  OT DURATION: 12 weeks and PRN  PLANNED INTERVENTIONS: Complete Decongestive Therapy (Intensive and supported Self-Management Phases), 97110-Therapeutic exercises, 97530- Therapeutic activity, 97535- Self Care, [id number]- Manual therapy, Patient/Family education, Taping, Manual lymph drainage, Scar mobilization, Compression bandaging,  DME instructions, and skin care to reduce infection risk throughout manual therapy. Fit with replacement compression garments ASAP  PLAN FOR NEXT SESSION:  Complete RLE comparative limb volumetrics for progress note update Cont RLE/RLQ MLD Cont multilayer compression wrapping    Zebedee Dec, MS, OTR/L, CLT-LANA 01/12/24 10:56 AM  01/12/2024, 10:56 AM

## 2024-01-16 ENCOUNTER — Encounter: Payer: BC Managed Care – PPO | Admitting: Occupational Therapy

## 2024-01-17 ENCOUNTER — Ambulatory Visit (INDEPENDENT_AMBULATORY_CARE_PROVIDER_SITE_OTHER): Payer: Self-pay | Admitting: Licensed Clinical Social Worker

## 2024-01-17 ENCOUNTER — Ambulatory Visit: Admitting: Occupational Therapy

## 2024-01-17 ENCOUNTER — Ambulatory Visit

## 2024-01-17 DIAGNOSIS — I890 Lymphedema, not elsewhere classified: Secondary | ICD-10-CM

## 2024-01-17 DIAGNOSIS — F332 Major depressive disorder, recurrent severe without psychotic features: Secondary | ICD-10-CM

## 2024-01-17 DIAGNOSIS — R262 Difficulty in walking, not elsewhere classified: Secondary | ICD-10-CM

## 2024-01-17 DIAGNOSIS — M25551 Pain in right hip: Secondary | ICD-10-CM

## 2024-01-17 DIAGNOSIS — F4310 Post-traumatic stress disorder, unspecified: Secondary | ICD-10-CM

## 2024-01-17 DIAGNOSIS — F411 Generalized anxiety disorder: Secondary | ICD-10-CM

## 2024-01-17 DIAGNOSIS — M25651 Stiffness of right hip, not elsewhere classified: Secondary | ICD-10-CM

## 2024-01-17 NOTE — Therapy (Deleted)
OUTPATIENT OCCUPATIONAL THERAPY LOWER EXTREMITY TREATMENT   Patient Name: Elizabeth Martin MRN: [id number] DOB:[birth year], 63 y.o., female Today's Date: 01/17/2024  END OF SESSION:      Past Medical History:  Diagnosis Date   Anxiety    Asthma    Autoimmune disease (HCC)    Cancer (HCC)    Endometrial lymph node removal (30)   Corneal abrasion, right 05/24/2022   Depression    GERD (gastroesophageal reflux disease)    H/O blood clots    upper rt groin and behind both knees   History of hiatal hernia    Hypertension    Lymphedema    right leg   Morphea scleroderma    PONV (postoperative nausea and vomiting)    states gets violently ill   Past Surgical History:  Procedure Laterality Date   BASAL CELL CARCINOMA EXCISION Left    CHOLECYSTECTOMY  2008   FLEXIBLE SIGMOIDOSCOPY N/A 05/19/2022   Procedure: FLEXIBLE SIGMOIDOSCOPY;  Surgeon: Toledo, Boykin Nearing, MD;  Location: [facility];  Service: Gastroenterology;  Laterality: N/A;   HERNIA REPAIR  2004   ILEOSTOMY CLOSURE N/A 08/24/2022   Procedure: ILEOSTOMY TAKEDOWN, open loop with Lynden Oxford, PA-C to assist;  Surgeon: Henrene Dodge, MD;  Location: [facility];  Service: General;  Laterality: N/A;   IVC FILTER INSERTION N/A 11/09/2023   Procedure: IVC FILTER INSERTION;  Surgeon: Annice Needy, MD;  Location: [facility];  Service: Cardiovascular;  Laterality: N/A;   IVC FILTER REMOVAL N/A 01/02/2024   Procedure: IVC FILTER REMOVAL;  Surgeon: Annice Needy, MD;  Location: [facility];  Service: Cardiovascular;  Laterality: N/A;   PLANTAR FASCIECTOMY     ROBOTIC ASSISTED TOTAL HYSTERECTOMY WITH BILATERAL SALPINGO OOPHERECTOMY  02/14/2012   ROTATOR CUFF REPAIR Left 03/24/2022   TONSILLECTOMY     TOTAL HIP ARTHROPLASTY Right 11/14/2023   Procedure: TOTAL HIP ARTHROPLASTY - posterior;  Surgeon: Reinaldo Berber, MD;  Location: [facility];  Service: Orthopedics;  Laterality: Right;   XI ROBOTIC ASSISTED LOWER  ANTERIOR RESECTION N/A 05/24/2022   Procedure: XI ROBOTIC ASSISTED LOWER ANTERIOR RESECTION;  Surgeon: Henrene Dodge, MD;  Location: [facility];  Service: General;  Laterality: N/A;   Patient Active Problem List   Diagnosis Date Noted   Constipation 01/10/2024   Nausea & vomiting 01/10/2024   S/P total right hip arthroplasty 11/14/2023   Blood coagulation disorder (HCC) 11/10/2023   PTSD (post-traumatic stress disorder) 11/10/2023   Severe episode of recurrent major depressive disorder, without psychotic features (HCC) 11/10/2023   GAD (generalized anxiety disorder) 11/10/2023   High risk medication use 11/10/2023   Anxiety and depression 03/22/2023   Cervical radiculopathy 03/16/2023   Colon cancer screening 03/15/2023   Chronic idiopathic constipation 03/15/2023   Dyspnea 03/07/2023   [place] spotted fever 03/07/2023   Strain of gastrocnemius tendon 03/07/2023   Lower abdominal pain 03/07/2023   Pain in pelvis 03/07/2023   Small bowel obstruction (HCC) 02/11/2023   Raynaud disease 02/11/2023   Polyarthralgia 02/11/2023   Edema 01/03/2023   Diverticular disease 12/10/2022   Hot flashes 12/10/2022   Pain of right lower extremity 12/10/2022   Paresthesia of lower extremity 12/10/2022   Primary insomnia 12/10/2022   Thyroid nodule 12/10/2022   Multiple joint pain 12/10/2022   Basal cell carcinoma of skin 12/10/2022   Acute bilateral low back pain without sciatica 11/10/2022   Chronic lower back pain 09/05/2022   S/P closure of ileostomy 08/24/2022   Ileostomy in  place [facility])    Morphea 07/13/2022   Colonic stricture (HCC) 05/19/2022   Large bowel obstruction (HCC) 05/19/2022   Hypokalemia 05/18/2022   Abdominal pain 05/18/2022   Osteoarthritis of left knee 02/24/2022   Mixed connective tissue disease (HCC) 02/17/2022   Adhesive capsulitis of left shoulder 01/27/2022   Glenohumeral arthritis, left 01/27/2022   Microscopic hematuria 01/12/2022   Otorrhagia of right ear  12/10/2021   Elevated testosterone level in female 11/04/2021   Anti-TPO antibodies present 10/20/2021   Rash 10/13/2021   Hashimoto thyroiditis, 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   Hashimoto's disease 03/23/2021   Exposure to severe acute respiratory syndrome coronavirus 2 (SARS-CoV-2) 12/19/2020   Migraine without aura and without status migrainosus, not intractable 08/19/2020   Myofascial pain 07/30/2020   Neck pain 07/30/2020   Upper back pain 07/30/2020   Cervical facet joint syndrome 07/30/2020   Hair loss 07/14/2020   Edema of lower extremity 07/11/2020   Adenomatous colon polyp 02/26/2020   Gastroesophageal reflux disease 02/15/2020   Diarrhea 02/15/2020   Sleep disorder 01/16/2020   Hyperlipidemia 07/11/2019   Right shoulder pain 04/04/2019   Pre-operative clearance 12/13/2018   Endometrial cancer (HCC) 04/05/2018   Patellofemoral pain syndrome of right knee 03/20/2018   Gastroenteritis 12/07/2017   Fatigue 11/16/2017   Morbid obesity (HCC) 08/11/2017   Stucco keratoses 08/11/2017   Essential hypertension 08/09/2017   History of endometrial cancer 08/09/2017   Lymphedema 08/09/2017   Menopause 08/09/2017   Malignant neoplastic disease (HCC) 04/07/2017   Cellulitis 09/18/2016   Benign hypertension 06/02/2016   Asthma 06/04/1969    PCP: Pennie Banter MD   REFERRING PROVIDER: Pennie Banter MD   REFERRING DIAG: s/p R hip replacement  THERAPY DIAG:  Lymphedema, not elsewhere classified  Rationale for Evaluation and Treatment: Rehabilitation  ONSET DATE: 11/14/23  SUBJECTIVE:   SUBJECTIVE STATEMENT: Pt  PRESENTS TO ot FOR LYMPHEDEMA CARE TO rle/rlq s/p THA on 11/14/23. Pt reports feeling better since successful treatment for severe constipation. Pt reports R hip pain at 4/10.  PERTINENT HISTORY: Pmh includes s/p R hip arthropaslasty 11/14/23, blood coagulation disorder, PTSD, anxiety, cervical radiculopathy dypsnea, polyarthralgia, Raynaud disease, s/p closure of  ileostomy, Hashimoto thyroid, endometrial cancer, menopause, endometrial cancer, lymphedema. Patient had home health PT up until last week. Reports it is getting better.  PAIN:  Are you having pain? Yes: NPRS scale: 4/10 Pain location: R hip Pain description: aching Aggravating factors: walk too much  Relieving factors: resting, laying on L side   PRECAUTIONS: Other: recovering from hip sx  RED FLAGS: None   WEIGHT BEARING RESTRICTIONS: No  FALLS:  Has patient fallen in last 6 months? Yes. Number of falls 2  LIVING ENVIRONMENT: Lives with: lives with their spouse Lives in: House/apartment Stairs: No Has following equipment at home: Single point cane  OCCUPATION: retired,   PLOF: Independent  PATIENT GOALS: return to hiking, working out, gardening.   NEXT MD VISIT: saw surgeon last week.   OBJECTIVE:  Note: Objective measures were completed at Evaluation unless otherwise noted.  DIAGNOSTIC FINDINGS:   IMPRESSION: Right hip replacement.  No visible complicating feature.  PATIENT SURVEYS:  LEFS 5/80  COGNITION: Overall cognitive status: Within functional limits for tasks assessed     SENSATION: WFL  EDEMA:  Currently being treated for lymphedema   MUSCLE LENGTH: Hamstrings: limited bilaterally with R>L   POSTURE: weight shift left  PALPATION: Edema/lymphedema affecting RLE  LOWER EXTREMITY ROM:  Passive ROM Right eval Left eval  Hip flexion  48 flexion PROM supine   Hip extension    Hip abduction 5 degrees AROM    Hip adduction    Hip internal rotation    Hip external rotation    Knee flexion    Knee extension    Ankle dorsiflexion    Ankle plantarflexion    Ankle inversion    Ankle eversion     (Blank rows = not tested)  LOWER EXTREMITY MMT:  MMT Right eval Left eval  Hip flexion 2+ 5  Hip extension  5  Hip abduction 2+* painful 5  Hip adduction 2+ 5  Hip internal rotation    Hip external rotation    Knee flexion 3 5  Knee  extension 3 5  Ankle dorsiflexion 3 5  Ankle plantarflexion 3 5  Ankle inversion    Ankle eversion     (Blank rows = not tested)  LOWER EXTREMITY SPECIAL TESTS:  N/a post surgical  FUNCTIONAL TESTS:  5 times sit to stand: 29 6 minute walk test: perform next session.  10 meter walk test: 15 seconds with SPC   GAIT: Distance walked: 60 ft Assistive device utilized: Single point cane Level of assistance: CGA Comments: antalgic gait pattern with decreased stance time RLE. Slight vaulting pattern.  Stair negotiation: -step to pattern lead with LLE, one hand on cane one hand on rail ascending. Descending step to pattern down with RLE.                                                                                                                               TREATMENT DATE: 01/17/24 Physical Performance  - with SPC x 430 ft with stop time at 5 min due to fatigue. A few self corrected losses of balance. Uses SPC throughout  TE- To improve strength, endurance, mobility, and function of specific targeted muscle groups or improve joint range of motion or improve muscle flexibility R Hip strength - standing hip abduction 2 x 01 with UE support  - standing hip extension 2 x 10  Transition to supine on the mat table perform 2 sets of 6 repetitions of heel slides and 2 sets of of glutes and sets with 5-second holds.  Both of these were added to patient's home exercise program  Large portion of initial treatment time was used patient needed to use the bathroom and all nearby bathrooms were occupied to spend about 10 minutes ambulating around the hospital to find her stable bathroom.  Patient also came a few minutes late for occupational therapy.  PATIENT EDUCATION:  Education details: goals, POC, HEP Person educated: Patient Education method: Explanation, Demonstration, Tactile cues, and Verbal cues Education comprehension: verbalized understanding, returned demonstration, verbal cues  required, tactile cues required, and needs further education  HOME EXERCISE PROGRAM: Access Code: [facility] URL: [email or web address] Date: 01/11/2024 Prepared by: Thresa Ross  Exercises - Side Stepping with Counter Support  - 1 x daily - 7 x weekly -  2 sets - 10 reps - 5 hold - Standing Hip Extension  - 1 x daily - 7 x weekly - 2 sets - 10 reps - 5 hold - Standing March with Counter Support  - 1 x daily - 7 x weekly - 2 sets - 10 reps - 5 hold - Standing Hip Abduction with Counter Support  - 1 x daily - 7 x weekly - 2 sets - 10 reps - Seated Hamstring Stretch  - 1 x daily - 7 x weekly - 2 sets - 2 reps - 30 hold - Sit to Stand  - 1 x daily - 7 x weekly - 2 sets - 10 reps - 5 hold - Supine Heel Slide  - 1 x daily - 7 x weekly - 2 sets - 6 reps - Supine Gluteal Sets  - 1 x daily - 7 x weekly - 2 sets - 10 reps ASSESSMENT:  CLINICAL IMPRESSION: Patient presents with good motivation for completion of physical therapy activities.  Patient progressing with her home exercise program and was provided with a new handout.  Patient shows significant limitation in her ambulatory endurance for community based activities as evidenced by her results with 6-minute walk test. Pt will continue to benefit from skilled physical therapy intervention to address impairments, improve QOL, and attain therapy goals.    OBJECTIVE IMPAIRMENTS: Abnormal gait, decreased activity tolerance, decreased balance, decreased coordination, decreased endurance, decreased mobility, difficulty walking, decreased ROM, decreased strength, hypomobility, increased edema, impaired perceived functional ability, impaired flexibility, improper body mechanics, postural dysfunction, and pain.   ACTIVITY LIMITATIONS: carrying, lifting, bending, sitting, standing, squatting, sleeping, stairs, transfers, bed mobility, bathing, toileting, dressing, reach over head, hygiene/grooming, locomotion level, and caring for  others  PARTICIPATION LIMITATIONS: meal prep, cleaning, laundry, interpersonal relationship, driving, shopping, community activity, and yard work  PERSONAL FACTORS: Age, Past/current experiences, Time since onset of injury/illness/exacerbation, and 3+ comorbidities: blood coagulation disorder, PTSD, anxiety, cervical radiculopathy dypsnea, polyarthralgia, Raynaud disease, s/p closure of ileostomy, Hashimoto thyroid, endometrial cancer, menopause, endometrial cancer, lymphedema  are also affecting patient's functional outcome.   REHAB POTENTIAL: Good  CLINICAL DECISION MAKING: Evolving/moderate complexity  EVALUATION COMPLEXITY: Moderate   GOALS: Goals reviewed with patient? Yes  SHORT TERM GOALS: Target date: 01/19/2024   Patient will be independent in home exercise program to improve strength/mobility for better functional independence with ADLs. Baseline:1/30: HEP given Goal status: INITIAL    LONG TERM GOALS: Target date: 03/01/2024   Patient (> 9 years old) will complete five times sit to stand test in < 10 seconds indicating an increased LE strength and improved balance. Baseline: 1/30: 29 seconds Goal status: INITIAL  2.  Patient will increase six minute walk test distance to >1000 for progression to community ambulator and improve gait ability Baseline: 430 ft in 5 min and required seated rest to end test due to fatigue, uses SPC Goal status: INITIAL  3.  Patient will increase 10 meter walk test to >1.20m/s as to improve gait speed for better community ambulation and to reduce fall risk. Baseline: 1/30: 15 seconds with SPC  Goal status: INITIAL  4.  Patient will increase lower extremity functional scale to >60/80 to demonstrate improved functional mobility and increased tolerance with ADLs.  Baseline: 1/30: 5/80 Goal status: INITIAL     PLAN:  PT FREQUENCY: 2x/week  PT DURATION: 8 weeks  PLANNED INTERVENTIONS: 97164- PT Re-evaluation, 97110-Therapeutic  exercises, 97530- Therapeutic activity, 97112- Neuromuscular re-education, 97535- Self Care, 21308- Manual therapy, 65784-  Gait training, 16109- Splinting, 60454- Electrical stimulation (unattended), [id number]- Electrical stimulation (manual), [id number]- Ultrasound, [id number]- Traction (mechanical), Patient/Family education, Balance training, Stair training, Taping, Joint mobilization, Joint manipulation, Spinal manipulation, Spinal mobilization, Manual lymph drainage, Scar mobilization, Compression bandaging, Vestibular training, Visual/preceptual remediation/compensation, DME instructions, Cryotherapy, and Moist heat  PLAN FOR NEXT SESSION:  R hip strength and ROM   Loel Dubonnet, MS, OTR/L, CLT-LANA 01/17/24 12:15 PM

## 2024-01-17 NOTE — Progress Notes (Signed)
THERAPIST PROGRESS NOTE Virtual Visit via Video Note  I connected with Donna Bernard on 01/17/24 at 11:00 AM EST by a video enabled telemedicine application and verified that I am speaking with the correct person using two identifiers.  Location: Patient: Address on file Provider: [facility]   I discussed the limitations of evaluation and management by telemedicine and the availability of in person appointments. The patient expressed understanding and agreed to proceed.  I discussed the assessment and treatment plan with the patient. The patient was provided an opportunity to ask questions and all were answered. The patient agreed with the plan and demonstrated an understanding of the instructions.   The patient was advised to call back or seek an in-person evaluation if the symptoms worsen or if the condition fails to improve as anticipated.  I provided 47 minutes of non-face-to-face time during this encounter.   Dereck Leep, LCSW   Session Time: 11-11:47am  Participation Level: Active  Behavioral Response: CasualAlertEuthymic  Type of Therapy: Individual Therapy  Treatment Goals addressed:  Goal: LTG: Elimination of maladaptive behaviors and thinking patterns which interfere with resolution of trauma as evidenced by self report     Dates: Start:  11/25/23    Expected End:  04/24/24       Disciplines: Interdisciplinary, PROVIDER             Goal: LTG: Develop and implement effective coping skills to carry out normal responsibilities and participate constructively in relationships as evidenced by self report     Dates: Start:  11/25/23    Expected End:  04/24/24       Disciplines: Interdisciplinary, PROVIDER             Goal: LTG: Recall traumatic events without becoming overwhelmed with negative emotions     Dates: Start:  11/25/23    Expected End:  04/24/24       Disciplines: Interdisciplinary, PROVIDER             Goal: LTG: Pt reports "think about these  things in the past without having such an impact on my life."        ProgressTowards Goals: Progressing  Interventions: Supportive and Other: EMDR  Summary: Elizabeth Martin is a 63 y.o. female who presents with symptoms of depression and trauma. Patient identifies symptoms to include reexperiencing, uncontrollable worry, tearfulness, avoidance, hypervigilance. Pt was oriented times 5. Pt was cooperative and engaged. Pt denies SI/HI/AVH.   Patient utilized therapeutic space to process recent EMDR session citing discovery of a new triggers/new memory.  Patient reflected on new memory and realizations.  Patient provided update on current health reflecting on recent medical scare.  Patient notes improvement with health is impacting improvements with mental health.  Patient continuing processing her target sequence plan through EMDR processing her touchstone memory.  Patient reports desensitization from 5-0.  Reinforced adaptive belief "I am enough ."  Suicidal/Homicidal: Nowithout intent/plan  Therapist Response: Clinician used active and supportive reflection to create a safe environment for patient to process recent life stressors.  Clinician assessed for current symptoms, stressors, safety since last session.  Clinician continued EMDR by working with patient in processing memories on her target sequence plan.     Plan: Return again in 1 week.  Diagnosis: PTSD (post-traumatic stress disorder)  Severe episode of recurrent major depressive disorder, without psychotic features (HCC)  GAD (generalized anxiety disorder)   Collaboration of Care: AEB psychiatrist can access notes and cln. Will review psychiatrists' notes. Check  in with the patient and will see LCSW per availability. Patient agreed with treatment recommendations.  Patient/Guardian was advised Release of Information must be obtained prior to any record release in order to collaborate their care with an outside provider.  Patient/Guardian was advised if they have not already done so to contact the registration department to sign all necessary forms in order for [place] to release information regarding their care.   Consent: Patient/Guardian gives verbal consent for treatment and assignment of benefits for services provided during this visit. Patient/Guardian expressed understanding and agreed to proceed.   Dereck Leep, LCSW 01/17/2024

## 2024-01-17 NOTE — Therapy (Signed)
OUTPATIENT OCCUPATIONAL THERAPY TREATMENT NOTE   LOWER EXTREMITY LYMPHEDEMA  Patient Name: Elizabeth Martin MRN: [id number] DOB:[birth year], 63 y.o., female Today's Date: 01/17/2024   END OF SESSION:  Lymphedema Episode 2   OT End of Session - 01/17/24 0811     Visit Number 12    Number of Visits 36    Date for OT Re-Evaluation 04/16/24    OT Start Time 0803    OT Stop Time 0905    OT Time Calculation (min) 62 min    Activity Tolerance Patient tolerated treatment well   L hip pain with bed  mobility and repositioning   Behavior During Therapy [facility] for tasks assessed/performed              Past Medical History:  Diagnosis Date   Anxiety    Asthma    Autoimmune disease (HCC)    Cancer (HCC)    Endometrial lymph node removal (30)   Corneal abrasion, right 05/24/2022   Depression    GERD (gastroesophageal reflux disease)    H/O blood clots    upper rt groin and behind both knees   History of hiatal hernia    Hypertension    Lymphedema    right leg   Morphea scleroderma    PONV (postoperative nausea and vomiting)    states gets violently ill   Past Surgical History:  Procedure Laterality Date   BASAL CELL CARCINOMA EXCISION Left    CHOLECYSTECTOMY  2008   FLEXIBLE SIGMOIDOSCOPY N/A 05/19/2022   Procedure: FLEXIBLE SIGMOIDOSCOPY;  Surgeon: Toledo, Boykin Nearing, MD;  Location: [facility] ENDOSCOPY;  Service: Gastroenterology;  Laterality: N/A;   HERNIA REPAIR  2004   ILEOSTOMY CLOSURE N/A 08/24/2022   Procedure: ILEOSTOMY TAKEDOWN, open loop with Lynden Oxford, PA-C to assist;  Surgeon: Henrene Dodge, MD;  Location: [facility] ORS;  Service: General;  Laterality: N/A;   IVC FILTER INSERTION N/A 11/09/2023   Procedure: IVC FILTER INSERTION;  Surgeon: Annice Needy, MD;  Location: [facility] INVASIVE CV LAB;  Service: Cardiovascular;  Laterality: N/A;   IVC FILTER REMOVAL N/A 01/02/2024   Procedure: IVC FILTER REMOVAL;  Surgeon: Annice Needy, MD;  Location: [facility] IN[facility] CV [facility];   Service: Cardiovascular;  Laterality: N/A;   PLANTAR FASCIECTOMY     ROBOTIC ASSISTED TOTAL HYSTERECTOMY WITH BILATERAL SALPINGO OOPHERECTOMY  02/14/2012   ROTATOR CUFF REPAIR Left 03/24/2022   TONSILLECTOMY     TOTAL HIP ARTHROPLASTY Right 11/14/2023   Procedure: TOTAL HIP ARTHROPLASTY - posterior;  Surgeon: Reinaldo Berber, MD;  Location: [facility] ORS;  Service: Orthopedics;  Laterality: Right;   XI ROBOTIC ASSISTED LOWER ANTERIOR RESECTION N/A 05/24/2022   Procedure: XI ROBOTIC ASSISTED LOWER ANTERIOR RESECTION;  Surgeon: Henrene Dodge, MD;  Location: [facility] ORS;  Service: General;  Laterality: N/A;   Patient Active Problem List   Diagnosis Date Noted   Constipation 01/10/2024   Nausea & vomiting 01/10/2024   S/P total right hip arthroplasty 11/14/2023   Blood coagulation disorder (HCC) 11/10/2023   PTSD (post-traumatic stress disorder) 11/10/2023   Severe episode of recurrent major depressive disorder, without psychotic features (HCC) 11/10/2023   GAD (generalized anxiety disorder) 11/10/2023   High risk medication use 11/10/2023   Anxiety and depression 03/22/2023   Cervical radiculopathy 03/16/2023   Colon cancer screening 03/15/2023   Chronic idiopathic constipation 03/15/2023   Dyspnea 03/07/2023   [facility] spotted fever 03/07/2023   Strain of gastrocnemius tendon 03/07/2023   Lower abdominal pain 03/07/2023   Pain in  pelvis 03/07/2023   Small bowel obstruction (HCC) 02/11/2023   Raynaud disease 02/11/2023   Polyarthralgia 02/11/2023   Edema 01/03/2023   Diverticular disease 12/10/2022   Hot flashes 12/10/2022   Pain of right lower extremity 12/10/2022   Paresthesia of lower extremity 12/10/2022   Primary insomnia 12/10/2022   Thyroid nodule 12/10/2022   Multiple joint pain 12/10/2022   Basal cell carcinoma of skin 12/10/2022   Acute bilateral low back pain without sciatica 11/10/2022   Chronic lower back pain 09/05/2022   S/P closure of ileostomy 08/24/2022    Ileostomy in place [facility])    Morphea 07/13/2022   Colonic stricture (HCC) 05/19/2022   Large bowel obstruction (HCC) 05/19/2022   Hypokalemia 05/18/2022   Abdominal pain 05/18/2022   Osteoarthritis of left knee 02/24/2022   Mixed connective tissue disease (HCC) 02/17/2022   Adhesive capsulitis of left shoulder 01/27/2022   Glenohumeral arthritis, left 01/27/2022   Microscopic hematuria 01/12/2022   Otorrhagia of right ear 12/10/2021   Elevated testosterone level in female 11/04/2021   Anti-TPO antibodies present 10/20/2021   Rash 10/13/2021   Hashimoto thyroiditis, 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   Hashimoto's disease 03/23/2021   Exposure to severe acute respiratory syndrome coronavirus 2 (SARS-CoV-2) 12/19/2020   Migraine without aura and without status migrainosus, not intractable 08/19/2020   Myofascial pain 07/30/2020   Neck pain 07/30/2020   Upper back pain 07/30/2020   Cervical facet joint syndrome 07/30/2020   Hair loss 07/14/2020   Edema of lower extremity 07/11/2020   Adenomatous colon polyp 02/26/2020   Gastroesophageal reflux disease 02/15/2020   Diarrhea 02/15/2020   Sleep disorder 01/16/2020   Hyperlipidemia 07/11/2019   Right shoulder pain 04/04/2019   Pre-operative clearance 12/13/2018   Endometrial cancer (HCC) 04/05/2018   Patellofemoral pain syndrome of right knee 03/20/2018   Gastroenteritis 12/07/2017   Fatigue 11/16/2017   Morbid obesity (HCC) 08/11/2017   Stucco keratoses 08/11/2017   Essential hypertension 08/09/2017   History of endometrial cancer 08/09/2017   Lymphedema 08/09/2017   Menopause 08/09/2017   Malignant neoplastic disease (HCC) 04/07/2017   Cellulitis 09/18/2016   Benign hypertension 06/02/2016   Asthma 06/04/1969    PCP: Pennie Banter, MD  REFERRING PROVIDER: Pennie Banter, MD  REFERRING DIAG: I89.0   THERAPY DIAG:  Lymphedema, not elsewhere classified  Rationale for Evaluation and Treatment: Rehabilitation  ONSET DATE: 2013   (Cancer-related, endometrial 2013)  SUBJECTIVE:                                                                                                                                                                                           SUBJECTIVE  STATEMENT:Erla Pates presents to OT for lymphedema care to her RLE/RLQ. Patient presents to s/p R hip arthroplasty 11/14/23. Pt reports she went to ed and was admitted for acute abdominal pain and vomiting.Tests revealed a large stool burden vs blockage or DVT. Pt states she is feeling much better today in general. Pt does not rate lymphedema related pain numerically. She rates R hip pain as 4/10.  PERTINENT HISTORY: Asthma, Autoimmune Disease, Endometrial Ca w/ LND ( 30 bilateral pelvic and periaortic LN), adjuvant chemotherapy and XRT,   H/O blood clots, RLE/RLQ lymphedema, Robotic assisted total hysterectomy w/ bilateral oophorectomy L Rotator Cuff repair, S/P ileostomy 2/2 colon stricture 05/2022 R hip arthroplasty scheduled 11/11/23.   PAIN:  Are you having pain? RLE lymphedema, Yes, not rated: NPRS scale: R HIP  4/10 Pain location: RLE, including hip Pain description: aching, heavy, full, tight Aggravating factors: standing, walking, extended dependent sitting Relieving factors: elevation, movement  PRECAUTIONS: Fall and Other: LYMPHEDEMA  WEIGHT BEARING RESTRICTIONS: Yes 5# lifting restriction- shoulder  FALLS:  Has patient fallen in last 6 months? No  LIVING ENVIRONMENT: Lives with: lives with their spouse Lives in: House/apartment Stairs: No;  Has following equipment at home: Single point cane, Environmental consultant - 2 wheeled, Environmental consultant - 4 wheeled, and Wheelchair (manual)  OCCUPATION: retired Control and instrumentation engineer: gardening, hiking, biking, dancing- unable to participate in any of these due to pain and swelling  HAND DOMINANCE: right   PRIOR LEVEL OF FUNCTION: Independent with basic ADLs, Independent with household mobility without device,  Independent with community mobility without device, Requires assistive device for independence, Needs assistance with homemaking, Needs assistance with transfers, and Leisure: decreased  social participation for leisure pursuits due to impaired mobility and pain  PATIENT GOALS:  Be able to move freely without pain Reduce limb volume to be able to lift limb for basic daily ADLs and functional ambulation Reduce limb volume to increase ability to perform lower body dressing and bathing and grooming Reduce limb to increase body image  OBJECTIVE: Note: Objective measures were completed at Evaluation unless otherwise noted.  COGNITION:  Overall cognitive status: Within functional limits for tasks assessed   OBSERVATIONS / OTHER ASSESSMENTS:   POSTURE: WFL  LE ROM: WFL, but Limited mildly at R knee and ankle due to girth, skin approximation, and joint pain  LE MMT: WFL  Mild, Stage  II, Bilateral Lower Extremity Lymphedema 2/2 CVI and Obesity  Skin  Description Hyper-Keratosis Peau d' Orange Shiny Tight Fibrotic/ Indurated Fatty Doughy Spongy/ boggy   x x x x R>L   x   Skin dry Flaky WNL Macerated   mildly sclerotic     Color Redness Varicosities Blanching Hemosiderin Stain Mottled    x x   x   Odor Malodorous Yeast Fungal infection  WNL      x   Temperature Warm Cool wnl    x     Pitting Edema   1+ 2+ 3+ 4+ Non-pitting         x   Girth Symmetrical Asymmetrical                   Distribution    R>L RLE toes to groin    Stemmer Sign Positive Negative   +    Lymphorrhea History Of:  Present Absent     x    Wounds History Of Present Absent Venous Arterial Pressure Sheer     x        Signs of  Infection Redness Warmth Erythema Acute Swelling Drainage Borders                    Sensation Light Touch Deep pressure Hypersensitivity   In tact Impaired In tact Impaired Absent Impaired   x  x  x     Nails WNL   Fungus nail dystrophy   x     Hair Growth  Symmetrical Asymmetrical    R>L   Skin Creases Base of toes  Ankles   Base of Fingers knees       Abdominal pannus Thigh Lobules  Face/neck   x x  x        BLE COMPARATIVE LIMB VOLUMETRICS 10/10/23  LANDMARK RIGHT  10/10/23  R LEG (A-D) 5466.5 ml  R THIGH (E-G) 9186.6 ml  R FULL LIMB (A-G) 14653.1 ml  Limb Volume differential (LVD)  LVD for LEG = 44.1%, R>L LVD for THIGH= 33.98%, R>L LVD for FULL lower extremity 37.8%, R>L  Volume change since last 08/11/22 R LEG is INCREASED in volume by 59.6%. L THIGH is INCREASED by 38 %, and RLE full limb is INCREASED by 45.38%.  Volume change overall V  (Blank rows = not tested)  LANDMARK LEFT  10/12/23  L LEG (A-D) 3053.6 ml  L THIGH (E-G) 6064.8 ml  L  FULL LIMB (A-G) 9118.4 ml  Limb Volume differential (LVD)  %  Volume change since initial %  Volume change overall %  (Blank rows = not tested)   10 th VISIT PROGRESS NOTE RLE COMPARATIVE LIMB VOLUMETRICS 01/04/24: Deferred until next visit by Pt request.  TBA next session  LANDMARK RIGHT    R LEG (A-D) TBA  R THIGH (E-G) TBA  R FULL LIMB (A-G) TBA  Limb Volume differential (LVD)    Volume change since last 08/11/22 TBA  Volume change overall TBA  (Blank rows = not tested)    CA-related LYMPHEDEMA Hx:  SURGERY TYPE/DATE: 2013 NUMBER OF LYMPH NODES REMOVED: 30 by report- bilateral pelvic and periaortic CHEMOTHERAPY: yes RADIATION:yes INFECTIONS: no hx cellulitis GAIT: Distance walked: >500 ft Assistive device utilized: single point cane Level of assistance: Modified independence- extra time Comments: R limp  LYMPHEDEMA LIFE IMPACT SCALE (LLIS): Intake 10/12/23 75% (The extent to which lymphedema related problems impacted your life over the past week)  FOTO (functional outcome measure):10/10/23 INTAKE: 36%  PATIENT EDUCATION:  Education details: Continued Pt/ CG edu for lymphedema self care home program throughout session. Topics include outcome of comparative limb  volumetrics- starting limb volume differentials (LVDs), technology and gradient techniques used for short stretch, multilayer compression wrapping, simple self-MLD, therapeutic lymphatic pumping exercises, skin/nail care, LE precautions,. compression garment recommendations and specifications, wear and care schedule and compression garment donning / doffing w assistive devices. Discussed progress towards all OT goals since commencing CDT. All questions answered to the Pt's satisfaction. Good return. Person educated: Patient Education method: Explanation, Demonstration, and Handouts Education comprehension: verbalized understanding, returned demonstration, and needs further education  LE SELF-CARE HOME  PROGRAM: Simple self-MLD/daily to affected quadrant and body part At least 2 x daily BLE Lymphatic Pumping There ex 1 set of 10 reps each, in order, bilaterally Daily skin care to affected body part to limit infection risk and increase skin excursion Compression Bandaging Intensive stage compression: multilayer short stretch wraps with gradient techniques. One limb at a time. Length patient dependent. Self-management Phase: Appropriate daytime compression garment and hours-of-sleep device  Compression garments: Custom-made gradient compression garments and  hours-of-sleep devices are medically necessary because they are uniquely sized and shaped to fit the exact dimensions of the affected extremities and to provide accurate and consistent gradient compression and containment, essential  for optimally managing chronic, progressive lymphedema. The convoluted HOS devices are medically necessary to facilitate increased lymphatic circulation and limit fibrosis formation when sleeping. Multiple custom compression garments are needed for optimal hygiene to limit infection risk. Custom compression garments should be replaced q 3-6 months When worn consistently for optimal lipo-lymphedema self-management over  time.  Pt will benefit from [id number]- Circaid reduction kit whole leg to increase independence with lymphedema self care, to  reduce limb volume  for body symmetry and balance, and to limit infection risk and progression.  ASSESSMENT:  CLINICAL IMPRESSION: Emphasis of visit on manual lymphatic drainage in supine and side lying, including fibrosis techniques to leg and foot towards ipsilateral axillary lymph nodes, functional inguinal nodes,  and deep abdominal lymphatics.  Applied full limb  multilayer, gradient compression wraps  after manual therapy from base of toes to groin. Cont as per POC to assist with healing, to reduce limb swelling and progression of tissue hardening due to both lymphedema and auto immune issues, and to assist with normalizing functional ambulation and mobility. Priority is reducing swelling to enable Pt to resume wearing custom compression garments ASAP to reduce bulky wraps.   10/10/23 Lymphedema Episode 2, Initial OT Evaluation : Lynelle Weiler is a 73 y a female presenting with chronic, progressive, moderate, Stage II, RLE/RLQ cancer -related lymphedema with onset 11 years ago after Rx for endometrial cancer. Pt is well known to this therapist as she has successfully undergone Complete Decongestive Therapy (CDT)  this clinic bin the past. Pt reports recent exacerbation of RLE swelling worsened as inflammation in L hip has worsened over time.  Pt has a hip replacement scheduled in late December here it [facility]. RE limb volumetrics today reveal that R LEG volume is dramatically increased by 59.65 since last visit. R LEG VOLUME is INCREASED in volume by 59.6%. L THIGH is INCREASED by 38 %, and RLE full limb is INCREASED by 45.38%. Pt presents with LE-related skin changes. Prolonged inflammation in the R hip joint has likely overloaded  already RLE/RLQ lymphatics.  RLE/RLQ lymphedema limits Pt's ability to perform basic and instrumental ADLs, including functional ambulation, mobility  and transfers, grooming , lower body bathing and dressing, skin inspection and skin care. Pt has difficulty  reaching her lower legs and feet to apply compression wraps and don/doff        compression stockings. LE limits ability to P[perform instrumental ADLs, including driving, yard work and home management activities. Lymphedema limits her ability to participate in leisure pursuits and productive activities, and it negatively impacts body image, life roles and quality of life.   Pt will benefit from an Intensive and follow along course of lymphedema. CDT will consist of Manual Lymphatic gainage   (MLD), skin care, therapeutic exercise and compression, wraps initially then garments. Pt will need assistance throughout CDT   for applying compression wraps due to limited hip AROM and decreased skin flexibility. Without skilled Occupational Therapy for lymphedema care, lymphedema will progress and further functional decline is expected.   In prep for upcoming hip replacement OT will educate Pt re assistive devices, including tub transfer bench and elevated toilet seat, in keeping with hip precautions.  OBJECTIVE IMPAIRMENTS: Abnormal gait, decreased activity tolerance, decreased balance, decreased knowledge of use of DME, decreased mobility, difficulty walking, decreased  ROM, decreased strength, increased edema, decreased skin flexibility, increased fascial restrictions, impaired sensation, pain, and chronic, progressive LLE/LLQ lymphatic swelling and associated pain.   ADL LIMITATIONS: carrying, lifting, bending, sitting, standing, squatting, sleeping, stairs, transfers, bed mobility, bathing, dressing, hygiene/grooming, and productive activities, leisure pursuits, social participation, body image  driving, shopping, housework, yard work, cooking, meal prep  PERSONAL FACTORS: Past/current experiences and 3+ comorbidities: Morphea Scleroderma, Mixed connective tissue disease, and osteoarthritis  are also  affecting patient's functional outcome.   REHAB POTENTIAL: Good  EVALUATION COMPLEXITY: Moderate  GOALS: Goals reviewed with patient? Yes  SHORT TERM GOALS: Target date: 4th OT Rx visit   Pt will demonstrate understanding of lymphedema precautions and prevention strategies with modified independence using a printed reference to identify at least 5 precautions and discussing how s/he may implement them into daily life to reduce risk of progression with extra time.  Baseline:Max A Goal status: GOAL MET  2.  Pt will be able to apply multilayer, knee length, gradient, compression wraps to one leg at a time with modified assistance (extra time and assistive device/s) to decrease limb volume, to limit infection risk, and to limit lymphedema progression.  Baseline: Dependent Goal status: GOAL MET  LONG TERM GOALS: Target date: 01/09/24 (12 weeks)  Given this patient's Intake score 36% on the functional outcomes FOTO tool, patient will experience an increase in function of 3 points to improve basic and instrumental ADLs performance, including lymphedema self-care.  Baseline: Max A Goal status: DEFERRED as Janyth Contes has been discontinued  2.  Given this patient's Intake score of  75% on the Lymphedema Life Impact Scale (LLIS), patient will experience a reduction of at least 5 points in her perceived level of functional impairment resulting from lymphedema to improve functional performance and quality of life (QOL). Baseline: % Goal status: PROGRESSING  3.  Pt will achieve at least a 10% volume reduction in full, RLE limb volume to return limb to typical size and shape, to limit infection risk and LE progression, to decrease pain, to improve function. Baseline: Dependent Goal status:PROGRESSING. Volumetrics deferred today by Pt request in order to increase time for manual therapy which provides pain relief. Volumetrics next visit. By visual assessment I would estimate ~10% volume reduction for entire  RLE to DATE. tissue DENSITY IN THIGH HAS REDUCED MILDLY AND SLIGHTLY IN LEG.   4.  Pt will obtain proper compression garments/devices and achieve modified independence (extra time + assistive devices) with donning/doffing to optimize limb volume reductions and limit LE  progression over time. Baseline:  Goal status: 01/04/24: PARTIALLY MET; In an effort to reduce burden of care on her spouse, Pt obtained adjustable, Velcro style R full leg, Circaid leg reduction kit for use s/p THA, but this proved not effective for controlling swelling, and were not easier to don and doff more independently. We've resumed wrapping until she is able to fit back into custom compression garments.  5.  During Intensive phase CDT , with modified independence, Pt will achieve at least 85% compliance with all lymphedema self-care home program components, including daily skin care, compression wraps and /or garments, simple self MLD and lymphatic pumping therex to habituate LE self care protocol  into ADLs for optimal LE self-management over time. Baseline: Dependent Goal status: ONGOING PLAN:  OT FREQUENCY: 2x/week  OT DURATION: 12 weeks and PRN  PLANNED INTERVENTIONS: Complete Decongestive Therapy (Intensive and supported Self-Management Phases), 97110-Therapeutic exercises, 97530- Therapeutic activity, 97535- Self Care, 16109- Manual therapy, Patient/Family education, Taping,  Manual lymph drainage, Scar mobilization, Compression bandaging, DME instructions, and skin care to reduce infection risk throughout manual therapy. Fit with replacement compression garments ASAP  PLAN FOR NEXT SESSION:  Complete RLE comparative limb volumetrics for progress note update Cont RLE/RLQ MLD Cont multilayer compression wrapping    Loel Dubonnet, MS, OTR/L, CLT-LANA 01/17/24 12:16 PM  01/17/2024, 12:16 PM

## 2024-01-17 NOTE — Therapy (Signed)
OUTPATIENT PHYSICAL THERAPY TREATMENT   Patient Name: Elizabeth Martin MRN: [id number] DOB:03/17/1961, 63 y.o., female Today's Date: 01/17/2024  END OF SESSION:  PT End of Session - 01/17/24 4098     Visit Number 3    Number of Visits 16    Date for PT Re-Evaluation 03/01/24    Authorization Type [employer]    Progress Note Due on Visit 10    PT Start Time 0915    PT Stop Time 1000    PT Time Calculation (min) 45 min    Equipment Utilized During Treatment Gait belt    Activity Tolerance Patient tolerated treatment well;No increased pain    Behavior During Therapy WFL for tasks assessed/performed              Past Medical History:  Diagnosis Date   Anxiety    Asthma    Autoimmune disease (HCC)    Cancer (HCC)    Endometrial lymph node removal (30)   Corneal abrasion, right 05/24/2022   Depression    GERD (gastroesophageal reflux disease)    H/O blood clots    upper rt groin and behind both knees   History of hiatal hernia    Hypertension    Lymphedema    right leg   Morphea scleroderma    PONV (postoperative nausea and vomiting)    states gets violently ill   Past Surgical History:  Procedure Laterality Date   BASAL CELL CARCINOMA EXCISION Left    CHOLECYSTECTOMY  2008   FLEXIBLE SIGMOIDOSCOPY N/A 05/19/2022   Procedure: FLEXIBLE SIGMOIDOSCOPY;  Surgeon: Toledo, Boykin Nearing, MD;  Location: [facility];  Service: Gastroenterology;  Laterality: N/A;   HERNIA REPAIR  2004   ILEOSTOMY CLOSURE N/A 08/24/2022   Procedure: ILEOSTOMY TAKEDOWN, open loop with Lynden Oxford, PA-C to assist;  Surgeon: Henrene Dodge, MD;  Location: [facility] ORS;  Service: General;  Laterality: N/A;   IVC FILTER INSERTION N/A 11/09/2023   Procedure: IVC FILTER INSERTION;  Surgeon: Annice Needy, MD;  Location: [facility] IN[facility];  Service: Cardiovascular;  Laterality: N/A;   IVC FILTER REMOVAL N/A 01/02/2024   Procedure: IVC FILTER REMOVAL;  Surgeon: Annice Needy, MD;  Location: [facility]  INVASIVE CV LAB;  Service: Cardiovascular;  Laterality: N/A;   PLANTAR FASCIECTOMY     ROBOTIC ASSISTED TOTAL HYSTERECTOMY WITH BILATERAL SALPINGO OOPHERECTOMY  02/14/2012   ROTATOR CUFF REPAIR Left 03/24/2022   TONSILLECTOMY     TOTAL HIP ARTHROPLASTY Right 11/14/2023   Procedure: TOTAL HIP ARTHROPLASTY - posterior;  Surgeon: Reinaldo Berber, MD;  Location: [facility] ORS;  Service: Orthopedics;  Laterality: Right;   XI ROBOTIC ASSISTED LOWER ANTERIOR RESECTION N/A 05/24/2022   Procedure: XI ROBOTIC ASSISTED LOWER ANTERIOR RESECTION;  Surgeon: Henrene Dodge, MD;  Location: [facility] ORS;  Service: General;  Laterality: N/A;   Patient Active Problem List   Diagnosis Date Noted   Constipation 01/10/2024   Nausea & vomiting 01/10/2024   S/P total right hip arthroplasty 11/14/2023   Blood coagulation disorder (HCC) 11/10/2023   PTSD (post-traumatic stress disorder) 11/10/2023   Severe episode of recurrent major depressive disorder, without psychotic features (HCC) 11/10/2023   GAD (generalized anxiety disorder) 11/10/2023   High Martin medication use 11/10/2023   Anxiety and depression 03/22/2023   Cervical radiculopathy 03/16/2023   Colon cancer screening 03/15/2023   Chronic idiopathic constipation 03/15/2023   Dyspnea 03/07/2023   [facility] spotted fever 03/07/2023   Strain of gastrocnemius tendon 03/07/2023   Lower abdominal pain  03/07/2023   Pain in pelvis 03/07/2023   Small bowel obstruction (HCC) 02/11/2023   Raynaud disease 02/11/2023   Polyarthralgia 02/11/2023   Edema 01/03/2023   Diverticular disease 12/10/2022   Hot flashes 12/10/2022   Pain of right lower extremity 12/10/2022   Paresthesia of lower extremity 12/10/2022   Primary insomnia 12/10/2022   Thyroid nodule 12/10/2022   Multiple joint pain 12/10/2022   Basal cell carcinoma of skin 12/10/2022   Acute bilateral low back pain without sciatica 11/10/2022   Chronic lower back pain 09/05/2022   S/P closure of ileostomy  08/24/2022   Ileostomy in place [facility])    Morphea 07/13/2022   Colonic stricture (HCC) 05/19/2022   Large bowel obstruction (HCC) 05/19/2022   Hypokalemia 05/18/2022   Abdominal pain 05/18/2022   Osteoarthritis of left knee 02/24/2022   Mixed connective tissue disease (HCC) 02/17/2022   Adhesive capsulitis of left shoulder 01/27/2022   Glenohumeral arthritis, left 01/27/2022   Microscopic hematuria 01/12/2022   Otorrhagia of right ear 12/10/2021   Elevated testosterone level in female 11/04/2021   Anti-TPO antibodies present 10/20/2021   Rash 10/13/2021   Hashimoto thyroiditis, 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   Hashimoto's disease 03/23/2021   Exposure to severe acute respiratory syndrome coronavirus 2 (SARS-CoV-2) 12/19/2020   Migraine without aura and without status migrainosus, not intractable 08/19/2020   Myofascial pain 07/30/2020   Neck pain 07/30/2020   Upper back pain 07/30/2020   Cervical facet joint syndrome 07/30/2020   Hair loss 07/14/2020   Edema of lower extremity 07/11/2020   Adenomatous colon polyp 02/26/2020   Gastroesophageal reflux disease 02/15/2020   Diarrhea 02/15/2020   Sleep disorder 01/16/2020   Hyperlipidemia 07/11/2019   Right shoulder pain 04/04/2019   Pre-operative clearance 12/13/2018   Endometrial cancer (HCC) 04/05/2018   Patellofemoral pain syndrome of right knee 03/20/2018   Gastroenteritis 12/07/2017   Fatigue 11/16/2017   Morbid obesity (HCC) 08/11/2017   Stucco keratoses 08/11/2017   Essential hypertension 08/09/2017   History of endometrial cancer 08/09/2017   Lymphedema 08/09/2017   Menopause 08/09/2017   Malignant neoplastic disease (HCC) 04/07/2017   Cellulitis 09/18/2016   Benign hypertension 06/02/2016   Asthma 06/04/1969    PCP: Pennie Banter MD   REFERRING PROVIDER: Pennie Banter MD   REFERRING DIAG: s/p R hip replacement  THERAPY DIAG:  Pain in right hip  Stiffness of right hip, not elsewhere classified  Difficulty  in walking, not elsewhere classified  Lymphedema, not elsewhere classified  Rationale for Evaluation and Treatment: Rehabilitation  ONSET DATE: 11/14/23  SUBJECTIVE:   SUBJECTIVE STATEMENT: Patient reports she went to the ED a couple days ago due to some abdominal pain but she was released with no significant issues reports she is feeling a little better now.  Patient presents following her occupational therapy visit for her right lower extremity lymphedema management.  PERTINENT HISTORY: s/p R hip arthropaslasty 11/14/23 with Dr. Audelia Acton posterior approach, blood coagulation disorder, PTSD, anxiety, cervical radiculopathy dypsnea, polyarthralgia, Raynaud disease, s/p closure of ileostomy, Hashimoto thyroid, endometrial cancer, menopause, endometrial cancer, lymphedema. Patient had home health PT prior to PT here.   PAIN:  Are you having pain?  4/10 Rt hip joint pain, posterior portion of hip.    PRECAUTIONS: Other: recovering from hip sx  RED FLAGS: None   WEIGHT BEARING RESTRICTIONS: No  FALLS:  Has patient fallen in last 6 months? Yes. Number of falls 2  LIVING ENVIRONMENT: Lives with: lives with their spouse Lives in: House/apartment Stairs: No  Has following equipment at home: Single point cane  OCCUPATION: retired,   PLOF: Independent  PATIENT GOALS: return to hiking, working out, gardening.   NEXT MD VISIT: March 2025   OBJECTIVE:  Note: Objective measures were completed at Evaluation unless otherwise noted.  DIAGNOSTIC FINDINGS:   IMPRESSION: Right hip replacement.  No visible complicating feature.  PATIENT SURVEYS:  LEFS 5/80  COGNITION: Overall cognitive status: Within functional limits for tasks assessed    MUSCLE LENGTH: Hamstrings: limited bilaterally with R>L   LOWER EXTREMITY ROM:  Passive ROM Right eval Left eval  Hip flexion 48 flexion PROM supine   Hip extension    Hip abduction 5 degrees AROM     (Blank rows = not tested)  LOWER  EXTREMITY MMT:  MMT Right eval Left eval  Hip flexion 2+ 5  Hip extension  5  Hip abduction 2+* painful 5  Hip adduction 2+ 5  Hip internal rotation    Hip external rotation    Knee flexion 3 5  Knee extension 3 5  Ankle dorsiflexion 3 5  Ankle plantarflexion 3 5   (Blank rows = not tested)    FUNCTIONAL TESTS:  5 times sit to stand: 29 6 minute walk test:  10 meter walk test: 15 seconds with SPC   GAIT: Distance walked: 60 ft Assistive device utilized: Single point cane Level of assistance: CGA Comments: antalgic gait pattern with decreased stance time RLE. Slight vaulting pattern.  Stair negotiation: -step to pattern lead with LLE, one hand on cane one hand on rail ascending. Descending step to pattern down with RLE.                                                                                                                               TREATMENT DATE: 01/17/24  -seated LAQ 1x12 (0lb), BlueTB loop Ankle PF x15, red TB loop knee flexion 1x12, 2.5lb AW ankle DF 1x15  -seated LAQ 1x15 (0lb), BlueTB loop Ankle PF x15, red TB loop knee flexion 1x12, 2.5lb AW ankle DF 1x15  -seated LAQ 1x13 (0lb), BlueTB loop Ankle PF x15, red TB loop knee flexion 1x12, 2.5lb AW ankle DF 1x15   -STS from 21" surface hands free x7 (fatigue/pain), peanut squeeze BLE 10x3secH, hip ABDCT yellowTB x10, Rt hip flexion from 22" height to ~100 degrees x10 c modA from author -STS from 22.5" surface hands free x10 (fatigue/pain), peanut squeeze BLE 10x3secH, hip ABDCT yellowTB x10, Rt hip flexion from 22" height to ~100 degrees x10 c moidA from author -STS from 22.5" surface hands free x7 (fatigue/pain), peanut squeeze BLE 10x3secH, hip ABDCT yellowTB x10, Rt hip flexion from 22" height to ~100 degrees x10 c modA from [employer]   PATIENT EDUCATION:  Education details: goals, POC, HEP Person educated: Patient Education method: Programmer, multimedia, Demonstration, Tactile cues, and Verbal cues Education  comprehension: verbalized understanding, returned demonstration, verbal cues required, tactile cues required, and needs further education  HOME EXERCISE PROGRAM: Access Code: [facility] URL: [email or web address] Date: 01/11/2024 Prepared by: Thresa Ross  Exercises - Side Stepping with Counter Support  - 1 x daily - 7 x weekly - 2 sets - 10 reps - 5 hold - Standing Hip Extension  - 1 x daily - 7 x weekly - 2 sets - 10 reps - 5 hold - Standing March with Counter Support  - 1 x daily - 7 x weekly - 2 sets - 10 reps - 5 hold - Standing Hip Abduction with Counter Support  - 1 x daily - 7 x weekly - 2 sets - 10 reps - Seated Hamstring Stretch  - 1 x daily - 7 x weekly - 2 sets - 2 reps - 30 hold - Sit to Stand  - 1 x daily - 7 x weekly - 2 sets - 10 reps - 5 hold - Supine Heel Slide  - 1 x daily - 7 x weekly - 2 sets - 6 reps - Supine Gluteal Sets  - 1 x daily - 7 x weekly - 2 sets - 10 reps  ASSESSMENT: CLINICAL IMPRESSION: Working ROM and single joint strength- Rt ankle, knee, and hip. Rt hip flexion requires assistance from author. Pt gives good feedback on effort and pain response. Pain stable throughout session. Dialing in resistance to 55-70% 1RM range. Pt will continue to benefit from skilled physical therapy intervention to address impairments, improve QOL, and attain therapy goals.    OBJECTIVE IMPAIRMENTS: Abnormal gait, decreased activity tolerance, decreased balance, decreased coordination, decreased endurance, decreased mobility, difficulty walking, decreased ROM, decreased strength, hypomobility, increased edema, impaired perceived functional ability, impaired flexibility, improper body mechanics, postural dysfunction, and pain.   ACTIVITY LIMITATIONS: carrying, lifting, bending, sitting, standing, squatting, sleeping, stairs, transfers, bed mobility, bathing, toileting, dressing, reach over head, hygiene/grooming, locomotion level, and caring for  others  PARTICIPATION LIMITATIONS: meal prep, cleaning, laundry, interpersonal relationship, driving, shopping, community activity, and yard work  PERSONAL FACTORS: Age, Past/current experiences, Time since onset of injury/illness/exacerbation, and 3+ comorbidities: blood coagulation disorder, PTSD, anxiety, cervical radiculopathy dypsnea, polyarthralgia, Raynaud disease, s/p closure of ileostomy, Hashimoto thyroid, endometrial cancer, menopause, endometrial cancer, lymphedema  are also affecting patient's functional outcome.   REHAB POTENTIAL: Good  CLINICAL DECISION MAKING: Evolving/moderate complexity  EVALUATION COMPLEXITY: Moderate   GOALS: Goals reviewed with patient? Yes  SHORT TERM GOALS: Target date: 01/19/2024  Patient will be independent in home exercise program to improve strength/mobility for better functional independence with ADLs. Baseline:1/30: HEP given Goal status: INITIAL  LONG TERM GOALS: Target date: 03/01/2024  Patient (> 76 years old) will complete five times sit to stand test in < 10 seconds indicating an increased LE strength and improved balance. Baseline: 1/30: 29 seconds Goal status: INITIAL  2.  Patient will increase six minute walk test distance to >1000 for progression to community ambulator and improve gait ability Baseline: 430 ft in 5 min and required seated rest to end test due to fatigue, uses SPC Goal status: INITIAL  3.  Patient will increase 10 meter walk test to >1.78m/s as to improve gait speed for better community ambulation and to reduce fall Martin. Baseline: 1/30: 15 seconds with SPC  Goal status: INITIAL  4.  Patient will increase lower extremity functional scale to >60/80 to demonstrate improved functional mobility and increased tolerance with ADLs.  Baseline: 1/30: 5/80 Goal status: INITIAL  PLAN:  PT FREQUENCY: 2x/week PT DURATION: 8 weeks PLANNED INTERVENTIONS: 97164- PT Re-evaluation, 97110-Therapeutic exercises, 97530-  Therapeutic activity, [id number]- Neuromuscular re-education, [phone]- Self Care, 10932- Manual therapy, [phone]- Gait training, [phone]- Splinting, 97014- Electrical stimulation (unattended), [phone]- Electrical stimulation (manual), [id number]- Ultrasound, 23762- Traction (mechanical), Patient/Family education, Balance training, Stair training, Taping, Joint mobilization, Joint manipulation, Spinal manipulation, Spinal mobilization, Manual lymph drainage, Scar mobilization, Compression bandaging, Vestibular training, Visual/preceptual remediation/compensation, DME instructions, Cryotherapy, and Moist heat  PLAN FOR NEXT SESSION:  R hip strength and ROM   9:53 AM, 01/17/24 Rosamaria Lints, PT, DPT Physical Therapist - [facility]  Outpatient Physical Therapy- Main Campus [phone]     Bernadette Gores C, PT 01/17/2024, 9:29 AM

## 2024-01-19 ENCOUNTER — Ambulatory Visit: Admitting: Occupational Therapy

## 2024-01-19 ENCOUNTER — Ambulatory Visit

## 2024-01-19 DIAGNOSIS — R262 Difficulty in walking, not elsewhere classified: Secondary | ICD-10-CM

## 2024-01-19 DIAGNOSIS — I890 Lymphedema, not elsewhere classified: Secondary | ICD-10-CM | POA: Diagnosis not present

## 2024-01-19 DIAGNOSIS — M25551 Pain in right hip: Secondary | ICD-10-CM

## 2024-01-19 DIAGNOSIS — M25651 Stiffness of right hip, not elsewhere classified: Secondary | ICD-10-CM

## 2024-01-19 NOTE — Therapy (Signed)
OUTPATIENT OCCUPATIONAL THERAPY TREATMENT NOTE   LOWER EXTREMITY LYMPHEDEMA  Patient Name: Elizabeth Martin MRN: [id number] DOB:06-14-1961, 63 y.o., female Today's Date: 01/19/2024   END OF SESSION:  Lymphedema Episode 2   OT End of Session - 01/19/24 1138     Visit Number 13    Number of Visits 36    Date for OT Re-Evaluation 04/16/24    OT Start Time 0905    OT Stop Time 1010    OT Time Calculation (min) 65 min    Activity Tolerance Patient tolerated treatment well   L hip pain with bed  mobility and repositioning   Behavior During Therapy [facility] for tasks assessed/performed              Past Medical History:  Diagnosis Date   Anxiety    Asthma    Autoimmune disease (HCC)    Cancer (HCC)    Endometrial lymph node removal (30)   Corneal abrasion, right 05/24/2022   Depression    GERD (gastroesophageal reflux disease)    H/O blood clots    upper rt groin and behind both knees   History of hiatal hernia    Hypertension    Lymphedema    right leg   Morphea scleroderma    PONV (postoperative nausea and vomiting)    states gets violently ill   Past Surgical History:  Procedure Laterality Date   BASAL CELL CARCINOMA EXCISION Left    CHOLECYSTECTOMY  2008   FLEXIBLE SIGMOIDOSCOPY N/A 05/19/2022   Procedure: FLEXIBLE SIGMOIDOSCOPY;  Surgeon: Toledo, Boykin Nearing, MD;  Location: [facility] ENDOSCOPY;  Service: Gastroenterology;  Laterality: N/A;   HERNIA REPAIR  2004   ILEOSTOMY CLOSURE N/A 08/24/2022   Procedure: ILEOSTOMY TAKEDOWN, open loop with Lynden Oxford, PA-C to assist;  Surgeon: Henrene Dodge, MD;  Location: [facility] ORS;  Service: General;  Laterality: N/A;   IVC FILTER INSERTION N/A 11/09/2023   Procedure: IVC FILTER INSERTION;  Surgeon: Annice Needy, MD;  Location: [facility] INVASIVE CV LAB;  Service: Cardiovascular;  Laterality: N/A;   IVC FILTER REMOVAL N/A 01/02/2024   Procedure: IVC FILTER REMOVAL;  Surgeon: Annice Needy, MD;  Location: [facility] INVASIVE CV LAB;   Service: Cardiovascular;  Laterality: N/A;   PLANTAR FASCIECTOMY     ROBOTIC ASSISTED TOTAL HYSTERECTOMY WITH BILATERAL SALPINGO OOPHERECTOMY  02/14/2012   ROTATOR CUFF REPAIR Left 03/24/2022   TONSILLECTOMY     TOTAL HIP ARTHROPLASTY Right 11/14/2023   Procedure: TOTAL HIP ARTHROPLASTY - posterior;  Surgeon: Reinaldo Berber, MD;  Location: [facility] ORS;  Service: Orthopedics;  Laterality: Right;   XI ROBOTIC ASSISTED LOWER ANTERIOR RESECTION N/A 05/24/2022   Procedure: XI ROBOTIC ASSISTED LOWER ANTERIOR RESECTION;  Surgeon: Henrene Dodge, MD;  Location: [facility] ORS;  Service: General;  Laterality: N/A;   Patient Active Problem List   Diagnosis Date Noted   Constipation 01/10/2024   Nausea & vomiting 01/10/2024   S/P total right hip arthroplasty 11/14/2023   Blood coagulation disorder (HCC) 11/10/2023   PTSD (post-traumatic stress disorder) 11/10/2023   Severe episode of recurrent major depressive disorder, without psychotic features (HCC) 11/10/2023   GAD (generalized anxiety disorder) 11/10/2023   High risk medication use 11/10/2023   Anxiety and depression 03/22/2023   Cervical radiculopathy 03/16/2023   Colon cancer screening 03/15/2023   Chronic idiopathic constipation 03/15/2023   Dyspnea 03/07/2023   [facility] spotted fever 03/07/2023   Strain of gastrocnemius tendon 03/07/2023   Lower abdominal pain 03/07/2023   Pain in  pelvis 03/07/2023   Small bowel obstruction (HCC) 02/11/2023   Raynaud disease 02/11/2023   Polyarthralgia 02/11/2023   Edema 01/03/2023   Diverticular disease 12/10/2022   Hot flashes 12/10/2022   Pain of right lower extremity 12/10/2022   Paresthesia of lower extremity 12/10/2022   Primary insomnia 12/10/2022   Thyroid nodule 12/10/2022   Multiple joint pain 12/10/2022   Basal cell carcinoma of skin 12/10/2022   Acute bilateral low back pain without sciatica 11/10/2022   Chronic lower back pain 09/05/2022   S/P closure of ileostomy 08/24/2022    Ileostomy in place [facility])    Morphea 07/13/2022   Colonic stricture (HCC) 05/19/2022   Large bowel obstruction (HCC) 05/19/2022   Hypokalemia 05/18/2022   Abdominal pain 05/18/2022   Osteoarthritis of left knee 02/24/2022   Mixed connective tissue disease (HCC) 02/17/2022   Adhesive capsulitis of left shoulder 01/27/2022   Glenohumeral arthritis, left 01/27/2022   Microscopic hematuria 01/12/2022   Otorrhagia of right ear 12/10/2021   Elevated testosterone level in female 11/04/2021   Anti-TPO antibodies present 10/20/2021   Rash 10/13/2021   Hashimoto thyroiditis, 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   Hashimoto's disease 03/23/2021   Exposure to severe acute respiratory syndrome coronavirus 2 (SARS-CoV-2) 12/19/2020   Migraine without aura and without status migrainosus, not intractable 08/19/2020   Myofascial pain 07/30/2020   Neck pain 07/30/2020   Upper back pain 07/30/2020   Cervical facet joint syndrome 07/30/2020   Hair loss 07/14/2020   Edema of lower extremity 07/11/2020   Adenomatous colon polyp 02/26/2020   Gastroesophageal reflux disease 02/15/2020   Diarrhea 02/15/2020   Sleep disorder 01/16/2020   Hyperlipidemia 07/11/2019   Right shoulder pain 04/04/2019   Pre-operative clearance 12/13/2018   Endometrial cancer (HCC) 04/05/2018   Patellofemoral pain syndrome of right knee 03/20/2018   Gastroenteritis 12/07/2017   Fatigue 11/16/2017   Morbid obesity (HCC) 08/11/2017   Stucco keratoses 08/11/2017   Essential hypertension 08/09/2017   History of endometrial cancer 08/09/2017   Lymphedema 08/09/2017   Menopause 08/09/2017   Malignant neoplastic disease (HCC) 04/07/2017   Cellulitis 09/18/2016   Benign hypertension 06/02/2016   Asthma 06/04/1969    PCP: Pennie Banter, MD  REFERRING PROVIDER: Pennie Banter, MD  REFERRING DIAG: I89.0   THERAPY DIAG:  Lymphedema, not elsewhere classified  Rationale for Evaluation and Treatment: Rehabilitation  ONSET DATE: 2013   (Cancer-related, endometrial 2013)  SUBJECTIVE:                                                                                                                                                                                           SUBJECTIVE  STATEMENT:Elizabeth Martin presents to OT for lymphedema care to her RLE/RLQ. Patient presents to s/p R hip arthroplasty 11/14/23. Pt reports she went to ed and was admitted for acute abdominal pain and vomiting.Tests revealed a large stool burden . Pt states today that although she is feeling much better, she has been having painful  gas pains and stomach cramps daily. Pt does not rate lymphedema related pain numerically. She rates R hip pain as 3/10.  PERTINENT HISTORY: Asthma, Autoimmune Disease, Endometrial Ca w/ LND ( 30 bilateral pelvic and periaortic LN), adjuvant chemotherapy and XRT,   H/O blood clots, RLE/RLQ lymphedema, Robotic assisted total hysterectomy w/ bilateral oophorectomy L Rotator Cuff repair, S/P ileostomy 2/2 colon stricture 05/2022 R hip arthroplasty scheduled 11/11/23.   PAIN:  Are you having pain? RLE lymphedema, Yes, not rated: NPRS scale: R HIP  4/10 Pain location: RLE, including hip Pain description: aching, heavy, full, tight Aggravating factors: standing, walking, extended dependent sitting Relieving factors: elevation, movement  PRECAUTIONS: Fall and Other: LYMPHEDEMA  WEIGHT BEARING RESTRICTIONS: Yes 5# lifting restriction- shoulder  FALLS:  Has patient fallen in last 6 months? No  LIVING ENVIRONMENT: Lives with: lives with their spouse Lives in: House/apartment Stairs: No;  Has following equipment at home: Single point cane, Environmental consultant - 2 wheeled, Environmental consultant - 4 wheeled, and Wheelchair (manual)  OCCUPATION: retired Control and instrumentation engineer: gardening, hiking, biking, dancing- unable to participate in any of these due to pain and swelling  HAND DOMINANCE: right   PRIOR LEVEL OF FUNCTION: Independent with basic ADLs, Independent  with household mobility without device, Independent with community mobility without device, Requires assistive device for independence, Needs assistance with homemaking, Needs assistance with transfers, and Leisure: decreased  social participation for leisure pursuits due to impaired mobility and pain  PATIENT GOALS:  Be able to move freely without pain Reduce limb volume to be able to lift limb for basic daily ADLs and functional ambulation Reduce limb volume to increase ability to perform lower body dressing and bathing and grooming Reduce limb to increase body image  OBJECTIVE: Note: Objective measures were completed at Evaluation unless otherwise noted.  COGNITION:  Overall cognitive status: Within functional limits for tasks assessed   OBSERVATIONS / OTHER ASSESSMENTS:   POSTURE: WFL  LE ROM: WFL, but Limited mildly at R knee and ankle due to girth, skin approximation, and joint pain  LE MMT: WFL  Mild, Stage  II, Bilateral Lower Extremity Lymphedema 2/2 CVI and Obesity  Skin  Description Hyper-Keratosis Peau d' Orange Shiny Tight Fibrotic/ Indurated Fatty Doughy Spongy/ boggy   x x x x R>L   x   Skin dry Flaky WNL Macerated   mildly sclerotic     Color Redness Varicosities Blanching Hemosiderin Stain Mottled    x x   x   Odor Malodorous Yeast Fungal infection  WNL      x   Temperature Warm Cool wnl    x     Pitting Edema   1+ 2+ 3+ 4+ Non-pitting         x   Girth Symmetrical Asymmetrical                   Distribution    R>L RLE toes to groin    Stemmer Sign Positive Negative   +    Lymphorrhea History Of:  Present Absent     x    Wounds History Of Present Absent Venous Arterial Pressure Sheer     x  Signs of Infection Redness Warmth Erythema Acute Swelling Drainage Borders                    Sensation Light Touch Deep pressure Hypersensitivity   In tact Impaired In tact Impaired Absent Impaired   x  x  x     Nails WNL   Fungus  nail dystrophy   x     Hair Growth Symmetrical Asymmetrical    R>L   Skin Creases Base of toes  Ankles   Base of Fingers knees       Abdominal pannus Thigh Lobules  Face/neck   x x  x        BLE COMPARATIVE LIMB VOLUMETRICS 10/10/23  LANDMARK RIGHT  10/10/23  R LEG (A-D) 5466.5 ml  R THIGH (E-G) 9186.6 ml  R FULL LIMB (A-G) 14653.1 ml  Limb Volume differential (LVD)  LVD for LEG = 44.1%, R>L LVD for THIGH= 33.98%, R>L LVD for FULL lower extremity 37.8%, R>L  Volume change since last 08/11/22 R LEG is INCREASED in volume by 59.6%. L THIGH is INCREASED by 38 %, and RLE full limb is INCREASED by 45.38%.  Volume change overall V  (Blank rows = not tested)  LANDMARK LEFT  10/12/23  L LEG (A-D) 3053.6 ml  L THIGH (E-G) 6064.8 ml  L  FULL LIMB (A-G) 9118.4 ml  Limb Volume differential (LVD)  %  Volume change since initial %  Volume change overall %  (Blank rows = not tested)   10 th VISIT PROGRESS NOTE RLE COMPARATIVE LIMB VOLUMETRICS 01/04/24: Deferred until next visit by Pt request.  TBA next session  LANDMARK RIGHT    R LEG (A-D) TBA  R THIGH (E-G) TBA  R FULL LIMB (A-G) TBA  Limb Volume differential (LVD)    Volume change since last 08/11/22 TBA  Volume change overall TBA  (Blank rows = not tested)    CA-related LYMPHEDEMA Hx:  SURGERY TYPE/DATE: 2013 NUMBER OF LYMPH NODES REMOVED: 30 by report- bilateral pelvic and periaortic CHEMOTHERAPY: yes RADIATION:yes INFECTIONS: no hx cellulitis GAIT: Distance walked: >500 ft Assistive device utilized: single point cane Level of assistance: Modified independence- extra time Comments: R limp  LYMPHEDEMA LIFE IMPACT SCALE (LLIS): Intake 10/12/23 75% (The extent to which lymphedema related problems impacted your life over the past week)  FOTO (functional outcome measure):10/10/23 INTAKE: 36%  PATIENT EDUCATION:  Education details: Continued Pt/ CG edu for lymphedema self care home program throughout session. Topics include  outcome of comparative limb volumetrics- starting limb volume differentials (LVDs), technology and gradient techniques used for short stretch, multilayer compression wrapping, simple self-MLD, therapeutic lymphatic pumping exercises, skin/nail care, LE precautions,. compression garment recommendations and specifications, wear and care schedule and compression garment donning / doffing w assistive devices. Discussed progress towards all OT goals since commencing CDT. All questions answered to the Pt's satisfaction. Good return. Person educated: Patient Education method: Explanation, Demonstration, and Handouts Education comprehension: verbalized understanding, returned demonstration, and needs further education  LE SELF-CARE HOME  PROGRAM: Simple self-MLD/daily to affected quadrant and body part At least 2 x daily BLE Lymphatic Pumping There ex 1 set of 10 reps each, in order, bilaterally Daily skin care to affected body part to limit infection risk and increase skin excursion Compression Bandaging Intensive stage compression: multilayer short stretch wraps with gradient techniques. One limb at a time. Length patient dependent. Self-management Phase: Appropriate daytime compression garment and hours-of-sleep device  Compression garments: Custom-made gradient compression  garments and hours-of-sleep devices are medically necessary because they are uniquely sized and shaped to fit the exact dimensions of the affected extremities and to provide accurate and consistent gradient compression and containment, essential  for optimally managing chronic, progressive lymphedema. The convoluted HOS devices are medically necessary to facilitate increased lymphatic circulation and limit fibrosis formation when sleeping. Multiple custom compression garments are needed for optimal hygiene to limit infection risk. Custom compression garments should be replaced q 3-6 months When worn consistently for optimal  lipo-lymphedema self-management over time.  Pt will benefit from [id number]- Circaid reduction kit whole leg to increase independence with lymphedema self care, to  reduce limb volume  for body symmetry and balance, and to limit infection risk and progression.  ASSESSMENT:  CLINICAL IMPRESSION: Emphasis of visit on manual lymphatic drainage in supine and side lying, including fibrosis techniques to leg and foot towards ipsilateral axillary lymph nodes, functional inguinal nodes,  and deep abdominal lymphatics.  Applied full limb  multilayer, gradient compression wraps  after manual therapy from base of toes to groin. Cont as per POC to assist with healing, to reduce limb swelling and progression of tissue hardening due to both lymphedema and auto immune issues, and to assist with normalizing functional ambulation and mobility. Priority is reducing swelling to enable Pt to resume wearing custom compression garments ASAP to reduce bulky wraps.   10/10/23 Lymphedema Episode 2, Initial OT Evaluation : Elizabeth Martin is a 2 y a female presenting with chronic, progressive, moderate, Stage II, RLE/RLQ cancer -related lymphedema with onset 11 years ago after Rx for endometrial cancer. Pt is well known to this therapist as she has successfully undergone Complete Decongestive Therapy (CDT)  this clinic bin the past. Pt reports recent exacerbation of RLE swelling worsened as inflammation in L hip has worsened over time.  Pt has a hip replacement scheduled in late December here it [facility]. RE limb volumetrics today reveal that R LEG volume is dramatically increased by 59.65 since last visit. R LEG VOLUME is INCREASED in volume by 59.6%. L THIGH is INCREASED by 38 %, and RLE full limb is INCREASED by 45.38%. Pt presents with LE-related skin changes. Prolonged inflammation in the R hip joint has likely overloaded  already RLE/RLQ lymphatics.  RLE/RLQ lymphedema limits Pt's ability to perform basic and instrumental ADLs,  including functional ambulation, mobility and transfers, grooming , lower body bathing and dressing, skin inspection and skin care. Pt has difficulty  reaching her lower legs and feet to apply compression wraps and don/doff        compression stockings. LE limits ability to P[perform instrumental ADLs, including driving, yard work and home management activities. Lymphedema limits her ability to participate in leisure pursuits and productive activities, and it negatively impacts body image, life roles and quality of life.   Pt will benefit from an Intensive and follow along course of lymphedema. CDT will consist of Manual Lymphatic gainage   (MLD), skin care, therapeutic exercise and compression, wraps initially then garments. Pt will need assistance throughout CDT   for applying compression wraps due to limited hip AROM and decreased skin flexibility. Without skilled Occupational Therapy for lymphedema care, lymphedema will progress and further functional decline is expected.   In prep for upcoming hip replacement OT will educate Pt re assistive devices, including tub transfer bench and elevated toilet seat, in keeping with hip precautions.  OBJECTIVE IMPAIRMENTS: Abnormal gait, decreased activity tolerance, decreased balance, decreased knowledge of use of DME, decreased mobility, difficulty  walking, decreased ROM, decreased strength, increased edema, decreased skin flexibility, increased fascial restrictions, impaired sensation, pain, and chronic, progressive LLE/LLQ lymphatic swelling and associated pain.   ADL LIMITATIONS: carrying, lifting, bending, sitting, standing, squatting, sleeping, stairs, transfers, bed mobility, bathing, dressing, hygiene/grooming, and productive activities, leisure pursuits, social participation, body image  driving, shopping, housework, yard work, cooking, meal prep  PERSONAL FACTORS: Past/current experiences and 3+ comorbidities: Morphea Scleroderma, Mixed connective tissue  disease, and osteoarthritis  are also affecting patient's functional outcome.   REHAB POTENTIAL: Good  EVALUATION COMPLEXITY: Moderate  GOALS: Goals reviewed with patient? Yes  SHORT TERM GOALS: Target date: 4th OT Rx visit   Pt will demonstrate understanding of lymphedema precautions and prevention strategies with modified independence using a printed reference to identify at least 5 precautions and discussing how s/he may implement them into daily life to reduce risk of progression with extra time.  Baseline:Max A Goal status: GOAL MET  2.  Pt will be able to apply multilayer, knee length, gradient, compression wraps to one leg at a time with modified assistance (extra time and assistive device/s) to decrease limb volume, to limit infection risk, and to limit lymphedema progression.  Baseline: Dependent Goal status: GOAL MET  LONG TERM GOALS: Target date: 01/09/24 (12 weeks)  Given this patient's Intake score 36% on the functional outcomes FOTO tool, patient will experience an increase in function of 3 points to improve basic and instrumental ADLs performance, including lymphedema self-care.  Baseline: Max A Goal status: DEFERRED as Janyth Contes has been discontinued  2.  Given this patient's Intake score of  75% on the Lymphedema Life Impact Scale (LLIS), patient will experience a reduction of at least 5 points in her perceived level of functional impairment resulting from lymphedema to improve functional performance and quality of life (QOL). Baseline: % Goal status: PROGRESSING  3.  Pt will achieve at least a 10% volume reduction in full, RLE limb volume to return limb to typical size and shape, to limit infection risk and LE progression, to decrease pain, to improve function. Baseline: Dependent Goal status:PROGRESSING. Volumetrics deferred today by Pt request in order to increase time for manual therapy which provides pain relief. Volumetrics next visit. By visual assessment I would  estimate ~10% volume reduction for entire RLE to DATE. tissue DENSITY IN THIGH HAS REDUCED MILDLY AND SLIGHTLY IN LEG.   4.  Pt will obtain proper compression garments/devices and achieve modified independence (extra time + assistive devices) with donning/doffing to optimize limb volume reductions and limit LE  progression over time. Baseline:  Goal status: 01/04/24: PARTIALLY MET; In an effort to reduce burden of care on her spouse, Pt obtained adjustable, Velcro style R full leg, Circaid leg reduction kit for use s/p THA, but this proved not effective for controlling swelling, and were not easier to don and doff more independently. We've resumed wrapping until she is able to fit back into custom compression garments.  5.  During Intensive phase CDT , with modified independence, Pt will achieve at least 85% compliance with all lymphedema self-care home program components, including daily skin care, compression wraps and /or garments, simple self MLD and lymphatic pumping therex to habituate LE self care protocol  into ADLs for optimal LE self-management over time. Baseline: Dependent Goal status: ONGOING PLAN:  OT FREQUENCY: 2x/week  OT DURATION: 12 weeks and PRN  PLANNED INTERVENTIONS: Complete Decongestive Therapy (Intensive and supported Self-Management Phases), 97110-Therapeutic exercises, 97530- Therapeutic activity, 97535- Self Care, 16109- Manual therapy, Patient/Family  education, Taping, Manual lymph drainage, Scar mobilization, Compression bandaging, DME instructions, and skin care to reduce infection risk throughout manual therapy. Fit with replacement compression garments ASAP  PLAN FOR NEXT SESSION:  Complete RLE comparative limb volumetrics for progress note update Cont RLE/RLQ MLD Cont multilayer compression wrapping    Loel Dubonnet, MS, OTR/L, CLT-LANA 01/19/24 11:41 AM  01/19/2024, 11:41 AM

## 2024-01-19 NOTE — Therapy (Signed)
OUTPATIENT PHYSICAL THERAPY TREATMENT   Patient Name: Elizabeth Martin MRN: [id number] DOB:[birth year], 63 y.o., female Today's Date: 01/19/2024  END OF SESSION:  PT End of Session - 01/19/24 0810     Visit Number 4    Number of Visits 16    Date for PT Re-Evaluation 03/01/24    Authorization Type [employer]    Progress Note Due on Visit 10    PT Start Time 0810    PT Stop Time 0845    PT Time Calculation (min) 35 min    Equipment Utilized During Treatment Gait belt    Activity Tolerance Patient tolerated treatment well;No increased pain    Behavior During Therapy WFL for tasks assessed/performed               Past Medical History:  Diagnosis Date   Anxiety    Asthma    Autoimmune disease (HCC)    Cancer (HCC)    Endometrial lymph node removal (30)   Corneal abrasion, right 05/24/2022   Depression    GERD (gastroesophageal reflux disease)    H/O blood clots    upper rt groin and behind both knees   History of hiatal hernia    Hypertension    Lymphedema    right leg   Morphea scleroderma    PONV (postoperative nausea and vomiting)    states gets violently ill   Past Surgical History:  Procedure Laterality Date   BASAL CELL CARCINOMA EXCISION Left    CHOLECYSTECTOMY  2008   FLEXIBLE SIGMOIDOSCOPY N/A 05/19/2022   Procedure: FLEXIBLE SIGMOIDOSCOPY;  Surgeon: Toledo, Boykin Nearing, MD;  Location: [facility];  Service: Gastroenterology;  Laterality: N/A;   HERNIA REPAIR  2004   ILEOSTOMY CLOSURE N/A 08/24/2022   Procedure: ILEOSTOMY TAKEDOWN, open loop with Lynden Oxford, PA-C to assist;  Surgeon: Henrene Dodge, MD;  Location: [facility];  Service: General;  Laterality: N/A;   IVC FILTER INSERTION N/A 11/09/2023   Procedure: IVC FILTER INSERTION;  Surgeon: Annice Needy, MD;  Location: [facility];  Service: Cardiovascular;  Laterality: N/A;   IVC FILTER REMOVAL N/A 01/02/2024   Procedure: IVC FILTER REMOVAL;  Surgeon: Annice Needy, MD;  Location: [facility]  INVASIVE CV [facility];  Service: Cardiovascular;  Laterality: N/A;   PLANTAR FASCIECTOMY     ROBOTIC ASSISTED TOTAL HYSTERECTOMY WITH BILATERAL SALPINGO OOPHERECTOMY  02/14/2012   ROTATOR CUFF REPAIR Left 03/24/2022   TONSILLECTOMY     TOTAL HIP ARTHROPLASTY Right 11/14/2023   Procedure: TOTAL HIP ARTHROPLASTY - posterior;  Surgeon: Reinaldo Berber, MD;  Location: [facility];  Service: Orthopedics;  Laterality: Right;   XI ROBOTIC ASSISTED LOWER ANTERIOR RESECTION N/A 05/24/2022   Procedure: XI ROBOTIC ASSISTED LOWER ANTERIOR RESECTION;  Surgeon: Henrene Dodge, MD;  Location: [facility];  Service: General;  Laterality: N/A;   Patient Active Problem List   Diagnosis Date Noted   Constipation 01/10/2024   Nausea & vomiting 01/10/2024   S/P total right hip arthroplasty 11/14/2023   Blood coagulation disorder (HCC) 11/10/2023   PTSD (post-traumatic stress disorder) 11/10/2023   Severe episode of recurrent major depressive disorder, without psychotic features (HCC) 11/10/2023   GAD (generalized anxiety disorder) 11/10/2023   High risk medication use 11/10/2023   Anxiety and depression 03/22/2023   Cervical radiculopathy 03/16/2023   Colon cancer screening 03/15/2023   Chronic idiopathic constipation 03/15/2023   Dyspnea 03/07/2023   [place] spotted fever 03/07/2023   Strain of gastrocnemius tendon 03/07/2023   Lower abdominal  pain 03/07/2023   Pain in pelvis 03/07/2023   Small bowel obstruction (HCC) 02/11/2023   Raynaud disease 02/11/2023   Polyarthralgia 02/11/2023   Edema 01/03/2023   Diverticular disease 12/10/2022   Hot flashes 12/10/2022   Pain of right lower extremity 12/10/2022   Paresthesia of lower extremity 12/10/2022   Primary insomnia 12/10/2022   Thyroid nodule 12/10/2022   Multiple joint pain 12/10/2022   Basal cell carcinoma of skin 12/10/2022   Acute bilateral low back pain without sciatica 11/10/2022   Chronic lower back pain 09/05/2022   S/P closure of ileostomy  08/24/2022   Ileostomy in place [facility])    Morphea 07/13/2022   Colonic stricture (HCC) 05/19/2022   Large bowel obstruction (HCC) 05/19/2022   Hypokalemia 05/18/2022   Abdominal pain 05/18/2022   Osteoarthritis of left knee 02/24/2022   Mixed connective tissue disease (HCC) 02/17/2022   Adhesive capsulitis of left shoulder 01/27/2022   Glenohumeral arthritis, left 01/27/2022   Microscopic hematuria 01/12/2022   Otorrhagia of right ear 12/10/2021   Elevated testosterone level in female 11/04/2021   Anti-TPO antibodies present 10/20/2021   Rash 10/13/2021   Hashimoto thyroiditis, 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   Hashimoto's disease 03/23/2021   Exposure to severe acute respiratory syndrome coronavirus 2 (SARS-CoV-2) 12/19/2020   Migraine without aura and without status migrainosus, not intractable 08/19/2020   Myofascial pain 07/30/2020   Neck pain 07/30/2020   Upper back pain 07/30/2020   Cervical facet joint syndrome 07/30/2020   Hair loss 07/14/2020   Edema of lower extremity 07/11/2020   Adenomatous colon polyp 02/26/2020   Gastroesophageal reflux disease 02/15/2020   Diarrhea 02/15/2020   Sleep disorder 01/16/2020   Hyperlipidemia 07/11/2019   Right shoulder pain 04/04/2019   Pre-operative clearance 12/13/2018   Endometrial cancer (HCC) 04/05/2018   Patellofemoral pain syndrome of right knee 03/20/2018   Gastroenteritis 12/07/2017   Fatigue 11/16/2017   Morbid obesity (HCC) 08/11/2017   Stucco keratoses 08/11/2017   Essential hypertension 08/09/2017   History of endometrial cancer 08/09/2017   Lymphedema 08/09/2017   Menopause 08/09/2017   Malignant neoplastic disease (HCC) 04/07/2017   Cellulitis 09/18/2016   Benign hypertension 06/02/2016   Asthma 06/04/1969    PCP: Pennie Banter MD   REFERRING PROVIDER: Pennie Banter MD   REFERRING DIAG: s/p R hip replacement  THERAPY DIAG:  Pain in right hip  Stiffness of right hip, not elsewhere classified  Difficulty  in walking, not elsewhere classified  Rationale for Evaluation and Treatment: Rehabilitation  ONSET DATE: 11/14/23  SUBJECTIVE:   SUBJECTIVE STATEMENT: Patient reports she is feeling better now that she is taking tylenol on a regular basis. Reports feeling sore after last session due to soreness.   PERTINENT HISTORY: s/p R hip arthropaslasty 11/14/23 with Dr. Audelia Acton posterior approach, blood coagulation disorder, PTSD, anxiety, cervical radiculopathy dypsnea, polyarthralgia, Raynaud disease, s/p closure of ileostomy, Hashimoto thyroid, endometrial cancer, menopause, endometrial cancer, lymphedema. Patient had home health PT prior to PT here.   PAIN:  Are you having pain?  4/10 Rt hip joint pain, posterior portion of hip.    PRECAUTIONS: Other: recovering from hip sx  RED FLAGS: None   WEIGHT BEARING RESTRICTIONS: No  FALLS:  Has patient fallen in last 6 months? Yes. Number of falls 2  LIVING ENVIRONMENT: Lives with: lives with their spouse Lives in: House/apartment Stairs: No Has following equipment at home: Single point cane  OCCUPATION: retired,   PLOF: Independent  PATIENT GOALS: return to hiking, working out, gardening.  NEXT MD VISIT: March 2025   OBJECTIVE:  Note: Objective measures were completed at Evaluation unless otherwise noted.  DIAGNOSTIC FINDINGS:   IMPRESSION: Right hip replacement.  No visible complicating feature.  PATIENT SURVEYS:  LEFS 5/80  COGNITION: Overall cognitive status: Within functional limits for tasks assessed    MUSCLE LENGTH: Hamstrings: limited bilaterally with R>L   LOWER EXTREMITY ROM:  Passive ROM Right eval Left eval  Hip flexion 48 flexion PROM supine   Hip extension    Hip abduction 5 degrees AROM     (Blank rows = not tested)  LOWER EXTREMITY MMT:  MMT Right eval Left eval  Hip flexion 2+ 5  Hip extension  5  Hip abduction 2+* painful 5  Hip adduction 2+ 5  Hip internal rotation    Hip external  rotation    Knee flexion 3 5  Knee extension 3 5  Ankle dorsiflexion 3 5  Ankle plantarflexion 3 5   (Blank rows = not tested)    FUNCTIONAL TESTS:  5 times sit to stand: 29 6 minute walk test:  10 meter walk test: 15 seconds with SPC   GAIT: Distance walked: 60 ft Assistive device utilized: Single point cane Level of assistance: CGA Comments: antalgic gait pattern with decreased stance time RLE. Slight vaulting pattern.  Stair negotiation: -step to pattern lead with LLE, one hand on cane one hand on rail ascending. Descending step to pattern down with RLE.                                                                                                                               TREATMENT DATE: 01/19/24  TE- To improve strength, endurance, mobility, and function of specific targeted muscle groups or improve joint range of motion or improve muscle flexibility  Standing at bar:  -march 10x each LE -hip extension toe taps 10x each leg -hip abduction 10x each LE   Hamstring stretch 30 seconds each LE  adduction squeeze 10x  TherAct: 4" step toe taps 10x each LE   Seated slider flexion/extension with focus on coordination x15   Neuro Standing with CGA next to support surface:  Airex pad: static stand 30 seconds x 2 trials, noticeable trembling of ankles/LE's with fatigue and challenge to maintain stability Airex pad: horizontal head turns 30 seconds scanning room 10x ; cueing for arc of motion  Airex pad: vertical head turns 30 seconds, cueing for arc of motion, noticeable sway with upward gaze increasing demand on ankle righting reaction musculature Airex RUE:AVWU task word game x 8 minutes    PATIENT EDUCATION:  Education details: goals, POC, HEP Person educated: Patient Education method: Explanation, Demonstration, Tactile cues, and Verbal cues Education comprehension: verbalized understanding, returned demonstration, verbal cues required, tactile cues required,  and needs further education  HOME EXERCISE PROGRAM: Access Code: [facility] URL: [email or web address] Date: 01/11/2024 Prepared by: Thresa Ross  Exercises - Side Stepping with Counter Support  -  1 x daily - 7 x weekly - 2 sets - 10 reps - 5 hold - Standing Hip Extension  - 1 x daily - 7 x weekly - 2 sets - 10 reps - 5 hold - Standing March with Counter Support  - 1 x daily - 7 x weekly - 2 sets - 10 reps - 5 hold - Standing Hip Abduction with Counter Support  - 1 x daily - 7 x weekly - 2 sets - 10 reps - Seated Hamstring Stretch  - 1 x daily - 7 x weekly - 2 sets - 2 reps - 30 hold - Sit to Stand  - 1 x daily - 7 x weekly - 2 sets - 10 reps - 5 hold - Supine Heel Slide  - 1 x daily - 7 x weekly - 2 sets - 6 reps - Supine Gluteal Sets  - 1 x daily - 7 x weekly - 2 sets - 10 reps  ASSESSMENT: CLINICAL IMPRESSION: Patient session limited by late arrival. Patient has soreness in post surgical hip requiring intermittent rest breaks. Cues for muscle activation and weight shift tolerated well. Pt will continue to benefit from skilled physical therapy intervention to address impairments, improve QOL, and attain therapy goals.    OBJECTIVE IMPAIRMENTS: Abnormal gait, decreased activity tolerance, decreased balance, decreased coordination, decreased endurance, decreased mobility, difficulty walking, decreased ROM, decreased strength, hypomobility, increased edema, impaired perceived functional ability, impaired flexibility, improper body mechanics, postural dysfunction, and pain.   ACTIVITY LIMITATIONS: carrying, lifting, bending, sitting, standing, squatting, sleeping, stairs, transfers, bed mobility, bathing, toileting, dressing, reach over head, hygiene/grooming, locomotion level, and caring for others  PARTICIPATION LIMITATIONS: meal prep, cleaning, laundry, interpersonal relationship, driving, shopping, community activity, and yard work  PERSONAL FACTORS: Age, Past/current  experiences, Time since onset of injury/illness/exacerbation, and 3+ comorbidities: blood coagulation disorder, PTSD, anxiety, cervical radiculopathy dypsnea, polyarthralgia, Raynaud disease, s/p closure of ileostomy, Hashimoto thyroid, endometrial cancer, menopause, endometrial cancer, lymphedema  are also affecting patient's functional outcome.   REHAB POTENTIAL: Good  CLINICAL DECISION MAKING: Evolving/moderate complexity  EVALUATION COMPLEXITY: Moderate   GOALS: Goals reviewed with patient? Yes  SHORT TERM GOALS: Target date: 01/19/2024  Patient will be independent in home exercise program to improve strength/mobility for better functional independence with ADLs. Baseline:1/30: HEP given Goal status: INITIAL  LONG TERM GOALS: Target date: 03/01/2024  Patient (> 24 years old) will complete five times sit to stand test in < 10 seconds indicating an increased LE strength and improved balance. Baseline: 1/30: 29 seconds Goal status: INITIAL  2.  Patient will increase six minute walk test distance to >1000 for progression to community ambulator and improve gait ability Baseline: 430 ft in 5 min and required seated rest to end test due to fatigue, uses SPC Goal status: INITIAL  3.  Patient will increase 10 meter walk test to >1.101m/s as to improve gait speed for better community ambulation and to reduce fall risk. Baseline: 1/30: 15 seconds with SPC  Goal status: INITIAL  4.  Patient will increase lower extremity functional scale to >60/80 to demonstrate improved functional mobility and increased tolerance with ADLs.  Baseline: 1/30: 5/80 Goal status: INITIAL  PLAN:  PT FREQUENCY: 2x/week PT DURATION: 8 weeks PLANNED INTERVENTIONS: [id number]- PT Re-evaluation, 97110-Therapeutic exercises, 97530- Therapeutic activity, 97112- Neuromuscular re-education, 97535- Self Care, 95621- Manual therapy, [id number]- Gait training, [phone]- Splinting, 97014- Electrical stimulation (unattended), [id number]  Electrical stimulation (manual), [id number]- Ultrasound, 78469- Traction (mechanical), Patient/Family education, Balance training, Stair training,  Taping, Joint mobilization, Joint manipulation, Spinal manipulation, Spinal mobilization, Manual lymph drainage, Scar mobilization, Compression bandaging, Vestibular training, Visual/preceptual remediation/compensation, DME instructions, Cryotherapy, and Moist heat  PLAN FOR NEXT SESSION:  R hip strength and ROM   8:52 AM, 01/19/24    Precious Bard, PT 01/19/2024, 8:52 AM

## 2024-01-19 NOTE — Therapy (Deleted)
OUTPATIENT PHYSICAL THERAPY TREATMENT   Patient Name: Elizabeth Martin MRN: [id number] DOB:[birth year], 63 y.o., female Today's Date: 01/19/2024  END OF SESSION:      Past Medical History:  Diagnosis Date   Anxiety    Asthma    Autoimmune disease (HCC)    Cancer (HCC)    Endometrial lymph node removal (30)   Corneal abrasion, right 05/24/2022   Depression    GERD (gastroesophageal reflux disease)    H/O blood clots    upper rt groin and behind both knees   History of hiatal hernia    Hypertension    Lymphedema    right leg   Morphea scleroderma    PONV (postoperative nausea and vomiting)    states gets violently ill   Past Surgical History:  Procedure Laterality Date   BASAL CELL CARCINOMA EXCISION Left    CHOLECYSTECTOMY  2008   FLEXIBLE SIGMOIDOSCOPY N/A 05/19/2022   Procedure: FLEXIBLE SIGMOIDOSCOPY;  Surgeon: Toledo, Boykin Nearing, MD;  Location: [facility];  Service: Gastroenterology;  Laterality: N/A;   HERNIA REPAIR  2004   ILEOSTOMY CLOSURE N/A 08/24/2022   Procedure: ILEOSTOMY TAKEDOWN, open loop with Lynden Oxford, PA-C to assist;  Surgeon: Henrene Dodge, MD;  Location: [facility];  Service: General;  Laterality: N/A;   IVC FILTER INSERTION N/A 11/09/2023   Procedure: IVC FILTER INSERTION;  Surgeon: Annice Needy, MD;  Location: [facility];  Service: Cardiovascular;  Laterality: N/A;   IVC FILTER REMOVAL N/A 01/02/2024   Procedure: IVC FILTER REMOVAL;  Surgeon: Annice Needy, MD;  Location: [facility];  Service: Cardiovascular;  Laterality: N/A;   PLANTAR FASCIECTOMY     ROBOTIC ASSISTED TOTAL HYSTERECTOMY WITH BILATERAL SALPINGO OOPHERECTOMY  02/14/2012   ROTATOR CUFF REPAIR Left 03/24/2022   TONSILLECTOMY     TOTAL HIP ARTHROPLASTY Right 11/14/2023   Procedure: TOTAL HIP ARTHROPLASTY - posterior;  Surgeon: Reinaldo Berber, MD;  Location: [facility];  Service: Orthopedics;  Laterality: Right;   XI ROBOTIC ASSISTED LOWER ANTERIOR RESECTION  N/A 05/24/2022   Procedure: XI ROBOTIC ASSISTED LOWER ANTERIOR RESECTION;  Surgeon: Henrene Dodge, MD;  Location: [facility];  Service: General;  Laterality: N/A;   Patient Active Problem List   Diagnosis Date Noted   Constipation 01/10/2024   Nausea & vomiting 01/10/2024   S/P total right hip arthroplasty 11/14/2023   Blood coagulation disorder (HCC) 11/10/2023   PTSD (post-traumatic stress disorder) 11/10/2023   Severe episode of recurrent major depressive disorder, without psychotic features (HCC) 11/10/2023   GAD (generalized anxiety disorder) 11/10/2023   High risk medication use 11/10/2023   Anxiety and depression 03/22/2023   Cervical radiculopathy 03/16/2023   Colon cancer screening 03/15/2023   Chronic idiopathic constipation 03/15/2023   Dyspnea 03/07/2023   [place] spotted fever 03/07/2023   Strain of gastrocnemius tendon 03/07/2023   Lower abdominal pain 03/07/2023   Pain in pelvis 03/07/2023   Small bowel obstruction (HCC) 02/11/2023   Raynaud disease 02/11/2023   Polyarthralgia 02/11/2023   Edema 01/03/2023   Diverticular disease 12/10/2022   Hot flashes 12/10/2022   Pain of right lower extremity 12/10/2022   Paresthesia of lower extremity 12/10/2022   Primary insomnia 12/10/2022   Thyroid nodule 12/10/2022   Multiple joint pain 12/10/2022   Basal cell carcinoma of skin 12/10/2022   Acute bilateral low back pain without sciatica 11/10/2022   Chronic lower back pain 09/05/2022   S/P closure of ileostomy 08/24/2022   Ileostomy in place [facility])  Morphea 07/13/2022   Colonic stricture (HCC) 05/19/2022   Large bowel obstruction (HCC) 05/19/2022   Hypokalemia 05/18/2022   Abdominal pain 05/18/2022   Osteoarthritis of left knee 02/24/2022   Mixed connective tissue disease (HCC) 02/17/2022   Adhesive capsulitis of left shoulder 01/27/2022   Glenohumeral arthritis, left 01/27/2022   Microscopic hematuria 01/12/2022   Otorrhagia of right ear 12/10/2021    Elevated testosterone level in female 11/04/2021   Anti-TPO antibodies present 10/20/2021   Rash 10/13/2021   Hashimoto thyroiditis, 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   Hashimoto's disease 03/23/2021   Exposure to severe acute respiratory syndrome coronavirus 2 (SARS-CoV-2) 12/19/2020   Migraine without aura and without status migrainosus, not intractable 08/19/2020   Myofascial pain 07/30/2020   Neck pain 07/30/2020   Upper back pain 07/30/2020   Cervical facet joint syndrome 07/30/2020   Hair loss 07/14/2020   Edema of lower extremity 07/11/2020   Adenomatous colon polyp 02/26/2020   Gastroesophageal reflux disease 02/15/2020   Diarrhea 02/15/2020   Sleep disorder 01/16/2020   Hyperlipidemia 07/11/2019   Right shoulder pain 04/04/2019   Pre-operative clearance 12/13/2018   Endometrial cancer (HCC) 04/05/2018   Patellofemoral pain syndrome of right knee 03/20/2018   Gastroenteritis 12/07/2017   Fatigue 11/16/2017   Morbid obesity (HCC) 08/11/2017   Stucco keratoses 08/11/2017   Essential hypertension 08/09/2017   History of endometrial cancer 08/09/2017   Lymphedema 08/09/2017   Menopause 08/09/2017   Malignant neoplastic disease (HCC) 04/07/2017   Cellulitis 09/18/2016   Benign hypertension 06/02/2016   Asthma 06/04/1969    PCP: Pennie Banter MD   REFERRING PROVIDER: Pennie Banter MD   REFERRING DIAG: s/p R hip replacement  THERAPY DIAG:  Lymphedema, not elsewhere classified  Rationale for Evaluation and Treatment: Rehabilitation  ONSET DATE: 11/14/23  SUBJECTIVE:   SUBJECTIVE STATEMENT: Patient reports she is feeling better now that she is taking tylenol on a regular basis. Reports feeling sore after last session due to soreness.   PERTINENT HISTORY: s/p R hip arthropaslasty 11/14/23 with Dr. Audelia Acton posterior approach, blood coagulation disorder, PTSD, anxiety, cervical radiculopathy dypsnea, polyarthralgia, Raynaud disease, s/p closure of ileostomy, Hashimoto  thyroid, endometrial cancer, menopause, endometrial cancer, lymphedema. Patient had home health PT prior to PT here.   PAIN:  Are you having pain?  4/10 Rt hip joint pain, posterior portion of hip.    PRECAUTIONS: Other: recovering from hip sx  RED FLAGS: None   WEIGHT BEARING RESTRICTIONS: No  FALLS:  Has patient fallen in last 6 months? Yes. Number of falls 2  LIVING ENVIRONMENT: Lives with: lives with their spouse Lives in: House/apartment Stairs: No Has following equipment at home: Single point cane  OCCUPATION: retired,   PLOF: Independent  PATIENT GOALS: return to hiking, working out, gardening.   NEXT MD VISIT: March 2025   OBJECTIVE:  Note: Objective measures were completed at Evaluation unless otherwise noted.  DIAGNOSTIC FINDINGS:   IMPRESSION: Right hip replacement.  No visible complicating feature.  PATIENT SURVEYS:  LEFS 5/80  COGNITION: Overall cognitive status: Within functional limits for tasks assessed    MUSCLE LENGTH: Hamstrings: limited bilaterally with R>L   LOWER EXTREMITY ROM:  Passive ROM Right eval Left eval  Hip flexion 48 flexion PROM supine   Hip extension    Hip abduction 5 degrees AROM     (Blank rows = not tested)  LOWER EXTREMITY MMT:  MMT Right eval Left eval  Hip flexion 2+ 5  Hip extension  5  Hip abduction 2+*  painful 5  Hip adduction 2+ 5  Hip internal rotation    Hip external rotation    Knee flexion 3 5  Knee extension 3 5  Ankle dorsiflexion 3 5  Ankle plantarflexion 3 5   (Blank rows = not tested)    FUNCTIONAL TESTS:  5 times sit to stand: 29 6 minute walk test:  10 meter walk test: 15 seconds with SPC   GAIT: Distance walked: 60 ft Assistive device utilized: Single point cane Level of assistance: CGA Comments: antalgic gait pattern with decreased stance time RLE. Slight vaulting pattern.  Stair negotiation: -step to pattern lead with LLE, one hand on cane one hand on rail ascending.  Descending step to pattern down with RLE.                                                                                                                               TREATMENT DATE: 01/19/24  TE- To improve strength, endurance, mobility, and function of specific targeted muscle groups or improve joint range of motion or improve muscle flexibility  Standing at bar:  -march 10x each LE -hip extension toe taps 10x each leg -hip abduction 10x each LE   Hamstring stretch 30 seconds each LE  adduction squeeze 10x  TherAct: 4" step toe taps 10x each LE   Seated slider flexion/extension with focus on coordination x15   Neuro Standing with CGA next to support surface:  Airex pad: static stand 30 seconds x 2 trials, noticeable trembling of ankles/LE's with fatigue and challenge to maintain stability Airex pad: horizontal head turns 30 seconds scanning room 10x ; cueing for arc of motion  Airex pad: vertical head turns 30 seconds, cueing for arc of motion, noticeable sway with upward gaze increasing demand on ankle righting reaction musculature Airex ZOX:WRUE task word game x 8 minutes    PATIENT EDUCATION:  Education details: goals, POC, HEP Person educated: Patient Education method: Explanation, Demonstration, Tactile cues, and Verbal cues Education comprehension: verbalized understanding, returned demonstration, verbal cues required, tactile cues required, and needs further education  HOME EXERCISE PROGRAM: Access Code: [facility] URL: [email or web address] Date: 01/11/2024 Prepared by: Thresa Ross  Exercises - Side Stepping with Counter Support  - 1 x daily - 7 x weekly - 2 sets - 10 reps - 5 hold - Standing Hip Extension  - 1 x daily - 7 x weekly - 2 sets - 10 reps - 5 hold - Standing March with Counter Support  - 1 x daily - 7 x weekly - 2 sets - 10 reps - 5 hold - Standing Hip Abduction with Counter Support  - 1 x daily - 7 x weekly - 2 sets - 10 reps -  Seated Hamstring Stretch  - 1 x daily - 7 x weekly - 2 sets - 2 reps - 30 hold - Sit to Stand  - 1 x daily - 7 x weekly - 2 sets - 10  reps - 5 hold - Supine Heel Slide  - 1 x daily - 7 x weekly - 2 sets - 6 reps - Supine Gluteal Sets  - 1 x daily - 7 x weekly - 2 sets - 10 reps  ASSESSMENT: CLINICAL IMPRESSION: Patient session limited by late arrival. Patient has soreness in post surgical hip requiring intermittent rest breaks. Cues for muscle activation and weight shift tolerated well. Pt will continue to benefit from skilled physical therapy intervention to address impairments, improve QOL, and attain therapy goals.    OBJECTIVE IMPAIRMENTS: Abnormal gait, decreased activity tolerance, decreased balance, decreased coordination, decreased endurance, decreased mobility, difficulty walking, decreased ROM, decreased strength, hypomobility, increased edema, impaired perceived functional ability, impaired flexibility, improper body mechanics, postural dysfunction, and pain.   ACTIVITY LIMITATIONS: carrying, lifting, bending, sitting, standing, squatting, sleeping, stairs, transfers, bed mobility, bathing, toileting, dressing, reach over head, hygiene/grooming, locomotion level, and caring for others  PARTICIPATION LIMITATIONS: meal prep, cleaning, laundry, interpersonal relationship, driving, shopping, community activity, and yard work  PERSONAL FACTORS: Age, Past/current experiences, Time since onset of injury/illness/exacerbation, and 3+ comorbidities: blood coagulation disorder, PTSD, anxiety, cervical radiculopathy dypsnea, polyarthralgia, Raynaud disease, s/p closure of ileostomy, Hashimoto thyroid, endometrial cancer, menopause, endometrial cancer, lymphedema  are also affecting patient's functional outcome.   REHAB POTENTIAL: Good  CLINICAL DECISION MAKING: Evolving/moderate complexity  EVALUATION COMPLEXITY: Moderate   GOALS: Goals reviewed with patient? Yes  SHORT TERM GOALS: Target  date: 01/19/2024  Patient will be independent in home exercise program to improve strength/mobility for better functional independence with ADLs. Baseline:1/30: HEP given Goal status: INITIAL  LONG TERM GOALS: Target date: 03/01/2024  Patient (> 22 years old) will complete five times sit to stand test in < 10 seconds indicating an increased LE strength and improved balance. Baseline: 1/30: 29 seconds Goal status: INITIAL  2.  Patient will increase six minute walk test distance to >1000 for progression to community ambulator and improve gait ability Baseline: 430 ft in 5 min and required seated rest to end test due to fatigue, uses SPC Goal status: INITIAL  3.  Patient will increase 10 meter walk test to >1.79m/s as to improve gait speed for better community ambulation and to reduce fall risk. Baseline: 1/30: 15 seconds with SPC  Goal status: INITIAL  4.  Patient will increase lower extremity functional scale to >60/80 to demonstrate improved functional mobility and increased tolerance with ADLs.  Baseline: 1/30: 5/80 Goal status: INITIAL  PLAN:  PT FREQUENCY: 2x/week PT DURATION: 8 weeks PLANNED INTERVENTIONS: 97164- PT Re-evaluation, 97110-Therapeutic exercises, 97530- Therapeutic activity, 97112- Neuromuscular re-education, 97535- Self Care, [id number]- Manual therapy, [id number]- Gait training, [phone]- Splinting, 97014- Electrical stimulation (unattended), [phone]- Electrical stimulation (manual), [id number]- Ultrasound, 21308- Traction (mechanical), Patient/Family education, Balance training, Stair training, Taping, Joint mobilization, Joint manipulation, Spinal manipulation, Spinal mobilization, Manual lymph drainage, Scar mobilization, Compression bandaging, Vestibular training, Visual/preceptual remediation/compensation, DME instructions, Cryotherapy, and Moist heat  PLAN FOR NEXT SESSION:  R hip strength and ROM   11:39 AM, 01/19/24    Judithann Sauger, OT 01/19/2024, 11:39 AM

## 2024-01-23 ENCOUNTER — Ambulatory Visit: Admitting: Occupational Therapy

## 2024-01-23 DIAGNOSIS — I890 Lymphedema, not elsewhere classified: Secondary | ICD-10-CM

## 2024-01-23 NOTE — Therapy (Signed)
OUTPATIENT OCCUPATIONAL THERAPY TREATMENT NOTE   LOWER EXTREMITY LYMPHEDEMA  Patient Name: Elizabeth Martin MRN: [id number] DOB:11/07/1961, 63 y.o., female Today's Date: 01/23/2024   END OF SESSION:  Lymphedema Episode 2   OT End of Session - 01/23/24 0912     Visit Number 14    Number of Visits 36    Date for OT Re-Evaluation 04/16/24    OT Start Time 0905    OT Stop Time 1010    OT Time Calculation (min) 65 min    Activity Tolerance Patient tolerated treatment well   L hip pain with bed  mobility and repositioning   Behavior During Therapy [facility] for tasks assessed/performed              Past Medical History:  Diagnosis Date   Anxiety    Asthma    Autoimmune disease (HCC)    Cancer (HCC)    Endometrial lymph node removal (30)   Corneal abrasion, right 05/24/2022   Depression    GERD (gastroesophageal reflux disease)    H/O blood clots    upper rt groin and behind both knees   History of hiatal hernia    Hypertension    Lymphedema    right leg   Morphea scleroderma    PONV (postoperative nausea and vomiting)    states gets violently ill   Past Surgical History:  Procedure Laterality Date   BASAL CELL CARCINOMA EXCISION Left    CHOLECYSTECTOMY  2008   FLEXIBLE SIGMOIDOSCOPY N/A 05/19/2022   Procedure: FLEXIBLE SIGMOIDOSCOPY;  Surgeon: Toledo, Boykin Nearing, MD;  Location: [facility] ENDOSCOPY;  Service: Gastroenterology;  Laterality: N/A;   HERNIA REPAIR  2004   ILEOSTOMY CLOSURE N/A 08/24/2022   Procedure: ILEOSTOMY TAKEDOWN, open loop with Lynden Oxford, PA-C to assist;  Surgeon: Henrene Dodge, MD;  Location: [facility] ORS;  Service: General;  Laterality: N/A;   IVC FILTER INSERTION N/A 11/09/2023   Procedure: IVC FILTER INSERTION;  Surgeon: Annice Needy, MD;  Location: [facility] INVASIVE CV LAB;  Service: Cardiovascular;  Laterality: N/A;   IVC FILTER REMOVAL N/A 01/02/2024   Procedure: IVC FILTER REMOVAL;  Surgeon: Annice Needy, MD;  Location: [facility] INVASIVE CV LAB;   Service: Cardiovascular;  Laterality: N/A;   PLANTAR FASCIECTOMY     ROBOTIC ASSISTED TOTAL HYSTERECTOMY WITH BILATERAL SALPINGO OOPHERECTOMY  02/14/2012   ROTATOR CUFF REPAIR Left 03/24/2022   TONSILLECTOMY     TOTAL HIP ARTHROPLASTY Right 11/14/2023   Procedure: TOTAL HIP ARTHROPLASTY - posterior;  Surgeon: Reinaldo Berber, MD;  Location: [facility] ORS;  Service: Orthopedics;  Laterality: Right;   XI ROBOTIC ASSISTED LOWER ANTERIOR RESECTION N/A 05/24/2022   Procedure: XI ROBOTIC ASSISTED LOWER ANTERIOR RESECTION;  Surgeon: Henrene Dodge, MD;  Location: [facility] ORS;  Service: General;  Laterality: N/A;   Patient Active Problem List   Diagnosis Date Noted   Constipation 01/10/2024   Nausea & vomiting 01/10/2024   S/P total right hip arthroplasty 11/14/2023   Blood coagulation disorder (HCC) 11/10/2023   PTSD (post-traumatic stress disorder) 11/10/2023   Severe episode of recurrent major depressive disorder, without psychotic features (HCC) 11/10/2023   GAD (generalized anxiety disorder) 11/10/2023   High risk medication use 11/10/2023   Anxiety and depression 03/22/2023   Cervical radiculopathy 03/16/2023   Colon cancer screening 03/15/2023   Chronic idiopathic constipation 03/15/2023   Dyspnea 03/07/2023   [facility] spotted fever 03/07/2023   Strain of gastrocnemius tendon 03/07/2023   Lower abdominal pain 03/07/2023   Pain in  pelvis 03/07/2023   Small bowel obstruction (HCC) 02/11/2023   Raynaud disease 02/11/2023   Polyarthralgia 02/11/2023   Edema 01/03/2023   Diverticular disease 12/10/2022   Hot flashes 12/10/2022   Pain of right lower extremity 12/10/2022   Paresthesia of lower extremity 12/10/2022   Primary insomnia 12/10/2022   Thyroid nodule 12/10/2022   Multiple joint pain 12/10/2022   Basal cell carcinoma of skin 12/10/2022   Acute bilateral low back pain without sciatica 11/10/2022   Chronic lower back pain 09/05/2022   S/P closure of ileostomy 08/24/2022    Ileostomy in place [facility])    Morphea 07/13/2022   Colonic stricture (HCC) 05/19/2022   Large bowel obstruction (HCC) 05/19/2022   Hypokalemia 05/18/2022   Abdominal pain 05/18/2022   Osteoarthritis of left knee 02/24/2022   Mixed connective tissue disease (HCC) 02/17/2022   Adhesive capsulitis of left shoulder 01/27/2022   Glenohumeral arthritis, left 01/27/2022   Microscopic hematuria 01/12/2022   Otorrhagia of right ear 12/10/2021   Elevated testosterone level in female 11/04/2021   Anti-TPO antibodies present 10/20/2021   Rash 10/13/2021   Hashimoto thyroiditis, 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   Hashimoto's disease 03/23/2021   Exposure to severe acute respiratory syndrome coronavirus 2 (SARS-CoV-2) 12/19/2020   Migraine without aura and without status migrainosus, not intractable 08/19/2020   Myofascial pain 07/30/2020   Neck pain 07/30/2020   Upper back pain 07/30/2020   Cervical facet joint syndrome 07/30/2020   Hair loss 07/14/2020   Edema of lower extremity 07/11/2020   Adenomatous colon polyp 02/26/2020   Gastroesophageal reflux disease 02/15/2020   Diarrhea 02/15/2020   Sleep disorder 01/16/2020   Hyperlipidemia 07/11/2019   Right shoulder pain 04/04/2019   Pre-operative clearance 12/13/2018   Endometrial cancer (HCC) 04/05/2018   Patellofemoral pain syndrome of right knee 03/20/2018   Gastroenteritis 12/07/2017   Fatigue 11/16/2017   Morbid obesity (HCC) 08/11/2017   Stucco keratoses 08/11/2017   Essential hypertension 08/09/2017   History of endometrial cancer 08/09/2017   Lymphedema 08/09/2017   Menopause 08/09/2017   Malignant neoplastic disease (HCC) 04/07/2017   Cellulitis 09/18/2016   Benign hypertension 06/02/2016   Asthma 06/04/1969    PCP: Pennie Banter, MD  REFERRING PROVIDER: Pennie Banter, MD  REFERRING DIAG: I89.0   THERAPY DIAG:  Lymphedema, not elsewhere classified  Rationale for Evaluation and Treatment: Rehabilitation  ONSET DATE: 2013   (Cancer-related, endometrial 2013)  SUBJECTIVE:                                                                                                                                                                                           SUBJECTIVE  STATEMENT:Zamariya Strohecker presents to OT for lymphedema care to her RLE/RLQ. Patient presents to s/p R hip arthroplasty 11/14/23. Pt does not rate lymphedema related pain numerically. She rates R hip pain as 3/10. Pt states she did try on her compression garment recently, but it is too tight. Pt is hopeful that she'll be able to fit back into garment she used before hip replacement so she can wear it, instead of  bandages, during her upcoming [place] trip.   PERTINENT HISTORY: Asthma, Autoimmune Disease, Endometrial Ca w/ LND ( 30 bilateral pelvic and periaortic LN), adjuvant chemotherapy and XRT,   H/O blood clots, RLE/RLQ lymphedema, Robotic assisted total hysterectomy w/ bilateral oophorectomy L Rotator Cuff repair, S/P ileostomy 2/2 colon stricture 05/2022 R hip arthroplasty scheduled 11/11/23.   PAIN:  Are you having pain? RLE lymphedema, Yes, not rated: NPRS scale: R HIP  4/10 Pain location: RLE, including hip Pain description: aching, heavy, full, tight Aggravating factors: standing, walking, extended dependent sitting Relieving factors: elevation, movement  PRECAUTIONS: Fall and Other: LYMPHEDEMA  WEIGHT BEARING RESTRICTIONS: Yes 5# lifting restriction- shoulder  FALLS:  Has patient fallen in last 6 months? No  LIVING ENVIRONMENT: Lives with: lives with their spouse Lives in: House/apartment Stairs: No;  Has following equipment at home: Single point cane, Environmental consultant - 2 wheeled, Environmental consultant - 4 wheeled, and Wheelchair (manual)  OCCUPATION: retired Control and instrumentation engineer: gardening, hiking, biking, dancing- unable to participate in any of these due to pain and swelling  HAND DOMINANCE: right   PRIOR LEVEL OF FUNCTION: Independent with basic ADLs,  Independent with household mobility without device, Independent with community mobility without device, Requires assistive device for independence, Needs assistance with homemaking, Needs assistance with transfers, and Leisure: decreased  social participation for leisure pursuits due to impaired mobility and pain  PATIENT GOALS:  Be able to move freely without pain Reduce limb volume to be able to lift limb for basic daily ADLs and functional ambulation Reduce limb volume to increase ability to perform lower body dressing and bathing and grooming Reduce limb to increase body image  OBJECTIVE: Note: Objective measures were completed at Evaluation unless otherwise noted.  COGNITION:  Overall cognitive status: Within functional limits for tasks assessed   OBSERVATIONS / OTHER ASSESSMENTS:   POSTURE: WFL  LE ROM: WFL, but Limited mildly at R knee and ankle due to girth, skin approximation, and joint pain  LE MMT: WFL  Mild, Stage  II, Bilateral Lower Extremity Lymphedema 2/2 CVI and Obesity  Skin  Description Hyper-Keratosis Peau d' Orange Shiny Tight Fibrotic/ Indurated Fatty Doughy Spongy/ boggy   x x x x R>L   x   Skin dry Flaky WNL Macerated   mildly sclerotic     Color Redness Varicosities Blanching Hemosiderin Stain Mottled    x x   x   Odor Malodorous Yeast Fungal infection  WNL      x   Temperature Warm Cool wnl    x     Pitting Edema   1+ 2+ 3+ 4+ Non-pitting         x   Girth Symmetrical Asymmetrical                   Distribution    R>L RLE toes to groin    Stemmer Sign Positive Negative   +    Lymphorrhea History Of:  Present Absent     x    Wounds History Of Present Absent Venous Arterial Pressure Sheer  x        Signs of Infection Redness Warmth Erythema Acute Swelling Drainage Borders                    Sensation Light Touch Deep pressure Hypersensitivity   In tact Impaired In tact Impaired Absent Impaired   x  x  x     Nails  WNL   Fungus nail dystrophy   x     Hair Growth Symmetrical Asymmetrical    R>L   Skin Creases Base of toes  Ankles   Base of Fingers knees       Abdominal pannus Thigh Lobules  Face/neck   x x  x        BLE COMPARATIVE LIMB VOLUMETRICS 10/10/23  LANDMARK RIGHT  10/10/23  R LEG (A-D) 5466.5 ml  R THIGH (E-G) 9186.6 ml  R FULL LIMB (A-G) 14653.1 ml  Limb Volume differential (LVD)  LVD for LEG = 44.1%, R>L LVD for THIGH= 33.98%, R>L LVD for FULL lower extremity 37.8%, R>L  Volume change since last 08/11/22 R LEG is INCREASED in volume by 59.6%. L THIGH is INCREASED by 38 %, and RLE full limb is INCREASED by 45.38%.  Volume change overall V  (Blank rows = not tested)  LANDMARK LEFT  10/12/23  L LEG (A-D) 3053.6 ml  L THIGH (E-G) 6064.8 ml  L  FULL LIMB (A-G) 9118.4 ml  Limb Volume differential (LVD)  %  Volume change since initial %  Volume change overall %  (Blank rows = not tested)   10 th VISIT PROGRESS NOTE RLE COMPARATIVE LIMB VOLUMETRICS 01/04/24: Deferred until next visit by Pt request.  TBA next session  LANDMARK RIGHT    R LEG (A-D) TBA  R THIGH (E-G) TBA  R FULL LIMB (A-G) TBA  Limb Volume differential (LVD)    Volume change since last 08/11/22 TBA  Volume change overall TBA  (Blank rows = not tested)    CA-related LYMPHEDEMA Hx:  SURGERY TYPE/DATE: 2013 NUMBER OF LYMPH NODES REMOVED: 30 by report- bilateral pelvic and periaortic CHEMOTHERAPY: yes RADIATION:yes INFECTIONS: no hx cellulitis GAIT: Distance walked: >500 ft Assistive device utilized: single point cane Level of assistance: Modified independence- extra time Comments: R limp  LYMPHEDEMA LIFE IMPACT SCALE (LLIS): Intake 10/12/23 75% (The extent to which lymphedema related problems impacted your life over the past week)  FOTO (functional outcome measure):10/10/23 INTAKE: 36%  PATIENT EDUCATION:  Education details: Continued Pt/ CG edu for lymphedema self care home program throughout session.  Topics include outcome of comparative limb volumetrics- starting limb volume differentials (LVDs), technology and gradient techniques used for short stretch, multilayer compression wrapping, simple self-MLD, therapeutic lymphatic pumping exercises, skin/nail care, LE precautions,. compression garment recommendations and specifications, wear and care schedule and compression garment donning / doffing w assistive devices. Discussed progress towards all OT goals since commencing CDT. All questions answered to the Pt's satisfaction. Good return. Person educated: Patient Education method: Explanation, Demonstration, and Handouts Education comprehension: verbalized understanding, returned demonstration, and needs further education  LE SELF-CARE HOME  PROGRAM: Simple self-MLD/daily to affected quadrant and body part At least 2 x daily BLE Lymphatic Pumping There ex 1 set of 10 reps each, in order, bilaterally Daily skin care to affected body part to limit infection risk and increase skin excursion Compression Bandaging Intensive stage compression: multilayer short stretch wraps with gradient techniques. One limb at a time. Length patient dependent. Self-management Phase: Appropriate daytime compression garment and  hours-of-sleep device  Compression garments: Custom-made gradient compression garments and hours-of-sleep devices are medically necessary because they are uniquely sized and shaped to fit the exact dimensions of the affected extremities and to provide accurate and consistent gradient compression and containment, essential  for optimally managing chronic, progressive lymphedema. The convoluted HOS devices are medically necessary to facilitate increased lymphatic circulation and limit fibrosis formation when sleeping. Multiple custom compression garments are needed for optimal hygiene to limit infection risk. Custom compression garments should be replaced q 3-6 months When worn consistently for  optimal lipo-lymphedema self-management over time.  Pt will benefit from [id number]- Circaid reduction kit whole leg to increase independence with lymphedema self care, to  reduce limb volume  for body symmetry and balance, and to limit infection risk and progression.  ASSESSMENT:  CLINICAL IMPRESSION: Emphasis of visit on manual lymphatic drainage in supine and side lying, including fibrosis techniques to leg and foot towards ipsilateral axillary lymph nodes, functional inguinal nodes,  and deep abdominal lymphatics.  Applied full limb  multilayer, gradient compression wraps  after manual therapy from base of toes to groin. Cont as per POC to assist with healing, to reduce limb swelling and progression of tissue hardening due to both lymphedema and auto immune issues, and to assist with normalizing functional ambulation and mobility. Priority is reducing swelling to enable Pt to resume wearing custom compression garments ASAP to reduce bulky wraps.   10/10/23 Lymphedema Episode 2, Initial OT Evaluation : Hope Brandenburger is a 16 y a female presenting with chronic, progressive, moderate, Stage II, RLE/RLQ cancer -related lymphedema with onset 11 years ago after Rx for endometrial cancer. Pt is well known to this therapist as she has successfully undergone Complete Decongestive Therapy (CDT)  this clinic bin the past. Pt reports recent exacerbation of RLE swelling worsened as inflammation in L hip has worsened over time.  Pt has a hip replacement scheduled in late December here it [facility]. RE limb volumetrics today reveal that R LEG volume is dramatically increased by 59.65 since last visit. R LEG VOLUME is INCREASED in volume by 59.6%. L THIGH is INCREASED by 38 %, and RLE full limb is INCREASED by 45.38%. Pt presents with LE-related skin changes. Prolonged inflammation in the R hip joint has likely overloaded  already RLE/RLQ lymphatics.  RLE/RLQ lymphedema limits Pt's ability to perform basic and instrumental  ADLs, including functional ambulation, mobility and transfers, grooming , lower body bathing and dressing, skin inspection and skin care. Pt has difficulty  reaching her lower legs and feet to apply compression wraps and don/doff        compression stockings. LE limits ability to P[perform instrumental ADLs, including driving, yard work and home management activities. Lymphedema limits her ability to participate in leisure pursuits and productive activities, and it negatively impacts body image, life roles and quality of life.   Pt will benefit from an Intensive and follow along course of lymphedema. CDT will consist of Manual Lymphatic gainage   (MLD), skin care, therapeutic exercise and compression, wraps initially then garments. Pt will need assistance throughout CDT   for applying compression wraps due to limited hip AROM and decreased skin flexibility. Without skilled Occupational Therapy for lymphedema care, lymphedema will progress and further functional decline is expected.   In prep for upcoming hip replacement OT will educate Pt re assistive devices, including tub transfer bench and elevated toilet seat, in keeping with hip precautions.  OBJECTIVE IMPAIRMENTS: Abnormal gait, decreased activity tolerance, decreased balance, decreased  knowledge of use of DME, decreased mobility, difficulty walking, decreased ROM, decreased strength, increased edema, decreased skin flexibility, increased fascial restrictions, impaired sensation, pain, and chronic, progressive LLE/LLQ lymphatic swelling and associated pain.   ADL LIMITATIONS: carrying, lifting, bending, sitting, standing, squatting, sleeping, stairs, transfers, bed mobility, bathing, dressing, hygiene/grooming, and productive activities, leisure pursuits, social participation, body image  driving, shopping, housework, yard work, cooking, meal prep  PERSONAL FACTORS: Past/current experiences and 3+ comorbidities: Morphea Scleroderma, Mixed connective  tissue disease, and osteoarthritis  are also affecting patient's functional outcome.   REHAB POTENTIAL: Good  EVALUATION COMPLEXITY: Moderate  GOALS: Goals reviewed with patient? Yes  SHORT TERM GOALS: Target date: 4th OT Rx visit   Pt will demonstrate understanding of lymphedema precautions and prevention strategies with modified independence using a printed reference to identify at least 5 precautions and discussing how s/he may implement them into daily life to reduce risk of progression with extra time.  Baseline:Max A Goal status: GOAL MET  2.  Pt will be able to apply multilayer, knee length, gradient, compression wraps to one leg at a time with modified assistance (extra time and assistive device/s) to decrease limb volume, to limit infection risk, and to limit lymphedema progression.  Baseline: Dependent Goal status: GOAL MET  LONG TERM GOALS: Target date: 01/09/24 (12 weeks)  Given this patient's Intake score 36% on the functional outcomes FOTO tool, patient will experience an increase in function of 3 points to improve basic and instrumental ADLs performance, including lymphedema self-care.  Baseline: Max A Goal status: DEFERRED as Janyth Contes has been discontinued  2.  Given this patient's Intake score of  75% on the Lymphedema Life Impact Scale (LLIS), patient will experience a reduction of at least 5 points in her perceived level of functional impairment resulting from lymphedema to improve functional performance and quality of life (QOL). Baseline: % Goal status: PROGRESSING  3.  Pt will achieve at least a 10% volume reduction in full, RLE limb volume to return limb to typical size and shape, to limit infection risk and LE progression, to decrease pain, to improve function. Baseline: Dependent Goal status:PROGRESSING. Volumetrics deferred today by Pt request in order to increase time for manual therapy which provides pain relief. Volumetrics next visit. By visual assessment I would  estimate ~10% volume reduction for entire RLE to DATE. tissue DENSITY IN THIGH HAS REDUCED MILDLY AND SLIGHTLY IN LEG.   4.  Pt will obtain proper compression garments/devices and achieve modified independence (extra time + assistive devices) with donning/doffing to optimize limb volume reductions and limit LE  progression over time. Baseline:  Goal status: 01/04/24: PARTIALLY MET; In an effort to reduce burden of care on her spouse, Pt obtained adjustable, Velcro style R full leg, Circaid leg reduction kit for use s/p THA, but this proved not effective for controlling swelling, and were not easier to don and doff more independently. We've resumed wrapping until she is able to fit back into custom compression garments.  5.  During Intensive phase CDT , with modified independence, Pt will achieve at least 85% compliance with all lymphedema self-care home program components, including daily skin care, compression wraps and /or garments, simple self MLD and lymphatic pumping therex to habituate LE self care protocol  into ADLs for optimal LE self-management over time. Baseline: Dependent Goal status: ONGOING PLAN:  OT FREQUENCY: 2x/week  OT DURATION: 12 weeks and PRN  PLANNED INTERVENTIONS: Complete Decongestive Therapy (Intensive and supported Self-Management Phases), 97110-Therapeutic exercises, 97530- Therapeutic  activity, 97535- Self Care, 16109- Manual therapy, Patient/Family education, Taping, Manual lymph drainage, Scar mobilization, Compression bandaging, DME instructions, and skin care to reduce infection risk throughout manual therapy. Fit with replacement compression garments ASAP  PLAN FOR NEXT SESSION:  Complete RLE comparative limb volumetrics for progress note update Cont RLE/RLQ MLD Cont multilayer compression wrapping    Loel Dubonnet, MS, OTR/L, CLT-LANA 01/23/24 12:48 PM  01/23/2024, 12:48 PM

## 2024-01-24 ENCOUNTER — Ambulatory Visit (INDEPENDENT_AMBULATORY_CARE_PROVIDER_SITE_OTHER): Admitting: Licensed Clinical Social Worker

## 2024-01-24 DIAGNOSIS — F332 Major depressive disorder, recurrent severe without psychotic features: Secondary | ICD-10-CM

## 2024-01-24 DIAGNOSIS — F411 Generalized anxiety disorder: Secondary | ICD-10-CM

## 2024-01-24 DIAGNOSIS — F4310 Post-traumatic stress disorder, unspecified: Secondary | ICD-10-CM

## 2024-01-24 NOTE — Progress Notes (Signed)
THERAPIST PROGRESS NOTE  Session Time: 9:02am-10:04am  Participation Level: Active  Behavioral Response: CasualAlertEuthymic  Type of Therapy: Individual Therapy  Treatment Goals addressed:  Goal: LTG: Elimination of maladaptive behaviors and thinking patterns which interfere with resolution of trauma as evidenced by self report     Dates: Start:  11/25/23    Expected End:  04/24/24       Disciplines: Interdisciplinary, PROVIDER                 Goal: LTG: Develop and implement effective coping skills to carry out normal responsibilities and participate constructively in relationships as evidenced by self report     Dates: Start:  11/25/23    Expected End:  04/24/24       Disciplines: Interdisciplinary, PROVIDER                Goal: LTG: Recall traumatic events without becoming overwhelmed with negative emotions     Dates: Start:  11/25/23    Expected End:  04/24/24       Disciplines: Interdisciplinary, PROVIDER                Goal: LTG: Pt reports "think about these things in the past without having such an impact on my life."        ProgressTowards Goals: Progressing  Interventions: Supportive, Assertive Communication, Solution Focused, Inner Child Work   Summary: Monya Kozakiewicz is a 63 y.o. female who presents with symptoms of depression and trauma. Patient identifies symptoms to include reexperiencing, uncontrollable worry, and hypervigilance. Pt was oriented times 5. Pt was cooperative and engaged. Pt denies SI/HI/AVH.    Patient began session processing recent EMDR session citing discovery of a new triggers/new memory.  Patient reflected on new memory and realizations.   The patient utilized therapeutic space to process patterns of self-doubt and feelings of loneliness stemming from conflicts with friends. She reflected on the concept of "closure" and how comments made by a particular peer had bothered her since their encounter. The clinician helped the patient draw  connections between her emotional triggers, particularly the incident where this peer raised their voice toward her, which reminded her of interactions with her father. Discussed how the connection between these two behaviors affects both the patient and her inner child. The patient became tearful as she realized that she might not receive the closure she expected. The clinician assigned her homework to write letters to both her father and friend, clarifying her feelings.  Closed out session with mindfulness activity to assist in returning to baseline.     Suicidal/Homicidal: Nowithout intent/plan  Therapist Response: The clinician used active and supportive reflection to establish a safe space for the patient to discuss recent life stressors. Assessed the patient's current symptoms, stressors, and safety since the last session.  Clinician continue to review assertive communication through solutions and ways in which patient can establish healthy boundaries and work towards closure.  Reference how social situations have impacted patient's and her child and reflected on trauma history.   Plan: Return again in 1 week.  Diagnosis: PTSD (post-traumatic stress disorder)  Severe episode of recurrent major depressive disorder, without psychotic features (HCC)  GAD (generalized anxiety disorder)   Collaboration of Care: AEB psychiatrist can access notes and cln. Will review psychiatrists' notes. Check in with the patient and will see LCSW per availability. Patient agreed with treatment recommendations.   Patient/Guardian was advised Release of Information must be obtained prior to any record release in order  to collaborate their care with an outside provider. Patient/Guardian was advised if they have not already done so to contact the registration department to sign all necessary forms in order for [place] to release information regarding their care.   Consent: Patient/Guardian gives verbal consent for  treatment and assignment of benefits for services provided during this visit. Patient/Guardian expressed understanding and agreed to proceed.   Dereck Leep, LCSW 01/24/2024

## 2024-01-26 ENCOUNTER — Ambulatory Visit: Admitting: Occupational Therapy

## 2024-01-26 ENCOUNTER — Ambulatory Visit

## 2024-01-30 ENCOUNTER — Ambulatory Visit: Admitting: Occupational Therapy

## 2024-01-30 ENCOUNTER — Ambulatory Visit: Admitting: Physical Therapy

## 2024-01-30 DIAGNOSIS — I890 Lymphedema, not elsewhere classified: Secondary | ICD-10-CM

## 2024-01-30 DIAGNOSIS — M25551 Pain in right hip: Secondary | ICD-10-CM

## 2024-01-30 DIAGNOSIS — R262 Difficulty in walking, not elsewhere classified: Secondary | ICD-10-CM

## 2024-01-30 DIAGNOSIS — M25651 Stiffness of right hip, not elsewhere classified: Secondary | ICD-10-CM

## 2024-01-30 NOTE — Therapy (Deleted)
 OUTPATIENT PHYSICAL THERAPY TREATMENT   Patient Name: Elizabeth Martin MRN: [id number] DOB:09-Jan-1961, 63 y.o., female Today's Date: 01/30/2024  END OF SESSION:    OT End of Session - 01/30/24 0904     Visit Number 15    Number of Visits 36    Date for OT Re-Evaluation 04/16/24    OT Start Time 0900    OT Stop Time 1005    OT Time Calculation (min) 65 min    Activity Tolerance Patient tolerated treatment well   L hip pain with bed  mobility and repositioning   Behavior During Therapy WFL for tasks assessed/performed                Past Medical History:  Diagnosis Date   Anxiety    Asthma    Autoimmune disease (HCC)    Cancer (HCC)    Endometrial lymph node removal (30)   Corneal abrasion, right 05/24/2022   Depression    GERD (gastroesophageal reflux disease)    H/O blood clots    upper rt groin and behind both knees   History of hiatal hernia    Hypertension    Lymphedema    right leg   Morphea scleroderma    PONV (postoperative nausea and vomiting)    states gets violently ill   Past Surgical History:  Procedure Laterality Date   BASAL CELL CARCINOMA EXCISION Left    CHOLECYSTECTOMY  2008   FLEXIBLE SIGMOIDOSCOPY N/A 05/19/2022   Procedure: FLEXIBLE SIGMOIDOSCOPY;  Surgeon: Toledo, Boykin Nearing, MD;  Location: [facility];  Service: Gastroenterology;  Laterality: N/A;   HERNIA REPAIR  2004   ILEOSTOMY CLOSURE N/A 08/24/2022   Procedure: ILEOSTOMY TAKEDOWN, open loop with Lynden Oxford, PA-C to assist;  Surgeon: Henrene Dodge, MD;  Location: [facility] OR[facility];  Service: General;  Laterality: N/A;   IVC FILTER INSERTION N/A 11/09/2023   Procedure: IVC FILTER INSERTION;  Surgeon: Annice Needy, MD;  Location: [facility];  Service: Cardiovascular;  Laterality: N/A;   IVC FILTER REMOVAL N/A 01/02/2024   Procedure: IVC FILTER REMOVAL;  Surgeon: Annice Needy, MD;  Location: [facility];  Service: Cardiovascular;  Laterality: N/A;   PLANTAR  FASCIECTOMY     ROBOTIC ASSISTED TOTAL HYSTERECTOMY WITH BILATERAL SALPINGO OOPHERECTOMY  02/14/2012   ROTATOR CUFF REPAIR Left 03/24/2022   TONSILLECTOMY     TOTAL HIP ARTHROPLASTY Right 11/14/2023   Procedure: TOTAL HIP ARTHROPLASTY - posterior;  Surgeon: Reinaldo Berber, MD;  Location: [facility] ORS;  Service: Orthopedics;  Laterality: Right;   XI ROBOTIC ASSISTED LOWER ANTERIOR RESECTION N/A 05/24/2022   Procedure: XI ROBOTIC ASSISTED LOWER ANTERIOR RESECTION;  Surgeon: Henrene Dodge, MD;  Location: [facility] ORS;  Service: General;  Laterality: N/A;   Patient Active Problem List   Diagnosis Date Noted   Constipation 01/10/2024   Nausea & vomiting 01/10/2024   S/P total right hip arthroplasty 11/14/2023   Blood coagulation disorder (HCC) 11/10/2023   PTSD (post-traumatic stress disorder) 11/10/2023   Severe episode of recurrent major depressive disorder, without psychotic features (HCC) 11/10/2023   GAD (generalized anxiety disorder) 11/10/2023   High risk medication use 11/10/2023   Anxiety and depression 03/22/2023   Cervical radiculopathy 03/16/2023   Colon cancer screening 03/15/2023   Chronic idiopathic constipation 03/15/2023   Dyspnea 03/07/2023   [facility] spotted fever 03/07/2023   Strain of gastrocnemius tendon 03/07/2023   Lower abdominal pain 03/07/2023   Pain in pelvis 03/07/2023   Small bowel obstruction (  HCC) 02/11/2023   Raynaud disease 02/11/2023   Polyarthralgia 02/11/2023   Edema 01/03/2023   Diverticular disease 12/10/2022   Hot flashes 12/10/2022   Pain of right lower extremity 12/10/2022   Paresthesia of lower extremity 12/10/2022   Primary insomnia 12/10/2022   Thyroid nodule 12/10/2022   Multiple joint pain 12/10/2022   Basal cell carcinoma of skin 12/10/2022   Acute bilateral low back pain without sciatica 11/10/2022   Chronic lower back pain 09/05/2022   S/P closure of ileostomy 08/24/2022   Ileostomy in place [facility])    Morphea 07/13/2022   Colonic  stricture (HCC) 05/19/2022   Large bowel obstruction (HCC) 05/19/2022   Hypokalemia 05/18/2022   Abdominal pain 05/18/2022   Osteoarthritis of left knee 02/24/2022   Mixed connective tissue disease (HCC) 02/17/2022   Adhesive capsulitis of left shoulder 01/27/2022   Glenohumeral arthritis, left 01/27/2022   Microscopic hematuria 01/12/2022   Otorrhagia of right ear 12/10/2021   Elevated testosterone level in female 11/04/2021   Anti-TPO antibodies present 10/20/2021   Rash 10/13/2021   Hashimoto thyroiditis, 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   Hashimoto's disease 03/23/2021   Exposure to severe acute respiratory syndrome coronavirus 2 (SARS-CoV-2) 12/19/2020   Migraine without aura and without status migrainosus, not intractable 08/19/2020   Myofascial pain 07/30/2020   Neck pain 07/30/2020   Upper back pain 07/30/2020   Cervical facet joint syndrome 07/30/2020   Hair loss 07/14/2020   Edema of lower extremity 07/11/2020   Adenomatous colon polyp 02/26/2020   Gastroesophageal reflux disease 02/15/2020   Diarrhea 02/15/2020   Sleep disorder 01/16/2020   Hyperlipidemia 07/11/2019   Right shoulder pain 04/04/2019   Pre-operative clearance 12/13/2018   Endometrial cancer (HCC) 04/05/2018   Patellofemoral pain syndrome of right knee 03/20/2018   Gastroenteritis 12/07/2017   Fatigue 11/16/2017   Morbid obesity (HCC) 08/11/2017   Stucco keratoses 08/11/2017   Essential hypertension 08/09/2017   History of endometrial cancer 08/09/2017   Lymphedema 08/09/2017   Menopause 08/09/2017   Malignant neoplastic disease (HCC) 04/07/2017   Cellulitis 09/18/2016   Benign hypertension 06/02/2016   Asthma 06/04/1969    PCP: Pennie Banter MD   REFERRING PROVIDER: Pennie Banter MD   REFERRING DIAG: s/p R hip replacement  THERAPY DIAG:  Lymphedema, not elsewhere classified  Rationale for Evaluation and Treatment: Rehabilitation  ONSET DATE: 11/14/23  SUBJECTIVE:   SUBJECTIVE  STATEMENT: Pt reports she has continued soreness but has also been progressing her walking outside of PT. She is feeling better about her trip to [place] in a month.   PERTINENT HISTORY: s/p R hip arthropaslasty 11/14/23 with Dr. Audelia Acton posterior approach, blood coagulation disorder, PTSD, anxiety, cervical radiculopathy dypsnea, polyarthralgia, Raynaud disease, s/p closure of ileostomy, Hashimoto thyroid, endometrial cancer, menopause, endometrial cancer, lymphedema. Patient had home health PT prior to PT here.   PAIN:  Are you having pain?  4/10 Rt hip joint pain, posterior portion of hip.    PRECAUTIONS: Other: recovering from hip sx  RED FLAGS: None   WEIGHT BEARING RESTRICTIONS: No  FALLS:  Has patient fallen in last 6 months? Yes. Number of falls 2  LIVING ENVIRONMENT: Lives with: lives with their spouse Lives in: House/apartment Stairs: No Has following equipment at home: Single point cane  OCCUPATION: retired,   PLOF: Independent  PATIENT GOALS: return to hiking, working out, gardening.   NEXT MD VISIT: March 2025   OBJECTIVE:  Note: Objective measures were completed at Evaluation unless otherwise noted.  DIAGNOSTIC FINDINGS:  IMPRESSION: Right hip replacement.  No visible complicating feature.  PATIENT SURVEYS:  LEFS 5/80  COGNITION: Overall cognitive status: Within functional limits for tasks assessed    MUSCLE LENGTH: Hamstrings: limited bilaterally with R>L   LOWER EXTREMITY ROM:  Passive ROM Right eval Left eval  Hip flexion 48 flexion PROM supine   Hip extension    Hip abduction 5 degrees AROM     (Blank rows = not tested)  LOWER EXTREMITY MMT:  MMT Right eval Left eval  Hip flexion 2+ 5  Hip extension  5  Hip abduction 2+* painful 5  Hip adduction 2+ 5  Hip internal rotation    Hip external rotation    Knee flexion 3 5  Knee extension 3 5  Ankle dorsiflexion 3 5  Ankle plantarflexion 3 5   (Blank rows = not  tested)    FUNCTIONAL TESTS:  5 times sit to stand: 29 6 minute walk test:  10 meter walk test: 15 seconds with SPC   GAIT: Distance walked: 60 ft Assistive device utilized: Single point cane Level of assistance: CGA Comments: antalgic gait pattern with decreased stance time RLE. Slight vaulting pattern.  Stair negotiation: -step to pattern lead with LLE, one hand on cane one hand on rail ascending. Descending step to pattern down with RLE.                                                                                                                               TREATMENT DATE: 01/30/24  TE- To improve strength, endurance, mobility, and function of specific targeted muscle groups or improve joint range of motion or improve muscle flexibility  Nustep for AAROM, progressed speed throughout, x 6 min   In // bars March walk x 2 laps Hamstring curl walk x 2 laps  Sidestepping x 2 laps Ad lib UE support for all of the above.   Hamstring stretch 30 seconds each LE  adduction squeeze 10x  TherAct: 4" step toe taps 10x each LE   Seated slider flexion/extension with focus on coordination x15   Neuro Standing with CGA next to support surface:  Airex pad: static stand 30 seconds x 2 trials, noticeable trembling of ankles/LE's with fatigue and challenge to maintain stability Airex pad: horizontal head turns 30 seconds scanning room 10x ; cueing for arc of motion  Airex pad: vertical head turns 30 seconds, cueing for arc of motion, noticeable sway with upward gaze increasing demand on ankle righting reaction musculature Airex ZOX:WRUE task word game x 8 minutes    PATIENT EDUCATION:  Education details: goals, POC, HEP Person educated: Patient Education method: Explanation, Demonstration, Tactile cues, and Verbal cues Education comprehension: verbalized understanding, returned demonstration, verbal cues required, tactile cues required, and needs further education  HOME EXERCISE  PROGRAM: Access Code: [facility] URL: [email or web address] Date: 01/11/2024 Prepared by: Thresa Ross  Exercises - Side Stepping with Counter Support  - 1 x daily - 7  x weekly - 2 sets - 10 reps - 5 hold - Standing Hip Extension  - 1 x daily - 7 x weekly - 2 sets - 10 reps - 5 hold - Standing March with Counter Support  - 1 x daily - 7 x weekly - 2 sets - 10 reps - 5 hold - Standing Hip Abduction with Counter Support  - 1 x daily - 7 x weekly - 2 sets - 10 reps - Seated Hamstring Stretch  - 1 x daily - 7 x weekly - 2 sets - 2 reps - 30 hold - Sit to Stand  - 1 x daily - 7 x weekly - 2 sets - 10 reps - 5 hold - Supine Heel Slide  - 1 x daily - 7 x weekly - 2 sets - 6 reps - Supine Gluteal Sets  - 1 x daily - 7 x weekly - 2 sets - 10 reps  ASSESSMENT: CLINICAL IMPRESSION:  Patient session limited by late arrival. Patient has soreness in post surgical hip requiring intermittent rest breaks. Cues for muscle activation and weight shift tolerated well. Pt will continue to benefit from skilled physical therapy intervention to address impairments, improve QOL, and attain therapy goals.    OBJECTIVE IMPAIRMENTS: Abnormal gait, decreased activity tolerance, decreased balance, decreased coordination, decreased endurance, decreased mobility, difficulty walking, decreased ROM, decreased strength, hypomobility, increased edema, impaired perceived functional ability, impaired flexibility, improper body mechanics, postural dysfunction, and pain.   ACTIVITY LIMITATIONS: carrying, lifting, bending, sitting, standing, squatting, sleeping, stairs, transfers, bed mobility, bathing, toileting, dressing, reach over head, hygiene/grooming, locomotion level, and caring for others  PARTICIPATION LIMITATIONS: meal prep, cleaning, laundry, interpersonal relationship, driving, shopping, community activity, and yard work  PERSONAL FACTORS: Age, Past/current experiences, Time since onset of  injury/illness/exacerbation, and 3+ comorbidities: blood coagulation disorder, PTSD, anxiety, cervical radiculopathy dypsnea, polyarthralgia, Raynaud disease, s/p closure of ileostomy, Hashimoto thyroid, endometrial cancer, menopause, endometrial cancer, lymphedema  are also affecting patient's functional outcome.   REHAB POTENTIAL: Good  CLINICAL DECISION MAKING: Evolving/moderate complexity  EVALUATION COMPLEXITY: Moderate   GOALS: Goals reviewed with patient? Yes  SHORT TERM GOALS: Target date: 01/19/2024  Patient will be independent in home exercise program to improve strength/mobility for better functional independence with ADLs. Baseline:1/30: HEP given Goal status: INITIAL  LONG TERM GOALS: Target date: 03/01/2024  Patient (> 58 years old) will complete five times sit to stand test in < 10 seconds indicating an increased LE strength and improved balance. Baseline: 1/30: 29 seconds Goal status: INITIAL  2.  Patient will increase six minute walk test distance to >1000 for progression to community ambulator and improve gait ability Baseline: 430 ft in 5 min and required seated rest to end test due to fatigue, uses SPC Goal status: INITIAL  3.  Patient will increase 10 meter walk test to >1.56m/s as to improve gait speed for better community ambulation and to reduce fall risk. Baseline: 1/30: 15 seconds with SPC  Goal status: INITIAL  4.  Patient will increase lower extremity functional scale to >60/80 to demonstrate improved functional mobility and increased tolerance with ADLs.  Baseline: 1/30: 5/80 Goal status: INITIAL  PLAN:  PT FREQUENCY: 2x/week PT DURATION: 8 weeks PLANNED INTERVENTIONS: [id number]- PT Re-evaluation, 97110-Therapeutic exercises, 97530- Therapeutic activity, 97112- Neuromuscular re-education, 97535- Self Care, 19147- Manual therapy, [id number]- Gait training, [phone]- Splinting, 97014- Electrical stimulation (unattended), [id number]- Electrical stimulation (manual),  [id number]- Ultrasound, [id number]- Traction (mechanical), Patient/Family education, Balance training, Stair training, Taping, Joint mobilization, Joint  manipulation, Spinal manipulation, Spinal mobilization, Manual lymph drainage, Scar mobilization, Compression bandaging, Vestibular training, Visual/preceptual remediation/compensation, DME instructions, Cryotherapy, and Moist heat  PLAN FOR NEXT SESSION:  R hip strength and ROM   12:42 PM, 01/30/24    Judithann Sauger, OT 01/30/2024, 12:42 PM

## 2024-01-30 NOTE — Therapy (Signed)
 OUTPATIENT OCCUPATIONAL THERAPY TREATMENT NOTE   LOWER EXTREMITY LYMPHEDEMA  Patient Name: Elizabeth Martin MRN: [id number] DOB:11-11-1961, 62 y.o., female Today's Date: 01/30/2024   END OF SESSION:  Lymphedema Episode 2   OT End of Session - 01/30/24 1249     Visit Number 15    Number of Visits 36    Date for OT Re-Evaluation 04/16/24    OT Start Time 0905    Activity Tolerance Patient tolerated treatment well   L hip pain with bed  mobility and repositioning   Behavior During Therapy [facility] for tasks assessed/performed              Past Medical History:  Diagnosis Date   Anxiety    Asthma    Autoimmune disease (HCC)    Cancer (HCC)    Endometrial lymph node removal (30)   Corneal abrasion, right 05/24/2022   Depression    GERD (gastroesophageal reflux disease)    H/O blood clots    upper rt groin and behind both knees   History of hiatal hernia    Hypertension    Lymphedema    right leg   Morphea scleroderma    PONV (postoperative nausea and vomiting)    states gets violently ill   Past Surgical History:  Procedure Laterality Date   BASAL CELL CARCINOMA EXCISION Left    CHOLECYSTECTOMY  2008   FLEXIBLE SIGMOIDOSCOPY N/A 05/19/2022   Procedure: FLEXIBLE SIGMOIDOSCOPY;  Surgeon: Toledo, Boykin Nearing, MD;  Location: [facility] ENDOSCOPY;  Service: Gastroenterology;  Laterality: N/A;   HERNIA REPAIR  2004   ILEOSTOMY CLOSURE N/A 08/24/2022   Procedure: ILEOSTOMY TAKEDOWN, open loop with Lynden Oxford, PA-C to assist;  Surgeon: Henrene Dodge, MD;  Location: [facility] ORS;  Service: General;  Laterality: N/A;   IVC FILTER INSERTION N/A 11/09/2023   Procedure: IVC FILTER INSERTION;  Surgeon: Annice Needy, MD;  Location: [facility] INVASIVE CV LAB;  Service: Cardiovascular;  Laterality: N/A;   IVC FILTER REMOVAL N/A 01/02/2024   Procedure: IVC FILTER REMOVAL;  Surgeon: Annice Needy, MD;  Location: [facility] INVASIVE CV LAB;  Service: Cardiovascular;  Laterality: N/A;   PLANTAR FASCIECTOMY      ROBOTIC ASSISTED TOTAL HYSTERECTOMY WITH BILATERAL SALPINGO OOPHERECTOMY  02/14/2012   ROTATOR CUFF REPAIR Left 03/24/2022   TONSILLECTOMY     TOTAL HIP ARTHROPLASTY Right 11/14/2023   Procedure: TOTAL HIP ARTHROPLASTY - posterior;  Surgeon: Reinaldo Berber, MD;  Location: [facility] ORS;  Service: Orthopedics;  Laterality: Right;   XI ROBOTIC ASSISTED LOWER ANTERIOR RESECTION N/A 05/24/2022   Procedure: XI ROBOTIC ASSISTED LOWER ANTERIOR RESECTION;  Surgeon: Henrene Dodge, MD;  Location: [facility] ORS;  Service: General;  Laterality: N/A;   Patient Active Problem List   Diagnosis Date Noted   Constipation 01/10/2024   Nausea & vomiting 01/10/2024   S/P total right hip arthroplasty 11/14/2023   Blood coagulation disorder (HCC) 11/10/2023   PTSD (post-traumatic stress disorder) 11/10/2023   Severe episode of recurrent major depressive disorder, without psychotic features (HCC) 11/10/2023   GAD (generalized anxiety disorder) 11/10/2023   High risk medication use 11/10/2023   Anxiety and depression 03/22/2023   Cervical radiculopathy 03/16/2023   Colon cancer screening 03/15/2023   Chronic idiopathic constipation 03/15/2023   Dyspnea 03/07/2023   [facility] spotted fever 03/07/2023   Strain of gastrocnemius tendon 03/07/2023   Lower abdominal pain 03/07/2023   Pain in pelvis 03/07/2023   Small bowel obstruction (HCC) 02/11/2023   Raynaud disease 02/11/2023  Polyarthralgia 02/11/2023   Edema 01/03/2023   Diverticular disease 12/10/2022   Hot flashes 12/10/2022   Pain of right lower extremity 12/10/2022   Paresthesia of lower extremity 12/10/2022   Primary insomnia 12/10/2022   Thyroid nodule 12/10/2022   Multiple joint pain 12/10/2022   Basal cell carcinoma of skin 12/10/2022   Acute bilateral low back pain without sciatica 11/10/2022   Chronic lower back pain 09/05/2022   S/P closure of ileostomy 08/24/2022   Ileostomy in place [facility])    Morphea 07/13/2022   Colonic stricture  (HCC) 05/19/2022   Large bowel obstruction (HCC) 05/19/2022   Hypokalemia 05/18/2022   Abdominal pain 05/18/2022   Osteoarthritis of left knee 02/24/2022   Mixed connective tissue disease (HCC) 02/17/2022   Adhesive capsulitis of left shoulder 01/27/2022   Glenohumeral arthritis, left 01/27/2022   Microscopic hematuria 01/12/2022   Otorrhagia of right ear 12/10/2021   Elevated testosterone level in female 11/04/2021   Anti-TPO antibodies present 10/20/2021   Rash 10/13/2021   Hashimoto thyroiditis, 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   Hashimoto's disease 03/23/2021   Exposure to severe acute respiratory syndrome coronavirus 2 (SARS-CoV-2) 12/19/2020   Migraine without aura and without status migrainosus, not intractable 08/19/2020   Myofascial pain 07/30/2020   Neck pain 07/30/2020   Upper back pain 07/30/2020   Cervical facet joint syndrome 07/30/2020   Hair loss 07/14/2020   Edema of lower extremity 07/11/2020   Adenomatous colon polyp 02/26/2020   Gastroesophageal reflux disease 02/15/2020   Diarrhea 02/15/2020   Sleep disorder 01/16/2020   Hyperlipidemia 07/11/2019   Right shoulder pain 04/04/2019   Pre-operative clearance 12/13/2018   Endometrial cancer (HCC) 04/05/2018   Patellofemoral pain syndrome of right knee 03/20/2018   Gastroenteritis 12/07/2017   Fatigue 11/16/2017   Morbid obesity (HCC) 08/11/2017   Stucco keratoses 08/11/2017   Essential hypertension 08/09/2017   History of endometrial cancer 08/09/2017   Lymphedema 08/09/2017   Menopause 08/09/2017   Malignant neoplastic disease (HCC) 04/07/2017   Cellulitis 09/18/2016   Benign hypertension 06/02/2016   Asthma 06/04/1969    PCP: Pennie Banter, MD  REFERRING PROVIDER: Pennie Banter, MD  REFERRING DIAG: I89.0   THERAPY DIAG:  Lymphedema, not elsewhere classified  Rationale for Evaluation and Treatment: Rehabilitation  ONSET DATE: 2013  (Cancer-related, endometrial 2013)  SUBJECTIVE:                                                                                                                                                                                            SUBJECTIVE STATEMENT:Patient presents to s/p R hip arthroplasty 11/14/23. Pt presents to OT for cancer-related lymphedema care  to RLE/RLQ after exacerbation following THA. Pt is very motivated to improve lymphedema prior to her upcoming , long awaited trip to [place] with her spouse.Pt states, "I feel like I have turned the corner". She reports soreness in the R hip and tightness in the inguinal region after PT. Pt forgot to bring her compression wraps to clinic this morning, so we were unable to apply them after session.   PERTINENT HISTORY: Asthma, Autoimmune Disease, Endometrial Ca w/ LND ( 30 bilateral pelvic and periaortic LN), adjuvant chemotherapy and XRT,   H/O blood clots, RLE/RLQ lymphedema, Robotic assisted total hysterectomy w/ bilateral oophorectomy L Rotator Cuff repair, S/P ileostomy 2/2 colon stricture 05/2022 R hip arthroplasty scheduled 11/11/23.   PAIN:  Are you having pain? RLE lymphedema, Yes, not rated: NPRS scale: R HIP  4/10 Pain location: RLE, including hip Pain description: aching, heavy, full, tight Aggravating factors: standing, walking, extended dependent sitting Relieving factors: elevation, movement  PRECAUTIONS: Fall and Other: LYMPHEDEMA  WEIGHT BEARING RESTRICTIONS: Yes 5# lifting restriction- shoulder  FALLS:  Has patient fallen in last 6 months? No  LIVING ENVIRONMENT: Lives with: lives with their spouse Lives in: House/apartment Stairs: No;  Has following equipment at home: Single point cane, Environmental consultant - 2 wheeled, Environmental consultant - 4 wheeled, and Wheelchair (manual)  OCCUPATION: retired Control and instrumentation engineer: gardening, hiking, biking, dancing- unable to participate in any of these due to pain and swelling  HAND DOMINANCE: right   PRIOR LEVEL OF FUNCTION: Independent with basic ADLs, Independent with  household mobility without device, Independent with community mobility without device, Requires assistive device for independence, Needs assistance with homemaking, Needs assistance with transfers, and Leisure: decreased  social participation for leisure pursuits due to impaired mobility and pain  PATIENT GOALS:  Be able to move freely without pain Reduce limb volume to be able to lift limb for basic daily ADLs and functional ambulation Reduce limb volume to increase ability to perform lower body dressing and bathing and grooming Reduce limb to increase body image  OBJECTIVE: Note: Objective measures were completed at Evaluation unless otherwise noted.  COGNITION:  Overall cognitive status: Within functional limits for tasks assessed   OBSERVATIONS / OTHER ASSESSMENTS:   POSTURE: WFL  LE ROM: WFL, but Limited mildly at R knee and ankle due to girth, skin approximation, and joint pain  LE MMT: WFL  Mild, Stage  II, Bilateral Lower Extremity Lymphedema 2/2 CVI and Obesity  Skin  Description Hyper-Keratosis Peau d' Orange Shiny Tight Fibrotic/ Indurated Fatty Doughy Spongy/ boggy   x x x x R>L   x   Skin dry Flaky WNL Macerated   mildly sclerotic     Color Redness Varicosities Blanching Hemosiderin Stain Mottled    x x   x   Odor Malodorous Yeast Fungal infection  WNL      x   Temperature Warm Cool wnl    x     Pitting Edema   1+ 2+ 3+ 4+ Non-pitting         x   Girth Symmetrical Asymmetrical                   Distribution    R>L RLE toes to groin    Stemmer Sign Positive Negative   +    Lymphorrhea History Of:  Present Absent     x    Wounds History Of Present Absent Venous Arterial Pressure Sheer     x  Signs of Infection Redness Warmth Erythema Acute Swelling Drainage Borders                    Sensation Light Touch Deep pressure Hypersensitivity   In tact Impaired In tact Impaired Absent Impaired   x  x  x     Nails WNL   Fungus nail  dystrophy   x     Hair Growth Symmetrical Asymmetrical    R>L   Skin Creases Base of toes  Ankles   Base of Fingers knees       Abdominal pannus Thigh Lobules  Face/neck   x x  x        BLE COMPARATIVE LIMB VOLUMETRICS 10/10/23  LANDMARK RIGHT  10/10/23  R LEG (A-D) 5466.5 ml  R THIGH (E-G) 9186.6 ml  R FULL LIMB (A-G) 14653.1 ml  Limb Volume differential (LVD)  LVD for LEG = 44.1%, R>L LVD for THIGH= 33.98%, R>L LVD for FULL lower extremity 37.8%, R>L  Volume change since last 08/11/22 R LEG is INCREASED in volume by 59.6%. L THIGH is INCREASED by 38 %, and RLE full limb is INCREASED by 45.38%.  Volume change overall V  (Blank rows = not tested)  LANDMARK LEFT  10/12/23  L LEG (A-D) 3053.6 ml  L THIGH (E-G) 6064.8 ml  L  FULL LIMB (A-G) 9118.4 ml  Limb Volume differential (LVD)  %  Volume change since initial %  Volume change overall %  (Blank rows = not tested)   10 th VISIT PROGRESS NOTE RLE COMPARATIVE LIMB VOLUMETRICS 01/04/24: Deferred until next visit by Pt request.  TBA next session  LANDMARK RIGHT    R LEG (A-D) TBA  R THIGH (E-G) TBA  R FULL LIMB (A-G) TBA  Limb Volume differential (LVD)    Volume change since last 08/11/22 TBA  Volume change overall TBA  (Blank rows = not tested)    CA-related LYMPHEDEMA Hx:  SURGERY TYPE/DATE: 2013 NUMBER OF LYMPH NODES REMOVED: 30 by report- bilateral pelvic and periaortic CHEMOTHERAPY: yes RADIATION:yes INFECTIONS: no hx cellulitis GAIT: Distance walked: >500 ft Assistive device utilized: single point cane Level of assistance: Modified independence- extra time Comments: R limp  LYMPHEDEMA LIFE IMPACT SCALE (LLIS): Intake 10/12/23 75% (The extent to which lymphedema related problems impacted your life over the past week)  FOTO (functional outcome measure):10/10/23 INTAKE: 36%  PATIENT EDUCATION:  Education details: Continued Pt/ CG edu for lymphedema self care home program throughout session. Topics include  outcome of comparative limb volumetrics- starting limb volume differentials (LVDs), technology and gradient techniques used for short stretch, multilayer compression wrapping, simple self-MLD, therapeutic lymphatic pumping exercises, skin/nail care, LE precautions,. compression garment recommendations and specifications, wear and care schedule and compression garment donning / doffing w assistive devices. Discussed progress towards all OT goals since commencing CDT. All questions answered to the Pt's satisfaction. Good return. Person educated: Patient Education method: Explanation, Demonstration, and Handouts Education comprehension: verbalized understanding, returned demonstration, and needs further education  LE SELF-CARE HOME  PROGRAM: Simple self-MLD/daily to affected quadrant and body part At least 2 x daily BLE Lymphatic Pumping There ex 1 set of 10 reps each, in order, bilaterally Daily skin care to affected body part to limit infection risk and increase skin excursion Compression Bandaging Intensive stage compression: multilayer short stretch wraps with gradient techniques. One limb at a time. Length patient dependent. Self-management Phase: Appropriate daytime compression garment and hours-of-sleep device  Compression garments: Custom-made gradient compression  garments and hours-of-sleep devices are medically necessary because they are uniquely sized and shaped to fit the exact dimensions of the affected extremities and to provide accurate and consistent gradient compression and containment, essential  for optimally managing chronic, progressive lymphedema. The convoluted HOS devices are medically necessary to facilitate increased lymphatic circulation and limit fibrosis formation when sleeping. Multiple custom compression garments are needed for optimal hygiene to limit infection risk. Custom compression garments should be replaced q 3-6 months When worn consistently for optimal  lipo-lymphedema self-management over time.  Pt will benefit from [id number]- Circaid reduction kit whole leg to increase independence with lymphedema self care, to  reduce limb volume  for body symmetry and balance, and to limit infection risk and progression.  ASSESSMENT:  CLINICAL IMPRESSION: After skilled teaching Pt able to perform simple self MLD short neck sequence, inguinal - axillary anastomosis strokes, inguinal MLD and thigh sequence with moderate assistance, including demonstration and verbal and manual cues. Pt will practice between sessions and we'll review next visit. Pt tolerated MLD to RLE/RLQ without increased pain. We also used deep fibrosis techniques to distal leg and medial malleolus with palpable softening in tissue density after session. Pt will apply wraps at home after session since she forgot to bring them today. Cont as per POC.    10/10/23 Lymphedema Episode 2, Initial OT Evaluation : Elizabeth Martin is a 56 y a female presenting with chronic, progressive, moderate, Stage II, RLE/RLQ cancer -related lymphedema with onset 11 years ago after Rx for endometrial cancer. Pt is well known to this therapist as she has successfully undergone Complete Decongestive Therapy (CDT)  this clinic bin the past. Pt reports recent exacerbation of RLE swelling worsened as inflammation in L hip has worsened over time.  Pt has a hip replacement scheduled in late December here [facility]. RE limb volumetrics today reveal that R LEG volume is dramatically increased by 59.65 since last visit. R LEG VOLUME is INCREASED in volume by 59.6%. L THIGH is INCREASED by 38 %, and RLE full limb is INCREASED by 45.38%. Pt presents with LE-related skin changes. Prolonged inflammation in the R hip joint has likely overloaded  already RLE/RLQ lymphatics.  RLE/RLQ lymphedema limits Pt's ability to perform basic and instrumental ADLs, including functional ambulation, mobility and transfers, grooming , lower body bathing and  dressing, skin inspection and skin care. Pt has difficulty  reaching her lower legs and feet to apply compression wraps and don/doff        compression stockings. LE limits ability to P[perform instrumental ADLs, including driving, yard work and home management activities. Lymphedema limits her ability to participate in leisure pursuits and productive activities, and it negatively impacts body image, life roles and quality of life.   Pt will benefit from an Intensive and follow along course of lymphedema. CDT will consist of Manual Lymphatic gainage   (MLD), skin care, therapeutic exercise and compression, wraps initially then garments. Pt will need assistance throughout CDT   for applying compression wraps due to limited hip AROM and decreased skin flexibility. Without skilled Occupational Therapy for lymphedema care, lymphedema will progress and further functional decline is expected.   In prep for upcoming hip replacement OT will educate Pt re assistive devices, including tub transfer bench and elevated toilet seat, in keeping with hip precautions.  OBJECTIVE IMPAIRMENTS: Abnormal gait, decreased activity tolerance, decreased balance, decreased knowledge of use of DME, decreased mobility, difficulty walking, decreased ROM, decreased strength, increased edema, decreased skin flexibility, increased fascial  restrictions, impaired sensation, pain, and chronic, progressive LLE/LLQ lymphatic swelling and associated pain.   ADL LIMITATIONS: carrying, lifting, bending, sitting, standing, squatting, sleeping, stairs, transfers, bed mobility, bathing, dressing, hygiene/grooming, and productive activities, leisure pursuits, social participation, body image  driving, shopping, housework, yard work, cooking, meal prep  PERSONAL FACTORS: Past/current experiences and 3+ comorbidities: Morphea Scleroderma, Mixed connective tissue disease, and osteoarthritis  are also affecting patient's functional outcome.   REHAB  POTENTIAL: Good  EVALUATION COMPLEXITY: Moderate  GOALS: Goals reviewed with patient? Yes  SHORT TERM GOALS: Target date: 4th OT Rx visit   Pt will demonstrate understanding of lymphedema precautions and prevention strategies with modified independence using a printed reference to identify at least 5 precautions and discussing how s/he may implement them into daily life to reduce risk of progression with extra time.  Baseline:Max A Goal status: GOAL MET  2.  Pt will be able to apply multilayer, knee length, gradient, compression wraps to one leg at a time with modified assistance (extra time and assistive device/s) to decrease limb volume, to limit infection risk, and to limit lymphedema progression.  Baseline: Dependent Goal status: GOAL MET  LONG TERM GOALS: Target date: 01/09/24 (12 weeks)  Given this patient's Intake score 36% on the functional outcomes FOTO tool, patient will experience an increase in function of 3 points to improve basic and instrumental ADLs performance, including lymphedema self-care.  Baseline: Max A Goal status: DEFERRED as Janyth Contes has been discontinued  2.  Given this patient's Intake score of  75% on the Lymphedema Life Impact Scale (LLIS), patient will experience a reduction of at least 5 points in her perceived level of functional impairment resulting from lymphedema to improve functional performance and quality of life (QOL). Baseline: % Goal status: PROGRESSING  3.  Pt will achieve at least a 10% volume reduction in full, RLE limb volume to return limb to typical size and shape, to limit infection risk and LE progression, to decrease pain, to improve function. Baseline: Dependent Goal status:PROGRESSING. Volumetrics deferred today by Pt request in order to increase time for manual therapy which provides pain relief. Volumetrics next visit. By visual assessment I would estimate ~10% volume reduction for entire RLE to DATE. tissue DENSITY IN THIGH HAS REDUCED  MILDLY AND SLIGHTLY IN LEG.   4.  Pt will obtain proper compression garments/devices and achieve modified independence (extra time + assistive devices) with donning/doffing to optimize limb volume reductions and limit LE  progression over time. Baseline:  Goal status: 01/04/24: PARTIALLY MET; In an effort to reduce burden of care on her spouse, Pt obtained adjustable, Velcro style R full leg, Circaid leg reduction kit for use s/p THA, but this proved not effective for controlling swelling, and were not easier to don and doff more independently. We've resumed wrapping until she is able to fit back into custom compression garments.  5.  During Intensive phase CDT , with modified independence, Pt will achieve at least 85% compliance with all lymphedema self-care home program components, including daily skin care, compression wraps and /or garments, simple self MLD and lymphatic pumping therex to habituate LE self care protocol  into ADLs for optimal LE self-management over time. Baseline: Dependent Goal status: ONGOING PLAN:  OT FREQUENCY: 2x/week  OT DURATION: 12 weeks and PRN  PLANNED INTERVENTIONS: Complete Decongestive Therapy (Intensive and supported Self-Management Phases), 97110-Therapeutic exercises, 97530- Therapeutic activity, 97535- Self Care, 54098- Manual therapy, Patient/Family education, Taping, Manual lymph drainage, Scar mobilization, Compression bandaging, DME instructions, and  skin care to reduce infection risk throughout manual therapy. Fit with replacement compression garments ASAP  PLAN FOR NEXT SESSION:  Complete RLE comparative limb volumetrics for progress note update Cont RLE/RLQ MLD Cont multilayer compression wrapping    Loel Dubonnet, MS, OTR/L, CLT-LANA 01/30/24 12:51 PM  01/30/2024, 12:51 PM

## 2024-01-30 NOTE — Therapy (Signed)
 OUTPATIENT PHYSICAL THERAPY TREATMENT   Patient Name: Elizabeth Martin MRN: [id number] DOB:[birth year], 63 y.o., female Today's Date: 01/30/2024  END OF SESSION:  PT End of Session - 01/30/24 0808     Visit Number 5    Number of Visits 16    Date for PT Re-Evaluation 03/01/24    Authorization Type [employer]    Progress Note Due on Visit 10    PT Start Time 0803    PT Stop Time 0841    PT Time Calculation (min) 38 min    Equipment Utilized During Treatment Gait belt    Activity Tolerance Patient tolerated treatment well;No increased pain    Behavior During Therapy WFL for tasks assessed/performed                Past Medical History:  Diagnosis Date   Anxiety    Asthma    Autoimmune disease (HCC)    Cancer (HCC)    Endometrial lymph node removal (30)   Corneal abrasion, right 05/24/2022   Depression    GERD (gastroesophageal reflux disease)    H/O blood clots    upper rt groin and behind both knees   History of hiatal hernia    Hypertension    Lymphedema    right leg   Morphea scleroderma    PONV (postoperative nausea and vomiting)    states gets violently ill   Past Surgical History:  Procedure Laterality Date   BASAL CELL CARCINOMA EXCISION Left    CHOLECYSTECTOMY  2008   FLEXIBLE SIGMOIDOSCOPY N/A 05/19/2022   Procedure: FLEXIBLE SIGMOIDOSCOPY;  Surgeon: Toledo, Boykin Nearing, MD;  Location: [facility];  Service: Gastroenterology;  Laterality: N/A;   HERNIA REPAIR  2004   ILEOSTOMY CLOSURE N/A 08/24/2022   Procedure: ILEOSTOMY TAKEDOWN, open loop with Lynden Oxford, PA-C to assist;  Surgeon: Henrene Dodge, MD;  Location: [facility] OR[facility];  Service: General;  Laterality: N/A;   IVC FILTER INSERTION N/A 11/09/2023   Procedure: IVC FILTER INSERTION;  Surgeon: Annice Needy, MD;  Location: [facility];  Service: Cardiovascular;  Laterality: N/A;   IVC FILTER REMOVAL N/A 01/02/2024   Procedure: IVC FILTER REMOVAL;  Surgeon: Annice Needy, MD;  Location: [facility]  INVASIVE CV LAB;  Service: Cardiovascular;  Laterality: N/A;   PLANTAR FASCIECTOMY     ROBOTIC ASSISTED TOTAL HYSTERECTOMY WITH BILATERAL SALPINGO OOPHERECTOMY  02/14/2012   ROTATOR CUFF REPAIR Left 03/24/2022   TONSILLECTOMY     TOTAL HIP ARTHROPLASTY Right 11/14/2023   Procedure: TOTAL HIP ARTHROPLASTY - posterior;  Surgeon: Reinaldo Berber, MD;  Location: [facility] ORS;  Service: Orthopedics;  Laterality: Right;   XI ROBOTIC ASSISTED LOWER ANTERIOR RESECTION N/A 05/24/2022   Procedure: XI ROBOTIC ASSISTED LOWER ANTERIOR RESECTION;  Surgeon: Henrene Dodge, MD;  Location: [facility] ORS;  Service: General;  Laterality: N/A;   Patient Active Problem List   Diagnosis Date Noted   Constipation 01/10/2024   Nausea & vomiting 01/10/2024   S/P total right hip arthroplasty 11/14/2023   Blood coagulation disorder (HCC) 11/10/2023   PTSD (post-traumatic stress disorder) 11/10/2023   Severe episode of recurrent major depressive disorder, without psychotic features (HCC) 11/10/2023   GAD (generalized anxiety disorder) 11/10/2023   High risk medication use 11/10/2023   Anxiety and depression 03/22/2023   Cervical radiculopathy 03/16/2023   Colon cancer screening 03/15/2023   Chronic idiopathic constipation 03/15/2023   Dyspnea 03/07/2023   [facility] spotted fever 03/07/2023   Strain of gastrocnemius tendon 03/07/2023   Lower  abdominal pain 03/07/2023   Pain in pelvis 03/07/2023   Small bowel obstruction (HCC) 02/11/2023   Raynaud disease 02/11/2023   Polyarthralgia 02/11/2023   Edema 01/03/2023   Diverticular disease 12/10/2022   Hot flashes 12/10/2022   Pain of right lower extremity 12/10/2022   Paresthesia of lower extremity 12/10/2022   Primary insomnia 12/10/2022   Thyroid nodule 12/10/2022   Multiple joint pain 12/10/2022   Basal cell carcinoma of skin 12/10/2022   Acute bilateral low back pain without sciatica 11/10/2022   Chronic lower back pain 09/05/2022   S/P closure of ileostomy  08/24/2022   Ileostomy in place [facility])    Morphea 07/13/2022   Colonic stricture (HCC) 05/19/2022   Large bowel obstruction (HCC) 05/19/2022   Hypokalemia 05/18/2022   Abdominal pain 05/18/2022   Osteoarthritis of left knee 02/24/2022   Mixed connective tissue disease (HCC) 02/17/2022   Adhesive capsulitis of left shoulder 01/27/2022   Glenohumeral arthritis, left 01/27/2022   Microscopic hematuria 01/12/2022   Otorrhagia of right ear 12/10/2021   Elevated testosterone level in female 11/04/2021   Anti-TPO antibodies present 10/20/2021   Rash 10/13/2021   Hashimoto thyroiditis, 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   Hashimoto's disease 03/23/2021   Exposure to severe acute respiratory syndrome coronavirus 2 (SARS-CoV-2) 12/19/2020   Migraine without aura and without status migrainosus, not intractable 08/19/2020   Myofascial pain 07/30/2020   Neck pain 07/30/2020   Upper back pain 07/30/2020   Cervical facet joint syndrome 07/30/2020   Hair loss 07/14/2020   Edema of lower extremity 07/11/2020   Adenomatous colon polyp 02/26/2020   Gastroesophageal reflux disease 02/15/2020   Diarrhea 02/15/2020   Sleep disorder 01/16/2020   Hyperlipidemia 07/11/2019   Right shoulder pain 04/04/2019   Pre-operative clearance 12/13/2018   Endometrial cancer (HCC) 04/05/2018   Patellofemoral pain syndrome of right knee 03/20/2018   Gastroenteritis 12/07/2017   Fatigue 11/16/2017   Morbid obesity (HCC) 08/11/2017   Stucco keratoses 08/11/2017   Essential hypertension 08/09/2017   History of endometrial cancer 08/09/2017   Lymphedema 08/09/2017   Menopause 08/09/2017   Malignant neoplastic disease (HCC) 04/07/2017   Cellulitis 09/18/2016   Benign hypertension 06/02/2016   Asthma 06/04/1969    PCP: Pennie Banter MD   REFERRING PROVIDER: Pennie Banter MD   REFERRING DIAG: s/p R hip replacement  THERAPY DIAG:  Pain in right hip  Stiffness of right hip, not elsewhere classified  Difficulty  in walking, not elsewhere classified  Rationale for Evaluation and Treatment: Rehabilitation  ONSET DATE: 11/14/23  SUBJECTIVE:   SUBJECTIVE STATEMENT: Pt reports she has continued soreness but has also been progressing her walking outside of PT. She is feeling better about her trip to [place] in a month.   PERTINENT HISTORY: s/p R hip arthropaslasty 11/14/23 with Dr. Audelia Acton posterior approach, blood coagulation disorder, PTSD, anxiety, cervical radiculopathy dypsnea, polyarthralgia, Raynaud disease, s/p closure of ileostomy, Hashimoto thyroid, endometrial cancer, menopause, endometrial cancer, lymphedema. Patient had home health PT prior to PT here.   PAIN:  Are you having pain?  4/10 Rt hip joint pain, posterior portion of hip.    PRECAUTIONS: Other: recovering from hip sx  RED FLAGS: None   WEIGHT BEARING RESTRICTIONS: No  FALLS:  Has patient fallen in last 6 months? Yes. Number of falls 2  LIVING ENVIRONMENT: Lives with: lives with their spouse Lives in: House/apartment Stairs: No Has following equipment at home: Single point cane  OCCUPATION: retired,   PLOF: Independent  PATIENT GOALS: return to hiking,  working out, gardening.   NEXT MD VISIT: March 2025   OBJECTIVE:  Note: Objective measures were completed at Evaluation unless otherwise noted.  DIAGNOSTIC FINDINGS:   IMPRESSION: Right hip replacement.  No visible complicating feature.  PATIENT SURVEYS:  LEFS 5/80  COGNITION: Overall cognitive status: Within functional limits for tasks assessed    MUSCLE LENGTH: Hamstrings: limited bilaterally with R>L   LOWER EXTREMITY ROM:  Passive ROM Right eval Left eval  Hip flexion 48 flexion PROM supine   Hip extension    Hip abduction 5 degrees AROM     (Blank rows = not tested)  LOWER EXTREMITY MMT:  MMT Right eval Left eval  Hip flexion 2+ 5  Hip extension  5  Hip abduction 2+* painful 5  Hip adduction 2+ 5  Hip internal rotation    Hip  external rotation    Knee flexion 3 5  Knee extension 3 5  Ankle dorsiflexion 3 5  Ankle plantarflexion 3 5   (Blank rows = not tested)    FUNCTIONAL TESTS:  5 times sit to stand: 29 6 minute walk test:  10 meter walk test: 15 seconds with SPC   GAIT: Distance walked: 60 ft Assistive device utilized: Single point cane Level of assistance: CGA Comments: antalgic gait pattern with decreased stance time RLE. Slight vaulting pattern.  Stair negotiation: -step to pattern lead with LLE, one hand on cane one hand on rail ascending. Descending step to pattern down with RLE.                                                                                                                               TREATMENT DATE: 01/30/24  TE- To improve strength, endurance, mobility, and function of specific targeted muscle groups or improve joint range of motion or improve muscle flexibility  Nustep for AAROM, progressed speed throughout, x 6 min   In // bars March walk x 2 laps Hamstring curl walk x 2 laps  Sidestepping x 2 laps Ad lib UE support for all of the above.   Hamstring stretch 30 seconds each LE  x  TherAct: Obstacle course for functional mobility and balance  - cones x 3 rounds  - added step up / down and step over x 3 rounds - added cognitive dual task x 4 rounds   Neuro On airex 1/2 romberg stance R LE post 3 x 30 sec   - added head turns to last round  Airex step up and down x 10 R LE leading    PATIENT EDUCATION:  Education details: goals, POC, HEP Person educated: Patient Education method: Explanation, Demonstration, Tactile cues, and Verbal cues Education comprehension: verbalized understanding, returned demonstration, verbal cues required, tactile cues required, and needs further education  HOME EXERCISE PROGRAM: Access Code: [facility] URL: [email or web address] Date: 01/11/2024 Prepared by: Thresa Ross  Exercises - Side Stepping with  Counter Support  - 1 x daily - 7 x  weekly - 2 sets - 10 reps - 5 hold - Standing Hip Extension  - 1 x daily - 7 x weekly - 2 sets - 10 reps - 5 hold - Standing March with Counter Support  - 1 x daily - 7 x weekly - 2 sets - 10 reps - 5 hold - Standing Hip Abduction with Counter Support  - 1 x daily - 7 x weekly - 2 sets - 10 reps - Seated Hamstring Stretch  - 1 x daily - 7 x weekly - 2 sets - 2 reps - 30 hold - Sit to Stand  - 1 x daily - 7 x weekly - 2 sets - 10 reps - 5 hold - Supine Heel Slide  - 1 x daily - 7 x weekly - 2 sets - 6 reps - Supine Gluteal Sets  - 1 x daily - 7 x weekly - 2 sets - 10 reps  ASSESSMENT: CLINICAL IMPRESSION:  Pt progresses with functional ambulation this date. Pt shows progress with balance activities but did have occasional LOB at times with dual task and required instruction in safest way to approach step up / down. Pt will continue to benefit from skilled physical therapy intervention to address impairments, improve QOL, and attain therapy goals.    OBJECTIVE IMPAIRMENTS: Abnormal gait, decreased activity tolerance, decreased balance, decreased coordination, decreased endurance, decreased mobility, difficulty walking, decreased ROM, decreased strength, hypomobility, increased edema, impaired perceived functional ability, impaired flexibility, improper body mechanics, postural dysfunction, and pain.   ACTIVITY LIMITATIONS: carrying, lifting, bending, sitting, standing, squatting, sleeping, stairs, transfers, bed mobility, bathing, toileting, dressing, reach over head, hygiene/grooming, locomotion level, and caring for others  PARTICIPATION LIMITATIONS: meal prep, cleaning, laundry, interpersonal relationship, driving, shopping, community activity, and yard work  PERSONAL FACTORS: Age, Past/current experiences, Time since onset of injury/illness/exacerbation, and 3+ comorbidities: blood coagulation disorder, PTSD, anxiety, cervical radiculopathy dypsnea,  polyarthralgia, Raynaud disease, s/p closure of ileostomy, Hashimoto thyroid, endometrial cancer, menopause, endometrial cancer, lymphedema  are also affecting patient's functional outcome.   REHAB POTENTIAL: Good  CLINICAL DECISION MAKING: Evolving/moderate complexity  EVALUATION COMPLEXITY: Moderate   GOALS: Goals reviewed with patient? Yes  SHORT TERM GOALS: Target date: 01/19/2024  Patient will be independent in home exercise program to improve strength/mobility for better functional independence with ADLs. Baseline:1/30: HEP given Goal status: INITIAL  LONG TERM GOALS: Target date: 03/01/2024  Patient (> 47 years old) will complete five times sit to stand test in < 10 seconds indicating an increased LE strength and improved balance. Baseline: 1/30: 29 seconds Goal status: INITIAL  2.  Patient will increase six minute walk test distance to >1000 for progression to community ambulator and improve gait ability Baseline: 430 ft in 5 min and required seated rest to end test due to fatigue, uses SPC Goal status: INITIAL  3.  Patient will increase 10 meter walk test to >1.52m/s as to improve gait speed for better community ambulation and to reduce fall risk. Baseline: 1/30: 15 seconds with SPC  Goal status: INITIAL  4.  Patient will increase lower extremity functional scale to >60/80 to demonstrate improved functional mobility and increased tolerance with ADLs.  Baseline: 1/30: 5/80 Goal status: INITIAL  PLAN:  PT FREQUENCY: 2x/week PT DURATION: 8 weeks PLANNED INTERVENTIONS: [id number]- PT Re-evaluation, 97110-Therapeutic exercises, 97530- Therapeutic activity, 97112- Neuromuscular re-education, 97535- Self Care, 27062- Manual therapy, [id number]- Gait training, [id number]- Splinting, 97014- Electrical stimulation (unattended), [id number]- Electrical stimulation (manual), [id number]- Ultrasound, [id number]- Traction (mechanical), Patient/Family education, Balance  training, Stair training, Taping, Joint  mobilization, Joint manipulation, Spinal manipulation, Spinal mobilization, Manual lymph drainage, Scar mobilization, Compression bandaging, Vestibular training, Visual/preceptual remediation/compensation, DME instructions, Cryotherapy, and Moist heat  PLAN FOR NEXT SESSION:  R hip strength and ROM   8:08 AM, 01/30/24    Norman Herrlich, PT 01/30/2024, 8:08 AM

## 2024-01-31 ENCOUNTER — Encounter: Payer: Self-pay | Admitting: Psychiatry

## 2024-01-31 ENCOUNTER — Telehealth (INDEPENDENT_AMBULATORY_CARE_PROVIDER_SITE_OTHER): Admitting: Psychiatry

## 2024-01-31 DIAGNOSIS — F4310 Post-traumatic stress disorder, unspecified: Secondary | ICD-10-CM

## 2024-01-31 DIAGNOSIS — F3341 Major depressive disorder, recurrent, in partial remission: Secondary | ICD-10-CM | POA: Diagnosis not present

## 2024-01-31 DIAGNOSIS — F411 Generalized anxiety disorder: Secondary | ICD-10-CM

## 2024-01-31 NOTE — Progress Notes (Unsigned)
 Virtual Visit via Video Note  I connected with Elizabeth Martin on 01/31/24 at 10:30 AM EST by a video enabled telemedicine application and verified that I am speaking with the correct person using two identifiers.  Location Provider Location : [facility] Patient Location : Home  Participants: Patient , Provider    I discussed the limitations of evaluation and management by telemedicine and the availability of in person appointments. The patient expressed understanding and agreed to proceed. I discussed the assessment and treatment plan with the patient. The patient was provided an opportunity to ask questions and all were answered. The patient agreed with the plan and demonstrated an understanding of the instructions.   The patient was advised to call back or seek an in-person evaluation if the symptoms worsen or if the condition fails to improve as anticipated.  [facility] MD OP Progress Note  01/31/2024 11:20 AM Elizabeth Martin  MRN:  [id number]  Chief Complaint:  Chief Complaint  Patient presents with   Follow-up   Anxiety   Depression   Medication Refill   HPI: Elizabeth Martin is a 63 year old Caucasian female, retired, lives in [place], married, has a history of MDD, GAD, PTSD, mixed connective tissue disease, ANA positive, long-term use of immunosuppressant medication, history of endometrial cancer, chronic pain, status post hip surgery was evaluated by telemedicine today.  She has a history of major depression and reports that the increase in Wellbutrin to 150 mg has been beneficial, helping her get through most of the day. Her depression has improved, and she denies any thoughts of self-harm. She is also taking Cymbalta 30 mg daily.  She experiences generalized anxiety disorder, with symptoms mostly at night. She continues to struggle with racing thoughts at night, which sometimes affect her sleep. These thoughts involve revisiting past events and conversations and are not  focused on one major worry. She is currently taking Buspar 10 mg twice a day and Trazodone as needed at night, which helps her sleep better when used. She used Trazodone twice last week, having reduced the dosage due to headaches. She is working with a Paramedic, Foye Clock, and has been practicing coping strategies, breathing techniques, and EMDR.  She experiences racing thoughts at night, potentially related to PTSD and the ongoing EMDR therapy.  She also has an autoimmune condition, which causes symptoms such as fatigue, body aches, joint pain, vomiting, constipation, and general malaise. She experiences occasional swelling in her fingers due to arthritis and swelling in her hips, likely related to previous surgery. She reports a loss of appetite, which she attributes to either the autoimmune condition or her medications, and has to remind herself to eat.    Visit Diagnosis:    ICD-10-CM   1. Recurrent major depressive disorder, in partial remission (HCC)  F33.41     2. PTSD (post-traumatic stress disorder)  F43.10     3. GAD (generalized anxiety disorder)  F41.1       Past Psychiatric History: I have reviewed past psychiatric history from progress note on 11/10/23.  Past Medical History:  Past Medical History:  Diagnosis Date   Anxiety    Asthma    Autoimmune disease (HCC)    Cancer (HCC)    Endometrial lymph node removal (30)   Corneal abrasion, right 05/24/2022   Depression    GERD (gastroesophageal reflux disease)    H/O blood clots    upper rt groin and behind both knees   History of hiatal hernia    Hypertension  Lymphedema    right leg   Morphea scleroderma    PONV (postoperative nausea and vomiting)    states gets violently ill    Past Surgical History:  Procedure Laterality Date   BASAL CELL CARCINOMA EXCISION Left    CHOLECYSTECTOMY  2008   FLEXIBLE SIGMOIDOSCOPY N/A 05/19/2022   Procedure: FLEXIBLE SIGMOIDOSCOPY;  Surgeon: Toledo, Boykin Nearing, MD;  Location:  [facility] ENDOSCOPY;  Service: Gastroenterology;  Laterality: N/A;   HERNIA REPAIR  2004   ILEOSTOMY CLOSURE N/A 08/24/2022   Procedure: ILEOSTOMY TAKEDOWN, open loop with Lynden Oxford, PA-C to assist;  Surgeon: Henrene Dodge, MD;  Location: [facility] ORS;  Service: General;  Laterality: N/A;   IVC FILTER INSERTION N/A 11/09/2023   Procedure: IVC FILTER INSERTION;  Surgeon: Annice Needy, MD;  Location: [facility] INVASIVE CV LAB;  Service: Cardiovascular;  Laterality: N/A;   IVC FILTER REMOVAL N/A 01/02/2024   Procedure: IVC FILTER REMOVAL;  Surgeon: Annice Needy, MD;  Location: [facility] INVASIVE CV LAB;  Service: Cardiovascular;  Laterality: N/A;   PLANTAR FASCIECTOMY     ROBOTIC ASSISTED TOTAL HYSTERECTOMY WITH BILATERAL SALPINGO OOPHERECTOMY  02/14/2012   ROTATOR CUFF REPAIR Left 03/24/2022   TONSILLECTOMY     TOTAL HIP ARTHROPLASTY Right 11/14/2023   Procedure: TOTAL HIP ARTHROPLASTY - posterior;  Surgeon: Reinaldo Berber, MD;  Location: [facility] ORS;  Service: Orthopedics;  Laterality: Right;   XI ROBOTIC ASSISTED LOWER ANTERIOR RESECTION N/A 05/24/2022   Procedure: XI ROBOTIC ASSISTED LOWER ANTERIOR RESECTION;  Surgeon: Henrene Dodge, MD;  Location: [facility] ORS;  Service: General;  Laterality: N/A;    Family Psychiatric History: I have reviewed family psychiatric history from progress note on 11/10/2023.  Family History:  Family History  Problem Relation Age of Onset   Mental illness Neg Hx     Social History: I have reviewed social history from progress note on 11/10/2023. Social History   Socioeconomic History   Marital status: Married    Spouse name: Not on file   Number of children: Not on file   Years of education: Not on file   Highest education level: Not on file  Occupational History   Not on file  Tobacco Use   Smoking status: Never   Smokeless tobacco: Never  Vaping Use   Vaping status: Never Used  Substance and Sexual Activity   Alcohol use: Yes   Drug use: Never   Sexual activity: Not  Currently  Other Topics Concern   Not on file  Social History Narrative   ** Merged History Encounter **       Social Drivers of Health   Financial Resource Strain: Low Risk  (08/20/2023)   Received from [facility]   Overall Financial Resource Strain (CARDIA)    Difficulty of Paying Living Expenses: Not hard at all  Food Insecurity: No Food Insecurity (11/14/2023)   Hunger Vital Sign    Worried About Running Out of Food in the Last Year: Never true    Ran Out of Food in the Last Year: Never true  Transportation Needs: No Transportation Needs (11/14/2023)   PRAPARE - Administrator, Civil Service (Medical): No    Lack of Transportation (Non-Medical): No  Physical Activity: Not on file  Stress: Not on file  Social Connections: Unknown (02/01/2023)   Received from [facility]   Social Network    Social Network: Not on file    Allergies:  Allergies  Allergen Reactions   Carboplatin Anaphylaxis  Hydromorphone Anaphylaxis    Stopped breathing  Respiratory arrest - due to dosage administered per the patient.   Latex Hives, Itching and Rash   Nitrofurantoin Nausea And Vomiting   Silicone Itching and Rash   Codeine Nausea And Vomiting   Other Nausea And Vomiting    Any "codone" medications "Violently sick even with zofran" per patient report    Wheat Other (See Comments)    Body aches and swelling    Oxycodone Nausea And Vomiting    Metabolic Disorder Labs: No results found for: "HGBA1C", "MPG" No results found for: "PROLACTIN" No results found for: "CHOL", "TRIG", "HDL", "CHOLHDL", "VLDL", "LDLCALC" Lab Results  Component Value Date   TSH 2.066 12/21/2023    Therapeutic Level Labs: No results found for: "LITHIUM" No results found for: "VALPROATE" No results found for: "CBMZ"  Current Medications: Current Outpatient Medications  Medication Sig Dispense Refill   lubiprostone (AMITIZA) 24 MCG capsule Take by mouth.      acetaminophen (TYLENOL) 500 MG tablet Take 2 tablets (1,000 mg total) by mouth every 8 (eight) hours. 30 tablet 0   albuterol (VENTOLIN HFA) 108 (90 Base) MCG/ACT inhaler Inhale 2 puffs into the lungs every 6 (six) hours as needed for shortness of breath or wheezing.     Alum Hydroxide-Mag Carbonate (GAVISCON PO) Take 4 tablets by mouth daily as needed (upset stomach).     budesonide-formoterol (SYMBICORT) 80-4.5 MCG/ACT inhaler Inhale 2 puffs into the lungs every 4 (four) hours as needed (Shortness of breath).     buPROPion (WELLBUTRIN XL) 150 MG 24 hr tablet Take 1 tablet (150 mg total) by mouth in the morning. 90 tablet 0   busPIRone (BUSPAR) 10 MG tablet Take 10 mg by mouth 2 (two) times daily.     cetirizine (ZYRTEC) 10 MG tablet Take 10 mg by mouth daily.     CLOBETASOL PROPIONATE E 0.05 % emollient cream Apply topically.     desonide (DESOWEN) 0.05 % cream Apply 1 Application topically 2 (two) times daily as needed (morphea flare).     DULoxetine (CYMBALTA) 30 MG capsule Take 1 capsule (30 mg total) by mouth daily.     famotidine-calcium carbonate-magnesium hydroxide (PEPCID COMPLETE) 10-800-165 MG chewable tablet Chew 1 tablet by mouth daily as needed (heartburn).     folic acid (FOLVITE) 1 MG tablet Take 1 mg by mouth daily.     hydrocortisone-pramoxine (ANALPRAM-HC) 2.5-1 % rectal cream Place 1 Application rectally 3 (three) times daily as needed for hemorrhoids.     hydroxychloroquine (PLAQUENIL) 200 MG tablet Take 200 mg by mouth 2 (two) times daily.     lisinopril (ZESTRIL) 40 MG tablet Take 40 mg by mouth daily.     methotrexate (RHEUMATREX) 2.5 MG tablet Take 10 mg by mouth 2 (two) times a week. Take 10 mg on Monday and Tuesday     omeprazole (PRILOSEC) 40 MG capsule Take 40 mg by mouth daily.     ondansetron (ZOFRAN-ODT) 4 MG disintegrating tablet Take 4 mg by mouth every 8 (eight) hours as needed for nausea or vomiting.     promethazine (PHENERGAN) 12.5 MG tablet Take 12.5 mg by  mouth every 8 (eight) hours as needed.     silver sulfADIAZINE (SILVADENE) 1 % cream Apply 1 Application topically daily.     traZODone (DESYREL) 50 MG tablet Take 1 tablet (50 mg total) by mouth at bedtime as needed for sleep. 90 tablet 0   triamcinolone (KENALOG) 0.025 % cream Apply 1 Application  topically 2 (two) times daily as needed (morphea flare).     trospium (SANCTURA) 20 MG tablet Take 1 tablet (20 mg total) by mouth 2 (two) times daily as needed (overactive bladder). 180 tablet 1   No current facility-administered medications for this visit.     Musculoskeletal: Strength & Muscle Tone:  UTA Gait & Station:  Seated Patient leans: N/A  Psychiatric Specialty Exam: Review of Systems  Psychiatric/Behavioral:  Positive for sleep disturbance. The patient is nervous/anxious.     There were no vitals taken for this visit.There is no height or weight on file to calculate BMI.  General Appearance: Casual  Eye Contact:  Fair  Speech:  Clear and Coherent  Volume:  Normal  Mood:  Anxious  Affect:  Appropriate  Thought Process:  Goal Directed and Descriptions of Associations: Intact  Orientation:  Full (Time, Place, and Person)  Thought Content: Logical   Suicidal Thoughts:  No  Homicidal Thoughts:  No  Memory:  Immediate;   Fair Recent;   Fair Remote;   Fair  Judgement:  Fair  Insight:  Fair  Psychomotor Activity:  Normal  Concentration:  Concentration: Fair and Attention Span: Fair  Recall:  [employer] of Knowledge: Fair  Language: Fair  Akathisia:  No  Handed:  Right  AIMS (if indicated): not done  Assets:  Desire for Improvement Housing Social Support Transportation  ADL's:  Intact  Cognition: WNL  Sleep:   restless due to racing thoughts   Screenings: GAD-7    Advertising copywriter from 11/25/2023 in [facility] Office Visit from 11/10/2023 in [facility]  Total GAD-7 Score 20  17      PHQ2-9    Flowsheet Row Counselor from 11/25/2023 in [facility] Office Visit from 11/10/2023 in [facility]  PHQ-2 Total Score 6 5  PHQ-9 Total Score 23 21      Flowsheet Row Video Visit from 01/31/2024 in [facility] ED to Hosp-Admission (Discharged) from 01/09/2024 in [facility] at [facility] Video Visit from 12/20/2023 in [facility]  C-SSRS RISK CATEGORY No Risk No Risk No Risk        Assessment and Plan: Elizabeth Martin is a 63 year old Caucasian female with history of depression, anxiety, multiple medical problems including mixed connective tissue disease, history of endometrial cancer, chronic pain, recent surgery was evaluated by telemedicine today.  Discussed assessment and plan as noted below.  Generalized Anxiety Disorder-unstable Reports improvement in daytime anxiety with current medication regimen but continues to experience racing thoughts at night, affecting sleep. Currently on Wellbutrin 150 mg, Cymbalta 30 mg, and Buspar 10 mg twice a day. Using Trazodone as needed for sleep, which helps but is not used consistently. Working with a Paramedic and engaging in EMDR, which may be contributing to increased nighttime anxiety. Discussed the option of adding prazosin for PTSD-related nightmares and sleep issues if trazodone is ineffective. - Take Trazodone 50 mg nightly for the next few weeks - Send MyChart message in 2-3 weeks to report on Trazodone efficacy - Consider Prazosin if Trazodone is ineffective - Continue BuSpar 10 mg twice daily.  We will consider increasing the dosage in the future.   Post-Traumatic Stress Disorder (PTSD)-unstable Experiencing racing thoughts at night, potentially exacerbated by ongoing EMDR therapy. Currently using trazodone as needed for sleep,  which helps but is  not used consistently. Discussed the option of adding prazosin for PTSD-related nightmares and sleep issues if trazodone is ineffective. - Take Trazodone nightly for the next few weeks - Consider Prazosin if Trazodone is ineffective - Continue Cymbalta 30 mg daily-reduced dosage. - Continue EMDR with Ms. Renee Ramus  Major Depression in partial remission Reports improvement in depressive symptoms with the current medication regimen. No suicidal ideation or significant changes in appetite noted. - Continue Wellbutrin 150 mg daily - Continue Cymbalta 30 mg daily - Continue Buspar 10 mg twice a day    Follow-up - Follow-up appointment in office in 2 months.  Collaboration of Care: Collaboration of Care: Other patient encouraged to continue CBT  Patient/Guardian was advised Release of Information must be obtained prior to any record release in order to collaborate their care with an outside provider. Patient/Guardian was advised if they have not already done so to contact the registration department to sign all necessary forms in order for [place] to release information regarding their care.   Consent: Patient/Guardian gives verbal consent for treatment and assignment of benefits for services provided during this visit. Patient/Guardian expressed understanding and agreed to proceed.   This note was generated in part or whole with voice recognition software. Voice recognition is usually quite accurate but there are transcription errors that can and very often do occur. I apologize for any typographical errors that were not detected and corrected.    Jomarie Longs, MD 02/01/2024, 9:30 AM

## 2024-02-01 ENCOUNTER — Ambulatory Visit (INDEPENDENT_AMBULATORY_CARE_PROVIDER_SITE_OTHER): Admitting: Licensed Clinical Social Worker

## 2024-02-01 ENCOUNTER — Ambulatory Visit: Admitting: Physical Therapy

## 2024-02-01 ENCOUNTER — Ambulatory Visit: Admitting: Occupational Therapy

## 2024-02-01 DIAGNOSIS — I890 Lymphedema, not elsewhere classified: Secondary | ICD-10-CM

## 2024-02-01 DIAGNOSIS — R262 Difficulty in walking, not elsewhere classified: Secondary | ICD-10-CM

## 2024-02-01 DIAGNOSIS — F411 Generalized anxiety disorder: Secondary | ICD-10-CM | POA: Diagnosis not present

## 2024-02-01 DIAGNOSIS — F4310 Post-traumatic stress disorder, unspecified: Secondary | ICD-10-CM

## 2024-02-01 DIAGNOSIS — F3341 Major depressive disorder, recurrent, in partial remission: Secondary | ICD-10-CM | POA: Diagnosis not present

## 2024-02-01 DIAGNOSIS — M25651 Stiffness of right hip, not elsewhere classified: Secondary | ICD-10-CM

## 2024-02-01 DIAGNOSIS — M25551 Pain in right hip: Secondary | ICD-10-CM

## 2024-02-01 NOTE — Progress Notes (Signed)
   THERAPIST PROGRESS NOTE  Session Time: 10:15-10:49am  Participation Level: Active  Behavioral Response: CasualAlertEuthymic  Type of Therapy: Individual Therapy  Treatment Goals addressed:  Goal: LTG: Elimination of maladaptive behaviors and thinking patterns which interfere with resolution of trauma as evidenced by self report     Dates: Start:  11/25/23    Expected End:  04/24/24       Disciplines: Interdisciplinary, PROVIDER                   Goal: LTG: Develop and implement effective coping skills to carry out normal responsibilities and participate constructively in relationships as evidenced by self report     Dates: Start:  11/25/23    Expected End:  04/24/24       Disciplines: Interdisciplinary, PROVIDER                Goal: LTG: Recall traumatic events without becoming overwhelmed with negative emotions     Dates: Start:  11/25/23    Expected End:  04/24/24       Disciplines: Interdisciplinary, PROVIDER                Goal: LTG: Pt reports "think about these things in the past without having such an impact on my life."           ProgressTowards Goals: Progressing  Interventions: Supportive and Other: EMDR  Summary: Elizabeth Martin is a 63 y.o. female who presents with symptoms of depression and trauma. Patient identifies symptoms to include reexperiencing, uncontrollable worry, and hypervigilance. Pt was oriented times 5. Pt was cooperative and engaged. Pt denies SI/HI/AVH.   Patient began session processing recent EMDR session citing discovery of a new triggers/new memory.  Patient reflected on new memory and realizations. Reports her trazodone was increased and she was asked to take every night for 2 weeks to assist in racing thoughts at night.   Patient continuing processing her target sequence plan through EMDR processing a SA memory.  Patient reports desensitization from 8-0.  Reinforced adaptive belief "I am worthy ."   Suicidal/Homicidal: Nowithout  intent/plan  Therapist Response: Clinician used active and supportive reflection to create a safe environment for patient to process recent life stressors.  Clinician assessed for current symptoms, stressors, safety since last session.  Clinician continued EMDR through processing memories on her target sequence plan.   Plan: Return again in 1 week.  Diagnosis: PTSD (post-traumatic stress disorder)  Recurrent major depressive disorder, in partial remission (HCC)  GAD (generalized anxiety disorder)   Collaboration of Care: AEB psychiatrist can access notes and cln. Will review psychiatrists' notes. Check in with the patient and will see LCSW per availability. Patient agreed with treatment recommendations.   Patient/Guardian was advised Release of Information must be obtained prior to any record release in order to collaborate their care with an outside provider. Patient/Guardian was advised if they have not already done so to contact the registration department to sign all necessary forms in order for [place] to release information regarding their care.   Consent: Patient/Guardian gives verbal consent for treatment and assignment of benefits for services provided during this visit. Patient/Guardian expressed understanding and agreed to proceed.   Dereck Leep, LCSW 02/01/2024

## 2024-02-01 NOTE — Therapy (Signed)
 OUTPATIENT PHYSICAL THERAPY TREATMENT   Patient Name: Elizabeth Martin MRN: [id number] DOB:24-Sep-1961, 63 y.o., female Today's Date: 02/01/2024  END OF SESSION:  PT End of Session - 02/01/24 0808     Visit Number 6    Number of Visits 16    Date for PT Re-Evaluation 03/01/24    Authorization Type [employer]    Progress Note Due on Visit 10    PT Start Time 0808    PT Stop Time 0845    PT Time Calculation (min) 37 min    Equipment Utilized During Treatment Gait belt    Activity Tolerance Patient tolerated treatment well;No increased pain    Behavior During Therapy WFL for tasks assessed/performed                Past Medical History:  Diagnosis Date   Anxiety    Asthma    Autoimmune disease (HCC)    Cancer (HCC)    Endometrial lymph node removal (30)   Corneal abrasion, right 05/24/2022   Depression    GERD (gastroesophageal reflux disease)    H/O blood clots    upper rt groin and behind both knees   History of hiatal hernia    Hypertension    Lymphedema    right leg   Morphea scleroderma    PONV (postoperative nausea and vomiting)    states gets violently ill   Past Surgical History:  Procedure Laterality Date   BASAL CELL CARCINOMA EXCISION Left    CHOLECYSTECTOMY  2008   FLEXIBLE SIGMOIDOSCOPY N/A 05/19/2022   Procedure: FLEXIBLE SIGMOIDOSCOPY;  Surgeon: Toledo, Boykin Nearing, MD;  Location: [facility];  Service: Gastroenterology;  Laterality: N/A;   HERNIA REPAIR  2004   ILEOSTOMY CLOSURE N/A 08/24/2022   Procedure: ILEOSTOMY TAKEDOWN, open loop with Lynden Oxford, PA-C to assist;  Surgeon: Henrene Dodge, MD;  Location: [facility] ORS;  Service: General;  Laterality: N/A;   IVC FILTER INSERTION N/A 11/09/2023   Procedure: IVC FILTER INSERTION;  Surgeon: Annice Needy, MD;  Location: [facility]IVE [facility];  Service: Cardiovascular;  Laterality: N/A;   IVC FILTER REMOVAL N/A 01/02/2024   Procedure: IVC FILTER REMOVAL;  Surgeon: Annice Needy, MD;  Location: [facility]  INVASIVE CV LAB;  Service: Cardiovascular;  Laterality: N/A;   PLANTAR FASCIECTOMY     ROBOTIC ASSISTED TOTAL HYSTERECTOMY WITH BILATERAL SALPINGO OOPHERECTOMY  02/14/2012   ROTATOR CUFF REPAIR Left 03/24/2022   TONSILLECTOMY     TOTAL HIP ARTHROPLASTY Right 11/14/2023   Procedure: TOTAL HIP ARTHROPLASTY - posterior;  Surgeon: Reinaldo Berber, MD;  Location: [facility] ORS;  Service: Orthopedics;  Laterality: Right;   XI ROBOTIC ASSISTED LOWER ANTERIOR RESECTION N/A 05/24/2022   Procedure: XI ROBOTIC ASSISTED LOWER ANTERIOR RESECTION;  Surgeon: Henrene Dodge, MD;  Location: [facility] ORS;  Service: General;  Laterality: N/A;   Patient Active Problem List   Diagnosis Date Noted   Constipation 01/10/2024   Nausea & vomiting 01/10/2024   S/P total right hip arthroplasty 11/14/2023   Blood coagulation disorder (HCC) 11/10/2023   PTSD (post-traumatic stress disorder) 11/10/2023   Severe episode of recurrent major depressive disorder, without psychotic features (HCC) 11/10/2023   GAD (generalized anxiety disorder) 11/10/2023   High risk medication use 11/10/2023   Anxiety and depression 03/22/2023   Cervical radiculopathy 03/16/2023   Colon cancer screening 03/15/2023   Chronic idiopathic constipation 03/15/2023   Dyspnea 03/07/2023   [facility] spotted fever 03/07/2023   Strain of gastrocnemius tendon 03/07/2023   Lower  abdominal pain 03/07/2023   Pain in pelvis 03/07/2023   Small bowel obstruction (HCC) 02/11/2023   Raynaud disease 02/11/2023   Polyarthralgia 02/11/2023   Edema 01/03/2023   Diverticular disease 12/10/2022   Hot flashes 12/10/2022   Pain of right lower extremity 12/10/2022   Paresthesia of lower extremity 12/10/2022   Primary insomnia 12/10/2022   Thyroid nodule 12/10/2022   Multiple joint pain 12/10/2022   Basal cell carcinoma of skin 12/10/2022   Acute bilateral low back pain without sciatica 11/10/2022   Chronic lower back pain 09/05/2022   S/P closure of ileostomy  08/24/2022   Ileostomy in place [facility])    Morphea 07/13/2022   Colonic stricture (HCC) 05/19/2022   Large bowel obstruction (HCC) 05/19/2022   Hypokalemia 05/18/2022   Abdominal pain 05/18/2022   Osteoarthritis of left knee 02/24/2022   Mixed connective tissue disease (HCC) 02/17/2022   Adhesive capsulitis of left shoulder 01/27/2022   Glenohumeral arthritis, left 01/27/2022   Microscopic hematuria 01/12/2022   Otorrhagia of right ear 12/10/2021   Elevated testosterone level in female 11/04/2021   Anti-TPO antibodies present 10/20/2021   Rash 10/13/2021   Hashimoto thyroiditis, 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   Hashimoto's disease 03/23/2021   Exposure to severe acute respiratory syndrome coronavirus 2 (SARS-CoV-2) 12/19/2020   Migraine without aura and without status migrainosus, not intractable 08/19/2020   Myofascial pain 07/30/2020   Neck pain 07/30/2020   Upper back pain 07/30/2020   Cervical facet joint syndrome 07/30/2020   Hair loss 07/14/2020   Edema of lower extremity 07/11/2020   Adenomatous colon polyp 02/26/2020   Gastroesophageal reflux disease 02/15/2020   Diarrhea 02/15/2020   Sleep disorder 01/16/2020   Hyperlipidemia 07/11/2019   Right shoulder pain 04/04/2019   Pre-operative clearance 12/13/2018   Endometrial cancer (HCC) 04/05/2018   Patellofemoral pain syndrome of right knee 03/20/2018   Gastroenteritis 12/07/2017   Fatigue 11/16/2017   Morbid obesity (HCC) 08/11/2017   Stucco keratoses 08/11/2017   Essential hypertension 08/09/2017   History of endometrial cancer 08/09/2017   Lymphedema 08/09/2017   Menopause 08/09/2017   Malignant neoplastic disease (HCC) 04/07/2017   Cellulitis 09/18/2016   Benign hypertension 06/02/2016   Asthma 06/04/1969    PCP: Pennie Banter MD   REFERRING PROVIDER: Pennie Banter MD   REFERRING DIAG: s/p R hip replacement  THERAPY DIAG:  Pain in right hip  Stiffness of right hip, not elsewhere classified  Difficulty  in walking, not elsewhere classified  Rationale for Evaluation and Treatment: Rehabilitation  ONSET DATE: 11/14/23  SUBJECTIVE:   SUBJECTIVE STATEMENT: Arrives a few minutes late to scheduled PT Session. Pt reports that she is feeling a little more sore today. 4/10 in bil hips and 8/10 in stomach. Has been having "stomach issues" since 4:00AM. States that she has taken multiple Zofran to reduce risk of emesis.   Will be leaving for [place] on 3/19 for a 10 day trip.     PERTINENT HISTORY: s/p R hip arthropaslasty 11/14/23 with Dr. Audelia Acton posterior approach, blood coagulation disorder, PTSD, anxiety, cervical radiculopathy dypsnea, polyarthralgia, Raynaud disease, s/p closure of ileostomy, Hashimoto thyroid, endometrial cancer, menopause, endometrial cancer, lymphedema. Patient had home health PT prior to PT here.   PAIN:  Are you having pain?  4/10 Rt hip joint pain, posterior portion of hip.    PRECAUTIONS: Other: recovering from hip sx  RED FLAGS: None   WEIGHT BEARING RESTRICTIONS: No  FALLS:  Has patient fallen in last 6 months? Yes. Number of falls 2  LIVING ENVIRONMENT: Lives with: lives with their spouse Lives in: House/apartment Stairs: No Has following equipment at home: Single point cane  OCCUPATION: retired,   PLOF: Independent  PATIENT GOALS: return to hiking, working out, gardening.   NEXT MD VISIT: March 2025   OBJECTIVE:  Note: Objective measures were completed at Evaluation unless otherwise noted.  DIAGNOSTIC FINDINGS:   IMPRESSION: Right hip replacement.  No visible complicating feature.  PATIENT SURVEYS:  LEFS 5/80  COGNITION: Overall cognitive status: Within functional limits for tasks assessed    MUSCLE LENGTH: Hamstrings: limited bilaterally with R>L   LOWER EXTREMITY ROM:  Passive ROM Right eval Left eval  Hip flexion 48 flexion PROM supine   Hip extension    Hip abduction 5 degrees AROM     (Blank rows = not tested)  LOWER  EXTREMITY MMT:  MMT Right eval Left eval  Hip flexion 2+ 5  Hip extension  5  Hip abduction 2+* painful 5  Hip adduction 2+ 5  Hip internal rotation    Hip external rotation    Knee flexion 3 5  Knee extension 3 5  Ankle dorsiflexion 3 5  Ankle plantarflexion 3 5   (Blank rows = not tested)    FUNCTIONAL TESTS:  5 times sit to stand: 29 6 minute walk test:  10 meter walk test: 15 seconds with SPC   GAIT: Distance walked: 60 ft Assistive device utilized: Single point cane Level of assistance: CGA Comments: antalgic gait pattern with decreased stance time RLE. Slight vaulting pattern.  Stair negotiation: -step to pattern lead with LLE, one hand on cane one hand on rail ascending. Descending step to pattern down with RLE.                                                                                                                               TREATMENT DATE: 02/01/24  TA to improve BLE ROM and allow improved independence with ADLs and community mobility.   Nustep for AAROM, progressed speed throughout, x 6 min   In // bars Reciprocal foot tap on 6 inch step x 15 bil  Step up/back from 6 inch step x 4 bil  March walk x 3 laps Hamstring curl x 12  Sidestepping x 4 laps Forward/reverse x 4 laps.   Intermittent  UE support for all of the above.   Hamstring stretch 30 seconds each LE  x  Pt required multiple restroom breaks throughout session due to upset stomach, limiting skilled intervention   PATIENT EDUCATION:  Education details: goals, POC, HEP Person educated: Patient Education method: Explanation, Demonstration, Tactile cues, and Verbal cues Education comprehension: verbalized understanding, returned demonstration, verbal cues required, tactile cues required, and needs further education  HOME EXERCISE PROGRAM: Access Code: [facility] URL: [email or web address] Date: 01/11/2024 Prepared by: Thresa Ross  Exercises - Side Stepping  with Counter Support  - 1 x daily - 7 x weekly - 2 sets - 10  reps - 5 hold - Standing Hip Extension  - 1 x daily - 7 x weekly - 2 sets - 10 reps - 5 hold - Standing March with Counter Support  - 1 x daily - 7 x weekly - 2 sets - 10 reps - 5 hold - Standing Hip Abduction with Counter Support  - 1 x daily - 7 x weekly - 2 sets - 10 reps - Seated Hamstring Stretch  - 1 x daily - 7 x weekly - 2 sets - 2 reps - 30 hold - Sit to Stand  - 1 x daily - 7 x weekly - 2 sets - 10 reps - 5 hold - Supine Heel Slide  - 1 x daily - 7 x weekly - 2 sets - 6 reps - Supine Gluteal Sets  - 1 x daily - 7 x weekly - 2 sets - 10 reps  ASSESSMENT: CLINICAL IMPRESSION:  Pt progresses with functional ambulation this date.session slightly limited by late arrival and multiple restroom breaks. Pt tolerated increased repetitions and increased difficulty of movement with reduced UE support with step up/down on 6 inch step and dynamic gait.  Pt will continue to benefit from skilled physical therapy intervention to address impairments, improve QOL, and attain therapy goals.    OBJECTIVE IMPAIRMENTS: Abnormal gait, decreased activity tolerance, decreased balance, decreased coordination, decreased endurance, decreased mobility, difficulty walking, decreased ROM, decreased strength, hypomobility, increased edema, impaired perceived functional ability, impaired flexibility, improper body mechanics, postural dysfunction, and pain.   ACTIVITY LIMITATIONS: carrying, lifting, bending, sitting, standing, squatting, sleeping, stairs, transfers, bed mobility, bathing, toileting, dressing, reach over head, hygiene/grooming, locomotion level, and caring for others  PARTICIPATION LIMITATIONS: meal prep, cleaning, laundry, interpersonal relationship, driving, shopping, community activity, and yard work  PERSONAL FACTORS: Age, Past/current experiences, Time since onset of injury/illness/exacerbation, and 3+ comorbidities: blood coagulation  disorder, PTSD, anxiety, cervical radiculopathy dypsnea, polyarthralgia, Raynaud disease, s/p closure of ileostomy, Hashimoto thyroid, endometrial cancer, menopause, endometrial cancer, lymphedema  are also affecting patient's functional outcome.   REHAB POTENTIAL: Good  CLINICAL DECISION MAKING: Evolving/moderate complexity  EVALUATION COMPLEXITY: Moderate   GOALS: Goals reviewed with patient? Yes  SHORT TERM GOALS: Target date: 01/19/2024  Patient will be independent in home exercise program to improve strength/mobility for better functional independence with ADLs. Baseline:1/30: HEP given Goal status: INITIAL  LONG TERM GOALS: Target date: 03/01/2024  Patient (> 30 years old) will complete five times sit to stand test in < 10 seconds indicating an increased LE strength and improved balance. Baseline: 1/30: 29 seconds Goal status: INITIAL  2.  Patient will increase six minute walk test distance to >1000 for progression to community ambulator and improve gait ability Baseline: 430 ft in 5 min and required seated rest to end test due to fatigue, uses SPC Goal status: INITIAL  3.  Patient will increase 10 meter walk test to >1.57m/s as to improve gait speed for better community ambulation and to reduce fall risk. Baseline: 1/30: 15 seconds with SPC  Goal status: INITIAL  4.  Patient will increase lower extremity functional scale to >60/80 to demonstrate improved functional mobility and increased tolerance with ADLs.  Baseline: 1/30: 5/80 Goal status: INITIAL  PLAN:  PT FREQUENCY: 2x/week PT DURATION: 8 weeks PLANNED INTERVENTIONS: [id number]- PT Re-evaluation, 97110-Therapeutic exercises, 97530- Therapeutic activity, 97112- Neuromuscular re-education, 97535- Self Care, 96045- Manual therapy, [id number]- Gait training, [id number]- Splinting, 97014- Electrical stimulation (unattended), [id number]- Electrical stimulation (manual), [id number]- Ultrasound, [id number]- Traction (mechanical), Patient/Family  education, Balance  training, Stair training, Taping, Joint mobilization, Joint manipulation, Spinal manipulation, Spinal mobilization, Manual lymph drainage, Scar mobilization, Compression bandaging, Vestibular training, Visual/preceptual remediation/compensation, DME instructions, Cryotherapy, and Moist heat  PLAN FOR NEXT SESSION:   Dynamic gait and  R hip strength and ROM,  8:08 AM, 02/01/24    Golden Pop, PT 02/01/2024, 8:08 AM

## 2024-02-01 NOTE — Therapy (Signed)
 OUTPATIENT OCCUPATIONAL THERAPY TREATMENT NOTE   LOWER EXTREMITY LYMPHEDEMA  Patient Name: Elizabeth Martin MRN: [id number] DOB:11-09-1961, 63 y.o., female Today's Date: 02/01/2024   END OF SESSION:  Lymphedema Episode 2   OT End of Session - 02/01/24 1601     Visit Number 16    Number of Visits 36    Date for OT Re-Evaluation 04/16/24    OT Start Time 1108    OT Stop Time 1202    OT Time Calculation (min) 54 min    Activity Tolerance Patient tolerated treatment well;No increased pain    Behavior During Therapy WFL for tasks assessed/performed              Past Medical History:  Diagnosis Date   Anxiety    Asthma    Autoimmune disease (HCC)    Cancer (HCC)    Endometrial lymph node removal (30)   Corneal abrasion, right 05/24/2022   Depression    GERD (gastroesophageal reflux disease)    H/O blood clots    upper rt groin and behind both knees   History of hiatal hernia    Hypertension    Lymphedema    right leg   Morphea scleroderma    PONV (postoperative nausea and vomiting)    states gets violently ill   Past Surgical History:  Procedure Laterality Date   BASAL CELL CARCINOMA EXCISION Left    CHOLECYSTECTOMY  2008   FLEXIBLE SIGMOIDOSCOPY N/A 05/19/2022   Procedure: FLEXIBLE SIGMOIDOSCOPY;  Surgeon: Toledo, Boykin Nearing, MD;  Location: [facility];  Service: Gastroenterology;  Laterality: N/A;   HERNIA REPAIR  2004   ILEOSTOMY CLOSURE N/A 08/24/2022   Procedure: ILEOSTOMY TAKEDOWN, open loop with Lynden Oxford, PA-C to assist;  Surgeon: Henrene Dodge, MD;  Location: [facility];  Service: General;  Laterality: N/A;   IVC FILTER INSERTION N/A 11/09/2023   Procedure: IVC FILTER INSERTION;  Surgeon: Annice Needy, MD;  Location: [facility];  Service: Cardiovascular;  Laterality: N/A;   IVC FILTER REMOVAL N/A 01/02/2024   Procedure: IVC FILTER REMOVAL;  Surgeon: Annice Needy, MD;  Location: [facility];  Service: Cardiovascular;  Laterality:  N/A;   PLANTAR FASCIECTOMY     ROBOTIC ASSISTED TOTAL HYSTERECTOMY WITH BILATERAL SALPINGO OOPHERECTOMY  02/14/2012   ROTATOR CUFF REPAIR Left 03/24/2022   TONSILLECTOMY     TOTAL HIP ARTHROPLASTY Right 11/14/2023   Procedure: TOTAL HIP ARTHROPLASTY - posterior;  Surgeon: Reinaldo Berber, MD;  Location: [facility];  Service: Orthopedics;  Laterality: Right;   XI ROBOTIC ASSISTED LOWER ANTERIOR RESECTION N/A 05/24/2022   Procedure: XI ROBOTIC ASSISTED LOWER ANTERIOR RESECTION;  Surgeon: Henrene Dodge, MD;  Location: [facility];  Service: General;  Laterality: N/A;   Patient Active Problem List   Diagnosis Date Noted   Constipation 01/10/2024   Nausea & vomiting 01/10/2024   S/P total right hip arthroplasty 11/14/2023   Blood coagulation disorder (HCC) 11/10/2023   PTSD (post-traumatic stress disorder) 11/10/2023   Severe episode of recurrent major depressive disorder, without psychotic features (HCC) 11/10/2023   GAD (generalized anxiety disorder) 11/10/2023   High risk medication use 11/10/2023   Anxiety and depression 03/22/2023   Cervical radiculopathy 03/16/2023   Colon cancer screening 03/15/2023   Chronic idiopathic constipation 03/15/2023   Dyspnea 03/07/2023   [facility] spotted fever 03/07/2023   Strain of gastrocnemius tendon 03/07/2023   Lower abdominal pain 03/07/2023   Pain in pelvis 03/07/2023   Small bowel obstruction (HCC)  02/11/2023   Raynaud disease 02/11/2023   Polyarthralgia 02/11/2023   Edema 01/03/2023   Diverticular disease 12/10/2022   Hot flashes 12/10/2022   Pain of right lower extremity 12/10/2022   Paresthesia of lower extremity 12/10/2022   Primary insomnia 12/10/2022   Thyroid nodule 12/10/2022   Multiple joint pain 12/10/2022   Basal cell carcinoma of skin 12/10/2022   Acute bilateral low back pain without sciatica 11/10/2022   Chronic lower back pain 09/05/2022   S/P closure of ileostomy 08/24/2022   Ileostomy in place [facility])    Morphea  07/13/2022   Colonic stricture (HCC) 05/19/2022   Large bowel obstruction (HCC) 05/19/2022   Hypokalemia 05/18/2022   Abdominal pain 05/18/2022   Osteoarthritis of left knee 02/24/2022   Mixed connective tissue disease (HCC) 02/17/2022   Adhesive capsulitis of left shoulder 01/27/2022   Glenohumeral arthritis, left 01/27/2022   Microscopic hematuria 01/12/2022   Otorrhagia of right ear 12/10/2021   Elevated testosterone level in female 11/04/2021   Anti-TPO antibodies present 10/20/2021   Rash 10/13/2021   Hashimoto thyroiditis, 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   Hashimoto's disease 03/23/2021   Exposure to severe acute respiratory syndrome coronavirus 2 (SARS-CoV-2) 12/19/2020   Migraine without aura and without status migrainosus, not intractable 08/19/2020   Myofascial pain 07/30/2020   Neck pain 07/30/2020   Upper back pain 07/30/2020   Cervical facet joint syndrome 07/30/2020   Hair loss 07/14/2020   Edema of lower extremity 07/11/2020   Adenomatous colon polyp 02/26/2020   Gastroesophageal reflux disease 02/15/2020   Diarrhea 02/15/2020   Sleep disorder 01/16/2020   Hyperlipidemia 07/11/2019   Right shoulder pain 04/04/2019   Pre-operative clearance 12/13/2018   Endometrial cancer (HCC) 04/05/2018   Patellofemoral pain syndrome of right knee 03/20/2018   Gastroenteritis 12/07/2017   Fatigue 11/16/2017   Morbid obesity (HCC) 08/11/2017   Stucco keratoses 08/11/2017   Essential hypertension 08/09/2017   History of endometrial cancer 08/09/2017   Lymphedema 08/09/2017   Menopause 08/09/2017   Malignant neoplastic disease (HCC) 04/07/2017   Cellulitis 09/18/2016   Benign hypertension 06/02/2016   Asthma 06/04/1969    PCP: Pennie Banter, MD  REFERRING PROVIDER: Pennie Banter, MD  REFERRING DIAG: I89.0   THERAPY DIAG:  Lymphedema, not elsewhere classified  Rationale for Evaluation and Treatment: Rehabilitation  ONSET DATE: 2013  (Cancer-related, endometrial  2013)  SUBJECTIVE:                                                                                                                                                                                           SUBJECTIVE STATEMENT:Patient presents to s/p R hip arthroplasty 11/14/23.  Pt presents to OT for cancer-related lymphedema care to RLE/RLQ after exacerbation following THA. Pt is very motivated to improve lymphedema prior to her upcoming , long awaited trip to [place] with her spouse.Pt states, "I feel like I have turned the corner". She reports soreness in the R hip and tightness in the inguinal region after PT. Pt forgot to bring her compression wraps to clinic this morning, so we were unable to apply them after session.   PERTINENT HISTORY: Asthma, Autoimmune Disease, Endometrial Ca w/ LND ( 30 bilateral pelvic and periaortic LN), adjuvant chemotherapy and XRT,   H/O blood clots, RLE/RLQ lymphedema, Robotic assisted total hysterectomy w/ bilateral oophorectomy L Rotator Cuff repair, S/P ileostomy 2/2 colon stricture 05/2022 R hip arthroplasty scheduled 11/11/23.   PAIN:  Are you having pain? RLE lymphedema, Yes, not rated: NPRS scale: R HIP  4/10 Pain location: RLE, including hip Pain description: aching, heavy, full, tight Aggravating factors: standing, walking, extended dependent sitting Relieving factors: elevation, movement  PRECAUTIONS: Fall and Other: LYMPHEDEMA  WEIGHT BEARING RESTRICTIONS: Yes 5# lifting restriction- shoulder  FALLS:  Has patient fallen in last 6 months? No  LIVING ENVIRONMENT: Lives with: lives with their spouse Lives in: House/apartment Stairs: No;  Has following equipment at home: Single point cane, Environmental consultant - 2 wheeled, Environmental consultant - 4 wheeled, and Wheelchair (manual)  OCCUPATION: retired Control and instrumentation engineer: gardening, hiking, biking, dancing- unable to participate in any of these due to pain and swelling  HAND DOMINANCE: right   PRIOR LEVEL OF FUNCTION:  Independent with basic ADLs, Independent with household mobility without device, Independent with community mobility without device, Requires assistive device for independence, Needs assistance with homemaking, Needs assistance with transfers, and Leisure: decreased  social participation for leisure pursuits due to impaired mobility and pain  PATIENT GOALS:  Be able to move freely without pain Reduce limb volume to be able to lift limb for basic daily ADLs and functional ambulation Reduce limb volume to increase ability to perform lower body dressing and bathing and grooming Reduce limb to increase body image  OBJECTIVE: Note: Objective measures were completed at Evaluation unless otherwise noted.  COGNITION:  Overall cognitive status: Within functional limits for tasks assessed   OBSERVATIONS / OTHER ASSESSMENTS:   POSTURE: WFL  LE ROM: WFL, but Limited mildly at R knee and ankle due to girth, skin approximation, and joint pain  LE MMT: WFL  Mild, Stage  II, Bilateral Lower Extremity Lymphedema 2/2 CVI and Obesity  Skin  Description Hyper-Keratosis Peau d' Orange Shiny Tight Fibrotic/ Indurated Fatty Doughy Spongy/ boggy   x x x x R>L   x   Skin dry Flaky WNL Macerated   mildly sclerotic     Color Redness Varicosities Blanching Hemosiderin Stain Mottled    x x   x   Odor Malodorous Yeast Fungal infection  WNL      x   Temperature Warm Cool wnl    x     Pitting Edema   1+ 2+ 3+ 4+ Non-pitting         x   Girth Symmetrical Asymmetrical                   Distribution    R>L RLE toes to groin    Stemmer Sign Positive Negative   +    Lymphorrhea History Of:  Present Absent     x    Wounds History Of Present Absent Venous Arterial Pressure Sheer  x        Signs of Infection Redness Warmth Erythema Acute Swelling Drainage Borders                    Sensation Light Touch Deep pressure Hypersensitivity   In tact Impaired In tact Impaired Absent  Impaired   x  x  x     Nails WNL   Fungus nail dystrophy   x     Hair Growth Symmetrical Asymmetrical    R>L   Skin Creases Base of toes  Ankles   Base of Fingers knees       Abdominal pannus Thigh Lobules  Face/neck   x x  x        BLE COMPARATIVE LIMB VOLUMETRICS 10/10/23  LANDMARK RIGHT  10/10/23  R LEG (A-D) 5466.5 ml  R THIGH (E-G) 9186.6 ml  R FULL LIMB (A-G) 14653.1 ml  Limb Volume differential (LVD)  LVD for LEG = 44.1%, R>L LVD for THIGH= 33.98%, R>L LVD for FULL lower extremity 37.8%, R>L  Volume change since last 08/11/22 R LEG is INCREASED in volume by 59.6%. L THIGH is INCREASED by 38 %, and RLE full limb is INCREASED by 45.38%.  Volume change overall V  (Blank rows = not tested)  LANDMARK LEFT  10/12/23  L LEG (A-D) 3053.6 ml  L THIGH (E-G) 6064.8 ml  L  FULL LIMB (A-G) 9118.4 ml  Limb Volume differential (LVD)  %  Volume change since initial %  Volume change overall %  (Blank rows = not tested)   10 th VISIT PROGRESS NOTE RLE COMPARATIVE LIMB VOLUMETRICS 01/04/24: Deferred until next visit by Pt request.  TBA next session  LANDMARK RIGHT    R LEG (A-D) TBA  R THIGH (E-G) TBA  R FULL LIMB (A-G) TBA  Limb Volume differential (LVD)    Volume change since last 08/11/22 TBA  Volume change overall TBA  (Blank rows = not tested)    CA-related LYMPHEDEMA Hx:  SURGERY TYPE/DATE: 2013 NUMBER OF LYMPH NODES REMOVED: 30 by report- bilateral pelvic and periaortic CHEMOTHERAPY: yes RADIATION:yes INFECTIONS: no hx cellulitis GAIT: Distance walked: >500 ft Assistive device utilized: single point cane Level of assistance: Modified independence- extra time Comments: R limp  LYMPHEDEMA LIFE IMPACT SCALE (LLIS): Intake 10/12/23 75% (The extent to which lymphedema related problems impacted your life over the past week)  FOTO (functional outcome measure):10/10/23 INTAKE: 36%  PATIENT EDUCATION:  Education details: Continued Pt/ CG edu for lymphedema self care  home program throughout session. Topics include outcome of comparative limb volumetrics- starting limb volume differentials (LVDs), technology and gradient techniques used for short stretch, multilayer compression wrapping, simple self-MLD, therapeutic lymphatic pumping exercises, skin/nail care, LE precautions,. compression garment recommendations and specifications, wear and care schedule and compression garment donning / doffing w assistive devices. Discussed progress towards all OT goals since commencing CDT. All questions answered to the Pt's satisfaction. Good return. Person educated: Patient Education method: Explanation, Demonstration, and Handouts Education comprehension: verbalized understanding, returned demonstration, and needs further education  LE SELF-CARE HOME  PROGRAM: Simple self-MLD/daily to affected quadrant and body part At least 2 x daily BLE Lymphatic Pumping There ex 1 set of 10 reps each, in order, bilaterally Daily skin care to affected body part to limit infection risk and increase skin excursion Compression Bandaging Intensive stage compression: multilayer short stretch wraps with gradient techniques. One limb at a time. Length patient dependent. Self-management Phase: Appropriate daytime compression garment and  hours-of-sleep device  Compression garments: Custom-made gradient compression garments and hours-of-sleep devices are medically necessary because they are uniquely sized and shaped to fit the exact dimensions of the affected extremities and to provide accurate and consistent gradient compression and containment, essential  for optimally managing chronic, progressive lymphedema. The convoluted HOS devices are medically necessary to facilitate increased lymphatic circulation and limit fibrosis formation when sleeping. Multiple custom compression garments are needed for optimal hygiene to limit infection risk. Custom compression garments should be replaced q 3-6 months  When worn consistently for optimal lipo-lymphedema self-management over time.  Pt will benefit from [id number]- Circaid reduction kit whole leg to increase independence with lymphedema self care, to  reduce limb volume  for body symmetry and balance, and to limit infection risk and progression.  ASSESSMENT:  CLINICAL IMPRESSION: After skilled teaching Pt able to perform simple self MLD short neck sequence, inguinal - axillary anastomosis strokes, inguinal MLD and thigh sequence with moderate assistance, including demonstration and verbal and manual cues. Pt will practice between sessions and we'll review next visit. Pt tolerated MLD to RLE/RLQ without increased pain. We also used deep fibrosis techniques to distal leg and medial malleolus with palpable softening in tissue density after session.Applied multilayer compression wraps as established using gradient techniques.  Cont as per POC.    10/10/23 Lymphedema Episode 2, Initial OT Evaluation : Dinora Hemm is a 68 y a female presenting with chronic, progressive, moderate, Stage II, RLE/RLQ cancer -related lymphedema with onset 11 years ago after Rx for endometrial cancer. Pt is well known to this therapist as she has successfully undergone Complete Decongestive Therapy (CDT)  this clinic bin the past. Pt reports recent exacerbation of RLE swelling worsened as inflammation in L hip has worsened over time.  Pt has a hip replacement scheduled in late December here it [facility]. RE limb volumetrics today reveal that R LEG volume is dramatically increased by 59.65 since last visit. R LEG VOLUME is INCREASED in volume by 59.6%. L THIGH is INCREASED by 38 %, and RLE full limb is INCREASED by 45.38%. Pt presents with LE-related skin changes. Prolonged inflammation in the R hip joint has likely overloaded  already RLE/RLQ lymphatics.  RLE/RLQ lymphedema limits Pt's ability to perform basic and instrumental ADLs, including functional ambulation, mobility and transfers,  grooming , lower body bathing and dressing, skin inspection and skin care. Pt has difficulty  reaching her lower legs and feet to apply compression wraps and don/doff        compression stockings. LE limits ability to P[perform instrumental ADLs, including driving, yard work and home management activities. Lymphedema limits her ability to participate in leisure pursuits and productive activities, and it negatively impacts body image, life roles and quality of life.   Pt will benefit from an Intensive and follow along course of lymphedema. CDT will consist of Manual Lymphatic gainage   (MLD), skin care, therapeutic exercise and compression, wraps initially then garments. Pt will need assistance throughout CDT   for applying compression wraps due to limited hip AROM and decreased skin flexibility. Without skilled Occupational Therapy for lymphedema care, lymphedema will progress and further functional decline is expected.   In prep for upcoming hip replacement OT will educate Pt re assistive devices, including tub transfer bench and elevated toilet seat, in keeping with hip precautions.  OBJECTIVE IMPAIRMENTS: Abnormal gait, decreased activity tolerance, decreased balance, decreased knowledge of use of DME, decreased mobility, difficulty walking, decreased ROM, decreased strength, increased edema, decreased skin flexibility,  increased fascial restrictions, impaired sensation, pain, and chronic, progressive LLE/LLQ lymphatic swelling and associated pain.   ADL LIMITATIONS: carrying, lifting, bending, sitting, standing, squatting, sleeping, stairs, transfers, bed mobility, bathing, dressing, hygiene/grooming, and productive activities, leisure pursuits, social participation, body image  driving, shopping, housework, yard work, cooking, meal prep  PERSONAL FACTORS: Past/current experiences and 3+ comorbidities: Morphea Scleroderma, Mixed connective tissue disease, and osteoarthritis  are also affecting  patient's functional outcome.   REHAB POTENTIAL: Good  EVALUATION COMPLEXITY: Moderate  GOALS: Goals reviewed with patient? Yes  SHORT TERM GOALS: Target date: 4th OT Rx visit   Pt will demonstrate understanding of lymphedema precautions and prevention strategies with modified independence using a printed reference to identify at least 5 precautions and discussing how s/he may implement them into daily life to reduce risk of progression with extra time.  Baseline:Max A Goal status: GOAL MET  2.  Pt will be able to apply multilayer, knee length, gradient, compression wraps to one leg at a time with modified assistance (extra time and assistive device/s) to decrease limb volume, to limit infection risk, and to limit lymphedema progression.  Baseline: Dependent Goal status: GOAL MET  LONG TERM GOALS: Target date: 01/09/24 (12 weeks)  Given this patient's Intake score 36% on the functional outcomes FOTO tool, patient will experience an increase in function of 3 points to improve basic and instrumental ADLs performance, including lymphedema self-care.  Baseline: Max A Goal status: DEFERRED as Janyth Contes has been discontinued  2.  Given this patient's Intake score of  75% on the Lymphedema Life Impact Scale (LLIS), patient will experience a reduction of at least 5 points in her perceived level of functional impairment resulting from lymphedema to improve functional performance and quality of life (QOL). Baseline: % Goal status: PROGRESSING  3.  Pt will achieve at least a 10% volume reduction in full, RLE limb volume to return limb to typical size and shape, to limit infection risk and LE progression, to decrease pain, to improve function. Baseline: Dependent Goal status:PROGRESSING. Volumetrics deferred today by Pt request in order to increase time for manual therapy which provides pain relief. Volumetrics next visit. By visual assessment I would estimate ~10% volume reduction for entire RLE to DATE.  tissue DENSITY IN THIGH HAS REDUCED MILDLY AND SLIGHTLY IN LEG.   4.  Pt will obtain proper compression garments/devices and achieve modified independence (extra time + assistive devices) with donning/doffing to optimize limb volume reductions and limit LE  progression over time. Baseline:  Goal status: 01/04/24: PARTIALLY MET; In an effort to reduce burden of care on her spouse, Pt obtained adjustable, Velcro style R full leg, Circaid leg reduction kit for use s/p THA, but this proved not effective for controlling swelling, and were not easier to don and doff more independently. We've resumed wrapping until she is able to fit back into custom compression garments.  5.  During Intensive phase CDT , with modified independence, Pt will achieve at least 85% compliance with all lymphedema self-care home program components, including daily skin care, compression wraps and /or garments, simple self MLD and lymphatic pumping therex to habituate LE self care protocol  into ADLs for optimal LE self-management over time. Baseline: Dependent Goal status: ONGOING PLAN:  OT FREQUENCY: 2x/week  OT DURATION: 12 weeks and PRN  PLANNED INTERVENTIONS: Complete Decongestive Therapy (Intensive and supported Self-Management Phases), 97110-Therapeutic exercises, 97530- Therapeutic activity, 97535- Self Care, 16109- Manual therapy, Patient/Family education, Taping, Manual lymph drainage, Scar mobilization, Compression bandaging, DME  instructions, and skin care to reduce infection risk throughout manual therapy. Fit with replacement compression garments ASAP  PLAN FOR NEXT SESSION:  Complete RLE comparative limb volumetrics for progress note update Cont RLE/RLQ MLD Cont multilayer compression wrapping    Loel Dubonnet, MS, OTR/L, CLT-LANA 02/01/24 4:02 PM  02/01/2024, 4:02 PM

## 2024-02-03 NOTE — Therapy (Signed)
 OUTPATIENT PHYSICAL THERAPY TREATMENT   Patient Name: Elizabeth Martin MRN: [id number] DOB:August 25, 1961, 63 y.o., female Today's Date: 02/06/2024  END OF SESSION:  PT End of Session - 02/06/24 1016     Visit Number 7    Number of Visits 16    Date for PT Re-Evaluation 03/01/24    Authorization Type [employer]    Progress Note Due on Visit 10    PT Start Time 1015    PT Stop Time 1059    PT Time Calculation (min) 44 min    Equipment Utilized During Treatment Gait belt    Activity Tolerance Patient tolerated treatment well;No increased pain    Behavior During Therapy WFL for tasks assessed/performed                 Past Medical History:  Diagnosis Date   Anxiety    Asthma    Autoimmune disease (HCC)    Cancer (HCC)    Endometrial lymph node removal (30)   Corneal abrasion, right 05/24/2022   Depression    GERD (gastroesophageal reflux disease)    H/O blood clots    upper rt groin and behind both knees   History of hiatal hernia    Hypertension    Lymphedema    right leg   Morphea scleroderma    PONV (postoperative nausea and vomiting)    states gets violently ill   Past Surgical History:  Procedure Laterality Date   BASAL CELL CARCINOMA EXCISION Left    CHOLECYSTECTOMY  2008   FLEXIBLE SIGMOIDOSCOPY N/A 05/19/2022   Procedure: FLEXIBLE SIGMOIDOSCOPY;  Surgeon: Toledo, Boykin Nearing, MD;  Location: [facility];  Service: Gastroenterology;  Laterality: N/A;   HERNIA REPAIR  2004   ILEOSTOMY CLOSURE N/A 08/24/2022   Procedure: ILEOSTOMY TAKEDOWN, open loop with Lynden Oxford, PA-C to assist;  Surgeon: Henrene Dodge, MD;  Location: [facility] OR[facility];  Service: General;  Laterality: N/A;   IVC FILTER INSERTION N/A 11/09/2023   Procedure: IVC FILTER INSERTION;  Surgeon: Annice Needy, MD;  Location: [facility];  Service: Cardiovascular;  Laterality: N/A;   IVC FILTER REMOVAL N/A 01/02/2024   Procedure: IVC FILTER REMOVAL;  Surgeon: Annice Needy, MD;  Location: [facility]  INVASIVE CV LAB;  Service: Cardiovascular;  Laterality: N/A;   PLANTAR FASCIECTOMY     ROBOTIC ASSISTED TOTAL HYSTERECTOMY WITH BILATERAL SALPINGO OOPHERECTOMY  02/14/2012   ROTATOR CUFF REPAIR Left 03/24/2022   TONSILLECTOMY     TOTAL HIP ARTHROPLASTY Right 11/14/2023   Procedure: TOTAL HIP ARTHROPLASTY - posterior;  Surgeon: Reinaldo Berber, MD;  Location: [facility] OR[facility];  Service: Orthopedics;  Laterality: Right;   XI ROBOTIC ASSISTED LOWER ANTERIOR RESECTION N/A 05/24/2022   Procedure: XI ROBOTIC ASSISTED LOWER ANTERIOR RESECTION;  Surgeon: Henrene Dodge, MD;  Location: [facility] OR[facility];  Service: General;  Laterality: N/A;   Patient Active Problem List   Diagnosis Date Noted   Constipation 01/10/2024   Nausea & vomiting 01/10/2024   S/P total right hip arthroplasty 11/14/2023   Blood coagulation disorder (HCC) 11/10/2023   PTSD (post-traumatic stress disorder) 11/10/2023   Severe episode of recurrent major depressive disorder, without psychotic features (HCC) 11/10/2023   GAD (generalized anxiety disorder) 11/10/2023   High risk medication use 11/10/2023   Anxiety and depression 03/22/2023   Cervical radiculopathy 03/16/2023   Colon cancer screening 03/15/2023   Chronic idiopathic constipation 03/15/2023   Dyspnea 03/07/2023   [place] spotted fever 03/07/2023   Strain of gastrocnemius tendon 03/07/2023  Lower abdominal pain 03/07/2023   Pain in pelvis 03/07/2023   Small bowel obstruction (HCC) 02/11/2023   Raynaud disease 02/11/2023   Polyarthralgia 02/11/2023   Edema 01/03/2023   Diverticular disease 12/10/2022   Hot flashes 12/10/2022   Pain of right lower extremity 12/10/2022   Paresthesia of lower extremity 12/10/2022   Primary insomnia 12/10/2022   Thyroid nodule 12/10/2022   Multiple joint pain 12/10/2022   Basal cell carcinoma of skin 12/10/2022   Acute bilateral low back pain without sciatica 11/10/2022   Chronic lower back pain 09/05/2022   S/P closure of ileostomy  08/24/2022   Ileostomy in place [facility])    Morphea 07/13/2022   Colonic stricture (HCC) 05/19/2022   Large bowel obstruction (HCC) 05/19/2022   Hypokalemia 05/18/2022   Abdominal pain 05/18/2022   Osteoarthritis of left knee 02/24/2022   Mixed connective tissue disease (HCC) 02/17/2022   Adhesive capsulitis of left shoulder 01/27/2022   Glenohumeral arthritis, left 01/27/2022   Microscopic hematuria 01/12/2022   Otorrhagia of right ear 12/10/2021   Elevated testosterone level in female 11/04/2021   Anti-TPO antibodies present 10/20/2021   Rash 10/13/2021   Hashimoto thyroiditis, 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   Hashimoto's disease 03/23/2021   Exposure to severe acute respiratory syndrome coronavirus 2 (SARS-CoV-2) 12/19/2020   Migraine without aura and without status migrainosus, not intractable 08/19/2020   Myofascial pain 07/30/2020   Neck pain 07/30/2020   Upper back pain 07/30/2020   Cervical facet joint syndrome 07/30/2020   Hair loss 07/14/2020   Edema of lower extremity 07/11/2020   Adenomatous colon polyp 02/26/2020   Gastroesophageal reflux disease 02/15/2020   Diarrhea 02/15/2020   Sleep disorder 01/16/2020   Hyperlipidemia 07/11/2019   Right shoulder pain 04/04/2019   Pre-operative clearance 12/13/2018   Endometrial cancer (HCC) 04/05/2018   Patellofemoral pain syndrome of right knee 03/20/2018   Gastroenteritis 12/07/2017   Fatigue 11/16/2017   Morbid obesity (HCC) 08/11/2017   Stucco keratoses 08/11/2017   Essential hypertension 08/09/2017   History of endometrial cancer 08/09/2017   Lymphedema 08/09/2017   Menopause 08/09/2017   Malignant neoplastic disease (HCC) 04/07/2017   Cellulitis 09/18/2016   Benign hypertension 06/02/2016   Asthma 06/04/1969    PCP: Pennie Banter MD   REFERRING PROVIDER: Pennie Banter MD   REFERRING DIAG: s/p R hip replacement  THERAPY DIAG:  Pain in right hip  Stiffness of right hip, not elsewhere classified  Difficulty  in walking, not elsewhere classified  Rationale for Evaluation and Treatment: Rehabilitation  ONSET DATE: 11/14/23  SUBJECTIVE:   SUBJECTIVE STATEMENT: Patient reports her hip pain worsened over the weekend. Feels sore after PT.   Will be leaving for [place] on 3/19 for a 10 day trip.     PERTINENT HISTORY: s/p R hip arthropaslasty 11/14/23 with Dr. Audelia Acton posterior approach, blood coagulation disorder, PTSD, anxiety, cervical radiculopathy dypsnea, polyarthralgia, Raynaud disease, s/p closure of ileostomy, Hashimoto thyroid, endometrial cancer, menopause, endometrial cancer, lymphedema. Patient had home health PT prior to PT here.   PAIN:  Are you having pain?  4/10 Rt hip joint pain, posterior portion of hip.    PRECAUTIONS: Other: recovering from hip sx  RED FLAGS: None   WEIGHT BEARING RESTRICTIONS: No  FALLS:  Has patient fallen in last 6 months? Yes. Number of falls 2  LIVING ENVIRONMENT: Lives with: lives with their spouse Lives in: House/apartment Stairs: No Has following equipment at home: Single point cane  OCCUPATION: retired,   PLOF: Independent  PATIENT GOALS: return  to hiking, working out, gardening.   NEXT MD VISIT: March 2025   OBJECTIVE:  Note: Objective measures were completed at Evaluation unless otherwise noted.  DIAGNOSTIC FINDINGS:   IMPRESSION: Right hip replacement.  No visible complicating feature.  PATIENT SURVEYS:  LEFS 5/80  COGNITION: Overall cognitive status: Within functional limits for tasks assessed    MUSCLE LENGTH: Hamstrings: limited bilaterally with R>L   LOWER EXTREMITY ROM:  Passive ROM Right eval Left eval  Hip flexion 48 flexion PROM supine   Hip extension    Hip abduction 5 degrees AROM     (Blank rows = not tested)  LOWER EXTREMITY MMT:  MMT Right eval Left eval  Hip flexion 2+ 5  Hip extension  5  Hip abduction 2+* painful 5  Hip adduction 2+ 5  Hip internal rotation    Hip external rotation     Knee flexion 3 5  Knee extension 3 5  Ankle dorsiflexion 3 5  Ankle plantarflexion 3 5   (Blank rows = not tested)    FUNCTIONAL TESTS:  5 times sit to stand: 29 6 minute walk test:  10 meter walk test: 15 seconds with SPC   GAIT: Distance walked: 60 ft Assistive device utilized: Single point cane Level of assistance: CGA Comments: antalgic gait pattern with decreased stance time RLE. Slight vaulting pattern.  Stair negotiation: -step to pattern lead with LLE, one hand on cane one hand on rail ascending. Descending step to pattern down with RLE.                                                                                                                               TREATMENT DATE: 02/06/24  TA to improve BLE ROM and allow improved independence with ADLs and community mobility.   In // bars High knee march 4x length  Lateral step 4x length of // bars  Hedgehogs: tap between blue and pink cone 10x each side Step over yellow line 10x each LE; focus on weight shift and control of foot placement  Seated: Dynadisc: -df/pf 10x -eversion/inversion10x -clockwise circle 10  Neuro Re-ed: Standing with CGA next to support surface:  Airex pad: static stand 30 seconds x 2 trials, noticeable trembling of ankles/LE's with fatigue and challenge to maintain stability Airex pad: horizontal head turns  10x ; cueing for arc of motion  Airex pad: vertical head turns 30 seconds, cueing for arc of motion, noticeable sway with upward gaze increasing demand on ankle righting reaction musculature Airex pad: one foot on 6" step one foot on airex pad, hold position for 30 seconds, switch legs, 2x each LE;   Pt required multiple restroom breaks throughout session due to upset stomach, limiting skilled intervention   PATIENT EDUCATION:  Education details: goals, POC, HEP Person educated: Patient Education method: Explanation, Demonstration, Tactile cues, and Verbal cues Education  comprehension: verbalized understanding, returned demonstration, verbal cues required, tactile cues required, and needs further  education  HOME EXERCISE PROGRAM: Access Code: [facility] URL: [email or web address] Date: 01/11/2024 Prepared by: Thresa Ross  Exercises - Side Stepping with Counter Support  - 1 x daily - 7 x weekly - 2 sets - 10 reps - 5 hold - Standing Hip Extension  - 1 x daily - 7 x weekly - 2 sets - 10 reps - 5 hold - Standing March with Counter Support  - 1 x daily - 7 x weekly - 2 sets - 10 reps - 5 hold - Standing Hip Abduction with Counter Support  - 1 x daily - 7 x weekly - 2 sets - 10 reps - Seated Hamstring Stretch  - 1 x daily - 7 x weekly - 2 sets - 2 reps - 30 hold - Sit to Stand  - 1 x daily - 7 x weekly - 2 sets - 10 reps - 5 hold - Supine Heel Slide  - 1 x daily - 7 x weekly - 2 sets - 6 reps - Supine Gluteal Sets  - 1 x daily - 7 x weekly - 2 sets - 10 reps  ASSESSMENT: CLINICAL IMPRESSION:  Patient presents with good motivation.  She does have pain with RLE high knee movements. Single leg stance is challenging but improving with decreased hyperextension and improved control. Ankle stabilization tolerated well.  Pt will continue to benefit from skilled physical therapy intervention to address impairments, improve QOL, and attain therapy goals.    OBJECTIVE IMPAIRMENTS: Abnormal gait, decreased activity tolerance, decreased balance, decreased coordination, decreased endurance, decreased mobility, difficulty walking, decreased ROM, decreased strength, hypomobility, increased edema, impaired perceived functional ability, impaired flexibility, improper body mechanics, postural dysfunction, and pain.   ACTIVITY LIMITATIONS: carrying, lifting, bending, sitting, standing, squatting, sleeping, stairs, transfers, bed mobility, bathing, toileting, dressing, reach over head, hygiene/grooming, locomotion level, and caring for others  PARTICIPATION  LIMITATIONS: meal prep, cleaning, laundry, interpersonal relationship, driving, shopping, community activity, and yard work  PERSONAL FACTORS: Age, Past/current experiences, Time since onset of injury/illness/exacerbation, and 3+ comorbidities: blood coagulation disorder, PTSD, anxiety, cervical radiculopathy dypsnea, polyarthralgia, Raynaud disease, s/p closure of ileostomy, Hashimoto thyroid, endometrial cancer, menopause, endometrial cancer, lymphedema  are also affecting patient's functional outcome.   REHAB POTENTIAL: Good  CLINICAL DECISION MAKING: Evolving/moderate complexity  EVALUATION COMPLEXITY: Moderate   GOALS: Goals reviewed with patient? Yes  SHORT TERM GOALS: Target date: 01/19/2024  Patient will be independent in home exercise program to improve strength/mobility for better functional independence with ADLs. Baseline:1/30: HEP given Goal status: INITIAL  LONG TERM GOALS: Target date: 03/01/2024  Patient (> 49 years old) will complete five times sit to stand test in < 10 seconds indicating an increased LE strength and improved balance. Baseline: 1/30: 29 seconds Goal status: INITIAL  2.  Patient will increase six minute walk test distance to >1000 for progression to community ambulator and improve gait ability Baseline: 430 ft in 5 min and required seated rest to end test due to fatigue, uses SPC Goal status: INITIAL  3.  Patient will increase 10 meter walk test to >1.50m/s as to improve gait speed for better community ambulation and to reduce fall risk. Baseline: 1/30: 15 seconds with SPC  Goal status: INITIAL  4.  Patient will increase lower extremity functional scale to >60/80 to demonstrate improved functional mobility and increased tolerance with ADLs.  Baseline: 1/30: 5/80 Goal status: INITIAL  PLAN:  PT FREQUENCY: 2x/week PT DURATION: 8 weeks PLANNED INTERVENTIONS: 97164- PT Re-evaluation, 97110-Therapeutic exercises, 97530- Therapeutic activity,  16109-  Neuromuscular re-education, [phone]- Self Care, 09811- Manual therapy, [id number]- Gait training, [id number]- Splinting, 97014- Electrical stimulation (unattended), [id number]- Electrical stimulation (manual), [id number]- Ultrasound, 91478- Traction (mechanical), Patient/Family education, Balance training, Stair training, Taping, Joint mobilization, Joint manipulation, Spinal manipulation, Spinal mobilization, Manual lymph drainage, Scar mobilization, Compression bandaging, Vestibular training, Visual/preceptual remediation/compensation, DME instructions, Cryotherapy, and Moist heat  PLAN FOR NEXT SESSION:   Dynamic gait and  R hip strength and ROM,  11:08 AM, 02/06/24    Precious Bard, PT 02/06/2024, 11:08 AM

## 2024-02-05 ENCOUNTER — Other Ambulatory Visit: Payer: Self-pay | Admitting: Psychiatry

## 2024-02-05 DIAGNOSIS — F4310 Post-traumatic stress disorder, unspecified: Secondary | ICD-10-CM

## 2024-02-06 ENCOUNTER — Ambulatory Visit

## 2024-02-06 ENCOUNTER — Ambulatory Visit: Attending: Family Medicine | Admitting: Occupational Therapy

## 2024-02-06 DIAGNOSIS — R262 Difficulty in walking, not elsewhere classified: Secondary | ICD-10-CM

## 2024-02-06 DIAGNOSIS — M25651 Stiffness of right hip, not elsewhere classified: Secondary | ICD-10-CM

## 2024-02-06 DIAGNOSIS — M25551 Pain in right hip: Secondary | ICD-10-CM | POA: Diagnosis present

## 2024-02-06 DIAGNOSIS — I890 Lymphedema, not elsewhere classified: Secondary | ICD-10-CM | POA: Diagnosis present

## 2024-02-06 NOTE — Therapy (Signed)
 OUTPATIENT OCCUPATIONAL THERAPY TREATMENT NOTE   LOWER EXTREMITY LYMPHEDEMA  Patient Name: Elizabeth Martin MRN: [id number] DOB:Jul 03, 1961, 63 y.o., female Today's Date: 02/06/2024   END OF SESSION:  Lymphedema Episode 2   OT End of Session - 02/06/24 1240     Visit Number 17    Number of Visits 36    Date for OT Re-Evaluation 04/16/24    OT Start Time 0905    OT Stop Time 1010    OT Time Calculation (min) 65 min    Activity Tolerance Patient tolerated treatment well;No increased pain    Behavior During Therapy WFL for tasks assessed/performed              Past Medical History:  Diagnosis Date   Anxiety    Asthma    Autoimmune disease (HCC)    Cancer (HCC)    Endometrial lymph node removal (30)   Corneal abrasion, right 05/24/2022   Depression    GERD (gastroesophageal reflux disease)    H/O blood clots    upper rt groin and behind both knees   History of hiatal hernia    Hypertension    Lymphedema    right leg   Morphea scleroderma    PONV (postoperative nausea and vomiting)    states gets violently ill   Past Surgical History:  Procedure Laterality Date   BASAL CELL CARCINOMA EXCISION Left    CHOLECYSTECTOMY  2008   FLEXIBLE SIGMOIDOSCOPY N/A 05/19/2022   Procedure: FLEXIBLE SIGMOIDOSCOPY;  Surgeon: Toledo, Boykin Nearing, MD;  Location: [facility];  Service: Gastroenterology;  Laterality: N/A;   HERNIA REPAIR  2004   ILEOSTOMY CLOSURE N/A 08/24/2022   Procedure: ILEOSTOMY TAKEDOWN, open loop with Lynden Oxford, PA-C to assist;  Surgeon: Henrene Dodge, MD;  Location: [facility];  Service: General;  Laterality: N/A;   IVC FILTER INSERTION N/A 11/09/2023   Procedure: IVC FILTER INSERTION;  Surgeon: Annice Needy, MD;  Location: [facility];  Service: Cardiovascular;  Laterality: N/A;   IVC FILTER REMOVAL N/A 01/02/2024   Procedure: IVC FILTER REMOVAL;  Surgeon: Annice Needy, MD;  Location: [facility];  Service: Cardiovascular;  Laterality:  N/A;   PLANTAR FASCIECTOMY     ROBOTIC ASSISTED TOTAL HYSTERECTOMY WITH BILATERAL SALPINGO OOPHERECTOMY  02/14/2012   ROTATOR CUFF REPAIR Left 03/24/2022   TONSILLECTOMY     TOTAL HIP ARTHROPLASTY Right 11/14/2023   Procedure: TOTAL HIP ARTHROPLASTY - posterior;  Surgeon: Reinaldo Berber, MD;  Location: [facility];  Service: Orthopedics;  Laterality: Right;   XI ROBOTIC ASSISTED LOWER ANTERIOR RESECTION N/A 05/24/2022   Procedure: XI ROBOTIC ASSISTED LOWER ANTERIOR RESECTION;  Surgeon: Henrene Dodge, MD;  Location: [facility];  Service: General;  Laterality: N/A;   Patient Active Problem List   Diagnosis Date Noted   Constipation 01/10/2024   Nausea & vomiting 01/10/2024   S/P total right hip arthroplasty 11/14/2023   Blood coagulation disorder (HCC) 11/10/2023   PTSD (post-traumatic stress disorder) 11/10/2023   Severe episode of recurrent major depressive disorder, without psychotic features (HCC) 11/10/2023   GAD (generalized anxiety disorder) 11/10/2023   High risk medication use 11/10/2023   Anxiety and depression 03/22/2023   Cervical radiculopathy 03/16/2023   Colon cancer screening 03/15/2023   Chronic idiopathic constipation 03/15/2023   Dyspnea 03/07/2023   [facility] spotted fever 03/07/2023   Strain of gastrocnemius tendon 03/07/2023   Lower abdominal pain 03/07/2023   Pain in pelvis 03/07/2023   Small bowel obstruction (HCC)  02/11/2023   Raynaud disease 02/11/2023   Polyarthralgia 02/11/2023   Edema 01/03/2023   Diverticular disease 12/10/2022   Hot flashes 12/10/2022   Pain of right lower extremity 12/10/2022   Paresthesia of lower extremity 12/10/2022   Primary insomnia 12/10/2022   Thyroid nodule 12/10/2022   Multiple joint pain 12/10/2022   Basal cell carcinoma of skin 12/10/2022   Acute bilateral low back pain without sciatica 11/10/2022   Chronic lower back pain 09/05/2022   S/P closure of ileostomy 08/24/2022   Ileostomy in place [facility])    Morphea  07/13/2022   Colonic stricture (HCC) 05/19/2022   Large bowel obstruction (HCC) 05/19/2022   Hypokalemia 05/18/2022   Abdominal pain 05/18/2022   Osteoarthritis of left knee 02/24/2022   Mixed connective tissue disease (HCC) 02/17/2022   Adhesive capsulitis of left shoulder 01/27/2022   Glenohumeral arthritis, left 01/27/2022   Microscopic hematuria 01/12/2022   Otorrhagia of right ear 12/10/2021   Elevated testosterone level in female 11/04/2021   Anti-TPO antibodies present 10/20/2021   Rash 10/13/2021   Hashimoto thyroiditis, 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   Hashimoto's disease 03/23/2021   Exposure to severe acute respiratory syndrome coronavirus 2 (SARS-CoV-2) 12/19/2020   Migraine without aura and without status migrainosus, not intractable 08/19/2020   Myofascial pain 07/30/2020   Neck pain 07/30/2020   Upper back pain 07/30/2020   Cervical facet joint syndrome 07/30/2020   Hair loss 07/14/2020   Edema of lower extremity 07/11/2020   Adenomatous colon polyp 02/26/2020   Gastroesophageal reflux disease 02/15/2020   Diarrhea 02/15/2020   Sleep disorder 01/16/2020   Hyperlipidemia 07/11/2019   Right shoulder pain 04/04/2019   Pre-operative clearance 12/13/2018   Endometrial cancer (HCC) 04/05/2018   Patellofemoral pain syndrome of right knee 03/20/2018   Gastroenteritis 12/07/2017   Fatigue 11/16/2017   Morbid obesity (HCC) 08/11/2017   Stucco keratoses 08/11/2017   Essential hypertension 08/09/2017   History of endometrial cancer 08/09/2017   Lymphedema 08/09/2017   Menopause 08/09/2017   Malignant neoplastic disease (HCC) 04/07/2017   Cellulitis 09/18/2016   Benign hypertension 06/02/2016   Asthma 06/04/1969    PCP: Pennie Banter, MD  REFERRING PROVIDER: Pennie Banter, MD  REFERRING DIAG: I89.0   THERAPY DIAG:  Lymphedema, not elsewhere classified  Rationale for Evaluation and Treatment: Rehabilitation  ONSET DATE: 2013  (Cancer-related, endometrial  2013)  SUBJECTIVE:                                                                                                                                                                                           SUBJECTIVE STATEMENT:Patient presents to s/p R hip arthroplasty 11/14/23.  Pt presents to OT for cancer-related lymphedema care to RLE/RLQ after exacerbation following THA. Pt is very motivated to improve lymphedema prior to her upcoming , long awaited trip to [place] with her spouse. Pt reports she is in increased pain this morning w increased soreness in the R hip, tightness in the inguinal region and lower extremity, and new onset neck pain. Pt does not rate pain numerically.  PERTINENT HISTORY: Asthma, Autoimmune Disease, Endometrial Ca w/ LND ( 30 bilateral pelvic and periaortic LN), adjuvant chemotherapy and XRT,   H/O blood clots, RLE/RLQ lymphedema, Robotic assisted total hysterectomy w/ bilateral oophorectomy L Rotator Cuff repair, S/P ileostomy 2/2 colon stricture 05/2022 R hip arthroplasty scheduled 11/11/23.   PAIN:  Are you having pain? YES. See subjective; not rated /10 Pain location: RLE, R hip, cervical spine Pain description: sore, aching, heavy, full, tight Aggravating factors: standing, walking, extended dependent sitting Relieving factors: elevation, movement  PRECAUTIONS: Fall and Other: LYMPHEDEMA  WEIGHT BEARING RESTRICTIONS: Yes 5# lifting restriction- shoulder  FALLS:  Has patient fallen in last 6 months? No  LIVING ENVIRONMENT: Lives with: lives with their spouse Lives in: House/apartment Stairs: No;  Has following equipment at home: Single point cane, Environmental consultant - 2 wheeled, Environmental consultant - 4 wheeled, and Wheelchair (manual)  OCCUPATION: retired Control and instrumentation engineer: gardening, hiking, biking, dancing- unable to participate in any of these due to pain and swelling  HAND DOMINANCE: right   PRIOR LEVEL OF FUNCTION: Independent with basic ADLs, Independent with household  mobility without device, Independent with community mobility without device, Requires assistive device for independence, Needs assistance with homemaking, Needs assistance with transfers, and Leisure: decreased  social participation for leisure pursuits due to impaired mobility and pain  PATIENT GOALS:  Be able to move freely without pain Reduce limb volume to be able to lift limb for basic daily ADLs and functional ambulation Reduce limb volume to increase ability to perform lower body dressing and bathing and grooming Reduce limb to increase body image  OBJECTIVE: Note: Objective measures were completed at Evaluation unless otherwise noted.  COGNITION:  Overall cognitive status: Within functional limits for tasks assessed   OBSERVATIONS / OTHER ASSESSMENTS:   POSTURE: WFL  LE ROM: WFL, but Limited mildly at R knee and ankle due to girth, skin approximation, and joint pain  LE MMT: WFL  Mild, Stage  II, Bilateral Lower Extremity Lymphedema 2/2 CVI and Obesity  Skin  Description Hyper-Keratosis Peau d' Orange Shiny Tight Fibrotic/ Indurated Fatty Doughy Spongy/ boggy   x x x x R>L   x   Skin dry Flaky WNL Macerated   mildly sclerotic     Color Redness Varicosities Blanching Hemosiderin Stain Mottled    x x   x   Odor Malodorous Yeast Fungal infection  WNL      x   Temperature Warm Cool wnl    x     Pitting Edema   1+ 2+ 3+ 4+ Non-pitting         x   Girth Symmetrical Asymmetrical                   Distribution    R>L RLE toes to groin    Stemmer Sign Positive Negative   +    Lymphorrhea History Of:  Present Absent     x    Wounds History Of Present Absent Venous Arterial Pressure Sheer     x        Signs of Infection  Redness Warmth Erythema Acute Swelling Drainage Borders                    Sensation Light Touch Deep pressure Hypersensitivity   In tact Impaired In tact Impaired Absent Impaired   x  x  x     Nails WNL   Fungus nail dystrophy    x     Hair Growth Symmetrical Asymmetrical    R>L   Skin Creases Base of toes  Ankles   Base of Fingers knees       Abdominal pannus Thigh Lobules  Face/neck   x x  x        BLE COMPARATIVE LIMB VOLUMETRICS 10/10/23  LANDMARK RIGHT  10/10/23  R LEG (A-D) 5466.5 ml  R THIGH (E-G) 9186.6 ml  R FULL LIMB (A-G) 14653.1 ml  Limb Volume differential (LVD)  LVD for LEG = 44.1%, R>L LVD for THIGH= 33.98%, R>L LVD for FULL lower extremity 37.8%, R>L  Volume change since last 08/11/22 R LEG is INCREASED in volume by 59.6%. L THIGH is INCREASED by 38 %, and RLE full limb is INCREASED by 45.38%.  Volume change overall V  (Blank rows = not tested)  LANDMARK LEFT  10/12/23  L LEG (A-D) 3053.6 ml  L THIGH (E-G) 6064.8 ml  L  FULL LIMB (A-G) 9118.4 ml  Limb Volume differential (LVD)  %  Volume change since initial %  Volume change overall %  (Blank rows = not tested)   10 th VISIT PROGRESS NOTE RLE COMPARATIVE LIMB VOLUMETRICS 01/04/24: Deferred until next visit by Pt request.  TBA next session  LANDMARK RIGHT    R LEG (A-D) TBA  R THIGH (E-G) TBA  R FULL LIMB (A-G) TBA  Limb Volume differential (LVD)    Volume change since last 08/11/22 TBA  Volume change overall TBA  (Blank rows = not tested)    CA-related LYMPHEDEMA Hx:  SURGERY TYPE/DATE: 2013 NUMBER OF LYMPH NODES REMOVED: 30 by report- bilateral pelvic and periaortic CHEMOTHERAPY: yes RADIATION:yes INFECTIONS: no hx cellulitis GAIT: Distance walked: >500 ft Assistive device utilized: single point cane Level of assistance: Modified independence- extra time Comments: R limp  LYMPHEDEMA LIFE IMPACT SCALE (LLIS): Intake 10/12/23 75% (The extent to which lymphedema related problems impacted your life over the past week)  FOTO (functional outcome measure):10/10/23 INTAKE: 36%  PATIENT EDUCATION:  Education details: Continued Pt/ CG edu for lymphedema self care home program throughout session. Topics include outcome of  comparative limb volumetrics- starting limb volume differentials (LVDs), technology and gradient techniques used for short stretch, multilayer compression wrapping, simple self-MLD, therapeutic lymphatic pumping exercises, skin/nail care, LE precautions,. compression garment recommendations and specifications, wear and care schedule and compression garment donning / doffing w assistive devices. Discussed progress towards all OT goals since commencing CDT. All questions answered to the Pt's satisfaction. Good return. Person educated: Patient Education method: Explanation, Demonstration, and Handouts Education comprehension: verbalized understanding, returned demonstration, and needs further education  LE SELF-CARE HOME  PROGRAM: Simple self-MLD/daily to affected quadrant and body part At least 2 x daily BLE Lymphatic Pumping There ex 1 set of 10 reps each, in order, bilaterally Daily skin care to affected body part to limit infection risk and increase skin excursion Compression Bandaging Intensive stage compression: multilayer short stretch wraps with gradient techniques. One limb at a time. Length patient dependent. Self-management Phase: Appropriate daytime compression garment and hours-of-sleep device  Compression garments: Custom-made gradient compression garments and hours-of-sleep  devices are medically necessary because they are uniquely sized and shaped to fit the exact dimensions of the affected extremities and to provide accurate and consistent gradient compression and containment, essential  for optimally managing chronic, progressive lymphedema. The convoluted HOS devices are medically necessary to facilitate increased lymphatic circulation and limit fibrosis formation when sleeping. Multiple custom compression garments are needed for optimal hygiene to limit infection risk. Custom compression garments should be replaced q 3-6 months When worn consistently for optimal lipo-lymphedema  self-management over time.  Pt will benefit from [id number]- Circaid reduction kit whole leg to increase independence with lymphedema self care, to  reduce limb volume  for body symmetry and balance, and to limit infection risk and progression.  ASSESSMENT:  CLINICAL IMPRESSION: Pt tolerated MLD to RLE/RLQ without increased pain. We also used deep fibrosis techniques to distal leg and medial malleolus with palpable softening in tissue density after session.Applied multilayer compression wraps as established using gradient techniques.  Cont as per POC.    10/10/23 Lymphedema Episode 2, Initial OT Evaluation : Sherlyne Crownover is a 54 y a female presenting with chronic, progressive, moderate, Stage II, RLE/RLQ cancer -related lymphedema with onset 11 years ago after Rx for endometrial cancer. Pt is well known to this therapist as she has successfully undergone Complete Decongestive Therapy (CDT)  this clinic bin the past. Pt reports recent exacerbation of RLE swelling worsened as inflammation in L hip has worsened over time.  Pt has a hip replacement scheduled in late December here it [facility]. RE limb volumetrics today reveal that R LEG volume is dramatically increased by 59.65 since last visit. R LEG VOLUME is INCREASED in volume by 59.6%. L THIGH is INCREASED by 38 %, and RLE full limb is INCREASED by 45.38%. Pt presents with LE-related skin changes. Prolonged inflammation in the R hip joint has likely overloaded  already RLE/RLQ lymphatics.  RLE/RLQ lymphedema limits Pt's ability to perform basic and instrumental ADLs, including functional ambulation, mobility and transfers, grooming , lower body bathing and dressing, skin inspection and skin care. Pt has difficulty  reaching her lower legs and feet to apply compression wraps and don/doff        compression stockings. LE limits ability to P[perform instrumental ADLs, including driving, yard work and home management activities. Lymphedema limits her ability to  participate in leisure pursuits and productive activities, and it negatively impacts body image, life roles and quality of life.   Pt will benefit from an Intensive and follow along course of lymphedema. CDT will consist of Manual Lymphatic gainage   (MLD), skin care, therapeutic exercise and compression, wraps initially then garments. Pt will need assistance throughout CDT   for applying compression wraps due to limited hip AROM and decreased skin flexibility. Without skilled Occupational Therapy for lymphedema care, lymphedema will progress and further functional decline is expected.   In prep for upcoming hip replacement OT will educate Pt re assistive devices, including tub transfer bench and elevated toilet seat, in keeping with hip precautions.  OBJECTIVE IMPAIRMENTS: Abnormal gait, decreased activity tolerance, decreased balance, decreased knowledge of use of DME, decreased mobility, difficulty walking, decreased ROM, decreased strength, increased edema, decreased skin flexibility, increased fascial restrictions, impaired sensation, pain, and chronic, progressive LLE/LLQ lymphatic swelling and associated pain.   ADL LIMITATIONS: carrying, lifting, bending, sitting, standing, squatting, sleeping, stairs, transfers, bed mobility, bathing, dressing, hygiene/grooming, and productive activities, leisure pursuits, social participation, body image  driving, shopping, housework, yard work, cooking, meal prep  PERSONAL FACTORS:  Past/current experiences and 3+ comorbidities: Morphea Scleroderma, Mixed connective tissue disease, and osteoarthritis  are also affecting patient's functional outcome.   REHAB POTENTIAL: Good  EVALUATION COMPLEXITY: Moderate  GOALS: Goals reviewed with patient? Yes  SHORT TERM GOALS: Target date: 4th OT Rx visit   Pt will demonstrate understanding of lymphedema precautions and prevention strategies with modified independence using a printed reference to identify at least  5 precautions and discussing how s/he may implement them into daily life to reduce risk of progression with extra time.  Baseline:Max A Goal status: GOAL MET  2.  Pt will be able to apply multilayer, knee length, gradient, compression wraps to one leg at a time with modified assistance (extra time and assistive device/s) to decrease limb volume, to limit infection risk, and to limit lymphedema progression.  Baseline: Dependent Goal status: GOAL MET  LONG TERM GOALS: Target date: 01/09/24 (12 weeks)  Given this patient's Intake score 36% on the functional outcomes FOTO tool, patient will experience an increase in function of 3 points to improve basic and instrumental ADLs performance, including lymphedema self-care.  Baseline: Max A Goal status: DEFERRED as Janyth Contes has been discontinued  2.  Given this patient's Intake score of  75% on the Lymphedema Life Impact Scale (LLIS), patient will experience a reduction of at least 5 points in her perceived level of functional impairment resulting from lymphedema to improve functional performance and quality of life (QOL). Baseline: % Goal status: PROGRESSING  3.  Pt will achieve at least a 10% volume reduction in full, RLE limb volume to return limb to typical size and shape, to limit infection risk and LE progression, to decrease pain, to improve function. Baseline: Dependent Goal status:PROGRESSING. Volumetrics deferred today by Pt request in order to increase time for manual therapy which provides pain relief. Volumetrics next visit. By visual assessment I would estimate ~10% volume reduction for entire RLE to DATE. tissue DENSITY IN THIGH HAS REDUCED MILDLY AND SLIGHTLY IN LEG.   4.  Pt will obtain proper compression garments/devices and achieve modified independence (extra time + assistive devices) with donning/doffing to optimize limb volume reductions and limit LE  progression over time. Baseline:  Goal status: 01/04/24: PARTIALLY MET; In an effort to  reduce burden of care on her spouse, Pt obtained adjustable, Velcro style R full leg, Circaid leg reduction kit for use s/p THA, but this proved not effective for controlling swelling, and were not easier to don and doff more independently. We've resumed wrapping until she is able to fit back into custom compression garments.  5.  During Intensive phase CDT , with modified independence, Pt will achieve at least 85% compliance with all lymphedema self-care home program components, including daily skin care, compression wraps and /or garments, simple self MLD and lymphatic pumping therex to habituate LE self care protocol  into ADLs for optimal LE self-management over time. Baseline: Dependent Goal status: ONGOING PLAN:  OT FREQUENCY: 2x/week  OT DURATION: 12 weeks and PRN  PLANNED INTERVENTIONS: Complete Decongestive Therapy (Intensive and supported Self-Management Phases), 97110-Therapeutic exercises, 97530- Therapeutic activity, 97535- Self Care, 16109- Manual therapy, Patient/Family education, Taping, Manual lymph drainage, Scar mobilization, Compression bandaging, DME instructions, and skin care to reduce infection risk throughout manual therapy. Fit with replacement compression garments ASAP  PLAN FOR NEXT SESSION:  Complete RLE comparative limb volumetrics for progress note update Cont RLE/RLQ MLD Cont multilayer compression wrapping    Loel Dubonnet, MS, OTR/L, CLT-LANA 02/06/24 12:43 PM  02/06/2024, 12:43 PM

## 2024-02-07 ENCOUNTER — Ambulatory Visit: Admitting: Licensed Clinical Social Worker

## 2024-02-07 DIAGNOSIS — F4310 Post-traumatic stress disorder, unspecified: Secondary | ICD-10-CM | POA: Diagnosis not present

## 2024-02-07 DIAGNOSIS — F411 Generalized anxiety disorder: Secondary | ICD-10-CM

## 2024-02-07 DIAGNOSIS — F3341 Major depressive disorder, recurrent, in partial remission: Secondary | ICD-10-CM

## 2024-02-07 NOTE — Progress Notes (Signed)
 THERAPIST PROGRESS NOTE  Virtual Visit via Video Note  I connected with Elizabeth Martin on 02/07/24 at  2:00 PM EST by a video enabled telemedicine application and verified that I am speaking with the correct person using two identifiers.  Location: Patient: Address on file  Provider: [facility]   I discussed the limitations of evaluation and management by telemedicine and the availability of in person appointments. The patient expressed understanding and agreed to proceed.   I discussed the assessment and treatment plan with the patient. The patient was provided an opportunity to ask questions and all were answered. The patient agreed with the plan and demonstrated an understanding of the instructions.   The patient was advised to call back or seek an in-person evaluation if the symptoms worsen or if the condition fails to improve as anticipated.  I provided 47 minutes of non-face-to-face time during this encounter.   Dereck Leep, LCSW   Session Time: 2-2:47pm  Participation Level: Active  Behavioral Response: CasualAlertAnxious  Type of Therapy: Individual Therapy  Treatment Goals addressed:  Goal: LTG: Elimination of maladaptive behaviors and thinking patterns which interfere with resolution of trauma as evidenced by self report     Dates: Start:  11/25/23    Expected End:  04/24/24       Disciplines: Interdisciplinary, PROVIDER                    Goal: LTG: Develop and implement effective coping skills to carry out normal responsibilities and participate constructively in relationships as evidenced by self report     Dates: Start:  11/25/23    Expected End:  04/24/24       Disciplines: Interdisciplinary, PROVIDER                 Goal: LTG: Recall traumatic events without becoming overwhelmed with negative emotions     Dates: Start:  11/25/23    Expected End:  04/24/24       Disciplines: Interdisciplinary, PROVIDER                 Goal: LTG: Pt reports  "think about these things in the past without having such an impact on my life."          ProgressTowards Goals: Progressing  Interventions: Supportive and Other: EMDR  Summary: Elizabeth Martin is a 63 y.o. female who presents with symptoms of depression and trauma. Patient identifies symptoms to include reexperiencing, uncontrollable worry, and hypervigilance. Pt was oriented times 5. Pt was cooperative and engaged. Pt denies SI/HI/AVH.   Patient utilized therapeutic space to process recent anxious symptoms stating she is experiencing "a lot of anxiety."  Patient cites trigger for anxiety to include short bursts of flashbacks at night just as she is trying to fall asleep related to previous sessions of EMDR.  Patient identified new memory causing a great deal of distress and supporting to the negative cognition "I am a bad guy."  Clinician utilized session to engage patient in EMDR to process this memory decreasing patient's SUD from 9 to 0 and increasing her VOC score to 7 instilling the belief "I am a good person."  Debriefed from session processing grace given to her parents for not understanding the true meaning of unconditional love and the way in which she was able to redefine parenting styles with her kids.  Also processed the impact of her parents parenting style on her siblings, there trauma history's, and their relationships into adulthood.  Suicidal/Homicidal: Nowithout intent/plan  Therapist Response: Clinician used active and supportive reflection to create a safe environment for patient to process recent life stressors.  Clinician assessed for current symptoms, stressors, safety since last session.  Clinician continued EMDR through processing memories on her target sequence plan.   Plan: Return again in 1 week.  Diagnosis: PTSD (post-traumatic stress disorder)  Recurrent major depressive disorder, in partial remission (HCC)  GAD (generalized anxiety disorder)   Collaboration  of Care: [facility] psychiatrist can access notes and cln. Will review psychiatrists' notes. Check in with the patient and will see LCSW per availability. Patient agreed with treatment recommendations.   Patient/Guardian was advised Release of Information must be obtained prior to any record release in order to collaborate their care with an outside provider. Patient/Guardian was advised if they have not already done so to contact the registration department to sign all necessary forms in order for [place] to release information regarding their care.   Consent: Patient/Guardian gives verbal consent for treatment and assignment of benefits for services provided during this visit. Patient/Guardian expressed understanding and agreed to proceed.   Dereck Leep, LCSW 02/07/2024

## 2024-02-08 ENCOUNTER — Ambulatory Visit: Admitting: Occupational Therapy

## 2024-02-08 ENCOUNTER — Ambulatory Visit: Admitting: Physical Therapy

## 2024-02-08 DIAGNOSIS — M25551 Pain in right hip: Secondary | ICD-10-CM

## 2024-02-08 DIAGNOSIS — I890 Lymphedema, not elsewhere classified: Secondary | ICD-10-CM | POA: Diagnosis not present

## 2024-02-08 DIAGNOSIS — M25651 Stiffness of right hip, not elsewhere classified: Secondary | ICD-10-CM

## 2024-02-08 DIAGNOSIS — R262 Difficulty in walking, not elsewhere classified: Secondary | ICD-10-CM

## 2024-02-08 NOTE — Therapy (Signed)
 OUTPATIENT OCCUPATIONAL THERAPY TREATMENT NOTE   LOWER EXTREMITY LYMPHEDEMA  Patient Name: Elizabeth Martin MRN: [id number] DOB:05-Jul-1961, 63 y.o., female Today's Date: 02/08/2024   END OF SESSION:  Lymphedema Episode 2   OT End of Session - 02/08/24 1050     Visit Number 18    Number of Visits 36    Date for OT Re-Evaluation 04/16/24    OT Start Time 0900    OT Stop Time 1006    OT Time Calculation (min) 66 min    Activity Tolerance Patient tolerated treatment well;No increased pain    Behavior During Therapy WFL for tasks assessed/performed              Past Medical History:  Diagnosis Date   Anxiety    Asthma    Autoimmune disease (HCC)    Cancer (HCC)    Endometrial lymph node removal (30)   Corneal abrasion, right 05/24/2022   Depression    GERD (gastroesophageal reflux disease)    H/O blood clots    upper rt groin and behind both knees   History of hiatal hernia    Hypertension    Lymphedema    right leg   Morphea scleroderma    PONV (postoperative nausea and vomiting)    states gets violently ill   Past Surgical History:  Procedure Laterality Date   BASAL CELL CARCINOMA EXCISION Left    CHOLECYSTECTOMY  2008   FLEXIBLE SIGMOIDOSCOPY N/A 05/19/2022   Procedure: FLEXIBLE SIGMOIDOSCOPY;  Surgeon: Toledo, Boykin Nearing, MD;  Location: [facility];  Service: Gastroenterology;  Laterality: N/A;   HERNIA REPAIR  2004   ILEOSTOMY CLOSURE N/A 08/24/2022   Procedure: ILEOSTOMY TAKEDOWN, open loop with Lynden Oxford, PA-C to assist;  Surgeon: Henrene Dodge, MD;  Location: [facility];  Service: General;  Laterality: N/A;   IVC FILTER INSERTION N/A 11/09/2023   Procedure: IVC FILTER INSERTION;  Surgeon: Annice Needy, MD;  Location: [facility];  Service: Cardiovascular;  Laterality: N/A;   IVC FILTER REMOVAL N/A 01/02/2024   Procedure: IVC FILTER REMOVAL;  Surgeon: Annice Needy, MD;  Location: [facility];  Service: Cardiovascular;  Laterality:  N/A;   PLANTAR FASCIECTOMY     ROBOTIC ASSISTED TOTAL HYSTERECTOMY WITH BILATERAL SALPINGO OOPHERECTOMY  02/14/2012   ROTATOR CUFF REPAIR Left 03/24/2022   TONSILLECTOMY     TOTAL HIP ARTHROPLASTY Right 11/14/2023   Procedure: TOTAL HIP ARTHROPLASTY - posterior;  Surgeon: Reinaldo Berber, MD;  Location: [facility];  Service: Orthopedics;  Laterality: Right;   XI ROBOTIC ASSISTED LOWER ANTERIOR RESECTION N/A 05/24/2022   Procedure: XI ROBOTIC ASSISTED LOWER ANTERIOR RESECTION;  Surgeon: Henrene Dodge, MD;  Location: [facility];  Service: General;  Laterality: N/A;   Patient Active Problem List   Diagnosis Date Noted   Constipation 01/10/2024   Nausea & vomiting 01/10/2024   S/P total right hip arthroplasty 11/14/2023   Blood coagulation disorder (HCC) 11/10/2023   PTSD (post-traumatic stress disorder) 11/10/2023   Severe episode of recurrent major depressive disorder, without psychotic features (HCC) 11/10/2023   GAD (generalized anxiety disorder) 11/10/2023   High risk medication use 11/10/2023   Anxiety and depression 03/22/2023   Cervical radiculopathy 03/16/2023   Colon cancer screening 03/15/2023   Chronic idiopathic constipation 03/15/2023   Dyspnea 03/07/2023   [facility] spotted fever 03/07/2023   Strain of gastrocnemius tendon 03/07/2023   Lower abdominal pain 03/07/2023   Pain in pelvis 03/07/2023   Small bowel obstruction (HCC)  02/11/2023   Raynaud disease 02/11/2023   Polyarthralgia 02/11/2023   Edema 01/03/2023   Diverticular disease 12/10/2022   Hot flashes 12/10/2022   Pain of right lower extremity 12/10/2022   Paresthesia of lower extremity 12/10/2022   Primary insomnia 12/10/2022   Thyroid nodule 12/10/2022   Multiple joint pain 12/10/2022   Basal cell carcinoma of skin 12/10/2022   Acute bilateral low back pain without sciatica 11/10/2022   Chronic lower back pain 09/05/2022   S/P closure of ileostomy 08/24/2022   Ileostomy in place [facility])    Morphea  07/13/2022   Colonic stricture (HCC) 05/19/2022   Large bowel obstruction (HCC) 05/19/2022   Hypokalemia 05/18/2022   Abdominal pain 05/18/2022   Osteoarthritis of left knee 02/24/2022   Mixed connective tissue disease (HCC) 02/17/2022   Adhesive capsulitis of left shoulder 01/27/2022   Glenohumeral arthritis, left 01/27/2022   Microscopic hematuria 01/12/2022   Otorrhagia of right ear 12/10/2021   Elevated testosterone level in female 11/04/2021   Anti-TPO antibodies present 10/20/2021   Rash 10/13/2021   Hashimoto thyroiditis, 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   Hashimoto's disease 03/23/2021   Exposure to severe acute respiratory syndrome coronavirus 2 (SARS-CoV-2) 12/19/2020   Migraine without aura and without status migrainosus, not intractable 08/19/2020   Myofascial pain 07/30/2020   Neck pain 07/30/2020   Upper back pain 07/30/2020   Cervical facet joint syndrome 07/30/2020   Hair loss 07/14/2020   Edema of lower extremity 07/11/2020   Adenomatous colon polyp 02/26/2020   Gastroesophageal reflux disease 02/15/2020   Diarrhea 02/15/2020   Sleep disorder 01/16/2020   Hyperlipidemia 07/11/2019   Right shoulder pain 04/04/2019   Pre-operative clearance 12/13/2018   Endometrial cancer (HCC) 04/05/2018   Patellofemoral pain syndrome of right knee 03/20/2018   Gastroenteritis 12/07/2017   Fatigue 11/16/2017   Morbid obesity (HCC) 08/11/2017   Stucco keratoses 08/11/2017   Essential hypertension 08/09/2017   History of endometrial cancer 08/09/2017   Lymphedema 08/09/2017   Menopause 08/09/2017   Malignant neoplastic disease (HCC) 04/07/2017   Cellulitis 09/18/2016   Benign hypertension 06/02/2016   Asthma 06/04/1969    PCP: Pennie Banter, MD  REFERRING PROVIDER: Pennie Banter, MD  REFERRING DIAG: I89.0   THERAPY DIAG:  Lymphedema, not elsewhere classified  Rationale for Evaluation and Treatment: Rehabilitation  ONSET DATE: 2013  (Cancer-related, endometrial  2013)  SUBJECTIVE:                                                                                                                                                                                           SUBJECTIVE STATEMENT:Patient presents to s/p R hip arthroplasty 11/14/23.  Pt presents to OT for cancer-related lymphedema care to RLE/RLQ after exacerbation following THA. Pt is very motivated to improve lymphedema prior to her upcoming , long awaited trip to [place] with her spouse. Pt reports she is in increased pain this morning w increased soreness in the R hip, tightness in the inguinal region and lower extremity, and new onset neck pain. Pt does not rate pain numerically.  PERTINENT HISTORY: Asthma, Autoimmune Disease, Endometrial Ca w/ LND ( 30 bilateral pelvic and periaortic LN), adjuvant chemotherapy and XRT,   H/O blood clots, RLE/RLQ lymphedema, Robotic assisted total hysterectomy w/ bilateral oophorectomy L Rotator Cuff repair, S/P ileostomy 2/2 colon stricture 05/2022 R hip arthroplasty scheduled 11/11/23.   PAIN:  Are you having pain? YES. See subjective; not rated /10 Pain location: RLE, R hip, cervical spine Pain description: sore, aching, heavy, full, tight Aggravating factors: standing, walking, extended dependent sitting Relieving factors: elevation, movement  PRECAUTIONS: Fall and Other: LYMPHEDEMA  WEIGHT BEARING RESTRICTIONS: Yes 5# lifting restriction- shoulder  FALLS:  Has patient fallen in last 6 months? No  LIVING ENVIRONMENT: Lives with: lives with their spouse Lives in: House/apartment Stairs: No;  Has following equipment at home: Single point cane, Environmental consultant - 2 wheeled, Environmental consultant - 4 wheeled, and Wheelchair (manual)  OCCUPATION: retired Control and instrumentation engineer: gardening, hiking, biking, dancing- unable to participate in any of these due to pain and swelling  HAND DOMINANCE: right   PRIOR LEVEL OF FUNCTION: Independent with basic ADLs, Independent with household  mobility without device, Independent with community mobility without device, Requires assistive device for independence, Needs assistance with homemaking, Needs assistance with transfers, and Leisure: decreased  social participation for leisure pursuits due to impaired mobility and pain  PATIENT GOALS:  Be able to move freely without pain Reduce limb volume to be able to lift limb for basic daily ADLs and functional ambulation Reduce limb volume to increase ability to perform lower body dressing and bathing and grooming Reduce limb to increase body image  OBJECTIVE: Note: Objective measures were completed at Evaluation unless otherwise noted.  COGNITION:  Overall cognitive status: Within functional limits for tasks assessed   OBSERVATIONS / OTHER ASSESSMENTS:   POSTURE: WFL  LE ROM: WFL, but Limited mildly at R knee and ankle due to girth, skin approximation, and joint pain  LE MMT: WFL  Mild, Stage  II, Bilateral Lower Extremity Lymphedema 2/2 CVI and Obesity  Skin  Description Hyper-Keratosis Peau d' Orange Shiny Tight Fibrotic/ Indurated Fatty Doughy Spongy/ boggy   x x x x R>L   x   Skin dry Flaky WNL Macerated   mildly sclerotic     Color Redness Varicosities Blanching Hemosiderin Stain Mottled    x x   x   Odor Malodorous Yeast Fungal infection  WNL      x   Temperature Warm Cool wnl    x     Pitting Edema   1+ 2+ 3+ 4+ Non-pitting         x   Girth Symmetrical Asymmetrical                   Distribution    R>L RLE toes to groin    Stemmer Sign Positive Negative   +    Lymphorrhea History Of:  Present Absent     x    Wounds History Of Present Absent Venous Arterial Pressure Sheer     x        Signs of Infection  Redness Warmth Erythema Acute Swelling Drainage Borders                    Sensation Light Touch Deep pressure Hypersensitivity   In tact Impaired In tact Impaired Absent Impaired   x  x  x     Nails WNL   Fungus nail dystrophy    x     Hair Growth Symmetrical Asymmetrical    R>L   Skin Creases Base of toes  Ankles   Base of Fingers knees       Abdominal pannus Thigh Lobules  Face/neck   x x  x        BLE COMPARATIVE LIMB VOLUMETRICS 10/10/23  LANDMARK RIGHT  10/10/23  R LEG (A-D) 5466.5 ml  R THIGH (E-G) 9186.6 ml  R FULL LIMB (A-G) 14653.1 ml  Limb Volume differential (LVD)  LVD for LEG = 44.1%, R>L LVD for THIGH= 33.98%, R>L LVD for FULL lower extremity 37.8%, R>L  Volume change since last 08/11/22 R LEG is INCREASED in volume by 59.6%. L THIGH is INCREASED by 38 %, and RLE full limb is INCREASED by 45.38%.  Volume change overall V  (Blank rows = not tested)  LANDMARK LEFT  10/12/23  L LEG (A-D) 3053.6 ml  L THIGH (E-G) 6064.8 ml  L  FULL LIMB (A-G) 9118.4 ml  Limb Volume differential (LVD)  %  Volume change since initial %  Volume change overall %  (Blank rows = not tested)   10 th VISIT PROGRESS NOTE RLE COMPARATIVE LIMB VOLUMETRICS 01/04/24: Deferred until next visit by Pt request.  TBA next session  LANDMARK RIGHT    R LEG (A-D) TBA  R THIGH (E-G) TBA  R FULL LIMB (A-G) TBA  Limb Volume differential (LVD)    Volume change since last 08/11/22 TBA  Volume change overall TBA  (Blank rows = not tested)    CA-related LYMPHEDEMA Hx:  SURGERY TYPE/DATE: 2013 NUMBER OF LYMPH NODES REMOVED: 30 by report- bilateral pelvic and periaortic CHEMOTHERAPY: yes RADIATION:yes INFECTIONS: no hx cellulitis GAIT: Distance walked: >500 ft Assistive device utilized: single point cane Level of assistance: Modified independence- extra time Comments: R limp  LYMPHEDEMA LIFE IMPACT SCALE (LLIS): Intake 10/12/23 75% (The extent to which lymphedema related problems impacted your life over the past week)  FOTO (functional outcome measure):10/10/23 INTAKE: 36% No Out take score. FOTO assessment discontinued by clinic.  PATIENT EDUCATION:  Education details: Continued Pt/ CG edu for lymphedema self care home  program throughout session. Topics include outcome of comparative limb volumetrics- starting limb volume differentials (LVDs), technology and gradient techniques used for short stretch, multilayer compression wrapping, simple self-MLD, therapeutic lymphatic pumping exercises, skin/nail care, LE precautions,. compression garment recommendations and specifications, wear and care schedule and compression garment donning / doffing w assistive devices. Discussed progress towards all OT goals since commencing CDT. All questions answered to the Pt's satisfaction. Good return. Person educated: Patient Education method: Explanation, Demonstration, and Handouts Education comprehension: verbalized understanding, returned demonstration, and needs further education  LE SELF-CARE HOME  PROGRAM: Simple self-MLD/daily to affected quadrant and body part At least 2 x daily BLE Lymphatic Pumping There ex 1 set of 10 reps each, in order, bilaterally Daily skin care to affected body part to limit infection risk and increase skin excursion Compression Bandaging Intensive stage compression: multilayer short stretch wraps with gradient techniques. One limb at a time. Length patient dependent. Self-management Phase: Appropriate daytime compression garment and hours-of-sleep device  Compression garments: Custom-made gradient compression garments and hours-of-sleep devices are medically necessary because they are uniquely sized and shaped to fit the exact dimensions of the affected extremities and to provide accurate and consistent gradient compression and containment, essential  for optimally managing chronic, progressive lymphedema. The convoluted HOS devices are medically necessary to facilitate increased lymphatic circulation and limit fibrosis formation when sleeping. Multiple custom compression garments are needed for optimal hygiene to limit infection risk. Custom compression garments should be replaced q 3-6 months When  worn consistently for optimal lipo-lymphedema self-management over time.  Pt will benefit from [id number]- Circaid reduction kit whole leg to increase independence with lymphedema self care, to  reduce limb volume  for body symmetry and balance, and to limit infection risk and progression.  ASSESSMENT:  CLINICAL IMPRESSION: Pt educated about Cica-Care silicone gel sheet for softening morphea scar tissue in groin. The gel sheets are known to hydrate and flatten keloid scars. Although I was unable to find research evidence that this material has been specifically trialed on morphia related to connective tissue disease, Pt agrees that it would be worth exploring further. Pt tolerated MLD to RLE/RLQ without increased pain. We also used deep fibrosis techniques to distal leg and medial malleolus with palpable softening in tissue density after session.Applied multilayer compression wraps as established using gradient techniques.  Cont as per POC.    10/10/23 Lymphedema Episode 2, Initial OT Evaluation : Elizabeth Martin is a 74 y a female presenting with chronic, progressive, moderate, Stage II, RLE/RLQ cancer -related lymphedema with onset 11 years ago after Rx for endometrial cancer. Pt is well known to this therapist as she has successfully undergone Complete Decongestive Therapy (CDT)  this clinic bin the past. Pt reports recent exacerbation of RLE swelling worsened as inflammation in L hip has worsened over time.  Pt has a hip replacement scheduled in late December here [facility]. RE limb volumetrics today reveal that R LEG volume is dramatically increased by 59.65 since last visit. R LEG VOLUME is INCREASED in volume by 59.6%. L THIGH is INCREASED by 38 %, and RLE full limb is INCREASED by 45.38%. Pt presents with LE-related skin changes. Prolonged inflammation in the R hip joint has likely overloaded  already RLE/RLQ lymphatics.  RLE/RLQ lymphedema limits Pt's ability to perform basic and instrumental ADLs,  including functional ambulation, mobility and transfers, grooming , lower body bathing and dressing, skin inspection and skin care. Pt has difficulty  reaching her lower legs and feet to apply compression wraps and don/doff        compression stockings. LE limits ability to P[perform instrumental ADLs, including driving, yard work and home management activities. Lymphedema limits her ability to participate in leisure pursuits and productive activities, and it negatively impacts body image, life roles and quality of life.   Pt will benefit from an Intensive and follow along course of lymphedema. CDT will consist of Manual Lymphatic gainage   (MLD), skin care, therapeutic exercise and compression, wraps initially then garments. Pt will need assistance throughout CDT   for applying compression wraps due to limited hip AROM and decreased skin flexibility. Without skilled Occupational Therapy for lymphedema care, lymphedema will progress and further functional decline is expected.   In prep for upcoming hip replacement OT will educate Pt re assistive devices, including tub transfer bench and elevated toilet seat, in keeping with hip precautions.  OBJECTIVE IMPAIRMENTS: Abnormal gait, decreased activity tolerance, decreased balance, decreased knowledge of use of DME, decreased mobility, difficulty  walking, decreased ROM, decreased strength, increased edema, decreased skin flexibility, increased fascial restrictions, impaired sensation, pain, and chronic, progressive LLE/LLQ lymphatic swelling and associated pain.   ADL LIMITATIONS: carrying, lifting, bending, sitting, standing, squatting, sleeping, stairs, transfers, bed mobility, bathing, dressing, hygiene/grooming, and productive activities, leisure pursuits, social participation, body image  driving, shopping, housework, yard work, cooking, meal prep  PERSONAL FACTORS: Past/current experiences and 3+ comorbidities: Morphea Scleroderma, Mixed connective tissue  disease, and osteoarthritis  are also affecting patient's functional outcome.   REHAB POTENTIAL: Good  EVALUATION COMPLEXITY: Moderate  GOALS: Goals reviewed with patient? Yes  SHORT TERM GOALS: Target date: 4th OT Rx visit   Pt will demonstrate understanding of lymphedema precautions and prevention strategies with modified independence using a printed reference to identify at least 5 precautions and discussing how s/he may implement them into daily life to reduce risk of progression with extra time.  Baseline:Max A Goal status: GOAL MET  2.  Pt will be able to apply multilayer, knee length, gradient, compression wraps to one leg at a time with modified assistance (extra time and assistive device/s) to decrease limb volume, to limit infection risk, and to limit lymphedema progression.  Baseline: Dependent Goal status: GOAL MET  LONG TERM GOALS: Target date: 01/09/24 (12 weeks)  Given this patient's Intake score 36% on the functional outcomes FOTO tool, patient will experience an increase in function of 3 points to improve basic and instrumental ADLs performance, including lymphedema self-care.  Baseline: Max A Goal status: DEFERRED as Janyth Contes has been discontinued  2.  Given this patient's Intake score of  75% on the Lymphedema Life Impact Scale (LLIS), patient will experience a reduction of at least 5 points in her perceived level of functional impairment resulting from lymphedema to improve functional performance and quality of life (QOL). Baseline: % Goal status: PROGRESSING  3.  Pt will achieve at least a 10% volume reduction in full, RLE limb volume to return limb to typical size and shape, to limit infection risk and LE progression, to decrease pain, to improve function. Baseline: Dependent Goal status:PROGRESSING. Volumetrics deferred today by Pt request in order to increase time for manual therapy which provides pain relief. Volumetrics next visit. By visual assessment I would  estimate ~10% volume reduction for entire RLE to DATE. tissue DENSITY IN THIGH HAS REDUCED MILDLY AND SLIGHTLY IN LEG.   4.  Pt will obtain proper compression garments/devices and achieve modified independence (extra time + assistive devices) with donning/doffing to optimize limb volume reductions and limit LE  progression over time. Baseline:  Goal status: 01/04/24: PARTIALLY MET; In an effort to reduce burden of care on her spouse, Pt obtained adjustable, Velcro style R full leg, Circaid leg reduction kit for use s/p THA, but this proved not effective for controlling swelling, and were not easier to don and doff more independently. We've resumed wrapping until she is able to fit back into custom compression garments.  5.  During Intensive phase CDT , with modified independence, Pt will achieve at least 85% compliance with all lymphedema self-care home program components, including daily skin care, compression wraps and /or garments, simple self MLD and lymphatic pumping therex to habituate LE self care protocol  into ADLs for optimal LE self-management over time. Baseline: Dependent Goal status: ONGOING PLAN:  OT FREQUENCY: 2x/week  OT DURATION: 12 weeks and PRN  PLANNED INTERVENTIONS: Complete Decongestive Therapy (Intensive and supported Self-Management Phases), 97110-Therapeutic exercises, 97530- Therapeutic activity, 97535- Self Care, 82956- Manual therapy, Patient/Family  education, Taping, Manual lymph drainage, Scar mobilization, Compression bandaging, DME instructions, and skin care to reduce infection risk throughout manual therapy. Fit with replacement compression garments ASAP  PLAN FOR NEXT SESSION:  Complete RLE comparative limb volumetrics for progress note update Cont RLE/RLQ MLD Cont multilayer compression wrapping    Loel Dubonnet, MS, OTR/L, CLT-LANA 02/08/24 10:53 AM  02/08/2024, 10:53 AM

## 2024-02-08 NOTE — Therapy (Signed)
 OUTPATIENT PHYSICAL THERAPY TREATMENT   Patient Name: Elizabeth Martin MRN: [id number] DOB:03/06/1961, 63 y.o., female Today's Date: 02/08/2024  END OF SESSION:  PT End of Session - 02/08/24 0802     Visit Number 8    Number of Visits 16    Date for PT Re-Evaluation 03/01/24    Authorization Type [employer]    Progress Note Due on Visit 10    PT Start Time 0803    PT Stop Time 0845    PT Time Calculation (min) 42 min    Equipment Utilized During Treatment Gait belt    Activity Tolerance Patient tolerated treatment well;No increased pain    Behavior During Therapy WFL for tasks assessed/performed                 Past Medical History:  Diagnosis Date   Anxiety    Asthma    Autoimmune disease (HCC)    Cancer (HCC)    Endometrial lymph node removal (30)   Corneal abrasion, right 05/24/2022   Depression    GERD (gastroesophageal reflux disease)    H/O blood clots    upper rt groin and behind both knees   History of hiatal hernia    Hypertension    Lymphedema    right leg   Morphea scleroderma    PONV (postoperative nausea and vomiting)    states gets violently ill   Past Surgical History:  Procedure Laterality Date   BASAL CELL CARCINOMA EXCISION Left    CHOLECYSTECTOMY  2008   FLEXIBLE SIGMOIDOSCOPY N/A 05/19/2022   Procedure: FLEXIBLE SIGMOIDOSCOPY;  Surgeon: Toledo, Boykin Nearing, MD;  Location: [facility];  Service: Gastroenterology;  Laterality: N/A;   HERNIA REPAIR  2004   ILEOSTOMY CLOSURE N/A 08/24/2022   Procedure: ILEOSTOMY TAKEDOWN, open loop with Lynden Oxford, PA-C to assist;  Surgeon: Henrene Dodge, MD;  Location: [facility] OR[facility];  Service: General;  Laterality: N/A;   IVC FILTER INSERTION N/A 11/09/2023   Procedure: IVC FILTER INSERTION;  Surgeon: Annice Needy, MD;  Location: [facility];  Service: Cardiovascular;  Laterality: N/A;   IVC FILTER REMOVAL N/A 01/02/2024   Procedure: IVC FILTER REMOVAL;  Surgeon: Annice Needy, MD;  Location: [facility]  INVASIVE CV [facility];  Service: Cardiovascular;  Laterality: N/A;   PLANTAR FASCIECTOMY     ROBOTIC ASSISTED TOTAL HYSTERECTOMY WITH BILATERAL SALPINGO OOPHERECTOMY  02/14/2012   ROTATOR CUFF REPAIR Left 03/24/2022   TONSILLECTOMY     TOTAL HIP ARTHROPLASTY Right 11/14/2023   Procedure: TOTAL HIP ARTHROPLASTY - posterior;  Surgeon: Reinaldo Berber, MD;  Location: [facility] OR[facility];  Service: Orthopedics;  Laterality: Right;   XI ROBOTIC ASSISTED LOWER ANTERIOR RESECTION N/A 05/24/2022   Procedure: XI ROBOTIC ASSISTED LOWER ANTERIOR RESECTION;  Surgeon: Henrene Dodge, MD;  Location: [facility] OR[facility];  Service: General;  Laterality: N/A;   Patient Active Problem List   Diagnosis Date Noted   Constipation 01/10/2024   Nausea & vomiting 01/10/2024   S/P total right hip arthroplasty 11/14/2023   Blood coagulation disorder (HCC) 11/10/2023   PTSD (post-traumatic stress disorder) 11/10/2023   Severe episode of recurrent major depressive disorder, without psychotic features (HCC) 11/10/2023   GAD (generalized anxiety disorder) 11/10/2023   High risk medication use 11/10/2023   Anxiety and depression 03/22/2023   Cervical radiculopathy 03/16/2023   Colon cancer screening 03/15/2023   Chronic idiopathic constipation 03/15/2023   Dyspnea 03/07/2023   [place] spotted fever 03/07/2023   Strain of gastrocnemius tendon 03/07/2023  Lower abdominal pain 03/07/2023   Pain in pelvis 03/07/2023   Small bowel obstruction (HCC) 02/11/2023   Raynaud disease 02/11/2023   Polyarthralgia 02/11/2023   Edema 01/03/2023   Diverticular disease 12/10/2022   Hot flashes 12/10/2022   Pain of right lower extremity 12/10/2022   Paresthesia of lower extremity 12/10/2022   Primary insomnia 12/10/2022   Thyroid nodule 12/10/2022   Multiple joint pain 12/10/2022   Basal cell carcinoma of skin 12/10/2022   Acute bilateral low back pain without sciatica 11/10/2022   Chronic lower back pain 09/05/2022   S/P closure of ileostomy  08/24/2022   Ileostomy in place [facility])    Morphea 07/13/2022   Colonic stricture (HCC) 05/19/2022   Large bowel obstruction (HCC) 05/19/2022   Hypokalemia 05/18/2022   Abdominal pain 05/18/2022   Osteoarthritis of left knee 02/24/2022   Mixed connective tissue disease (HCC) 02/17/2022   Adhesive capsulitis of left shoulder 01/27/2022   Glenohumeral arthritis, left 01/27/2022   Microscopic hematuria 01/12/2022   Otorrhagia of right ear 12/10/2021   Elevated testosterone level in female 11/04/2021   Anti-TPO antibodies present 10/20/2021   Rash 10/13/2021   Hashimoto thyroiditis, 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   Hashimoto's disease 03/23/2021   Exposure to severe acute respiratory syndrome coronavirus 2 (SARS-CoV-2) 12/19/2020   Migraine without aura and without status migrainosus, not intractable 08/19/2020   Myofascial pain 07/30/2020   Neck pain 07/30/2020   Upper back pain 07/30/2020   Cervical facet joint syndrome 07/30/2020   Hair loss 07/14/2020   Edema of lower extremity 07/11/2020   Adenomatous colon polyp 02/26/2020   Gastroesophageal reflux disease 02/15/2020   Diarrhea 02/15/2020   Sleep disorder 01/16/2020   Hyperlipidemia 07/11/2019   Right shoulder pain 04/04/2019   Pre-operative clearance 12/13/2018   Endometrial cancer (HCC) 04/05/2018   Patellofemoral pain syndrome of right knee 03/20/2018   Gastroenteritis 12/07/2017   Fatigue 11/16/2017   Morbid obesity (HCC) 08/11/2017   Stucco keratoses 08/11/2017   Essential hypertension 08/09/2017   History of endometrial cancer 08/09/2017   Lymphedema 08/09/2017   Menopause 08/09/2017   Malignant neoplastic disease (HCC) 04/07/2017   Cellulitis 09/18/2016   Benign hypertension 06/02/2016   Asthma 06/04/1969    PCP: Pennie Banter MD   REFERRING PROVIDER: Pennie Banter MD   REFERRING DIAG: s/p R hip replacement  THERAPY DIAG:  Stiffness of right hip, not elsewhere classified  Pain in right hip  Difficulty  in walking, not elsewhere classified  Rationale for Evaluation and Treatment: Rehabilitation  ONSET DATE: 11/14/23  SUBJECTIVE:   SUBJECTIVE STATEMENT: Patient reports her hip pain worsened over the weekend. Feels sore after PT.   Will be leaving for [place] on 3/19 for a 10 day trip.     PERTINENT HISTORY: s/p R hip arthropaslasty 11/14/23 with Dr. Audelia Acton posterior approach, blood coagulation disorder, PTSD, anxiety, cervical radiculopathy dypsnea, polyarthralgia, Raynaud disease, s/p closure of ileostomy, Hashimoto thyroid, endometrial cancer, menopause, endometrial cancer, lymphedema. Patient had home health PT prior to PT here.   PAIN:  Are you having pain?  4/10 Rt hip joint pain, posterior portion of hip.    PRECAUTIONS: Other: recovering from hip sx  RED FLAGS: None   WEIGHT BEARING RESTRICTIONS: No  FALLS:  Has patient fallen in last 6 months? Yes. Number of falls 2  LIVING ENVIRONMENT: Lives with: lives with their spouse Lives in: House/apartment Stairs: No Has following equipment at home: Single point cane  OCCUPATION: retired,   PLOF: Independent  PATIENT GOALS: return  to hiking, working out, gardening.   NEXT MD VISIT: March 2025   OBJECTIVE:  Note: Objective measures were completed at Evaluation unless otherwise noted.  DIAGNOSTIC FINDINGS:   IMPRESSION: Right hip replacement.  No visible complicating feature.  PATIENT SURVEYS:  LEFS 5/80  COGNITION: Overall cognitive status: Within functional limits for tasks assessed    MUSCLE LENGTH: Hamstrings: limited bilaterally with R>L   LOWER EXTREMITY ROM:  Passive ROM Right eval Left eval  Hip flexion 48 flexion PROM supine   Hip extension    Hip abduction 5 degrees AROM     (Blank rows = not tested)  LOWER EXTREMITY MMT:  MMT Right eval Left eval  Hip flexion 2+ 5  Hip extension  5  Hip abduction 2+* painful 5  Hip adduction 2+ 5  Hip internal rotation    Hip external rotation     Knee flexion 3 5  Knee extension 3 5  Ankle dorsiflexion 3 5  Ankle plantarflexion 3 5   (Blank rows = not tested)    FUNCTIONAL TESTS:  5 times sit to stand: 29 6 minute walk test:  10 meter walk test: 15 seconds with SPC   GAIT: Distance walked: 60 ft Assistive device utilized: Single point cane Level of assistance: CGA Comments: antalgic gait pattern with decreased stance time RLE. Slight vaulting pattern.  Stair negotiation: -step to pattern lead with LLE, one hand on cane one hand on rail ascending. Descending step to pattern down with RLE.                                                                                                                               TREATMENT DATE: 02/08/24  TA to improve BLE ROM and allow improved independence with ADLs and community mobility.   Lateral foot tap on half bolster x 8 bil  Lateral step over 2 half bolsters x5 bil  Forward step over half bolster x 6 bil  Pt reports mild weakness in the R knee   In // bars High knee march 4x length  Lateral step 4x length of // bars  Hedgehogs: tap between blue and pink cone 10x each side Step over yellow line 10x each LE; focus on weight shift and control of foot placement  Seated: Dynadisc performed bil: -df/pf 15x -eversion/inversion 15x -clockwise/CCW circle 8 x each   Supine SAQ 4# AW SLR AAROM on the RLE x 8, AROM on the LLE x 10  Bridge x 6 with 2 sec hold.  Clam shell on the RLE x 8  Hip abduction x 5 AAROM on the RLE  Heel slide x 10 with pillow case under foot.   Supine R HS stretch 2 x 30 sec. Nerve floss in supine x 5 with 5 sec hold  Seated sciatic Nerve floss x 8 on the RLE Nerve glide x 5 with 5 sec hold on the RLE    PATIENT EDUCATION:  Education  details: goals, POC, HEP Person educated: Patient Education method: Explanation, Demonstration, Tactile cues, and Verbal cues Education comprehension: verbalized understanding, returned demonstration, verbal cues  required, tactile cues required, and needs further education  HOME EXERCISE PROGRAM: Access Code: [facility] URL: [email or web address] Date: 02/08/2024 Prepared by: Grier Rocher  Exercises - Side Stepping with Counter Support  - 1 x daily - 7 x weekly - 2 sets - 10 reps - 5 hold - Standing Hip Extension  - 1 x daily - 7 x weekly - 2 sets - 10 reps - 5 hold - Standing March with Counter Support  - 1 x daily - 7 x weekly - 2 sets - 10 reps - 5 hold - Standing Hip Abduction with Counter Support  - 1 x daily - 7 x weekly - 2 sets - 10 reps - Seated Hamstring Stretch  - 1 x daily - 7 x weekly - 2 sets - 2 reps - 30 hold - Sit to Stand  - 1 x daily - 7 x weekly - 2 sets - 10 reps - 5 hold - Supine Heel Slide  - 1 x daily - 7 x weekly - 2 sets - 6 reps - Supine Gluteal Sets  - 1 x daily - 7 x weekly - 2 sets - 10 reps - Supine Bridge  - 1 x daily - 7 x weekly - 3 sets - 5 reps - 2-3sec hold - Clamshell  - 1 x daily - 7 x weekly - 3 sets - 10 reps - Supine Short Arc Quad  - 1 x daily - 7 x weekly - 3 sets - 15 reps - Small Range Straight Leg Raise  - 1 x daily - 7 x weekly - 3 sets - 5 reps  ASSESSMENT: CLINICAL IMPRESSION:  Patient presents with good motivation. No pain reported on this day, but mild knee instability on the RLE with lateral and forward stepping over bolster, but improved success with SLS on this day. Increased supine HEP for increased strengthening of antigravity muscles. Pt will continue to benefit from skilled physical therapy intervention to address impairments, improve QOL, and attain therapy goals.    OBJECTIVE IMPAIRMENTS: Abnormal gait, decreased activity tolerance, decreased balance, decreased coordination, decreased endurance, decreased mobility, difficulty walking, decreased ROM, decreased strength, hypomobility, increased edema, impaired perceived functional ability, impaired flexibility, improper body mechanics, postural dysfunction, and pain.    ACTIVITY LIMITATIONS: carrying, lifting, bending, sitting, standing, squatting, sleeping, stairs, transfers, bed mobility, bathing, toileting, dressing, reach over head, hygiene/grooming, locomotion level, and caring for others  PARTICIPATION LIMITATIONS: meal prep, cleaning, laundry, interpersonal relationship, driving, shopping, community activity, and yard work  PERSONAL FACTORS: Age, Past/current experiences, Time since onset of injury/illness/exacerbation, and 3+ comorbidities: blood coagulation disorder, PTSD, anxiety, cervical radiculopathy dypsnea, polyarthralgia, Raynaud disease, s/p closure of ileostomy, Hashimoto thyroid, endometrial cancer, menopause, endometrial cancer, lymphedema  are also affecting patient's functional outcome.   REHAB POTENTIAL: Good  CLINICAL DECISION MAKING: Evolving/moderate complexity  EVALUATION COMPLEXITY: Moderate   GOALS: Goals reviewed with patient? Yes  SHORT TERM GOALS: Target date: 01/19/2024  Patient will be independent in home exercise program to improve strength/mobility for better functional independence with ADLs. Baseline:1/30: HEP given Goal status: INITIAL  LONG TERM GOALS: Target date: 03/01/2024  Patient (> 67 years old) will complete five times sit to stand test in < 10 seconds indicating an increased LE strength and improved balance. Baseline: 1/30: 29 seconds Goal status: INITIAL  2.  Patient will increase six minute  walk test distance to >1000 for progression to community ambulator and improve gait ability Baseline: 430 ft in 5 min and required seated rest to end test due to fatigue, uses SPC Goal status: INITIAL  3.  Patient will increase 10 meter walk test to >1.5m/s as to improve gait speed for better community ambulation and to reduce fall risk. Baseline: 1/30: 15 seconds with SPC  Goal status: INITIAL  4.  Patient will increase lower extremity functional scale to >60/80 to demonstrate improved functional mobility  and increased tolerance with ADLs.  Baseline: 1/30: 5/80 Goal status: INITIAL  PLAN:  PT FREQUENCY: 2x/week PT DURATION: 8 weeks PLANNED INTERVENTIONS: 97164- PT Re-evaluation, 97110-Therapeutic exercises, 97530- Therapeutic activity, 97112- Neuromuscular re-education, 97535- Self Care, 84696- Manual therapy, [id number]- Gait training, [phone]- Splinting, 97014- Electrical stimulation (unattended), [phone]- Electrical stimulation (manual), [id number]- Ultrasound, 40102- Traction (mechanical), Patient/Family education, Balance training, Stair training, Taping, Joint mobilization, Joint manipulation, Spinal manipulation, Spinal mobilization, Manual lymph drainage, Scar mobilization, Compression bandaging, Vestibular training, Visual/preceptual remediation/compensation, DME instructions, Cryotherapy, and Moist heat  PLAN FOR NEXT SESSION:   Dynamic gait and  R hip strength and ROM,    8:06 AM, 02/08/24    Golden Pop, PT 02/08/2024, 8:06 AM

## 2024-02-13 ENCOUNTER — Ambulatory Visit

## 2024-02-13 ENCOUNTER — Ambulatory Visit: Admitting: Occupational Therapy

## 2024-02-13 DIAGNOSIS — I890 Lymphedema, not elsewhere classified: Secondary | ICD-10-CM

## 2024-02-13 DIAGNOSIS — M25551 Pain in right hip: Secondary | ICD-10-CM

## 2024-02-13 DIAGNOSIS — M25651 Stiffness of right hip, not elsewhere classified: Secondary | ICD-10-CM

## 2024-02-13 DIAGNOSIS — R262 Difficulty in walking, not elsewhere classified: Secondary | ICD-10-CM

## 2024-02-13 NOTE — Therapy (Signed)
 OUTPATIENT OCCUPATIONAL THERAPY TREATMENT NOTE   LOWER EXTREMITY LYMPHEDEMA  Patient Name: Elizabeth Martin MRN: [id number] DOB:February 05, 1961, 63 y.o., female Today's Date: 02/13/2024   END OF SESSION:  Lymphedema Episode 2   OT End of Session - 02/13/24 0911     Visit Number 19    Number of Visits 36    Date for OT Re-Evaluation 04/16/24    OT Start Time 0900    OT Stop Time 1011    OT Time Calculation (min) 71 min    Activity Tolerance Patient tolerated treatment well;No increased pain    Behavior During Therapy WFL for tasks assessed/performed              Past Medical History:  Diagnosis Date   Anxiety    Asthma    Autoimmune disease (HCC)    Cancer (HCC)    Endometrial lymph node removal (30)   Corneal abrasion, right 05/24/2022   Depression    GERD (gastroesophageal reflux disease)    H/O blood clots    upper rt groin and behind both knees   History of hiatal hernia    Hypertension    Lymphedema    right leg   Morphea scleroderma    PONV (postoperative nausea and vomiting)    states gets violently ill   Past Surgical History:  Procedure Laterality Date   BASAL CELL CARCINOMA EXCISION Left    CHOLECYSTECTOMY  2008   FLEXIBLE SIGMOIDOSCOPY N/A 05/19/2022   Procedure: FLEXIBLE SIGMOIDOSCOPY;  Surgeon: Toledo, Boykin Nearing, MD;  Location: [facility];  Service: Gastroenterology;  Laterality: N/A;   HERNIA REPAIR  2004   ILEOSTOMY CLOSURE N/A 08/24/2022   Procedure: ILEOSTOMY TAKEDOWN, open loop with Lynden Oxford, PA-C to assist;  Surgeon: Henrene Dodge, MD;  Location: [facility];  Service: General;  Laterality: N/A;   IVC FILTER INSERTION N/A 11/09/2023   Procedure: IVC FILTER INSERTION;  Surgeon: Annice Needy, MD;  Location: [facility];  Service: Cardiovascular;  Laterality: N/A;   IVC FILTER REMOVAL N/A 01/02/2024   Procedure: IVC FILTER REMOVAL;  Surgeon: Annice Needy, MD;  Location: [facility];  Service: Cardiovascular;  Laterality:  N/A;   PLANTAR FASCIECTOMY     ROBOTIC ASSISTED TOTAL HYSTERECTOMY WITH BILATERAL SALPINGO OOPHERECTOMY  02/14/2012   ROTATOR CUFF REPAIR Left 03/24/2022   TONSILLECTOMY     TOTAL HIP ARTHROPLASTY Right 11/14/2023   Procedure: TOTAL HIP ARTHROPLASTY - posterior;  Surgeon: Reinaldo Berber, MD;  Location: [facility];  Service: Orthopedics;  Laterality: Right;   XI ROBOTIC ASSISTED LOWER ANTERIOR RESECTION N/A 05/24/2022   Procedure: XI ROBOTIC ASSISTED LOWER ANTERIOR RESECTION;  Surgeon: Henrene Dodge, MD;  Location: [facility];  Service: General;  Laterality: N/A;   Patient Active Problem List   Diagnosis Date Noted   Constipation 01/10/2024   Nausea & vomiting 01/10/2024   S/P total right hip arthroplasty 11/14/2023   Blood coagulation disorder (HCC) 11/10/2023   PTSD (post-traumatic stress disorder) 11/10/2023   Severe episode of recurrent major depressive disorder, without psychotic features (HCC) 11/10/2023   GAD (generalized anxiety disorder) 11/10/2023   High risk medication use 11/10/2023   Anxiety and depression 03/22/2023   Cervical radiculopathy 03/16/2023   Colon cancer screening 03/15/2023   Chronic idiopathic constipation 03/15/2023   Dyspnea 03/07/2023   [facility] spotted fever 03/07/2023   Strain of gastrocnemius tendon 03/07/2023   Lower abdominal pain 03/07/2023   Pain in pelvis 03/07/2023   Small bowel obstruction (HCC)  02/11/2023   Raynaud disease 02/11/2023   Polyarthralgia 02/11/2023   Edema 01/03/2023   Diverticular disease 12/10/2022   Hot flashes 12/10/2022   Pain of right lower extremity 12/10/2022   Paresthesia of lower extremity 12/10/2022   Primary insomnia 12/10/2022   Thyroid nodule 12/10/2022   Multiple joint pain 12/10/2022   Basal cell carcinoma of skin 12/10/2022   Acute bilateral low back pain without sciatica 11/10/2022   Chronic lower back pain 09/05/2022   S/P closure of ileostomy 08/24/2022   Ileostomy in place [facility])    Morphea  07/13/2022   Colonic stricture (HCC) 05/19/2022   Large bowel obstruction (HCC) 05/19/2022   Hypokalemia 05/18/2022   Abdominal pain 05/18/2022   Osteoarthritis of left knee 02/24/2022   Mixed connective tissue disease (HCC) 02/17/2022   Adhesive capsulitis of left shoulder 01/27/2022   Glenohumeral arthritis, left 01/27/2022   Microscopic hematuria 01/12/2022   Otorrhagia of right ear 12/10/2021   Elevated testosterone level in female 11/04/2021   Anti-TPO antibodies present 10/20/2021   Rash 10/13/2021   Hashimoto thyroiditis, 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   Hashimoto's disease 03/23/2021   Exposure to severe acute respiratory syndrome coronavirus 2 (SARS-CoV-2) 12/19/2020   Migraine without aura and without status migrainosus, not intractable 08/19/2020   Myofascial pain 07/30/2020   Neck pain 07/30/2020   Upper back pain 07/30/2020   Cervical facet joint syndrome 07/30/2020   Hair loss 07/14/2020   Edema of lower extremity 07/11/2020   Adenomatous colon polyp 02/26/2020   Gastroesophageal reflux disease 02/15/2020   Diarrhea 02/15/2020   Sleep disorder 01/16/2020   Hyperlipidemia 07/11/2019   Right shoulder pain 04/04/2019   Pre-operative clearance 12/13/2018   Endometrial cancer (HCC) 04/05/2018   Patellofemoral pain syndrome of right knee 03/20/2018   Gastroenteritis 12/07/2017   Fatigue 11/16/2017   Morbid obesity (HCC) 08/11/2017   Stucco keratoses 08/11/2017   Essential hypertension 08/09/2017   History of endometrial cancer 08/09/2017   Lymphedema 08/09/2017   Menopause 08/09/2017   Malignant neoplastic disease (HCC) 04/07/2017   Cellulitis 09/18/2016   Benign hypertension 06/02/2016   Asthma 06/04/1969    PCP: Pennie Banter, MD  REFERRING PROVIDER: Pennie Banter, MD  REFERRING DIAG: I89.0   THERAPY DIAG:  Lymphedema, not elsewhere classified  Rationale for Evaluation and Treatment: Rehabilitation  ONSET DATE: 2013  (Cancer-related, endometrial  2013)  SUBJECTIVE:                                                                                                                                                                                           SUBJECTIVE STATEMENT:Patient presents to s/p R hip arthroplasty 11/14/23.  Pt presents to OT for cancer-related lymphedema care to RLE/RLQ after exacerbation following THA. Pt is very motivated to improve lymphedema prior to her upcoming , long awaited trip to [place] with her spouse. Pt reports she is in increased pain this morning w increased soreness in the R hip, tightness in the inguinal region and lower extremity, and new onset neck pain. Pt does not rate pain numerically.  PERTINENT HISTORY: Asthma, Autoimmune Disease, Endometrial Ca w/ LND ( 30 bilateral pelvic and periaortic LN), adjuvant chemotherapy and XRT,   H/O blood clots, RLE/RLQ lymphedema, Robotic assisted total hysterectomy w/ bilateral oophorectomy L Rotator Cuff repair, S/P ileostomy 2/2 colon stricture 05/2022 R hip arthroplasty scheduled 11/11/23.   PAIN:  Are you having pain? YES. See subjective; not rated /10 Pain location: RLE, R hip, cervical spine Pain description: sore, aching, heavy, full, tight Aggravating factors: standing, walking, extended dependent sitting Relieving factors: elevation, movement  PRECAUTIONS: Fall and Other: LYMPHEDEMA  WEIGHT BEARING RESTRICTIONS: Yes 5# lifting restriction- shoulder  FALLS:  Has patient fallen in last 6 months? No  LIVING ENVIRONMENT: Lives with: lives with their spouse Lives in: House/apartment Stairs: No;  Has following equipment at home: Single point cane, Environmental consultant - 2 wheeled, Environmental consultant - 4 wheeled, and Wheelchair (manual)  OCCUPATION: retired Control and instrumentation engineer: gardening, hiking, biking, dancing- unable to participate in any of these due to pain and swelling  HAND DOMINANCE: right   PRIOR LEVEL OF FUNCTION: Independent with basic ADLs, Independent with household  mobility without device, Independent with community mobility without device, Requires assistive device for independence, Needs assistance with homemaking, Needs assistance with transfers, and Leisure: decreased  social participation for leisure pursuits due to impaired mobility and pain  PATIENT GOALS:  Be able to move freely without pain Reduce limb volume to be able to lift limb for basic daily ADLs and functional ambulation Reduce limb volume to increase ability to perform lower body dressing and bathing and grooming Reduce limb to increase body image  OBJECTIVE: Note: Objective measures were completed at Evaluation unless otherwise noted.  COGNITION:  Overall cognitive status: Within functional limits for tasks assessed   OBSERVATIONS / OTHER ASSESSMENTS:   POSTURE: WFL  LE ROM: WFL, but Limited mildly at R knee and ankle due to girth, skin approximation, and joint pain  LE MMT: WFL  Mild, Stage  II, Bilateral Lower Extremity Lymphedema 2/2 CVI and Obesity  Skin  Description Hyper-Keratosis Peau d' Orange Shiny Tight Fibrotic/ Indurated Fatty Doughy Spongy/ boggy   x x x x R>L   x   Skin dry Flaky WNL Macerated   mildly sclerotic     Color Redness Varicosities Blanching Hemosiderin Stain Mottled    x x   x   Odor Malodorous Yeast Fungal infection  WNL      x   Temperature Warm Cool wnl    x     Pitting Edema   1+ 2+ 3+ 4+ Non-pitting         x   Girth Symmetrical Asymmetrical                   Distribution    R>L RLE toes to groin    Stemmer Sign Positive Negative   +    Lymphorrhea History Of:  Present Absent     x    Wounds History Of Present Absent Venous Arterial Pressure Sheer     x        Signs of Infection  Redness Warmth Erythema Acute Swelling Drainage Borders                    Sensation Light Touch Deep pressure Hypersensitivity   In tact Impaired In tact Impaired Absent Impaired   x  x  x     Nails WNL   Fungus nail dystrophy    x     Hair Growth Symmetrical Asymmetrical    R>L   Skin Creases Base of toes  Ankles   Base of Fingers knees       Abdominal pannus Thigh Lobules  Face/neck   x x  x        BLE COMPARATIVE LIMB VOLUMETRICS 10/10/23  LANDMARK RIGHT  10/10/23  R LEG (A-D) 5466.5 ml  R THIGH (E-G) 9186.6 ml  R FULL LIMB (A-G) 14653.1 ml  Limb Volume differential (LVD)  LVD for LEG = 44.1%, R>L LVD for THIGH= 33.98%, R>L LVD for FULL lower extremity 37.8%, R>L  Volume change since last 08/11/22 R LEG is INCREASED in volume by 59.6%. L THIGH is INCREASED by 38 %, and RLE full limb is INCREASED by 45.38%.  Volume change overall V  (Blank rows = not tested)  LANDMARK LEFT  10/12/23  L LEG (A-D) 3053.6 ml  L THIGH (E-G) 6064.8 ml  L  FULL LIMB (A-G) 9118.4 ml  Limb Volume differential (LVD)  %  Volume change since initial %  Volume change overall %  (Blank rows = not tested)   10 th VISIT PROGRESS NOTE RLE COMPARATIVE LIMB VOLUMETRICS 01/04/24: Deferred until next visit by Pt request.  TBA next session  LANDMARK RIGHT    R LEG (A-D) TBA  R THIGH (E-G) TBA  R FULL LIMB (A-G) TBA  Limb Volume differential (LVD)    Volume change since last 08/11/22 TBA  Volume change overall TBA  (Blank rows = not tested)  20 th VISIT PROGRESS NOTE RLE COMPARATIVE LIMB VOLUMETRICS   LANDMARK RIGHT    R LEG (A-D) %  R THIGH (E-G) %  R FULL LIMB (A-G) %  Limb Volume differential (LVD)    Volume change since last measured on 10/12/23 %  Volume change since commencing OT for CDT %   ENDOMETRIAL CA-related LYMPHEDEMA Hx:  SURGERY TYPE/DATE: 2013 NUMBER OF LYMPH NODES REMOVED: 30 by report- bilateral pelvic and periaortic CHEMOTHERAPY: yes RADIATION:yes INFECTIONS: no hx cellulitis GAIT: Distance walked: >500 ft Assistive device utilized: single point cane Level of assistance: Modified independence- extra time Comments: R limp  LYMPHEDEMA LIFE IMPACT SCALE (LLIS): Intake 10/12/23 75% (The extent to  which lymphedema related problems impacted your life over the past week)  FOTO (functional outcome measure):10/10/23 INTAKE: 36% No Out take score. FOTO assessment discontinued by clinic.  PATIENT EDUCATION:  Education details: Continued Pt/ CG edu for lymphedema self care home program throughout session. Topics include outcome of comparative limb volumetrics- starting limb volume differentials (LVDs), technology and gradient techniques used for short stretch, multilayer compression wrapping, simple self-MLD, therapeutic lymphatic pumping exercises, skin/nail care, LE precautions,. compression garment recommendations and specifications, wear and care schedule and compression garment donning / doffing w assistive devices. Discussed progress towards all OT goals since commencing CDT. All questions answered to the Pt's satisfaction. Good return. Person educated: Patient Education method: Explanation, Demonstration, and Handouts Education comprehension: verbalized understanding, returned demonstration, and needs further education  LE SELF-CARE HOME  PROGRAM: Simple self-MLD/daily to affected quadrant and body part At least 2 x daily BLE  Lymphatic Pumping There ex 1 set of 10 reps each, in order, bilaterally Daily skin care to affected body part to limit infection risk and increase skin excursion Compression Bandaging Intensive stage compression: multilayer short stretch wraps with gradient techniques. One limb at a time. Length patient dependent. Self-management Phase: Appropriate daytime compression garment and hours-of-sleep device  Compression garments: Custom-made gradient compression garments and hours-of-sleep devices are medically necessary because they are uniquely sized and shaped to fit the exact dimensions of the affected extremities and to provide accurate and consistent gradient compression and containment, essential  for optimally managing chronic, progressive lymphedema. The convoluted  HOS devices are medically necessary to facilitate increased lymphatic circulation and limit fibrosis formation when sleeping. Multiple custom compression garments are needed for optimal hygiene to limit infection risk. Custom compression garments should be replaced q 3-6 months When worn consistently for optimal lipo-lymphedema self-management over time.  Pt will benefit from [id number]- Circaid reduction kit whole leg to increase independence with lymphedema self care, to  reduce limb volume  for body symmetry and balance, and to limit infection risk and progression.  ASSESSMENT:  CLINICAL IMPRESSION: Pt tolerated MLD to RLE/RLQ without increased pain. We also used deep fibrosis techniques to distal leg and medial malleolus with palpable softening in tissue density after session.Applied multilayer compression wraps as established using gradient techniques.  Cont as per POC.  10/10/23 Lymphedema Episode 2, Initial OT Evaluation : Elizabeth Martin is a 44 y a female presenting with chronic, progressive, moderate, Stage II, RLE/RLQ cancer -related lymphedema with onset 11 years ago after Rx for endometrial cancer. Pt is well known to this therapist as she has successfully undergone Complete Decongestive Therapy (CDT)  this clinic bin the past. Pt reports recent exacerbation of RLE swelling worsened as inflammation in L hip has worsened over time.  Pt has a hip replacement scheduled in late December here it [facility]. RE limb volumetrics today reveal that R LEG volume is dramatically increased by 59.65 since last visit. R LEG VOLUME is INCREASED in volume by 59.6%. L THIGH is INCREASED by 38 %, and RLE full limb is INCREASED by 45.38%. Pt presents with LE-related skin changes. Prolonged inflammation in the R hip joint has likely overloaded  already RLE/RLQ lymphatics.  RLE/RLQ lymphedema limits Pt's ability to perform basic and instrumental ADLs, including functional ambulation, mobility and transfers, grooming , lower  body bathing and dressing, skin inspection and skin care. Pt has difficulty  reaching her lower legs and feet to apply compression wraps and don/doff        compression stockings. LE limits ability to P[perform instrumental ADLs, including driving, yard work and home management activities. Lymphedema limits her ability to participate in leisure pursuits and productive activities, and it negatively impacts body image, life roles and quality of life.   Pt will benefit from an Intensive and follow along course of lymphedema. CDT will consist of Manual Lymphatic gainage   (MLD), skin care, therapeutic exercise and compression, wraps initially then garments. Pt will need assistance throughout CDT   for applying compression wraps due to limited hip AROM and decreased skin flexibility. Without skilled Occupational Therapy for lymphedema care, lymphedema will progress and further functional decline is expected.   In prep for upcoming hip replacement OT will educate Pt re assistive devices, including tub transfer bench and elevated toilet seat, in keeping with hip precautions.  OBJECTIVE IMPAIRMENTS: Abnormal gait, decreased activity tolerance, decreased balance, decreased knowledge of use of DME, decreased mobility, difficulty  walking, decreased ROM, decreased strength, increased edema, decreased skin flexibility, increased fascial restrictions, impaired sensation, pain, and chronic, progressive LLE/LLQ lymphatic swelling and associated pain.   ADL LIMITATIONS: carrying, lifting, bending, sitting, standing, squatting, sleeping, stairs, transfers, bed mobility, bathing, dressing, hygiene/grooming, and productive activities, leisure pursuits, social participation, body image  driving, shopping, housework, yard work, cooking, meal prep  PERSONAL FACTORS: Past/current experiences and 3+ comorbidities: Morphea Scleroderma, Mixed connective tissue disease, and osteoarthritis  are also affecting patient's functional  outcome.   REHAB POTENTIAL: Good  EVALUATION COMPLEXITY: Moderate  GOALS: Goals reviewed with patient? Yes  SHORT TERM GOALS: Target date: 4th OT Rx visit   Pt will demonstrate understanding of lymphedema precautions and prevention strategies with modified independence using a printed reference to identify at least 5 precautions and discussing how s/he may implement them into daily life to reduce risk of progression with extra time.  Baseline:Max A Goal status: GOAL MET  2.  Pt will be able to apply multilayer, knee length, gradient, compression wraps to one leg at a time with modified assistance (extra time and assistive device/s) to decrease limb volume, to limit infection risk, and to limit lymphedema progression.  Baseline: Dependent Goal status: GOAL MET  LONG TERM GOALS: Target date: 01/09/24 (12 weeks)  Given this patient's Intake score 36% on the functional outcomes FOTO tool, patient will experience an increase in function of 3 points to improve basic and instrumental ADLs performance, including lymphedema self-care.  Baseline: Max A Goal status: DEFERRED as Janyth Contes has been discontinued  2.  Given this patient's Intake score of  75% on the Lymphedema Life Impact Scale (LLIS), patient will experience a reduction of at least 5 points in her perceived level of functional impairment resulting from lymphedema to improve functional performance and quality of life (QOL). Baseline: % Goal status: PROGRESSING  3.  Pt will achieve at least a 10% volume reduction in full, RLE limb volume to return limb to typical size and shape, to limit infection risk and LE progression, to decrease pain, to improve function. Baseline: Dependent Goal status:PROGRESSING. Volumetrics deferred today by Pt request in order to increase time for manual therapy which provides pain relief. Volumetrics next visit. By visual assessment I would estimate ~10% volume reduction for entire RLE to DATE. tissue DENSITY IN  THIGH HAS REDUCED MILDLY AND SLIGHTLY IN LEG.   4.  Pt will obtain proper compression garments/devices and achieve modified independence (extra time + assistive devices) with donning/doffing to optimize limb volume reductions and limit LE  progression over time. Baseline:  Goal status: 01/04/24: PARTIALLY MET; In an effort to reduce burden of care on her spouse, Pt obtained adjustable, Velcro style R full leg, Circaid leg reduction kit for use s/p THA, but this proved not effective for controlling swelling, and were not easier to don and doff more independently. We've resumed wrapping until she is able to fit back into custom compression garments.  5.  During Intensive phase CDT , with modified independence, Pt will achieve at least 85% compliance with all lymphedema self-care home program components, including daily skin care, compression wraps and /or garments, simple self MLD and lymphatic pumping therex to habituate LE self care protocol  into ADLs for optimal LE self-management over time. Baseline: Dependent Goal status: ONGOING PLAN:  OT FREQUENCY: 2x/week  OT DURATION: 12 weeks and PRN  PLANNED INTERVENTIONS: Complete Decongestive Therapy (Intensive and supported Self-Management Phases), 97110-Therapeutic exercises, 97530- Therapeutic activity, 97535- Self Care, 16109- Manual therapy, Patient/Family  education, Taping, Manual lymph drainage, Scar mobilization, Compression bandaging, DME instructions, and skin care to reduce infection risk throughout manual therapy. Fit with replacement compression garments ASAP  PLAN FOR NEXT SESSION:  Complete RLE comparative limb volumetrics for progress note update Cont RLE/RLQ MLD Cont multilayer compression wrapping    Loel Dubonnet, MS, OTR/L, CLT-LANA 02/13/24 2:20 PM  02/13/2024, 2:20 PM

## 2024-02-13 NOTE — Therapy (Signed)
 OUTPATIENT PHYSICAL THERAPY TREATMENT   Patient Name: Elizabeth Martin MRN: [id number] DOB:11/26/1961, 63 y.o., female Today's Date: 02/13/2024  END OF SESSION:  PT End of Session - 02/13/24 0805     Visit Number 9    Number of Visits 16    Date for PT Re-Evaluation 03/01/24    Authorization Type [employer]    Progress Note Due on Visit 10    PT Start Time 0805    PT Stop Time 0845    PT Time Calculation (min) 40 min    Equipment Utilized During Treatment Gait belt    Activity Tolerance Patient tolerated treatment well;No increased pain    Behavior During Therapy WFL for tasks assessed/performed                 Past Medical History:  Diagnosis Date   Anxiety    Asthma    Autoimmune disease (HCC)    Cancer (HCC)    Endometrial lymph node removal (30)   Corneal abrasion, right 05/24/2022   Depression    GERD (gastroesophageal reflux disease)    H/O blood clots    upper rt groin and behind both knees   History of hiatal hernia    Hypertension    Lymphedema    right leg   Morphea scleroderma    PONV (postoperative nausea and vomiting)    states gets violently ill   Past Surgical History:  Procedure Laterality Date   BASAL CELL CARCINOMA EXCISION Left    CHOLECYSTECTOMY  2008   FLEXIBLE SIGMOIDOSCOPY N/A 05/19/2022   Procedure: FLEXIBLE SIGMOIDOSCOPY;  Surgeon: Toledo, Boykin Nearing, MD;  Location: [facility];  Service: Gastroenterology;  Laterality: N/A;   HERNIA REPAIR  2004   ILEOSTOMY CLOSURE N/A 08/24/2022   Procedure: ILEOSTOMY TAKEDOWN, open loop with Lynden Oxford, PA-C to assist;  Surgeon: Henrene Dodge, MD;  Location: [facility] ORS;  Service: General;  Laterality: N/A;   IVC FILTER INSERTION N/A 11/09/2023   Procedure: IVC FILTER INSERTION;  Surgeon: Annice Needy, MD;  Location: [facility] INVASIVE CV [facility];  Service: Cardiovascular;  Laterality: N/A;   IVC FILTER REMOVAL N/A 01/02/2024   Procedure: IVC FILTER REMOVAL;  Surgeon: Annice Needy, MD;  Location: [facility]  INVASIVE CV LAB;  Service: Cardiovascular;  Laterality: N/A;   PLANTAR FASCIECTOMY     ROBOTIC ASSISTED TOTAL HYSTERECTOMY WITH BILATERAL SALPINGO OOPHERECTOMY  02/14/2012   ROTATOR CUFF REPAIR Left 03/24/2022   TONSILLECTOMY     TOTAL HIP ARTHROPLASTY Right 11/14/2023   Procedure: TOTAL HIP ARTHROPLASTY - posterior;  Surgeon: Reinaldo Berber, MD;  Location: [facility] ORS;  Service: Orthopedics;  Laterality: Right;   XI ROBOTIC ASSISTED LOWER ANTERIOR RESECTION N/A 05/24/2022   Procedure: XI ROBOTIC ASSISTED LOWER ANTERIOR RESECTION;  Surgeon: Henrene Dodge, MD;  Location: [facility] ORS;  Service: General;  Laterality: N/A;   Patient Active Problem List   Diagnosis Date Noted   Constipation 01/10/2024   Nausea & vomiting 01/10/2024   S/P total right hip arthroplasty 11/14/2023   Blood coagulation disorder (HCC) 11/10/2023   PTSD (post-traumatic stress disorder) 11/10/2023   Severe episode of recurrent major depressive disorder, without psychotic features (HCC) 11/10/2023   GAD (generalized anxiety disorder) 11/10/2023   High risk medication use 11/10/2023   Anxiety and depression 03/22/2023   Cervical radiculopathy 03/16/2023   Colon cancer screening 03/15/2023   Chronic idiopathic constipation 03/15/2023   Dyspnea 03/07/2023   [place] spotted fever 03/07/2023   Strain of gastrocnemius tendon 03/07/2023  Lower abdominal pain 03/07/2023   Pain in pelvis 03/07/2023   Small bowel obstruction (HCC) 02/11/2023   Raynaud disease 02/11/2023   Polyarthralgia 02/11/2023   Edema 01/03/2023   Diverticular disease 12/10/2022   Hot flashes 12/10/2022   Pain of right lower extremity 12/10/2022   Paresthesia of lower extremity 12/10/2022   Primary insomnia 12/10/2022   Thyroid nodule 12/10/2022   Multiple joint pain 12/10/2022   Basal cell carcinoma of skin 12/10/2022   Acute bilateral low back pain without sciatica 11/10/2022   Chronic lower back pain 09/05/2022   S/P closure of ileostomy  08/24/2022   Ileostomy in place [facility])    Morphea 07/13/2022   Colonic stricture (HCC) 05/19/2022   Large bowel obstruction (HCC) 05/19/2022   Hypokalemia 05/18/2022   Abdominal pain 05/18/2022   Osteoarthritis of left knee 02/24/2022   Mixed connective tissue disease (HCC) 02/17/2022   Adhesive capsulitis of left shoulder 01/27/2022   Glenohumeral arthritis, left 01/27/2022   Microscopic hematuria 01/12/2022   Otorrhagia of right ear 12/10/2021   Elevated testosterone level in female 11/04/2021   Anti-TPO antibodies present 10/20/2021   Rash 10/13/2021   Hashimoto thyroiditis, 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   Hashimoto's disease 03/23/2021   Exposure to severe acute respiratory syndrome coronavirus 2 (SARS-CoV-2) 12/19/2020   Migraine without aura and without status migrainosus, not intractable 08/19/2020   Myofascial pain 07/30/2020   Neck pain 07/30/2020   Upper back pain 07/30/2020   Cervical facet joint syndrome 07/30/2020   Hair loss 07/14/2020   Edema of lower extremity 07/11/2020   Adenomatous colon polyp 02/26/2020   Gastroesophageal reflux disease 02/15/2020   Diarrhea 02/15/2020   Sleep disorder 01/16/2020   Hyperlipidemia 07/11/2019   Right shoulder pain 04/04/2019   Pre-operative clearance 12/13/2018   Endometrial cancer (HCC) 04/05/2018   Patellofemoral pain syndrome of right knee 03/20/2018   Gastroenteritis 12/07/2017   Fatigue 11/16/2017   Morbid obesity (HCC) 08/11/2017   Stucco keratoses 08/11/2017   Essential hypertension 08/09/2017   History of endometrial cancer 08/09/2017   Lymphedema 08/09/2017   Menopause 08/09/2017   Malignant neoplastic disease (HCC) 04/07/2017   Cellulitis 09/18/2016   Benign hypertension 06/02/2016   Asthma 06/04/1969    PCP: Pennie Banter MD   REFERRING PROVIDER: Pennie Banter MD   REFERRING DIAG: s/p R hip replacement  THERAPY DIAG:  No diagnosis found.  Rationale for Evaluation and Treatment:  Rehabilitation  ONSET DATE: 11/14/23  SUBJECTIVE:   SUBJECTIVE STATEMENT:  Pt notes no new complaints, just trying to get ready for her trip to [place].  Pt reports still having some difficulty with hip flexion with supine marches/SLR's.      PERTINENT HISTORY: s/p R hip arthropaslasty 11/14/23 with Dr. Audelia Acton posterior approach, blood coagulation disorder, PTSD, anxiety, cervical radiculopathy dypsnea, polyarthralgia, Raynaud disease, s/p closure of ileostomy, Hashimoto thyroid, endometrial cancer, menopause, endometrial cancer, lymphedema. Patient had home health PT prior to PT here.   PAIN:  Are you having pain?  4/10 Rt hip joint pain, posterior portion of hip.    PRECAUTIONS: Other: recovering from hip sx  RED FLAGS: None   WEIGHT BEARING RESTRICTIONS: No  FALLS:  Has patient fallen in last 6 months? Yes. Number of falls 2  LIVING ENVIRONMENT: Lives with: lives with their spouse Lives in: House/apartment Stairs: No Has following equipment at home: Single point cane  OCCUPATION: retired,   PLOF: Independent  PATIENT GOALS: return to hiking, working out, gardening.   NEXT MD VISIT: March 2025  OBJECTIVE:  Note: Objective measures were completed at Evaluation unless otherwise noted.  DIAGNOSTIC FINDINGS:   IMPRESSION: Right hip replacement.  No visible complicating feature.  PATIENT SURVEYS:  LEFS 5/80  COGNITION: Overall cognitive status: Within functional limits for tasks assessed    MUSCLE LENGTH: Hamstrings: limited bilaterally with R>L   LOWER EXTREMITY ROM:  Passive ROM Right eval Left eval  Hip flexion 48 flexion PROM supine   Hip extension    Hip abduction 5 degrees AROM     (Blank rows = not tested)  LOWER EXTREMITY MMT:  MMT Right eval Left eval  Hip flexion 2+ 5  Hip extension  5  Hip abduction 2+* painful 5  Hip adduction 2+ 5  Hip internal rotation    Hip external rotation    Knee flexion 3 5  Knee extension 3 5  Ankle  dorsiflexion 3 5  Ankle plantarflexion 3 5   (Blank rows = not tested)    FUNCTIONAL TESTS:  5 times sit to stand: 29 6 minute walk test:  10 meter walk test: 15 seconds with SPC   GAIT: Distance walked: 60 ft Assistive device utilized: Single point cane Level of assistance: CGA Comments: antalgic gait pattern with decreased stance time RLE. Slight vaulting pattern.  Stair negotiation: -step to pattern lead with LLE, one hand on cane one hand on rail ascending. Descending step to pattern down with RLE.                                                                                                                               TREATMENT DATE: 02/13/24   TA to improve BLE ROM and allow improved independence with ADLs and community mobility.   Lateral step over 2 half bolsters x8 bil  Forward step over half bolster x8 bil   In // bars High knee march leng of // bars x4 Lateral step with upright postural stability length of // bars,  x8  Seated: Hip circles (with leg straight), 30 sec bouts CW/CCW x2 each direction LAQ, 2x10 Marches, 2x10   Supine: SAQ 4# AW, 2x12 SLR AAROM on the RLE x 8, AROM on the LLE x 10  Bridge 2x15 Clam shell on the RLE, 2x10      PATIENT EDUCATION:  Education details: goals, POC, HEP Person educated: Patient Education method: Explanation, Demonstration, Tactile cues, and Verbal cues Education comprehension: verbalized understanding, returned demonstration, verbal cues required, tactile cues required, and needs further education  HOME EXERCISE PROGRAM: Access Code: [facility] URL: [email or web address] Date: 02/08/2024 Prepared by: Grier Rocher  Exercises - Side Stepping with Counter Support  - 1 x daily - 7 x weekly - 2 sets - 10 reps - 5 hold - Standing Hip Extension  - 1 x daily - 7 x weekly - 2 sets - 10 reps - 5 hold - Standing March with Counter Support  - 1 x daily - 7 x weekly - 2 sets -  10 reps - 5 hold -  Standing Hip Abduction with Counter Support  - 1 x daily - 7 x weekly - 2 sets - 10 reps - Seated Hamstring Stretch  - 1 x daily - 7 x weekly - 2 sets - 2 reps - 30 hold - Sit to Stand  - 1 x daily - 7 x weekly - 2 sets - 10 reps - 5 hold - Supine Heel Slide  - 1 x daily - 7 x weekly - 2 sets - 6 reps - Supine Gluteal Sets  - 1 x daily - 7 x weekly - 2 sets - 10 reps - Supine Bridge  - 1 x daily - 7 x weekly - 3 sets - 5 reps - 2-3sec hold - Clamshell  - 1 x daily - 7 x weekly - 3 sets - 10 reps - Supine Short Arc Quad  - 1 x daily - 7 x weekly - 3 sets - 15 reps - Small Range Straight Leg Raise  - 1 x daily - 7 x weekly - 3 sets - 5 reps  ASSESSMENT: CLINICAL IMPRESSION:  Pt responded well to the exercises, still limited when attempting to perform any hip flexor strengthening.  Pt still participated and gave her best effort throughout.  Pt with some residual soreness and was educated on giving herself rest breaks throughout the trip.  Pt understanding and will continue to benefit from skilled therapy to address remaining deficits in order to improve overall QoL and return to PLOF.      OBJECTIVE IMPAIRMENTS: Abnormal gait, decreased activity tolerance, decreased balance, decreased coordination, decreased endurance, decreased mobility, difficulty walking, decreased ROM, decreased strength, hypomobility, increased edema, impaired perceived functional ability, impaired flexibility, improper body mechanics, postural dysfunction, and pain.   ACTIVITY LIMITATIONS: carrying, lifting, bending, sitting, standing, squatting, sleeping, stairs, transfers, bed mobility, bathing, toileting, dressing, reach over head, hygiene/grooming, locomotion level, and caring for others  PARTICIPATION LIMITATIONS: meal prep, cleaning, laundry, interpersonal relationship, driving, shopping, community activity, and yard work  PERSONAL FACTORS: Age, Past/current experiences, Time since onset of  injury/illness/exacerbation, and 3+ comorbidities: blood coagulation disorder, PTSD, anxiety, cervical radiculopathy dypsnea, polyarthralgia, Raynaud disease, s/p closure of ileostomy, Hashimoto thyroid, endometrial cancer, menopause, endometrial cancer, lymphedema  are also affecting patient's functional outcome.   REHAB POTENTIAL: Good  CLINICAL DECISION MAKING: Evolving/moderate complexity  EVALUATION COMPLEXITY: Moderate   GOALS: Goals reviewed with patient? Yes  SHORT TERM GOALS: Target date: 01/19/2024  Patient will be independent in home exercise program to improve strength/mobility for better functional independence with ADLs. Baseline:1/30: HEP given Goal status: INITIAL  LONG TERM GOALS: Target date: 03/01/2024  Patient (> 64 years old) will complete five times sit to stand test in < 10 seconds indicating an increased LE strength and improved balance. Baseline: 1/30: 29 seconds Goal status: INITIAL  2.  Patient will increase six minute walk test distance to >1000 for progression to community ambulator and improve gait ability Baseline: 430 ft in 5 min and required seated rest to end test due to fatigue, uses SPC Goal status: INITIAL  3.  Patient will increase 10 meter walk test to >1.58m/s as to improve gait speed for better community ambulation and to reduce fall risk. Baseline: 1/30: 15 seconds with SPC  Goal status: INITIAL  4.  Patient will increase lower extremity functional scale to >60/80 to demonstrate improved functional mobility and increased tolerance with ADLs.  Baseline: 1/30: 5/80 Goal status: INITIAL  PLAN:  PT FREQUENCY:  2x/week PT DURATION: 8 weeks PLANNED INTERVENTIONS: 97164- PT Re-evaluation, 97110-Therapeutic exercises, 97530- Therapeutic activity, [id number]- Neuromuscular re-education, 97535- Self Care, [id number]- Manual therapy, [phone]- Gait training, [phone]- Splinting, 97014- Electrical stimulation (unattended), [phone]- Electrical stimulation (manual),  [id number]- Ultrasound, 95284- Traction (mechanical), Patient/Family education, Balance training, Stair training, Taping, Joint mobilization, Joint manipulation, Spinal manipulation, Spinal mobilization, Manual lymph drainage, Scar mobilization, Compression bandaging, Vestibular training, Visual/preceptual remediation/compensation, DME instructions, Cryotherapy, and Moist heat  PLAN FOR NEXT SESSION:   Dynamic gait and  R hip strength and ROM,    Nolon Bussing, PT, DPT Physical Therapist - [place]  [facility]  02/13/24, 8:06 AM

## 2024-02-15 ENCOUNTER — Encounter: Payer: Self-pay | Admitting: Occupational Therapy

## 2024-02-15 ENCOUNTER — Ambulatory Visit: Payer: Self-pay | Admitting: Licensed Clinical Social Worker

## 2024-02-15 ENCOUNTER — Ambulatory Visit: Payer: BC Managed Care – PPO | Admitting: Occupational Therapy

## 2024-02-15 ENCOUNTER — Ambulatory Visit: Admitting: Physical Therapy

## 2024-02-15 DIAGNOSIS — F3341 Major depressive disorder, recurrent, in partial remission: Secondary | ICD-10-CM

## 2024-02-15 DIAGNOSIS — I890 Lymphedema, not elsewhere classified: Secondary | ICD-10-CM | POA: Diagnosis not present

## 2024-02-15 DIAGNOSIS — F4310 Post-traumatic stress disorder, unspecified: Secondary | ICD-10-CM

## 2024-02-15 DIAGNOSIS — M25551 Pain in right hip: Secondary | ICD-10-CM

## 2024-02-15 DIAGNOSIS — M25651 Stiffness of right hip, not elsewhere classified: Secondary | ICD-10-CM

## 2024-02-15 DIAGNOSIS — R262 Difficulty in walking, not elsewhere classified: Secondary | ICD-10-CM

## 2024-02-15 DIAGNOSIS — F411 Generalized anxiety disorder: Secondary | ICD-10-CM | POA: Diagnosis not present

## 2024-02-15 NOTE — Therapy (Signed)
 OUTPATIENT PHYSICAL THERAPY TREATMENT/  PHYSICAL THERAPY PROGRESS NOTE   Dates of reporting period  01/05/2024   to   02/15/2024    Patient Name: Elizabeth Martin MRN: [id number] DOB:[birth year], 63 y.o., female Today's Date: 02/15/2024  END OF SESSION:  PT End of Session - 02/15/24 0756     Visit Number 10    Number of Visits 16    Date for PT Re-Evaluation 03/01/24    Authorization Type [employer]    Progress Note Due on Visit 10    PT Start Time 0802    PT Stop Time 0842    PT Time Calculation (min) 40 min    Equipment Utilized During Treatment Gait belt    Activity Tolerance Patient tolerated treatment well;No increased pain    Behavior During Therapy WFL for tasks assessed/performed                 Past Medical History:  Diagnosis Date   Anxiety    Asthma    Autoimmune disease (HCC)    Cancer (HCC)    Endometrial lymph node removal (30)   Corneal abrasion, right 05/24/2022   Depression    GERD (gastroesophageal reflux disease)    H/O blood clots    upper rt groin and behind both knees   History of hiatal hernia    Hypertension    Lymphedema    right leg   Morphea scleroderma    PONV (postoperative nausea and vomiting)    states gets violently ill   Past Surgical History:  Procedure Laterality Date   BASAL CELL CARCINOMA EXCISION Left    CHOLECYSTECTOMY  2008   FLEXIBLE SIGMOIDOSCOPY N/A 05/19/2022   Procedure: FLEXIBLE SIGMOIDOSCOPY;  Surgeon: Toledo, Boykin Nearing, MD;  Location: [facility];  Service: Gastroenterology;  Laterality: N/A;   HERNIA REPAIR  2004   ILEOSTOMY CLOSURE N/A 08/24/2022   Procedure: ILEOSTOMY TAKEDOWN, open loop with Lynden Oxford, PA-C to assist;  Surgeon: Henrene Dodge, MD;  Location: [facility];  Service: General;  Laterality: N/A;   IVC FILTER INSERTION N/A 11/09/2023   Procedure: IVC FILTER INSERTION;  Surgeon: Annice Needy, MD;  Location: [facility];  Service: Cardiovascular;  Laterality: N/A;   IVC FILTER  REMOVAL N/A 01/02/2024   Procedure: IVC FILTER REMOVAL;  Surgeon: Annice Needy, MD;  Location: [facility];  Service: Cardiovascular;  Laterality: N/A;   PLANTAR FASCIECTOMY     ROBOTIC ASSISTED TOTAL HYSTERECTOMY WITH BILATERAL SALPINGO OOPHERECTOMY  02/14/2012   ROTATOR CUFF REPAIR Left 03/24/2022   TONSILLECTOMY     TOTAL HIP ARTHROPLASTY Right 11/14/2023   Procedure: TOTAL HIP ARTHROPLASTY - posterior;  Surgeon: Reinaldo Berber, MD;  Location: [facility];  Service: Orthopedics;  Laterality: Right;   XI ROBOTIC ASSISTED LOWER ANTERIOR RESECTION N/A 05/24/2022   Procedure: XI ROBOTIC ASSISTED LOWER ANTERIOR RESECTION;  Surgeon: Henrene Dodge, MD;  Location: [facility];  Service: General;  Laterality: N/A;   Patient Active Problem List   Diagnosis Date Noted   Constipation 01/10/2024   Nausea & vomiting 01/10/2024   S/P total right hip arthroplasty 11/14/2023   Blood coagulation disorder (HCC) 11/10/2023   PTSD (post-traumatic stress disorder) 11/10/2023   Severe episode of recurrent major depressive disorder, without psychotic features (HCC) 11/10/2023   GAD (generalized anxiety disorder) 11/10/2023   High risk medication use 11/10/2023   Anxiety and depression 03/22/2023   Cervical radiculopathy 03/16/2023   Colon cancer screening 03/15/2023   Chronic idiopathic constipation 03/15/2023  Dyspnea 03/07/2023   [place] spotted fever 03/07/2023   Strain of gastrocnemius tendon 03/07/2023   Lower abdominal pain 03/07/2023   Pain in pelvis 03/07/2023   Small bowel obstruction (HCC) 02/11/2023   Raynaud disease 02/11/2023   Polyarthralgia 02/11/2023   Edema 01/03/2023   Diverticular disease 12/10/2022   Hot flashes 12/10/2022   Pain of right lower extremity 12/10/2022   Paresthesia of lower extremity 12/10/2022   Primary insomnia 12/10/2022   Thyroid nodule 12/10/2022   Multiple joint pain 12/10/2022   Basal cell carcinoma of skin 12/10/2022   Acute bilateral low back  pain without sciatica 11/10/2022   Chronic lower back pain 09/05/2022   S/P closure of ileostomy 08/24/2022   Ileostomy in place [facility])    Morphea 07/13/2022   Colonic stricture (HCC) 05/19/2022   Large bowel obstruction (HCC) 05/19/2022   Hypokalemia 05/18/2022   Abdominal pain 05/18/2022   Osteoarthritis of left knee 02/24/2022   Mixed connective tissue disease (HCC) 02/17/2022   Adhesive capsulitis of left shoulder 01/27/2022   Glenohumeral arthritis, left 01/27/2022   Microscopic hematuria 01/12/2022   Otorrhagia of right ear 12/10/2021   Elevated testosterone level in female 11/04/2021   Anti-TPO antibodies present 10/20/2021   Rash 10/13/2021   Hashimoto thyroiditis, 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   Hashimoto's disease 03/23/2021   Exposure to severe acute respiratory syndrome coronavirus 2 (SARS-CoV-2) 12/19/2020   Migraine without aura and without status migrainosus, not intractable 08/19/2020   Myofascial pain 07/30/2020   Neck pain 07/30/2020   Upper back pain 07/30/2020   Cervical facet joint syndrome 07/30/2020   Hair loss 07/14/2020   Edema of lower extremity 07/11/2020   Adenomatous colon polyp 02/26/2020   Gastroesophageal reflux disease 02/15/2020   Diarrhea 02/15/2020   Sleep disorder 01/16/2020   Hyperlipidemia 07/11/2019   Right shoulder pain 04/04/2019   Pre-operative clearance 12/13/2018   Endometrial cancer (HCC) 04/05/2018   Patellofemoral pain syndrome of right knee 03/20/2018   Gastroenteritis 12/07/2017   Fatigue 11/16/2017   Morbid obesity (HCC) 08/11/2017   Stucco keratoses 08/11/2017   Essential hypertension 08/09/2017   History of endometrial cancer 08/09/2017   Lymphedema 08/09/2017   Menopause 08/09/2017   Malignant neoplastic disease (HCC) 04/07/2017   Cellulitis 09/18/2016   Benign hypertension 06/02/2016   Asthma 06/04/1969    PCP: Pennie Banter MD   REFERRING PROVIDER: Pennie Banter MD   REFERRING DIAG: s/p R hip  replacement  THERAPY DIAG:  Stiffness of right hip, not elsewhere classified  Pain in right hip  Difficulty in walking, not elsewhere classified  Rationale for Evaluation and Treatment: Rehabilitation  ONSET DATE: 11/14/23  SUBJECTIVE:   SUBJECTIVE STATEMENT:  Pt notes no new complaints, just trying to get ready for her trip to [place]. Recently found out that you can take Nsaid and tylenol in conjunction. Reports that it is helping with pain management.     PERTINENT HISTORY: s/p R hip arthropaslasty 11/14/23 with Dr. Audelia Acton posterior approach, blood coagulation disorder, PTSD, anxiety, cervical radiculopathy dypsnea, polyarthralgia, Raynaud disease, s/p closure of ileostomy, Hashimoto thyroid, endometrial cancer, menopause, endometrial cancer, lymphedema. Patient had home health PT prior to PT here.   PAIN:  Are you having pain?  4/10 Rt hip joint pain, posterior portion of hip.    PRECAUTIONS: Other: recovering from hip sx  RED FLAGS: None   WEIGHT BEARING RESTRICTIONS: No  FALLS:  Has patient fallen in last 6 months? Yes. Number of falls 2  LIVING ENVIRONMENT: Lives with: lives  with their spouse Lives in: House/apartment Stairs: No Has following equipment at home: Single point cane  OCCUPATION: retired,   PLOF: Independent  PATIENT GOALS: return to hiking, working out, gardening.   NEXT MD VISIT: March 2025   OBJECTIVE:  Note: Objective measures were completed at Evaluation unless otherwise noted.  DIAGNOSTIC FINDINGS:   IMPRESSION: Right hip replacement.  No visible complicating feature.  PATIENT SURVEYS:  LEFS 5/80  COGNITION: Overall cognitive status: Within functional limits for tasks assessed    MUSCLE LENGTH: Hamstrings: limited bilaterally with R>L   LOWER EXTREMITY ROM:  Passive ROM Right eval Left eval  Hip flexion 48 flexion PROM supine   Hip extension    Hip abduction 5 degrees AROM     (Blank rows = not tested)  LOWER  EXTREMITY MMT:  MMT Right eval Left eval  Hip flexion 2+ 5  Hip extension  5  Hip abduction 2+* painful 5  Hip adduction 2+ 5  Hip internal rotation    Hip external rotation    Knee flexion 3 5  Knee extension 3 5  Ankle dorsiflexion 3 5  Ankle plantarflexion 3 5   (Blank rows = not tested)    FUNCTIONAL TESTS:  5 times sit to stand: 29 6 minute walk test:  10 meter walk test: 15 seconds with SPC   GAIT: Distance walked: 60 ft Assistive device utilized: Single point cane Level of assistance: CGA Comments: antalgic gait pattern with decreased stance time RLE. Slight vaulting pattern.  Stair negotiation: -step to pattern lead with LLE, one hand on cane one hand on rail ascending. Descending step to pattern down with RLE.                                                                                                                               TREATMENT DATE: 02/15/24   Nustep: level 1-2 x 7 min for AAROM on BLE. No pain noted on RLE. Performed stand pivot transfers and gait without AD to and from nustep with only mild antalgia due to heaviness of RLE.    10 Meter Walk Test: Patient instructed to walk 10 meters (32.8 ft) as quickly and as safely as possible at their normal speed x2 and at a fast speed x2. Time measured from 2 meter mark to 8 meter mark to accommodate ramp-up and ramp-down.  Average without AD  speed: 0.95 m/s Average with SPC  speed: 0.9 m/s Cut off scores: <0.4 m/s = household Ambulator, 0.4-0.8 m/s = limited community Ambulator, >0.8 m/s = community Ambulator, >1.2 m/s = crossing a street, <1.0 = increased fall risk MCID 0.05 m/s (small), 0.13 m/s (moderate), 0.06 m/s (significant)  (ANPTA Core Set of Outcome Measures for Adults with Neurologic Conditions, 2018)   6 Min Walk Test:  Instructed patient to ambulate as quickly and as safely as possible for 6 minutes using LRAD. Patient was allowed to take standing rest breaks without stopping  the test,  but if the patient required a sitting rest break the clock would be stopped and the test would be over.  Results: 870 feet SPC. No assist. Results indicate that the patient has reduced endurance with ambulation compared to age matched norms.  Age Matched Norms: 91-69 yo M: 37 F: 42, 63-79 yo M: 55 F: 471, 55-89 yo M: 417 F: 392 MDC: 58.21 meters (190.98 feet) or 50 meters (ANPTA Core Set of Outcome Measures for Adults with Neurologic Conditions, 2018)  Pt performed 5 time sit<>stand (5xSTS):  19.41 sec (>15 sec indicates increased fall risk)     PATIENT EDUCATION:  Education details: goals, POC, HEP Person educated: Patient Education method: Explanation, Demonstration, Tactile cues, and Verbal cues Education comprehension: verbalized understanding, returned demonstration, verbal cues required, tactile cues required, and needs further education  HOME EXERCISE PROGRAM: Access Code: [facility] URL: [email or web address] Date: 02/08/2024 Prepared by: Grier Rocher  Exercises - Side Stepping with Counter Support  - 1 x daily - 7 x weekly - 2 sets - 10 reps - 5 hold - Standing Hip Extension  - 1 x daily - 7 x weekly - 2 sets - 10 reps - 5 hold - Standing March with Counter Support  - 1 x daily - 7 x weekly - 2 sets - 10 reps - 5 hold - Standing Hip Abduction with Counter Support  - 1 x daily - 7 x weekly - 2 sets - 10 reps - Seated Hamstring Stretch  - 1 x daily - 7 x weekly - 2 sets - 2 reps - 30 hold - Sit to Stand  - 1 x daily - 7 x weekly - 2 sets - 10 reps - 5 hold - Supine Heel Slide  - 1 x daily - 7 x weekly - 2 sets - 6 reps - Supine Gluteal Sets  - 1 x daily - 7 x weekly - 2 sets - 10 reps - Supine Bridge  - 1 x daily - 7 x weekly - 3 sets - 5 reps - 2-3sec hold - Clamshell  - 1 x daily - 7 x weekly - 3 sets - 10 reps - Supine Short Arc Quad  - 1 x daily - 7 x weekly - 3 sets - 15 reps - Small Range Straight Leg Raise  - 1 x daily - 7 x weekly - 3 sets - 5  reps  ASSESSMENT: CLINICAL IMPRESSION:   PT treatment consisted of completion of progress note to assess progress towards LTG. Pt demonstrates improved balance evidenced by decreased time on 5x STS, improved endurance and activity tolerance with increased 6 min walk test to 817ft from 456ft, and ability to ambulate forfull 6 min . Improved gait speed and functio noted on LEFS with improvement from 6% to 25%.  Patient's condition has the potential to improve in response to therapy. Maximum improvement is yet to be obtained. The anticipated improvement is attainable and reasonable in a generally predictable time. Pt understanding and will continue to benefit from skilled therapy to address remaining deficits in order to improve overall QoL and return to PLOF.      OBJECTIVE IMPAIRMENTS: Abnormal gait, decreased activity tolerance, decreased balance, decreased coordination, decreased endurance, decreased mobility, difficulty walking, decreased ROM, decreased strength, hypomobility, increased edema, impaired perceived functional ability, impaired flexibility, improper body mechanics, postural dysfunction, and pain.   ACTIVITY LIMITATIONS: carrying, lifting, bending, sitting, standing, squatting, sleeping, stairs, transfers, bed mobility, bathing, toileting, dressing, reach over head, hygiene/grooming,  locomotion level, and caring for others  PARTICIPATION LIMITATIONS: meal prep, cleaning, laundry, interpersonal relationship, driving, shopping, community activity, and yard work  PERSONAL FACTORS: Age, Past/current experiences, Time since onset of injury/illness/exacerbation, and 3+ comorbidities: blood coagulation disorder, PTSD, anxiety, cervical radiculopathy dypsnea, polyarthralgia, Raynaud disease, s/p closure of ileostomy, Hashimoto thyroid, endometrial cancer, menopause, endometrial cancer, lymphedema  are also affecting patient's functional outcome.   REHAB POTENTIAL: Good  CLINICAL DECISION  MAKING: Evolving/moderate complexity  EVALUATION COMPLEXITY: Moderate   GOALS: Goals reviewed with patient? Yes  SHORT TERM GOALS: Target date: 01/19/2024  Patient will be independent in home exercise program to improve strength/mobility for better functional independence with ADLs. Baseline:1/30: HEP given 3/12: pt reports completing 3-4 days a week depending on soreness.  Goal status:  MET  LONG TERM GOALS: Target date: 03/01/2024  Patient (> 22 years old) will complete five times sit to stand test in < 10 seconds indicating an increased LE strength and improved balance. Baseline: 1/30: 29 seconds 3/12: 19.41 Goal status: INITIAL  2.  Patient will increase six minute walk test distance to >1000 for progression to community ambulator and improve gait ability Baseline: 430 ft in 5 min and required seated rest to end test due to fatigue, uses SPC 852ft with SPC and no standing or seated rest break.  Goal status: in progress   3.  Patient will increase 10 meter walk test to >1.21m/s as to improve gait speed for better community ambulation and to reduce fall risk. Baseline: 1/30: 15 seconds with SPC  Without SPC: 10.63 and 10.38 speed: 0.95 m/s; with SPC 10.98 and 11.18 Goal status: in progress   4.  Patient will increase lower extremity functional scale to >60/80 to demonstrate improved functional mobility and increased tolerance with ADLs.  Baseline: 1/30: 5/80 3/12: 20/80 Goal status: in progress   PLAN:  PT FREQUENCY: 2x/week PT DURATION: 8 weeks PLANNED INTERVENTIONS: 97164- PT Re-evaluation, 97110-Therapeutic exercises, 97530- Therapeutic activity, 97112- Neuromuscular re-education, 97535- Self Care, 91478- Manual therapy, 97116- Gait training, [phone]- Splinting, 97014- Electrical stimulation (unattended), [phone]- Electrical stimulation (manual), [id number]- Ultrasound, 57846- Traction (mechanical), Patient/Family education, Balance training, Stair training, Taping, Joint  mobilization, Joint manipulation, Spinal manipulation, Spinal mobilization, Manual lymph drainage, Scar mobilization, Compression bandaging, Vestibular training, Visual/preceptual remediation/compensation, DME instructions, Cryotherapy, and Moist heat  PLAN FOR NEXT SESSION:   Dynamic gait and  R hip strength and ROM,    Grier Rocher PT, DPT  Physical Therapist - [place]  [facility]  7:56 AM 02/15/24

## 2024-02-15 NOTE — Therapy (Deleted)
 OUTPATIENT PHYSICAL THERAPY TREATMENT/  PHYSICAL THERAPY PROGRESS NOTE   Dates of reporting period  01/05/2024   to   02/15/2024    Patient Name: Elizabeth Martin MRN: [id number] DOB:[birth year], 63 y.o., female Today's Date: 02/15/2024  END OF SESSION:        Past Medical History:  Diagnosis Date   Anxiety    Asthma    Autoimmune disease (HCC)    Cancer (HCC)    Endometrial lymph node removal (30)   Corneal abrasion, right 05/24/2022   Depression    GERD (gastroesophageal reflux disease)    H/O blood clots    upper rt groin and behind both knees   History of hiatal hernia    Hypertension    Lymphedema    right leg   Morphea scleroderma    PONV (postoperative nausea and vomiting)    states gets violently ill   Past Surgical History:  Procedure Laterality Date   BASAL CELL CARCINOMA EXCISION Left    CHOLECYSTECTOMY  2008   FLEXIBLE SIGMOIDOSCOPY N/A 05/19/2022   Procedure: FLEXIBLE SIGMOIDOSCOPY;  Surgeon: Toledo, Boykin Nearing, MD;  Location: [facility] ENDOSCOPY;  Service: Gastroenterology;  Laterality: N/A;   HERNIA REPAIR  2004   ILEOSTOMY CLOSURE N/A 08/24/2022   Procedure: ILEOSTOMY TAKEDOWN, open loop with Lynden Oxford, PA-C to assist;  Surgeon: Henrene Dodge, MD;  Location: [facility] ORS;  Service: General;  Laterality: N/A;   IVC FILTER INSERTION N/A 11/09/2023   Procedure: IVC FILTER INSERTION;  Surgeon: Annice Needy, MD;  Location: [facility] INVASIVE CV [facility];  Service: Cardiovascular;  Laterality: N/A;   IVC FILTER REMOVAL N/A 01/02/2024   Procedure: IVC FILTER REMOVAL;  Surgeon: Annice Needy, MD;  Location: [facility];  Service: Cardiovascular;  Laterality: N/A;   PLANTAR FASCIECTOMY     ROBOTIC ASSISTED TOTAL HYSTERECTOMY WITH BILATERAL SALPINGO OOPHERECTOMY  02/14/2012   ROTATOR CUFF REPAIR Left 03/24/2022   TONSILLECTOMY     TOTAL HIP ARTHROPLASTY Right 11/14/2023   Procedure: TOTAL HIP ARTHROPLASTY - posterior;  Surgeon: Reinaldo Berber, MD;  Location: [facility]  ORS;  Service: Orthopedics;  Laterality: Right;   XI ROBOTIC ASSISTED LOWER ANTERIOR RESECTION N/A 05/24/2022   Procedure: XI ROBOTIC ASSISTED LOWER ANTERIOR RESECTION;  Surgeon: Henrene Dodge, MD;  Location: [facility] ORS;  Service: General;  Laterality: N/A;   Patient Active Problem List   Diagnosis Date Noted   Constipation 01/10/2024   Nausea & vomiting 01/10/2024   S/P total right hip arthroplasty 11/14/2023   Blood coagulation disorder (HCC) 11/10/2023   PTSD (post-traumatic stress disorder) 11/10/2023   Severe episode of recurrent major depressive disorder, without psychotic features (HCC) 11/10/2023   GAD (generalized anxiety disorder) 11/10/2023   High risk medication use 11/10/2023   Anxiety and depression 03/22/2023   Cervical radiculopathy 03/16/2023   Colon cancer screening 03/15/2023   Chronic idiopathic constipation 03/15/2023   Dyspnea 03/07/2023   [place] spotted fever 03/07/2023   Strain of gastrocnemius tendon 03/07/2023   Lower abdominal pain 03/07/2023   Pain in pelvis 03/07/2023   Small bowel obstruction (HCC) 02/11/2023   Raynaud disease 02/11/2023   Polyarthralgia 02/11/2023   Edema 01/03/2023   Diverticular disease 12/10/2022   Hot flashes 12/10/2022   Pain of right lower extremity 12/10/2022   Paresthesia of lower extremity 12/10/2022   Primary insomnia 12/10/2022   Thyroid nodule 12/10/2022   Multiple joint pain 12/10/2022   Basal cell carcinoma of skin 12/10/2022   Acute bilateral low back pain without  sciatica 11/10/2022   Chronic lower back pain 09/05/2022   S/P closure of ileostomy 08/24/2022   Ileostomy in place [facility])    Morphea 07/13/2022   Colonic stricture (HCC) 05/19/2022   Large bowel obstruction (HCC) 05/19/2022   Hypokalemia 05/18/2022   Abdominal pain 05/18/2022   Osteoarthritis of left knee 02/24/2022   Mixed connective tissue disease (HCC) 02/17/2022   Adhesive capsulitis of left shoulder 01/27/2022   Glenohumeral arthritis,  left 01/27/2022   Microscopic hematuria 01/12/2022   Otorrhagia of right ear 12/10/2021   Elevated testosterone level in female 11/04/2021   Anti-TPO antibodies present 10/20/2021   Rash 10/13/2021   Hashimoto thyroiditis, 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   Hashimoto's disease 03/23/2021   Exposure to severe acute respiratory syndrome coronavirus 2 (SARS-CoV-2) 12/19/2020   Migraine without aura and without status migrainosus, not intractable 08/19/2020   Myofascial pain 07/30/2020   Neck pain 07/30/2020   Upper back pain 07/30/2020   Cervical facet joint syndrome 07/30/2020   Hair loss 07/14/2020   Edema of lower extremity 07/11/2020   Adenomatous colon polyp 02/26/2020   Gastroesophageal reflux disease 02/15/2020   Diarrhea 02/15/2020   Sleep disorder 01/16/2020   Hyperlipidemia 07/11/2019   Right shoulder pain 04/04/2019   Pre-operative clearance 12/13/2018   Endometrial cancer (HCC) 04/05/2018   Patellofemoral pain syndrome of right knee 03/20/2018   Gastroenteritis 12/07/2017   Fatigue 11/16/2017   Morbid obesity (HCC) 08/11/2017   Stucco keratoses 08/11/2017   Essential hypertension 08/09/2017   History of endometrial cancer 08/09/2017   Lymphedema 08/09/2017   Menopause 08/09/2017   Malignant neoplastic disease (HCC) 04/07/2017   Cellulitis 09/18/2016   Benign hypertension 06/02/2016   Asthma 06/04/1969    PCP: Pennie Banter MD   REFERRING PROVIDER: Pennie Banter MD   REFERRING DIAG: s/p R hip replacement  THERAPY DIAG:  Lymphedema, not elsewhere classified  Rationale for Evaluation and Treatment: Rehabilitation  ONSET DATE: 11/14/23  SUBJECTIVE:   SUBJECTIVE STATEMENT:  Pt notes no new complaints, just trying to get ready for her trip to [place]. Recently found out that you can take Nsaid and tylenol in conjunction. Reports that it is helping with pain management.     PERTINENT HISTORY: s/p R hip arthropaslasty 11/14/23 with Dr. Audelia Acton posterior approach,  blood coagulation disorder, PTSD, anxiety, cervical radiculopathy dypsnea, polyarthralgia, Raynaud disease, s/p closure of ileostomy, Hashimoto thyroid, endometrial cancer, menopause, endometrial cancer, lymphedema. Patient had home health PT prior to PT here.   PAIN:  Are you having pain?  4/10 Rt hip joint pain, posterior portion of hip.    PRECAUTIONS: Other: recovering from hip sx  RED FLAGS: None   WEIGHT BEARING RESTRICTIONS: No  FALLS:  Has patient fallen in last 6 months? Yes. Number of falls 2  LIVING ENVIRONMENT: Lives with: lives with their spouse Lives in: House/apartment Stairs: No Has following equipment at home: Single point cane  OCCUPATION: retired,   PLOF: Independent  PATIENT GOALS: return to hiking, working out, gardening.   NEXT MD VISIT: March 2025   OBJECTIVE:  Note: Objective measures were completed at Evaluation unless otherwise noted.  DIAGNOSTIC FINDINGS:   IMPRESSION: Right hip replacement.  No visible complicating feature.  PATIENT SURVEYS:  LEFS 5/80  COGNITION: Overall cognitive status: Within functional limits for tasks assessed    MUSCLE LENGTH: Hamstrings: limited bilaterally with R>L   LOWER EXTREMITY ROM:  Passive ROM Right eval Left eval  Hip flexion 48 flexion PROM supine   Hip extension  Hip abduction 5 degrees AROM     (Blank rows = not tested)  LOWER EXTREMITY MMT:  MMT Right eval Left eval  Hip flexion 2+ 5  Hip extension  5  Hip abduction 2+* painful 5  Hip adduction 2+ 5  Hip internal rotation    Hip external rotation    Knee flexion 3 5  Knee extension 3 5  Ankle dorsiflexion 3 5  Ankle plantarflexion 3 5   (Blank rows = not tested)    FUNCTIONAL TESTS:  5 times sit to stand: 29 6 minute walk test:  10 meter walk test: 15 seconds with SPC   GAIT: Distance walked: 60 ft Assistive device utilized: Single point cane Level of assistance: CGA Comments: antalgic gait pattern with decreased  stance time RLE. Slight vaulting pattern.  Stair negotiation: -step to pattern lead with LLE, one hand on cane one hand on rail ascending. Descending step to pattern down with RLE.                                                                                                                               TREATMENT DATE: 02/15/24   Nustep: level 1-2 x 7 min for AAROM on BLE. No pain noted on RLE. Performed stand pivot transfers and gait without AD to and from nustep with only mild antalgia due to heaviness of RLE.    10 Meter Walk Test: Patient instructed to walk 10 meters (32.8 ft) as quickly and as safely as possible at their normal speed x2 and at a fast speed x2. Time measured from 2 meter mark to 8 meter mark to accommodate ramp-up and ramp-down.  Average without AD  speed: 0.95 m/s Average with SPC  speed: 0.9 m/s Cut off scores: <0.4 m/s = household Ambulator, 0.4-0.8 m/s = limited community Ambulator, >0.8 m/s = community Ambulator, >1.2 m/s = crossing a street, <1.0 = increased fall risk MCID 0.05 m/s (small), 0.13 m/s (moderate), 0.06 m/s (significant)  (ANPTA Core Set of Outcome Measures for Adults with Neurologic Conditions, 2018)   6 Min Walk Test:  Instructed patient to ambulate as quickly and as safely as possible for 6 minutes using LRAD. Patient was allowed to take standing rest breaks without stopping the test, but if the patient required a sitting rest break the clock would be stopped and the test would be over.  Results: 870 feet SPC. No assist. Results indicate that the patient has reduced endurance with ambulation compared to age matched norms.  Age Matched Norms: 68-69 yo M: 80 F: 45, 83-79 yo M: 31 F: 471, 89-89 yo M: 417 F: 392 MDC: 58.21 meters (190.98 feet) or 50 meters (ANPTA Core Set of Outcome Measures for Adults with Neurologic Conditions, 2018)  Pt performed 5 time sit<>stand (5xSTS):  19.41 sec (>15 sec indicates increased fall risk)     PATIENT  EDUCATION:  Education details: goals, POC, HEP Person educated: Patient Education method:  Explanation, Demonstration, Tactile cues, and Verbal cues Education comprehension: verbalized understanding, returned demonstration, verbal cues required, tactile cues required, and needs further education  HOME EXERCISE PROGRAM: Access Code: [facility] URL: [email or web address] Date: 02/08/2024 Prepared by: Grier Rocher  Exercises - Side Stepping with Counter Support  - 1 x daily - 7 x weekly - 2 sets - 10 reps - 5 hold - Standing Hip Extension  - 1 x daily - 7 x weekly - 2 sets - 10 reps - 5 hold - Standing March with Counter Support  - 1 x daily - 7 x weekly - 2 sets - 10 reps - 5 hold - Standing Hip Abduction with Counter Support  - 1 x daily - 7 x weekly - 2 sets - 10 reps - Seated Hamstring Stretch  - 1 x daily - 7 x weekly - 2 sets - 2 reps - 30 hold - Sit to Stand  - 1 x daily - 7 x weekly - 2 sets - 10 reps - 5 hold - Supine Heel Slide  - 1 x daily - 7 x weekly - 2 sets - 6 reps - Supine Gluteal Sets  - 1 x daily - 7 x weekly - 2 sets - 10 reps - Supine Bridge  - 1 x daily - 7 x weekly - 3 sets - 5 reps - 2-3sec hold - Clamshell  - 1 x daily - 7 x weekly - 3 sets - 10 reps - Supine Short Arc Quad  - 1 x daily - 7 x weekly - 3 sets - 15 reps - Small Range Straight Leg Raise  - 1 x daily - 7 x weekly - 3 sets - 5 reps  ASSESSMENT: CLINICAL IMPRESSION:   PT treatment consisted of completion of progress note to assess progress towards LTG. Pt demonstrates improved balance evidenced by decreased time on 5x STS, improved endurance and activity tolerance with increased 6 min walk test to 835ft from 484ft, and ability to ambulate forfull 6 min . Improved gait speed and functio noted on LEFS with improvement from 6% to 25%.  Patient's condition has the potential to improve in response to therapy. Maximum improvement is yet to be obtained. The anticipated improvement is attainable and  reasonable in a generally predictable time. Pt understanding and will continue to benefit from skilled therapy to address remaining deficits in order to improve overall QoL and return to PLOF.      OBJECTIVE IMPAIRMENTS: Abnormal gait, decreased activity tolerance, decreased balance, decreased coordination, decreased endurance, decreased mobility, difficulty walking, decreased ROM, decreased strength, hypomobility, increased edema, impaired perceived functional ability, impaired flexibility, improper body mechanics, postural dysfunction, and pain.   ACTIVITY LIMITATIONS: carrying, lifting, bending, sitting, standing, squatting, sleeping, stairs, transfers, bed mobility, bathing, toileting, dressing, reach over head, hygiene/grooming, locomotion level, and caring for others  PARTICIPATION LIMITATIONS: meal prep, cleaning, laundry, interpersonal relationship, driving, shopping, community activity, and yard work  PERSONAL FACTORS: Age, Past/current experiences, Time since onset of injury/illness/exacerbation, and 3+ comorbidities: blood coagulation disorder, PTSD, anxiety, cervical radiculopathy dypsnea, polyarthralgia, Raynaud disease, s/p closure of ileostomy, Hashimoto thyroid, endometrial cancer, menopause, endometrial cancer, lymphedema  are also affecting patient's functional outcome.   REHAB POTENTIAL: Good  CLINICAL DECISION MAKING: Evolving/moderate complexity  EVALUATION COMPLEXITY: Moderate   GOALS: Goals reviewed with patient? Yes  SHORT TERM GOALS: Target date: 01/19/2024  Patient will be independent in home exercise program to improve strength/mobility for better functional independence with ADLs. Baseline:1/30: HEP given 3/12: pt reports  completing 3-4 days a week depending on soreness.  Goal status:  MET  LONG TERM GOALS: Target date: 03/01/2024  Patient (> 70 years old) will complete five times sit to stand test in < 10 seconds indicating an increased LE strength and  improved balance. Baseline: 1/30: 29 seconds 3/12: 19.41 Goal status: INITIAL  2.  Patient will increase six minute walk test distance to >1000 for progression to community ambulator and improve gait ability Baseline: 430 ft in 5 min and required seated rest to end test due to fatigue, uses SPC 88ft with SPC and no standing or seated rest break.  Goal status: in progress   3.  Patient will increase 10 meter walk test to >1.61m/s as to improve gait speed for better community ambulation and to reduce fall risk. Baseline: 1/30: 15 seconds with SPC  Without SPC: 10.63 and 10.38 speed: 0.95 m/s; with SPC 10.98 and 11.18 Goal status: in progress   4.  Patient will increase lower extremity functional scale to >60/80 to demonstrate improved functional mobility and increased tolerance with ADLs.  Baseline: 1/30: 5/80 3/12: 20/80 Goal status: in progress   PLAN:  PT FREQUENCY: 2x/week PT DURATION: 8 weeks PLANNED INTERVENTIONS: 97164- PT Re-evaluation, 97110-Therapeutic exercises, 97530- Therapeutic activity, 97112- Neuromuscular re-education, 97535- Self Care, 09811- Manual therapy, 97116- Gait training, [phone]- Splinting, 97014- Electrical stimulation (unattended), [phone]- Electrical stimulation (manual), [id number]- Ultrasound, 13086- Traction (mechanical), Patient/Family education, Balance training, Stair training, Taping, Joint mobilization, Joint manipulation, Spinal manipulation, Spinal mobilization, Manual lymph drainage, Scar mobilization, Compression bandaging, Vestibular training, Visual/preceptual remediation/compensation, DME instructions, Cryotherapy, and Moist heat  PLAN FOR NEXT SESSION:   Dynamic gait and  R hip strength and ROM,    Grier Rocher PT, DPT  Physical Therapist - [facility]  [facility]  10:13 AM 02/15/24

## 2024-02-15 NOTE — Progress Notes (Signed)
 THERAPIST PROGRESS NOTE  Virtual Visit via Video Note  I connected with Elizabeth Martin on 02/15/24 at  1:00 PM EDT by a video enabled telemedicine application and verified that I am speaking with the correct person using two identifiers.  Location: Patient: Address on file  Provider: [facility]   I discussed the limitations of evaluation and management by telemedicine and the availability of in person appointments. The patient expressed understanding and agreed to proceed.   I discussed the assessment and treatment plan with the patient. The patient was provided an opportunity to ask questions and all were answered. The patient agreed with the plan and demonstrated an understanding of the instructions.   The patient was advised to call back or seek an in-person evaluation if the symptoms worsen or if the condition fails to improve as anticipated.  I provided 54 minutes of non-face-to-face time during this encounter.   Dereck Leep, LCSW   Session Time: 1-1:55pm  Participation Level: Active  Behavioral Response: CasualAlertEuthymic  Type of Therapy: Individual Therapy  Treatment Goals addressed:  Goal: LTG: Elimination of maladaptive behaviors and thinking patterns which interfere with resolution of trauma as evidenced by self report     Dates: Start:  11/25/23    Expected End:  04/24/24       Disciplines: Interdisciplinary, PROVIDER                    Goal: LTG: Develop and implement effective coping skills to carry out normal responsibilities and participate constructively in relationships as evidenced by self report     Dates: Start:  11/25/23    Expected End:  04/24/24       Disciplines: Interdisciplinary, PROVIDER                    Goal: LTG: Recall traumatic events without becoming overwhelmed with negative emotions     Dates: Start:  11/25/23    Expected End:  04/24/24       Disciplines: Interdisciplinary, PROVIDER                    Goal: LTG: Pt  reports "think about these things in the past without having such an impact on my life."          ProgressTowards Goals: Progressing  Interventions: Supportive and Other: EMDR  Summary: Elizabeth Martin is a 63 y.o. female who presents with symptoms of depression and trauma. Patient identifies symptoms to include reexperiencing, uncontrollable worry, and hypervigilance. Pt was oriented times 5. Pt was cooperative and engaged. Pt denies SI/HI/AVH.    Patient utilized therapeutic space to process new realizations and her journey towards forgiveness. Patient continues to report a trigger for anxiety to include short bursts of flashbacks at night just as she is trying to fall asleep related to previous sessions of EMDR.  Patient processed previousdisagreement with sibling. Clinician utilized session to engage patient in EMDR to process this memory decreasing patient's SUD from 6 to 0 and increasing her VOC score to 7 instilling the belief "I do not need the approval of others."  Suicidal/Homicidal: Nowithout intent/plan  Therapist Response: Clinician used active and supportive reflection to create a safe environment for patient to process recent life stressors. Clinician assessed for current symptoms, stressors, safety since last session. Clinician continued EMDR through processing memories on her target sequence plan.   Plan: Return again in 1 week.  Diagnosis: PTSD (post-traumatic stress disorder)  Recurrent major depressive disorder,  in partial remission (HCC)  GAD (generalized anxiety disorder)   Collaboration of Care: [facility]EB psychiatrist can access notes and cln. Will review psychiatrists' notes. Check in with the patient and will see LCSW per availability. Patient agreed with treatment recommendations.   Patient/Guardian was advised Release of Information must be obtained prior to any record release in order to collaborate their care with an outside provider. Patient/Guardian was advised if  they have not already done so to contact the registration department to sign all necessary forms in order for [place] to release information regarding their care.   Consent: Patient/Guardian gives verbal consent for treatment and assignment of benefits for services provided during this visit. Patient/Guardian expressed understanding and agreed to proceed.   Dereck Leep, LCSW 02/15/2024

## 2024-02-15 NOTE — Therapy (Signed)
 OUTPATIENT OCCUPATIONAL THERAPY TREATMENT NOTE AND PROGRESS REPORT  LOWER EXTREMITY LYMPHEDEMA  Patient Name: Elizabeth Martin MRN: [id number] DOB:1961/01/21, 63 y.o., female Today's Date: 02/15/2024  REPORTING PERIOD: 01/11/24 - 02/14/24  END OF SESSION:  Lymphedema Episode 2   OT End of Session - 02/15/24 1014     Visit Number 20    Number of Visits 36    Date for OT Re-Evaluation 04/16/24    OT Start Time 0905    OT Stop Time 1003    OT Time Calculation (min) 58 min    Activity Tolerance Patient tolerated treatment well;No increased pain    Behavior During Therapy WFL for tasks assessed/performed              Past Medical History:  Diagnosis Date   Anxiety    Asthma    Autoimmune disease (HCC)    Cancer (HCC)    Endometrial lymph node removal (30)   Corneal abrasion, right 05/24/2022   Depression    GERD (gastroesophageal reflux disease)    H/O blood clots    upper rt groin and behind both knees   History of hiatal hernia    Hypertension    Lymphedema    right leg   Morphea scleroderma    PONV (postoperative nausea and vomiting)    states gets violently ill   Past Surgical History:  Procedure Laterality Date   BASAL CELL CARCINOMA EXCISION Left    CHOLECYSTECTOMY  2008   FLEXIBLE SIGMOIDOSCOPY N/A 05/19/2022   Procedure: FLEXIBLE SIGMOIDOSCOPY;  Surgeon: Toledo, Boykin Nearing, MD;  Location: [facility] ENDOSCOPY;  Service: Gastroenterology;  Laterality: N/A;   HERNIA REPAIR  2004   ILEOSTOMY CLOSURE N/A 08/24/2022   Procedure: ILEOSTOMY TAKEDOWN, open loop with Lynden Oxford, PA-C to assist;  Surgeon: Henrene Dodge, MD;  Location: [facility] ORS;  Service: General;  Laterality: N/A;   IVC FILTER INSERTION N/A 11/09/2023   Procedure: IVC FILTER INSERTION;  Surgeon: Annice Needy, MD;  Location: [facility] INV[facility]IVE CV [facility];  Service: Cardiovascular;  Laterality: N/A;   IVC FILTER REMOVAL N/A 01/02/2024   Procedure: IVC FILTER REMOVAL;  Surgeon: Annice Needy, MD;  Location: [facility]  INVASIVE CV LAB;  Service: Cardiovascular;  Laterality: N/A;   PLANTAR FASCIECTOMY     ROBOTIC ASSISTED TOTAL HYSTERECTOMY WITH BILATERAL SALPINGO OOPHERECTOMY  02/14/2012   ROTATOR CUFF REPAIR Left 03/24/2022   TONSILLECTOMY     TOTAL HIP ARTHROPLASTY Right 11/14/2023   Procedure: TOTAL HIP ARTHROPLASTY - posterior;  Surgeon: Reinaldo Berber, MD;  Location: [facility] ORS;  Service: Orthopedics;  Laterality: Right;   XI ROBOTIC ASSISTED LOWER ANTERIOR RESECTION N/A 05/24/2022   Procedure: XI ROBOTIC ASSISTED LOWER ANTERIOR RESECTION;  Surgeon: Henrene Dodge, MD;  Location: [facility] ORS;  Service: General;  Laterality: N/A;   Patient Active Problem List   Diagnosis Date Noted   Constipation 01/10/2024   Nausea & vomiting 01/10/2024   S/P total right hip arthroplasty 11/14/2023   Blood coagulation disorder (HCC) 11/10/2023   PTSD (post-traumatic stress disorder) 11/10/2023   Severe episode of recurrent major depressive disorder, without psychotic features (HCC) 11/10/2023   GAD (generalized anxiety disorder) 11/10/2023   High risk medication use 11/10/2023   Anxiety and depression 03/22/2023   Cervical radiculopathy 03/16/2023   Colon cancer screening 03/15/2023   Chronic idiopathic constipation 03/15/2023   Dyspnea 03/07/2023   [facility] spotted fever 03/07/2023   Strain of gastrocnemius tendon 03/07/2023   Lower abdominal pain 03/07/2023   Pain in pelvis  03/07/2023   Small bowel obstruction (HCC) 02/11/2023   Raynaud disease 02/11/2023   Polyarthralgia 02/11/2023   Edema 01/03/2023   Diverticular disease 12/10/2022   Hot flashes 12/10/2022   Pain of right lower extremity 12/10/2022   Paresthesia of lower extremity 12/10/2022   Primary insomnia 12/10/2022   Thyroid nodule 12/10/2022   Multiple joint pain 12/10/2022   Basal cell carcinoma of skin 12/10/2022   Acute bilateral low back pain without sciatica 11/10/2022   Chronic lower back pain 09/05/2022   S/P closure of ileostomy  08/24/2022   Ileostomy in place [facility])    Morphea 07/13/2022   Colonic stricture (HCC) 05/19/2022   Large bowel obstruction (HCC) 05/19/2022   Hypokalemia 05/18/2022   Abdominal pain 05/18/2022   Osteoarthritis of left knee 02/24/2022   Mixed connective tissue disease (HCC) 02/17/2022   Adhesive capsulitis of left shoulder 01/27/2022   Glenohumeral arthritis, left 01/27/2022   Microscopic hematuria 01/12/2022   Otorrhagia of right ear 12/10/2021   Elevated testosterone level in female 11/04/2021   Anti-TPO antibodies present 10/20/2021   Rash 10/13/2021   Hashimoto thyroiditis, 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   Hashimoto's disease 03/23/2021   Exposure to severe acute respiratory syndrome coronavirus 2 (SARS-CoV-2) 12/19/2020   Migraine without aura and without status migrainosus, not intractable 08/19/2020   Myofascial pain 07/30/2020   Neck pain 07/30/2020   Upper back pain 07/30/2020   Cervical facet joint syndrome 07/30/2020   Hair loss 07/14/2020   Edema of lower extremity 07/11/2020   Adenomatous colon polyp 02/26/2020   Gastroesophageal reflux disease 02/15/2020   Diarrhea 02/15/2020   Sleep disorder 01/16/2020   Hyperlipidemia 07/11/2019   Right shoulder pain 04/04/2019   Pre-operative clearance 12/13/2018   Endometrial cancer (HCC) 04/05/2018   Patellofemoral pain syndrome of right knee 03/20/2018   Gastroenteritis 12/07/2017   Fatigue 11/16/2017   Morbid obesity (HCC) 08/11/2017   Stucco keratoses 08/11/2017   Essential hypertension 08/09/2017   History of endometrial cancer 08/09/2017   Lymphedema 08/09/2017   Menopause 08/09/2017   Malignant neoplastic disease (HCC) 04/07/2017   Cellulitis 09/18/2016   Benign hypertension 06/02/2016   Asthma 06/04/1969    PCP: Pennie Banter, MD  REFERRING PROVIDER: Pennie Banter, MD  REFERRING DIAG: I89.0   THERAPY DIAG:  Lymphedema, not elsewhere classified  Rationale for Evaluation and Treatment:  Rehabilitation  ONSET DATE: 2013  (Cancer-related, endometrial 2013)  SUBJECTIVE:                                                                                                                                                                                           SUBJECTIVE STATEMENT:Patient  presents to s/p R hip arthroplasty 11/14/23. Pt presents to OT for cancer-related lymphedema care to RLE/RLQ after exacerbation following THA. Pt is very motivated to improve lymphedema prior to her upcoming , long awaited trip to [place] with her spouse. Pt reports she is took Tylenol and Celebrex together this morning and had less soreness with and after PT. Pt does not rate pain numerically.  PERTINENT HISTORY: Asthma, Autoimmune Disease (Connective Tissue Disease), Endometrial Ca w/ LND ( 30 bilateral pelvic and periaortic LN), adjuvant chemotherapy and XRT,   H/O blood clots, RLE/RLQ lymphedema, Robotic assisted total hysterectomy w/ bilateral oophorectomy L Rotator Cuff repair, S/P ileostomy 2/2 colon stricture 05/2022; s/p R hip arthroplasty 11/11/23.   PAIN:  Are you having pain? YES. See subjective; not rated /10 Pain location: RLE, R hip, cervical spine Pain description: sore, aching, heavy, full, tight Aggravating factors: standing, walking, extended dependent sitting Relieving factors: elevation, movement  PRECAUTIONS: Fall and Other: LYMPHEDEMA  WEIGHT BEARING RESTRICTIONS: Yes 5# lifting restriction- shoulder  FALLS:  Has patient fallen in last 6 months? No  LIVING ENVIRONMENT: Lives with: lives with their spouse Lives in: House/apartment Stairs: No;  Has following equipment at home: Single point cane, Environmental consultant - 2 wheeled, Environmental consultant - 4 wheeled, and Wheelchair (manual)  OCCUPATION: retired Control and instrumentation engineer: gardening, hiking, biking, dancing- unable to participate in any of these due to pain and swelling  HAND DOMINANCE: right   PRIOR LEVEL OF FUNCTION: Independent with basic ADLs,  Independent with household mobility without device, Independent with community mobility without device, Requires assistive device for independence, Needs assistance with homemaking, Needs assistance with transfers, and Leisure: decreased  social participation for leisure pursuits due to impaired mobility and pain  PATIENT GOALS:  Be able to move freely without pain Reduce limb volume to be able to lift limb for basic daily ADLs and functional ambulation Reduce limb volume to increase ability to perform lower body dressing and bathing and grooming Reduce limb to increase body image  OBJECTIVE: Note: Objective measures were completed at Evaluation unless otherwise noted.  COGNITION:  Overall cognitive status: Within functional limits for tasks assessed   OBSERVATIONS / OTHER ASSESSMENTS:   POSTURE: WFL  LE ROM: WFL, but Limited mildly at R knee and ankle due to girth, skin approximation, and joint pain  LE MMT: WFL  Mild, Stage  II, Bilateral Lower Extremity Lymphedema 2/2 CVI and Obesity  Skin  Description Hyper-Keratosis Peau d' Orange Shiny Tight Fibrotic/ Indurated Fatty Doughy Spongy/ boggy   x x x x R>L Severe morphea at  R groin   x   Skin dry Flaky WNL Macerated   mildly sclerotic     Color Redness Varicosities Blanching Hemosiderin Stain Mottled   x x x   x   Odor Malodorous Yeast Fungal infection  WNL      x   Temperature Warm Cool wnl    x     Pitting Edema   1+ 2+ 3+ 4+ Non-pitting         x   Girth Symmetrical Asymmetrical                   Distribution    R>L RLE toes to groin    Stemmer Sign Positive Negative   +    Lymphorrhea History Of:  Present Absent     x    Wounds History Of Present Absent Venous Arterial Pressure Sheer     x  Signs of Infection Redness Warmth Erythema Acute Swelling Drainage Borders                    Sensation Light Touch Deep pressure Hypersensitivity   In tact Impaired In tact Impaired Absent  Impaired   x  x  x     Nails WNL   Fungus nail dystrophy   x     Hair Growth Symmetrical Asymmetrical    R>L   Skin Creases Base of toes  Ankles   Base of Fingers knees       Abdominal pannus Thigh Lobules  Face/neck   x x  x        BLE COMPARATIVE LIMB VOLUMETRICS 10/10/23  LANDMARK RIGHT  10/10/23  R LEG (A-D) 5466.5 ml  R THIGH (E-G) 9186.6 ml  R FULL LIMB (A-G) 14653.1 ml  Limb Volume differential (LVD)  LVD for LEG = 44.1%, R>L LVD for THIGH= 33.98%, R>L LVD for FULL lower extremity 37.8%, R>L  Volume change since last 08/11/22 R LEG is INCREASED in volume by 59.6%. L THIGH is INCREASED by 38 %, and RLE full limb is INCREASED by 45.38%.  Volume change overall V  (Blank rows = not tested)  LANDMARK LEFT  10/12/23  L LEG (A-D) 3053.6 ml  L THIGH (E-G) 6064.8 ml  L  FULL LIMB (A-G) 9118.4 ml  Limb Volume differential (LVD)  %  Volume change since initial %  Volume change overall %  (Blank rows = not tested)   10 th VISIT PROGRESS NOTE RLE COMPARATIVE LIMB VOLUMETRICS 01/04/24: Deferred until next visit by Pt request.  TBA next session  LANDMARK RIGHT    R LEG (A-D) TBA  R THIGH (E-G) TBA  R FULL LIMB (A-G) TBA  Limb Volume differential (LVD)    Volume change since last 08/11/22 TBA  Volume change overall TBA  (Blank rows = not tested)  20 th VISIT PROGRESS NOTE RLE COMPARATIVE LIMB VOLUMETRICS TBA next session  LANDMARK RIGHT    R LEG (A-D) %  R THIGH (E-G) %  R FULL LIMB (A-G) %  Limb Volume differential (LVD)    Volume change since last measured on 10/12/23 %  Volume change since commencing OT for CDT %   ENDOMETRIAL CA-related LYMPHEDEMA Hx:  SURGERY TYPE/DATE: 2013 NUMBER OF LYMPH NODES REMOVED: 30 by report- bilateral pelvic and periaortic CHEMOTHERAPY: yes RADIATION:yes INFECTIONS: no hx cellulitis GAIT: Distance walked: >500 ft Assistive device utilized: single point cane Level of assistance: Modified independence- extra time Comments: R  limp  LYMPHEDEMA LIFE IMPACT SCALE (LLIS): Intake 10/12/23 75% (The extent to which lymphedema related problems impacted your life over the past week)  FOTO (functional outcome measure):10/10/23 INTAKE: 36% No Out take score. FOTO assessment discontinued by clinic.  PATIENT EDUCATION:  Education details: Continued Pt/ CG edu for lymphedema self care home program throughout session. Topics include outcome of comparative limb volumetrics- starting limb volume differentials (LVDs), technology and gradient techniques used for short stretch, multilayer compression wrapping, simple self-MLD, therapeutic lymphatic pumping exercises, skin/nail care, LE precautions,. compression garment recommendations and specifications, wear and care schedule and compression garment donning / doffing w assistive devices. Discussed progress towards all OT goals since commencing CDT. All questions answered to the Pt's satisfaction. Good return. Person educated: Patient Education method: Explanation, Demonstration, and Handouts Education comprehension: verbalized understanding, returned demonstration, and needs further education  LE SELF-CARE HOME  PROGRAM: Simple self-MLD/daily to affected quadrant and body part At  least 2 x daily BLE Lymphatic Pumping There ex 1 set of 10 reps each, in order, bilaterally Daily skin care to affected body part to limit infection risk and increase skin excursion Compression Bandaging Intensive stage compression: multilayer short stretch wraps with gradient techniques. One limb at a time. Length patient dependent. Self-management Phase: Appropriate daytime compression garment and hours-of-sleep device  Compression garments: Custom-made gradient compression garments and hours-of-sleep devices are medically necessary because they are uniquely sized and shaped to fit the exact dimensions of the affected extremities and to provide accurate and consistent gradient compression and containment,  essential  for optimally managing chronic, progressive lymphedema. The convoluted HOS devices are medically necessary to facilitate increased lymphatic circulation and limit fibrosis formation when sleeping. Multiple custom compression garments are needed for optimal hygiene to limit infection risk. Custom compression garments should be replaced q 3-6 months When worn consistently for optimal lipo-lymphedema self-management over time.  Pt will benefit from [id number]- Circaid reduction kit whole leg to increase independence with lymphedema self care, to  reduce limb volume  for body symmetry and balance, and to limit infection risk and progression.  ASSESSMENT:  CLINICAL IMPRESSION: Volumetrics postponed until next week to focus on manual therapy for relief of lymphedema-related pain/discomfort. To date by visual assessment I would estimate no more than a 10% volume reduction. DENSITY IN THIGH HAS REDUCED only SLIGHTLY. Morphea at R groin presents a significant obstacle to limb volume decongestion and fibrosis reduction.  Progress is slow. Lymphedema management  with combination of progressive, secondary lymphedema with connective tissue disease is very stubborn and challenging. Please review GOALS section for detailed reporting on progress.  Pt tolerated MLD to RLE/RLQ without increased pain. We also used deep fibrosis techniques to distal leg and medial malleolus with palpable softening in tissue density after session.Applied multilayer compression wraps as established using gradient techniques.  Cont as per POC.  10/10/23 Lymphedema Episode 2, Initial OT Evaluation : Vanessa Kampf is a 34 y a female presenting with chronic, progressive, moderate, Stage II, RLE/RLQ cancer -related lymphedema with onset 11 years ago after Rx for endometrial cancer. Pt is well known to this therapist as she has successfully undergone Complete Decongestive Therapy (CDT)  this clinic bin the past. Pt reports recent exacerbation of  RLE swelling worsened as inflammation in L hip has worsened over time.  Pt has a hip replacement scheduled in late December here [facility]. RE limb volumetrics today reveal that R LEG volume is dramatically increased by 59.65 since last visit. R LEG VOLUME is INCREASED in volume by 59.6%. L THIGH is INCREASED by 38 %, and RLE full limb is INCREASED by 45.38%. Pt presents with LE-related skin changes. Prolonged inflammation in the R hip joint has likely overloaded  already RLE/RLQ lymphatics.  RLE/RLQ lymphedema limits Pt's ability to perform basic and instrumental ADLs, including functional ambulation, mobility and transfers, grooming , lower body bathing and dressing, skin inspection and skin care. Pt has difficulty  reaching her lower legs and feet to apply compression wraps and don/doff        compression stockings. LE limits ability to P[perform instrumental ADLs, including driving, yard work and home management activities. Lymphedema limits her ability to participate in leisure pursuits and productive activities, and it negatively impacts body image, life roles and quality of life.   Pt will benefit from an Intensive and follow along course of lymphedema. CDT will consist of Manual Lymphatic gainage   (MLD), skin care, therapeutic exercise and  compression, wraps initially then garments. Pt will need assistance throughout CDT   for applying compression wraps due to limited hip AROM and decreased skin flexibility. Without skilled Occupational Therapy for lymphedema care, lymphedema will progress and further functional decline is expected.   In prep for upcoming hip replacement OT will educate Pt re assistive devices, including tub transfer bench and elevated toilet seat, in keeping with hip precautions.  OBJECTIVE IMPAIRMENTS: Abnormal gait, decreased activity tolerance, decreased balance, decreased knowledge of use of DME, decreased mobility, difficulty walking, decreased ROM, decreased strength, increased  edema, decreased skin flexibility, increased fascial restrictions, impaired sensation, pain, and chronic, progressive LLE/LLQ lymphatic swelling and associated pain.   ADL LIMITATIONS: carrying, lifting, bending, sitting, standing, squatting, sleeping, stairs, transfers, bed mobility, bathing, dressing, hygiene/grooming, and productive activities, leisure pursuits, social participation, body image  driving, shopping, housework, yard work, cooking, meal prep  PERSONAL FACTORS: Past/current experiences and 3+ comorbidities: Morphea Scleroderma, Mixed connective tissue disease, and osteoarthritis  are also affecting patient's functional outcome.   REHAB POTENTIAL: Good  EVALUATION COMPLEXITY: Moderate  GOALS: Goals reviewed with patient? Yes  SHORT TERM GOALS: Target date: 4th OT Rx visit   Pt will demonstrate understanding of lymphedema precautions and prevention strategies with modified independence using a printed reference to identify at least 5 precautions and discussing how s/he may implement them into daily life to reduce risk of progression with extra time.  Baseline:Max A Goal status: GOAL MET  2.  Pt will be able to apply multilayer, knee length, gradient, compression wraps to one leg at a time with modified assistance (extra time and assistive device/s) to decrease limb volume, to limit infection risk, and to limit lymphedema progression.  Baseline: Dependent Goal status: GOAL MET  LONG TERM GOALS: Target date: 01/09/24 (12 weeks)  Given this patient's Intake score 36% on the functional outcomes FOTO tool, patient will experience an increase in function of 3 points to improve basic and instrumental ADLs performance, including lymphedema self-care.  Baseline: Max A Goal status: DEFERRED as Janyth Contes has been discontinued  2.  Given this patient's Intake score of  75% on the Lymphedema Life Impact Scale (LLIS), patient will experience a reduction of at least 5 points in her perceived level  of functional impairment resulting from lymphedema to improve functional performance and quality of life (QOL). Baseline: % Goal status: PROGRESSING  3.  Pt will achieve at least a 10% volume reduction in full, RLE limb volume to return limb to typical size and shape, to limit infection risk and LE progression, to decrease pain, to improve function. Baseline: Dependent Goal status:PROGRESSING.  Volumetrics deferred until next week to focus on manual therapy and relief of pain/discomfort. To date by visual assessment I would estimate no more than a 10% volume reduction. DENSITY IN THIGH HAS REDUCED only SLIGHTLY. Morphea at R groin continues to present a significant obstacle to limb volume decongestion and fibrosis reduction.  Progress is slow. Lymphedema management  with combination of progressive, secondary lymphedema with connective tissue disease is very stubborn and challenging.   4.  Pt will obtain proper compression garments/devices and achieve modified independence (extra time + assistive devices) with donning/doffing to optimize limb volume reductions and limit LE  progression over time. Baseline:  Goal status: 01/04/24: PARTIALLY MET; In an effort to reduce burden of care on her spouse, Pt obtained adjustable, Velcro style R full leg, Circaid leg reduction kit for use s/p THA, but this proved not effective for controlling swelling, and  were not easier to don and doff more independently. We've resumed wrapping until she is able to fit back into custom compression garments. 02/14/24: Ongoing  5.  During Intensive phase CDT , with modified independence, Pt will achieve at least 85% compliance with all lymphedema self-care home program components, including daily skin care, compression wraps and /or garments, simple self MLD and lymphatic pumping therex to habituate LE self care protocol  into ADLs for optimal LE self-management over time. Baseline: Dependent Goal status: 02/14/24: GOAL MET and  EXCEEDED PLAN:  OT FREQUENCY: 2x/week  OT DURATION: 12 weeks and PRN  PLANNED INTERVENTIONS: Complete Decongestive Therapy (Intensive and supported Self-Management Phases), 97110-Therapeutic exercises, 97530- Therapeutic activity, 97535- Self Care, 16109- Manual therapy, Patient/Family education, Taping, Manual lymph drainage, Scar mobilization, Compression bandaging, DME instructions, and skin care to reduce infection risk throughout manual therapy. Fit with replacement compression garments ASAP  PLAN FOR NEXT SESSION:  Complete RLE comparative limb volumetrics for progress note update Cont RLE/RLQ MLD Cont multilayer compression wrapping    Loel Dubonnet, MS, OTR/L, CLT-LANA 02/15/24 10:21 AM  02/15/2024, 10:21 AM

## 2024-02-20 ENCOUNTER — Encounter

## 2024-02-20 ENCOUNTER — Encounter: Payer: BC Managed Care – PPO | Admitting: Occupational Therapy

## 2024-02-21 ENCOUNTER — Ambulatory Visit (INDEPENDENT_AMBULATORY_CARE_PROVIDER_SITE_OTHER): Payer: Self-pay | Admitting: Licensed Clinical Social Worker

## 2024-02-21 DIAGNOSIS — F411 Generalized anxiety disorder: Secondary | ICD-10-CM | POA: Diagnosis not present

## 2024-02-21 DIAGNOSIS — F3341 Major depressive disorder, recurrent, in partial remission: Secondary | ICD-10-CM | POA: Diagnosis not present

## 2024-02-21 DIAGNOSIS — F4310 Post-traumatic stress disorder, unspecified: Secondary | ICD-10-CM

## 2024-02-21 NOTE — Progress Notes (Signed)
 THERAPIST PROGRESS NOTE  Session Time: 11-11:50am  Participation Level: Active  Behavioral Response: CasualAlertEuthymic  Type of Therapy: Individual Therapy  Treatment Goals addressed:      Goal: LTG: Elimination of maladaptive behaviors and thinking patterns which interfere with resolution of trauma as evidenced by self report     Dates: Start:  11/25/23    Expected End:  04/24/24       Disciplines: Interdisciplinary, PROVIDER                    Goal: LTG: Develop and implement effective coping skills to carry out normal responsibilities and participate constructively in relationships as evidenced by self report     Dates: Start:  11/25/23    Expected End:  04/24/24       Disciplines: Interdisciplinary, PROVIDER                        Goal: LTG: Recall traumatic events without becoming overwhelmed with negative emotions     Dates: Start:  11/25/23    Expected End:  04/24/24       Disciplines: Interdisciplinary, PROVIDER                        Goal: LTG: Pt reports "think about these things in the past without having such an impact on my life."                ProgressTowards Goals: Progressing  Interventions: CBT, Supportive, and Reframing  Summary: Elizabeth Martin is a 63 y.o. female who presents with symptoms of depression and trauma. Patient identifies symptoms to include reexperiencing, uncontrollable worry, and hypervigilance. Pt was oriented times 5. Pt was cooperative and engaged. Pt denies SI/HI/AVH.    Space to process previous EMDR session.  Patient denied presentation of trauma symptoms and after reviewing her target sequence plan acknowledge completion of EMDR treatment.  Clinician and patient utilized session to reflect on progress.  Addressed patient's improvement with trauma symptoms with patient acknowledging she no longer personalize others' behaviors.  Patient reflected on current health dynamics and ways in which she is coping with uncontrollable  factors.  Acknowledged ways in which patient is managing her anxiety citing anxiety symptoms are less frequent and their duration is shorter.  In patient's next session, clinician and patient will review patient's progress to review of her treatment plan.  Suicidal/Homicidal: Nowithout intent/plan  Therapist Response: Clinician used active and supportive reflection to create a safe environment for patient to process recent life stressors. Clinician assessed for current symptoms, stressors, safety since last session. Clinician completed EMDR with patient.  Patient and clinician reflected on patient's progress since beginning therapy.  Plan: Return again in 2 weeks.  Diagnosis:PTSD (post-traumatic stress disorder)  Recurrent major depressive disorder, in partial remission (HCC)  GAD (generalized anxiety disorder)   Collaboration of Care: AEB psychiatrist can access notes and cln. Will review psychiatrists' notes. Check in with the patient and will see LCSW per availability. Patient agreed with treatment recommendations.   Patient/Guardian was advised Release of Information must be obtained prior to any record release in order to collaborate their care with an outside provider. Patient/Guardian was advised if they have not already done so to contact the registration department to sign all necessary forms in order for [place] to release information regarding their care.   Consent: Patient/Guardian gives verbal consent for treatment and assignment of benefits for services provided during this visit.  Patient/Guardian expressed understanding and agreed to proceed.   Dereck Leep, LCSW 02/21/2024

## 2024-02-22 ENCOUNTER — Encounter: Admitting: Physical Therapy

## 2024-02-22 ENCOUNTER — Encounter: Payer: BC Managed Care – PPO | Admitting: Occupational Therapy

## 2024-02-27 ENCOUNTER — Encounter

## 2024-02-27 ENCOUNTER — Encounter: Payer: BC Managed Care – PPO | Admitting: Occupational Therapy

## 2024-02-29 ENCOUNTER — Encounter: Payer: BC Managed Care – PPO | Admitting: Occupational Therapy

## 2024-02-29 ENCOUNTER — Encounter

## 2024-03-05 ENCOUNTER — Ambulatory Visit: Payer: BC Managed Care – PPO | Admitting: Occupational Therapy

## 2024-03-05 ENCOUNTER — Ambulatory Visit

## 2024-03-06 ENCOUNTER — Ambulatory Visit: Payer: Self-pay | Admitting: Licensed Clinical Social Worker

## 2024-03-07 ENCOUNTER — Ambulatory Visit: Payer: BC Managed Care – PPO | Admitting: Occupational Therapy

## 2024-03-07 ENCOUNTER — Encounter: Payer: BC Managed Care – PPO | Admitting: Occupational Therapy

## 2024-03-07 ENCOUNTER — Ambulatory Visit: Admitting: Physical Therapy

## 2024-03-12 ENCOUNTER — Ambulatory Visit: Payer: BC Managed Care – PPO | Attending: Family Medicine | Admitting: Occupational Therapy

## 2024-03-12 ENCOUNTER — Ambulatory Visit

## 2024-03-12 DIAGNOSIS — R262 Difficulty in walking, not elsewhere classified: Secondary | ICD-10-CM

## 2024-03-12 DIAGNOSIS — M25651 Stiffness of right hip, not elsewhere classified: Secondary | ICD-10-CM | POA: Insufficient documentation

## 2024-03-12 DIAGNOSIS — M6281 Muscle weakness (generalized): Secondary | ICD-10-CM | POA: Insufficient documentation

## 2024-03-12 DIAGNOSIS — I890 Lymphedema, not elsewhere classified: Secondary | ICD-10-CM | POA: Diagnosis present

## 2024-03-12 DIAGNOSIS — M25551 Pain in right hip: Secondary | ICD-10-CM | POA: Insufficient documentation

## 2024-03-12 DIAGNOSIS — R2681 Unsteadiness on feet: Secondary | ICD-10-CM | POA: Diagnosis present

## 2024-03-12 NOTE — Therapy (Signed)
 OUTPATIENT PHYSICAL THERAPY TREATMENT/ RECERT    Patient Name: Elizabeth Martin MRN: [id number] DOB:09/27/1961, 63 y.o., female Today's Date: 03/12/2024  END OF SESSION:  PT End of Session - 03/12/24 1318     Visit Number 11    Number of Visits 35    Date for PT Re-Evaluation 06/04/24    Authorization Type [employer]    Progress Note Due on Visit 20    PT Start Time 1315    PT Stop Time 1358    PT Time Calculation (min) 43 min    Equipment Utilized During Treatment Gait belt    Activity Tolerance Patient tolerated treatment well;No increased pain    Behavior During Therapy WFL for tasks assessed/performed                  Past Medical History:  Diagnosis Date   Anxiety    Asthma    Autoimmune disease (HCC)    Cancer (HCC)    Endometrial lymph node removal (30)   Corneal abrasion, right 05/24/2022   Depression    GERD (gastroesophageal reflux disease)    H/O blood clots    upper rt groin and behind both knees   History of hiatal hernia    Hypertension    Lymphedema    right leg   Morphea scleroderma    PONV (postoperative nausea and vomiting)    states gets violently ill   Past Surgical History:  Procedure Laterality Date   BASAL CELL CARCINOMA EXCISION Left    CHOLECYSTECTOMY  2008   FLEXIBLE SIGMOIDOSCOPY N/A 05/19/2022   Procedure: FLEXIBLE SIGMOIDOSCOPY;  Surgeon: Toledo, Boykin Nearing, MD;  Location: [facility];  Service: Gastroenterology;  Laterality: N/A;   HERNIA REPAIR  2004   ILEOSTOMY CLOSURE N/A 08/24/2022   Procedure: ILEOSTOMY TAKEDOWN, open loop with Lynden Oxford, PA-C to assist;  Surgeon: Henrene Dodge, MD;  Location: [facility] ORS;  Service: General;  Laterality: N/A;   IVC FILTER INSERTION N/A 11/09/2023   Procedure: IVC FILTER INSERTION;  Surgeon: Annice Needy, MD;  Location: [facility];  Service: Cardiovascular;  Laterality: N/A;   IVC FILTER REMOVAL N/A 01/02/2024   Procedure: IVC FILTER REMOVAL;  Surgeon: Annice Needy, MD;   Location: [facility];  Service: Cardiovascular;  Laterality: N/A;   PLANTAR FASCIECTOMY     ROBOTIC ASSISTED TOTAL HYSTERECTOMY WITH BILATERAL SALPINGO OOPHERECTOMY  02/14/2012   ROTATOR CUFF REPAIR Left 03/24/2022   TONSILLECTOMY     TOTAL HIP ARTHROPLASTY Right 11/14/2023   Procedure: TOTAL HIP ARTHROPLASTY - posterior;  Surgeon: Reinaldo Berber, MD;  Location: [facility] ORS;  Service: Orthopedics;  Laterality: Right;   XI ROBOTIC ASSISTED LOWER ANTERIOR RESECTION N/A 05/24/2022   Procedure: XI ROBOTIC ASSISTED LOWER ANTERIOR RESECTION;  Surgeon: Henrene Dodge, MD;  Location: [facility] ORS;  Service: General;  Laterality: N/A;   Patient Active Problem List   Diagnosis Date Noted   Constipation 01/10/2024   Nausea & vomiting 01/10/2024   S/P total right hip arthroplasty 11/14/2023   Blood coagulation disorder (HCC) 11/10/2023   PTSD (post-traumatic stress disorder) 11/10/2023   Severe episode of recurrent major depressive disorder, without psychotic features (HCC) 11/10/2023   GAD (generalized anxiety disorder) 11/10/2023   High risk medication use 11/10/2023   Anxiety and depression 03/22/2023   Cervical radiculopathy 03/16/2023   Colon cancer screening 03/15/2023   Chronic idiopathic constipation 03/15/2023   Dyspnea 03/07/2023   [place] spotted fever 03/07/2023   Strain of gastrocnemius tendon  03/07/2023   Lower abdominal pain 03/07/2023   Pain in pelvis 03/07/2023   Small bowel obstruction (HCC) 02/11/2023   Raynaud disease 02/11/2023   Polyarthralgia 02/11/2023   Edema 01/03/2023   Diverticular disease 12/10/2022   Hot flashes 12/10/2022   Pain of right lower extremity 12/10/2022   Paresthesia of lower extremity 12/10/2022   Primary insomnia 12/10/2022   Thyroid nodule 12/10/2022   Multiple joint pain 12/10/2022   Basal cell carcinoma of skin 12/10/2022   Acute bilateral low back pain without sciatica 11/10/2022   Chronic lower back pain 09/05/2022   S/P closure  of ileostomy 08/24/2022   Ileostomy in place [facility])    Morphea 07/13/2022   Colonic stricture (HCC) 05/19/2022   Large bowel obstruction (HCC) 05/19/2022   Hypokalemia 05/18/2022   Abdominal pain 05/18/2022   Osteoarthritis of left knee 02/24/2022   Mixed connective tissue disease (HCC) 02/17/2022   Adhesive capsulitis of left shoulder 01/27/2022   Glenohumeral arthritis, left 01/27/2022   Microscopic hematuria 01/12/2022   Otorrhagia of right ear 12/10/2021   Elevated testosterone level in female 11/04/2021   Anti-TPO antibodies present 10/20/2021   Rash 10/13/2021   Hashimoto thyroiditis, 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   Hashimoto's disease 03/23/2021   Exposure to severe acute respiratory syndrome coronavirus 2 (SARS-CoV-2) 12/19/2020   Migraine without aura and without status migrainosus, not intractable 08/19/2020   Myofascial pain 07/30/2020   Neck pain 07/30/2020   Upper back pain 07/30/2020   Cervical facet joint syndrome 07/30/2020   Hair loss 07/14/2020   Edema of lower extremity 07/11/2020   Adenomatous colon polyp 02/26/2020   Gastroesophageal reflux disease 02/15/2020   Diarrhea 02/15/2020   Sleep disorder 01/16/2020   Hyperlipidemia 07/11/2019   Right shoulder pain 04/04/2019   Pre-operative clearance 12/13/2018   Endometrial cancer (HCC) 04/05/2018   Patellofemoral pain syndrome of right knee 03/20/2018   Gastroenteritis 12/07/2017   Fatigue 11/16/2017   Morbid obesity (HCC) 08/11/2017   Stucco keratoses 08/11/2017   Essential hypertension 08/09/2017   History of endometrial cancer 08/09/2017   Lymphedema 08/09/2017   Menopause 08/09/2017   Malignant neoplastic disease (HCC) 04/07/2017   Cellulitis 09/18/2016   Benign hypertension 06/02/2016   Asthma 06/04/1969    PCP: Pennie Banter MD   REFERRING PROVIDER: Pennie Banter MD   REFERRING DIAG: s/p R hip replacement  THERAPY DIAG:  Stiffness of right hip, not elsewhere classified  Pain in right  hip  Difficulty in walking, not elsewhere classified  Rationale for Evaluation and Treatment: Rehabilitation  ONSET DATE: 11/14/23  SUBJECTIVE:   SUBJECTIVE STATEMENT:  Pt reports sore after 10 day visit to [place]. Patient states she wanted to see as many sights as possible and pushed herself and overdid it.    PERTINENT HISTORY: s/p R hip arthropaslasty 11/14/23 with Dr. Audelia Acton posterior approach, blood coagulation disorder, PTSD, anxiety, cervical radiculopathy dypsnea, polyarthralgia, Raynaud disease, s/p closure of ileostomy, Hashimoto thyroid, endometrial cancer, menopause, endometrial cancer, lymphedema. Patient had home health PT prior to PT here.   PAIN:  Are you having pain?  4/10 Rt hip joint pain, posterior portion of hip.    PRECAUTIONS: Other: recovering from hip sx  RED FLAGS: None   WEIGHT BEARING RESTRICTIONS: No  FALLS:  Has patient fallen in last 6 months? Yes. Number of falls 2  LIVING ENVIRONMENT: Lives with: lives with their spouse Lives in: House/apartment Stairs: No Has following equipment at home: Single point cane  OCCUPATION: retired,   PLOF: Independent  PATIENT  GOALS: return to hiking, working out, gardening.   NEXT MD VISIT: March 2025   OBJECTIVE:  Note: Objective measures were completed at Evaluation unless otherwise noted.  DIAGNOSTIC FINDINGS:   IMPRESSION: Right hip replacement.  No visible complicating feature.  PATIENT SURVEYS:  LEFS 5/80  COGNITION: Overall cognitive status: Within functional limits for tasks assessed    MUSCLE LENGTH: Hamstrings: limited bilaterally with R>L   LOWER EXTREMITY ROM:  Passive ROM Right eval Left eval  Hip flexion 48 flexion PROM supine   Hip extension    Hip abduction 5 degrees AROM     (Blank rows = not tested)  LOWER EXTREMITY MMT:  MMT Right eval Left eval  Hip flexion 2+ 5  Hip extension  5  Hip abduction 2+* painful 5  Hip adduction 2+ 5  Hip internal rotation     Hip external rotation    Knee flexion 3 5  Knee extension 3 5  Ankle dorsiflexion 3 5  Ankle plantarflexion 3 5   (Blank rows = not tested)    FUNCTIONAL TESTS:  5 times sit to stand: 29 6 minute walk test:  10 meter walk test: 15 seconds with SPC   GAIT: Distance walked: 60 ft Assistive device utilized: Single point cane Level of assistance: CGA Comments: antalgic gait pattern with decreased stance time RLE. Slight vaulting pattern.  Stair negotiation: -step to pattern lead with LLE, one hand on cane one hand on rail ascending. Descending step to pattern down with RLE.                                                                                                                               TREATMENT DATE: 03/12/24      10 Meter Walk Test: Patient instructed to walk 10 meters (32.8 ft) as quickly and as safely as possible at their normal speed x2 and at a fast speed x2. Time measured from 2 meter mark to 8 meter mark to accommodate ramp-up and ramp-down.  Average without AD  speed: 1.0 m/s Cut off scores: <0.4 m/s = household Ambulator, 0.4-0.8 m/s = limited community Ambulator, >0.8 m/s = community Ambulator, >1.2 m/s = crossing a street, <1.0 = increased fall risk MCID 0.05 m/s (small), 0.13 m/s (moderate), 0.06 m/s (significant)  (ANPTA Core Set of Outcome Measures for Adults with Neurologic Conditions, 2018)   6 Min Walk Test:  Instructed patient to ambulate as quickly and as safely as possible for 6 minutes using LRAD. Patient was allowed to take standing rest breaks without stopping the test, but if the patient required a sitting rest break the clock would be stopped and the test would be over.  Results: 950 feet SPC = 290 meters. No assist. Results indicate that the patient has reduced endurance with ambulation compared to age matched norms.  Age Matched Norms: 36-69 yo M: 51 F: 67, 11-79 yo M: 5 F: 471, 91-89 yo M: 27 F:  392 MDC: 58.21 meters (190.98 feet)  or 50 meters (ANPTA Core Set of Outcome Measures for Adults with Neurologic Conditions, 2018)  Pt performed 5 time sit<>stand (5xSTS): 17.44 seconds- without UE support (>15 sec indicates increased fall risk)   NMR:  Instructed in tandem standing- hold up to 30 sec x 3  Instructed in single leg standing- attempting to hold as long as possible up to 10 sec each LE- multiple attempts.   Self care/Home management:  -Review of hip strengthening- bridging, Sidelye clamshell, standing hip abd/ext, SLR.     PATIENT EDUCATION:  Education details: goals, POC, HEP Person educated: Patient Education method: Explanation, Demonstration, Tactile cues, and Verbal cues Education comprehension: verbalized understanding, returned demonstration, verbal cues required, tactile cues required, and needs further education  HOME EXERCISE PROGRAM: Access Code: [facility] URL: [email or web address] Date: 02/08/2024 Prepared by: Grier Rocher  Exercises - Side Stepping with Counter Support  - 1 x daily - 7 x weekly - 2 sets - 10 reps - 5 hold - Standing Hip Extension  - 1 x daily - 7 x weekly - 2 sets - 10 reps - 5 hold - Standing March with Counter Support  - 1 x daily - 7 x weekly - 2 sets - 10 reps - 5 hold - Standing Hip Abduction with Counter Support  - 1 x daily - 7 x weekly - 2 sets - 10 reps - Seated Hamstring Stretch  - 1 x daily - 7 x weekly - 2 sets - 2 reps - 30 hold - Sit to Stand  - 1 x daily - 7 x weekly - 2 sets - 10 reps - 5 hold - Supine Heel Slide  - 1 x daily - 7 x weekly - 2 sets - 6 reps - Supine Gluteal Sets  - 1 x daily - 7 x weekly - 2 sets - 10 reps - Supine Bridge  - 1 x daily - 7 x weekly - 3 sets - 5 reps - 2-3sec hold - Clamshell  - 1 x daily - 7 x weekly - 3 sets - 10 reps - Supine Short Arc Quad  - 1 x daily - 7 x weekly - 3 sets - 15 reps - Small Range Straight Leg Raise  - 1 x daily - 7 x weekly - 3 sets - 5 reps  ASSESSMENT: CLINICAL IMPRESSION:    Treatment focused on reassesing goals for recert visit. Despite the fact that she has not been back to PT since her trip to [place]- she actually made some progress with mobility and strength. Pt demonstrates improved LE  evidenced by decreased time on 5x STS, improved endurance and activity tolerance with increased 6 min walk test to 950 feet from 819ft. She continues to make steady progress with improved gait speed and subjectively improving with self perceived LE function as seen on the LEFS with improvement up to 29%.  Patient's condition has the potential to improve in response to therapy. Maximum improvement is yet to be obtained. The anticipated improvement is attainable and reasonable in a generally predictable time. Pt understanding and will continue to benefit from skilled therapy to address remaining deficits in order to improve overall QoL and return to PLOF.      OBJECTIVE IMPAIRMENTS: Abnormal gait, decreased activity tolerance, decreased balance, decreased coordination, decreased endurance, decreased mobility, difficulty walking, decreased ROM, decreased strength, hypomobility, increased edema, impaired perceived functional ability, impaired flexibility, improper body mechanics, postural dysfunction, and pain.   ACTIVITY LIMITATIONS:  carrying, lifting, bending, sitting, standing, squatting, sleeping, stairs, transfers, bed mobility, bathing, toileting, dressing, reach over head, hygiene/grooming, locomotion level, and caring for others  PARTICIPATION LIMITATIONS: meal prep, cleaning, laundry, interpersonal relationship, driving, shopping, community activity, and yard work  PERSONAL FACTORS: Age, Past/current experiences, Time since onset of injury/illness/exacerbation, and 3+ comorbidities: blood coagulation disorder, PTSD, anxiety, cervical radiculopathy dypsnea, polyarthralgia, Raynaud disease, s/p closure of ileostomy, Hashimoto thyroid, endometrial cancer, menopause, endometrial  cancer, lymphedema  are also affecting patient's functional outcome.   REHAB POTENTIAL: Good  CLINICAL DECISION MAKING: Evolving/moderate complexity  EVALUATION COMPLEXITY: Moderate   GOALS: Goals reviewed with patient? Yes  SHORT TERM GOALS: Target date: 01/19/2024  Patient will be independent in home exercise program to improve strength/mobility for better functional independence with ADLs. Baseline:1/30: HEP given 3/12: pt reports completing 3-4 days a week depending on soreness.  Goal status:  MET  LONG TERM GOALS: Target date: 06/04/2024  Patient (> 51 years old) will complete five times sit to stand test in < 10 seconds indicating an increased LE strength and improved balance. Baseline: 1/30: 29 seconds 3/12: 19.41 03/12/2024= 17.44 seconds- without UE support Goal status: PROGRESSING  2.  Patient will increase six minute walk test distance to >1000 for progression to community ambulator and improve gait ability Baseline: 430 ft in 5 min and required seated rest to end test due to fatigue, uses SPC 829ft with SPC and no standing or seated rest break; 03/12/2024= 950 feet with SPC Goal status: in progress   3.  Patient will increase 10 meter walk test to >1.40m/s as to improve gait speed for better community ambulation and to reduce fall risk. Baseline: 1/30: 15 seconds with SPC  Without SPC: 10.63 and 10.38 speed: 0.95 m/s; with SPC 10.98 and 11.18; 03/12/2024= 1.0 m/s  Goal status: PROGRESSING  4.  Patient will increase lower extremity functional scale to >60/80 to demonstrate improved functional mobility and increased tolerance with ADLs.  Baseline: 1/30: 5/80 3/12: 20/80; 03/12/2024= 29/80 Goal status: Progressing  PLAN:  PT FREQUENCY: 1-2x/week PT DURATION: 12 weeks PLANNED INTERVENTIONS: 97164- PT Re-evaluation, 97110-Therapeutic exercises, 97530- Therapeutic activity, 97112- Neuromuscular re-education, 97535- Self Care, 08657- Manual therapy, 97116- Gait training, [phone]-  Splinting, 97014- Electrical stimulation (unattended), [phone]- Electrical stimulation (manual), 97035- Ultrasound, 41324- Traction (mechanical), Patient/Family education, Balance training, Stair training, Taping, Joint mobilization, Joint manipulation, Spinal manipulation, Spinal mobilization, Manual lymph drainage, Scar mobilization, Compression bandaging, Vestibular training, Visual/preceptual remediation/compensation, DME instructions, Cryotherapy, and Moist heat  PLAN FOR NEXT SESSION:   Dynamic gait and  R hip strength and ROM,    Louis Meckel, PT Physical Therapist - [facility]  [facility]  4:54 PM 03/12/24

## 2024-03-12 NOTE — Therapy (Signed)
 OUTPATIENT OCCUPATIONAL THERAPY TREATMENT NOTE AND PROGRESS REPORT  LOWER EXTREMITY LYMPHEDEMA  Patient Name: Elizabeth Martin MRN: [id number] DOB:[birth year], 63 y.o., female Today's Date: 03/12/2024  REPORTING PERIOD: 01/11/24 - 02/14/24  END OF SESSION:  Lymphedema Episode 2   OT End of Session - 03/12/24 1417     Visit Number 21    Number of Visits 36    Date for OT Re-Evaluation 04/16/24    OT Start Time 0210    OT Stop Time 0310    OT Time Calculation (min) 60 min    Activity Tolerance Patient tolerated treatment well;No increased pain    Behavior During Therapy WFL for tasks assessed/performed              Past Medical History:  Diagnosis Date   Anxiety    Asthma    Autoimmune disease (HCC)    Cancer (HCC)    Endometrial lymph node removal (30)   Corneal abrasion, right 05/24/2022   Depression    GERD (gastroesophageal reflux disease)    H/O blood clots    upper rt groin and behind both knees   History of hiatal hernia    Hypertension    Lymphedema    right leg   Morphea scleroderma    PONV (postoperative nausea and vomiting)    states gets violently ill   Past Surgical History:  Procedure Laterality Date   BASAL CELL CARCINOMA EXCISION Left    CHOLECYSTECTOMY  2008   FLEXIBLE SIGMOIDOSCOPY N/A 05/19/2022   Procedure: FLEXIBLE SIGMOIDOSCOPY;  Surgeon: Toledo, Boykin Nearing, MD;  Location: [facility] ENDOSCOPY;  Service: Gastroenterology;  Laterality: N/A;   HERNIA REPAIR  2004   ILEOSTOMY CLOSURE N/A 08/24/2022   Procedure: ILEOSTOMY TAKEDOWN, open loop with Lynden Oxford, PA-C to assist;  Surgeon: Henrene Dodge, MD;  Location: [facility] ORS;  Service: General;  Laterality: N/A;   IVC FILTER INSERTION N/A 11/09/2023   Procedure: IVC FILTER INSERTION;  Surgeon: Annice Needy, MD;  Location: [facility] INVASIVE CV LAB;  Service: Cardiovascular;  Laterality: N/A;   IVC FILTER REMOVAL N/A 01/02/2024   Procedure: IVC FILTER REMOVAL;  Surgeon: Annice Needy, MD;  Location: [facility]  INVASIVE CV LAB;  Service: Cardiovascular;  Laterality: N/A;   PLANTAR FASCIECTOMY     ROBOTIC ASSISTED TOTAL HYSTERECTOMY WITH BILATERAL SALPINGO OOPHERECTOMY  02/14/2012   ROTATOR CUFF REPAIR Left 03/24/2022   TONSILLECTOMY     TOTAL HIP ARTHROPLASTY Right 11/14/2023   Procedure: TOTAL HIP ARTHROPLASTY - posterior;  Surgeon: Reinaldo Berber, MD;  Location: [facility] ORS;  Service: Orthopedics;  Laterality: Right;   XI ROBOTIC ASSISTED LOWER ANTERIOR RESECTION N/A 05/24/2022   Procedure: XI ROBOTIC ASSISTED LOWER ANTERIOR RESECTION;  Surgeon: Henrene Dodge, MD;  Location: [facility] ORS;  Service: General;  Laterality: N/A;   Patient Active Problem List   Diagnosis Date Noted   Constipation 01/10/2024   Nausea & vomiting 01/10/2024   S/P total right hip arthroplasty 11/14/2023   Blood coagulation disorder (HCC) 11/10/2023   PTSD (post-traumatic stress disorder) 11/10/2023   Severe episode of recurrent major depressive disorder, without psychotic features (HCC) 11/10/2023   GAD (generalized anxiety disorder) 11/10/2023   High risk medication use 11/10/2023   Anxiety and depression 03/22/2023   Cervical radiculopathy 03/16/2023   Colon cancer screening 03/15/2023   Chronic idiopathic constipation 03/15/2023   Dyspnea 03/07/2023   [facility] spotted fever 03/07/2023   Strain of gastrocnemius tendon 03/07/2023   Lower abdominal pain 03/07/2023   Pain in pelvis  03/07/2023   Small bowel obstruction (HCC) 02/11/2023   Raynaud disease 02/11/2023   Polyarthralgia 02/11/2023   Edema 01/03/2023   Diverticular disease 12/10/2022   Hot flashes 12/10/2022   Pain of right lower extremity 12/10/2022   Paresthesia of lower extremity 12/10/2022   Primary insomnia 12/10/2022   Thyroid nodule 12/10/2022   Multiple joint pain 12/10/2022   Basal cell carcinoma of skin 12/10/2022   Acute bilateral low back pain without sciatica 11/10/2022   Chronic lower back pain 09/05/2022   S/P closure of ileostomy  08/24/2022   Ileostomy in place [facility])    Morphea 07/13/2022   Colonic stricture (HCC) 05/19/2022   Large bowel obstruction (HCC) 05/19/2022   Hypokalemia 05/18/2022   Abdominal pain 05/18/2022   Osteoarthritis of left knee 02/24/2022   Mixed connective tissue disease (HCC) 02/17/2022   Adhesive capsulitis of left shoulder 01/27/2022   Glenohumeral arthritis, left 01/27/2022   Microscopic hematuria 01/12/2022   Otorrhagia of right ear 12/10/2021   Elevated testosterone level in female 11/04/2021   Anti-TPO antibodies present 10/20/2021   Rash 10/13/2021   Hashimoto thyroiditis, 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   Hashimoto's disease 03/23/2021   Exposure to severe acute respiratory syndrome coronavirus 2 (SARS-CoV-2) 12/19/2020   Migraine without aura and without status migrainosus, not intractable 08/19/2020   Myofascial pain 07/30/2020   Neck pain 07/30/2020   Upper back pain 07/30/2020   Cervical facet joint syndrome 07/30/2020   Hair loss 07/14/2020   Edema of lower extremity 07/11/2020   Adenomatous colon polyp 02/26/2020   Gastroesophageal reflux disease 02/15/2020   Diarrhea 02/15/2020   Sleep disorder 01/16/2020   Hyperlipidemia 07/11/2019   Right shoulder pain 04/04/2019   Pre-operative clearance 12/13/2018   Endometrial cancer (HCC) 04/05/2018   Patellofemoral pain syndrome of right knee 03/20/2018   Gastroenteritis 12/07/2017   Fatigue 11/16/2017   Morbid obesity (HCC) 08/11/2017   Stucco keratoses 08/11/2017   Essential hypertension 08/09/2017   History of endometrial cancer 08/09/2017   Lymphedema 08/09/2017   Menopause 08/09/2017   Malignant neoplastic disease (HCC) 04/07/2017   Cellulitis 09/18/2016   Benign hypertension 06/02/2016   Asthma 06/04/1969    PCP: Pennie Banter, MD  REFERRING PROVIDER: Pennie Banter, MD  REFERRING DIAG: I89.0   THERAPY DIAG:  Lymphedema, not elsewhere classified  Rationale for Evaluation and Treatment:  Rehabilitation  ONSET DATE: 2013  (Cancer-related, endometrial 2013)  SUBJECTIVE:                                                                                                                                                                                           SUBJECTIVE STATEMENT:Patient  returns to OT for lymphedema care after vacation trip with her spouse to [place]. She completed PT just before our session. Pt was last seen 3/12. Pt reports she was very active while on vacation , and she wore some form of compression daily. Pt endorses RLE and R hip pain , but does not rate it numerically.    PERTINENT HISTORY: Asthma, Autoimmune Disease (Connective Tissue Disease), Endometrial Ca w/ LND ( 30 bilateral pelvic and periaortic LN), adjuvant chemotherapy and XRT,   H/O blood clots, RLE/RLQ lymphedema, Robotic assisted total hysterectomy w/ bilateral oophorectomy L Rotator Cuff repair, S/P ileostomy 2/2 colon stricture 05/2022; s/p R hip arthroplasty 11/11/23.   PAIN:  Are you having pain? YES. See subjective; not rated /10 Pain location: RLE, R hip, cervical spine Pain description: sore, aching, heavy, full, tight Aggravating factors: standing, walking, extended dependent sitting Relieving factors: elevation, movement  PRECAUTIONS: Fall and Other: LYMPHEDEMA  WEIGHT BEARING RESTRICTIONS: Yes 5# lifting restriction- shoulder  FALLS:  Has patient fallen in last 6 months? No  LIVING ENVIRONMENT: Lives with: lives with their spouse Lives in: House/apartment Stairs: No;  Has following equipment at home: Single point cane, Environmental consultant - 2 wheeled, Environmental consultant - 4 wheeled, and Wheelchair (manual)  OCCUPATION: retired Control and instrumentation engineer: gardening, hiking, biking, dancing- unable to participate in any of these due to pain and swelling  HAND DOMINANCE: right   PRIOR LEVEL OF FUNCTION: Independent with basic ADLs, Independent with household mobility without device, Independent with community  mobility without device, Requires assistive device for independence, Needs assistance with homemaking, Needs assistance with transfers, and Leisure: decreased  social participation for leisure pursuits due to impaired mobility and pain  PATIENT GOALS:  Be able to move freely without pain Reduce limb volume to be able to lift limb for basic daily ADLs and functional ambulation Reduce limb volume to increase ability to perform lower body dressing and bathing and grooming Reduce limb to increase body image  OBJECTIVE: Note: Objective measures were completed at Evaluation unless otherwise noted.  COGNITION:  Overall cognitive status: Within functional limits for tasks assessed   OBSERVATIONS / OTHER ASSESSMENTS:   POSTURE: WFL  LE ROM: WFL, but Limited mildly at R knee and ankle due to girth, skin approximation, and joint pain  LE MMT: WFL  Mild, Stage  II, Bilateral Lower Extremity Lymphedema 2/2 CVI and Obesity  Skin  Description Hyper-Keratosis Peau d' Orange Shiny Tight Fibrotic/ Indurated Fatty Doughy Spongy/ boggy   x x x x R>L Severe morphea at  R groin   x   Skin dry Flaky WNL Macerated   mildly sclerotic     Color Redness Varicosities Blanching Hemosiderin Stain Mottled   x x x   x   Odor Malodorous Yeast Fungal infection  WNL      x   Temperature Warm Cool wnl    x     Pitting Edema   1+ 2+ 3+ 4+ Non-pitting         x   Girth Symmetrical Asymmetrical                   Distribution    R>L RLE toes to groin    Stemmer Sign Positive Negative   +    Lymphorrhea History Of:  Present Absent     x    Wounds History Of Present Absent Venous Arterial Pressure Sheer     x        Signs of Infection  Redness Warmth Erythema Acute Swelling Drainage Borders                    Sensation Light Touch Deep pressure Hypersensitivity   In tact Impaired In tact Impaired Absent Impaired   x  x  x     Nails WNL   Fungus nail dystrophy   x     Hair Growth  Symmetrical Asymmetrical    R>L   Skin Creases Base of toes  Ankles   Base of Fingers knees       Abdominal pannus Thigh Lobules  Face/neck   x x  x        BLE COMPARATIVE LIMB VOLUMETRICS 10/10/23  LANDMARK RIGHT  10/10/23  R LEG (A-D) 5466.5 ml  R THIGH (E-G) 9186.6 ml  R FULL LIMB (A-G) 14653.1 ml  Limb Volume differential (LVD)  LVD for LEG = 44.1%, R>L LVD for THIGH= 33.98%, R>L LVD for FULL lower extremity 37.8%, R>L  Volume change since last 08/11/22 R LEG is INCREASED in volume by 59.6%. L THIGH is INCREASED by 38 %, and RLE full limb is INCREASED by 45.38%.  Volume change overall V  (Blank rows = not tested)  LANDMARK LEFT  10/12/23  L LEG (A-D) 3053.6 ml  L THIGH (E-G) 6064.8 ml  L  FULL LIMB (A-G) 9118.4 ml  Limb Volume differential (LVD)  %  Volume change since initial %  Volume change overall %  (Blank rows = not tested)   10 th VISIT PROGRESS NOTE RLE COMPARATIVE LIMB VOLUMETRICS 01/04/24: Deferred until next visit by Pt request.  TBA next session  LANDMARK RIGHT    R LEG (A-D) TBA  R THIGH (E-G) TBA  R FULL LIMB (A-G) TBA  Limb Volume differential (LVD)    Volume change since last 08/11/22 TBA  Volume change overall TBA  (Blank rows = not tested)  20 th VISIT PROGRESS NOTE RLE COMPARATIVE LIMB VOLUMETRICS TBA next session  LANDMARK RIGHT    R LEG (A-D) %  R THIGH (E-G) %  R FULL LIMB (A-G) %  Limb Volume differential (LVD)    Volume change since last measured on 10/12/23 %  Volume change since commencing OT for CDT %   ENDOMETRIAL CA-related LYMPHEDEMA Hx:  SURGERY TYPE/DATE: 2013 NUMBER OF LYMPH NODES REMOVED: 30 by report- bilateral pelvic and periaortic CHEMOTHERAPY: yes RADIATION:yes INFECTIONS: no hx cellulitis GAIT: Distance walked: >500 ft Assistive device utilized: single point cane Level of assistance: Modified independence- extra time Comments: R limp  LYMPHEDEMA LIFE IMPACT SCALE (LLIS): Intake 10/12/23 75% (The extent to which  lymphedema related problems impacted your life over the past week)  FOTO (functional outcome measure):10/10/23 INTAKE: 36% No Out take score. FOTO assessment discontinued by clinic.  PATIENT EDUCATION:  Education details: Continued Pt/ CG edu for lymphedema self care home program throughout session. Topics include outcome of comparative limb volumetrics- starting limb volume differentials (LVDs), technology and gradient techniques used for short stretch, multilayer compression wrapping, simple self-MLD, therapeutic lymphatic pumping exercises, skin/nail care, LE precautions,. compression garment recommendations and specifications, wear and care schedule and compression garment donning / doffing w assistive devices. Discussed progress towards all OT goals since commencing CDT. All questions answered to the Pt's satisfaction. Good return. Person educated: Patient Education method: Explanation, Demonstration, and Handouts Education comprehension: verbalized understanding, returned demonstration, and needs further education  LE SELF-CARE HOME  PROGRAM: Simple self-MLD/daily to affected quadrant and body part At least 2 x  daily BLE Lymphatic Pumping There ex 1 set of 10 reps each, in order, bilaterally Daily skin care to affected body part to limit infection risk and increase skin excursion Compression Bandaging Intensive stage compression: multilayer short stretch wraps with gradient techniques. One limb at a time. Length patient dependent. Self-management Phase: Appropriate daytime compression garment and hours-of-sleep device  Compression garments: Custom-made gradient compression garments and hours-of-sleep devices are medically necessary because they are uniquely sized and shaped to fit the exact dimensions of the affected extremities and to provide accurate and consistent gradient compression and containment, essential  for optimally managing chronic, progressive lymphedema. The convoluted HOS  devices are medically necessary to facilitate increased lymphatic circulation and limit fibrosis formation when sleeping. Multiple custom compression garments are needed for optimal hygiene to limit infection risk. Custom compression garments should be replaced q 3-6 months When worn consistently for optimal lipo-lymphedema self-management over time.  Pt will benefit from [id number]- Circaid reduction kit whole leg to increase independence with lymphedema self care, to  reduce limb volume  for body symmetry and balance, and to limit infection risk and progression.  ASSESSMENT:  CLINICAL IMPRESSION:  RLE very swollen today after being away 2 weeks. Progress is slow. Lymphedema management  with combination of progressive, secondary lymphedema with connective tissue disease is very stubborn and challenging.  Pt tolerated MLD to RLE/RLQ without increased pain. We also used deep fibrosis techniques to distal leg and medial malleolus with palpable softening in tissue density after session.Applied multilayer compression wraps as established using gradient techniques.  Cont as per POC.  10/10/23 Lymphedema Episode 2, Initial OT Evaluation : Elizabeth Martin is a 89 y a female presenting with chronic, progressive, moderate, Stage II, RLE/RLQ cancer -related lymphedema with onset 11 years ago after Rx for endometrial cancer. Pt is well known to this therapist as she has successfully undergone Complete Decongestive Therapy (CDT)  this clinic bin the past. Pt reports recent exacerbation of RLE swelling worsened as inflammation in L hip has worsened over time.  Pt has a hip replacement scheduled in late December here it [facility]. RE limb volumetrics today reveal that R LEG volume is dramatically increased by 59.65 since last visit. R LEG VOLUME is INCREASED in volume by 59.6%. L THIGH is INCREASED by 38 %, and RLE full limb is INCREASED by 45.38%. Pt presents with LE-related skin changes. Prolonged inflammation in the R hip joint  has likely overloaded  already RLE/RLQ lymphatics.  RLE/RLQ lymphedema limits Pt's ability to perform basic and instrumental ADLs, including functional ambulation, mobility and transfers, grooming , lower body bathing and dressing, skin inspection and skin care. Pt has difficulty  reaching her lower legs and feet to apply compression wraps and don/doff        compression stockings. LE limits ability to P[perform instrumental ADLs, including driving, yard work and home management activities. Lymphedema limits her ability to participate in leisure pursuits and productive activities, and it negatively impacts body image, life roles and quality of life.   Pt will benefit from an Intensive and follow along course of lymphedema. CDT will consist of Manual Lymphatic gainage   (MLD), skin care, therapeutic exercise and compression, wraps initially then garments. Pt will need assistance throughout CDT   for applying compression wraps due to limited hip AROM and decreased skin flexibility. Without skilled Occupational Therapy for lymphedema care, lymphedema will progress and further functional decline is expected.   In prep for upcoming hip replacement OT will educate Pt re  assistive devices, including tub transfer bench and elevated toilet seat, in keeping with hip precautions.  OBJECTIVE IMPAIRMENTS: Abnormal gait, decreased activity tolerance, decreased balance, decreased knowledge of use of DME, decreased mobility, difficulty walking, decreased ROM, decreased strength, increased edema, decreased skin flexibility, increased fascial restrictions, impaired sensation, pain, and chronic, progressive LLE/LLQ lymphatic swelling and associated pain.   ADL LIMITATIONS: carrying, lifting, bending, sitting, standing, squatting, sleeping, stairs, transfers, bed mobility, bathing, dressing, hygiene/grooming, and productive activities, leisure pursuits, social participation, body image  driving, shopping, housework, yard  work, cooking, meal prep  PERSONAL FACTORS: Past/current experiences and 3+ comorbidities: Morphea Scleroderma, Mixed connective tissue disease, and osteoarthritis  are also affecting patient's functional outcome.   REHAB POTENTIAL: Good  EVALUATION COMPLEXITY: Moderate  GOALS: Goals reviewed with patient? Yes  SHORT TERM GOALS: Target date: 4th OT Rx visit   Pt will demonstrate understanding of lymphedema precautions and prevention strategies with modified independence using a printed reference to identify at least 5 precautions and discussing how s/he may implement them into daily life to reduce risk of progression with extra time.  Baseline:Max A Goal status: GOAL MET  2.  Pt will be able to apply multilayer, knee length, gradient, compression wraps to one leg at a time with modified assistance (extra time and assistive device/s) to decrease limb volume, to limit infection risk, and to limit lymphedema progression.  Baseline: Dependent Goal status: GOAL MET  LONG TERM GOALS: Target date: 01/09/24 (12 weeks)  Given this patient's Intake score 36% on the functional outcomes FOTO tool, patient will experience an increase in function of 3 points to improve basic and instrumental ADLs performance, including lymphedema self-care.  Baseline: Max A Goal status: DEFERRED as Janyth Contes has been discontinued  2.  Given this patient's Intake score of  75% on the Lymphedema Life Impact Scale (LLIS), patient will experience a reduction of at least 5 points in her perceived level of functional impairment resulting from lymphedema to improve functional performance and quality of life (QOL). Baseline: % Goal status: PROGRESSING  3.  Pt will achieve at least a 10% volume reduction in full, RLE limb volume to return limb to typical size and shape, to limit infection risk and LE progression, to decrease pain, to improve function. Baseline: Dependent Goal status:PROGRESSING.  Volumetrics deferred until next  week to focus on manual therapy and relief of pain/discomfort. To date by visual assessment I would estimate no more than a 10% volume reduction. DENSITY IN THIGH HAS REDUCED only SLIGHTLY. Morphea at R groin continues to present a significant obstacle to limb volume decongestion and fibrosis reduction.  Progress is slow. Lymphedema management  with combination of progressive, secondary lymphedema with connective tissue disease is very stubborn and challenging.   4.  Pt will obtain proper compression garments/devices and achieve modified independence (extra time + assistive devices) with donning/doffing to optimize limb volume reductions and limit LE  progression over time. Baseline:  Goal status: 01/04/24: PARTIALLY MET; In an effort to reduce burden of care on her spouse, Pt obtained adjustable, Velcro style R full leg, Circaid leg reduction kit for use s/p THA, but this proved not effective for controlling swelling, and were not easier to don and doff more independently. We've resumed wrapping until she is able to fit back into custom compression garments. 02/14/24: Ongoing  5.  During Intensive phase CDT , with modified independence, Pt will achieve at least 85% compliance with all lymphedema self-care home program components, including daily skin care, compression  wraps and /or garments, simple self MLD and lymphatic pumping therex to habituate LE self care protocol  into ADLs for optimal LE self-management over time. Baseline: Dependent Goal status: 02/14/24: GOAL MET and EXCEEDED PLAN:  OT FREQUENCY: 2x/week  OT DURATION: 12 weeks and PRN  PLANNED INTERVENTIONS: Complete Decongestive Therapy (Intensive and supported Self-Management Phases), 97110-Therapeutic exercises, 97530- Therapeutic activity, 97535- Self Care, 16109- Manual therapy, Patient/Family education, Taping, Manual lymph drainage, Scar mobilization, Compression bandaging, DME instructions, and skin care to reduce infection risk  throughout manual therapy. Fit with replacement compression garments ASAP  PLAN FOR NEXT SESSION:  Complete RLE comparative limb volumetrics for progress note update Cont RLE/RLQ MLD Cont multilayer compression wrapping    Loel Dubonnet, MS, OTR/L, CLT-LANA 03/12/24 3:16 PM  03/12/2024, 3:16 PM

## 2024-03-13 ENCOUNTER — Other Ambulatory Visit: Payer: Self-pay | Admitting: Psychiatry

## 2024-03-13 DIAGNOSIS — F332 Major depressive disorder, recurrent severe without psychotic features: Secondary | ICD-10-CM

## 2024-03-14 ENCOUNTER — Ambulatory Visit: Payer: BC Managed Care – PPO | Admitting: Occupational Therapy

## 2024-03-14 ENCOUNTER — Ambulatory Visit

## 2024-03-14 DIAGNOSIS — R262 Difficulty in walking, not elsewhere classified: Secondary | ICD-10-CM

## 2024-03-14 DIAGNOSIS — M25551 Pain in right hip: Secondary | ICD-10-CM

## 2024-03-14 DIAGNOSIS — M6281 Muscle weakness (generalized): Secondary | ICD-10-CM

## 2024-03-14 DIAGNOSIS — I890 Lymphedema, not elsewhere classified: Secondary | ICD-10-CM | POA: Diagnosis not present

## 2024-03-14 DIAGNOSIS — R2681 Unsteadiness on feet: Secondary | ICD-10-CM

## 2024-03-14 NOTE — Therapy (Signed)
 OUTPATIENT PHYSICAL THERAPY TREATMENT    Patient Name: Elizabeth Martin MRN: [id number] DOB:03-30-1961, 63 y.o., female Today's Date: 03/14/2024  END OF SESSION:  PT End of Session - 03/14/24 1714     Visit Number 12    Number of Visits 35    Date for PT Re-Evaluation 06/04/24    Authorization Type [employer]    Progress Note Due on Visit 20    PT Start Time 1317    PT Stop Time 1358    PT Time Calculation (min) 41 min    Equipment Utilized During Treatment Gait belt    Activity Tolerance Patient tolerated treatment well;No increased pain;Other (comment)   quick to faitgue in RLE  mm   Behavior During Therapy [facility] for tasks assessed/performed                   Past Medical History:  Diagnosis Date   Anxiety    Asthma    Autoimmune disease (HCC)    Cancer (HCC)    Endometrial lymph node removal (30)   Corneal abrasion, right 05/24/2022   Depression    GERD (gastroesophageal reflux disease)    H/O blood clots    upper rt groin and behind both knees   History of hiatal hernia    Hypertension    Lymphedema    right leg   Morphea scleroderma    PONV (postoperative nausea and vomiting)    states gets violently ill   Past Surgical History:  Procedure Laterality Date   BASAL CELL CARCINOMA EXCISION Left    CHOLECYSTECTOMY  2008   FLEXIBLE SIGMOIDOSCOPY N/A 05/19/2022   Procedure: FLEXIBLE SIGMOIDOSCOPY;  Surgeon: Toledo, Boykin Nearing, MD;  Location: [facility];  Service: Gastroenterology;  Laterality: N/A;   HERNIA REPAIR  2004   ILEOSTOMY CLOSURE N/A 08/24/2022   Procedure: ILEOSTOMY TAKEDOWN, open loop with Lynden Oxford, PA-C to assist;  Surgeon: Henrene Dodge, MD;  Location: [facility] ORS;  Service: General;  Laterality: N/A;   IVC FILTER INSERTION N/A 11/09/2023   Procedure: IVC FILTER INSERTION;  Surgeon: Annice Needy, MD;  Location: [facility];  Service: Cardiovascular;  Laterality: N/A;   IVC FILTER REMOVAL N/A 01/02/2024   Procedure: IVC FILTER  REMOVAL;  Surgeon: Annice Needy, MD;  Location: [facility];  Service: Cardiovascular;  Laterality: N/A;   PLANTAR FASCIECTOMY     ROBOTIC ASSISTED TOTAL HYSTERECTOMY WITH BILATERAL SALPINGO OOPHERECTOMY  02/14/2012   ROTATOR CUFF REPAIR Left 03/24/2022   TONSILLECTOMY     TOTAL HIP ARTHROPLASTY Right 11/14/2023   Procedure: TOTAL HIP ARTHROPLASTY - posterior;  Surgeon: Reinaldo Berber, MD;  Location: [facility] ORS;  Service: Orthopedics;  Laterality: Right;   XI ROBOTIC ASSISTED LOWER ANTERIOR RESECTION N/A 05/24/2022   Procedure: XI ROBOTIC ASSISTED LOWER ANTERIOR RESECTION;  Surgeon: Henrene Dodge, MD;  Location: [facility] ORS;  Service: General;  Laterality: N/A;   Patient Active Problem List   Diagnosis Date Noted   Constipation 01/10/2024   Nausea & vomiting 01/10/2024   S/P total right hip arthroplasty 11/14/2023   Blood coagulation disorder (HCC) 11/10/2023   PTSD (post-traumatic stress disorder) 11/10/2023   Severe episode of recurrent major depressive disorder, without psychotic features (HCC) 11/10/2023   GAD (generalized anxiety disorder) 11/10/2023   High risk medication use 11/10/2023   Anxiety and depression 03/22/2023   Cervical radiculopathy 03/16/2023   Colon cancer screening 03/15/2023   Chronic idiopathic constipation 03/15/2023   Dyspnea 03/07/2023   [facility]  spotted fever 03/07/2023   Strain of gastrocnemius tendon 03/07/2023   Lower abdominal pain 03/07/2023   Pain in pelvis 03/07/2023   Small bowel obstruction (HCC) 02/11/2023   Raynaud disease 02/11/2023   Polyarthralgia 02/11/2023   Edema 01/03/2023   Diverticular disease 12/10/2022   Hot flashes 12/10/2022   Pain of right lower extremity 12/10/2022   Paresthesia of lower extremity 12/10/2022   Primary insomnia 12/10/2022   Thyroid nodule 12/10/2022   Multiple joint pain 12/10/2022   Basal cell carcinoma of skin 12/10/2022   Acute bilateral low back pain without sciatica 11/10/2022   Chronic  lower back pain 09/05/2022   S/P closure of ileostomy 08/24/2022   Ileostomy in place [facility])    Morphea 07/13/2022   Colonic stricture (HCC) 05/19/2022   Large bowel obstruction (HCC) 05/19/2022   Hypokalemia 05/18/2022   Abdominal pain 05/18/2022   Osteoarthritis of left knee 02/24/2022   Mixed connective tissue disease (HCC) 02/17/2022   Adhesive capsulitis of left shoulder 01/27/2022   Glenohumeral arthritis, left 01/27/2022   Microscopic hematuria 01/12/2022   Otorrhagia of right ear 12/10/2021   Elevated testosterone level in female 11/04/2021   Anti-TPO antibodies present 10/20/2021   Rash 10/13/2021   Hashimoto thyroiditis, 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   Hashimoto's disease 03/23/2021   Exposure to severe acute respiratory syndrome coronavirus 2 (SARS-CoV-2) 12/19/2020   Migraine without aura and without status migrainosus, not intractable 08/19/2020   Myofascial pain 07/30/2020   Neck pain 07/30/2020   Upper back pain 07/30/2020   Cervical facet joint syndrome 07/30/2020   Hair loss 07/14/2020   Edema of lower extremity 07/11/2020   Adenomatous colon polyp 02/26/2020   Gastroesophageal reflux disease 02/15/2020   Diarrhea 02/15/2020   Sleep disorder 01/16/2020   Hyperlipidemia 07/11/2019   Right shoulder pain 04/04/2019   Pre-operative clearance 12/13/2018   Endometrial cancer (HCC) 04/05/2018   Patellofemoral pain syndrome of right knee 03/20/2018   Gastroenteritis 12/07/2017   Fatigue 11/16/2017   Morbid obesity (HCC) 08/11/2017   Stucco keratoses 08/11/2017   Essential hypertension 08/09/2017   History of endometrial cancer 08/09/2017   Lymphedema 08/09/2017   Menopause 08/09/2017   Malignant neoplastic disease (HCC) 04/07/2017   Cellulitis 09/18/2016   Benign hypertension 06/02/2016   Asthma 06/04/1969    PCP: Pennie Banter MD   REFERRING PROVIDER: Pennie Banter MD   REFERRING DIAG: s/p R hip replacement  THERAPY DIAG:  Muscle weakness  (generalized)  Pain in right hip  Difficulty in walking, not elsewhere classified  Unsteadiness on feet  Rationale for Evaluation and Treatment: Rehabilitation  ONSET DATE: 11/14/23  SUBJECTIVE:   SUBJECTIVE STATEMENT:  Pt reports R hip pain is 2-3/10. She reports exercises going ok at home. She reports no stumbles/falls. RLE has been more swollen since trip; no other updates.   PERTINENT HISTORY: s/p R hip arthropaslasty 11/14/23 with Dr. Audelia Acton posterior approach, blood coagulation disorder, PTSD, anxiety, cervical radiculopathy dypsnea, polyarthralgia, Raynaud disease, s/p closure of ileostomy, Hashimoto thyroid, endometrial cancer, menopause, endometrial cancer, lymphedema. Patient had home health PT prior to PT here.   PAIN:  Are you having pain?  4/10 Rt hip joint pain, posterior portion of hip.    PRECAUTIONS: Other: recovering from hip sx  RED FLAGS: None   WEIGHT BEARING RESTRICTIONS: No  FALLS:  Has patient fallen in last 6 months? Yes. Number of falls 2  LIVING ENVIRONMENT: Lives with: lives with their spouse Lives in: House/apartment Stairs: No Has following equipment at home: Single point  cane  OCCUPATION: retired,   PLOF: Independent  PATIENT GOALS: return to hiking, working out, gardening.   NEXT MD VISIT: March 2025   OBJECTIVE:  Note: Objective measures were completed at Evaluation unless otherwise noted.  DIAGNOSTIC FINDINGS:   IMPRESSION: Right hip replacement.  No visible complicating feature.  PATIENT SURVEYS:  LEFS 5/80  COGNITION: Overall cognitive status: Within functional limits for tasks assessed    MUSCLE LENGTH: Hamstrings: limited bilaterally with R>L   LOWER EXTREMITY ROM:  Passive ROM Right eval Left eval  Hip flexion 48 flexion PROM supine   Hip extension    Hip abduction 5 degrees AROM     (Blank rows = not tested)  LOWER EXTREMITY MMT:  MMT Right eval Left eval  Hip flexion 2+ 5  Hip extension  5  Hip  abduction 2+* painful 5  Hip adduction 2+ 5  Hip internal rotation    Hip external rotation    Knee flexion 3 5  Knee extension 3 5  Ankle dorsiflexion 3 5  Ankle plantarflexion 3 5   (Blank rows = not tested)    FUNCTIONAL TESTS:  5 times sit to stand: 29 6 minute walk test:  10 meter walk test: 15 seconds with SPC   GAIT: Distance walked: 60 ft Assistive device utilized: Single point cane Level of assistance: CGA Comments: antalgic gait pattern with decreased stance time RLE. Slight vaulting pattern.  Stair negotiation: -step to pattern lead with LLE, one hand on cane one hand on rail ascending. Descending step to pattern down with RLE.                                                                                                                               TREATMENT DATE: 03/14/24   TA to improve BLE ROM and allow improved independence with ADLs and community mobility.    Lateral step over half bolsters x8 bil  Forward/bckwd step over half bolster x10 each LE - tightness in R hip, slightly difficult  High knee march 10x each LE   Seated: Hip circles (with leg straight), 20-30 sec bouts CW/CCW x2 each direction LAQ, 2x10 each LE - very difficult today RLE   Standing hip abduction 2x10   Supine: on mat table  SAQ 4# AW, 2x12 SLR AROM on the RLE x 8, 2x4 (limited AROM), AROM on the LLE 2x 10  Bridge 1x15, 1x10  Heel slide RLE x 10 with pillow case under foot.   Clam shell on the RLE, 1x10, 1x6 Hip abduction x 5 AAROM on the RLE - limited by increased pain  NMR: Instructed in tandem standing- hold up to 2x30 sec each LE Instructed in single leg standing-2x30 sec LLE, x 30 sec RLE - very tough  on RLE     PATIENT EDUCATION:  Education details: exercise technique Person educated: Patient Education method: Explanation, Demonstration, Tactile cues, and Verbal cues Education comprehension: verbalized understanding, returned demonstration,  verbal cues  required, tactile cues required, and needs further education  HOME EXERCISE PROGRAM: Access Code: [facility] URL: [email or web address] Date: 02/08/2024 Prepared by: Grier Rocher  Exercises - Side Stepping with Counter Support  - 1 x daily - 7 x weekly - 2 sets - 10 reps - 5 hold - Standing Hip Extension  - 1 x daily - 7 x weekly - 2 sets - 10 reps - 5 hold - Standing March with Counter Support  - 1 x daily - 7 x weekly - 2 sets - 10 reps - 5 hold - Standing Hip Abduction with Counter Support  - 1 x daily - 7 x weekly - 2 sets - 10 reps - Seated Hamstring Stretch  - 1 x daily - 7 x weekly - 2 sets - 2 reps - 30 hold - Sit to Stand  - 1 x daily - 7 x weekly - 2 sets - 10 reps - 5 hold - Supine Heel Slide  - 1 x daily - 7 x weekly - 2 sets - 6 reps - Supine Gluteal Sets  - 1 x daily - 7 x weekly - 2 sets - 10 reps - Supine Bridge  - 1 x daily - 7 x weekly - 3 sets - 5 reps - 2-3sec hold - Clamshell  - 1 x daily - 7 x weekly - 3 sets - 10 reps - Supine Short Arc Quad  - 1 x daily - 7 x weekly - 3 sets - 15 reps - Small Range Straight Leg Raise  - 1 x daily - 7 x weekly - 3 sets - 5 reps  ASSESSMENT: CLINICAL IMPRESSION:   PT continued plan as laid out in recent treatment sessions. Overall, pt more limited today by RLE swelling since trip with RLE fatiguing more quickly. She is currently working with lymphedema therapist to address this. Will try to progress interventions as pt able. The pt will benefit from further skilled PT to improve pain, gait, strength, balance and mobility.    OBJECTIVE IMPAIRMENTS: Abnormal gait, decreased activity tolerance, decreased balance, decreased coordination, decreased endurance, decreased mobility, difficulty walking, decreased ROM, decreased strength, hypomobility, increased edema, impaired perceived functional ability, impaired flexibility, improper body mechanics, postural dysfunction, and pain.   ACTIVITY LIMITATIONS: carrying, lifting,  bending, sitting, standing, squatting, sleeping, stairs, transfers, bed mobility, bathing, toileting, dressing, reach over head, hygiene/grooming, locomotion level, and caring for others  PARTICIPATION LIMITATIONS: meal prep, cleaning, laundry, interpersonal relationship, driving, shopping, community activity, and yard work  PERSONAL FACTORS: Age, Past/current experiences, Time since onset of injury/illness/exacerbation, and 3+ comorbidities: blood coagulation disorder, PTSD, anxiety, cervical radiculopathy dypsnea, polyarthralgia, Raynaud disease, s/p closure of ileostomy, Hashimoto thyroid, endometrial cancer, menopause, endometrial cancer, lymphedema  are also affecting patient's functional outcome.   REHAB POTENTIAL: Good  CLINICAL DECISION MAKING: Evolving/moderate complexity  EVALUATION COMPLEXITY: Moderate   GOALS: Goals reviewed with patient? Yes  SHORT TERM GOALS: Target date: 01/19/2024  Patient will be independent in home exercise program to improve strength/mobility for better functional independence with ADLs. Baseline:1/30: HEP given 3/12: pt reports completing 3-4 days a week depending on soreness.  Goal status:  MET  LONG TERM GOALS: Target date: 06/04/2024  Patient (> 83 years old) will complete five times sit to stand test in < 10 seconds indicating an increased LE strength and improved balance. Baseline: 1/30: 29 seconds 3/12: 19.41 03/12/2024= 17.44 seconds- without UE support Goal status: PROGRESSING  2.  Patient will increase six minute walk test  distance to >1000 for progression to community ambulator and improve gait ability Baseline: 430 ft in 5 min and required seated rest to end test due to fatigue, uses SPC 880ft with SPC and no standing or seated rest break; 03/12/2024= 950 feet with SPC Goal status: in progress   3.  Patient will increase 10 meter walk test to >1.28m/s as to improve gait speed for better community ambulation and to reduce fall  risk. Baseline: 1/30: 15 seconds with SPC  Without SPC: 10.63 and 10.38 speed: 0.95 m/s; with SPC 10.98 and 11.18; 03/12/2024= 1.0 m/s  Goal status: PROGRESSING  4.  Patient will increase lower extremity functional scale to >60/80 to demonstrate improved functional mobility and increased tolerance with ADLs.  Baseline: 1/30: 5/80 3/12: 20/80; 03/12/2024= 29/80 Goal status: Progressing  PLAN:  PT FREQUENCY: 1-2x/week PT DURATION: 12 weeks PLANNED INTERVENTIONS: 97164- PT Re-evaluation, 97110-Therapeutic exercises, 97530- Therapeutic activity, 97112- Neuromuscular re-education, 97535- Self Care, 95284- Manual therapy, 97116- Gait training, [phone]- Splinting, 97014- Electrical stimulation (unattended), [phone]- Electrical stimulation (manual), 97035- Ultrasound, 25366- Traction (mechanical), Patient/Family education, Balance training, Stair training, Taping, Joint mobilization, Joint manipulation, Spinal manipulation, Spinal mobilization, Manual lymph drainage, Scar mobilization, Compression bandaging, Vestibular training, Visual/preceptual remediation/compensation, DME instructions, Cryotherapy, and Moist heat  PLAN FOR NEXT SESSION:   Dynamic gait and  R hip strength and ROM,    Temple Pacini PT, DPT  Physical Therapist - [facility]  [facility]  5:15 PM 03/14/24

## 2024-03-14 NOTE — Therapy (Signed)
 OUTPATIENT OCCUPATIONAL THERAPY TREATMENT NOTE  LOWER EXTREMITY LYMPHEDEMA  Patient Name: Elizabeth Martin MRN: [id number] DOB:1961/04/10, 63 y.o., female Today's Date: 03/14/2024  REPORTING PERIOD:   END OF SESSION:  Lymphedema Episode 2   OT End of Session - 03/14/24 1408     Visit Number 21    Number of Visits 36    Date for OT Re-Evaluation 04/16/24    OT Start Time 0205    OT Stop Time 0305    OT Time Calculation (min) 60 min    Activity Tolerance Patient tolerated treatment well;No increased pain    Behavior During Therapy WFL for tasks assessed/performed              Past Medical History:  Diagnosis Date   Anxiety    Asthma    Autoimmune disease (HCC)    Cancer (HCC)    Endometrial lymph node removal (30)   Corneal abrasion, right 05/24/2022   Depression    GERD (gastroesophageal reflux disease)    H/O blood clots    upper rt groin and behind both knees   History of hiatal hernia    Hypertension    Lymphedema    right leg   Morphea scleroderma    PONV (postoperative nausea and vomiting)    states gets violently ill   Past Surgical History:  Procedure Laterality Date   BASAL CELL CARCINOMA EXCISION Left    CHOLECYSTECTOMY  2008   FLEXIBLE SIGMOIDOSCOPY N/A 05/19/2022   Procedure: FLEXIBLE SIGMOIDOSCOPY;  Surgeon: Toledo, Boykin Nearing, MD;  Location: [facility];  Service: Gastroenterology;  Laterality: N/A;   HERNIA REPAIR  2004   ILEOSTOMY CLOSURE N/A 08/24/2022   Procedure: ILEOSTOMY TAKEDOWN, open loop with Lynden Oxford, PA-C to assist;  Surgeon: Henrene Dodge, MD;  Location: [facility];  Service: General;  Laterality: N/A;   IVC FILTER INSERTION N/A 11/09/2023   Procedure: IVC FILTER INSERTION;  Surgeon: Annice Needy, MD;  Location: [facility];  Service: Cardiovascular;  Laterality: N/A;   IVC FILTER REMOVAL N/A 01/02/2024   Procedure: IVC FILTER REMOVAL;  Surgeon: Annice Needy, MD;  Location: [facility];  Service:  Cardiovascular;  Laterality: N/A;   PLANTAR FASCIECTOMY     ROBOTIC ASSISTED TOTAL HYSTERECTOMY WITH BILATERAL SALPINGO OOPHERECTOMY  02/14/2012   ROTATOR CUFF REPAIR Left 03/24/2022   TONSILLECTOMY     TOTAL HIP ARTHROPLASTY Right 11/14/2023   Procedure: TOTAL HIP ARTHROPLASTY - posterior;  Surgeon: Reinaldo Berber, MD;  Location: [facility];  Service: Orthopedics;  Laterality: Right;   XI ROBOTIC ASSISTED LOWER ANTERIOR RESECTION N/A 05/24/2022   Procedure: XI ROBOTIC ASSISTED LOWER ANTERIOR RESECTION;  Surgeon: Henrene Dodge, MD;  Location: [facility];  Service: General;  Laterality: N/A;   Patient Active Problem List   Diagnosis Date Noted   Constipation 01/10/2024   Nausea & vomiting 01/10/2024   S/P total right hip arthroplasty 11/14/2023   Blood coagulation disorder (HCC) 11/10/2023   PTSD (post-traumatic stress disorder) 11/10/2023   Severe episode of recurrent major depressive disorder, without psychotic features (HCC) 11/10/2023   GAD (generalized anxiety disorder) 11/10/2023   High risk medication use 11/10/2023   Anxiety and depression 03/22/2023   Cervical radiculopathy 03/16/2023   Colon cancer screening 03/15/2023   Chronic idiopathic constipation 03/15/2023   Dyspnea 03/07/2023   [facility] spotted fever 03/07/2023   Strain of gastrocnemius tendon 03/07/2023   Lower abdominal pain 03/07/2023   Pain in pelvis 03/07/2023   Small bowel  obstruction (HCC) 02/11/2023   Raynaud disease 02/11/2023   Polyarthralgia 02/11/2023   Edema 01/03/2023   Diverticular disease 12/10/2022   Hot flashes 12/10/2022   Pain of right lower extremity 12/10/2022   Paresthesia of lower extremity 12/10/2022   Primary insomnia 12/10/2022   Thyroid nodule 12/10/2022   Multiple joint pain 12/10/2022   Basal cell carcinoma of skin 12/10/2022   Acute bilateral low back pain without sciatica 11/10/2022   Chronic lower back pain 09/05/2022   S/P closure of ileostomy 08/24/2022   Ileostomy in  [facility])    Morphea 07/13/2022   Colonic stricture (HCC) 05/19/2022   Large bowel obstruction (HCC) 05/19/2022   Hypokalemia 05/18/2022   Abdominal pain 05/18/2022   Osteoarthritis of left knee 02/24/2022   Mixed connective tissue disease (HCC) 02/17/2022   Adhesive capsulitis of left shoulder 01/27/2022   Glenohumeral arthritis, left 01/27/2022   Microscopic hematuria 01/12/2022   Otorrhagia of right ear 12/10/2021   Elevated testosterone level in female 11/04/2021   Anti-TPO antibodies present 10/20/2021   Rash 10/13/2021   Hashimoto thyroiditis, 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   Hashimoto's disease 03/23/2021   Exposure to severe acute respiratory syndrome coronavirus 2 (SARS-CoV-2) 12/19/2020   Migraine without aura and without status migrainosus, not intractable 08/19/2020   Myofascial pain 07/30/2020   Neck pain 07/30/2020   Upper back pain 07/30/2020   Cervical facet joint syndrome 07/30/2020   Hair loss 07/14/2020   Edema of lower extremity 07/11/2020   Adenomatous colon polyp 02/26/2020   Gastroesophageal reflux disease 02/15/2020   Diarrhea 02/15/2020   Sleep disorder 01/16/2020   Hyperlipidemia 07/11/2019   Right shoulder pain 04/04/2019   Pre-operative clearance 12/13/2018   Endometrial cancer (HCC) 04/05/2018   Patellofemoral pain syndrome of right knee 03/20/2018   Gastroenteritis 12/07/2017   Fatigue 11/16/2017   Morbid obesity (HCC) 08/11/2017   Stucco keratoses 08/11/2017   Essential hypertension 08/09/2017   History of endometrial cancer 08/09/2017   Lymphedema 08/09/2017   Menopause 08/09/2017   Malignant neoplastic disease (HCC) 04/07/2017   Cellulitis 09/18/2016   Benign hypertension 06/02/2016   Asthma 06/04/1969    PCP: Pennie Banter, MD  REFERRING PROVIDER: Pennie Banter, MD  REFERRING DIAG: I89.0   THERAPY DIAG:  Lymphedema, not elsewhere classified  Rationale for Evaluation and Treatment: Rehabilitation  ONSET DATE: 2013   (Cancer-related, endometrial 2013)  SUBJECTIVE:                                                                                                                                                                                           SUBJECTIVE STATEMENT:Patient returns to OT for lymphedema  care after vacation trip with her spouse to [place]. She completed PT just before our session. Pt was last seen 3/12. Pt reports she was very active while on vacation , and she wore some form of compression daily. Pt endorses RLE and R hip pain , but does not rate it numerically.    PERTINENT HISTORY: Asthma, Autoimmune Disease (Connective Tissue Disease), Endometrial Ca w/ LND ( 30 bilateral pelvic and periaortic LN), adjuvant chemotherapy and XRT,   H/O blood clots, RLE/RLQ lymphedema, Robotic assisted total hysterectomy w/ bilateral oophorectomy L Rotator Cuff repair, S/P ileostomy 2/2 colon stricture 05/2022; s/p R hip arthroplasty 11/11/23.   PAIN:  Are you having pain? YES. See subjective; not rated /10 Pain location: RLE, R hip, cervical spine Pain description: sore, aching, heavy, full, tight Aggravating factors: standing, walking, extended dependent sitting Relieving factors: elevation, movement  PRECAUTIONS: Fall and Other: LYMPHEDEMA  WEIGHT BEARING RESTRICTIONS: Yes 5# lifting restriction- shoulder  FALLS:  Has patient fallen in last 6 months? No  LIVING ENVIRONMENT: Lives with: lives with their spouse Lives in: House/apartment Stairs: No;  Has following equipment at home: Single point cane, Environmental consultant - 2 wheeled, Environmental consultant - 4 wheeled, and Wheelchair (manual)  OCCUPATION: retired Control and instrumentation engineer: gardening, hiking, biking, dancing- unable to participate in any of these due to pain and swelling  HAND DOMINANCE: right   PRIOR LEVEL OF FUNCTION: Independent with basic ADLs, Independent with household mobility without device, Independent with community mobility without device, Requires  assistive device for independence, Needs assistance with homemaking, Needs assistance with transfers, and Leisure: decreased  social participation for leisure pursuits due to impaired mobility and pain  PATIENT GOALS:  Be able to move freely without pain Reduce limb volume to be able to lift limb for basic daily ADLs and functional ambulation Reduce limb volume to increase ability to perform lower body dressing and bathing and grooming Reduce limb to increase body image  OBJECTIVE: Note: Objective measures were completed at Evaluation unless otherwise noted.  COGNITION:  Overall cognitive status: Within functional limits for tasks assessed   OBSERVATIONS / OTHER ASSESSMENTS:   POSTURE: WFL  LE ROM: WFL, but Limited mildly at R knee and ankle due to girth, skin approximation, and joint pain  LE MMT: WFL  Mild, Stage  II, Bilateral Lower Extremity Lymphedema 2/2 CVI and Obesity  Skin  Description Hyper-Keratosis Peau d' Orange Shiny Tight Fibrotic/ Indurated Fatty Doughy Spongy/ boggy   x x x x R>L Severe morphea at  R groin   x   Skin dry Flaky WNL Macerated   mildly sclerotic     Color Redness Varicosities Blanching Hemosiderin Stain Mottled   x x x   x   Odor Malodorous Yeast Fungal infection  WNL      x   Temperature Warm Cool wnl    x     Pitting Edema   1+ 2+ 3+ 4+ Non-pitting         x   Girth Symmetrical Asymmetrical                   Distribution    R>L RLE toes to groin    Stemmer Sign Positive Negative   +    Lymphorrhea History Of:  Present Absent     x    Wounds History Of Present Absent Venous Arterial Pressure Sheer     x        Signs of Infection Redness Warmth Erythema Acute Swelling  Drainage Borders                    Sensation Light Touch Deep pressure Hypersensitivity   In tact Impaired In tact Impaired Absent Impaired   x  x  x     Nails WNL   Fungus nail dystrophy   x     Hair Growth Symmetrical Asymmetrical    R>L    Skin Creases Base of toes  Ankles   Base of Fingers knees       Abdominal pannus Thigh Lobules  Face/neck   x x  x        BLE COMPARATIVE LIMB VOLUMETRICS 10/10/23  LANDMARK RIGHT  10/10/23  R LEG (A-D) 5466.5 ml  R THIGH (E-G) 9186.6 ml  R FULL LIMB (A-G) 14653.1 ml  Limb Volume differential (LVD)  LVD for LEG = 44.1%, R>L LVD for THIGH= 33.98%, R>L LVD for FULL lower extremity 37.8%, R>L  Volume change since last 08/11/22 R LEG is INCREASED in volume by 59.6%. L THIGH is INCREASED by 38 %, and RLE full limb is INCREASED by 45.38%.  Volume change overall V  (Blank rows = not tested)  LANDMARK LEFT  10/12/23  L LEG (A-D) 3053.6 ml  L THIGH (E-G) 6064.8 ml  L  FULL LIMB (A-G) 9118.4 ml  Limb Volume differential (LVD)  %  Volume change since initial %  Volume change overall %  (Blank rows = not tested)   10 th VISIT PROGRESS NOTE RLE COMPARATIVE LIMB VOLUMETRICS 01/04/24: Deferred until next visit by Pt request.  LANDMARK RIGHT    R LEG (A-D) TBA  R THIGH (E-G) TBA  R FULL LIMB (A-G) TBA  Limb Volume differential (LVD)    Volume change since last 08/11/22 TBA  Volume change overall TBA  (Blank rows = not tested)  20 th VISIT PROGRESS NOTE RLE COMPARATIVE LIMB VOLUMETRICS TBA next session  LANDMARK RIGHT    R LEG (A-D) 6005.7 ml  R THIGH (E-G) 9400.3 ml  R FULL LIMB (A-G) 15406.02 ml  Limb Volume differential (LVD)    Volume change since last measured on 10/10/23 R LEG is INCREASED in volume by 9.9%. L THIGH is INCREASED by 2.32 %, and RLE full limb is INCREASED by 5.13% since last measured on 10/10/23.  Volume change since commencing OT for CDT %   ENDOMETRIAL CA-related LYMPHEDEMA Hx:  SURGERY TYPE/DATE: 2013 NUMBER OF LYMPH NODES REMOVED: 30 by report- bilateral pelvic and periaortic CHEMOTHERAPY: yes RADIATION:yes INFECTIONS: no hx cellulitis GAIT: Distance walked: >500 ft Assistive device utilized: single point cane Level of assistance: Modified independence-  extra time Comments: R limp  LYMPHEDEMA LIFE IMPACT SCALE (LLIS): Intake 10/12/23 75% (The extent to which lymphedema related problems impacted your life over the past week)  FOTO (functional outcome measure):10/10/23 INTAKE: 36% No Out take score. FOTO assessment discontinued by clinic.  PATIENT EDUCATION:  Education details: Continued Pt/ CG edu for lymphedema self care home program throughout session. Topics include outcome of comparative limb volumetrics- starting limb volume differentials (LVDs), technology and gradient techniques used for short stretch, multilayer compression wrapping, simple self-MLD, therapeutic lymphatic pumping exercises, skin/nail care, LE precautions,. compression garment recommendations and specifications, wear and care schedule and compression garment donning / doffing w assistive devices. Discussed progress towards all OT goals since commencing CDT. All questions answered to the Pt's satisfaction. Good return. Person educated: Patient Education method: Explanation, Demonstration, and Handouts Education comprehension: verbalized understanding, returned demonstration, and  needs further education  LE SELF-CARE HOME  PROGRAM: Simple self-MLD/daily to affected quadrant and body part At least 2 x daily BLE Lymphatic Pumping There ex 1 set of 10 reps each, in order, bilaterally Daily skin care to affected body part to limit infection risk and increase skin excursion Compression Bandaging Intensive stage compression: multilayer short stretch wraps with gradient techniques. One limb at a time. Length patient dependent. Self-management Phase: Appropriate daytime compression garment and hours-of-sleep device  Compression garments: Custom-made gradient compression garments and hours-of-sleep devices are medically necessary because they are uniquely sized and shaped to fit the exact dimensions of the affected extremities and to provide accurate and consistent gradient  compression and containment, essential  for optimally managing chronic, progressive lymphedema. The convoluted HOS devices are medically necessary to facilitate increased lymphatic circulation and limit fibrosis formation when sleeping. Multiple custom compression garments are needed for optimal hygiene to limit infection risk. Custom compression garments should be replaced q 3-6 months When worn consistently for optimal lipo-lymphedema self-management over time.  Pt will benefit from [id number]- Circaid reduction kit whole leg to increase independence with lymphedema self care, to  reduce limb volume  for body symmetry and balance, and to limit infection risk and progression.  ASSESSMENT:  CLINICAL IMPRESSION:  Volumetrics reveal increases for both leg, thigh and full limb for RLE today. RLE very swollen today after being away 2 weeks. R LEG is INCREASED in volume by 9.9%. L THIGH is INCREASED by 2.32 %, and RLE full limb is INCREASED by 5.13% since last measured on 10/10/23.  Progress is slow. Lymphedema management  with combination of progressive, secondary lymphedema with connective tissue disease is very stubborn and challenging.  Pt tolerated MLD to RLE/RLQ without increased pain. We also used deep fibrosis techniques to distal leg and medial malleolus with palpable softening in tissue density after session.Applied multilayer compression wraps as established using gradient techniques.  Cont as per POC.  10/10/23 Lymphedema Episode 2, Initial OT Evaluation : Mariapaula Krist is a 13 y a female presenting with chronic, progressive, moderate, Stage II, RLE/RLQ cancer -related lymphedema with onset 11 years ago after Rx for endometrial cancer. Pt is well known to this therapist as she has successfully undergone Complete Decongestive Therapy (CDT)  this clinic bin the past. Pt reports recent exacerbation of RLE swelling worsened as inflammation in L hip has worsened over time.  Pt has a hip replacement scheduled  in late December here it [facility]. RE limb volumetrics today reveal that R LEG volume is dramatically increased by 59.65 since last visit. R LEG VOLUME is INCREASED in volume by 59.6%. L THIGH is INCREASED by 38 %, and RLE full limb is INCREASED by 45.38%. Pt presents with LE-related skin changes. Prolonged inflammation in the R hip joint has likely overloaded  already RLE/RLQ lymphatics.  RLE/RLQ lymphedema limits Pt's ability to perform basic and instrumental ADLs, including functional ambulation, mobility and transfers, grooming , lower body bathing and dressing, skin inspection and skin care. Pt has difficulty  reaching her lower legs and feet to apply compression wraps and don/doff        compression stockings. LE limits ability to P[perform instrumental ADLs, including driving, yard work and home management activities. Lymphedema limits her ability to participate in leisure pursuits and productive activities, and it negatively impacts body image, life roles and quality of life.   Pt will benefit from an Intensive and follow along course of lymphedema. CDT will consist of Manual Lymphatic gainage   (  MLD), skin care, therapeutic exercise and compression, wraps initially then garments. Pt will need assistance throughout CDT   for applying compression wraps due to limited hip AROM and decreased skin flexibility. Without skilled Occupational Therapy for lymphedema care, lymphedema will progress and further functional decline is expected.   In prep for upcoming hip replacement OT will educate Pt re assistive devices, including tub transfer bench and elevated toilet seat, in keeping with hip precautions.  OBJECTIVE IMPAIRMENTS: Abnormal gait, decreased activity tolerance, decreased balance, decreased knowledge of use of DME, decreased mobility, difficulty walking, decreased ROM, decreased strength, increased edema, decreased skin flexibility, increased fascial restrictions, impaired sensation, pain, and chronic,  progressive LLE/LLQ lymphatic swelling and associated pain.   ADL LIMITATIONS: carrying, lifting, bending, sitting, standing, squatting, sleeping, stairs, transfers, bed mobility, bathing, dressing, hygiene/grooming, and productive activities, leisure pursuits, social participation, body image  driving, shopping, housework, yard work, cooking, meal prep  PERSONAL FACTORS: Past/current experiences and 3+ comorbidities: Morphea Scleroderma, Mixed connective tissue disease, and osteoarthritis  are also affecting patient's functional outcome.   REHAB POTENTIAL: Good  EVALUATION COMPLEXITY: Moderate  GOALS: Goals reviewed with patient? Yes  SHORT TERM GOALS: Target date: 4th OT Rx visit   Pt will demonstrate understanding of lymphedema precautions and prevention strategies with modified independence using a printed reference to identify at least 5 precautions and discussing how s/he may implement them into daily life to reduce risk of progression with extra time.  Baseline:Max A Goal status: GOAL MET  2.  Pt will be able to apply multilayer, knee length, gradient, compression wraps to one leg at a time with modified assistance (extra time and assistive device/s) to decrease limb volume, to limit infection risk, and to limit lymphedema progression.  Baseline: Dependent Goal status: GOAL MET  LONG TERM GOALS: Target date: 01/09/24 (12 weeks)  Given this patient's Intake score 36% on the functional outcomes FOTO tool, patient will experience an increase in function of 3 points to improve basic and instrumental ADLs performance, including lymphedema self-care.  Baseline: Max A Goal status: DEFERRED as Janyth Contes has been discontinued  2.  Given this patient's Intake score of  75% on the Lymphedema Life Impact Scale (LLIS), patient will experience a reduction of at least 5 points in her perceived level of functional impairment resulting from lymphedema to improve functional performance and quality of life  (QOL). Baseline: % Goal status: PROGRESSING  3.  Pt will achieve at least a 10% volume reduction in full, RLE limb volume to return limb to typical size and shape, to limit infection risk and LE progression, to decrease pain, to improve function. Baseline: Dependent Goal status:PROGRESSING.  Volumetrics deferred until next week to focus on manual therapy and relief of pain/discomfort. To date by visual assessment I would estimate no more than a 10% volume reduction. DENSITY IN THIGH HAS REDUCED only SLIGHTLY. Morphea at R groin continues to present a significant obstacle to limb volume decongestion and fibrosis reduction.  Progress is slow. Lymphedema management  with combination of progressive, secondary lymphedema with connective tissue disease is very stubborn and challenging.   4.  Pt will obtain proper compression garments/devices and achieve modified independence (extra time + assistive devices) with donning/doffing to optimize limb volume reductions and limit LE  progression over time. Baseline:  Goal status: 01/04/24: PARTIALLY MET; In an effort to reduce burden of care on her spouse, Pt obtained adjustable, Velcro style R full leg, Circaid leg reduction kit for use s/p THA, but this proved  not effective for controlling swelling, and were not easier to don and doff more independently. We've resumed wrapping until she is able to fit back into custom compression garments. 02/14/24: Ongoing  5.  During Intensive phase CDT , with modified independence, Pt will achieve at least 85% compliance with all lymphedema self-care home program components, including daily skin care, compression wraps and /or garments, simple self MLD and lymphatic pumping therex to habituate LE self care protocol  into ADLs for optimal LE self-management over time. Baseline: Dependent Goal status: 02/14/24: GOAL MET and EXCEEDED PLAN:  OT FREQUENCY: 2x/week  OT DURATION: 12 weeks and PRN  PLANNED INTERVENTIONS: Complete  Decongestive Therapy (Intensive and supported Self-Management Phases), 97110-Therapeutic exercises, 97530- Therapeutic activity, 97535- Self Care, 13244- Manual therapy, Patient/Family education, Taping, Manual lymph drainage, Scar mobilization, Compression bandaging, DME instructions, and skin care to reduce infection risk throughout manual therapy. Fit with replacement compression garments ASAP  PLAN FOR NEXT SESSION:  Complete RLE comparative limb volumetrics for progress note update Cont RLE/RLQ MLD Cont multilayer compression wrapping    Loel Dubonnet, MS, OTR/L, CLT-LANA 03/14/24 3:07 PM  03/14/2024, 3:07 PM

## 2024-03-16 NOTE — Therapy (Signed)
 OUTPATIENT PHYSICAL THERAPY TREATMENT    Patient Name: Elizabeth Martin MRN: [id number] DOB:09-29-1961, 63 y.o., female Today's Date: 03/19/2024  END OF SESSION:  PT End of Session - 03/19/24 0844     Visit Number 13    Number of Visits 35    Date for PT Re-Evaluation 06/04/24    Authorization Type [employer]    Progress Note Due on Visit 20    PT Start Time 0845    PT Stop Time 0926    PT Time Calculation (min) 41 min    Equipment Utilized During Treatment Gait belt    Activity Tolerance Patient tolerated treatment well;No increased pain;Other (comment)   quick to faitgue in RLE  mm   Behavior During Therapy [facility] for tasks assessed/performed                    Past Medical History:  Diagnosis Date   Anxiety    Asthma    Autoimmune disease (HCC)    Cancer (HCC)    Endometrial lymph node removal (30)   Corneal abrasion, right 05/24/2022   Depression    GERD (gastroesophageal reflux disease)    H/O blood clots    upper rt groin and behind both knees   History of hiatal hernia    Hypertension    Lymphedema    right leg   Morphea scleroderma    PONV (postoperative nausea and vomiting)    states gets violently ill   Past Surgical History:  Procedure Laterality Date   BASAL CELL CARCINOMA EXCISION Left    CHOLECYSTECTOMY  2008   FLEXIBLE SIGMOIDOSCOPY N/A 05/19/2022   Procedure: FLEXIBLE SIGMOIDOSCOPY;  Surgeon: Toledo, Alphonsus Jeans, MD;  Location: [facility] ENDOSCOPY;  Service: Gastroenterology;  Laterality: N/A;   HERNIA REPAIR  2004   ILEOSTOMY CLOSURE N/A 08/24/2022   Procedure: ILEOSTOMY TAKEDOWN, open loop with Apolonio Bay, PA-C to assist;  Surgeon: Emmalene Hare, MD;  Location: [facility] ORS;  Service: General;  Laterality: N/A;   IVC FILTER INSERTION N/A 11/09/2023   Procedure: IVC FILTER INSERTION;  Surgeon: Celso College, MD;  Location: [facility] INVASIVE CV LAB;  Service: Cardiovascular;  Laterality: N/A;   IVC FILTER REMOVAL N/A 01/02/2024   Procedure: IVC FILTER  REMOVAL;  Surgeon: Celso College, MD;  Location: [facility] INV[facility] CV [facility];  Service: Cardiovascular;  Laterality: N/A;   PLANTAR FASCIECTOMY     ROBOTIC ASSISTED TOTAL HYSTERECTOMY WITH BILATERAL SALPINGO OOPHERECTOMY  02/14/2012   ROTATOR CUFF REPAIR Left 03/24/2022   TONSILLECTOMY     TOTAL HIP ARTHROPLASTY Right 11/14/2023   Procedure: TOTAL HIP ARTHROPLASTY - posterior;  Surgeon: Venus Ginsberg, MD;  Location: [facility] ORS;  Service: Orthopedics;  Laterality: Right;   XI ROBOTIC ASSISTED LOWER ANTERIOR RESECTION N/A 05/24/2022   Procedure: XI ROBOTIC ASSISTED LOWER ANTERIOR RESECTION;  Surgeon: Emmalene Hare, MD;  Location: [facility] ORS;  Service: General;  Laterality: N/A;   Patient Active Problem List   Diagnosis Date Noted   Constipation 01/10/2024   Nausea & vomiting 01/10/2024   S/P total right hip arthroplasty 11/14/2023   Blood coagulation disorder (HCC) 11/10/2023   PTSD (post-traumatic stress disorder) 11/10/2023   Severe episode of recurrent major depressive disorder, without psychotic features (HCC) 11/10/2023   GAD (generalized anxiety disorder) 11/10/2023   High risk medication use 11/10/2023   Anxiety and depression 03/22/2023   Cervical radiculopathy 03/16/2023   Colon cancer screening 03/15/2023   Chronic idiopathic constipation 03/15/2023   Dyspnea 03/07/2023   [facility]  M[place] spotted fever 03/07/2023   Strain of gastrocnemius tendon 03/07/2023   Lower abdominal pain 03/07/2023   Pain in pelvis 03/07/2023   Small bowel obstruction (HCC) 02/11/2023   Raynaud disease 02/11/2023   Polyarthralgia 02/11/2023   Edema 01/03/2023   Diverticular disease 12/10/2022   Hot flashes 12/10/2022   Pain of right lower extremity 12/10/2022   Paresthesia of lower extremity 12/10/2022   Primary insomnia 12/10/2022   Thyroid nodule 12/10/2022   Multiple joint pain 12/10/2022   Basal cell carcinoma of skin 12/10/2022   Acute bilateral low back pain without sciatica 11/10/2022   Chronic  lower back pain 09/05/2022   S/P closure of ileostomy 08/24/2022   Ileostomy in place [facility])    Morphea 07/13/2022   Colonic stricture (HCC) 05/19/2022   Large bowel obstruction (HCC) 05/19/2022   Hypokalemia 05/18/2022   Abdominal pain 05/18/2022   Osteoarthritis of left knee 02/24/2022   Mixed connective tissue disease (HCC) 02/17/2022   Adhesive capsulitis of left shoulder 01/27/2022   Glenohumeral arthritis, left 01/27/2022   Microscopic hematuria 01/12/2022   Otorrhagia of right ear 12/10/2021   Elevated testosterone level in female 11/04/2021   Anti-TPO antibodies present 10/20/2021   Rash 10/13/2021   Hashimoto thyroiditis, 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   Hashimoto's disease 03/23/2021   Exposure to severe acute respiratory syndrome coronavirus 2 (SARS-CoV-2) 12/19/2020   Migraine without aura and without status migrainosus, not intractable 08/19/2020   Myofascial pain 07/30/2020   Neck pain 07/30/2020   Upper back pain 07/30/2020   Cervical facet joint syndrome 07/30/2020   Hair loss 07/14/2020   Edema of lower extremity 07/11/2020   Adenomatous colon polyp 02/26/2020   Gastroesophageal reflux disease 02/15/2020   Diarrhea 02/15/2020   Sleep disorder 01/16/2020   Hyperlipidemia 07/11/2019   Right shoulder pain 04/04/2019   Pre-operative clearance 12/13/2018   Endometrial cancer (HCC) 04/05/2018   Patellofemoral pain syndrome of right knee 03/20/2018   Gastroenteritis 12/07/2017   Fatigue 11/16/2017   Morbid obesity (HCC) 08/11/2017   Stucco keratoses 08/11/2017   Essential hypertension 08/09/2017   History of endometrial cancer 08/09/2017   Lymphedema 08/09/2017   Menopause 08/09/2017   Malignant neoplastic disease (HCC) 04/07/2017   Cellulitis 09/18/2016   Benign hypertension 06/02/2016   Asthma 06/04/1969    PCP: Eather Golder MD   REFERRING PROVIDER: Eather Golder MD   REFERRING DIAG: s/p R hip replacement  THERAPY DIAG:  Muscle weakness  (generalized)  Pain in right hip  Difficulty in walking, not elsewhere classified  Unsteadiness on feet  Rationale for Evaluation and Treatment: Rehabilitation  ONSET DATE: 11/14/23  SUBJECTIVE:   SUBJECTIVE STATEMENT:    Pt reports R hip pain is 6/10. Patient reports ice does sometimes help but R hip seems to be more sore and painful since her trip. Patient reports very sore after last session.   PERTINENT HISTORY: s/p R hip arthropaslasty 11/14/23 with Dr. Clyda Dark posterior approach, blood coagulation disorder, PTSD, anxiety, cervical radiculopathy dypsnea, polyarthralgia, Raynaud disease, s/p closure of ileostomy, Hashimoto thyroid, endometrial cancer, menopause, endometrial cancer, lymphedema. Patient had home health PT prior to PT here.   PAIN:  Are you having pain?  4/10 Rt hip joint pain, posterior portion of hip.    PRECAUTIONS: Other: recovering from hip sx  RED FLAGS: None   WEIGHT BEARING RESTRICTIONS: No  FALLS:  Has patient fallen in last 6 months? Yes. Number of falls 2  LIVING ENVIRONMENT: Lives with: lives with their spouse Lives in: House/apartment Stairs:  No Has following equipment at home: Single point cane  OCCUPATION: retired,   PLOF: Independent  PATIENT GOALS: return to hiking, working out, gardening.   NEXT MD VISIT: March 2025   OBJECTIVE:  Note: Objective measures were completed at Evaluation unless otherwise noted.  DIAGNOSTIC FINDINGS:   IMPRESSION: Right hip replacement.  No visible complicating feature.  PATIENT SURVEYS:  LEFS 5/80  COGNITION: Overall cognitive status: Within functional limits for tasks assessed    MUSCLE LENGTH: Hamstrings: limited bilaterally with R>L   LOWER EXTREMITY ROM:  Passive ROM Right eval Left eval  Hip flexion 48 flexion PROM supine   Hip extension    Hip abduction 5 degrees AROM     (Blank rows = not tested)  LOWER EXTREMITY MMT:  MMT Right eval Left eval  Hip flexion 2+ 5  Hip  extension  5  Hip abduction 2+* painful 5  Hip adduction 2+ 5  Hip internal rotation    Hip external rotation    Knee flexion 3 5  Knee extension 3 5  Ankle dorsiflexion 3 5  Ankle plantarflexion 3 5   (Blank rows = not tested)    FUNCTIONAL TESTS:  5 times sit to stand: 29 6 minute walk test:  10 meter walk test: 15 seconds with SPC   GAIT: Distance walked: 60 ft Assistive device utilized: Single point cane Level of assistance: CGA Comments: antalgic gait pattern with decreased stance time RLE. Slight vaulting pattern.  Stair negotiation: -step to pattern lead with LLE, one hand on cane one hand on rail ascending. Descending step to pattern down with RLE.                                                                                                                               TREATMENT DATE: 03/19/24   TA to improve BLE ROM and allow improved independence with ADLs and community mobility.   Nustep level 2 x 6 minutes to improve BLE strengthening and cardiorespiratory endurance   Seated:  Hip circles (with leg straight) x 30 second bouts CW/CCW each direction LAQ 2 x 10 each LE   Sit to stand 2 x 10 with no UE support   Lateral step over half bolsters x10 bil with no UE support  Forward/bckwd step over half bolster x10 each LE   Standing hip abduction 2x10 each LE   PATIENT EDUCATION:  Education details: exercise technique Person educated: Patient Education method: Explanation, Demonstration, Tactile cues, and Verbal cues Education comprehension: verbalized understanding, returned demonstration, verbal cues required, tactile cues required, and needs further education  HOME EXERCISE PROGRAM: Access Code: [facility] URL: [email or web address] Date: 02/08/2024 Prepared by: Aurora Lees  Exercises - Side Stepping with Counter Support  - 1 x daily - 7 x weekly - 2 sets - 10 reps - 5 hold - Standing Hip Extension  - 1 x daily - 7 x weekly - 2 sets -  10 reps -  5 hold - Standing March with Counter Support  - 1 x daily - 7 x weekly - 2 sets - 10 reps - 5 hold - Standing Hip Abduction with Counter Support  - 1 x daily - 7 x weekly - 2 sets - 10 reps - Seated Hamstring Stretch  - 1 x daily - 7 x weekly - 2 sets - 2 reps - 30 hold - Sit to Stand  - 1 x daily - 7 x weekly - 2 sets - 10 reps - 5 hold - Supine Heel Slide  - 1 x daily - 7 x weekly - 2 sets - 6 reps - Supine Gluteal Sets  - 1 x daily - 7 x weekly - 2 sets - 10 reps - Supine Bridge  - 1 x daily - 7 x weekly - 3 sets - 5 reps - 2-3sec hold - Clamshell  - 1 x daily - 7 x weekly - 3 sets - 10 reps - Supine Short Arc Quad  - 1 x daily - 7 x weekly - 3 sets - 15 reps - Small Range Straight Leg Raise  - 1 x daily - 7 x weekly - 3 sets - 5 reps  ASSESSMENT: CLINICAL IMPRESSION:    Patient arrives to treatment session with 6/10 pain in R hip with ongoing soreness. Patient reports increased soreness after last session so adjusted session this date to patient's tolerance to avoid excessive fatigue and soreness. Session focused on BLE strengthening in standing and sitting. Tolerated treatment session well with decrease in pain to 4/10. Patient will continue to benefit from skilled therapy to address remaining deficits in order to improve quality of life and return to PLOF.    OBJECTIVE IMPAIRMENTS: Abnormal gait, decreased activity tolerance, decreased balance, decreased coordination, decreased endurance, decreased mobility, difficulty walking, decreased ROM, decreased strength, hypomobility, increased edema, impaired perceived functional ability, impaired flexibility, improper body mechanics, postural dysfunction, and pain.   ACTIVITY LIMITATIONS: carrying, lifting, bending, sitting, standing, squatting, sleeping, stairs, transfers, bed mobility, bathing, toileting, dressing, reach over head, hygiene/grooming, locomotion level, and caring for others  PARTICIPATION LIMITATIONS: meal prep,  cleaning, laundry, interpersonal relationship, driving, shopping, community activity, and yard work  PERSONAL FACTORS: Age, Past/current experiences, Time since onset of injury/illness/exacerbation, and 3+ comorbidities: blood coagulation disorder, PTSD, anxiety, cervical radiculopathy dypsnea, polyarthralgia, Raynaud disease, s/p closure of ileostomy, Hashimoto thyroid, endometrial cancer, menopause, endometrial cancer, lymphedema  are also affecting patient's functional outcome.   REHAB POTENTIAL: Good  CLINICAL DECISION MAKING: Evolving/moderate complexity  EVALUATION COMPLEXITY: Moderate   GOALS: Goals reviewed with patient? Yes  SHORT TERM GOALS: Target date: 01/19/2024  Patient will be independent in home exercise program to improve strength/mobility for better functional independence with ADLs. Baseline:1/30: HEP given 3/12: pt reports completing 3-4 days a week depending on soreness.  Goal status:  MET  LONG TERM GOALS: Target date: 06/04/2024  Patient (> 5 years old) will complete five times sit to stand test in < 10 seconds indicating an increased LE strength and improved balance. Baseline: 1/30: 29 seconds 3/12: 19.41 03/12/2024= 17.44 seconds- without UE support Goal status: PROGRESSING  2.  Patient will increase six minute walk test distance to >1000 for progression to community ambulator and improve gait ability Baseline: 430 ft in 5 min and required seated rest to end test due to fatigue, uses SPC 851ft with SPC and no standing or seated rest break; 03/12/2024= 950 feet with SPC Goal status: in progress   3.  Patient will increase 10 meter walk test to >1.11m/s as to improve gait speed for better community ambulation and to reduce fall risk. Baseline: 1/30: 15 seconds with SPC  Without SPC: 10.63 and 10.38 speed: 0.95 m/s; with SPC 10.98 and 11.18; 03/12/2024= 1.0 m/s  Goal status: PROGRESSING  4.  Patient will increase lower extremity functional scale to >60/80 to  demonstrate improved functional mobility and increased tolerance with ADLs.  Baseline: 1/30: 5/80 3/12: 20/80; 03/12/2024= 29/80 Goal status: Progressing  PLAN:  PT FREQUENCY: 1-2x/week PT DURATION: 12 weeks PLANNED INTERVENTIONS: 97164- PT Re-evaluation, 97110-Therapeutic exercises, 97530- Therapeutic activity, 97112- Neuromuscular re-education, 97535- Self Care, 16109- Manual therapy, 97116- Gait training, [phone]- Splinting, 97014- Electrical stimulation (unattended), [phone]- Electrical stimulation (manual), 97035- Ultrasound, 91478- Traction (mechanical), Patient/Family education, Balance training, Stair training, Taping, Joint mobilization, Joint manipulation, Spinal manipulation, Spinal mobilization, Manual lymph drainage, Scar mobilization, Compression bandaging, Vestibular training, Visual/preceptual remediation/compensation, DME instructions, Cryotherapy, and Moist heat  PLAN FOR NEXT SESSION:   Dynamic gait and  R hip strength and ROM,   Janine Melbourne, PT, DPT  Physical Therapist - [facility]  [facility]  8:44 AM 03/19/24

## 2024-03-19 ENCOUNTER — Ambulatory Visit: Admitting: Occupational Therapy

## 2024-03-19 ENCOUNTER — Encounter: Payer: Self-pay | Admitting: Occupational Therapy

## 2024-03-19 ENCOUNTER — Ambulatory Visit

## 2024-03-19 DIAGNOSIS — M25551 Pain in right hip: Secondary | ICD-10-CM

## 2024-03-19 DIAGNOSIS — I890 Lymphedema, not elsewhere classified: Secondary | ICD-10-CM | POA: Diagnosis not present

## 2024-03-19 DIAGNOSIS — R262 Difficulty in walking, not elsewhere classified: Secondary | ICD-10-CM

## 2024-03-19 DIAGNOSIS — R2681 Unsteadiness on feet: Secondary | ICD-10-CM

## 2024-03-19 DIAGNOSIS — M6281 Muscle weakness (generalized): Secondary | ICD-10-CM

## 2024-03-19 NOTE — Therapy (Unsigned)
 OUTPATIENT OCCUPATIONAL THERAPY TREATMENT NOTE  LOWER EXTREMITY LYMPHEDEMA  Patient Name: Elizabeth Martin MRN: [id number] DOB:1961-10-23, 63 y.o., female Today's Date: 03/20/2024  REPORTING PERIOD:   END OF SESSION:  Lymphedema Episode 2   OT End of Session - 03/19/24 1012     Visit Number 22    Number of Visits 36    Date for OT Re-Evaluation 04/16/24    OT Start Time 1005    OT Stop Time 1110    OT Time Calculation (min) 65 min    Activity Tolerance Patient tolerated treatment well;No increased pain    Behavior During Therapy WFL for tasks assessed/performed              Past Medical History:  Diagnosis Date   Anxiety    Asthma    Autoimmune disease (HCC)    Cancer (HCC)    Endometrial lymph node removal (30)   Corneal abrasion, right 05/24/2022   Depression    GERD (gastroesophageal reflux disease)    H/O blood clots    upper rt groin and behind both knees   History of hiatal hernia    Hypertension    Lymphedema    right leg   Morphea scleroderma    PONV (postoperative nausea and vomiting)    states gets violently ill   Past Surgical History:  Procedure Laterality Date   BASAL CELL CARCINOMA EXCISION Left    CHOLECYSTECTOMY  2008   FLEXIBLE SIGMOIDOSCOPY N/A 05/19/2022   Procedure: FLEXIBLE SIGMOIDOSCOPY;  Surgeon: Toledo, Boykin Nearing, MD;  Location: [facility];  Service: Gastroenterology;  Laterality: N/A;   HERNIA REPAIR  2004   ILEOSTOMY CLOSURE N/A 08/24/2022   Procedure: ILEOSTOMY TAKEDOWN, open loop with Lynden Oxford, PA-C to assist;  Surgeon: Henrene Dodge, MD;  Location: [facility];  Service: General;  Laterality: N/A;   IVC FILTER INSERTION N/A 11/09/2023   Procedure: IVC FILTER INSERTION;  Surgeon: Annice Needy, MD;  Location: [facility];  Service: Cardiovascular;  Laterality: N/A;   IVC FILTER REMOVAL N/A 01/02/2024   Procedure: IVC FILTER REMOVAL;  Surgeon: Annice Needy, MD;  Location: [facility];  Service:  Cardiovascular;  Laterality: N/A;   PLANTAR FASCIECTOMY     ROBOTIC ASSISTED TOTAL HYSTERECTOMY WITH BILATERAL SALPINGO OOPHERECTOMY  02/14/2012   ROTATOR CUFF REPAIR Left 03/24/2022   TONSILLECTOMY     TOTAL HIP ARTHROPLASTY Right 11/14/2023   Procedure: TOTAL HIP ARTHROPLASTY - posterior;  Surgeon: Reinaldo Berber, MD;  Location: [facility];  Service: Orthopedics;  Laterality: Right;   XI ROBOTIC ASSISTED LOWER ANTERIOR RESECTION N/A 05/24/2022   Procedure: XI ROBOTIC ASSISTED LOWER ANTERIOR RESECTION;  Surgeon: Henrene Dodge, MD;  Location: [facility];  Service: General;  Laterality: N/A;   Patient Active Problem List   Diagnosis Date Noted   Constipation 01/10/2024   Nausea & vomiting 01/10/2024   S/P total right hip arthroplasty 11/14/2023   Blood coagulation disorder (HCC) 11/10/2023   PTSD (post-traumatic stress disorder) 11/10/2023   Severe episode of recurrent major depressive disorder, without psychotic features (HCC) 11/10/2023   GAD (generalized anxiety disorder) 11/10/2023   High risk medication use 11/10/2023   Anxiety and depression 03/22/2023   Cervical radiculopathy 03/16/2023   Colon cancer screening 03/15/2023   Chronic idiopathic constipation 03/15/2023   Dyspnea 03/07/2023   [facility] spotted fever 03/07/2023   Strain of gastrocnemius tendon 03/07/2023   Lower abdominal pain 03/07/2023   Pain in pelvis 03/07/2023   Small bowel  obstruction (HCC) 02/11/2023   Raynaud disease 02/11/2023   Polyarthralgia 02/11/2023   Edema 01/03/2023   Diverticular disease 12/10/2022   Hot flashes 12/10/2022   Pain of right lower extremity 12/10/2022   Paresthesia of lower extremity 12/10/2022   Primary insomnia 12/10/2022   Thyroid nodule 12/10/2022   Multiple joint pain 12/10/2022   Basal cell carcinoma of skin 12/10/2022   Acute bilateral low back pain without sciatica 11/10/2022   Chronic lower back pain 09/05/2022   S/P closure of ileostomy 08/24/2022   Ileostomy in  place [facility])    Morphea 07/13/2022   Colonic stricture (HCC) 05/19/2022   Large bowel obstruction (HCC) 05/19/2022   Hypokalemia 05/18/2022   Abdominal pain 05/18/2022   Osteoarthritis of left knee 02/24/2022   Mixed connective tissue disease (HCC) 02/17/2022   Adhesive capsulitis of left shoulder 01/27/2022   Glenohumeral arthritis, left 01/27/2022   Microscopic hematuria 01/12/2022   Otorrhagia of right ear 12/10/2021   Elevated testosterone level in female 11/04/2021   Anti-TPO antibodies present 10/20/2021   Rash 10/13/2021   Hashimoto thyroiditis, 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   Hashimoto's disease 03/23/2021   Exposure to severe acute respiratory syndrome coronavirus 2 (SARS-CoV-2) 12/19/2020   Migraine without aura and without status migrainosus, not intractable 08/19/2020   Myofascial pain 07/30/2020   Neck pain 07/30/2020   Upper back pain 07/30/2020   Cervical facet joint syndrome 07/30/2020   Hair loss 07/14/2020   Edema of lower extremity 07/11/2020   Adenomatous colon polyp 02/26/2020   Gastroesophageal reflux disease 02/15/2020   Diarrhea 02/15/2020   Sleep disorder 01/16/2020   Hyperlipidemia 07/11/2019   Right shoulder pain 04/04/2019   Pre-operative clearance 12/13/2018   Endometrial cancer (HCC) 04/05/2018   Patellofemoral pain syndrome of right knee 03/20/2018   Gastroenteritis 12/07/2017   Fatigue 11/16/2017   Morbid obesity (HCC) 08/11/2017   Stucco keratoses 08/11/2017   Essential hypertension 08/09/2017   History of endometrial cancer 08/09/2017   Lymphedema 08/09/2017   Menopause 08/09/2017   Malignant neoplastic disease (HCC) 04/07/2017   Cellulitis 09/18/2016   Benign hypertension 06/02/2016   Asthma 06/04/1969    PCP: Pennie Banter, MD  REFERRING PROVIDER: Pennie Banter, MD  REFERRING DIAG: I89.0   THERAPY DIAG:  Lymphedema, not elsewhere classified  Rationale for Evaluation and Treatment: Rehabilitation  ONSET DATE: 2013   (Cancer-related, endometrial 2013)  SUBJECTIVE:                                                                                                                                                                                           SUBJECTIVE STATEMENT:Patient returns to OT for lymphedema  care after PT session. Pt states she is sore and winces when changing position during session. Pt does not rate pain numerically.   PERTINENT HISTORY: Asthma, Autoimmune Disease (Connective Tissue Disease), Endometrial Ca w/ LND ( 30 bilateral pelvic and periaortic LN), adjuvant chemotherapy and XRT,   H/O blood clots, RLE/RLQ lymphedema, Robotic assisted total hysterectomy w/ bilateral oophorectomy L Rotator Cuff repair, S/P ileostomy 2/2 colon stricture 05/2022; s/p R hip arthroplasty 11/11/23.   PAIN:  Are you having pain? YES. See subjective; not rated /10 Pain location: RLE, R hip, cervical spine Pain description: sore, aching, heavy, full, tight,  Aggravating factors: standing, walking, extended dependent sitting Relieving factors: elevation, movement  PRECAUTIONS: Fall and Other: LYMPHEDEMA  WEIGHT BEARING RESTRICTIONS: Yes 5# lifting restriction- shoulder  FALLS:  Has patient fallen in last 6 months? No  LIVING ENVIRONMENT: Lives with: lives with their spouse Lives in: House/apartment Stairs: No;  Has following equipment at home: Single point cane, Environmental consultant - 2 wheeled, Environmental consultant - 4 wheeled, and Wheelchair (manual)  OCCUPATION: retired Control and instrumentation engineer: gardening, hiking, biking, dancing- unable to participate in any of these due to pain and swelling  HAND DOMINANCE: right   PRIOR LEVEL OF FUNCTION: Independent with basic ADLs, Independent with household mobility without device, Independent with community mobility without device, Requires assistive device for independence, Needs assistance with homemaking, Needs assistance with transfers, and Leisure: decreased  social participation for  leisure pursuits due to impaired mobility and pain  PATIENT GOALS:  Be able to move freely without pain Reduce limb volume to be able to lift limb for basic daily ADLs and functional ambulation Reduce limb volume to increase ability to perform lower body dressing and bathing and grooming Reduce limb to increase body image  OBJECTIVE: Note: Objective measures were completed at Evaluation unless otherwise noted.  COGNITION:  Overall cognitive status: Within functional limits for tasks assessed   OBSERVATIONS / OTHER ASSESSMENTS:   POSTURE: WFL  LE ROM: WFL, but Limited mildly at R knee and ankle due to girth, skin approximation, and joint pain  LE MMT: WFL  Mild, Stage  II, Bilateral Lower Extremity Lymphedema 2/2 CVI and Obesity  Skin  Description Hyper-Keratosis Peau d' Orange Shiny Tight Fibrotic/ Indurated Fatty Doughy Spongy/ boggy   x x x x R>L Severe morphea at  R groin   x   Skin dry Flaky WNL Macerated   mildly sclerotic     Color Redness Varicosities Blanching Hemosiderin Stain Mottled   x x x   x   Odor Malodorous Yeast Fungal infection  WNL      x   Temperature Warm Cool wnl    x     Pitting Edema   1+ 2+ 3+ 4+ Non-pitting         x   Girth Symmetrical Asymmetrical                   Distribution    R>L RLE toes to groin    Stemmer Sign Positive Negative   +    Lymphorrhea History Of:  Present Absent     x    Wounds History Of Present Absent Venous Arterial Pressure Sheer     x        Signs of Infection Redness Warmth Erythema Acute Swelling Drainage Borders                    Sensation Light Touch Deep pressure Hypersensitivity   In tact  Impaired In tact Impaired Absent Impaired   x  x  x     Nails WNL   Fungus nail dystrophy   x     Hair Growth Symmetrical Asymmetrical    R>L   Skin Creases Base of toes  Ankles   Base of Fingers knees       Abdominal pannus Thigh Lobules  Face/neck   x x  x        BLE COMPARATIVE  LIMB VOLUMETRICS 10/10/23  LANDMARK RIGHT  10/10/23  R LEG (A-D) 5466.5 ml  R THIGH (E-G) 9186.6 ml  R FULL LIMB (A-G) 14653.1 ml  Limb Volume differential (LVD)  LVD for LEG = 44.1%, R>L LVD for THIGH= 33.98%, R>L LVD for FULL lower extremity 37.8%, R>L  Volume change since last 08/11/22 R LEG is INCREASED in volume by 59.6%. L THIGH is INCREASED by 38 %, and RLE full limb is INCREASED by 45.38%.  Volume change overall V  (Blank rows = not tested)  LANDMARK LEFT  10/12/23  L LEG (A-D) 3053.6 ml  L THIGH (E-G) 6064.8 ml  L  FULL LIMB (A-G) 9118.4 ml  Limb Volume differential (LVD)  %  Volume change since initial %  Volume change overall %  (Blank rows = not tested)   10 th VISIT PROGRESS NOTE RLE COMPARATIVE LIMB VOLUMETRICS 01/04/24: Deferred until next visit by Pt request.  LANDMARK RIGHT    R LEG (A-D) TBA  R THIGH (E-G) TBA  R FULL LIMB (A-G) TBA  Limb Volume differential (LVD)    Volume change since last 08/11/22 TBA  Volume change overall TBA  (Blank rows = not tested)  20 th VISIT PROGRESS NOTE RLE COMPARATIVE LIMB VOLUMETRICS TBA next session  LANDMARK RIGHT    R LEG (A-D) 6005.7 ml  R THIGH (E-G) 9400.3 ml  R FULL LIMB (A-G) 15406.02 ml  Limb Volume differential (LVD)    Volume change since last measured on 10/10/23 R LEG is INCREASED in volume by 9.9%. L THIGH is INCREASED by 2.32 %, and RLE full limb is INCREASED by 5.13% since last measured on 10/10/23.  Volume change since commencing OT for CDT %   ENDOMETRIAL CA-related LYMPHEDEMA Hx:  SURGERY TYPE/DATE: 2013 NUMBER OF LYMPH NODES REMOVED: 30 by report- bilateral pelvic and periaortic CHEMOTHERAPY: yes RADIATION:yes INFECTIONS: no hx cellulitis GAIT: Distance walked: >500 ft Assistive device utilized: single point cane Level of assistance: Modified independence- extra time Comments: R limp  LYMPHEDEMA LIFE IMPACT SCALE (LLIS): Intake 10/12/23 75% (The extent to which lymphedema related problems impacted your  life over the past week)  FOTO (functional outcome measure):10/10/23 INTAKE: 36% No Out take score. FOTO assessment discontinued by clinic.  PATIENT EDUCATION:  Education details: Continued Pt/ CG edu for lymphedema self care home program throughout session. Topics include outcome of comparative limb volumetrics- starting limb volume differentials (LVDs), technology and gradient techniques used for short stretch, multilayer compression wrapping, simple self-MLD, therapeutic lymphatic pumping exercises, skin/nail care, LE precautions,. compression garment recommendations and specifications, wear and care schedule and compression garment donning / doffing w assistive devices. Discussed progress towards all OT goals since commencing CDT. All questions answered to the Pt's satisfaction. Good return. Person educated: Patient Education method: Explanation, Demonstration, and Handouts Education comprehension: verbalized understanding, returned demonstration, and needs further education  LE SELF-CARE HOME  PROGRAM: Simple self-MLD/daily to affected quadrant and body part At least 2 x daily BLE Lymphatic Pumping There ex 1 set of 10  reps each, in order, bilaterally Daily skin care to affected body part to limit infection risk and increase skin excursion Compression Bandaging Intensive stage compression: multilayer short stretch wraps with gradient techniques. One limb at a time. Length patient dependent. Self-management Phase: Appropriate daytime compression garment and hours-of-sleep device  Compression garments: Custom-made gradient compression garments and hours-of-sleep devices are medically necessary because they are uniquely sized and shaped to fit the exact dimensions of the affected extremities and to provide accurate and consistent gradient compression and containment, essential  for optimally managing chronic, progressive lymphedema. The convoluted HOS devices are medically necessary to facilitate  increased lymphatic circulation and limit fibrosis formation when sleeping. Multiple custom compression garments are needed for optimal hygiene to limit infection risk. Custom compression garments should be replaced q 3-6 months When worn consistently for optimal lipo-lymphedema self-management over time.  Pt will benefit from [id number]- Circaid reduction kit whole leg to increase independence with lymphedema self care, to  reduce limb volume  for body symmetry and balance, and to limit infection risk and progression.  ASSESSMENT:  CLINICAL IMPRESSION:  Progress is slow. Lymphedema management  with combination of progressive, secondary lymphedema with connective tissue disease is very stubborn and challenging.  Pt tolerated MLD to RLE/RLQ without increased pain. We also used deep fibrosis techniques to distal leg and medial malleolus with palpable softening in tissue density after session.Applied multilayer compression wraps as established using gradient techniques.  Cont as per POC.  10/10/23 Lymphedema Episode 2, Initial OT Evaluation : Elizabeth Martin is a 72 y a female presenting with chronic, progressive, moderate, Stage II, RLE/RLQ cancer -related lymphedema with onset 11 years ago after Rx for endometrial cancer. Pt is well known to this therapist as she has successfully undergone Complete Decongestive Therapy (CDT)  this clinic bin the past. Pt reports recent exacerbation of RLE swelling worsened as inflammation in L hip has worsened over time.  Pt has a hip replacement scheduled in late December here it [facility]. RE limb volumetrics today reveal that R LEG volume is dramatically increased by 59.65 since last visit. R LEG VOLUME is INCREASED in volume by 59.6%. L THIGH is INCREASED by 38 %, and RLE full limb is INCREASED by 45.38%. Pt presents with LE-related skin changes. Prolonged inflammation in the R hip joint has likely overloaded  already RLE/RLQ lymphatics.  RLE/RLQ lymphedema limits Pt's ability to  perform basic and instrumental ADLs, including functional ambulation, mobility and transfers, grooming , lower body bathing and dressing, skin inspection and skin care. Pt has difficulty  reaching her lower legs and feet to apply compression wraps and don/doff        compression stockings. LE limits ability to P[perform instrumental ADLs, including driving, yard work and home management activities. Lymphedema limits her ability to participate in leisure pursuits and productive activities, and it negatively impacts body image, life roles and quality of life.   Pt will benefit from an Intensive and follow along course of lymphedema. CDT will consist of Manual Lymphatic gainage   (MLD), skin care, therapeutic exercise and compression, wraps initially then garments. Pt will need assistance throughout CDT   for applying compression wraps due to limited hip AROM and decreased skin flexibility. Without skilled Occupational Therapy for lymphedema care, lymphedema will progress and further functional decline is expected.   In prep for upcoming hip replacement OT will educate Pt re assistive devices, including tub transfer bench and elevated toilet seat, in keeping with hip precautions.  OBJECTIVE IMPAIRMENTS: Abnormal  gait, decreased activity tolerance, decreased balance, decreased knowledge of use of DME, decreased mobility, difficulty walking, decreased ROM, decreased strength, increased edema, decreased skin flexibility, increased fascial restrictions, impaired sensation, pain, and chronic, progressive LLE/LLQ lymphatic swelling and associated pain.   ADL LIMITATIONS: carrying, lifting, bending, sitting, standing, squatting, sleeping, stairs, transfers, bed mobility, bathing, dressing, hygiene/grooming, and productive activities, leisure pursuits, social participation, body image  driving, shopping, housework, yard work, cooking, meal prep  PERSONAL FACTORS: Past/current experiences and 3+ comorbidities: Morphea  Scleroderma, Mixed connective tissue disease, and osteoarthritis  are also affecting patient's functional outcome.   REHAB POTENTIAL: Good  EVALUATION COMPLEXITY: Moderate  GOALS: Goals reviewed with patient? Yes  SHORT TERM GOALS: Target date: 4th OT Rx visit   Pt will demonstrate understanding of lymphedema precautions and prevention strategies with modified independence using a printed reference to identify at least 5 precautions and discussing how s/he may implement them into daily life to reduce risk of progression with extra time.  Baseline:Max A Goal status: GOAL MET  2.  Pt will be able to apply multilayer, knee length, gradient, compression wraps to one leg at a time with modified assistance (extra time and assistive device/s) to decrease limb volume, to limit infection risk, and to limit lymphedema progression.  Baseline: Dependent Goal status: GOAL MET  LONG TERM GOALS: Target date: 01/09/24 (12 weeks)  Given this patient's Intake score 36% on the functional outcomes FOTO tool, patient will experience an increase in function of 3 points to improve basic and instrumental ADLs performance, including lymphedema self-care.  Baseline: Max A Goal status: DEFERRED as [employer] has been discontinued  2.  Given this patient's Intake score of  75% on the Lymphedema Life Impact Scale (LLIS), patient will experience a reduction of at least 5 points in her perceived level of functional impairment resulting from lymphedema to improve functional performance and quality of life (QOL). Baseline: % Goal status: PROGRESSING  3.  Pt will achieve at least a 10% volume reduction in full, RLE limb volume to return limb to typical size and shape, to limit infection risk and LE progression, to decrease pain, to improve function. Baseline: Dependent Goal status:PROGRESSING.  Volumetrics deferred until next week to focus on manual therapy and relief of pain/discomfort. To date by visual assessment I would  estimate no more than a 10% volume reduction. DENSITY IN THIGH HAS REDUCED only SLIGHTLY. Morphea at R groin continues to present a significant obstacle to limb volume decongestion and fibrosis reduction.  Progress is slow. Lymphedema management  with combination of progressive, secondary lymphedema with connective tissue disease is very stubborn and challenging.   4.  Pt will obtain proper compression garments/devices and achieve modified independence (extra time + assistive devices) with donning/doffing to optimize limb volume reductions and limit LE  progression over time. Baseline:  Goal status: 01/04/24: PARTIALLY MET; In an effort to reduce burden of care on her spouse, Pt obtained adjustable, Velcro style R full leg, Circaid leg reduction kit for use s/p THA, but this proved not effective for controlling swelling, and were not easier to don and doff more independently. We've resumed wrapping until she is able to fit back into custom compression garments. 02/14/24: Ongoing  5.  During Intensive phase CDT , with modified independence, Pt will achieve at least 85% compliance with all lymphedema self-care home program components, including daily skin care, compression wraps and /or garments, simple self MLD and lymphatic pumping therex to habituate LE self care protocol  into  ADLs for optimal LE self-management over time. Baseline: Dependent Goal status: 02/14/24: GOAL MET and EXCEEDED PLAN:  OT FREQUENCY: 2x/week  OT DURATION: 12 weeks and PRN  PLANNED INTERVENTIONS: Complete Decongestive Therapy (Intensive and supported Self-Management Phases), 97110-Therapeutic exercises, 97530- Therapeutic activity, 97535- Self Care, 54098- Manual therapy, Patient/Family education, Taping, Manual lymph drainage, Scar mobilization, Compression bandaging, DME instructions, and skin care to reduce infection risk throughout manual therapy. Fit with replacement compression garments ASAP  PLAN FOR NEXT SESSION:   Complete RLE comparative limb volumetrics for progress note update Cont RLE/RLQ MLD Cont multilayer compression wrapping    Arnold Bicker, MS, OTR/L, CLT-LANA 03/20/24 1:36 PM  03/20/2024, 1:36 PM

## 2024-03-20 ENCOUNTER — Ambulatory Visit (INDEPENDENT_AMBULATORY_CARE_PROVIDER_SITE_OTHER): Admitting: Licensed Clinical Social Worker

## 2024-03-20 DIAGNOSIS — F3341 Major depressive disorder, recurrent, in partial remission: Secondary | ICD-10-CM

## 2024-03-20 DIAGNOSIS — F4310 Post-traumatic stress disorder, unspecified: Secondary | ICD-10-CM | POA: Diagnosis not present

## 2024-03-20 DIAGNOSIS — F411 Generalized anxiety disorder: Secondary | ICD-10-CM

## 2024-03-20 NOTE — Progress Notes (Signed)
 THERAPIST PROGRESS NOTE  Session Time: 11-11:50am  Participation Level: Active  Behavioral Response: CasualAlertEuthymic  Type of Therapy: Individual Therapy  Treatment Goals addressed:  Template: Depression (OP)         Problem: OP Depression     Dates: Start:  11/25/23       Disciplines: Interdisciplinary, PROVIDER        Goal: LTG: Reduce frequency, intensity, and duration of depression symptoms so that daily functioning is improved     Dates: Start:  11/25/23    Expected End:  04/24/24       Disciplines: Interdisciplinary, PROVIDER         Outcomes     Date/Time User Outcome    03/20/24 1105 Elizabeth Martin P Progressing            Goal note from Appointment 03/20/2024 by Renee Ramus B, LCSW     03/20/24: 95% progress "When I start thinking about how bad things were I try and replace them with good things."                     Goal: STG: Elizabeth Martin will identify cognitive patterns and beliefs that support depression     Dates: Start:  11/25/23    Expected End:  04/24/24       Disciplines: Interdisciplinary, PROVIDER         Outcomes     Date/Time User Outcome    03/20/24 1105 Elizabeth Martin P Progressing            Goal note from Appointment 03/20/2024 by Renee Ramus B, LCSW     03/20/24: 70% progress "I catch the negative thoughts before they spiral."                    Goal: LTG: Pt identifies the goal to Accept chronic pain and learning coping skills      Dates: Start:  11/25/23    Expected End:  04/24/24       Disciplines: Interdisciplinary, PROVIDER         Outcomes     Date/Time User Outcome    03/20/24 1105 Elizabeth Martin P Progressing            Goal note from Appointment 03/20/2024 by Renee Ramus B, LCSW     03/20/24: 30% progress                     Intervention: Work with Elizabeth Martin to identify the major components of a recent episode of depression: physical symptoms, major thoughts and images, and major behaviors they experienced     Dates:  Start:  11/25/23                Intervention: Therapist will educate patient on cognitive distortions and the rationale for treatment of depression     Dates: Start:  11/25/23                Intervention: Elizabeth Martin will identify 2 cognitive distortions they are currently using and write reframing statements to replace them     Dates: Start:  11/25/23                Intervention: Coping Skills      Dates: Start:  11/25/23       Description: Will work with the pt using CBT/DBT techniques to help the pt verbalize an understanding of the cognitive, physiological, and behavioral components of depression and its treatment. This will be done by  using worksheets, interactive activities, CBT/ABC thought logs, modeling, homework, role playing and journaling. Will work with pt to learn and implement coping skills that result in a reduction of depression and improve daily functioning per pt self report 3 out of 5 documented sessions.                   Template: PTSD (OP)         Problem: BH CCP Acute or Chronic Trauma Reaction     Dates: Start:  11/25/23       Disciplines: Interdisciplinary, PROVIDER        Goal: LTG: Elimination of maladaptive behaviors and thinking patterns which interfere with resolution of trauma as evidenced by self report     Dates: Start:  11/25/23    Expected End:  04/24/24       Disciplines: Interdisciplinary, PROVIDER         Outcomes     Date/Time User Outcome    03/20/24 1105 Elizabeth Martin P Progressing            Goal note from Appointment 03/20/2024 by Dereck Leep, LCSW     03/20/24: 70% progress; "As far as my family issues I am doing pretty good." Shares she is fearful of her reactiveness but states she is getting better at coping those thoughts. Would like to understand how to address misplaced blame related to how she raised her children.                     Goal: LTG: Develop and implement effective coping skills to carry out normal  responsibilities and participate constructively in relationships as evidenced by self report     Dates: Start:  11/25/23    Expected End:  04/24/24       Disciplines: Interdisciplinary, PROVIDER         Outcomes     Date/Time User Outcome    03/20/24 1105 Elizabeth Martin P Progressing            Goal note from Appointment 03/20/2024 by Renee Ramus B, LCSW     03/20/24: 95% progress; "It takes me a while to think of what I need to do."                     Goal: LTG: Recall traumatic events without becoming overwhelmed with negative emotions     Dates: Start:  11/25/23    Expected End:  04/24/24       Disciplines: Interdisciplinary, PROVIDER         Outcomes     Date/Time User Outcome    03/20/24 1105 Elizabeth Martin P Progressing            Goal note from Appointment 03/20/2024 by Renee Ramus B, LCSW     03/20/24: 75% progress cites a few instances she became overwhelmed "but not much."                     Goal: LTG: Pt reports "think about these things in the past without having such an impact on my life."      Dates: Start:  11/25/23    Expected End:  04/24/24       Disciplines: Interdisciplinary, PROVIDER         Outcomes     Date/Time User Outcome    03/20/24 1105 Elizabeth Martin P Progressing            Goal note from Appointment 03/20/2024 by  [name], LCSW     03/20/24: 60% progress "I think I'm getting better at coping as they come up, but it's because I am able to handle them better."                   ProgressTowards Goals: Progressing  Interventions: Strength-based and Supportive  Summary: Elizabeth Martin is a 63 y.o. female who presents with symptoms of depression and trauma. Patient identifies symptoms to include reexperiencing, uncontrollable worry, and hypervigilance. Pt was oriented times 5. Pt was cooperative and engaged. Pt denies SI/HI/AVH.   The clinician used the therapeutic session to review the patient's progress in therapy by discussing  her treatment plan. The patient reported feeling that she has made significant strides in managing her depressive symptoms, but expressed a desire to continue working on managing her trauma symptoms. She noted a decrease in the frequency of her trauma symptoms and identified successful coping skills she has been using to manage them.  The clinician readministered the PHQ-9, GAD-7, and PCL-5 assessments. The patient's scores for depressive symptoms decreased from 23 to 5, and her anxiety symptoms decreased from 20 to 3. On the PCL-5 trauma symptoms screener, she scored a 13.  During the session, they reflected on the goals the patient wants to address in future sessions, including her struggles with chronic pain. The clinician explored the patient's readiness to begin EMDR treatment to address this chronic pain, and the patient consented to this approach. Additionally, she expressed a desire to work on addressing feelings of misplaced blame in relation to her daughters and the impact of her trauma history on their relationships. The clinician and patient briefly discussed this topic, aiming to challenge the patient's negative internal dialogue.  Suicidal/Homicidal: Nowithout intent/plan  Therapist Response: Clinician used active and supportive reflection to create a safe environment for patient to process recent life stressors. Clinician assessed for current symptoms, stressors, safety since last session.  Patient and clinician reflected on patient's progress since beginning therapy.   Plan: Return again in 2 weeks.  Diagnosis: Recurrent major depressive disorder, in partial remission (HCC)  GAD (generalized anxiety disorder)  PTSD (post-traumatic stress disorder)   Collaboration of Care: AEB psychiatrist can access notes and cln. Will review psychiatrists' notes. Check in with the patient and will see LCSW per availability. Patient agreed with treatment recommendations.   Patient/Guardian was  advised Release of Information must be obtained prior to any record release in order to collaborate their care with an outside provider. Patient/Guardian was advised if they have not already done so to contact the registration department to sign all necessary forms in order for us  to release information regarding their care.   Consent: Patient/Guardian gives verbal consent for treatment and assignment of benefits for services provided during this visit. Patient/Guardian expressed understanding and agreed to proceed.   [name], LCSW 03/20/2024

## 2024-03-21 ENCOUNTER — Ambulatory Visit: Admitting: Occupational Therapy

## 2024-03-21 ENCOUNTER — Ambulatory Visit

## 2024-03-21 DIAGNOSIS — M25551 Pain in right hip: Secondary | ICD-10-CM

## 2024-03-21 DIAGNOSIS — I890 Lymphedema, not elsewhere classified: Secondary | ICD-10-CM | POA: Diagnosis not present

## 2024-03-21 DIAGNOSIS — R262 Difficulty in walking, not elsewhere classified: Secondary | ICD-10-CM

## 2024-03-21 DIAGNOSIS — M25651 Stiffness of right hip, not elsewhere classified: Secondary | ICD-10-CM

## 2024-03-21 NOTE — Therapy (Signed)
 OUTPATIENT PHYSICAL THERAPY TREATMENT    Patient Name: Elizabeth Martin MRN: [id number] DOB:[birth year], 63 y.o., female Today's Date: 03/21/2024  END OF SESSION:  PT End of Session - 03/21/24 1017     Visit Number 14    Number of Visits 35    Date for PT Re-Evaluation 06/04/24    Authorization Type TRIC[employer]    Progress Note Due on Visit 20    PT Start Time 1017    PT Stop Time 1100    PT Time Calculation (min) 43 min    Equipment Utilized During Treatment Gait belt    Activity Tolerance Patient tolerated treatment well;No increased pain;Other (comment)   quick to faitgue in RLE  mm   Behavior During Therapy [facility] for tasks assessed/performed               Past Medical History:  Diagnosis Date   Anxiety    Asthma    Autoimmune disease (HCC)    Cancer (HCC)    Endometrial lymph node removal (30)   Corneal abrasion, right 05/24/2022   Depression    GERD (gastroesophageal reflux disease)    H/O blood clots    upper rt groin and behind both knees   History of hiatal hernia    Hypertension    Lymphedema    right leg   Morphea scleroderma    PONV (postoperative nausea and vomiting)    states gets violently ill   Past Surgical History:  Procedure Laterality Date   BASAL CELL CARCINOMA EXCISION Left    CHOLECYSTECTOMY  2008   FLEXIBLE SIGMOIDOSCOPY N/A 05/19/2022   Procedure: FLEXIBLE SIGMOIDOSCOPY;  Surgeon: Toledo, Boykin Nearing, MD;  Location: [facility] ENDOSCOPY;  Service: Gastroenterology;  Laterality: N/A;   HERNIA REPAIR  2004   ILEOSTOMY CLOSURE N/A 08/24/2022   Procedure: ILEOSTOMY TAKEDOWN, open loop with Lynden Oxford, PA-C to assist;  Surgeon: Henrene Dodge, MD;  Location: [facility] ORS;  Service: General;  Laterality: N/A;   IVC FILTER INSERTION N/A 11/09/2023   Procedure: IVC FILTER INSERTION;  Surgeon: Annice Needy, MD;  Location: [facility] INVASIVE CV LAB;  Service: Cardiovascular;  Laterality: N/A;   IVC FILTER REMOVAL N/A 01/02/2024   Procedure: IVC FILTER REMOVAL;   Surgeon: Annice Needy, MD;  Location: [facility] INVASIVE CV LAB;  Service: Cardiovascular;  Laterality: N/A;   PLANTAR FASCIECTOMY     ROBOTIC ASSISTED TOTAL HYSTERECTOMY WITH BILATERAL SALPINGO OOPHERECTOMY  02/14/2012   ROTATOR CUFF REPAIR Left 03/24/2022   TONSILLECTOMY     TOTAL HIP ARTHROPLASTY Right 11/14/2023   Procedure: TOTAL HIP ARTHROPLASTY - posterior;  Surgeon: Reinaldo Berber, MD;  Location: [facility] ORS;  Service: Orthopedics;  Laterality: Right;   XI ROBOTIC ASSISTED LOWER ANTERIOR RESECTION N/A 05/24/2022   Procedure: XI ROBOTIC ASSISTED LOWER ANTERIOR RESECTION;  Surgeon: Henrene Dodge, MD;  Location: [facility] ORS;  Service: General;  Laterality: N/A;   Patient Active Problem List   Diagnosis Date Noted   Constipation 01/10/2024   Nausea & vomiting 01/10/2024   S/P total right hip arthroplasty 11/14/2023   Blood coagulation disorder (HCC) 11/10/2023   PTSD (post-traumatic stress disorder) 11/10/2023   Severe episode of recurrent major depressive disorder, without psychotic features (HCC) 11/10/2023   GAD (generalized anxiety disorder) 11/10/2023   High risk medication use 11/10/2023   Anxiety and depression 03/22/2023   Cervical radiculopathy 03/16/2023   Colon cancer screening 03/15/2023   Chronic idiopathic constipation 03/15/2023   Dyspnea 03/07/2023   [facility] fever 03/07/2023  Strain of gastrocnemius tendon 03/07/2023   Lower abdominal pain 03/07/2023   Pain in pelvis 03/07/2023   Small bowel obstruction (HCC) 02/11/2023   Raynaud disease 02/11/2023   Polyarthralgia 02/11/2023   Edema 01/03/2023   Diverticular disease 12/10/2022   Hot flashes 12/10/2022   Pain of right lower extremity 12/10/2022   Paresthesia of lower extremity 12/10/2022   Primary insomnia 12/10/2022   Thyroid nodule 12/10/2022   Multiple joint pain 12/10/2022   Basal cell carcinoma of skin 12/10/2022   Acute bilateral low back pain without sciatica 11/10/2022   Chronic lower back  pain 09/05/2022   S/P closure of ileostomy 08/24/2022   Ileostomy in place [facility])    Morphea 07/13/2022   Colonic stricture (HCC) 05/19/2022   Large bowel obstruction (HCC) 05/19/2022   Hypokalemia 05/18/2022   Abdominal pain 05/18/2022   Osteoarthritis of left knee 02/24/2022   Mixed connective tissue disease (HCC) 02/17/2022   Adhesive capsulitis of left shoulder 01/27/2022   Glenohumeral arthritis, left 01/27/2022   Microscopic hematuria 01/12/2022   Otorrhagia of right ear 12/10/2021   Elevated testosterone level in female 11/04/2021   Anti-TPO antibodies present 10/20/2021   Rash 10/13/2021   Hashimoto thyroiditis, 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   Hashimoto's disease 03/23/2021   Exposure to severe acute respiratory syndrome coronavirus 2 (SARS-CoV-2) 12/19/2020   Migraine without aura and without status migrainosus, not intractable 08/19/2020   Myofascial pain 07/30/2020   Neck pain 07/30/2020   Upper back pain 07/30/2020   Cervical facet joint syndrome 07/30/2020   Hair loss 07/14/2020   Edema of lower extremity 07/11/2020   Adenomatous colon polyp 02/26/2020   Gastroesophageal reflux disease 02/15/2020   Diarrhea 02/15/2020   Sleep disorder 01/16/2020   Hyperlipidemia 07/11/2019   Right shoulder pain 04/04/2019   Pre-operative clearance 12/13/2018   Endometrial cancer (HCC) 04/05/2018   Patellofemoral pain syndrome of right knee 03/20/2018   Gastroenteritis 12/07/2017   Fatigue 11/16/2017   Morbid obesity (HCC) 08/11/2017   Stucco keratoses 08/11/2017   Essential hypertension 08/09/2017   History of endometrial cancer 08/09/2017   Lymphedema 08/09/2017   Menopause 08/09/2017   Malignant neoplastic disease (HCC) 04/07/2017   Cellulitis 09/18/2016   Benign hypertension 06/02/2016   Asthma 06/04/1969    PCP: Pennie Banter MD   REFERRING PROVIDER: Pennie Banter MD   REFERRING DIAG: s/p R hip replacement  THERAPY DIAG:  Stiffness of right hip, not elsewhere  classified  Pain in right hip  Difficulty in walking, not elsewhere classified  Rationale for Evaluation and Treatment: Rehabilitation  ONSET DATE: 11/14/23  SUBJECTIVE:   SUBJECTIVE STATEMENT:    Pt reports 4/10 soreness in the R hip.  Pt notes it feels it's worse when sitting for a prolonged period of time.     PERTINENT HISTORY: s/p R hip arthropaslasty 11/14/23 with Dr. Audelia Acton posterior approach, blood coagulation disorder, PTSD, anxiety, cervical radiculopathy dypsnea, polyarthralgia, Raynaud disease, s/p closure of ileostomy, Hashimoto thyroid, endometrial cancer, menopause, endometrial cancer, lymphedema. Patient had home health PT prior to PT here.   PAIN:  Are you having pain?  4/10 Rt hip joint pain, posterior portion of hip.    PRECAUTIONS: Other: recovering from hip sx  RED FLAGS: None   WEIGHT BEARING RESTRICTIONS: No  FALLS:  Has patient fallen in last 6 months? Yes. Number of falls 2  LIVING ENVIRONMENT: Lives with: lives with their spouse Lives in: House/apartment Stairs: No Has following equipment at home: Single point cane  OCCUPATION: retired,  PLOF: Independent  PATIENT GOALS: return to hiking, working out, gardening.   NEXT MD VISIT: March 2025   OBJECTIVE:  Note: Objective measures were completed at Evaluation unless otherwise noted.  DIAGNOSTIC FINDINGS:   IMPRESSION: Right hip replacement.  No visible complicating feature.  PATIENT SURVEYS:  LEFS 5/80  COGNITION: Overall cognitive status: Within functional limits for tasks assessed    MUSCLE LENGTH: Hamstrings: limited bilaterally with R>L   LOWER EXTREMITY ROM:  Passive ROM Right eval Left eval  Hip flexion 48 flexion PROM supine   Hip extension    Hip abduction 5 degrees AROM     (Blank rows = not tested)  LOWER EXTREMITY MMT:  MMT Right eval Left eval  Hip flexion 2+ 5  Hip extension  5  Hip abduction 2+* painful 5  Hip adduction 2+ 5  Hip internal rotation     Hip external rotation    Knee flexion 3 5  Knee extension 3 5  Ankle dorsiflexion 3 5  Ankle plantarflexion 3 5   (Blank rows = not tested)    FUNCTIONAL TESTS:  5 times sit to stand: 29 6 minute walk test:  10 meter walk test: 15 seconds with SPC   GAIT: Distance walked: 60 ft Assistive device utilized: Single point cane Level of assistance: CGA Comments: antalgic gait pattern with decreased stance time RLE. Slight vaulting pattern.  Stair negotiation: -step to pattern lead with LLE, one hand on cane one hand on rail ascending. Descending step to pattern down with RLE.                                                                                                                                 TREATMENT DATE: 03/21/24  TA to improve BLE ROM and allow improved independence with ADLs and community mobility.    Seated:  Hip circles (with leg straight) x 30 second bouts CW/CCW each direction LAQ with 5# AW donned each LE  Seated hamstring stretch, 30 sec bout each LE  Sit to stand 2 x 10 with no UE support   Supine resisted hip abduction with GTB around knee, 2x10 each LE Prone hip extension with straight leg, x10 each LE Prone hip extension with bent knee, x10 each LE  AAROM necessary for the prone exercises on the R LE to achieve greater ROM    Manual performed to improve pain and tolerance to exercises in the posterior chain of the hip  Supine hamstring stretch, 30 sec bout x2 each LE Prone STM with TP release to piriformis and gluteal region for pain modulation and reduction of sciatic symptoms, used passive IR/ER while performing as well to improve tolerance     PATIENT EDUCATION:  Education details: exercise technique Person educated: Patient Education method: Explanation, Demonstration, Tactile cues, and Verbal cues Education comprehension: verbalized understanding, returned demonstration, verbal cues required, tactile cues required, and needs further  education  HOME EXERCISE PROGRAM:  Access Code: [facility] URL: [email or web address] Date: 02/08/2024; updated 03/21/2024 Prepared by: [employer]  Exercises - Side Stepping with Counter Support  - 1 x daily - 7 x weekly - 2 sets - 10 reps - 5 hold - Standing Hip Extension  - 1 x daily - 7 x weekly - 2 sets - 10 reps - 5 hold - Standing March with Counter Support  - 1 x daily - 7 x weekly - 2 sets - 10 reps - 5 hold - Standing Hip Abduction with Counter Support  - 1 x daily - 7 x weekly - 2 sets - 10 reps - Seated Hamstring Stretch  - 1 x daily - 7 x weekly - 2 sets - 2 reps - 30 hold - Sit to Stand  - 1 x daily - 7 x weekly - 2 sets - 10 reps - 5 hold - Supine Heel Slide  - 1 x daily - 7 x weekly - 2 sets - 6 reps - Supine Gluteal Sets  - 1 x daily - 7 x weekly - 2 sets - 10 reps - Supine Bridge  - 1 x daily - 7 x weekly - 3 sets - 5 reps - 2-3sec hold - Supine Short Arc Quad  - 1 x daily - 7 x weekly - 3 sets - 15 reps - Small Range Straight Leg Raise  - 1 x daily - 7 x weekly - 3 sets - 5 reps - Hooklying Clamshell with Resistance  - 1 x daily - 7 x weekly - 3 sets - 15 reps - Prone Hip Extension with Bent Knee  - 1 x daily - 7 x weekly - 3 sets - 10 reps - Prone Hip Extension Sequence  - 1 x daily - 7 x weekly - 3 sets - 10 reps  ASSESSMENT: CLINICAL IMPRESSION:    Pt responded well to the treatment today and put forth good effort throughout the session.  Pt initially having difficulty with pressure applied by therapist during manual therapy, specifically with the addition of the IR/ER of the hip, but as therapist continued, pt was able to tolerate improved ROM and tolerance to pressure.  Pt ultimately having some signs and symptoms of sciatica and would benefit from performing exercises in future sessions to assist with this complication.   Pt will continue to benefit from skilled therapy to address remaining deficits in order to improve overall QoL and  return to PLOF.      OBJECTIVE IMPAIRMENTS: Abnormal gait, decreased activity tolerance, decreased balance, decreased coordination, decreased endurance, decreased mobility, difficulty walking, decreased ROM, decreased strength, hypomobility, increased edema, impaired perceived functional ability, impaired flexibility, improper body mechanics, postural dysfunction, and pain.   ACTIVITY LIMITATIONS: carrying, lifting, bending, sitting, standing, squatting, sleeping, stairs, transfers, bed mobility, bathing, toileting, dressing, reach over head, hygiene/grooming, locomotion level, and caring for others  PARTICIPATION LIMITATIONS: meal prep, cleaning, laundry, interpersonal relationship, driving, shopping, community activity, and yard work  PERSONAL FACTORS: Age, Past/current experiences, Time since onset of injury/illness/exacerbation, and 3+ comorbidities: blood coagulation disorder, PTSD, anxiety, cervical radiculopathy dypsnea, polyarthralgia, Raynaud disease, s/p closure of ileostomy, Hashimoto thyroid, endometrial cancer, menopause, endometrial cancer, lymphedema  are also affecting patient's functional outcome.   REHAB POTENTIAL: Good  CLINICAL DECISION MAKING: Evolving/moderate complexity  EVALUATION COMPLEXITY: Moderate   GOALS: Goals reviewed with patient? Yes  SHORT TERM GOALS: Target date: 01/19/2024  Patient will be independent in home exercise program to improve strength/mobility for better functional  independence with ADLs. Baseline:1/30: HEP given 3/12: pt reports completing 3-4 days a week depending on soreness.  Goal status:  MET  LONG TERM GOALS: Target date: 06/04/2024  Patient (> 59 years old) will complete five times sit to stand test in < 10 seconds indicating an increased LE strength and improved balance. Baseline: 1/30: 29 seconds 3/12: 19.41 03/12/2024= 17.44 seconds- without UE support Goal status: PROGRESSING  2.  Patient will increase six minute walk test  distance to >1000 for progression to community ambulator and improve gait ability Baseline: 430 ft in 5 min and required seated rest to end test due to fatigue, uses SPC 874ft with SPC and no standing or seated rest break; 03/12/2024= 950 feet with SPC Goal status: in progress   3.  Patient will increase 10 meter walk test to >1.57m/s as to improve gait speed for better community ambulation and to reduce fall risk. Baseline: 1/30: 15 seconds with SPC  Without SPC: 10.63 and 10.38 speed: 0.95 m/s; with SPC 10.98 and 11.18; 03/12/2024= 1.0 m/s  Goal status: PROGRESSING  4.  Patient will increase lower extremity functional scale to >60/80 to demonstrate improved functional mobility and increased tolerance with ADLs.  Baseline: 1/30: 5/80 3/12: 20/80; 03/12/2024= 29/80 Goal status: Progressing  PLAN:  PT FREQUENCY: 1-2x/week PT DURATION: 12 weeks PLANNED INTERVENTIONS: 97164- PT Re-evaluation, 97110-Therapeutic exercises, 97530- Therapeutic activity, 97112- Neuromuscular re-education, 97535- Self Care, 96295- Manual therapy, 97116- Gait training, [phone]- Splinting, 97014- Electrical stimulation (unattended), [phone]- Electrical stimulation (manual), 97035- Ultrasound, [id number]- Traction (mechanical), Patient/Family education, Balance training, Stair training, Taping, Joint mobilization, Joint manipulation, Spinal manipulation, Spinal mobilization, Manual lymph drainage, Scar mobilization, Compression bandaging, Vestibular training, Visual/preceptual remediation/compensation, DME instructions, Cryotherapy, and Moist heat  PLAN FOR NEXT SESSION:   Additional sciatic nerve flossing/exercises Dynamic gait and  R hip strength and ROM,     Rozanna Corner, PT, DPT Physical Therapist - [facility]  [facility]  03/21/24, 12:33 PM

## 2024-03-21 NOTE — Therapy (Signed)
 OUTPATIENT OCCUPATIONAL THERAPY TREATMENT NOTE  LOWER EXTREMITY LYMPHEDEMA  Patient Name: Elizabeth Martin MRN: [id number] DOB:1961/03/06, 63 y.o., female Today's Date: 03/21/2024  REPORTING PERIOD:   END OF SESSION:  Lymphedema Episode 2   OT End of Session - 03/21/24 1228     Visit Number 23    Number of Visits 36    Date for OT Re-Evaluation 04/16/24    OT Start Time 1100    OT Stop Time 1205    OT Time Calculation (min) 65 min    Activity Tolerance Patient tolerated treatment well;No increased pain    Behavior During Therapy WFL for tasks assessed/performed              Past Medical History:  Diagnosis Date   Anxiety    Asthma    Autoimmune disease (HCC)    Cancer (HCC)    Endometrial lymph node removal (30)   Corneal abrasion, right 05/24/2022   Depression    GERD (gastroesophageal reflux disease)    H/O blood clots    upper rt groin and behind both knees   History of hiatal hernia    Hypertension    Lymphedema    right leg   Morphea scleroderma    PONV (postoperative nausea and vomiting)    states gets violently ill   Past Surgical History:  Procedure Laterality Date   BASAL CELL CARCINOMA EXCISION Left    CHOLECYSTECTOMY  2008   FLEXIBLE SIGMOIDOSCOPY N/A 05/19/2022   Procedure: FLEXIBLE SIGMOIDOSCOPY;  Surgeon: Toledo, Alphonsus Jeans, MD;  Location: [facility];  Service: Gastroenterology;  Laterality: N/A;   HERNIA REPAIR  2004   ILEOSTOMY CLOSURE N/A 08/24/2022   Procedure: ILEOSTOMY TAKEDOWN, open loop with Apolonio Bay, PA-C to assist;  Surgeon: Emmalene Hare, MD;  Location: [facility];  Service: General;  Laterality: N/A;   IVC FILTER INSERTION N/A 11/09/2023   Procedure: IVC FILTER INSERTION;  Surgeon: Celso College, MD;  Location: [facility];  Service: Cardiovascular;  Laterality: N/A;   IVC FILTER REMOVAL N/A 01/02/2024   Procedure: IVC FILTER REMOVAL;  Surgeon: Celso College, MD;  Location: [facility];  Service:  Cardiovascular;  Laterality: N/A;   PLANTAR FASCIECTOMY     ROBOTIC ASSISTED TOTAL HYSTERECTOMY WITH BILATERAL SALPINGO OOPHERECTOMY  02/14/2012   ROTATOR CUFF REPAIR Left 03/24/2022   TONSILLECTOMY     TOTAL HIP ARTHROPLASTY Right 11/14/2023   Procedure: TOTAL HIP ARTHROPLASTY - posterior;  Surgeon: Venus Ginsberg, MD;  Location: [facility];  Service: Orthopedics;  Laterality: Right;   XI ROBOTIC ASSISTED LOWER ANTERIOR RESECTION N/A 05/24/2022   Procedure: XI ROBOTIC ASSISTED LOWER ANTERIOR RESECTION;  Surgeon: Emmalene Hare, MD;  Location: [facility];  Service: General;  Laterality: N/A;   Patient Active Problem List   Diagnosis Date Noted   Constipation 01/10/2024   Nausea & vomiting 01/10/2024   S/P total right hip arthroplasty 11/14/2023   Blood coagulation disorder (HCC) 11/10/2023   PTSD (post-traumatic stress disorder) 11/10/2023   Severe episode of recurrent major depressive disorder, without psychotic features (HCC) 11/10/2023   GAD (generalized anxiety disorder) 11/10/2023   High risk medication use 11/10/2023   Anxiety and depression 03/22/2023   Cervical radiculopathy 03/16/2023   Colon cancer screening 03/15/2023   Chronic idiopathic constipation 03/15/2023   Dyspnea 03/07/2023   [facility] spotted fever 03/07/2023   Strain of gastrocnemius tendon 03/07/2023   Lower abdominal pain 03/07/2023   Pain in pelvis 03/07/2023   Small bowel  obstruction (HCC) 02/11/2023   Raynaud disease 02/11/2023   Polyarthralgia 02/11/2023   Edema 01/03/2023   Diverticular disease 12/10/2022   Hot flashes 12/10/2022   Pain of right lower extremity 12/10/2022   Paresthesia of lower extremity 12/10/2022   Primary insomnia 12/10/2022   Thyroid nodule 12/10/2022   Multiple joint pain 12/10/2022   Basal cell carcinoma of skin 12/10/2022   Acute bilateral low back pain without sciatica 11/10/2022   Chronic lower back pain 09/05/2022   S/P closure of ileostomy 08/24/2022   Ileostomy in  place [facility])    Morphea 07/13/2022   Colonic stricture (HCC) 05/19/2022   Large bowel obstruction (HCC) 05/19/2022   Hypokalemia 05/18/2022   Abdominal pain 05/18/2022   Osteoarthritis of left knee 02/24/2022   Mixed connective tissue disease (HCC) 02/17/2022   Adhesive capsulitis of left shoulder 01/27/2022   Glenohumeral arthritis, left 01/27/2022   Microscopic hematuria 01/12/2022   Otorrhagia of right ear 12/10/2021   Elevated testosterone level in female 11/04/2021   Anti-TPO antibodies present 10/20/2021   Rash 10/13/2021   Hashimoto thyroiditis, 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   Hashimoto's disease 03/23/2021   Exposure to severe acute respiratory syndrome coronavirus 2 (SARS-CoV-2) 12/19/2020   Migraine without aura and without status migrainosus, not intractable 08/19/2020   Myofascial pain 07/30/2020   Neck pain 07/30/2020   Upper back pain 07/30/2020   Cervical facet joint syndrome 07/30/2020   Hair loss 07/14/2020   Edema of lower extremity 07/11/2020   Adenomatous colon polyp 02/26/2020   Gastroesophageal reflux disease 02/15/2020   Diarrhea 02/15/2020   Sleep disorder 01/16/2020   Hyperlipidemia 07/11/2019   Right shoulder pain 04/04/2019   Pre-operative clearance 12/13/2018   Endometrial cancer (HCC) 04/05/2018   Patellofemoral pain syndrome of right knee 03/20/2018   Gastroenteritis 12/07/2017   Fatigue 11/16/2017   Morbid obesity (HCC) 08/11/2017   Stucco keratoses 08/11/2017   Essential hypertension 08/09/2017   History of endometrial cancer 08/09/2017   Lymphedema 08/09/2017   Menopause 08/09/2017   Malignant neoplastic disease (HCC) 04/07/2017   Cellulitis 09/18/2016   Benign hypertension 06/02/2016   Asthma 06/04/1969    PCP: Eather Golder, MD  REFERRING PROVIDER: Eather Golder, MD  REFERRING DIAG: I89.0   THERAPY DIAG:  Lymphedema, not elsewhere classified  Rationale for Evaluation and Treatment: Rehabilitation  ONSET DATE: 2013   (Cancer-related, endometrial 2013)  SUBJECTIVE:                                                                                                                                                                                           SUBJECTIVE STATEMENT:Patient returns to OT for lymphedema  care after PT session. Pt states she is sore and winces when changing position during session. Pt does not rate pain numerically.   PERTINENT HISTORY: Asthma, Autoimmune Disease (Connective Tissue Disease), Endometrial Ca w/ LND ( 30 bilateral pelvic and periaortic LN), adjuvant chemotherapy and XRT,   H/O blood clots, RLE/RLQ lymphedema, Robotic assisted total hysterectomy w/ bilateral oophorectomy L Rotator Cuff repair, S/P ileostomy 2/2 colon stricture 05/2022; s/p R hip arthroplasty 11/11/23.   PAIN:  Are you having pain? YES. See subjective; not rated /10 Pain location: RLE, R hip, cervical spine Pain description: sore, aching, heavy, full, tight,  Aggravating factors: standing, walking, extended dependent sitting Relieving factors: elevation, movement  PRECAUTIONS: Fall and Other: LYMPHEDEMA  WEIGHT BEARING RESTRICTIONS: Yes 5# lifting restriction- shoulder  FALLS:  Has patient fallen in last 6 months? No  LIVING ENVIRONMENT: Lives with: lives with their spouse Lives in: House/apartment Stairs: No;  Has following equipment at home: Single point cane, Environmental consultant - 2 wheeled, Environmental consultant - 4 wheeled, and Wheelchair (manual)  OCCUPATION: retired Control and instrumentation engineer: gardening, hiking, biking, dancing- unable to participate in any of these due to pain and swelling  HAND DOMINANCE: right   PRIOR LEVEL OF FUNCTION: Independent with basic ADLs, Independent with household mobility without device, Independent with community mobility without device, Requires assistive device for independence, Needs assistance with homemaking, Needs assistance with transfers, and Leisure: decreased  social participation for  leisure pursuits due to impaired mobility and pain  PATIENT GOALS:  Be able to move freely without pain Reduce limb volume to be able to lift limb for basic daily ADLs and functional ambulation Reduce limb volume to increase ability to perform lower body dressing and bathing and grooming Reduce limb to increase body image  OBJECTIVE: Note: Objective measures were completed at Evaluation unless otherwise noted.  COGNITION:  Overall cognitive status: Within functional limits for tasks assessed   OBSERVATIONS / OTHER ASSESSMENTS:   POSTURE: WFL  LE ROM: WFL, but Limited mildly at R knee and ankle due to girth, skin approximation, and joint pain  LE MMT: WFL  Mild, Stage  II, Bilateral Lower Extremity Lymphedema 2/2 CVI and Obesity  Skin  Description Hyper-Keratosis Peau d' Orange Shiny Tight Fibrotic/ Indurated Fatty Doughy Spongy/ boggy   x x x x R>L Severe morphea at  R groin   x   Skin dry Flaky WNL Macerated   mildly sclerotic     Color Redness Varicosities Blanching Hemosiderin Stain Mottled   x x x   x   Odor Malodorous Yeast Fungal infection  WNL      x   Temperature Warm Cool wnl    x     Pitting Edema   1+ 2+ 3+ 4+ Non-pitting         x   Girth Symmetrical Asymmetrical                   Distribution    R>L RLE toes to groin    Stemmer Sign Positive Negative   +    Lymphorrhea History Of:  Present Absent     x    Wounds History Of Present Absent Venous Arterial Pressure Sheer     x        Signs of Infection Redness Warmth Erythema Acute Swelling Drainage Borders                    Sensation Light Touch Deep pressure Hypersensitivity   In tact  Impaired In tact Impaired Absent Impaired   x  x  x     Nails WNL   Fungus nail dystrophy   x     Hair Growth Symmetrical Asymmetrical    R>L   Skin Creases Base of toes  Ankles   Base of Fingers knees       Abdominal pannus Thigh Lobules  Face/neck   x x  x        BLE COMPARATIVE  LIMB VOLUMETRICS 10/10/23  LANDMARK RIGHT  10/10/23  R LEG (A-D) 5466.5 ml  R THIGH (E-G) 9186.6 ml  R FULL LIMB (A-G) 14653.1 ml  Limb Volume differential (LVD)  LVD for LEG = 44.1%, R>L LVD for THIGH= 33.98%, R>L LVD for FULL lower extremity 37.8%, R>L  Volume change since last 08/11/22 R LEG is INCREASED in volume by 59.6%. L THIGH is INCREASED by 38 %, and RLE full limb is INCREASED by 45.38%.  Volume change overall V  (Blank rows = not tested)  LANDMARK LEFT  10/12/23  L LEG (A-D) 3053.6 ml  L THIGH (E-G) 6064.8 ml  L  FULL LIMB (A-G) 9118.4 ml  Limb Volume differential (LVD)  %  Volume change since initial %  Volume change overall %  (Blank rows = not tested)   10 th VISIT PROGRESS NOTE RLE COMPARATIVE LIMB VOLUMETRICS 01/04/24: Deferred until next visit by Pt request.  LANDMARK RIGHT    R LEG (A-D) TBA  R THIGH (E-G) TBA  R FULL LIMB (A-G) TBA  Limb Volume differential (LVD)    Volume change since last 08/11/22 TBA  Volume change overall TBA  (Blank rows = not tested)  20 th VISIT PROGRESS NOTE RLE COMPARATIVE LIMB VOLUMETRICS TBA next session  LANDMARK RIGHT    R LEG (A-D) 6005.7 ml  R THIGH (E-G) 9400.3 ml  R FULL LIMB (A-G) 15406.02 ml  Limb Volume differential (LVD)    Volume change since last measured on 10/10/23 R LEG is INCREASED in volume by 9.9%. L THIGH is INCREASED by 2.32 %, and RLE full limb is INCREASED by 5.13% since last measured on 10/10/23.  Volume change since commencing OT for CDT %   ENDOMETRIAL CA-related LYMPHEDEMA Hx:  SURGERY TYPE/DATE: 2013 NUMBER OF LYMPH NODES REMOVED: 30 by report- bilateral pelvic and periaortic CHEMOTHERAPY: yes RADIATION:yes INFECTIONS: no hx cellulitis GAIT: Distance walked: >500 ft Assistive device utilized: single point cane Level of assistance: Modified independence- extra time Comments: R limp  LYMPHEDEMA LIFE IMPACT SCALE (LLIS): Intake 10/12/23 75% (The extent to which lymphedema related problems impacted your  life over the past week)  FOTO (functional outcome measure):10/10/23 INTAKE: 36% No Out take score. FOTO assessment discontinued by clinic.  PATIENT EDUCATION:  Education details: Continued Pt/ CG edu for lymphedema self care home program throughout session. Topics include outcome of comparative limb volumetrics- starting limb volume differentials (LVDs), technology and gradient techniques used for short stretch, multilayer compression wrapping, simple self-MLD, therapeutic lymphatic pumping exercises, skin/nail care, LE precautions,. compression garment recommendations and specifications, wear and care schedule and compression garment donning / doffing w assistive devices. Discussed progress towards all OT goals since commencing CDT. All questions answered to the Pt's satisfaction. Good return. Person educated: Patient Education method: Explanation, Demonstration, and Handouts Education comprehension: verbalized understanding, returned demonstration, and needs further education  LE SELF-CARE HOME  PROGRAM: Simple self-MLD/daily to affected quadrant and body part At least 2 x daily BLE Lymphatic Pumping There ex 1 set of 10  reps each, in order, bilaterally Daily skin care to affected body part to limit infection risk and increase skin excursion Compression Bandaging Intensive stage compression: multilayer short stretch wraps with gradient techniques. One limb at a time. Length patient dependent. Self-management Phase: Appropriate daytime compression garment and hours-of-sleep device  Compression garments: Custom-made gradient compression garments and hours-of-sleep devices are medically necessary because they are uniquely sized and shaped to fit the exact dimensions of the affected extremities and to provide accurate and consistent gradient compression and containment, essential  for optimally managing chronic, progressive lymphedema. The convoluted HOS devices are medically necessary to facilitate  increased lymphatic circulation and limit fibrosis formation when sleeping. Multiple custom compression garments are needed for optimal hygiene to limit infection risk. Custom compression garments should be replaced q 3-6 months When worn consistently for optimal lipo-lymphedema self-management over time.  Pt will benefit from [id number]- Circaid reduction kit whole leg to increase independence with lymphedema self care, to  reduce limb volume  for body symmetry and balance, and to limit infection risk and progression.  ASSESSMENT:  CLINICAL IMPRESSION:  Progress is slow. Lymphedema management  with combination of progressive, secondary lymphedema with connective tissue disease is very stubborn and challenging.  R thigh has softened  marginally, but leg and ankle remain very hard and tight with dense    congestion. Pt tolerated MLD to RLE/RLQ without increased pain. We also used deep fibrosis techniques to distal leg and medial malleolus with palpable softening in tissue density after session.Applied multilayer compression wraps as established using gradient techniques.  Cont as per POC.  10/10/23 Lymphedema Episode 2, Initial OT Evaluation : Elizabeth Martin is a 22 y a female presenting with chronic, progressive, moderate, Stage II, RLE/RLQ cancer -related lymphedema with onset 11 years ago after Rx for endometrial cancer. Pt is well known to this therapist as she has successfully undergone Complete Decongestive Therapy (CDT)  this clinic bin the past. Pt reports recent exacerbation of RLE swelling worsened as inflammation in L hip has worsened over time.  Pt has a hip replacement scheduled in late December here it [facility]. RE limb volumetrics today reveal that R LEG volume is dramatically increased by 59.65 since last visit. R LEG VOLUME is INCREASED in volume by 59.6%. L THIGH is INCREASED by 38 %, and RLE full limb is INCREASED by 45.38%. Pt presents with LE-related skin changes. Prolonged inflammation in the R  hip joint has likely overloaded  already RLE/RLQ lymphatics.  RLE/RLQ lymphedema limits Pt's ability to perform basic and instrumental ADLs, including functional ambulation, mobility and transfers, grooming , lower body bathing and dressing, skin inspection and skin care. Pt has difficulty  reaching her lower legs and feet to apply compression wraps and don/doff        compression stockings. LE limits ability to P[perform instrumental ADLs, including driving, yard work and home management activities. Lymphedema limits her ability to participate in leisure pursuits and productive activities, and it negatively impacts body image, life roles and quality of life.   Pt will benefit from an Intensive and follow along course of lymphedema. CDT will consist of Manual Lymphatic gainage   (MLD), skin care, therapeutic exercise and compression, wraps initially then garments. Pt will need assistance throughout CDT   for applying compression wraps due to limited hip AROM and decreased skin flexibility. Without skilled Occupational Therapy for lymphedema care, lymphedema will progress and further functional decline is expected.   In prep for upcoming hip replacement OT will educate  Pt re assistive devices, including tub transfer bench and elevated toilet seat, in keeping with hip precautions.  OBJECTIVE IMPAIRMENTS: Abnormal gait, decreased activity tolerance, decreased balance, decreased knowledge of use of DME, decreased mobility, difficulty walking, decreased ROM, decreased strength, increased edema, decreased skin flexibility, increased fascial restrictions, impaired sensation, pain, and chronic, progressive LLE/LLQ lymphatic swelling and associated pain.   ADL LIMITATIONS: carrying, lifting, bending, sitting, standing, squatting, sleeping, stairs, transfers, bed mobility, bathing, dressing, hygiene/grooming, and productive activities, leisure pursuits, social participation, body image  driving, shopping, housework,  yard work, cooking, meal prep  PERSONAL FACTORS: Past/current experiences and 3+ comorbidities: Morphea Scleroderma, Mixed connective tissue disease, and osteoarthritis  are also affecting patient's functional outcome.   REHAB POTENTIAL: Good  EVALUATION COMPLEXITY: Moderate  GOALS: Goals reviewed with patient? Yes  SHORT TERM GOALS: Target date: 4th OT Rx visit   Pt will demonstrate understanding of lymphedema precautions and prevention strategies with modified independence using a printed reference to identify at least 5 precautions and discussing how s/he may implement them into daily life to reduce risk of progression with extra time.  Baseline:Max A Goal status: GOAL MET  2.  Pt will be able to apply multilayer, knee length, gradient, compression wraps to one leg at a time with modified assistance (extra time and assistive device/s) to decrease limb volume, to limit infection risk, and to limit lymphedema progression.  Baseline: Dependent Goal status: GOAL MET  LONG TERM GOALS: Target date: 01/09/24 (12 weeks)  Given this patient's Intake score 36% on the functional outcomes FOTO tool, patient will experience an increase in function of 3 points to improve basic and instrumental ADLs performance, including lymphedema self-care.  Baseline: Max A Goal status: DEFERRED as [employer] has been discontinued  2.  Given this patient's Intake score of  75% on the Lymphedema Life Impact Scale (LLIS), patient will experience a reduction of at least 5 points in her perceived level of functional impairment resulting from lymphedema to improve functional performance and quality of life (QOL). Baseline: % Goal status: PROGRESSING  3.  Pt will achieve at least a 10% volume reduction in full, RLE limb volume to return limb to typical size and shape, to limit infection risk and LE progression, to decrease pain, to improve function. Baseline: Dependent Goal status:PROGRESSING.  Volumetrics deferred until  next week to focus on manual therapy and relief of pain/discomfort. To date by visual assessment I would estimate no more than a 10% volume reduction. DENSITY IN THIGH HAS REDUCED only SLIGHTLY. Morphea at R groin continues to present a significant obstacle to limb volume decongestion and fibrosis reduction.  Progress is slow. Lymphedema management  with combination of progressive, secondary lymphedema with connective tissue disease is very stubborn and challenging.   4.  Pt will obtain proper compression garments/devices and achieve modified independence (extra time + assistive devices) with donning/doffing to optimize limb volume reductions and limit LE  progression over time. Baseline:  Goal status: 01/04/24: PARTIALLY MET; In an effort to reduce burden of care on her spouse, Pt obtained adjustable, Velcro style R full leg, Circaid leg reduction kit for use s/p THA, but this proved not effective for controlling swelling, and were not easier to don and doff more independently. We've resumed wrapping until she is able to fit back into custom compression garments. 02/14/24: Ongoing  5.  During Intensive phase CDT , with modified independence, Pt will achieve at least 85% compliance with all lymphedema self-care home program components, including daily skin  care, compression wraps and /or garments, simple self MLD and lymphatic pumping therex to habituate LE self care protocol  into ADLs for optimal LE self-management over time. Baseline: Dependent Goal status: 02/14/24: GOAL MET and EXCEEDED PLAN:  OT FREQUENCY: 2x/week  OT DURATION: 12 weeks and PRN  PLANNED INTERVENTIONS: Complete Decongestive Therapy (Intensive and supported Self-Management Phases), 97110-Therapeutic exercises, 97530- Therapeutic activity, 97535- Self Care, 16109- Manual therapy, Patient/Family education, Taping, Manual lymph drainage, Scar mobilization, Compression bandaging, DME instructions, and skin care to reduce infection risk  throughout manual therapy. Fit with replacement compression garments ASAP  PLAN FOR NEXT SESSION:  Complete RLE comparative limb volumetrics for progress note update Cont RLE/RLQ MLD Cont multilayer compression wrapping    Arnold Bicker, MS, OTR/L, CLT-LANA 03/21/24 12:30 PM  03/21/2024, 12:30 PM

## 2024-03-23 ENCOUNTER — Ambulatory Visit: Admitting: Occupational Therapy

## 2024-03-23 ENCOUNTER — Encounter: Payer: Self-pay | Admitting: Occupational Therapy

## 2024-03-23 DIAGNOSIS — I890 Lymphedema, not elsewhere classified: Secondary | ICD-10-CM

## 2024-03-23 NOTE — Therapy (Signed)
 OUTPATIENT OCCUPATIONAL THERAPY TREATMENT NOTE  LOWER EXTREMITY LYMPHEDEMA  Patient Name: Elizabeth Martin MRN: [id number] DOB:[birth year], 63 y.o., female Today's Date: 03/23/2024  REPORTING PERIOD:   END OF SESSION:  Lymphedema Episode 2   OT End of Session - 03/23/24 0809     Visit Number 24    Number of Visits 36    Date for OT Re-Evaluation 04/16/24    OT Start Time 0805    Activity Tolerance Patient tolerated treatment well;No increased pain    Behavior During Therapy WFL for tasks assessed/performed              Past Medical History:  Diagnosis Date   Anxiety    Asthma    Autoimmune disease (HCC)    Cancer (HCC)    Endometrial lymph node removal (30)   Corneal abrasion, right 05/24/2022   Depression    GERD (gastroesophageal reflux disease)    H/O blood clots    upper rt groin and behind both knees   History of hiatal hernia    Hypertension    Lymphedema    right leg   Morphea scleroderma    PONV (postoperative nausea and vomiting)    states gets violently ill   Past Surgical History:  Procedure Laterality Date   BASAL CELL CARCINOMA EXCISION Left    CHOLECYSTECTOMY  2008   FLEXIBLE SIGMOIDOSCOPY N/A 05/19/2022   Procedure: FLEXIBLE SIGMOIDOSCOPY;  Surgeon: Toledo, Alphonsus Jeans, MD;  Location: [facility];  Service: Gastroenterology;  Laterality: N/A;   HERNIA REPAIR  2004   ILEOSTOMY CLOSURE N/A 08/24/2022   Procedure: ILEOSTOMY TAKEDOWN, open loop with Apolonio Bay, PA-C to assist;  Surgeon: Emmalene Hare, MD;  Location: [facility];  Service: General;  Laterality: N/A;   IVC FILTER INSERTION N/A 11/09/2023   Procedure: IVC FILTER INSERTION;  Surgeon: Celso College, MD;  Location: [facility];  Service: Cardiovascular;  Laterality: N/A;   IVC FILTER REMOVAL N/A 01/02/2024   Procedure: IVC FILTER REMOVAL;  Surgeon: Celso College, MD;  Location: [facility];  Service: Cardiovascular;  Laterality: N/A;   PLANTAR FASCIECTOMY     ROBOTIC  ASSISTED TOTAL HYSTERECTOMY WITH BILATERAL SALPINGO OOPHERECTOMY  02/14/2012   ROTATOR CUFF REPAIR Left 03/24/2022   TONSILLECTOMY     TOTAL HIP ARTHROPLASTY Right 11/14/2023   Procedure: TOTAL HIP ARTHROPLASTY - posterior;  Surgeon: Venus Ginsberg, MD;  Location: [facility];  Service: Orthopedics;  Laterality: Right;   XI ROBOTIC ASSISTED LOWER ANTERIOR RESECTION N/A 05/24/2022   Procedure: XI ROBOTIC ASSISTED LOWER ANTERIOR RESECTION;  Surgeon: Emmalene Hare, MD;  Location: [facility];  Service: General;  Laterality: N/A;   Patient Active Problem List   Diagnosis Date Noted   Constipation 01/10/2024   Nausea & vomiting 01/10/2024   S/P total right hip arthroplasty 11/14/2023   Blood coagulation disorder (HCC) 11/10/2023   PTSD (post-traumatic stress disorder) 11/10/2023   Severe episode of recurrent major depressive disorder, without psychotic features (HCC) 11/10/2023   GAD (generalized anxiety disorder) 11/10/2023   High risk medication use 11/10/2023   Anxiety and depression 03/22/2023   Cervical radiculopathy 03/16/2023   Colon cancer screening 03/15/2023   Chronic idiopathic constipation 03/15/2023   Dyspnea 03/07/2023   [place] spotted fever 03/07/2023   Strain of gastrocnemius tendon 03/07/2023   Lower abdominal pain 03/07/2023   Pain in pelvis 03/07/2023   Small bowel obstruction (HCC) 02/11/2023   Raynaud disease 02/11/2023   Polyarthralgia 02/11/2023   Edema 01/03/2023  Diverticular disease 12/10/2022   Hot flashes 12/10/2022   Pain of right lower extremity 12/10/2022   Paresthesia of lower extremity 12/10/2022   Primary insomnia 12/10/2022   Thyroid  nodule 12/10/2022   Multiple joint pain 12/10/2022   Basal cell carcinoma of skin 12/10/2022   Acute bilateral low back pain without sciatica 11/10/2022   Chronic lower back pain 09/05/2022   S/P closure of ileostomy 08/24/2022   Ileostomy in place [facility])    Morphea 07/13/2022   Colonic stricture (HCC)  05/19/2022   Large bowel obstruction (HCC) 05/19/2022   Hypokalemia 05/18/2022   Abdominal pain 05/18/2022   Osteoarthritis of left knee 02/24/2022   Mixed connective tissue disease (HCC) 02/17/2022   Adhesive capsulitis of left shoulder 01/27/2022   Glenohumeral arthritis, left 01/27/2022   Microscopic hematuria 01/12/2022   Otorrhagia of right ear 12/10/2021   Elevated testosterone level in female 11/04/2021   Anti-TPO antibodies present 10/20/2021   Rash 10/13/2021   Hashimoto thyroiditis, 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   Hashimoto's disease 03/23/2021   Exposure to severe acute respiratory syndrome coronavirus 2 (SARS-CoV-2) 12/19/2020   Migraine without aura and without status migrainosus, not intractable 08/19/2020   Myofascial pain 07/30/2020   Neck pain 07/30/2020   Upper back pain 07/30/2020   Cervical facet joint syndrome 07/30/2020   Hair loss 07/14/2020   Edema of lower extremity 07/11/2020   Adenomatous colon polyp 02/26/2020   Gastroesophageal reflux disease 02/15/2020   Diarrhea 02/15/2020   Sleep disorder 01/16/2020   Hyperlipidemia 07/11/2019   Right shoulder pain 04/04/2019   Pre-operative clearance 12/13/2018   Endometrial cancer (HCC) 04/05/2018   Patellofemoral pain syndrome of right knee 03/20/2018   Gastroenteritis 12/07/2017   Fatigue 11/16/2017   Morbid obesity (HCC) 08/11/2017   Stucco keratoses 08/11/2017   Essential hypertension 08/09/2017   History of endometrial cancer 08/09/2017   Lymphedema 08/09/2017   Menopause 08/09/2017   Malignant neoplastic disease (HCC) 04/07/2017   Cellulitis 09/18/2016   Benign hypertension 06/02/2016   Asthma 06/04/1969    PCP: Eather Golder, MD  REFERRING PROVIDER: Eather Golder, MD  REFERRING DIAG: I89.0   THERAPY DIAG:  Lymphedema, not elsewhere classified  Rationale for Evaluation and Treatment: Rehabilitation  ONSET DATE: 2013  (Cancer-related, endometrial 2013)  SUBJECTIVE:                                                                                                                                                                                            SUBJECTIVE STATEMENT:Patient returns to OT for lymphedema care after PT session. Pt states she is sore and winces when changing position during session. Pt does  not rate pain numerically.   PERTINENT HISTORY: Asthma, Autoimmune Disease (Connective Tissue Disease), Endometrial Ca w/ LND ( 30 bilateral pelvic and periaortic LN), adjuvant chemotherapy and XRT,   H/O blood clots, RLE/RLQ lymphedema, Robotic assisted total hysterectomy w/ bilateral oophorectomy L Rotator Cuff repair, S/P ileostomy 2/2 colon stricture 05/2022; s/p R hip arthroplasty 11/11/23.   PAIN:  Are you having pain? YES. See subjective; not rated /10 Pain location: RLE, R hip, cervical spine Pain description: sore, aching, heavy, full, tight,  Aggravating factors: standing, walking, extended dependent sitting Relieving factors: elevation, movement  PRECAUTIONS: Fall and Other: LYMPHEDEMA  WEIGHT BEARING RESTRICTIONS: Yes 5# lifting restriction- shoulder  FALLS:  Has patient fallen in last 6 months? No  LIVING ENVIRONMENT: Lives with: lives with their spouse Lives in: House/apartment Stairs: No;  Has following equipment at home: Single point cane, Environmental consultant - 2 wheeled, Environmental consultant - 4 wheeled, and Wheelchair (manual)  OCCUPATION: retired Control and instrumentation engineer: gardening, hiking, biking, dancing- unable to participate in any of these due to pain and swelling  HAND DOMINANCE: right   PRIOR LEVEL OF FUNCTION: Independent with basic ADLs, Independent with household mobility without device, Independent with community mobility without device, Requires assistive device for independence, Needs assistance with homemaking, Needs assistance with transfers, and Leisure: decreased  social participation for leisure pursuits due to impaired mobility and pain  PATIENT GOALS:  Be able  to move freely without pain Reduce limb volume to be able to lift limb for basic daily ADLs and functional ambulation Reduce limb volume to increase ability to perform lower body dressing and bathing and grooming Reduce limb to increase body image  OBJECTIVE: Note: Objective measures were completed at Evaluation unless otherwise noted.  COGNITION:  Overall cognitive status: Within functional limits for tasks assessed   OBSERVATIONS / OTHER ASSESSMENTS:   POSTURE: WFL  LE ROM: WFL, but Limited mildly at R knee and ankle due to girth, skin approximation, and joint pain  LE MMT: WFL  Mild, Stage  II, Bilateral Lower Extremity Lymphedema 2/2 CVI and Obesity  Skin  Description Hyper-Keratosis Peau d' Orange Shiny Tight Fibrotic/ Indurated Fatty Doughy Spongy/ boggy   x x x x R>L Severe morphea at  R groin   x   Skin dry Flaky WNL Macerated   mildly sclerotic     Color Redness Varicosities Blanching Hemosiderin Stain Mottled   x x x   x   Odor Malodorous Yeast Fungal infection  WNL      x   Temperature Warm Cool wnl    x     Pitting Edema   1+ 2+ 3+ 4+ Non-pitting         x   Girth Symmetrical Asymmetrical                   Distribution    R>L RLE toes to groin    Stemmer Sign Positive Negative   +    Lymphorrhea History Of:  Present Absent     x    Wounds History Of Present Absent Venous Arterial Pressure Sheer     x        Signs of Infection Redness Warmth Erythema Acute Swelling Drainage Borders                    Sensation Light Touch Deep pressure Hypersensitivity   In tact Impaired In tact Impaired Absent Impaired   x  x  x     Nails  WNL   Fungus nail dystrophy   x     Hair Growth Symmetrical Asymmetrical    R>L   Skin Creases Base of toes  Ankles   Base of Fingers knees       Abdominal pannus Thigh Lobules  Face/neck   x x  x        BLE COMPARATIVE LIMB VOLUMETRICS 10/10/23  LANDMARK RIGHT  10/10/23  R LEG (A-D) 5466.5 ml  R  THIGH (E-G) 9186.6 ml  R FULL LIMB (A-G) 14653.1 ml  Limb Volume differential (LVD)  LVD for LEG = 44.1%, R>L LVD for THIGH= 33.98%, R>L LVD for FULL lower extremity 37.8%, R>L  Volume change since last 08/11/22 R LEG is INCREASED in volume by 59.6%. L THIGH is INCREASED by 38 %, and RLE full limb is INCREASED by 45.38%.  Volume change overall V  (Blank rows = not tested)  LANDMARK LEFT  10/12/23  L LEG (A-D) 3053.6 ml  L THIGH (E-G) 6064.8 ml  L  FULL LIMB (A-G) 9118.4 ml  Limb Volume differential (LVD)  %  Volume change since initial %  Volume change overall %  (Blank rows = not tested)   10 th VISIT PROGRESS NOTE RLE COMPARATIVE LIMB VOLUMETRICS 01/04/24: Deferred until next visit by Pt request.  LANDMARK RIGHT    R LEG (A-D) TBA  R THIGH (E-G) TBA  R FULL LIMB (A-G) TBA  Limb Volume differential (LVD)    Volume change since last 08/11/22 TBA  Volume change overall TBA  (Blank rows = not tested)  20 th VISIT PROGRESS NOTE RLE COMPARATIVE LIMB VOLUMETRICS TBA next session  LANDMARK RIGHT    R LEG (A-D) 6005.7 ml  R THIGH (E-G) 9400.3 ml  R FULL LIMB (A-G) 15406.02 ml  Limb Volume differential (LVD)    Volume change since last measured on 10/10/23 R LEG is INCREASED in volume by 9.9%. L THIGH is INCREASED by 2.32 %, and RLE full limb is INCREASED by 5.13% since last measured on 10/10/23.  Volume change since commencing OT for CDT %   ENDOMETRIAL CA-related LYMPHEDEMA Hx:  SURGERY TYPE/DATE: 2013 NUMBER OF LYMPH NODES REMOVED: 30 by report- bilateral pelvic and periaortic CHEMOTHERAPY: yes RADIATION:yes INFECTIONS: no hx cellulitis GAIT: Distance walked: >500 ft Assistive device utilized: single point cane Level of assistance: Modified independence- extra time Comments: R limp  LYMPHEDEMA LIFE IMPACT SCALE (LLIS): Intake 10/12/23 75% (The extent to which lymphedema related problems impacted your life over the past week)  FOTO (functional outcome measure):10/10/23 INTAKE:  36% No Out take score. FOTO assessment discontinued by clinic.  PATIENT EDUCATION:  Education details: Continued Pt/ CG edu for lymphedema self care home program throughout session. Topics include outcome of comparative limb volumetrics- starting limb volume differentials (LVDs), technology and gradient techniques used for short stretch, multilayer compression wrapping, simple self-MLD, therapeutic lymphatic pumping exercises, skin/nail care, LE precautions,. compression garment recommendations and specifications, wear and care schedule and compression garment donning / doffing w assistive devices. Discussed progress towards all OT goals since commencing CDT. All questions answered to the Pt's satisfaction. Good return. Person educated: Patient Education method: Explanation, Demonstration, and Handouts Education comprehension: verbalized understanding, returned demonstration, and needs further education  LE SELF-CARE HOME  PROGRAM: Simple self-MLD/daily to affected quadrant and body part At least 2 x daily BLE Lymphatic Pumping There ex 1 set of 10 reps each, in order, bilaterally Daily skin care to affected body part to limit infection risk and increase  skin excursion Compression Bandaging Intensive stage compression: multilayer short stretch wraps with gradient techniques. One limb at a time. Length patient dependent. Self-management Phase: Appropriate daytime compression garment and hours-of-sleep device  Compression garments: Custom-made gradient compression garments and hours-of-sleep devices are medically necessary because they are uniquely sized and shaped to fit the exact dimensions of the affected extremities and to provide accurate and consistent gradient compression and containment, essential  for optimally managing chronic, progressive lymphedema. The convoluted HOS devices are medically necessary to facilitate increased lymphatic circulation and limit fibrosis formation when sleeping.  Multiple custom compression garments are needed for optimal hygiene to limit infection risk. Custom compression garments should be replaced q 3-6 months When worn consistently for optimal lipo-lymphedema self-management over time.  Pt will benefit from [id number]- Circaid reduction kit whole leg to increase independence with lymphedema self care, to  reduce limb volume  for body symmetry and balance, and to limit infection risk and progression.  ASSESSMENT:  CLINICAL IMPRESSION:  Progress is slow. Lymphedema management  with combination of progressive, secondary lymphedema with connective tissue disease is very stubborn and challenging.  R thigh has softened  marginally, but leg and ankle remain very hard and tight with dense    congestion. Pt tolerated MLD to RLE/RLQ without increased pain. We also used deep fibrosis techniques to distal leg and medial malleolus with palpable softening in tissue density after session.Applied multilayer compression wraps as established using gradient techniques.  Cont as per POC.  10/10/23 Lymphedema Episode 2, Initial OT Evaluation : Elizabeth Martin is a 61 y a female presenting with chronic, progressive, moderate, Stage II, RLE/RLQ cancer -related lymphedema with onset 11 years ago after Rx for endometrial cancer. Pt is well known to this therapist as she has successfully undergone Complete Decongestive Therapy (CDT)  this clinic bin the past. Pt reports recent exacerbation of RLE swelling worsened as inflammation in L hip has worsened over time.  Pt has a hip replacement scheduled in late December here it [facility]. RE limb volumetrics today reveal that R LEG volume is dramatically increased by 59.65 since last visit. R LEG VOLUME is INCREASED in volume by 59.6%. L THIGH is INCREASED by 38 %, and RLE full limb is INCREASED by 45.38%. Pt presents with LE-related skin changes. Prolonged inflammation in the R hip joint has likely overloaded  already RLE/RLQ lymphatics.  RLE/RLQ  lymphedema limits Pt's ability to perform basic and instrumental ADLs, including functional ambulation, mobility and transfers, grooming , lower body bathing and dressing, skin inspection and skin care. Pt has difficulty  reaching her lower legs and feet to apply compression wraps and don/doff        compression stockings. LE limits ability to P[perform instrumental ADLs, including driving, yard work and home management activities. Lymphedema limits her ability to participate in leisure pursuits and productive activities, and it negatively impacts body image, life roles and quality of life.   Pt will benefit from an Intensive and follow along course of lymphedema. CDT will consist of Manual Lymphatic gainage   (MLD), skin care, therapeutic exercise and compression, wraps initially then garments. Pt will need assistance throughout CDT   for applying compression wraps due to limited hip AROM and decreased skin flexibility. Without skilled Occupational Therapy for lymphedema care, lymphedema will progress and further functional decline is expected.   In prep for upcoming hip replacement OT will educate Pt re assistive devices, including tub transfer bench and elevated toilet seat, in keeping with hip precautions.  OBJECTIVE IMPAIRMENTS: Abnormal gait, decreased activity tolerance, decreased balance, decreased knowledge of use of DME, decreased mobility, difficulty walking, decreased ROM, decreased strength, increased edema, decreased skin flexibility, increased fascial restrictions, impaired sensation, pain, and chronic, progressive LLE/LLQ lymphatic swelling and associated pain.   ADL LIMITATIONS: carrying, lifting, bending, sitting, standing, squatting, sleeping, stairs, transfers, bed mobility, bathing, dressing, hygiene/grooming, and productive activities, leisure pursuits, social participation, body image  driving, shopping, housework, yard work, cooking, meal prep  PERSONAL FACTORS: Past/current  experiences and 3+ comorbidities: Morphea Scleroderma, Mixed connective tissue disease, and osteoarthritis  are also affecting patient's functional outcome.   REHAB POTENTIAL: Good  EVALUATION COMPLEXITY: Moderate  GOALS: Goals reviewed with patient? Yes  SHORT TERM GOALS: Target date: 4th OT Rx visit   Pt will demonstrate understanding of lymphedema precautions and prevention strategies with modified independence using a printed reference to identify at least 5 precautions and discussing how s/he may implement them into daily life to reduce risk of progression with extra time.  Baseline:Max A Goal status: GOAL MET  2.  Pt will be able to apply multilayer, knee length, gradient, compression wraps to one leg at a time with modified assistance (extra time and assistive device/s) to decrease limb volume, to limit infection risk, and to limit lymphedema progression.  Baseline: Dependent Goal status: GOAL MET  LONG TERM GOALS: Target date: 01/09/24 (12 weeks)  Given this patient's Intake score 36% on the functional outcomes FOTO tool, patient will experience an increase in function of 3 points to improve basic and instrumental ADLs performance, including lymphedema self-care.  Baseline: Max A Goal status: DEFERRED as Elizabeth Martin has been discontinued  2.  Given this patient's Intake score of  75% on the Lymphedema Life Impact Scale (LLIS), patient will experience a reduction of at least 5 points in her perceived level of functional impairment resulting from lymphedema to improve functional performance and quality of life (QOL). Baseline: % Goal status: PROGRESSING  3.  Pt will achieve at least a 10% volume reduction in full, RLE limb volume to return limb to typical size and shape, to limit infection risk and LE progression, to decrease pain, to improve function. Baseline: Dependent Goal status:PROGRESSING.  Volumetrics deferred until next week to focus on manual therapy and relief of  pain/discomfort. To date by visual assessment I would estimate no more than a 10% volume reduction. DENSITY IN THIGH HAS REDUCED only SLIGHTLY. Morphea at R groin continues to present a significant obstacle to limb volume decongestion and fibrosis reduction.  Progress is slow. Lymphedema management  with combination of progressive, secondary lymphedema with connective tissue disease is very stubborn and challenging.   4.  Pt will obtain proper compression garments/devices and achieve modified independence (extra time + assistive devices) with donning/doffing to optimize limb volume reductions and limit LE  progression over time. Baseline:  Goal status: 01/04/24: PARTIALLY MET; In an effort to reduce burden of care on her spouse, Pt obtained adjustable, Velcro style R full leg, Circaid leg reduction kit for use s/p THA, but this proved not effective for controlling swelling, and were not easier to don and doff more independently. We've resumed wrapping until she is able to fit back into custom compression garments. 02/14/24: Ongoing  5.  During Intensive phase CDT , with modified independence, Pt will achieve at least 85% compliance with all lymphedema self-care home program components, including daily skin care, compression wraps and /or garments, simple self MLD and lymphatic pumping therex to habituate LE self care  protocol  into ADLs for optimal LE self-management over time. Baseline: Dependent Goal status: 02/14/24: GOAL MET and EXCEEDED PLAN:  OT FREQUENCY: 2x/week  OT DURATION: 12 weeks and PRN  PLANNED INTERVENTIONS: Complete Decongestive Therapy (Intensive and supported Self-Management Phases), 97110-Therapeutic exercises, 97530- Therapeutic activity, 97535- Self Care, 86578- Manual therapy, Patient/Family education, Taping, Manual lymph drainage, Scar mobilization, Compression bandaging, DME instructions, and skin care to reduce infection risk throughout manual therapy. Fit with replacement  compression garments ASAP  PLAN FOR NEXT SESSION:  Complete RLE comparative limb volumetrics for progress note update Cont RLE/RLQ MLD Cont multilayer compression wrapping    Arnold Bicker, MS, OTR/L, CLT-LANA 03/23/24 8:11 AM  03/23/2024, 8:11 AM

## 2024-03-26 ENCOUNTER — Ambulatory Visit: Admitting: Occupational Therapy

## 2024-03-26 ENCOUNTER — Ambulatory Visit

## 2024-03-26 DIAGNOSIS — M6281 Muscle weakness (generalized): Secondary | ICD-10-CM

## 2024-03-26 DIAGNOSIS — R2681 Unsteadiness on feet: Secondary | ICD-10-CM

## 2024-03-26 DIAGNOSIS — I890 Lymphedema, not elsewhere classified: Secondary | ICD-10-CM

## 2024-03-26 DIAGNOSIS — R262 Difficulty in walking, not elsewhere classified: Secondary | ICD-10-CM

## 2024-03-26 DIAGNOSIS — M25651 Stiffness of right hip, not elsewhere classified: Secondary | ICD-10-CM

## 2024-03-26 DIAGNOSIS — M25551 Pain in right hip: Secondary | ICD-10-CM

## 2024-03-26 NOTE — Therapy (Signed)
 OUTPATIENT OCCUPATIONAL THERAPY TREATMENT NOTE  LOWER EXTREMITY LYMPHEDEMA  Patient Name: Elizabeth Martin MRN: [id number] DOB:1961-09-09, 63 y.o., female Today's Date: 03/26/2024  REPORTING PERIOD:   END OF SESSION:  Lymphedema Episode 2   OT End of Session - 03/26/24 1131     Visit Number 25    Number of Visits 36    Date for OT Re-Evaluation 04/16/24    OT Start Time 1125    Activity Tolerance Patient tolerated treatment well;No increased pain    Behavior During Therapy WFL for tasks assessed/performed              Past Medical History:  Diagnosis Date   Anxiety    Asthma    Autoimmune disease (HCC)    Cancer (HCC)    Endometrial lymph node removal (30)   Corneal abrasion, right 05/24/2022   Depression    GERD (gastroesophageal reflux disease)    H/O blood clots    upper rt groin and behind both knees   History of hiatal hernia    Hypertension    Lymphedema    right leg   Morphea scleroderma    PONV (postoperative nausea and vomiting)    states gets violently ill   Past Surgical History:  Procedure Laterality Date   BASAL CELL CARCINOMA EXCISION Left    CHOLECYSTECTOMY  2008   FLEXIBLE SIGMOIDOSCOPY N/A 05/19/2022   Procedure: FLEXIBLE SIGMOIDOSCOPY;  Surgeon: Toledo, Alphonsus Jeans, MD;  Location: [facility];  Service: Gastroenterology;  Laterality: N/A;   HERNIA REPAIR  2004   ILEOSTOMY CLOSURE N/A 08/24/2022   Procedure: ILEOSTOMY TAKEDOWN, open loop with Apolonio Bay, PA-C to assist;  Surgeon: Emmalene Hare, MD;  Location: [facility];  Service: General;  Laterality: N/A;   IVC FILTER INSERTION N/A 11/09/2023   Procedure: IVC FILTER INSERTION;  Surgeon: Celso College, MD;  Location: [facility];  Service: Cardiovascular;  Laterality: N/A;   IVC FILTER REMOVAL N/A 01/02/2024   Procedure: IVC FILTER REMOVAL;  Surgeon: Celso College, MD;  Location: [facility];  Service: Cardiovascular;  Laterality: N/A;   PLANTAR FASCIECTOMY     ROBOTIC  ASSISTED TOTAL HYSTERECTOMY WITH BILATERAL SALPINGO OOPHERECTOMY  02/14/2012   ROTATOR CUFF REPAIR Left 03/24/2022   TONSILLECTOMY     TOTAL HIP ARTHROPLASTY Right 11/14/2023   Procedure: TOTAL HIP ARTHROPLASTY - posterior;  Surgeon: Venus Ginsberg, MD;  Location: [facility];  Service: Orthopedics;  Laterality: Right;   XI ROBOTIC ASSISTED LOWER ANTERIOR RESECTION N/A 05/24/2022   Procedure: XI ROBOTIC ASSISTED LOWER ANTERIOR RESECTION;  Surgeon: Emmalene Hare, MD;  Location: [facility];  Service: General;  Laterality: N/A;   Patient Active Problem List   Diagnosis Date Noted   Constipation 01/10/2024   Nausea & vomiting 01/10/2024   S/P total right hip arthroplasty 11/14/2023   Blood coagulation disorder (HCC) 11/10/2023   PTSD (post-traumatic stress disorder) 11/10/2023   Severe episode of recurrent major depressive disorder, without psychotic features (HCC) 11/10/2023   GAD (generalized anxiety disorder) 11/10/2023   High risk medication use 11/10/2023   Anxiety and depression 03/22/2023   Cervical radiculopathy 03/16/2023   Colon cancer screening 03/15/2023   Chronic idiopathic constipation 03/15/2023   Dyspnea 03/07/2023   [place] spotted fever 03/07/2023   Strain of gastrocnemius tendon 03/07/2023   Lower abdominal pain 03/07/2023   Pain in pelvis 03/07/2023   Small bowel obstruction (HCC) 02/11/2023   Raynaud disease 02/11/2023   Polyarthralgia 02/11/2023   Edema 01/03/2023  Diverticular disease 12/10/2022   Hot flashes 12/10/2022   Pain of right lower extremity 12/10/2022   Paresthesia of lower extremity 12/10/2022   Primary insomnia 12/10/2022   Thyroid  nodule 12/10/2022   Multiple joint pain 12/10/2022   Basal cell carcinoma of skin 12/10/2022   Acute bilateral low back pain without sciatica 11/10/2022   Chronic lower back pain 09/05/2022   S/P closure of ileostomy 08/24/2022   Ileostomy in place [facility])    Morphea 07/13/2022   Colonic stricture (HCC)  05/19/2022   Large bowel obstruction (HCC) 05/19/2022   Hypokalemia 05/18/2022   Abdominal pain 05/18/2022   Osteoarthritis of left knee 02/24/2022   Mixed connective tissue disease (HCC) 02/17/2022   Adhesive capsulitis of left shoulder 01/27/2022   Glenohumeral arthritis, left 01/27/2022   Microscopic hematuria 01/12/2022   Otorrhagia of right ear 12/10/2021   Elevated testosterone level in female 11/04/2021   Anti-TPO antibodies present 10/20/2021   Rash 10/13/2021   Hashimoto thyroiditis, 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   Hashimoto's disease 03/23/2021   Exposure to severe acute respiratory syndrome coronavirus 2 (SARS-CoV-2) 12/19/2020   Migraine without aura and without status migrainosus, not intractable 08/19/2020   Myofascial pain 07/30/2020   Neck pain 07/30/2020   Upper back pain 07/30/2020   Cervical facet joint syndrome 07/30/2020   Hair loss 07/14/2020   Edema of lower extremity 07/11/2020   Adenomatous colon polyp 02/26/2020   Gastroesophageal reflux disease 02/15/2020   Diarrhea 02/15/2020   Sleep disorder 01/16/2020   Hyperlipidemia 07/11/2019   Right shoulder pain 04/04/2019   Pre-operative clearance 12/13/2018   Endometrial cancer (HCC) 04/05/2018   Patellofemoral pain syndrome of right knee 03/20/2018   Gastroenteritis 12/07/2017   Fatigue 11/16/2017   Morbid obesity (HCC) 08/11/2017   Stucco keratoses 08/11/2017   Essential hypertension 08/09/2017   History of endometrial cancer 08/09/2017   Lymphedema 08/09/2017   Menopause 08/09/2017   Malignant neoplastic disease (HCC) 04/07/2017   Cellulitis 09/18/2016   Benign hypertension 06/02/2016   Asthma 06/04/1969    PCP: Eather Golder, MD  REFERRING PROVIDER: Eather Golder, MD  REFERRING DIAG: I89.0   THERAPY DIAG:  Lymphedema, not elsewhere classified  Rationale for Evaluation and Treatment: Rehabilitation  ONSET DATE: 2013  (Cancer-related, endometrial 2013)  SUBJECTIVE:                                                                                                                                                                                            SUBJECTIVE STATEMENT:Patient returns to OT for lymphedema care after PT session. Pt states she is sore and winces when changing position during session. Pt does  not rate pain numerically.   PERTINENT HISTORY: Asthma, Autoimmune Disease (Connective Tissue Disease), Endometrial Ca w/ LND ( 30 bilateral pelvic and periaortic LN), adjuvant chemotherapy and XRT,   H/O blood clots, RLE/RLQ lymphedema, Robotic assisted total hysterectomy w/ bilateral oophorectomy L Rotator Cuff repair, S/P ileostomy 2/2 colon stricture 05/2022; s/p R hip arthroplasty 11/11/23.   PAIN:  Are you having pain? YES. See subjective; not rated /10 Pain location: RLE, R hip, cervical spine Pain description: sore, aching, heavy, full, tight,  Aggravating factors: standing, walking, extended dependent sitting Relieving factors: elevation, movement  PRECAUTIONS: Fall and Other: LYMPHEDEMA  WEIGHT BEARING RESTRICTIONS: Yes 5# lifting restriction- shoulder  FALLS:  Has patient fallen in last 6 months? No  LIVING ENVIRONMENT: Lives with: lives with their spouse Lives in: House/apartment Stairs: No;  Has following equipment at home: Single point cane, Environmental consultant - 2 wheeled, Environmental consultant - 4 wheeled, and Wheelchair (manual)  OCCUPATION: retired Control and instrumentation engineer: gardening, hiking, biking, dancing- unable to participate in any of these due to pain and swelling  HAND DOMINANCE: right   PRIOR LEVEL OF FUNCTION: Independent with basic ADLs, Independent with household mobility without device, Independent with community mobility without device, Requires assistive device for independence, Needs assistance with homemaking, Needs assistance with transfers, and Leisure: decreased  social participation for leisure pursuits due to impaired mobility and pain  PATIENT GOALS:  Be able  to move freely without pain Reduce limb volume to be able to lift limb for basic daily ADLs and functional ambulation Reduce limb volume to increase ability to perform lower body dressing and bathing and grooming Reduce limb to increase body image  OBJECTIVE: Note: Objective measures were completed at Evaluation unless otherwise noted.  COGNITION:  Overall cognitive status: Within functional limits for tasks assessed   OBSERVATIONS / OTHER ASSESSMENTS:   POSTURE: WFL  LE ROM: WFL, but Limited mildly at R knee and ankle due to girth, skin approximation, and joint pain  LE MMT: WFL  Mild, Stage  II, Bilateral Lower Extremity Lymphedema 2/2 CVI and Obesity  Skin  Description Hyper-Keratosis Peau d' Orange Shiny Tight Fibrotic/ Indurated Fatty Doughy Spongy/ boggy   x x x x R>L Severe morphea at  R groin   x   Skin dry Flaky WNL Macerated   mildly sclerotic     Color Redness Varicosities Blanching Hemosiderin Stain Mottled   x x x   x   Odor Malodorous Yeast Fungal infection  WNL      x   Temperature Warm Cool wnl    x     Pitting Edema   1+ 2+ 3+ 4+ Non-pitting         x   Girth Symmetrical Asymmetrical                   Distribution    R>L RLE toes to groin    Stemmer Sign Positive Negative   +    Lymphorrhea History Of:  Present Absent     x    Wounds History Of Present Absent Venous Arterial Pressure Sheer     x        Signs of Infection Redness Warmth Erythema Acute Swelling Drainage Borders                    Sensation Light Touch Deep pressure Hypersensitivity   In tact Impaired In tact Impaired Absent Impaired   x  x  x     Nails  WNL   Fungus nail dystrophy   x     Hair Growth Symmetrical Asymmetrical    R>L   Skin Creases Base of toes  Ankles   Base of Fingers knees       Abdominal pannus Thigh Lobules  Face/neck   x x  x        BLE COMPARATIVE LIMB VOLUMETRICS 10/10/23  LANDMARK RIGHT  10/10/23  R LEG (A-D) 5466.5 ml  R  THIGH (E-G) 9186.6 ml  R FULL LIMB (A-G) 14653.1 ml  Limb Volume differential (LVD)  LVD for LEG = 44.1%, R>L LVD for THIGH= 33.98%, R>L LVD for FULL lower extremity 37.8%, R>L  Volume change since last 08/11/22 R LEG is INCREASED in volume by 59.6%. L THIGH is INCREASED by 38 %, and RLE full limb is INCREASED by 45.38%.  Volume change overall V  (Blank rows = not tested)  LANDMARK LEFT  10/12/23  L LEG (A-D) 3053.6 ml  L THIGH (E-G) 6064.8 ml  L  FULL LIMB (A-G) 9118.4 ml  Limb Volume differential (LVD)  %  Volume change since initial %  Volume change overall %  (Blank rows = not tested)   10 th VISIT PROGRESS NOTE RLE COMPARATIVE LIMB VOLUMETRICS 01/04/24: Deferred until next visit by Pt request.  LANDMARK RIGHT    R LEG (A-D) TBA  R THIGH (E-G) TBA  R FULL LIMB (A-G) TBA  Limb Volume differential (LVD)    Volume change since last 08/11/22 TBA  Volume change overall TBA  (Blank rows = not tested)  20 th VISIT PROGRESS NOTE RLE COMPARATIVE LIMB VOLUMETRICS TBA next session  LANDMARK RIGHT    R LEG (A-D) 6005.7 ml  R THIGH (E-G) 9400.3 ml  R FULL LIMB (A-G) 15406.02 ml  Limb Volume differential (LVD)    Volume change since last measured on 10/10/23 R LEG is INCREASED in volume by 9.9%. L THIGH is INCREASED by 2.32 %, and RLE full limb is INCREASED by 5.13% since last measured on 10/10/23.  Volume change since commencing OT for CDT %   ENDOMETRIAL CA-related LYMPHEDEMA Hx:  SURGERY TYPE/DATE: 2013 NUMBER OF LYMPH NODES REMOVED: 30 by report- bilateral pelvic and periaortic CHEMOTHERAPY: yes RADIATION:yes INFECTIONS: no hx cellulitis GAIT: Distance walked: >500 ft Assistive device utilized: single point cane Level of assistance: Modified independence- extra time Comments: R limp  LYMPHEDEMA LIFE IMPACT SCALE (LLIS): Intake 10/12/23 75% (The extent to which lymphedema related problems impacted your life over the past week)  FOTO (functional outcome measure):10/10/23 INTAKE:  36% No Out take score. FOTO assessment discontinued by clinic.  PATIENT EDUCATION:  Education details: Continued Pt/ CG edu for lymphedema self care home program throughout session. Topics include outcome of comparative limb volumetrics- starting limb volume differentials (LVDs), technology and gradient techniques used for short stretch, multilayer compression wrapping, simple self-MLD, therapeutic lymphatic pumping exercises, skin/nail care, LE precautions,. compression garment recommendations and specifications, wear and care schedule and compression garment donning / doffing w assistive devices. Discussed progress towards all OT goals since commencing CDT. All questions answered to the Pt's satisfaction. Good return. Person educated: Patient Education method: Explanation, Demonstration, and Handouts Education comprehension: verbalized understanding, returned demonstration, and needs further education  LE SELF-CARE HOME  PROGRAM: Simple self-MLD/daily to affected quadrant and body part At least 2 x daily BLE Lymphatic Pumping There ex 1 set of 10 reps each, in order, bilaterally Daily skin care to affected body part to limit infection risk and increase  skin excursion Compression Bandaging Intensive stage compression: multilayer short stretch wraps with gradient techniques. One limb at a time. Length patient dependent. Self-management Phase: Appropriate daytime compression garment and hours-of-sleep device  Compression garments: Custom-made gradient compression garments and hours-of-sleep devices are medically necessary because they are uniquely sized and shaped to fit the exact dimensions of the affected extremities and to provide accurate and consistent gradient compression and containment, essential  for optimally managing chronic, progressive lymphedema. The convoluted HOS devices are medically necessary to facilitate increased lymphatic circulation and limit fibrosis formation when sleeping.  Multiple custom compression garments are needed for optimal hygiene to limit infection risk. Custom compression garments should be replaced q 3-6 months When worn consistently for optimal lipo-lymphedema self-management over time.  Pt will benefit from [id number]- Circaid reduction kit whole leg to increase independence with lymphedema self care, to  reduce limb volume  for body symmetry and balance, and to limit infection risk and progression.  ASSESSMENT:  CLINICAL IMPRESSION:  Progress is slow. Lymphedema management  with combination of progressive, secondary lymphedema with connective tissue disease is very stubborn and challenging.  R thigh has softened  marginally, but leg and ankle remain very hard and tight with dense    congestion. Pt tolerated MLD to RLE/RLQ without increased pain. We also used deep fibrosis techniques to distal leg and medial malleolus with palpable softening in tissue density after session.Applied multilayer compression wraps as established using gradient techniques.  Cont as per POC.  10/10/23 Lymphedema Episode 2, Initial OT Evaluation : Afton Lavalle is a 66 y a female presenting with chronic, progressive, moderate, Stage II, RLE/RLQ cancer -related lymphedema with onset 11 years ago after Rx for endometrial cancer. Pt is well known to this therapist as she has successfully undergone Complete Decongestive Therapy (CDT)  this clinic bin the past. Pt reports recent exacerbation of RLE swelling worsened as inflammation in L hip has worsened over time.  Pt has a hip replacement scheduled in late December here it [facility]. RE limb volumetrics today reveal that R LEG volume is dramatically increased by 59.65 since last visit. R LEG VOLUME is INCREASED in volume by 59.6%. L THIGH is INCREASED by 38 %, and RLE full limb is INCREASED by 45.38%. Pt presents with LE-related skin changes. Prolonged inflammation in the R hip joint has likely overloaded  already RLE/RLQ lymphatics.  RLE/RLQ  lymphedema limits Pt's ability to perform basic and instrumental ADLs, including functional ambulation, mobility and transfers, grooming , lower body bathing and dressing, skin inspection and skin care. Pt has difficulty  reaching her lower legs and feet to apply compression wraps and don/doff        compression stockings. LE limits ability to P[perform instrumental ADLs, including driving, yard work and home management activities. Lymphedema limits her ability to participate in leisure pursuits and productive activities, and it negatively impacts body image, life roles and quality of life.   Pt will benefit from an Intensive and follow along course of lymphedema. CDT will consist of Manual Lymphatic gainage   (MLD), skin care, therapeutic exercise and compression, wraps initially then garments. Pt will need assistance throughout CDT   for applying compression wraps due to limited hip AROM and decreased skin flexibility. Without skilled Occupational Therapy for lymphedema care, lymphedema will progress and further functional decline is expected.   In prep for upcoming hip replacement OT will educate Pt re assistive devices, including tub transfer bench and elevated toilet seat, in keeping with hip precautions.  OBJECTIVE IMPAIRMENTS: Abnormal gait, decreased activity tolerance, decreased balance, decreased knowledge of use of DME, decreased mobility, difficulty walking, decreased ROM, decreased strength, increased edema, decreased skin flexibility, increased fascial restrictions, impaired sensation, pain, and chronic, progressive LLE/LLQ lymphatic swelling and associated pain.   ADL LIMITATIONS: carrying, lifting, bending, sitting, standing, squatting, sleeping, stairs, transfers, bed mobility, bathing, dressing, hygiene/grooming, and productive activities, leisure pursuits, social participation, body image  driving, shopping, housework, yard work, cooking, meal prep  PERSONAL FACTORS: Past/current  experiences and 3+ comorbidities: Morphea Scleroderma, Mixed connective tissue disease, and osteoarthritis  are also affecting patient's functional outcome.   REHAB POTENTIAL: Good  EVALUATION COMPLEXITY: Moderate  GOALS: Goals reviewed with patient? Yes  SHORT TERM GOALS: Target date: 4th OT Rx visit   Pt will demonstrate understanding of lymphedema precautions and prevention strategies with modified independence using a printed reference to identify at least 5 precautions and discussing how s/he may implement them into daily life to reduce risk of progression with extra time.  Baseline:Max A Goal status: GOAL MET  2.  Pt will be able to apply multilayer, knee length, gradient, compression wraps to one leg at a time with modified assistance (extra time and assistive device/s) to decrease limb volume, to limit infection risk, and to limit lymphedema progression.  Baseline: Dependent Goal status: GOAL MET  LONG TERM GOALS: Target date: 01/09/24 (12 weeks)  Given this patient's Intake score 36% on the functional outcomes FOTO tool, patient will experience an increase in function of 3 points to improve basic and instrumental ADLs performance, including lymphedema self-care.  Baseline: Max A Goal status: DEFERRED as [employer] has been discontinued  2.  Given this patient's Intake score of  75% on the Lymphedema Life Impact Scale (LLIS), patient will experience a reduction of at least 5 points in her perceived level of functional impairment resulting from lymphedema to improve functional performance and quality of life (QOL). Baseline: % Goal status: PROGRESSING  3.  Pt will achieve at least a 10% volume reduction in full, RLE limb volume to return limb to typical size and shape, to limit infection risk and LE progression, to decrease pain, to improve function. Baseline: Dependent Goal status:PROGRESSING.  Volumetrics deferred until next week to focus on manual therapy and relief of  pain/discomfort. To date by visual assessment I would estimate no more than a 10% volume reduction. DENSITY IN THIGH HAS REDUCED only SLIGHTLY. Morphea at R groin continues to present a significant obstacle to limb volume decongestion and fibrosis reduction.  Progress is slow. Lymphedema management  with combination of progressive, secondary lymphedema with connective tissue disease is very stubborn and challenging.   4.  Pt will obtain proper compression garments/devices and achieve modified independence (extra time + assistive devices) with donning/doffing to optimize limb volume reductions and limit LE  progression over time. Baseline:  Goal status: 01/04/24: PARTIALLY MET; In an effort to reduce burden of care on her spouse, Pt obtained adjustable, Velcro style R full leg, Circaid leg reduction kit for use s/p THA, but this proved not effective for controlling swelling, and were not easier to don and doff more independently. We've resumed wrapping until she is able to fit back into custom compression garments. 02/14/24: Ongoing  5.  During Intensive phase CDT , with modified independence, Pt will achieve at least 85% compliance with all lymphedema self-care home program components, including daily skin care, compression wraps and /or garments, simple self MLD and lymphatic pumping therex to habituate LE self care  protocol  into ADLs for optimal LE self-management over time. Baseline: Dependent Goal status: 02/14/24: GOAL MET and EXCEEDED PLAN:  OT FREQUENCY: 2x/week  OT DURATION: 12 weeks and PRN  PLANNED INTERVENTIONS: Complete Decongestive Therapy (Intensive and supported Self-Management Phases), 97110-Therapeutic exercises, 97530- Therapeutic activity, 97535- Self Care, 30865- Manual therapy, Patient/Family education, Taping, Manual lymph drainage, Scar mobilization, Compression bandaging, DME instructions, and skin care to reduce infection risk throughout manual therapy. Fit with replacement  compression garments ASAP  PLAN FOR NEXT SESSION:  Complete RLE comparative limb volumetrics for progress note update Cont RLE/RLQ MLD Cont multilayer compression wrapping    Arnold Bicker, MS, OTR/L, CLT-LANA 03/26/24 11:32 AM  03/26/2024, 11:32 AM  OUTPATIENT OCCUPATIONAL THERAPY TREATMENT NOTE  LOWER EXTREMITY LYMPHEDEMA  Patient Name: Elizabeth Martin MRN: [id number] DOB:1961-03-24, 63 y.o., female Today's Date: 03/26/2024  REPORTING PERIOD:   END OF SESSION:  Lymphedema Episode 2   OT End of Session - 03/26/24 1131     Visit Number 25    Number of Visits 36    Date for OT Re-Evaluation 04/16/24    OT Start Time 1125    Activity Tolerance Patient tolerated treatment well;No increased pain    Behavior During Therapy WFL for tasks assessed/performed              Past Medical History:  Diagnosis Date   Anxiety    Asthma    Autoimmune disease (HCC)    Cancer (HCC)    Endometrial lymph node removal (30)   Corneal abrasion, right 05/24/2022   Depression    GERD (gastroesophageal reflux disease)    H/O blood clots    upper rt groin and behind both knees   History of hiatal hernia    Hypertension    Lymphedema    right leg   Morphea scleroderma    PONV (postoperative nausea and vomiting)    states gets violently ill   Past Surgical History:  Procedure Laterality Date   BASAL CELL CARCINOMA EXCISION Left    CHOLECYSTECTOMY  2008   FLEXIBLE SIGMOIDOSCOPY N/A 05/19/2022   Procedure: FLEXIBLE SIGMOIDOSCOPY;  Surgeon: Toledo, Alphonsus Jeans, MD;  Location: [facility];  Service: Gastroenterology;  Laterality: N/A;   HERNIA REPAIR  2004   ILEOSTOMY CLOSURE N/A 08/24/2022   Procedure: ILEOSTOMY TAKEDOWN, open loop with Apolonio Bay, PA-C to assist;  Surgeon: Emmalene Hare, MD;  Location: [facility];  Service: General;  Laterality: N/A;   IVC FILTER INSERTION N/A 11/09/2023   Procedure: IVC FILTER INSERTION;  Surgeon: Celso College, MD;  Location: [facility];  Service: Cardiovascular;  Laterality: N/A;   IVC FILTER REMOVAL N/A 01/02/2024   Procedure: IVC FILTER REMOVAL;  Surgeon: Celso College, MD;  Location: [facility];  Service: Cardiovascular;  Laterality: N/A;   PLANTAR FASCIECTOMY     ROBOTIC ASSISTED TOTAL HYSTERECTOMY WITH BILATERAL SALPINGO OOPHERECTOMY  02/14/2012   ROTATOR CUFF REPAIR Left 03/24/2022   TONSILLECTOMY     TOTAL HIP ARTHROPLASTY Right 11/14/2023   Procedure: TOTAL HIP ARTHROPLASTY - posterior;  Surgeon: Venus Ginsberg, MD;  Location: [facility];  Service: Orthopedics;  Laterality: Right;   XI ROBOTIC ASSISTED LOWER ANTERIOR RESECTION N/A 05/24/2022   Procedure: XI ROBOTIC ASSISTED LOWER ANTERIOR RESECTION;  Surgeon: Emmalene Hare, MD;  Location: [facility];  Service: General;  Laterality: N/A;   Patient Active Problem List   Diagnosis Date Noted   Constipation 01/10/2024   Nausea & vomiting 01/10/2024   S/P  total right hip arthroplasty 11/14/2023   Blood coagulation disorder (HCC) 11/10/2023   PTSD (post-traumatic stress disorder) 11/10/2023   Severe episode of recurrent major depressive disorder, without psychotic features (HCC) 11/10/2023   GAD (generalized anxiety disorder) 11/10/2023   High risk medication use 11/10/2023   Anxiety and depression 03/22/2023   Cervical radiculopathy 03/16/2023   Colon cancer screening 03/15/2023   Chronic idiopathic constipation 03/15/2023   Dyspnea 03/07/2023   [place] spotted fever 03/07/2023   Strain of gastrocnemius tendon 03/07/2023   Lower abdominal pain 03/07/2023   Pain in pelvis 03/07/2023   Small bowel obstruction (HCC) 02/11/2023   Raynaud disease 02/11/2023   Polyarthralgia 02/11/2023   Edema 01/03/2023   Diverticular disease 12/10/2022   Hot flashes 12/10/2022   Pain of right lower extremity 12/10/2022   Paresthesia of lower extremity 12/10/2022   Primary insomnia 12/10/2022   Thyroid  nodule 12/10/2022   Multiple joint pain 12/10/2022    Basal cell carcinoma of skin 12/10/2022   Acute bilateral low back pain without sciatica 11/10/2022   Chronic lower back pain 09/05/2022   S/P closure of ileostomy 08/24/2022   Ileostomy in place [facility])    Morphea 07/13/2022   Colonic stricture (HCC) 05/19/2022   Large bowel obstruction (HCC) 05/19/2022   Hypokalemia 05/18/2022   Abdominal pain 05/18/2022   Osteoarthritis of left knee 02/24/2022   Mixed connective tissue disease (HCC) 02/17/2022   Adhesive capsulitis of left shoulder 01/27/2022   Glenohumeral arthritis, left 01/27/2022   Microscopic hematuria 01/12/2022   Otorrhagia of right ear 12/10/2021   Elevated testosterone level in female 11/04/2021   Anti-TPO antibodies present 10/20/2021   Rash 10/13/2021   Hashimoto thyroiditis, 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   Hashimoto's disease 03/23/2021   Exposure to severe acute respiratory syndrome coronavirus 2 (SARS-CoV-2) 12/19/2020   Migraine without aura and without status migrainosus, not intractable 08/19/2020   Myofascial pain 07/30/2020   Neck pain 07/30/2020   Upper back pain 07/30/2020   Cervical facet joint syndrome 07/30/2020   Hair loss 07/14/2020   Edema of lower extremity 07/11/2020   Adenomatous colon polyp 02/26/2020   Gastroesophageal reflux disease 02/15/2020   Diarrhea 02/15/2020   Sleep disorder 01/16/2020   Hyperlipidemia 07/11/2019   Right shoulder pain 04/04/2019   Pre-operative clearance 12/13/2018   Endometrial cancer (HCC) 04/05/2018   Patellofemoral pain syndrome of right knee 03/20/2018   Gastroenteritis 12/07/2017   Fatigue 11/16/2017   Morbid obesity (HCC) 08/11/2017   Stucco keratoses 08/11/2017   Essential hypertension 08/09/2017   History of endometrial cancer 08/09/2017   Lymphedema 08/09/2017   Menopause 08/09/2017   Malignant neoplastic disease (HCC) 04/07/2017   Cellulitis 09/18/2016   Benign hypertension 06/02/2016   Asthma 06[birth year]04/1969    PCP: Eather Golder, MD  REFERRING  PROVIDER: Eather Golder, MD  REFERRING DIAG: I89.0   THERAPY DIAG:  Lymphedema, not elsewhere classified  Rationale for Evaluation and Treatment: Rehabilitation  ONSET DATE: 2013  (Cancer-related, endometrial 2013)  SUBJECTIVE:  SUBJECTIVE STATEMENT:Patient returns to OT for lymphedema care after PT session. Pt states she is sore and winces when changing position during session. Pt does not rate pain numerically.   PERTINENT HISTORY: Asthma, Autoimmune Disease (Connective Tissue Disease), Endometrial Ca w/ LND ( 30 bilateral pelvic and periaortic LN), adjuvant chemotherapy and XRT,   H/O blood clots, RLE/RLQ lymphedema, Robotic assisted total hysterectomy w/ bilateral oophorectomy L Rotator Cuff repair, S/P ileostomy 2/2 colon stricture 05/2022; s/p R hip arthroplasty 11/11/23.   PAIN:  Are you having pain? YES. See subjective; not rated /10 Pain location: RLE, R hip, cervical spine Pain description: sore, aching, heavy, full, tight,  Aggravating factors: standing, walking, extended dependent sitting Relieving factors: elevation, movement  PRECAUTIONS: Fall and Other: LYMPHEDEMA  WEIGHT BEARING RESTRICTIONS: Yes 5# lifting restriction- shoulder  FALLS:  Has patient fallen in last 6 months? No  LIVING ENVIRONMENT: Lives with: lives with their spouse Lives in: House/apartment Stairs: No;  Has following equipment at home: Single point cane, Environmental consultant - 2 wheeled, Environmental consultant - 4 wheeled, and Wheelchair (manual)  OCCUPATION: retired Control and instrumentation engineer: gardening, hiking, biking, dancing- unable to participate in any of these due to pain and swelling  HAND DOMINANCE: right   PRIOR LEVEL OF FUNCTION: Independent with basic ADLs, Independent with household mobility without device, Independent with community  mobility without device, Requires assistive device for independence, Needs assistance with homemaking, Needs assistance with transfers, and Leisure: decreased  social participation for leisure pursuits due to impaired mobility and pain  PATIENT GOALS:  Be able to move freely without pain Reduce limb volume to be able to lift limb for basic daily ADLs and functional ambulation Reduce limb volume to increase ability to perform lower body dressing and bathing and grooming Reduce limb to increase body image  OBJECTIVE: Note: Objective measures were completed at Evaluation unless otherwise noted.  COGNITION:  Overall cognitive status: Within functional limits for tasks assessed   OBSERVATIONS / OTHER ASSESSMENTS:   POSTURE: WFL  LE ROM: WFL, but Limited mildly at R knee and ankle due to girth, skin approximation, and joint pain  LE MMT: WFL  Mild, Stage  II, Bilateral Lower Extremity Lymphedema 2/2 CVI and Obesity  Skin  Description Hyper-Keratosis Peau d' Orange Shiny Tight Fibrotic/ Indurated Fatty Doughy Spongy/ boggy   x x x x R>L Severe morphea at  R groin   x   Skin dry Flaky WNL Macerated   mildly sclerotic     Color Redness Varicosities Blanching Hemosiderin Stain Mottled   x x x   x   Odor Malodorous Yeast Fungal infection  WNL      x   Temperature Warm Cool wnl    x     Pitting Edema   1+ 2+ 3+ 4+ Non-pitting         x   Girth Symmetrical Asymmetrical                   Distribution    R>L RLE toes to groin    Stemmer Sign Positive Negative   +    Lymphorrhea History Of:  Present Absent     x    Wounds History Of Present Absent Venous Arterial Pressure Sheer     x        Signs of Infection Redness Warmth Erythema Acute Swelling Drainage Borders                    Sensation Light Touch  Deep pressure Hypersensitivity   In tact Impaired In tact Impaired Absent Impaired   x  x  x     Nails WNL   Fungus nail dystrophy   x     Hair Growth  Symmetrical Asymmetrical    R>L   Skin Creases Base of toes  Ankles   Base of Fingers knees       Abdominal pannus Thigh Lobules  Face/neck   x x  x        BLE COMPARATIVE LIMB VOLUMETRICS 10/10/23  LANDMARK RIGHT  10/10/23  R LEG (A-D) 5466.5 ml  R THIGH (E-G) 9186.6 ml  R FULL LIMB (A-G) 14653.1 ml  Limb Volume differential (LVD)  LVD for LEG = 44.1%, R>L LVD for THIGH= 33.98%, R>L LVD for FULL lower extremity 37.8%, R>L  Volume change since last 08/11/22 R LEG is INCREASED in volume by 59.6%. L THIGH is INCREASED by 38 %, and RLE full limb is INCREASED by 45.38%.  Volume change overall V  (Blank rows = not tested)  LANDMARK LEFT  10/12/23  L LEG (A-D) 3053.6 ml  L THIGH (E-G) 6064.8 ml  L  FULL LIMB (A-G) 9118.4 ml  Limb Volume differential (LVD)  %  Volume change since initial %  Volume change overall %  (Blank rows = not tested)   10 th VISIT PROGRESS NOTE RLE COMPARATIVE LIMB VOLUMETRICS 01/04/24: Deferred until next visit by Pt request.  LANDMARK RIGHT    R LEG (A-D) TBA  R THIGH (E-G) TBA  R FULL LIMB (A-G) TBA  Limb Volume differential (LVD)    Volume change since last 08/11/22 TBA  Volume change overall TBA  (Blank rows = not tested)  20 th VISIT PROGRESS NOTE RLE COMPARATIVE LIMB VOLUMETRICS TBA next session  LANDMARK RIGHT    R LEG (A-D) 6005.7 ml  R THIGH (E-G) 9400.3 ml  R FULL LIMB (A-G) 15406.02 ml  Limb Volume differential (LVD)    Volume change since last measured on 10/10/23 R LEG is INCREASED in volume by 9.9%. L THIGH is INCREASED by 2.32 %, and RLE full limb is INCREASED by 5.13% since last measured on 10/10/23.  Volume change since commencing OT for CDT %   ENDOMETRIAL CA-related LYMPHEDEMA Hx:  SURGERY TYPE/DATE: 2013 NUMBER OF LYMPH NODES REMOVED: 30 by report- bilateral pelvic and periaortic CHEMOTHERAPY: yes RADIATION:yes INFECTIONS: no hx cellulitis GAIT: Distance walked: >500 ft Assistive device utilized: single point cane Level of  assistance: Modified independence- extra time Comments: R limp  LYMPHEDEMA LIFE IMPACT SCALE (LLIS): Intake 10/12/23 75% (The extent to which lymphedema related problems impacted your life over the past week)  FOTO (functional outcome measure):10/10/23 INTAKE: 36% No Out take score. FOTO assessment discontinued by clinic.  PATIENT EDUCATION:  Education details: Continued Pt/ CG edu for lymphedema self care home program throughout session. Topics include outcome of comparative limb volumetrics- starting limb volume differentials (LVDs), technology and gradient techniques used for short stretch, multilayer compression wrapping, simple self-MLD, therapeutic lymphatic pumping exercises, skin/nail care, LE precautions,. compression garment recommendations and specifications, wear and care schedule and compression garment donning / doffing w assistive devices. Discussed progress towards all OT goals since commencing CDT. All questions answered to the Pt's satisfaction. Good return. Person educated: Patient Education method: Explanation, Demonstration, and Handouts Education comprehension: verbalized understanding, returned demonstration, and needs further education  LE SELF-CARE HOME  PROGRAM: Simple self-MLD/daily to affected quadrant and body part At least 2 x daily BLE Lymphatic  Pumping There ex 1 set of 10 reps each, in order, bilaterally Daily skin care to affected body part to limit infection risk and increase skin excursion Compression Bandaging Intensive stage compression: multilayer short stretch wraps with gradient techniques. One limb at a time. Length patient dependent. Self-management Phase: Appropriate daytime compression garment and hours-of-sleep device  Compression garments: Custom-made gradient compression garments and hours-of-sleep devices are medically necessary because they are uniquely sized and shaped to fit the exact dimensions of the affected extremities and to provide  accurate and consistent gradient compression and containment, essential  for optimally managing chronic, progressive lymphedema. The convoluted HOS devices are medically necessary to facilitate increased lymphatic circulation and limit fibrosis formation when sleeping. Multiple custom compression garments are needed for optimal hygiene to limit infection risk. Custom compression garments should be replaced q 3-6 months When worn consistently for optimal lipo-lymphedema self-management over time.  Pt will benefit from [id number]- Circaid reduction kit whole leg to increase independence with lymphedema self care, to  reduce limb volume  for body symmetry and balance, and to limit infection risk and progression.  ASSESSMENT:  CLINICAL IMPRESSION:  Progress is slow. Lymphedema management  with combination of progressive, secondary lymphedema with connective tissue disease is very stubborn and challenging.  R thigh has softened  marginally, but leg and ankle remain very hard and tight with dense    congestion. Pt tolerated MLD to RLE/RLQ without increased pain. We also used deep fibrosis techniques to distal leg and medial malleolus with palpable softening in tissue density after session.Applied multilayer compression wraps as established using gradient techniques.  Cont as per POC.  10/10/23 Lymphedema Episode 2, Initial OT Evaluation : Elizabeth Martin is a 77 y a female presenting with chronic, progressive, moderate, Stage II, RLE/RLQ cancer -related lymphedema with onset 11 years ago after Rx for endometrial cancer. Pt is well known to this therapist as she has successfully undergone Complete Decongestive Therapy (CDT)  this clinic bin the past. Pt reports recent exacerbation of RLE swelling worsened as inflammation in L hip has worsened over time.  Pt has a hip replacement scheduled in late December [facility]. RE limb volumetrics today reveal that R LEG volume is dramatically increased by 59.65 since last visit.  R LEG VOLUME is INCREASED in volume by 59.6%. L THIGH is INCREASED by 38 %, and RLE full limb is INCREASED by 45.38%. Pt presents with LE-related skin changes. Prolonged inflammation in the R hip joint has likely overloaded  already RLE/RLQ lymphatics.  RLE/RLQ lymphedema limits Pt's ability to perform basic and instrumental ADLs, including functional ambulation, mobility and transfers, grooming , lower body bathing and dressing, skin inspection and skin care. Pt has difficulty  reaching her lower legs and feet to apply compression wraps and don/doff        compression stockings. LE limits ability to P[perform instrumental ADLs, including driving, yard work and home management activities. Lymphedema limits her ability to participate in leisure pursuits and productive activities, and it negatively impacts body image, life roles and quality of life.   Pt will benefit from an Intensive and follow along course of lymphedema. CDT will consist of Manual Lymphatic gainage   (MLD), skin care, therapeutic exercise and compression, wraps initially then garments. Pt will need assistance throughout CDT   for applying compression wraps due to limited hip AROM and decreased skin flexibility. Without skilled Occupational Therapy for lymphedema care, lymphedema will progress and further functional decline is expected.   In prep  for upcoming hip replacement OT will educate Pt re assistive devices, including tub transfer bench and elevated toilet seat, in keeping with hip precautions.  OBJECTIVE IMPAIRMENTS: Abnormal gait, decreased activity tolerance, decreased balance, decreased knowledge of use of DME, decreased mobility, difficulty walking, decreased ROM, decreased strength, increased edema, decreased skin flexibility, increased fascial restrictions, impaired sensation, pain, and chronic, progressive LLE/LLQ lymphatic swelling and associated pain.   ADL LIMITATIONS: carrying, lifting, bending, sitting, standing,  squatting, sleeping, stairs, transfers, bed mobility, bathing, dressing, hygiene/grooming, and productive activities, leisure pursuits, social participation, body image  driving, shopping, housework, yard work, cooking, meal prep  PERSONAL FACTORS: Past/current experiences and 3+ comorbidities: Morphea Scleroderma, Mixed connective tissue disease, and osteoarthritis  are also affecting patient's functional outcome.   REHAB POTENTIAL: Good  EVALUATION COMPLEXITY: Moderate  GOALS: Goals reviewed with patient? Yes  SHORT TERM GOALS: Target date: 4th OT Rx visit   Pt will demonstrate understanding of lymphedema precautions and prevention strategies with modified independence using a printed reference to identify at least 5 precautions and discussing how s/he may implement them into daily life to reduce risk of progression with extra time.  Baseline:Max A Goal status: GOAL MET  2.  Pt will be able to apply multilayer, knee length, gradient, compression wraps to one leg at a time with modified assistance (extra time and assistive device/s) to decrease limb volume, to limit infection risk, and to limit lymphedema progression.  Baseline: Dependent Goal status: GOAL MET  LONG TERM GOALS: Target date: 01/09/24 (12 weeks)  Given this patient's Intake score 36% on the functional outcomes FOTO tool, patient will experience an increase in function of 3 points to improve basic and instrumental ADLs performance, including lymphedema self-care.  Baseline: Max A Goal status: DEFERRED as [employer] has been discontinued  2.  Given this patient's Intake score of  75% on the Lymphedema Life Impact Scale (LLIS), patient will experience a reduction of at least 5 points in her perceived level of functional impairment resulting from lymphedema to improve functional performance and quality of life (QOL). Baseline: % Goal status: PROGRESSING  3.  Pt will achieve at least a 10% volume reduction in full, RLE limb volume to  return limb to typical size and shape, to limit infection risk and LE progression, to decrease pain, to improve function. Baseline: Dependent Goal status:PROGRESSING.  Volumetrics deferred until next week to focus on manual therapy and relief of pain/discomfort. To date by visual assessment I would estimate no more than a 10% volume reduction. DENSITY IN THIGH HAS REDUCED only SLIGHTLY. Morphea at R groin continues to present a significant obstacle to limb volume decongestion and fibrosis reduction.  Progress is slow. Lymphedema management  with combination of progressive, secondary lymphedema with connective tissue disease is very stubborn and challenging.   4.  Pt will obtain proper compression garments/devices and achieve modified independence (extra time + assistive devices) with donning/doffing to optimize limb volume reductions and limit LE  progression over time. Baseline:  Goal status: 01/04/24: PARTIALLY MET; In an effort to reduce burden of care on her spouse, Pt obtained adjustable, Velcro style R full leg, Circaid leg reduction kit for use s/p THA, but this proved not effective for controlling swelling, and were not easier to don and doff more independently. We've resumed wrapping until she is able to fit back into custom compression garments. 02/14/24: Ongoing  5.  During Intensive phase CDT , with modified independence, Pt will achieve at least 85% compliance with all lymphedema  self-care home program components, including daily skin care, compression wraps and /or garments, simple self MLD and lymphatic pumping therex to habituate LE self care protocol  into ADLs for optimal LE self-management over time. Baseline: Dependent Goal status: 02/14/24: GOAL MET and EXCEEDED PLAN:  OT FREQUENCY: 2x/week  OT DURATION: 12 weeks and PRN  PLANNED INTERVENTIONS: Complete Decongestive Therapy (Intensive and supported Self-Management Phases), 97110-Therapeutic exercises, 97530- Therapeutic activity,  97535- Self Care, [id number]- Manual therapy, Patient/Family education, Taping, Manual lymph drainage, Scar mobilization, Compression bandaging, DME instructions, and skin care to reduce infection risk throughout manual therapy. Fit with replacement compression garments ASAP  PLAN FOR NEXT SESSION:  Complete RLE comparative limb volumetrics for progress note update Cont RLE/RLQ MLD Cont multilayer compression wrapping    Arnold Bicker, MS, OTR/L, CLT-LANA 03/26/24 11:32 AM  03/26/2024, 11:32 AM

## 2024-03-26 NOTE — Therapy (Signed)
 OUTPATIENT PHYSICAL THERAPY TREATMENT    Patient Name: Elizabeth Martin MRN: [id number] DOB:1961/09/17, 63 y.o., female Today's Date: 03/27/2024  END OF SESSION:  PT End of Session - 03/26/24 1034     Visit Number 15    Number of Visits 35    Date for PT Re-Evaluation 06/04/24    Authorization Type [employer]    Progress Note Due on Visit 20    PT Start Time 1015    PT Stop Time 1057    PT Time Calculation (min) 42 min    Equipment Utilized During Treatment Gait belt    Activity Tolerance Patient tolerated treatment well;No increased pain;Other (comment)   quick to faitgue in RLE  mm   Behavior During Therapy [facility] for tasks assessed/performed                Past Medical History:  Diagnosis Date   Anxiety    Asthma    Autoimmune disease (HCC)    Cancer (HCC)    Endometrial lymph node removal (30)   Corneal abrasion, right 05/24/2022   Depression    GERD (gastroesophageal reflux disease)    H/O blood clots    upper rt groin and behind both knees   History of hiatal hernia    Hypertension    Lymphedema    right leg   Morphea scleroderma    PONV (postoperative nausea and vomiting)    states gets violently ill   Past Surgical History:  Procedure Laterality Date   BASAL CELL CARCINOMA EXCISION Left    CHOLECYSTECTOMY  2008   FLEXIBLE SIGMOIDOSCOPY N/A 05/19/2022   Procedure: FLEXIBLE SIGMOIDOSCOPY;  Surgeon: Toledo, Alphonsus Jeans, MD;  Location: [facility];  Service: Gastroenterology;  Laterality: N/A;   HERNIA REPAIR  2004   ILEOSTOMY CLOSURE N/A 08/24/2022   Procedure: ILEOSTOMY TAKEDOWN, open loop with Apolonio Bay, PA-C to assist;  Surgeon: Emmalene Hare, MD;  Location: [facility] OR[facility];  Service: General;  Laterality: N/A;   IVC FILTER INSERTION N/A 11/09/2023   Procedure: IVC FILTER INSERTION;  Surgeon: Celso College, MD;  Location: [facility];  Service: Cardiovascular;  Laterality: N/A;   IVC FILTER REMOVAL N/A 01/02/2024   Procedure: IVC FILTER REMOVAL;   Surgeon: Celso College, MD;  Location: [facility];  Service: Cardiovascular;  Laterality: N/A;   PLANTAR FASCIECTOMY     ROBOTIC ASSISTED TOTAL HYSTERECTOMY WITH BILATERAL SALPINGO OOPHERECTOMY  02/14/2012   ROTATOR CUFF REPAIR Left 03/24/2022   TONSILLECTOMY     TOTAL HIP ARTHROPLASTY Right 11/14/2023   Procedure: TOTAL HIP ARTHROPLASTY - posterior;  Surgeon: Venus Ginsberg, MD;  Location: [facility] OR[facility];  Service: Orthopedics;  Laterality: Right;   XI ROBOTIC ASSISTED LOWER ANTERIOR RESECTION N/A 05/24/2022   Procedure: XI ROBOTIC ASSISTED LOWER ANTERIOR RESECTION;  Surgeon: Emmalene Hare, MD;  Location: [facility] OR[facility];  Service: General;  Laterality: N/A;   Patient Active Problem List   Diagnosis Date Noted   Constipation 01/10/2024   Nausea & vomiting 01/10/2024   S/P total right hip arthroplasty 11/14/2023   Blood coagulation disorder (HCC) 11/10/2023   PTSD (post-traumatic stress disorder) 11/10/2023   Severe episode of recurrent major depressive disorder, without psychotic features (HCC) 11/10/2023   GAD (generalized anxiety disorder) 11/10/2023   High risk medication use 11/10/2023   Anxiety and depression 03/22/2023   Cervical radiculopathy 03/16/2023   Colon cancer screening 03/15/2023   Chronic idiopathic constipation 03/15/2023   Dyspnea 03/07/2023   [facility] spotted fever 03/07/2023  Strain of gastrocnemius tendon 03/07/2023   Lower abdominal pain 03/07/2023   Pain in pelvis 03/07/2023   Small bowel obstruction (HCC) 02/11/2023   Raynaud disease 02/11/2023   Polyarthralgia 02/11/2023   Edema 01/03/2023   Diverticular disease 12/10/2022   Hot flashes 12/10/2022   Pain of right lower extremity 12/10/2022   Paresthesia of lower extremity 12/10/2022   Primary insomnia 12/10/2022   Thyroid  nodule 12/10/2022   Multiple joint pain 12/10/2022   Basal cell carcinoma of skin 12/10/2022   Acute bilateral low back pain without sciatica 11/10/2022   Chronic lower back  pain 09/05/2022   S/P closure of ileostomy 08/24/2022   Ileostomy in place [facility])    Morphea 07/13/2022   Colonic stricture (HCC) 05/19/2022   Large bowel obstruction (HCC) 05/19/2022   Hypokalemia 05/18/2022   Abdominal pain 05/18/2022   Osteoarthritis of left knee 02/24/2022   Mixed connective tissue disease (HCC) 02/17/2022   Adhesive capsulitis of left shoulder 01/27/2022   Glenohumeral arthritis, left 01/27/2022   Microscopic hematuria 01/12/2022   Otorrhagia of right ear 12/10/2021   Elevated testosterone level in female 11/04/2021   Anti-TPO antibodies present 10/20/2021   Rash 10/13/2021   Hashimoto thyroiditis, 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   Hashimoto's disease 03/23/2021   Exposure to severe acute respiratory syndrome coronavirus 2 (SARS-CoV-2) 12/19/2020   Migraine without aura and without status migrainosus, not intractable 08/19/2020   Myofascial pain 07/30/2020   Neck pain 07/30/2020   Upper back pain 07/30/2020   Cervical facet joint syndrome 07/30/2020   Hair loss 07/14/2020   Edema of lower extremity 07/11/2020   Adenomatous colon polyp 02/26/2020   Gastroesophageal reflux disease 02/15/2020   Diarrhea 02/15/2020   Sleep disorder 01/16/2020   Hyperlipidemia 07/11/2019   Right shoulder pain 04/04/2019   Pre-operative clearance 12/13/2018   Endometrial cancer (HCC) 04/05/2018   Patellofemoral pain syndrome of right knee 03/20/2018   Gastroenteritis 12/07/2017   Fatigue 11/16/2017   Morbid obesity (HCC) 08/11/2017   Stucco keratoses 08/11/2017   Essential hypertension 08/09/2017   History of endometrial cancer 08/09/2017   Lymphedema 08/09/2017   Menopause 08/09/2017   Malignant neoplastic disease (HCC) 04/07/2017   Cellulitis 09/18/2016   Benign hypertension 06/02/2016   Asthma 06/04/1969    PCP: Eather Golder MD   REFERRING PROVIDER: Eather Golder MD   REFERRING DIAG: s/p R hip replacement  THERAPY DIAG:  Stiffness of right hip, not elsewhere  classified  Pain in right hip  Difficulty in walking, not elsewhere classified  Muscle weakness (generalized)  Unsteadiness on feet  Rationale for Evaluation and Treatment: Rehabilitation  ONSET DATE: 11/14/23  SUBJECTIVE:   SUBJECTIVE STATEMENT:    Pt reports ongoing hip soreness - stiffness with prolonged sitting.      PERTINENT HISTORY: s/p R hip arthropaslasty 11/14/23 with Dr. Clyda Dark posterior approach, blood coagulation disorder, PTSD, anxiety, cervical radiculopathy dypsnea, polyarthralgia, Raynaud disease, s/p closure of ileostomy, Hashimoto thyroid , endometrial cancer, menopause, endometrial cancer, lymphedema. Patient had home health PT prior to PT here.   PAIN:  Are you having pain?  4/10 Rt hip joint pain, posterior portion of hip.    PRECAUTIONS: Other: recovering from hip sx  RED FLAGS: None   WEIGHT BEARING RESTRICTIONS: No  FALLS:  Has patient fallen in last 6 months? Yes. Number of falls 2  LIVING ENVIRONMENT: Lives with: lives with their spouse Lives in: House/apartment Stairs: No Has following equipment at home: Single point cane  OCCUPATION: retired,   PLOF: Independent  PATIENT  GOALS: return to hiking, working out, gardening.   NEXT MD VISIT: March 2025   OBJECTIVE:  Note: Objective measures were completed at Evaluation unless otherwise noted.  DIAGNOSTIC FINDINGS:   IMPRESSION: Right hip replacement.  No visible complicating feature.  PATIENT SURVEYS:  LEFS 5/80  COGNITION: Overall cognitive status: Within functional limits for tasks assessed    MUSCLE LENGTH: Hamstrings: limited bilaterally with R>L   LOWER EXTREMITY ROM:  Passive ROM Right eval Left eval  Hip flexion 48 flexion PROM supine   Hip extension    Hip abduction 5 degrees AROM     (Blank rows = not tested)  LOWER EXTREMITY MMT:  MMT Right eval Left eval  Hip flexion 2+ 5  Hip extension  5  Hip abduction 2+* painful 5  Hip adduction 2+ 5  Hip  internal rotation    Hip external rotation    Knee flexion 3 5  Knee extension 3 5  Ankle dorsiflexion 3 5  Ankle plantarflexion 3 5   (Blank rows = not tested)    FUNCTIONAL TESTS:  5 times sit to stand: 29 6 minute walk test:  10 meter walk test: 15 seconds with SPC   GAIT: Distance walked: 60 ft Assistive device utilized: Single point cane Level of assistance: CGA Comments: antalgic gait pattern with decreased stance time RLE. Slight vaulting pattern.  Stair negotiation: -step to pattern lead with LLE, one hand on cane one hand on rail ascending. Descending step to pattern down with RLE.                                                                                                                                 TREATMENT DATE:  03/26/2024  TA to improve BLE ROM and allow improved independence with ADLs and community mobility.   *Discussed only stretching within her posterior hip precautions until She sees MD on 04/10/2024.    Standing:  Hip circles (with leg straight) x 30  bouts CW/CCW each direction   Step up without UE supportSit to stand 2 x 10 with no UE support   Standing resisted hip abduction with RTB around ankle at support bar- start on left- 2-3 steps to right then repeat back to opp side x 10 Standing hip extension with straight leg, x20 each LE Standing ham curl, x20 each LE Standing calf raises x 20 reps BLE           PATIENT EDUCATION:  Education details: exercise technique Person educated: Patient Education method: Explanation, Demonstration, Tactile cues, and Verbal cues Education comprehension: verbalized understanding, returned demonstration, verbal cues required, tactile cues required, and needs further education  HOME EXERCISE PROGRAM: Access Code: [facility] URL: [email or web address] Date: 02/08/2024; updated 03/21/2024 Prepared by: [employer]  Exercises - Side Stepping with Counter Support  - 1 x  daily - 7 x weekly - 2 sets - 10 reps - 5 hold - Standing Hip  Extension  - 1 x daily - 7 x weekly - 2 sets - 10 reps - 5 hold - Standing March with Counter Support  - 1 x daily - 7 x weekly - 2 sets - 10 reps - 5 hold - Standing Hip Abduction with Counter Support  - 1 x daily - 7 x weekly - 2 sets - 10 reps - Seated Hamstring Stretch  - 1 x daily - 7 x weekly - 2 sets - 2 reps - 30 hold - Sit to Stand  - 1 x daily - 7 x weekly - 2 sets - 10 reps - 5 hold - Supine Heel Slide  - 1 x daily - 7 x weekly - 2 sets - 6 reps - Supine Gluteal Sets  - 1 x daily - 7 x weekly - 2 sets - 10 reps - Supine Bridge  - 1 x daily - 7 x weekly - 3 sets - 5 reps - 2-3sec hold - Supine Short Arc Quad  - 1 x daily - 7 x weekly - 3 sets - 15 reps - Small Range Straight Leg Raise  - 1 x daily - 7 x weekly - 3 sets - 5 reps - Hooklying Clamshell with Resistance  - 1 x daily - 7 x weekly - 3 sets - 15 reps - Prone Hip Extension with Bent Knee  - 1 x daily - 7 x weekly - 3 sets - 10 reps - Prone Hip Extension Sequence  - 1 x daily - 7 x weekly - 3 sets - 10 reps  ASSESSMENT: CLINICAL IMPRESSION:    No signs or symptoms of sciatica today. Treatment focused on Symptoms free activities design to promote increased strength and weight bearing for improved mobility. Patient performed well overall- able to return all demonstration properly without difficulty today. She was most limited today by overall fatigue.  Pt will continue to benefit from skilled therapy to address remaining deficits in order to improve overall QoL and return to PLOF.      OBJECTIVE IMPAIRMENTS: Abnormal gait, decreased activity tolerance, decreased balance, decreased coordination, decreased endurance, decreased mobility, difficulty walking, decreased ROM, decreased strength, hypomobility, increased edema, impaired perceived functional ability, impaired flexibility, improper body mechanics, postural dysfunction, and pain.   ACTIVITY LIMITATIONS:  carrying, lifting, bending, sitting, standing, squatting, sleeping, stairs, transfers, bed mobility, bathing, toileting, dressing, reach over head, hygiene/grooming, locomotion level, and caring for others  PARTICIPATION LIMITATIONS: meal prep, cleaning, laundry, interpersonal relationship, driving, shopping, community activity, and yard work  PERSONAL FACTORS: Age, Past/current experiences, Time since onset of injury/illness/exacerbation, and 3+ comorbidities: blood coagulation disorder, PTSD, anxiety, cervical radiculopathy dypsnea, polyarthralgia, Raynaud disease, s/p closure of ileostomy, Hashimoto thyroid , endometrial cancer, menopause, endometrial cancer, lymphedema  are also affecting patient's functional outcome.   REHAB POTENTIAL: Good  CLINICAL DECISION MAKING: Evolving/moderate complexity  EVALUATION COMPLEXITY: Moderate   GOALS: Goals reviewed with patient? Yes  SHORT TERM GOALS: Target date: 01/19/2024  Patient will be independent in home exercise program to improve strength/mobility for better functional independence with ADLs. Baseline:1/30: HEP given 3/12: pt reports completing 3-4 days a week depending on soreness.  Goal status:  MET  LONG TERM GOALS: Target date: 06/04/2024  Patient (> 63 years old) will complete five times sit to stand test in < 10 seconds indicating an increased LE strength and improved balance. Baseline: 1/30: 29 seconds 3/12: 19.41 03/12/2024= 17.44 seconds- without UE support Goal status: PROGRESSING  2.  Patient will increase six minute  walk test distance to >1000 for progression to community ambulator and improve gait ability Baseline: 430 ft in 5 min and required seated rest to end test due to fatigue, uses SPC 875ft with SPC and no standing or seated rest break; 03/12/2024= 950 feet with SPC Goal status: in progress   3.  Patient will increase 10 meter walk test to >1.67m/s as to improve gait speed for better community ambulation and to reduce  fall risk. Baseline: 1/30: 15 seconds with SPC  Without SPC: 10.63 and 10.38 speed: 0.95 m/s; with SPC 10.98 and 11.18; 03/12/2024= 1.0 m/s  Goal status: PROGRESSING  4.  Patient will increase lower extremity functional scale to >60/80 to demonstrate improved functional mobility and increased tolerance with ADLs.  Baseline: 1/30: 5/80 3/12: 20/80; 03/12/2024= 29/80 Goal status: Progressing  PLAN:  PT FREQUENCY: 1-2x/week PT DURATION: 12 weeks PLANNED INTERVENTIONS: 97164- PT Re-evaluation, 97110-Therapeutic exercises, 97530- Therapeutic activity, 97112- Neuromuscular re-education, 97535- Self Care, 09811- Manual therapy, 97116- Gait training, 97760- Splinting, 97014- Electrical stimulation (unattended), [id number]- Electrical stimulation (manual), [id number]- Ultrasound, 91478- Traction (mechanical), Patient/Family education, Balance training, Stair training, Taping, Joint mobilization, Joint manipulation, Spinal manipulation, Spinal mobilization, Manual lymph drainage, Scar mobilization, Compression bandaging, Vestibular training, Visual/preceptual remediation/compensation, DME instructions, Cryotherapy, and Moist heat  PLAN FOR NEXT SESSION:   Continue with progressive LE strengthening and dynamic balance interventions as appropriate.     Ossie Blend, PT Physical Therapist - [facility]  03/27/24, 7:40 AM

## 2024-03-28 ENCOUNTER — Ambulatory Visit

## 2024-03-28 ENCOUNTER — Ambulatory Visit: Admitting: Occupational Therapy

## 2024-03-30 ENCOUNTER — Ambulatory Visit: Admitting: Occupational Therapy

## 2024-03-30 ENCOUNTER — Encounter: Payer: Self-pay | Admitting: Occupational Therapy

## 2024-03-30 DIAGNOSIS — I890 Lymphedema, not elsewhere classified: Secondary | ICD-10-CM | POA: Diagnosis not present

## 2024-03-30 NOTE — Therapy (Signed)
 OUTPATIENT OCCUPATIONAL THERAPY TREATMENT NOTE  LOWER EXTREMITY LYMPHEDEMA  Patient Name: Elizabeth Martin MRN: [id number] DOB:1961-11-12, 63 y.o., female Today's Date: 03/30/2024  REPORTING PERIOD:   END OF SESSION:  Lymphedema Episode 2   OT End of Session - 03/30/24 0805     Visit Number 26    Number of Visits 36    Date for OT Re-Evaluation 04/16/24    OT Start Time 0800    OT Stop Time 0905    OT Time Calculation (min) 65 min    Activity Tolerance Patient tolerated treatment well;No increased pain    Behavior During Therapy WFL for tasks assessed/performed              Past Medical History:  Diagnosis Date   Anxiety    Asthma    Autoimmune disease (HCC)    Cancer (HCC)    Endometrial lymph node removal (30)   Corneal abrasion, right 05/24/2022   Depression    GERD (gastroesophageal reflux disease)    H/O blood clots    upper rt groin and behind both knees   History of hiatal hernia    Hypertension    Lymphedema    right leg   Morphea scleroderma    PONV (postoperative nausea and vomiting)    states gets violently ill   Past Surgical History:  Procedure Laterality Date   BASAL CELL CARCINOMA EXCISION Left    CHOLECYSTECTOMY  2008   FLEXIBLE SIGMOIDOSCOPY N/A 05/19/2022   Procedure: FLEXIBLE SIGMOIDOSCOPY;  Surgeon: Toledo, Alphonsus Jeans, MD;  Location: [facility];  Service: Gastroenterology;  Laterality: N/A;   HERNIA REPAIR  2004   ILEOSTOMY CLOSURE N/A 08/24/2022   Procedure: ILEOSTOMY TAKEDOWN, open loop with Apolonio Bay, PA-C to assist;  Surgeon: Emmalene Hare, MD;  Location: [facility];  Service: General;  Laterality: N/A;   IVC FILTER INSERTION N/A 11/09/2023   Procedure: IVC FILTER INSERTION;  Surgeon: Celso College, MD;  Location: [facility];  Service: Cardiovascular;  Laterality: N/A;   IVC FILTER REMOVAL N/A 01/02/2024   Procedure: IVC FILTER REMOVAL;  Surgeon: Celso College, MD;  Location: [facility];  Service:  Cardiovascular;  Laterality: N/A;   PLANTAR FASCIECTOMY     ROBOTIC ASSISTED TOTAL HYSTERECTOMY WITH BILATERAL SALPINGO OOPHERECTOMY  02/14/2012   ROTATOR CUFF REPAIR Left 03/24/2022   TONSILLECTOMY     TOTAL HIP ARTHROPLASTY Right 11/14/2023   Procedure: TOTAL HIP ARTHROPLASTY - posterior;  Surgeon: Venus Ginsberg, MD;  Location: [facility];  Service: Orthopedics;  Laterality: Right;   XI ROBOTIC ASSISTED LOWER ANTERIOR RESECTION N/A 05/24/2022   Procedure: XI ROBOTIC ASSISTED LOWER ANTERIOR RESECTION;  Surgeon: Emmalene Hare, MD;  Location: [facility];  Service: General;  Laterality: N/A;   Patient Active Problem List   Diagnosis Date Noted   Constipation 01/10/2024   Nausea & vomiting 01/10/2024   S/P total right hip arthroplasty 11/14/2023   Blood coagulation disorder (HCC) 11/10/2023   PTSD (post-traumatic stress disorder) 11/10/2023   Severe episode of recurrent major depressive disorder, without psychotic features (HCC) 11/10/2023   GAD (generalized anxiety disorder) 11/10/2023   High risk medication use 11/10/2023   Anxiety and depression 03/22/2023   Cervical radiculopathy 03/16/2023   Colon cancer screening 03/15/2023   Chronic idiopathic constipation 03/15/2023   Dyspnea 03/07/2023   [facility] spotted fever 03/07/2023   Strain of gastrocnemius tendon 03/07/2023   Lower abdominal pain 03/07/2023   Pain in pelvis 03/07/2023   Small bowel  obstruction (HCC) 02/11/2023   Raynaud disease 02/11/2023   Polyarthralgia 02/11/2023   Edema 01/03/2023   Diverticular disease 12/10/2022   Hot flashes 12/10/2022   Pain of right lower extremity 12/10/2022   Paresthesia of lower extremity 12/10/2022   Primary insomnia 12/10/2022   Thyroid  nodule 12/10/2022   Multiple joint pain 12/10/2022   Basal cell carcinoma of skin 12/10/2022   Acute bilateral low back pain without sciatica 11/10/2022   Chronic lower back pain 09/05/2022   S/P closure of ileostomy 08/24/2022   Ileostomy in  place [facility])    Morphea 07/13/2022   Colonic stricture (HCC) 05/19/2022   Large bowel obstruction (HCC) 05/19/2022   Hypokalemia 05/18/2022   Abdominal pain 05/18/2022   Osteoarthritis of left knee 02/24/2022   Mixed connective tissue disease (HCC) 02/17/2022   Adhesive capsulitis of left shoulder 01/27/2022   Glenohumeral arthritis, left 01/27/2022   Microscopic hematuria 01/12/2022   Otorrhagia of right ear 12/10/2021   Elevated testosterone level in female 11/04/2021   Anti-TPO antibodies present 10/20/2021   Rash 10/13/2021   Hashimoto thyroiditis, 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   Hashimoto's disease 03/23/2021   Exposure to severe acute respiratory syndrome coronavirus 2 (SARS-CoV-2) 12/19/2020   Migraine without aura and without status migrainosus, not intractable 08/19/2020   Myofascial pain 07/30/2020   Neck pain 07/30/2020   Upper back pain 07/30/2020   Cervical facet joint syndrome 07/30/2020   Hair loss 07/14/2020   Edema of lower extremity 07/11/2020   Adenomatous colon polyp 02/26/2020   Gastroesophageal reflux disease 02/15/2020   Diarrhea 02/15/2020   Sleep disorder 01/16/2020   Hyperlipidemia 07/11/2019   Right shoulder pain 04/04/2019   Pre-operative clearance 12/13/2018   Endometrial cancer (HCC) 04/05/2018   Patellofemoral pain syndrome of right knee 03/20/2018   Gastroenteritis 12/07/2017   Fatigue 11/16/2017   Morbid obesity (HCC) 08/11/2017   Stucco keratoses 08/11/2017   Essential hypertension 08/09/2017   History of endometrial cancer 08/09/2017   Lymphedema 08/09/2017   Menopause 08/09/2017   Malignant neoplastic disease (HCC) 04/07/2017   Cellulitis 09/18/2016   Benign hypertension 06/02/2016   Asthma 06/04/1969    PCP: Eather Golder, MD  REFERRING PROVIDER: Eather Golder, MD  REFERRING DIAG: I89.0   THERAPY DIAG:  Lymphedema, not elsewhere classified  Rationale for Evaluation and Treatment: Rehabilitation  ONSET DATE: 2013   (Cancer-related, endometrial 2013)  SUBJECTIVE:                                                                                                                                                                                           SUBJECTIVE STATEMENT:Patient returns to OT for lymphedema  care to RLE/RLQ. Pt states she is sore and winces when changing position during session. Pt does not rate pain numerically.   PERTINENT HISTORY: Asthma, Autoimmune Disease (Connective Tissue Disease), Endometrial Ca w/ LND ( 30 bilateral pelvic and periaortic LN), adjuvant chemotherapy and XRT,   H/O blood clots, RLE/RLQ lymphedema, Robotic assisted total hysterectomy w/ bilateral oophorectomy L Rotator Cuff repair, S/P ileostomy 2/2 colon stricture 05/2022; s/p R hip arthroplasty 11/11/23.   PAIN:  Are you having pain? YES. See subjective; not rated /10 Pain location: RLE, R hip, cervical spine Pain description: sore, aching, heavy, full, tight,  Aggravating factors: standing, walking, extended dependent sitting Relieving factors: elevation, movement  PRECAUTIONS: Fall and Other: LYMPHEDEMA  WEIGHT BEARING RESTRICTIONS: Yes 5# lifting restriction- shoulder  FALLS:  Has patient fallen in last 6 months? No  LIVING ENVIRONMENT: Lives with: lives with their spouse Lives in: House/apartment Stairs: No;  Has following equipment at home: Single point cane, Environmental consultant - 2 wheeled, Environmental consultant - 4 wheeled, and Wheelchair (manual)  OCCUPATION: retired Control and instrumentation engineer: gardening, hiking, biking, dancing- unable to participate in any of these due to pain and swelling  HAND DOMINANCE: right   PRIOR LEVEL OF FUNCTION: Independent with basic ADLs, Independent with household mobility without device, Independent with community mobility without device, Requires assistive device for independence, Needs assistance with homemaking, Needs assistance with transfers, and Leisure: decreased  social participation for leisure  pursuits due to impaired mobility and pain  PATIENT GOALS:  Be able to move freely without pain Reduce limb volume to be able to lift limb for basic daily ADLs and functional ambulation Reduce limb volume to increase ability to perform lower body dressing and bathing and grooming Reduce limb to increase body image  OBJECTIVE: Note: Objective measures were completed at Evaluation unless otherwise noted.  COGNITION:  Overall cognitive status: Within functional limits for tasks assessed   OBSERVATIONS / OTHER ASSESSMENTS:   POSTURE: WFL  LE ROM: WFL, but Limited mildly at R knee and ankle due to girth, skin approximation, and joint pain  LE MMT: WFL  Mild, Stage  II, Bilateral Lower Extremity Lymphedema 2/2 CVI and Obesity  Skin  Description Hyper-Keratosis Peau d' Orange Shiny Tight Fibrotic/ Indurated Fatty Doughy Spongy/ boggy   x x x x R>L Severe morphea at  R groin   x   Skin dry Flaky WNL Macerated   mildly sclerotic     Color Redness Varicosities Blanching Hemosiderin Stain Mottled   x x x   x   Odor Malodorous Yeast Fungal infection  WNL      x   Temperature Warm Cool wnl    x     Pitting Edema   1+ 2+ 3+ 4+ Non-pitting         x   Girth Symmetrical Asymmetrical                   Distribution    R>L RLE toes to groin    Stemmer Sign Positive Negative   +    Lymphorrhea History Of:  Present Absent     x    Wounds History Of Present Absent Venous Arterial Pressure Sheer     x        Signs of Infection Redness Warmth Erythema Acute Swelling Drainage Borders                    Sensation Light Touch Deep pressure Hypersensitivity   In tact Impaired  In tact Impaired Absent Impaired   x  x  x     Nails WNL   Fungus nail dystrophy   x     Hair Growth Symmetrical Asymmetrical    R>L   Skin Creases Base of toes  Ankles   Base of Fingers knees       Abdominal pannus Thigh Lobules  Face/neck   x x  x        BLE COMPARATIVE LIMB  VOLUMETRICS 10/10/23  LANDMARK RIGHT  10/10/23  R LEG (A-D) 5466.5 ml  R THIGH (E-G) 9186.6 ml  R FULL LIMB (A-G) 14653.1 ml  Limb Volume differential (LVD)  LVD for LEG = 44.1%, R>L LVD for THIGH= 33.98%, R>L LVD for FULL lower extremity 37.8%, R>L  Volume change since last 08/11/22 R LEG is INCREASED in volume by 59.6%. L THIGH is INCREASED by 38 %, and RLE full limb is INCREASED by 45.38%.  Volume change overall V  (Blank rows = not tested)  LANDMARK LEFT  10/12/23  L LEG (A-D) 3053.6 ml  L THIGH (E-G) 6064.8 ml  L  FULL LIMB (A-G) 9118.4 ml  Limb Volume differential (LVD)  %  Volume change since initial %  Volume change overall %  (Blank rows = not tested)   10 th VISIT PROGRESS NOTE RLE COMPARATIVE LIMB VOLUMETRICS 01/04/24: Deferred until next visit by Pt request.  LANDMARK RIGHT    R LEG (A-D) TBA  R THIGH (E-G) TBA  R FULL LIMB (A-G) TBA  Limb Volume differential (LVD)    Volume change since last 08/11/22 TBA  Volume change overall TBA  (Blank rows = not tested)  20 th VISIT PROGRESS NOTE RLE COMPARATIVE LIMB VOLUMETRICS TBA next session  LANDMARK RIGHT    R LEG (A-D) 6005.7 ml  R THIGH (E-G) 9400.3 ml  R FULL LIMB (A-G) 15406.02 ml  Limb Volume differential (LVD)    Volume change since last measured on 10/10/23 R LEG is INCREASED in volume by 9.9%. L THIGH is INCREASED by 2.32 %, and RLE full limb is INCREASED by 5.13% since last measured on 10/10/23.  Volume change since commencing OT for CDT %   ENDOMETRIAL CA-related LYMPHEDEMA Hx:  SURGERY TYPE/DATE: 2013 NUMBER OF LYMPH NODES REMOVED: 30 by report- bilateral pelvic and periaortic CHEMOTHERAPY: yes RADIATION:yes INFECTIONS: no hx cellulitis GAIT: Distance walked: >500 ft Assistive device utilized: single point cane Level of assistance: Modified independence- extra time Comments: R limp  LYMPHEDEMA LIFE IMPACT SCALE (LLIS): Intake 10/12/23 75% (The extent to which lymphedema related problems impacted your life  over the past week)  FOTO (functional outcome measure):10/10/23 INTAKE: 36% No Out take score. FOTO assessment discontinued by clinic.  PATIENT EDUCATION:  Education details: Continued Pt/ CG edu for lymphedema self care home program throughout session. Topics include outcome of comparative limb volumetrics- starting limb volume differentials (LVDs), technology and gradient techniques used for short stretch, multilayer compression wrapping, simple self-MLD, therapeutic lymphatic pumping exercises, skin/nail care, LE precautions,. compression garment recommendations and specifications, wear and care schedule and compression garment donning / doffing w assistive devices. Discussed progress towards all OT goals since commencing CDT. All questions answered to the Pt's satisfaction. Good return. Person educated: Patient Education method: Explanation, Demonstration, and Handouts Education comprehension: verbalized understanding, returned demonstration, and needs further education  LE SELF-CARE HOME  PROGRAM: Simple self-MLD/daily to affected quadrant and body part At least 2 x daily BLE Lymphatic Pumping There ex 1 set of 10 reps  each, in order, bilaterally Daily skin care to affected body part to limit infection risk and increase skin excursion Compression Bandaging Intensive stage compression: multilayer short stretch wraps with gradient techniques. One limb at a time. Length patient dependent. Self-management Phase: Appropriate daytime compression garment and hours-of-sleep device  Compression garments: Custom-made gradient compression garments and hours-of-sleep devices are medically necessary because they are uniquely sized and shaped to fit the exact dimensions of the affected extremities and to provide accurate and consistent gradient compression and containment, essential  for optimally managing chronic, progressive lymphedema. The convoluted HOS devices are medically necessary to facilitate  increased lymphatic circulation and limit fibrosis formation when sleeping. Multiple custom compression garments are needed for optimal hygiene to limit infection risk. Custom compression garments should be replaced q 3-6 months When worn consistently for optimal lipo-lymphedema self-management over time.  Pt will benefit from [id number]- Circaid reduction kit whole leg to increase independence with lymphedema self care, to  reduce limb volume  for body symmetry and balance, and to limit infection risk and progression.  ASSESSMENT:  CLINICAL IMPRESSION:  Progress towards limb volume reduction remain slow. Lymphedema management  is very challenging as Pt experiences frequent flare ups of systemic inflammation 2/2 mixed connective tissue disease. R thigh has softened minimally, but leg and ankle remain very hard and tight with dense, high protein, lymphatic congestion. Pt tolerated MLD to RLE/RLQ without increased pain. We also used deep fibrosis techniques to distal leg and medial malleolus with palpable softening in tissue density after session.Applied multilayer compression wraps as established using gradient techniques.  Cont as per POC.  10/10/23 Lymphedema Episode 2, Initial OT Evaluation : Amberrose Friebel is a 51 y a female presenting with chronic, progressive, moderate, Stage II, RLE/RLQ cancer -related lymphedema with onset 11 years ago after Rx for endometrial cancer. Pt is well known to this therapist as she has successfully undergone Complete Decongestive Therapy (CDT)  this clinic bin the past. Pt reports recent exacerbation of RLE swelling worsened as inflammation in L hip has worsened over time.  Pt has a hip replacement scheduled in late December here [facility]. RE limb volumetrics today reveal that R LEG volume is dramatically increased by 59.65 since last visit. R LEG VOLUME is INCREASED in volume by 59.6%. L THIGH is INCREASED by 38 %, and RLE full limb is INCREASED by 45.38%. Pt presents with  LE-related skin changes. Prolonged inflammation in the R hip joint has likely overloaded  already RLE/RLQ lymphatics.  RLE/RLQ lymphedema limits Pt's ability to perform basic and instrumental ADLs, including functional ambulation, mobility and transfers, grooming , lower body bathing and dressing, skin inspection and skin care. Pt has difficulty  reaching her lower legs and feet to apply compression wraps and don/doff        compression stockings. LE limits ability to P[perform instrumental ADLs, including driving, yard work and home management activities. Lymphedema limits her ability to participate in leisure pursuits and productive activities, and it negatively impacts body image, life roles and quality of life.   Pt will benefit from an Intensive and follow along course of lymphedema. CDT will consist of Manual Lymphatic gainage   (MLD), skin care, therapeutic exercise and compression, wraps initially then garments. Pt will need assistance throughout CDT   for applying compression wraps due to limited hip AROM and decreased skin flexibility. Without skilled Occupational Therapy for lymphedema care, lymphedema will progress and further functional decline is expected.   In prep for upcoming hip replacement  OT will educate Pt re assistive devices, including tub transfer bench and elevated toilet seat, in keeping with hip precautions.  OBJECTIVE IMPAIRMENTS: Abnormal gait, decreased activity tolerance, decreased balance, decreased knowledge of use of DME, decreased mobility, difficulty walking, decreased ROM, decreased strength, increased edema, decreased skin flexibility, increased fascial restrictions, impaired sensation, pain, and chronic, progressive LLE/LLQ lymphatic swelling and associated pain.   ADL LIMITATIONS: carrying, lifting, bending, sitting, standing, squatting, sleeping, stairs, transfers, bed mobility, bathing, dressing, hygiene/grooming, and productive activities, leisure pursuits, social  participation, body image  driving, shopping, housework, yard work, cooking, meal prep  PERSONAL FACTORS: Past/current experiences and 3+ comorbidities: Morphea Scleroderma, Mixed connective tissue disease, and osteoarthritis  are also affecting patient's functional outcome.   REHAB POTENTIAL: Good  EVALUATION COMPLEXITY: Moderate  GOALS: Goals reviewed with patient? Yes  SHORT TERM GOALS: Target date: 4th OT Rx visit   Pt will demonstrate understanding of lymphedema precautions and prevention strategies with modified independence using a printed reference to identify at least 5 precautions and discussing how s/he may implement them into daily life to reduce risk of progression with extra time.  Baseline:Max A Goal status: GOAL MET  2.  Pt will be able to apply multilayer, knee length, gradient, compression wraps to one leg at a time with modified assistance (extra time and assistive device/s) to decrease limb volume, to limit infection risk, and to limit lymphedema progression.  Baseline: Dependent Goal status: GOAL MET  LONG TERM GOALS: Target date: 01/09/24 (12 weeks)  Given this patient's Intake score 36% on the functional outcomes FOTO tool, patient will experience an increase in function of 3 points to improve basic and instrumental ADLs performance, including lymphedema self-care.  Baseline: Max A Goal status: DEFERRED as [employer] has been discontinued  2.  Given this patient's Intake score of  75% on the Lymphedema Life Impact Scale (LLIS), patient will experience a reduction of at least 5 points in her perceived level of functional impairment resulting from lymphedema to improve functional performance and quality of life (QOL). Baseline: % Goal status: PROGRESSING  3.  Pt will achieve at least a 10% volume reduction in full, RLE limb volume to return limb to typical size and shape, to limit infection risk and LE progression, to decrease pain, to improve function. Baseline:  Dependent Goal status:PROGRESSING.  Volumetrics deferred until next week to focus on manual therapy and relief of pain/discomfort. To date by visual assessment I would estimate no more than a 10% volume reduction. DENSITY IN THIGH HAS REDUCED only SLIGHTLY. Morphea at R groin continues to present a significant obstacle to limb volume decongestion and fibrosis reduction.  Progress is slow. Lymphedema management  with combination of progressive, secondary lymphedema with connective tissue disease is very stubborn and challenging.   4.  Pt will obtain proper compression garments/devices and achieve modified independence (extra time + assistive devices) with donning/doffing to optimize limb volume reductions and limit LE  progression over time. Baseline:  Goal status: 01/04/24: PARTIALLY MET; In an effort to reduce burden of care on her spouse, Pt obtained adjustable, Velcro style R full leg, Circaid leg reduction kit for use s/p THA, but this proved not effective for controlling swelling, and were not easier to don and doff more independently. We've resumed wrapping until she is able to fit back into custom compression garments. 02/14/24: Ongoing  5.  During Intensive phase CDT , with modified independence, Pt will achieve at least 85% compliance with all lymphedema self-care home program components,  including daily skin care, compression wraps and /or garments, simple self MLD and lymphatic pumping therex to habituate LE self care protocol  into ADLs for optimal LE self-management over time. Baseline: Dependent Goal status: 02/14/24: GOAL MET and EXCEEDED PLAN:  OT FREQUENCY: 2x/week  OT DURATION: 12 weeks and PRN  PLANNED INTERVENTIONS: Complete Decongestive Therapy (Intensive and supported Self-Management Phases), 97110-Therapeutic exercises, 97530- Therapeutic activity, 97535- Self Care, 40981- Manual therapy, Patient/Family education, Taping, Manual lymph drainage, Scar mobilization, Compression  bandaging, DME instructions, and skin care to reduce infection risk throughout manual therapy. Fit with replacement compression garments ASAP  PLAN FOR NEXT SESSION:  Cont RLE/RLQ MLD Cont multilayer compression wrapping Manufacturer's rep here to assist with custom garment measuring. Consider multi-pieces- bike shorts over thigh high Capri and knee high  Arnold Bicker, MS, OTR/L, CLT-LANA 03/30/24 9:14 AM

## 2024-04-02 ENCOUNTER — Ambulatory Visit: Admitting: Occupational Therapy

## 2024-04-02 ENCOUNTER — Ambulatory Visit

## 2024-04-02 DIAGNOSIS — R262 Difficulty in walking, not elsewhere classified: Secondary | ICD-10-CM

## 2024-04-02 DIAGNOSIS — M25551 Pain in right hip: Secondary | ICD-10-CM

## 2024-04-02 DIAGNOSIS — M6281 Muscle weakness (generalized): Secondary | ICD-10-CM

## 2024-04-02 DIAGNOSIS — R2681 Unsteadiness on feet: Secondary | ICD-10-CM

## 2024-04-02 DIAGNOSIS — M25651 Stiffness of right hip, not elsewhere classified: Secondary | ICD-10-CM

## 2024-04-02 DIAGNOSIS — I890 Lymphedema, not elsewhere classified: Secondary | ICD-10-CM | POA: Diagnosis not present

## 2024-04-02 NOTE — Therapy (Signed)
 OUTPATIENT PHYSICAL THERAPY TREATMENT    Patient Name: Elizabeth Martin MRN: [id number] DOB:20-Jun-1961, 63 y.o., female Today's Date: 04/02/2024  END OF SESSION:  PT End of Session - 04/02/24 1026     Visit Number 16    Number of Visits 35    Date for PT Re-Evaluation 06/04/24    Authorization Type [employer]    Progress Note Due on Visit 20    PT Start Time 1015    PT Stop Time 1055    PT Time Calculation (min) 40 min    Activity Tolerance Patient tolerated treatment well    Behavior During Therapy WFL for tasks assessed/performed                 Past Medical History:  Diagnosis Date   Anxiety    Asthma    Autoimmune disease (HCC)    Cancer (HCC)    Endometrial lymph node removal (30)   Corneal abrasion, right 05/24/2022   Depression    GERD (gastroesophageal reflux disease)    H/O blood clots    upper rt groin and behind both knees   History of hiatal hernia    Hypertension    Lymphedema    right leg   Morphea scleroderma    PONV (postoperative nausea and vomiting)    states gets violently ill   Past Surgical History:  Procedure Laterality Date   BASAL CELL CARCINOMA EXCISION Left    CHOLECYSTECTOMY  2008   FLEXIBLE SIGMOIDOSCOPY N/A 05/19/2022   Procedure: FLEXIBLE SIGMOIDOSCOPY;  Surgeon: Toledo, Alphonsus Jeans, MD;  Location: [facility];  Service: Gastroenterology;  Laterality: N/A;   HERNIA REPAIR  2004   ILEOSTOMY CLOSURE N/A 08/24/2022   Procedure: ILEOSTOMY TAKEDOWN, open loop with Apolonio Bay, PA-C to assist;  Surgeon: Emmalene Hare, MD;  Location: [facility];  Service: General;  Laterality: N/A;   IVC FILTER INSERTION N/A 11/09/2023   Procedure: IVC FILTER INSERTION;  Surgeon: Celso College, MD;  Location: [facility];  Service: Cardiovascular;  Laterality: N/A;   IVC FILTER REMOVAL N/A 01/02/2024   Procedure: IVC FILTER REMOVAL;  Surgeon: Celso College, MD;  Location: [facility];  Service: Cardiovascular;  Laterality: N/A;    PLANTAR FASCIECTOMY     ROBOTIC ASSISTED TOTAL HYSTERECTOMY WITH BILATERAL SALPINGO OOPHERECTOMY  02/14/2012   ROTATOR CUFF REPAIR Left 03/24/2022   TONSILLECTOMY     TOTAL HIP ARTHROPLASTY Right 11/14/2023   Procedure: TOTAL HIP ARTHROPLASTY - posterior;  Surgeon: Venus Ginsberg, MD;  Location: [facility];  Service: Orthopedics;  Laterality: Right;   XI ROBOTIC ASSISTED LOWER ANTERIOR RESECTION N/A 05/24/2022   Procedure: XI ROBOTIC ASSISTED LOWER ANTERIOR RESECTION;  Surgeon: Emmalene Hare, MD;  Location: [facility];  Service: General;  Laterality: N/A;   Patient Active Problem List   Diagnosis Date Noted   Constipation 01/10/2024   Nausea & vomiting 01/10/2024   S/P total right hip arthroplasty 11/14/2023   Blood coagulation disorder (HCC) 11/10/2023   PTSD (post-traumatic stress disorder) 11/10/2023   Severe episode of recurrent major depressive disorder, without psychotic features (HCC) 11/10/2023   GAD (generalized anxiety disorder) 11/10/2023   High risk medication use 11/10/2023   Anxiety and depression 03/22/2023   Cervical radiculopathy 03/16/2023   Colon cancer screening 03/15/2023   Chronic idiopathic constipation 03/15/2023   Dyspnea 03/07/2023   [facility] spotted fever 03/07/2023   Strain of gastrocnemius tendon 03/07/2023   Lower abdominal pain 03/07/2023   Pain in pelvis 03/07/2023  Small bowel obstruction (HCC) 02/11/2023   Raynaud disease 02/11/2023   Polyarthralgia 02/11/2023   Edema 01/03/2023   Diverticular disease 12/10/2022   Hot flashes 12/10/2022   Pain of right lower extremity 12/10/2022   Paresthesia of lower extremity 12/10/2022   Primary insomnia 12/10/2022   Thyroid  nodule 12/10/2022   Multiple joint pain 12/10/2022   Basal cell carcinoma of skin 12/10/2022   Acute bilateral low back pain without sciatica 11/10/2022   Chronic lower back pain 09/05/2022   S/P closure of ileostomy 08/24/2022   Ileostomy in place [facility])    Morphea 07/13/2022    Colonic stricture (HCC) 05/19/2022   Large bowel obstruction (HCC) 05/19/2022   Hypokalemia 05/18/2022   Abdominal pain 05/18/2022   Osteoarthritis of left knee 02/24/2022   Mixed connective tissue disease (HCC) 02/17/2022   Adhesive capsulitis of left shoulder 01/27/2022   Glenohumeral arthritis, left 01/27/2022   Microscopic hematuria 01/12/2022   Otorrhagia of right ear 12/10/2021   Elevated testosterone level in female 11/04/2021   Anti-TPO antibodies present 10/20/2021   Rash 10/13/2021   Hashimoto thyroiditis, 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   Hashimoto's disease 03/23/2021   Exposure to severe acute respiratory syndrome coronavirus 2 (SARS-CoV-2) 12/19/2020   Migraine without aura and without status migrainosus, not intractable 08/19/2020   Myofascial pain 07/30/2020   Neck pain 07/30/2020   Upper back pain 07/30/2020   Cervical facet joint syndrome 07/30/2020   Hair loss 07/14/2020   Edema of lower extremity 07/11/2020   Adenomatous colon polyp 02/26/2020   Gastroesophageal reflux disease 02/15/2020   Diarrhea 02/15/2020   Sleep disorder 01/16/2020   Hyperlipidemia 07/11/2019   Right shoulder pain 04/04/2019   Pre-operative clearance 12/13/2018   Endometrial cancer (HCC) 04/05/2018   Patellofemoral pain syndrome of right knee 03/20/2018   Gastroenteritis 12/07/2017   Fatigue 11/16/2017   Morbid obesity (HCC) 08/11/2017   Stucco keratoses 08/11/2017   Essential hypertension 08/09/2017   History of endometrial cancer 08/09/2017   Lymphedema 08/09/2017   Menopause 08/09/2017   Malignant neoplastic disease (HCC) 04/07/2017   Cellulitis 09/18/2016   Benign hypertension 06/02/2016   Asthma 06/04/1969    PCP: Eather Golder MD   REFERRING PROVIDER: Eather Golder MD   REFERRING DIAG: s/p R hip replacement  THERAPY DIAG:  Stiffness of right hip, not elsewhere classified  Pain in right hip  Difficulty in walking, not elsewhere classified  Muscle weakness  (generalized)  Unsteadiness on feet  Rationale for Evaluation and Treatment: Rehabilitation  ONSET DATE: 11/14/23  SUBJECTIVE:   SUBJECTIVE STATEMENT:   No significant updates since last visit. Pt feeling generally good today.   PERTINENT HISTORY: s/p R hip arthropaslasty 11/14/23 with Dr. Clyda Dark posterior approach, blood coagulation disorder, PTSD, anxiety, cervical radiculopathy dypsnea, polyarthralgia, Raynaud disease, s/p closure of ileostomy, Hashimoto thyroid , endometrial cancer, menopause, endometrial cancer, lymphedema. Patient had home health PT prior to PT here.   PAIN:  Are you having pain?  2/10 Rt posterior deep hip and medial posterior gluteals   PRECAUTIONS: Other: recovering from hip sx  RED FLAGS: None   WEIGHT BEARING RESTRICTIONS: No  FALLS:  Has patient fallen in last 6 months? Yes. Number of falls 2  LIVING ENVIRONMENT: Lives with: lives with their spouse Lives in: House/apartment Stairs: No Has following equipment at home: Single point cane  OCCUPATION: retired,   PLOF: Independent  PATIENT GOALS: return to hiking, working out, gardening.   NEXT MD VISIT: March 2025   OBJECTIVE:  Note: Objective measures were completed  at Evaluation unless otherwise noted.  DIAGNOSTIC FINDINGS:  IMPRESSION: "Right hip replacement.  No visible complicating feature."  PATIENT SURVEYS:  LEFS 5/80  COGNITION: Overall cognitive status: Within functional limits for tasks assessed    MUSCLE LENGTH: Hamstrings: limited bilaterally with R>L   LOWER EXTREMITY ROM:  Passive ROM Right  04/02/24 Right eval Left eval  Hip flexion >90 degrees 48 flexion PROM supine   Hip extension 0 degrees    Hip abduction ~25 degrees 5 degrees AROM     (Blank rows = not tested)  LOWER EXTREMITY MMT:  MMT Right 04/02/24 Right eval Left eval  Hip flexion 4/5 2+ 5  Hip extension (supine 30 deg) 2-/5   5  Hip Hor ADD 5/5    Hip abduction 2+/5 2+*  5  Hip Hor ABDCT 4+/5     Hip adduction  2+ 5  Hip internal rotation 4+/5    Hip external rotation 3-/5     Knee flexion 5/5 3 5   Knee extension 4+/5 3 5   Ankle dorsiflexion 5/5 3 5   Ankle plantarflexion 5/5 3 5    (Blank rows = not tested)   FUNCTIONAL TESTS:  5 times sit to stand: 29 6 minute walk test:  10 meter walk test: 15 seconds with SPC   GAIT: Distance walked: 60 ft Assistive device utilized: Single point cane Level of assistance: CGA Comments: antalgic gait pattern with decreased stance time RLE. Slight vaulting pattern.  Stair negotiation: -step to pattern lead with LLE, one hand on cane one hand on rail ascending. Descending step to pattern down with RLE.                                                                                                                               TREATMENT DATE:  03/26/2024 -overground AMB 975ft c SPC, 7 minutes 25 seconds; has increased posterior gluteal/joint pain after 57ft, peaks at 4/10  -MMT testing (see above)  Improvements overall, however curious lack of change in hip external rotators, hip abductors  -Hooklying manual release to gluteus maximus along the sacral ligament, Rt lateral gluteus medius (excellent decrease in muscle tension)   AA/ROM Strengthening RLE: -hooklying bridge 1x10   -RLE hip ABDCT 1x12 in supine (mod assist provided) -RLE hip hooklying marching 1x10 (mod assist provided) -RLE hip manually resisted leg press 1x10 -RLE manually resisted hip extension (30-0 degrees) 1x10   -RLE hip ABDCT 1x12 in supine (mod assist provided) -RLE hip hooklying marching 1x10 (mod assist provided) -RLE hip manually resisted leg press 1x10 -RLE manually resisted hip extension (30-0 degrees) 1x10    PATIENT EDUCATION:  Education details: exercise technique Person educated: Patient Education method: Explanation, Demonstration, Tactile cues, and Verbal cues Education comprehension: verbalized understanding, returned demonstration, verbal  cues required, tactile cues required, and needs further education  HOME EXERCISE PROGRAM: Access Code: [facility] URL: [email or web address] Date: 02/08/2024; updated 03/21/2024 Prepared by: [employer]  Exercises -  Side Stepping with Counter Support  - 1 x daily - 7 x weekly - 2 sets - 10 reps - 5 hold - Standing Hip Extension  - 1 x daily - 7 x weekly - 2 sets - 10 reps - 5 hold - Standing March with Counter Support  - 1 x daily - 7 x weekly - 2 sets - 10 reps - 5 hold - Standing Hip Abduction with Counter Support  - 1 x daily - 7 x weekly - 2 sets - 10 reps - Seated Hamstring Stretch  - 1 x daily - 7 x weekly - 2 sets - 2 reps - 30 hold - Sit to Stand  - 1 x daily - 7 x weekly - 2 sets - 10 reps - 5 hold - Supine Heel Slide  - 1 x daily - 7 x weekly - 2 sets - 6 reps - Supine Gluteal Sets  - 1 x daily - 7 x weekly - 2 sets - 10 reps - Supine Bridge  - 1 x daily - 7 x weekly - 3 sets - 5 reps - 2-3sec hold - Supine Short Arc Quad  - 1 x daily - 7 x weekly - 3 sets - 15 reps - Small Range Straight Leg Raise  - 1 x daily - 7 x weekly - 3 sets - 5 reps - Hooklying Clamshell with Resistance  - 1 x daily - 7 x weekly - 3 sets - 15 reps - Prone Hip Extension with Bent Knee  - 1 x daily - 7 x weekly - 3 sets - 10 reps - Prone Hip Extension Sequence  - 1 x daily - 7 x weekly - 3 sets - 10 reps  ASSESSMENT: CLINICAL IMPRESSION:   Pt conitnues to be limited in AMB tolerance and sustained posturing by posterior gluteal and posterior hip joint discomfort and stiffness, these correlate well to updated MMT today showing continued remarkable weakness in Rt hip abductors, external rotators and straight leg extensors (adductor magnus), whereas she provides a good case for improvements in glute max function given the narrative of her hooklying bridge success, however it also remains notable limited and weak. Took time to provide manual release of these muscles, then isolated  straight plane exercises to any compensation use of adjacent muscles.   Pt will continue to benefit from skilled therapy to address remaining deficits in order to improve overall QoL and return to PLOF.      OBJECTIVE IMPAIRMENTS: Abnormal gait, decreased activity tolerance, decreased balance, decreased coordination, decreased endurance, decreased mobility, difficulty walking, decreased ROM, decreased strength, hypomobility, increased edema, impaired perceived functional ability, impaired flexibility, improper body mechanics, postural dysfunction, and pain.   ACTIVITY LIMITATIONS: carrying, lifting, bending, sitting, standing, squatting, sleeping, stairs, transfers, bed mobility, bathing, toileting, dressing, reach over head, hygiene/grooming, locomotion level, and caring for others  PARTICIPATION LIMITATIONS: meal prep, cleaning, laundry, interpersonal relationship, driving, shopping, community activity, and yard work  PERSONAL FACTORS: Age, Past/current experiences, Time since onset of injury/illness/exacerbation, and 3+ comorbidities: blood coagulation disorder, PTSD, anxiety, cervical radiculopathy dypsnea, polyarthralgia, Raynaud disease, s/p closure of ileostomy, Hashimoto thyroid , endometrial cancer, menopause, endometrial cancer, lymphedema  are also affecting patient's functional outcome.   REHAB POTENTIAL: Good  CLINICAL DECISION MAKING: Evolving/moderate complexity  EVALUATION COMPLEXITY: Moderate   GOALS: Goals reviewed with patient? Yes  SHORT TERM GOALS: Target date: 01/19/2024  Patient will be independent in home exercise program to improve strength/mobility for better functional independence with ADLs. Baseline:1/30:  HEP given 3/12: pt reports completing 3-4 days a week depending on soreness.  Goal status:  MET  LONG TERM GOALS: Target date: 06/04/2024  Patient (> 66 years old) will complete five times sit to stand test in < 10 seconds indicating an increased LE strength  and improved balance. Baseline: 1/30: 29 seconds 3/12: 19.41 03/12/2024= 17.44 seconds- without UE support Goal status: PROGRESSING  2.  Patient will increase six minute walk test distance to >1000 for progression to community ambulator and improve gait ability Baseline: 430 ft in 5 min and required seated rest to end test due to fatigue, uses SPC 843ft with SPC and no standing or seated rest break; 03/12/2024= 950 feet with SPC Goal status: in progress   3.  Patient will increase 10 meter walk test to >1.55m/s as to improve gait speed for better community ambulation and to reduce fall risk. Baseline: 1/30: 15 seconds with SPC  Without SPC: 10.63 and 10.38 speed: 0.95 m/s; with SPC 10.98 and 11.18; 03/12/2024= 1.0 m/s  Goal status: PROGRESSING  4.  Patient will increase lower extremity functional scale to >60/80 to demonstrate improved functional mobility and increased tolerance with ADLs.  Baseline: 1/30: 5/80 3/12: 20/80; 03/12/2024= 29/80 Goal status: Progressing  PLAN:  PT FREQUENCY: 1-2x/week PT DURATION: 12 weeks PLANNED INTERVENTIONS: 97164- PT Re-evaluation, 97110-Therapeutic exercises, 97530- Therapeutic activity, 97112- Neuromuscular re-education, 97535- Self Care, 14782- Manual therapy, 97116- Gait training, 97760- Splinting, 97014- Electrical stimulation (unattended), [id number]- Electrical stimulation (manual), [id number]- Ultrasound, 95621- Traction (mechanical), Patient/Family education, Balance training, Stair training, Taping, Joint mobilization, Joint manipulation, Spinal manipulation, Spinal mobilization, Manual lymph drainage, Scar mobilization, Compression bandaging, Vestibular training, Visual/preceptual remediation/compensation, DME instructions, Cryotherapy, and Moist heat  PLAN FOR NEXT SESSION:   Continue with focal strength interventions, consider updating HEP to more simplified exercises.   11:21 AM, 04/02/24 Dawn Eth, PT, DPT Physical Therapist - [facility]  [phone] ([facility])   04/02/24, 10:27 AM

## 2024-04-03 ENCOUNTER — Ambulatory Visit (INDEPENDENT_AMBULATORY_CARE_PROVIDER_SITE_OTHER): Admitting: Licensed Clinical Social Worker

## 2024-04-03 DIAGNOSIS — F4310 Post-traumatic stress disorder, unspecified: Secondary | ICD-10-CM | POA: Diagnosis not present

## 2024-04-03 DIAGNOSIS — F411 Generalized anxiety disorder: Secondary | ICD-10-CM

## 2024-04-03 DIAGNOSIS — F3341 Major depressive disorder, recurrent, in partial remission: Secondary | ICD-10-CM

## 2024-04-03 NOTE — Progress Notes (Signed)
 THERAPIST PROGRESS NOTE  Session Time: 9-10am  Participation Level: Active  Behavioral Response: CasualAlertEuthymic  Type of Therapy: Individual Therapy  Treatment Goals addressed:  Template: Depression (OP)                                                    Goal: LTG: Reduce frequency, intensity, and duration of depression symptoms so that daily functioning is improved                                              Goal: STG: Colie will identify cognitive patterns and beliefs that support depression                                          Goal: LTG: Pt identifies the goal to Accept chronic pain and learning coping skills                                                          Template: PTSD (OP)                                               Goal: LTG: Elimination of maladaptive behaviors and thinking patterns which interfere with resolution of trauma as evidenced by self report                                              Goal: LTG: Develop and implement effective coping skills to carry out normal responsibilities and participate constructively in relationships as evidenced by self report                                          Goal: LTG: Recall traumatic events without becoming overwhelmed with negative emotions                                         Goal: LTG: Pt reports "think about these things in the past without having such an impact on my life."                           ProgressTowards Goals: Progressing  Interventions: CBT, Assertiveness Training, Supportive, and Reframing  Summary:  Elizabeth Martin is a 63 y.o. female who presents with symptoms of depression and trauma. Patient identifies symptoms to include reexperiencing, uncontrollable worry, and hypervigilance. Pt was oriented times 5. Pt was cooperative and engaged. Pt denies SI/HI/AVH.   The patient reflected on changes in  her medication for  her physical condition, which have helped with mood regulation, improved impulsivity, and addressed the impacts on her relationships. She identified that in the past week, she has experienced what she labels as social anxiety, evidenced by judgment about what she said in a group and over-processing her social interactions. The patient recognized that she experienced negative thoughts such as, "I'm inferior" and "I'm not good enough." She reports that her recent shift in mood was a result of her behaviors while taking Lyrica .   The patient also discussed previous negative interactions with her friend group. The clinician challenged her to process her feelings about interactions with women. The patient reflected on her tendency to try to "replace her relationships with women," considering her relationships with her mother and grandmother growing up. The clinician worked with her to address her expectations of others and her pattern of trying to change them.   The patient reflected on an upcoming lunch with an acquaintance with whom she used to be closer. They addressed strategies for the patient to manage her anxiety and expectations in this situation.  Suicidal/Homicidal: Nowithout intent/plan  Therapist Response: Clinician used active and supportive reflection to create a safe environment for patient to process recent life stressors. Clinician assessed for current symptoms, stressors, safety since last session. Patient and clinician reflected on healthy relationships and controllable factors for the patient within relationships.   Plan: Return again in 2 weeks.  Diagnosis: Recurrent major depressive disorder, in partial remission (HCC)  GAD (generalized anxiety disorder)  PTSD (post-traumatic stress disorder)   Collaboration of Care: AEB psychiatrist can access notes and cln. Will review psychiatrists' notes. Check in with the patient and will see LCSW per availability. Patient agreed with  treatment recommendations.   Patient/Guardian was advised Release of Information must be obtained prior to any record release in order to collaborate their care with an outside provider. Patient/Guardian was advised if they have not already done so to contact the registration department to sign all necessary forms in order for us  to release information regarding their care.   Consent: Patient/Guardian gives verbal consent for treatment and assignment of benefits for services provided during this visit. Patient/Guardian expressed understanding and agreed to proceed.   [name], LCSW 04/03/2024

## 2024-04-04 ENCOUNTER — Encounter: Payer: Self-pay | Admitting: Psychiatry

## 2024-04-04 ENCOUNTER — Ambulatory Visit: Admitting: Psychiatry

## 2024-04-04 ENCOUNTER — Ambulatory Visit

## 2024-04-04 ENCOUNTER — Ambulatory Visit: Admitting: Occupational Therapy

## 2024-04-04 VITALS — BP 140/84 | HR 90 | Temp 98.7°F | Ht 66.0 in | Wt 174.4 lb

## 2024-04-04 DIAGNOSIS — F411 Generalized anxiety disorder: Secondary | ICD-10-CM

## 2024-04-04 DIAGNOSIS — M6281 Muscle weakness (generalized): Secondary | ICD-10-CM

## 2024-04-04 DIAGNOSIS — R262 Difficulty in walking, not elsewhere classified: Secondary | ICD-10-CM

## 2024-04-04 DIAGNOSIS — F4310 Post-traumatic stress disorder, unspecified: Secondary | ICD-10-CM | POA: Diagnosis not present

## 2024-04-04 DIAGNOSIS — F3341 Major depressive disorder, recurrent, in partial remission: Secondary | ICD-10-CM

## 2024-04-04 DIAGNOSIS — M25551 Pain in right hip: Secondary | ICD-10-CM

## 2024-04-04 DIAGNOSIS — I890 Lymphedema, not elsewhere classified: Secondary | ICD-10-CM | POA: Diagnosis not present

## 2024-04-04 DIAGNOSIS — R2681 Unsteadiness on feet: Secondary | ICD-10-CM

## 2024-04-04 DIAGNOSIS — M25651 Stiffness of right hip, not elsewhere classified: Secondary | ICD-10-CM

## 2024-04-04 MED ORDER — VENLAFAXINE HCL ER 37.5 MG PO CP24: 37.5000 mg | ORAL_CAPSULE | Freq: Every day | ORAL | 1 refills | Status: DC

## 2024-04-04 MED ORDER — DULOXETINE HCL 30 MG PO CPEP
30.0000 mg | ORAL_CAPSULE | ORAL | Status: DC
Start: 2024-04-04 — End: 2024-06-19

## 2024-04-04 NOTE — Therapy (Signed)
 OUTPATIENT OCCUPATIONAL THERAPY TREATMENT NOTE  LOWER EXTREMITY LYMPHEDEMA  Patient Name: Elizabeth Martin MRN: [id number] DOB:04/19/1961, 63 y.o., female Today's Date: 04/04/2024  REPORTING PERIOD:   END OF SESSION:  Lymphedema Episode 2   OT End of Session - 04/04/24 1106     Visit Number 27    Number of Visits 36    Date for OT Re-Evaluation 04/16/24    OT Start Time 1102    OT Stop Time 1202    OT Time Calculation (min) 60 min    Activity Tolerance Patient tolerated treatment well;No increased pain    Behavior During Therapy WFL for tasks assessed/performed              Past Medical History:  Diagnosis Date   Anxiety    Asthma    Autoimmune disease (HCC)    Cancer (HCC)    Endometrial lymph node removal (30)   Corneal abrasion, right 05/24/2022   Depression    GERD (gastroesophageal reflux disease)    H/O blood clots    upper rt groin and behind both knees   History of hiatal hernia    Hypertension    Lymphedema    right leg   Morphea scleroderma    PONV (postoperative nausea and vomiting)    states gets violently ill   Past Surgical History:  Procedure Laterality Date   BASAL CELL CARCINOMA EXCISION Left    CHOLECYSTECTOMY  2008   FLEXIBLE SIGMOIDOSCOPY N/A 05/19/2022   Procedure: FLEXIBLE SIGMOIDOSCOPY;  Surgeon: Toledo, Alphonsus Jeans, MD;  Location: [facility];  Service: Gastroenterology;  Laterality: N/A;   HERNIA REPAIR  2004   ILEOSTOMY CLOSURE N/A 08/24/2022   Procedure: ILEOSTOMY TAKEDOWN, open loop with Apolonio Bay, PA-C to assist;  Surgeon: Emmalene Hare, MD;  Location: [facility];  Service: General;  Laterality: N/A;   IVC FILTER INSERTION N/A 11/09/2023   Procedure: IVC FILTER INSERTION;  Surgeon: Celso College, MD;  Location: [facility];  Service: Cardiovascular;  Laterality: N/A;   IVC FILTER REMOVAL N/A 01/02/2024   Procedure: IVC FILTER REMOVAL;  Surgeon: Celso College, MD;  Location: [facility];  Service:  Cardiovascular;  Laterality: N/A;   PLANTAR FASCIECTOMY     ROBOTIC ASSISTED TOTAL HYSTERECTOMY WITH BILATERAL SALPINGO OOPHERECTOMY  02/14/2012   ROTATOR CUFF REPAIR Left 03/24/2022   TONSILLECTOMY     TOTAL HIP ARTHROPLASTY Right 11/14/2023   Procedure: TOTAL HIP ARTHROPLASTY - posterior;  Surgeon: Venus Ginsberg, MD;  Location: [facility];  Service: Orthopedics;  Laterality: Right;   XI ROBOTIC ASSISTED LOWER ANTERIOR RESECTION N/A 05/24/2022   Procedure: XI ROBOTIC ASSISTED LOWER ANTERIOR RESECTION;  Surgeon: Emmalene Hare, MD;  Location: [facility];  Service: General;  Laterality: N/A;   Patient Active Problem List   Diagnosis Date Noted   Constipation 01/10/2024   Nausea & vomiting 01/10/2024   S/P total right hip arthroplasty 11/14/2023   Blood coagulation disorder (HCC) 11/10/2023   PTSD (post-traumatic stress disorder) 11/10/2023   Severe episode of recurrent major depressive disorder, without psychotic features (HCC) 11/10/2023   GAD (generalized anxiety disorder) 11/10/2023   High risk medication use 11/10/2023   Anxiety and depression 03/22/2023   Cervical radiculopathy 03/16/2023   Colon cancer screening 03/15/2023   Chronic idiopathic constipation 03/15/2023   Dyspnea 03/07/2023   [facility] spotted fever 03/07/2023   Strain of gastrocnemius tendon 03/07/2023   Lower abdominal pain 03/07/2023   Pain in pelvis 03/07/2023   Small bowel  obstruction (HCC) 02/11/2023   Raynaud disease 02/11/2023   Polyarthralgia 02/11/2023   Edema 01/03/2023   Diverticular disease 12/10/2022   Hot flashes 12/10/2022   Pain of right lower extremity 12/10/2022   Paresthesia of lower extremity 12/10/2022   Primary insomnia 12/10/2022   Thyroid  nodule 12/10/2022   Multiple joint pain 12/10/2022   Basal cell carcinoma of skin 12/10/2022   Acute bilateral low back pain without sciatica 11/10/2022   Chronic lower back pain 09/05/2022   S/P closure of ileostomy 08/24/2022   Ileostomy in  place [facility])    Morphea 07/13/2022   Colonic stricture (HCC) 05/19/2022   Large bowel obstruction (HCC) 05/19/2022   Hypokalemia 05/18/2022   Abdominal pain 05/18/2022   Osteoarthritis of left knee 02/24/2022   Mixed connective tissue disease (HCC) 02/17/2022   Adhesive capsulitis of left shoulder 01/27/2022   Glenohumeral arthritis, left 01/27/2022   Microscopic hematuria 01/12/2022   Otorrhagia of right ear 12/10/2021   Elevated testosterone level in female 11/04/2021   Anti-TPO antibodies present 10/20/2021   Rash 10/13/2021   Hashimoto thyroiditis, 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   Hashimoto's disease 03/23/2021   Exposure to severe acute respiratory syndrome coronavirus 2 (SARS-CoV-2) 12/19/2020   Migraine without aura and without status migrainosus, not intractable 08/19/2020   Myofascial pain 07/30/2020   Neck pain 07/30/2020   Upper back pain 07/30/2020   Cervical facet joint syndrome 07/30/2020   Hair loss 07/14/2020   Edema of lower extremity 07/11/2020   Adenomatous colon polyp 02/26/2020   Gastroesophageal reflux disease 02/15/2020   Diarrhea 02/15/2020   Sleep disorder 01/16/2020   Hyperlipidemia 07/11/2019   Right shoulder pain 04/04/2019   Pre-operative clearance 12/13/2018   Endometrial cancer (HCC) 04/05/2018   Patellofemoral pain syndrome of right knee 03/20/2018   Gastroenteritis 12/07/2017   Fatigue 11/16/2017   Morbid obesity (HCC) 08/11/2017   Stucco keratoses 08/11/2017   Essential hypertension 08/09/2017   History of endometrial cancer 08/09/2017   Lymphedema 08/09/2017   Menopause 08/09/2017   Malignant neoplastic disease (HCC) 04/07/2017   Cellulitis 09/18/2016   Benign hypertension 06/02/2016   Asthma 06/04/1969    PCP: Eather Golder, MD  REFERRING PROVIDER: Eather Golder, MD  REFERRING DIAG: I89.0   THERAPY DIAG:  Lymphedema, not elsewhere classified  Rationale for Evaluation and Treatment: Rehabilitation  ONSET DATE: 2013   (Cancer-related, endometrial 2013)  SUBJECTIVE:                                                                                                                                                                                           SUBJECTIVE STATEMENT:Patient returns to OT for lymphedema  care to RLE/RLQ. Pt states she is sore and winces when changing position during session. Pt does not rate pain numerically. Pt states her husband retired from his job today and can wrap her leg at home after session.    PERTINENT HISTORY: Asthma, Autoimmune Disease (Connective Tissue Disease), Endometrial Ca w/ LND ( 30 bilateral pelvic and periaortic LN), adjuvant chemotherapy and XRT,   H/O blood clots, RLE/RLQ lymphedema, Robotic assisted total hysterectomy w/ bilateral oophorectomy L Rotator Cuff repair, S/P ileostomy 2/2 colon stricture 05/2022; s/p R hip arthroplasty 11/11/23.   PAIN:  Are you having pain? YES. See subjective; not rated /10 Pain location: RLE, R hip, cervical spine Pain description: sore, aching, heavy, full, tight,  Aggravating factors: standing, walking, extended dependent sitting Relieving factors: elevation, movement  PRECAUTIONS: Fall and Other: LYMPHEDEMA  WEIGHT BEARING RESTRICTIONS: Yes 5# lifting restriction- shoulder  FALLS:  Has patient fallen in last 6 months? No  LIVING ENVIRONMENT: Lives with: lives with their spouse Lives in: House/apartment Stairs: No;  Has following equipment at home: Single point cane, Environmental consultant - 2 wheeled, Environmental consultant - 4 wheeled, and Wheelchair (manual)  OCCUPATION: retired Control and instrumentation engineer: gardening, hiking, biking, dancing- unable to participate in any of these due to pain and swelling  HAND DOMINANCE: right   PRIOR LEVEL OF FUNCTION: Independent with basic ADLs, Independent with household mobility without device, Independent with community mobility without device, Requires assistive device for independence, Needs assistance with  homemaking, Needs assistance with transfers, and Leisure: decreased  social participation for leisure pursuits due to impaired mobility and pain  PATIENT GOALS:  Be able to move freely without pain Reduce limb volume to be able to lift limb for basic daily ADLs and functional ambulation Reduce limb volume to increase ability to perform lower body dressing and bathing and grooming Reduce limb to increase body image  OBJECTIVE: Note: Objective measures were completed at Evaluation unless otherwise noted.  COGNITION:  Overall cognitive status: Within functional limits for tasks assessed   OBSERVATIONS / OTHER ASSESSMENTS:   POSTURE: WFL  LE ROM: WFL, but Limited mildly at R knee and ankle due to girth, skin approximation, and joint pain  LE MMT: WFL  Mild, Stage  II, Bilateral Lower Extremity Lymphedema 2/2 CVI and Obesity  Skin  Description Hyper-Keratosis Peau d' Orange Shiny Tight Fibrotic/ Indurated Fatty Doughy Spongy/ boggy   x x x x R>L Severe morphea at  R groin   x   Skin dry Flaky WNL Macerated   mildly sclerotic     Color Redness Varicosities Blanching Hemosiderin Stain Mottled   x x x   x   Odor Malodorous Yeast Fungal infection  WNL      x   Temperature Warm Cool wnl    x     Pitting Edema   1+ 2+ 3+ 4+ Non-pitting         x   Girth Symmetrical Asymmetrical                   Distribution    R>L RLE toes to groin    Stemmer Sign Positive Negative   +    Lymphorrhea History Of:  Present Absent     x    Wounds History Of Present Absent Venous Arterial Pressure Sheer     x        Signs of Infection Redness Warmth Erythema Acute Swelling Drainage Borders  Sensation Light Touch Deep pressure Hypersensitivity   In tact Impaired In tact Impaired Absent Impaired   x  x  x     Nails WNL   Fungus nail dystrophy   x     Hair Growth Symmetrical Asymmetrical    R>L   Skin Creases Base of toes  Ankles   Base of Fingers  knees       Abdominal pannus Thigh Lobules  Face/neck   x x  x        BLE COMPARATIVE LIMB VOLUMETRICS 10/10/23  LANDMARK RIGHT  10/10/23  R LEG (A-D) 5466.5 ml  R THIGH (E-G) 9186.6 ml  R FULL LIMB (A-G) 14653.1 ml  Limb Volume differential (LVD)  LVD for LEG = 44.1%, R>L LVD for THIGH= 33.98%, R>L LVD for FULL lower extremity 37.8%, R>L  Volume change since last 08/11/22 R LEG is INCREASED in volume by 59.6%. L THIGH is INCREASED by 38 %, and RLE full limb is INCREASED by 45.38%.  Volume change overall V  (Blank rows = not tested)  LANDMARK LEFT  10/12/23  L LEG (A-D) 3053.6 ml  L THIGH (E-G) 6064.8 ml  L  FULL LIMB (A-G) 9118.4 ml  Limb Volume differential (LVD)  %  Volume change since initial %  Volume change overall %  (Blank rows = not tested)   10 th VISIT PROGRESS NOTE RLE COMPARATIVE LIMB VOLUMETRICS 01/04/24: Deferred until next visit by Pt request.  LANDMARK RIGHT    R LEG (A-D) TBA  R THIGH (E-G) TBA  R FULL LIMB (A-G) TBA  Limb Volume differential (LVD)    Volume change since last 08/11/22 TBA  Volume change overall TBA  (Blank rows = not tested)  20 th VISIT PROGRESS NOTE RLE COMPARATIVE LIMB VOLUMETRICS TBA next session  LANDMARK RIGHT    R LEG (A-D) 6005.7 ml  R THIGH (E-G) 9400.3 ml  R FULL LIMB (A-G) 15406.02 ml  Limb Volume differential (LVD)    Volume change since last measured on 10/10/23 R LEG is INCREASED in volume by 9.9%. L THIGH is INCREASED by 2.32 %, and RLE full limb is INCREASED by 5.13% since last measured on 10/10/23.  Volume change since commencing OT for CDT %   ENDOMETRIAL CA-related LYMPHEDEMA Hx:  SURGERY TYPE/DATE: 2013 NUMBER OF LYMPH NODES REMOVED: 30 by report- bilateral pelvic and periaortic CHEMOTHERAPY: yes RADIATION:yes INFECTIONS: no hx cellulitis GAIT: Distance walked: >500 ft Assistive device utilized: single point cane Level of assistance: Modified independence- extra time Comments: R limp  LYMPHEDEMA LIFE IMPACT  SCALE (LLIS): Intake 10/12/23 75% (The extent to which lymphedema related problems impacted your life over the past week)  FOTO (functional outcome measure):10/10/23 INTAKE: 36% No Out take score. FOTO assessment discontinued by clinic.  PATIENT EDUCATION:  Education details: Continued Pt/ CG edu for lymphedema self care home program throughout session. Topics include outcome of comparative limb volumetrics- starting limb volume differentials (LVDs), technology and gradient techniques used for short stretch, multilayer compression wrapping, simple self-MLD, therapeutic lymphatic pumping exercises, skin/nail care, LE precautions,. compression garment recommendations and specifications, wear and care schedule and compression garment donning / doffing w assistive devices. Discussed progress towards all OT goals since commencing CDT. All questions answered to the Pt's satisfaction. Good return. Person educated: Patient Education method: Explanation, Demonstration, and Handouts Education comprehension: verbalized understanding, returned demonstration, and needs further education  LE SELF-CARE HOME  PROGRAM: Simple self-MLD/daily to affected quadrant and body part At least 2 x  daily BLE Lymphatic Pumping There ex 1 set of 10 reps each, in order, bilaterally Daily skin care to affected body part to limit infection risk and increase skin excursion Compression Bandaging Intensive stage compression: multilayer short stretch wraps with gradient techniques. One limb at a time. Length patient dependent. Self-management Phase: Appropriate daytime compression garment and hours-of-sleep device  Compression garments: Custom-made gradient compression garments and hours-of-sleep devices are medically necessary because they are uniquely sized and shaped to fit the exact dimensions of the affected extremities and to provide accurate and consistent gradient compression and containment, essential  for optimally managing  chronic, progressive lymphedema. The convoluted HOS devices are medically necessary to facilitate increased lymphatic circulation and limit fibrosis formation when sleeping. Multiple custom compression garments are needed for optimal hygiene to limit infection risk. Custom compression garments should be replaced q 3-6 months When worn consistently for optimal lipo-lymphedema self-management over time.  Pt will benefit from [id number]- Circaid reduction kit whole leg to increase independence with lymphedema self care, to  reduce limb volume  for body symmetry and balance, and to limit infection risk and progression.  ASSESSMENT:  CLINICAL IMPRESSION:  Progress towards limb volume reduction remain slow. Lymphedema management  is very challenging as Pt experiences frequent flare ups of systemic inflammation 2/2 mixed connective tissue disease. R thigh has softened minimally, but leg and ankle remain very hard and tight with dense, high protein, lymphatic congestion. Pt tolerated MLD to RLE/RLQ without increased pain. We also used deep fibrosis techniques to distal leg and medial malleolus with palpable softening in tissue density after session.Applied multilayer compression wraps as established using gradient techniques.  Cont as per POC.  10/10/23 Lymphedema Episode 2, Initial OT Evaluation : Becka Hipke is a 74 y a female presenting with chronic, progressive, moderate, Stage II, RLE/RLQ cancer -related lymphedema with onset 11 years ago after Rx for endometrial cancer. Pt is well known to this therapist as she has successfully undergone Complete Decongestive Therapy (CDT)  this clinic bin the past. Pt reports recent exacerbation of RLE swelling worsened as inflammation in L hip has worsened over time.  Pt has a hip replacement scheduled in late December here it [facility]. RE limb volumetrics today reveal that R LEG volume is dramatically increased by 59.65 since last visit. R LEG VOLUME is INCREASED in volume by  59.6%. L THIGH is INCREASED by 38 %, and RLE full limb is INCREASED by 45.38%. Pt presents with LE-related skin changes. Prolonged inflammation in the R hip joint has likely overloaded  already RLE/RLQ lymphatics.  RLE/RLQ lymphedema limits Pt's ability to perform basic and instrumental ADLs, including functional ambulation, mobility and transfers, grooming , lower body bathing and dressing, skin inspection and skin care. Pt has difficulty  reaching her lower legs and feet to apply compression wraps and don/doff        compression stockings. LE limits ability to P[perform instrumental ADLs, including driving, yard work and home management activities. Lymphedema limits her ability to participate in leisure pursuits and productive activities, and it negatively impacts body image, life roles and quality of life.   Pt will benefit from an Intensive and follow along course of lymphedema. CDT will consist of Manual Lymphatic gainage   (MLD), skin care, therapeutic exercise and compression, wraps initially then garments. Pt will need assistance throughout CDT   for applying compression wraps due to limited hip AROM and decreased skin flexibility. Without skilled Occupational Therapy for lymphedema care, lymphedema will progress and further functional  decline is expected.   In prep for upcoming hip replacement OT will educate Pt re assistive devices, including tub transfer bench and elevated toilet seat, in keeping with hip precautions.  OBJECTIVE IMPAIRMENTS: Abnormal gait, decreased activity tolerance, decreased balance, decreased knowledge of use of DME, decreased mobility, difficulty walking, decreased ROM, decreased strength, increased edema, decreased skin flexibility, increased fascial restrictions, impaired sensation, pain, and chronic, progressive LLE/LLQ lymphatic swelling and associated pain.   ADL LIMITATIONS: carrying, lifting, bending, sitting, standing, squatting, sleeping, stairs, transfers, bed  mobility, bathing, dressing, hygiene/grooming, and productive activities, leisure pursuits, social participation, body image  driving, shopping, housework, yard work, cooking, meal prep  PERSONAL FACTORS: Past/current experiences and 3+ comorbidities: Morphea Scleroderma, Mixed connective tissue disease, and osteoarthritis  are also affecting patient's functional outcome.   REHAB POTENTIAL: Good  EVALUATION COMPLEXITY: Moderate  GOALS: Goals reviewed with patient? Yes  SHORT TERM GOALS: Target date: 4th OT Rx visit   Pt will demonstrate understanding of lymphedema precautions and prevention strategies with modified independence using a printed reference to identify at least 5 precautions and discussing how s/he may implement them into daily life to reduce risk of progression with extra time.  Baseline:Max A Goal status: GOAL MET  2.  Pt will be able to apply multilayer, knee length, gradient, compression wraps to one leg at a time with modified assistance (extra time and assistive device/s) to decrease limb volume, to limit infection risk, and to limit lymphedema progression.  Baseline: Dependent Goal status: GOAL MET  LONG TERM GOALS: Target date: 01/09/24 (12 weeks)  Given this patient's Intake score 36% on the functional outcomes FOTO tool, patient will experience an increase in function of 3 points to improve basic and instrumental ADLs performance, including lymphedema self-care.  Baseline: Max A Goal status: DEFERRED as [employer] has been discontinued  2.  Given this patient's Intake score of  75% on the Lymphedema Life Impact Scale (LLIS), patient will experience a reduction of at least 5 points in her perceived level of functional impairment resulting from lymphedema to improve functional performance and quality of life (QOL). Baseline: % Goal status: PROGRESSING  3.  Pt will achieve at least a 10% volume reduction in full, RLE limb volume to return limb to typical size and shape, to  limit infection risk and LE progression, to decrease pain, to improve function. Baseline: Dependent Goal status:PROGRESSING.  Volumetrics deferred until next week to focus on manual therapy and relief of pain/discomfort. To date by visual assessment I would estimate no more than a 10% volume reduction. DENSITY IN THIGH HAS REDUCED only SLIGHTLY. Morphea at R groin continues to present a significant obstacle to limb volume decongestion and fibrosis reduction.  Progress is slow. Lymphedema management  with combination of progressive, secondary lymphedema with connective tissue disease is very stubborn and challenging.   4.  Pt will obtain proper compression garments/devices and achieve modified independence (extra time + assistive devices) with donning/doffing to optimize limb volume reductions and limit LE  progression over time. Baseline:  Goal status: 01/04/24: PARTIALLY MET; In an effort to reduce burden of care on her spouse, Pt obtained adjustable, Velcro style R full leg, Circaid leg reduction kit for use s/p THA, but this proved not effective for controlling swelling, and were not easier to don and doff more independently. We've resumed wrapping until she is able to fit back into custom compression garments. 02/14/24: Ongoing  5.  During Intensive phase CDT , with modified independence, Pt will achieve  at least 85% compliance with all lymphedema self-care home program components, including daily skin care, compression wraps and /or garments, simple self MLD and lymphatic pumping therex to habituate LE self care protocol  into ADLs for optimal LE self-management over time. Baseline: Dependent Goal status: 02/14/24: GOAL MET and EXCEEDED PLAN:  OT FREQUENCY: 2x/week  OT DURATION: 12 weeks and PRN  PLANNED INTERVENTIONS: Complete Decongestive Therapy (Intensive and supported Self-Management Phases), 97110-Therapeutic exercises, 97530- Therapeutic activity, 97535- Self Care, 16109- Manual therapy,  Patient/Family education, Taping, Manual lymph drainage, Scar mobilization, Compression bandaging, DME instructions, and skin care to reduce infection risk throughout manual therapy. Fit with replacement compression garments ASAP  PLAN FOR NEXT SESSION:  Cont RLE/RLQ MLD Cont multilayer compression wrapping Manufacturer's rep here to assist with custom garment measuring. Consider multi-pieces- bike shorts over thigh high Capri and knee high  Arnold Bicker, MS, OTR/L, CLT-LANA 04/04/24 12:46 PM

## 2024-04-04 NOTE — Therapy (Signed)
 OUTPATIENT PHYSICAL THERAPY TREATMENT    Patient Name: Elizabeth Martin MRN: [id number] DOB:01/01/1961, 63 y.o., female Today's Date: 04/04/2024  END OF SESSION:  PT End of Session - 04/04/24 1018     Visit Number 17    Number of Visits 35    Date for PT Re-Evaluation 06/04/24    Authorization Type [employer]    Progress Note Due on Visit 20    PT Start Time 1017    PT Stop Time 1057    PT Time Calculation (min) 40 min    Activity Tolerance Patient tolerated treatment well    Behavior During Therapy WFL for tasks assessed/performed                  Past Medical History:  Diagnosis Date   Anxiety    Asthma    Autoimmune disease (HCC)    Cancer (HCC)    Endometrial lymph node removal (30)   Corneal abrasion, right 05/24/2022   Depression    GERD (gastroesophageal reflux disease)    H/O blood clots    upper rt groin and behind both knees   History of hiatal hernia    Hypertension    Lymphedema    right leg   Morphea scleroderma    PONV (postoperative nausea and vomiting)    states gets violently ill   Past Surgical History:  Procedure Laterality Date   BASAL CELL CARCINOMA EXCISION Left    CHOLECYSTECTOMY  2008   FLEXIBLE SIGMOIDOSCOPY N/A 05/19/2022   Procedure: FLEXIBLE SIGMOIDOSCOPY;  Surgeon: Toledo, Alphonsus Jeans, MD;  Location: [facility];  Service: Gastroenterology;  Laterality: N/A;   HERNIA REPAIR  2004   ILEOSTOMY CLOSURE N/A 08/24/2022   Procedure: ILEOSTOMY TAKEDOWN, open loop with Apolonio Bay, PA-C to assist;  Surgeon: Emmalene Hare, MD;  Location: [facility];  Service: General;  Laterality: N/A;   IVC FILTER INSERTION N/A 11/09/2023   Procedure: IVC FILTER INSERTION;  Surgeon: Celso College, MD;  Location: [facility];  Service: Cardiovascular;  Laterality: N/A;   IVC FILTER REMOVAL N/A 01/02/2024   Procedure: IVC FILTER REMOVAL;  Surgeon: Celso College, MD;  Location: [facility];  Service: Cardiovascular;  Laterality: N/A;    PLANTAR FASCIECTOMY     ROBOTIC ASSISTED TOTAL HYSTERECTOMY WITH BILATERAL SALPINGO OOPHERECTOMY  02/14/2012   ROTATOR CUFF REPAIR Left 03/24/2022   TONSILLECTOMY     TOTAL HIP ARTHROPLASTY Right 11/14/2023   Procedure: TOTAL HIP ARTHROPLASTY - posterior;  Surgeon: Venus Ginsberg, MD;  Location: [facility];  Service: Orthopedics;  Laterality: Right;   XI ROBOTIC ASSISTED LOWER ANTERIOR RESECTION N/A 05/24/2022   Procedure: XI ROBOTIC ASSISTED LOWER ANTERIOR RESECTION;  Surgeon: Emmalene Hare, MD;  Location: [facility];  Service: General;  Laterality: N/A;   Patient Active Problem List   Diagnosis Date Noted   Constipation 01/10/2024   Nausea & vomiting 01/10/2024   S/P total right hip arthroplasty 11/14/2023   Blood coagulation disorder (HCC) 11/10/2023   PTSD (post-traumatic stress disorder) 11/10/2023   Severe episode of recurrent major depressive disorder, without psychotic features (HCC) 11/10/2023   GAD (generalized anxiety disorder) 11/10/2023   High risk medication use 11/10/2023   Anxiety and depression 03/22/2023   Cervical radiculopathy 03/16/2023   Colon cancer screening 03/15/2023   Chronic idiopathic constipation 03/15/2023   Dyspnea 03/07/2023   [facility] spotted fever 03/07/2023   Strain of gastrocnemius tendon 03/07/2023   Lower abdominal pain 03/07/2023   Pain in pelvis  03/07/2023   Small bowel obstruction (HCC) 02/11/2023   Raynaud disease 02/11/2023   Polyarthralgia 02/11/2023   Edema 01/03/2023   Diverticular disease 12/10/2022   Hot flashes 12/10/2022   Pain of right lower extremity 12/10/2022   Paresthesia of lower extremity 12/10/2022   Primary insomnia 12/10/2022   Thyroid  nodule 12/10/2022   Multiple joint pain 12/10/2022   Basal cell carcinoma of skin 12/10/2022   Acute bilateral low back pain without sciatica 11/10/2022   Chronic lower back pain 09/05/2022   S/P closure of ileostomy 08/24/2022   Ileostomy in place [facility])    Morphea 07/13/2022    Colonic stricture (HCC) 05/19/2022   Large bowel obstruction (HCC) 05/19/2022   Hypokalemia 05/18/2022   Abdominal pain 05/18/2022   Osteoarthritis of left knee 02/24/2022   Mixed connective tissue disease (HCC) 02/17/2022   Adhesive capsulitis of left shoulder 01/27/2022   Glenohumeral arthritis, left 01/27/2022   Microscopic hematuria 01/12/2022   Otorrhagia of right ear 12/10/2021   Elevated testosterone level in female 11/04/2021   Anti-TPO antibodies present 10/20/2021   Rash 10/13/2021   Hashimoto thyroiditis, 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   Hashimoto's disease 03/23/2021   Exposure to severe acute respiratory syndrome coronavirus 2 (SARS-CoV-2) 12/19/2020   Migraine without aura and without status migrainosus, not intractable 08/19/2020   Myofascial pain 07/30/2020   Neck pain 07/30/2020   Upper back pain 07/30/2020   Cervical facet joint syndrome 07/30/2020   Hair loss 07/14/2020   Edema of lower extremity 07/11/2020   Adenomatous colon polyp 02/26/2020   Gastroesophageal reflux disease 02/15/2020   Diarrhea 02/15/2020   Sleep disorder 01/16/2020   Hyperlipidemia 07/11/2019   Right shoulder pain 04/04/2019   Pre-operative clearance 12/13/2018   Endometrial cancer (HCC) 04/05/2018   Patellofemoral pain syndrome of right knee 03/20/2018   Gastroenteritis 12/07/2017   Fatigue 11/16/2017   Morbid obesity (HCC) 08/11/2017   Stucco keratoses 08/11/2017   Essential hypertension 08/09/2017   History of endometrial cancer 08/09/2017   Lymphedema 08/09/2017   Menopause 08/09/2017   Malignant neoplastic disease (HCC) 04/07/2017   Cellulitis 09/18/2016   Benign hypertension 06/02/2016   Asthma 06/04/1969    PCP: Eather Golder MD   REFERRING PROVIDER: Eather Golder MD   REFERRING DIAG: s/p R hip replacement  THERAPY DIAG:  Stiffness of right hip, not elsewhere classified  Pain in right hip  Difficulty in walking, not elsewhere classified  Muscle weakness  (generalized)  Unsteadiness on feet  Rationale for Evaluation and Treatment: Rehabilitation  ONSET DATE: 11/14/23  SUBJECTIVE:   SUBJECTIVE STATEMENT:   Patient reports feeling better after last visit. States just a dull ache around 2/10 R gluteal pain.    PERTINENT HISTORY: s/p R hip arthropaslasty 11/14/23 with Dr. Clyda Dark posterior approach, blood coagulation disorder, PTSD, anxiety, cervical radiculopathy dypsnea, polyarthralgia, Raynaud disease, s/p closure of ileostomy, Hashimoto thyroid , endometrial cancer, menopause, endometrial cancer, lymphedema. Patient had home health PT prior to PT here.   PAIN:  Are you having pain?  2/10 Rt posterior deep hip and medial posterior gluteals   PRECAUTIONS: Other: recovering from hip sx  RED FLAGS: None   WEIGHT BEARING RESTRICTIONS: No  FALLS:  Has patient fallen in last 6 months? Yes. Number of falls 2  LIVING ENVIRONMENT: Lives with: lives with their spouse Lives in: House/apartment Stairs: No Has following equipment at home: Single point cane  OCCUPATION: retired,   PLOF: Independent  PATIENT GOALS: return to hiking, working out, gardening.   NEXT MD VISIT: March  2025   OBJECTIVE:  Note: Objective measures were completed at Evaluation unless otherwise noted.  DIAGNOSTIC FINDINGS:  IMPRESSION: "Right hip replacement.  No visible complicating feature."  PATIENT SURVEYS:  LEFS 5/80  COGNITION: Overall cognitive status: Within functional limits for tasks assessed    MUSCLE LENGTH: Hamstrings: limited bilaterally with R>L   LOWER EXTREMITY ROM:  Passive ROM Right  04/02/24 Right eval Left eval  Hip flexion >90 degrees 48 flexion PROM supine   Hip extension 0 degrees    Hip abduction ~25 degrees 5 degrees AROM     (Blank rows = not tested)  LOWER EXTREMITY MMT:  MMT Right 04/02/24 Right eval Left eval  Hip flexion 4/5 2+ 5  Hip extension (supine 30 deg) 2-/5   5  Hip Hor ADD 5/5    Hip abduction 2+/5  2+*  5  Hip Hor ABDCT 4+/5    Hip adduction  2+ 5  Hip internal rotation 4+/5    Hip external rotation 3-/5     Knee flexion 5/5 3 5   Knee extension 4+/5 3 5   Ankle dorsiflexion 5/5 3 5   Ankle plantarflexion 5/5 3 5    (Blank rows = not tested)   FUNCTIONAL TESTS:  5 times sit to stand: 29 6 minute walk test:  10 meter walk test: 15 seconds with SPC   GAIT: Distance walked: 60 ft Assistive device utilized: Single point cane Level of assistance: CGA Comments: antalgic gait pattern with decreased stance time RLE. Slight vaulting pattern.  Stair negotiation: -step to pattern lead with LLE, one hand on cane one hand on rail ascending. Descending step to pattern down with RLE.                                                                                                                               TREATMENT DATE:  04/04/2024    Therapeutic Activities: dynamic therapeutic activities  designed to achieve improved functional performance   - Standing Hip ext + (gluteal squeeze at endpoint) 2 x 10 reps   -hooklying bridge 2x10 (no pain)   -RLE hip SLR +Hip ABD/ADD (lifting LE up/over cone)  x12 in supine   - RLE (supine/hooklye) - knee fallout (ER) with RTB 2x 10 and knee fall in RTB 2 x 10    -RLE hip hooklying marching 2x10 RTB   -Sit to stand with LLE on airex pad to emphasize RLE Weight bearing.  -Standing ham curls with BUE Support x 15 reps  - Standing calf raises with BUE Support 1 set of 10 normal and 1 set of 10 each (toes out) and Left toe in/RLE normal to adhere to posterior Hip precautions)     PATIENT EDUCATION:  Education details: exercise technique Person educated: Patient Education method: Explanation, Demonstration, Tactile cues, and Verbal cues Education comprehension: verbalized understanding, returned demonstration, verbal cues required, tactile cues required, and needs further education  HOME EXERCISE PROGRAM: Access Code: [facility] URL:  [email or web address] Date: 02/08/2024; updated 03/21/2024 Prepared by: [employer]  Exercises - Side Stepping with Counter Support  - 1 x daily - 7 x weekly - 2 sets - 10 reps - 5 hold - Standing Hip Extension  - 1 x daily - 7 x weekly - 2 sets - 10 reps - 5 hold - Standing March with Counter Support  - 1 x daily - 7 x weekly - 2 sets - 10 reps - 5 hold - Standing Hip Abduction with Counter Support  - 1 x daily - 7 x weekly - 2 sets - 10 reps - Seated Hamstring Stretch  - 1 x daily - 7 x weekly - 2 sets - 2 reps - 30 hold - Sit to Stand  - 1 x daily - 7 x weekly - 2 sets - 10 reps - 5 hold - Supine Heel Slide  - 1 x daily - 7 x weekly - 2 sets - 6 reps - Supine Gluteal Sets  - 1 x daily - 7 x weekly - 2 sets - 10 reps - Supine Bridge  - 1 x daily - 7 x weekly - 3 sets - 5 reps - 2-3sec hold - Supine Short Arc Quad  - 1 x daily - 7 x weekly - 3 sets - 15 reps - Small Range Straight Leg Raise  - 1 x daily - 7 x weekly - 3 sets - 5 reps - Hooklying Clamshell with Resistance  - 1 x daily - 7 x weekly - 3 sets - 15 reps - Prone Hip Extension with Bent Knee  - 1 x daily - 7 x weekly - 3 sets - 10 reps - Prone Hip Extension Sequence  - 1 x daily - 7 x weekly - 3 sets - 10 reps  ASSESSMENT: CLINICAL IMPRESSION:   Treatment continued per plan of care- focusing on RLE strength and function. She presented with some right gluteal weakness yet able to accept some light resistance without report of any significant R hip pain. Patient reported she could really feel the muscles working but continued to deny pain throughout session.  Pt will continue to benefit from skilled therapy to address remaining deficits in order to improve overall QoL and return to PLOF.      OBJECTIVE IMPAIRMENTS: Abnormal gait, decreased activity tolerance, decreased balance, decreased coordination, decreased endurance, decreased mobility, difficulty walking, decreased ROM, decreased strength,  hypomobility, increased edema, impaired perceived functional ability, impaired flexibility, improper body mechanics, postural dysfunction, and pain.   ACTIVITY LIMITATIONS: carrying, lifting, bending, sitting, standing, squatting, sleeping, stairs, transfers, bed mobility, bathing, toileting, dressing, reach over head, hygiene/grooming, locomotion level, and caring for others  PARTICIPATION LIMITATIONS: meal prep, cleaning, laundry, interpersonal relationship, driving, shopping, community activity, and yard work  PERSONAL FACTORS: Age, Past/current experiences, Time since onset of injury/illness/exacerbation, and 3+ comorbidities: blood coagulation disorder, PTSD, anxiety, cervical radiculopathy dypsnea, polyarthralgia, Raynaud disease, s/p closure of ileostomy, Hashimoto thyroid , endometrial cancer, menopause, endometrial cancer, lymphedema  are also affecting patient's functional outcome.   REHAB POTENTIAL: Good  CLINICAL DECISION MAKING: Evolving/moderate complexity  EVALUATION COMPLEXITY: Moderate   GOALS: Goals reviewed with patient? Yes  SHORT TERM GOALS: Target date: 01/19/2024  Patient will be independent in home exercise program to improve strength/mobility for better functional independence with ADLs. Baseline:1/30: HEP given 3/12: pt reports completing 3-4 days a week depending on soreness.  Goal status:  MET  LONG TERM GOALS: Target date: 06/04/2024  Patient (> 60  years old) will complete five times sit to stand test in < 10 seconds indicating an increased LE strength and improved balance. Baseline: 1/30: 29 seconds 3/12: 19.41 03/12/2024= 17.44 seconds- without UE support Goal status: PROGRESSING  2.  Patient will increase six minute walk test distance to >1000 for progression to community ambulator and improve gait ability Baseline: 430 ft in 5 min and required seated rest to end test due to fatigue, uses SPC 884ft with SPC and no standing or seated rest break; 03/12/2024=  950 feet with SPC Goal status: in progress   3.  Patient will increase 10 meter walk test to >1.100m/s as to improve gait speed for better community ambulation and to reduce fall risk. Baseline: 1/30: 15 seconds with SPC  Without SPC: 10.63 and 10.38 speed: 0.95 m/s; with SPC 10.98 and 11.18; 03/12/2024= 1.0 m/s  Goal status: PROGRESSING  4.  Patient will increase lower extremity functional scale to >60/80 to demonstrate improved functional mobility and increased tolerance with ADLs.  Baseline: 1/30: 5/80 3/12: 20/80; 03/12/2024= 29/80 Goal status: Progressing  PLAN:  PT FREQUENCY: 1-2x/week PT DURATION: 12 weeks PLANNED INTERVENTIONS: 97164- PT Re-evaluation, 97110-Therapeutic exercises, 97530- Therapeutic activity, 97112- Neuromuscular re-education, 97535- Self Care, 64332- Manual therapy, 97116- Gait training, 97760- Splinting, 97014- Electrical stimulation (unattended), [id number]- Electrical stimulation (manual), [id number]- Ultrasound, 95188- Traction (mechanical), Patient/Family education, Balance training, Stair training, Taping, Joint mobilization, Joint manipulation, Spinal manipulation, Spinal mobilization, Manual lymph drainage, Scar mobilization, Compression bandaging, Vestibular training, Visual/preceptual remediation/compensation, DME instructions, Cryotherapy, and Moist heat  PLAN FOR NEXT SESSION:   Continue with focal strength interventions, consider updating HEP to more simplified exercises.   5:35 PM, 04/04/24 Ossie Blend, PT Physical Therapist - [facility]  [phone] (ASCOM)   04/04/24, 5:35 PM

## 2024-04-04 NOTE — Progress Notes (Signed)
 [facility] MD OP Progress Note  04/04/2024 10:05 AM [name]  MRN:  [id number]  Chief Complaint:  Chief Complaint  Patient presents with   Follow-up   Anxiety   Depression   Medication Refill   Discussed the use of AI scribe software for clinical note transcription with the patient, who gave verbal consent to proceed.  History of Present Illness Elizabeth Martin is a 63 year old Caucasian female, retired, lives in [place], married, has a history of MDD, GAD, PTSD, mixed connective tissue disease, ANA positive, long-term use of immunosuppressant medication, history of endometrial cancer, chronic pain, status post hip surgery was evaluated in office today for a follow-up appointment.  She experiences increased anxiety symptoms, characterized by constant worry about various aspects of daily life, such as her husband's schedule and daily tasks. These thoughts are described as 'constant' and more frequent than in the past. She has been on Cymbalta  for anxiety for twelve years, currently at a dose of 30 mg, but questions its effectiveness. She also takes Buspar  for anxiety, prescribed by her primary care provider.  Her symptoms of  depression is improving, and she is undergoing EMDR therapy for PTSD. The PTSD symptoms are well-managed, but the therapy has led to some exacerbation of anxiety symptoms. She is currently taking Wellbutrin  for depression, which helps with motivation and fatigue.  She experiences occasional insomnia, waking up with racing thoughts. She takes trazodone  50 mg at night, and this occurs once every couple of weeks.  She underwent a hip replacement four months ago and experiences pain in the hip area, especially when driving. She takes Celebrex  100 mg twice daily for pain management. She also has lymphedema, which complicates her condition.  Her blood pressure is usually around 120/60, but it was elevated during the visit, possibly due to pain or anxiety.  Denies  thoughts of self-harm or harm to others.     Visit Diagnosis:    ICD-10-CM   1. Recurrent major depressive disorder, in partial remission (HCC)  F33.41 DULoxetine  (CYMBALTA ) 30 MG capsule    venlafaxine  XR (EFFEXOR  XR) 37.5 MG 24 hr capsule    2. PTSD (post-traumatic stress disorder)  F43.10 venlafaxine  XR (EFFEXOR  XR) 37.5 MG 24 hr capsule    3. GAD (generalized anxiety disorder)  F41.1 venlafaxine  XR (EFFEXOR  XR) 37.5 MG 24 hr capsule      Past Psychiatric History: I have reviewed past psychiatric history from progress note on 11/10/2023.  Past trials of Cymbalta .  Past Medical History:  Past Medical History:  Diagnosis Date   Anxiety    Asthma    Autoimmune disease (HCC)    Cancer (HCC)    Endometrial lymph node removal (30)   Corneal abrasion, right 05/24/2022   Depression    GERD (gastroesophageal reflux disease)    H/O blood clots    upper rt groin and behind both knees   History of hiatal hernia    Hypertension    Lymphedema    right leg   Morphea scleroderma    PONV (postoperative nausea and vomiting)    states gets violently ill    Past Surgical History:  Procedure Laterality Date   BASAL CELL CARCINOMA EXCISION Left    CHOLECYSTECTOMY  2008   FLEXIBLE SIGMOIDOSCOPY N/A 05/19/2022   Procedure: FLEXIBLE SIGMOIDOSCOPY;  Surgeon: Toledo, Alphonsus Jeans, MD;  Location: [facility];  Service: Gastroenterology;  Laterality: N/A;   HERNIA REPAIR  2004   ILEOSTOMY CLOSURE N/A 08/24/2022   Procedure:  ILEOSTOMY TAKEDOWN, open loop with Apolonio Bay, PA-C to assist;  Surgeon: Emmalene Hare, MD;  Location: [facility];  Service: General;  Laterality: N/A;   IVC FILTER INSERTION N/A 11/09/2023   Procedure: IVC FILTER INSERTION;  Surgeon: Celso College, MD;  Location: [facility];  Service: Cardiovascular;  Laterality: N/A;   IVC FILTER REMOVAL N/A 01/02/2024   Procedure: IVC FILTER REMOVAL;  Surgeon: Celso College, MD;  Location: [facility];  Service:  Cardiovascular;  Laterality: N/A;   PLANTAR FASCIECTOMY     ROBOTIC ASSISTED TOTAL HYSTERECTOMY WITH BILATERAL SALPINGO OOPHERECTOMY  02/14/2012   ROTATOR CUFF REPAIR Left 03/24/2022   TONSILLECTOMY     TOTAL HIP ARTHROPLASTY Right 11/14/2023   Procedure: TOTAL HIP ARTHROPLASTY - posterior;  Surgeon: Venus Ginsberg, MD;  Location: [facility];  Service: Orthopedics;  Laterality: Right;   XI ROBOTIC ASSISTED LOWER ANTERIOR RESECTION N/A 05/24/2022   Procedure: XI ROBOTIC ASSISTED LOWER ANTERIOR RESECTION;  Surgeon: Emmalene Hare, MD;  Location: [facility];  Service: General;  Laterality: N/A;    Family Psychiatric History: I have reviewed family psychiatric history from progress note on 11/10/2023.  Family History:  Family History  Problem Relation Age of Onset   Mental illness Neg Hx     Social History: I have reviewed social history from progress note on 11/10/2023. Social History   Socioeconomic History   Marital status: Married    Spouse name: Not on file   Number of children: Not on file   Years of education: Not on file   Highest education level: Not on file  Occupational History   Not on file  Tobacco Use   Smoking status: Never   Smokeless tobacco: Never  Vaping Use   Vaping status: Never Used  Substance and Sexual Activity   Alcohol use: Yes   Drug use: Never   Sexual activity: Not Currently  Other Topics Concern   Not on file  Social History Narrative   ** Merged History Encounter **       Social Drivers of Health   Financial Resource Strain: Low Risk  (08/20/2023)   Received from [facility]   Overall Financial Resource Strain (CARDIA)    Difficulty of Paying Living Expenses: Not hard at all  Food Insecurity: No Food Insecurity (11/14/2023)   Hunger Vital Sign    Worried About Running Out of Food in the Last Year: Never true    Ran Out of Food in the Last Year: Never true  Transportation Needs: No Transportation Needs (11/14/2023)   PRAPARE - Therapist, art (Medical): No    Lack of Transportation (Non-Medical): No  Physical Activity: Not on file  Stress: Not on file  Social Connections: Unknown (02/01/2023)   [facility]   Social Network    Social Network: Not on file    Allergies:  Allergies  Allergen Reactions   Carboplatin Anaphylaxis        Hydromorphone  Anaphylaxis    Stopped breathing  Respiratory arrest - due to dosage administered per the patient.   Latex Hives, Itching and Rash   Nitrofurantoin Nausea And Vomiting   Silicone Itching and Rash   Codeine Nausea And Vomiting   Other Nausea And Vomiting    Any "codone" medications "Violently sick even with zofran " per patient report    Wheat Other (See Comments)    Body aches and swelling    Oxycodone  Nausea And  Vomiting    Metabolic Disorder Labs: No results found for: "HGBA1C", "MPG" No results found for: "PROLACTIN" No results found for: "CHOL", "TRIG", "HDL", "CHOLHDL", "VLDL", "LDLCALC" Lab Results  Component Value Date   TSH 2.066 12/21/2023    Therapeutic Level Labs: No results found for: "LITHIUM" No results found for: "VALPROATE" No results found for: "CBMZ"  Current Medications: Current Outpatient Medications  Medication Sig Dispense Refill   acetaminophen  (TYLENOL ) 500 MG tablet Take 2 tablets (1,000 mg total) by mouth every 8 (eight) hours. 30 tablet 0   albuterol  (VENTOLIN  HFA) 108 (90 Base) MCG/ACT inhaler Inhale 2 puffs into the lungs every 6 (six) hours as needed for shortness of breath or wheezing.     Alum Hydroxide-Mag Carbonate (GAVISCON PO) Take 4 tablets by mouth daily as needed (upset stomach).     budesonide-formoterol (SYMBICORT) 80-4.5 MCG/ACT inhaler Inhale 2 puffs into the lungs every 4 (four) hours as needed (Shortness of breath).     buPROPion  (WELLBUTRIN  XL) 150 MG 24 hr tablet TAKE 1 TABLET (150 MG TOTAL) BY MOUTH IN THE MORNING 90 tablet 0   busPIRone  (BUSPAR ) 10 MG  tablet Take 10 mg by mouth 2 (two) times daily.     celecoxib  (CELEBREX ) 200 MG capsule Take 200 mg by mouth.     cetirizine (ZYRTEC) 10 MG tablet Take 10 mg by mouth daily.     CLOBETASOL PROPIONATE E 0.05 % emollient cream Apply topically.     desonide (DESOWEN) 0.05 % cream Apply 1 Application topically 2 (two) times daily as needed (morphea flare).     famotidine -calcium carbonate-magnesium  hydroxide (PEPCID  COMPLETE) [phone] MG chewable tablet Chew 1 tablet by mouth daily as needed (heartburn).     folic acid (FOLVITE) 1 MG tablet Take 1 mg by mouth daily.     hydrocortisone-pramoxine (ANALPRAM-HC) 2.5-1 % rectal cream Place 1 Application rectally 3 (three) times daily as needed for hemorrhoids.     hydroxychloroquine  (PLAQUENIL ) 200 MG tablet Take 200 mg by mouth 2 (two) times daily.     lisinopril  (ZESTRIL ) 40 MG tablet Take 40 mg by mouth daily.     lubiprostone (AMITIZA) 24 MCG capsule Take by mouth.     methotrexate (RHEUMATREX) 2.5 MG tablet Take 10 mg by mouth 2 (two) times a week. Take 10 mg on Monday and Tuesday     omeprazole (PRILOSEC) 40 MG capsule Take 40 mg by mouth daily.     ondansetron  (ZOFRAN -ODT) 4 MG disintegrating tablet Take 4 mg by mouth every 8 (eight) hours as needed for nausea or vomiting.     promethazine  (PHENERGAN ) 12.5 MG tablet Take 12.5 mg by mouth every 8 (eight) hours as needed.     silver sulfADIAZINE (SILVADENE) 1 % cream Apply 1 Application topically daily.     traZODone  (DESYREL ) 50 MG tablet TAKE 1 TABLET BY MOUTH AT BEDTIME AS NEEDED FOR SLEEP. 90 tablet 0   triamcinolone (KENALOG) 0.025 % cream Apply 1 Application topically 2 (two) times daily as needed (morphea flare).     trospium  (SANCTURA ) 20 MG tablet Take 1 tablet (20 mg total) by mouth 2 (two) times daily as needed (overactive bladder). 180 tablet 1   venlafaxine  XR (EFFEXOR  XR) 37.5 MG 24 hr capsule Take 1 capsule (37.5 mg total) by mouth daily with breakfast. Start after stopping Duloxetine   as instructed 30 capsule 1   DULoxetine  (CYMBALTA ) 30 MG capsule Take 1 capsule (30 mg total) by mouth as directed for 5 days. Take every  other day for 5 doses and stop     No current facility-administered medications for this visit.     Musculoskeletal: Strength & Muscle Tone: within normal limits Gait & Station: normal Patient leans: N/A  Psychiatric Specialty Exam: Review of Systems  Psychiatric/Behavioral:  The patient is nervous/anxious.     Blood pressure (!) 140/84, pulse 90, temperature 98.7 F (37.1 C), temperature source Temporal, height 5\' 6"  (1.676 m), weight 174 lb 6.4 oz (79.1 kg), SpO2 95%.Body mass index is 28.15 kg/m.  General Appearance: Fairly Groomed  Eye Contact:  Fair  Speech:  Clear and Coherent  Volume:  Normal  Mood:  Anxious  Affect:  Congruent  Thought Process:  Goal Directed and Descriptions of Associations: Intact  Orientation:  Full (Time, Place, and Person)  Thought Content: Logical   Suicidal Thoughts:  No  Homicidal Thoughts:  No  Memory:  Immediate;   Fair Recent;   Fair Remote;   Fair  Judgement:  Fair  Insight:  Fair  Psychomotor Activity:  Normal  Concentration:  Concentration: Fair and Attention Span: Fair  Recall:  [employer] of Knowledge: Fair  Language: Fair  Akathisia:  No  Handed:  Right  AIMS (if indicated): not done  Assets:  Desire for Improvement Housing Social Support Transportation  ADL's:  Intact  Cognition: WNL  Sleep:   Improving   Screenings: GAD-7    Loss adjuster, chartered Office Visit from 04/04/2024 in [facility] Counselor from 03/20/2024 in [facility] Counselor from 11/25/2023 in [facility] Office Visit from 11/10/2023 in [facility]  Total GAD-7 Score 7 3 20 17       PHQ2-9    Flowsheet Row Office Visit from 04/04/2024 in [facility] Counselor from 03/20/2024 in [facility] Counselor from 11/25/2023 in [facility] Office Visit from 11/10/2023 in [facility]  PHQ-2 Total Score 1 2 6 5   PHQ-9 Total Score 10 5 23 21       Flowsheet Row Office Visit from 04/04/2024 in [facility] Counselor from 03/20/2024 in [facility] Video Visit from 01/31/2024 in [facility]  C-SSRS RISK CATEGORY No Risk No Risk No Risk        Assessment and Plan: Elizabeth Martin is a 63 year old Caucasian female with history of depression, anxiety, multiple medical problems including mixed connective tissue disease, chronic pain presented for a follow-up appointment, discussed assessment and plan as noted below.  Assessment & Plan Generalized anxiety disorder-unstable Generalized anxiety disorder with persistent worry and racing thoughts, exacerbated by EMDR therapy. Current treatment includes Cymbalta  and Buspar . Cymbalta  may not be effective as it has been used for 12 years and is currently at a low dose of 30 mg. Discussed switching to venlafaxine  (Effexor ) to better target anxiety symptoms. Venlafaxine  is an SNRI like Cymbalta , affecting serotonin and norepinephrine receptors, but can cause hypertension and withdrawal symptoms if stopped abruptly. It takes 6-8 weeks to build up in the system and may require gradual dosage increase. - Discontinue Cymbalta  by taking it every other day for the next five days, then stop. - Initiate Venlafaxine  (Effexor ) after stopping Cymbalta , starting at 37.5 mg. - Monitor blood pressure regularly while on Venlafaxine . - Provide information about Venlafaxine  including withdrawal symptoms and the need  for gradually tapering off the medication if not  effective in the future. - Schedule follow-up in 4-6 weeks, possibly via video visit.  Post-traumatic stress disorder (PTSD)-improving PTSD symptoms are improving with ongoing EMDR therapy. -  Continue Trazodone  50 mg at bedtime - Consider addition of Prazosin as needed in the future  Major depression in partial remission Major depression is well-managed with Wellbutrin . No current depressive symptoms reported. - Continue BuSpar  10 mg twice a day - Continue Wellbutrin  150 mg daily   Follow-up Monitor response to medication changes and blood pressure. - Schedule follow-up in 4-6 weeks, possibly via video visit. - Contact provider via MyChart for any questions or concerns.   Collaboration of Care: Collaboration of Care: Referral or follow-up with counselor/therapist AEB patient encouraged to continue CBT  Patient/Guardian was advised Release of Information must be obtained prior to any record release in order to collaborate their care with an outside provider. Patient/Guardian was advised if they have not already done so to contact the registration department to sign all necessary forms in order for us  to release information regarding their care.   Consent: Patient/Guardian gives verbal consent for treatment and assignment of benefits for services provided during this visit. Patient/Guardian expressed understanding and agreed to proceed.  This note was generated in part or whole with voice recognition software. Voice recognition is usually quite accurate but there are transcription errors that can and very often do occur. I apologize for any typographical errors that were not detected and corrected.     Jozalyn Baglio, MD 04/06/2024, 7:44 AM

## 2024-04-04 NOTE — Patient Instructions (Signed)
Venlafaxine Extended-Release Capsules What is this medication? VENLAFAXINE (VEN la fax een) treats depression and anxiety. It increases the amount of serotonin and norepinephrine in the brain, hormones that help regulate mood. It belongs to a group of medications called SNRIs. This medicine may be used for other purposes; ask your health care provider or pharmacist if you have questions. COMMON BRAND NAME(S): Effexor XR What should I tell my care team before I take this medication? They need to know if you have any of these conditions: Bleeding disorders Glaucoma Heart disease High blood pressure High cholesterol Kidney disease Liver disease Low levels of sodium in the blood Mania or bipolar disorder Seizures Suicidal thoughts, plans, or attempt by you or a family member Take medications that treat or prevent blood clots Thyroid disease An unusual or allergic reaction to venlafaxine, other medications, foods, dyes, or preservatives Pregnant or trying to get pregnant Breastfeeding How should I use this medication? Take this medication by mouth with a full glass of water. Take it as directed on the prescription label. Do not cut, crush, or chew this medication. Take it with food. You may open the capsule and put the contents in 1 teaspoon of applesauce. Swallow the medication and applesauce right away. Do not chew the medication or applesauce. Follow with a glass of water to ensure complete swallowing of the pellets. Try to take your medication at about the same time each day. Do not take your medication more often than directed. Keep taking this medication unless your care team tells you to stop. Stopping it too quickly can cause serious side effects. It can also make your condition worse. A special MedGuide will be given to you by the pharmacist with each prescription and refill. Be sure to read this information carefully each time. Talk to your care team about the use of this medication in  children. Special care may be needed. Overdosage: If you think you have taken too much of this medicine contact a poison control center or emergency room at once. NOTE: This medicine is only for you. Do not share this medicine with others. What if I miss a dose? If you miss a dose, take it as soon as you can. If it is almost time for your next dose, take only that dose. Do not take double or extra doses. What may interact with this medication? Do not take this medication with any of the following: Alcohol Certain medications for fungal infections, such as fluconazole, itraconazole, ketoconazole, posaconazole, voriconazole Cisapride Desvenlafaxine Dronedarone Duloxetine Levomilnacipran Linezolid MAOIs, such as Carbex, Eldepryl, Marplan, Nardil, and Parnate Methylene blue (injected into a vein) Milnacipran Pimozide Thioridazine This medication may also interact with the following: Amphetamines Aspirin and aspirin-like medications Certain medications for mental health conditions Certain medications for migraine headaches, such as almotriptan, eletriptan, frovatriptan, naratriptan, rizatriptan, sumatriptan, zolmitriptan Certain medications for sleep Certain medications that treat or prevent blood clots, such as dalteparin, enoxaparin, warfarin Cimetidine Clozapine Diuretics Fentanyl Furazolidone Indinavir Isoniazid Lithium Metoprolol NSAIDS, medications for pain and inflammation, such as ibuprofen or naproxen Other medications that cause heart rhythm changes Procarbazine Rasagiline Supplements, such as St. John's wort, kava kava, valerian Tramadol Tryptophan This list may not describe all possible interactions. Give your health care provider a list of all the medicines, herbs, non-prescription drugs, or dietary supplements you use. Also tell them if you smoke, drink alcohol, or use illegal drugs. Some items may interact with your medicine. What should I watch for while using  this medication? Tell   your care team if your symptoms do not get better or if they get worse. Visit your care team for regular checks on your progress. Because it may take several weeks to see the full effects of this medication, it is important to continue your treatment as prescribed by your care team. Watch for new or worsening thoughts of suicide or depression. This includes sudden changes in mood, behaviors, or thoughts. These changes can happen at any time but are more common in the beginning of treatment or after a change in dose. Call your care team right away if you experience these thoughts or worsening depression. This medication may cause mood and behavior changes, such as anxiety, nervousness, irritability, hostility, restlessness, excitability, hyperactivity, or trouble sleeping. These changes can happen at any time but are more common in the beginning of treatment or after a change in dose. Call your care team right away if you notice any of these symptoms. This medication can cause an increase in blood pressure. Check with your care team for instructions on monitoring your blood pressure while taking this medication. This medication may affect your coordination, reaction time, or judgment. Do not drive or operate machinery until you know how this medication affects you. Sit up or stand slowly to reduce the risk of dizzy or fainting spells. Drinking alcohol with this medication can increase the risk of these side effects. Your mouth may get dry. Chewing sugarless gum or sucking hard candy and drinking plenty of water may help. Contact your care team if the problem does not go away or is severe. What side effects may I notice from receiving this medication? Side effects that you should report to your care team as soon as possible: Allergic reactions--skin rash, itching, hives, swelling of the face, lips, tongue, or throat Bleeding--bloody or black, tar-like stools, red or dark brown urine,  vomiting blood or brown material that looks like coffee grounds, small, red or purple spots on skin, unusual bleeding or bruising Heart rhythm changes--fast or irregular heartbeat, dizziness, feeling faint or lightheaded, chest pain, trouble breathing Increase in blood pressure Loss of appetite with weight loss Low sodium level--muscle weakness, fatigue, dizziness, headache, confusion Serotonin syndrome--irritability, confusion, fast or irregular heartbeat, muscle stiffness, twitching muscles, sweating, high fever, seizures, chills, vomiting, diarrhea Sudden eye pain or change in vision such as blurry vision, seeing halos around lights, vision loss Thoughts of suicide or self-harm, worsening mood, feelings of depression Side effects that usually do not require medical attention (report to your care team if they continue or are bothersome): Anxiety, nervousness Change in sex drive or performance Dizziness Dry mouth Excessive sweating Nausea Tremors or shaking Trouble sleeping This list may not describe all possible side effects. Call your doctor for medical advice about side effects. You may report side effects to FDA at 1-800-FDA-1088. Where should I keep my medication? Keep out of the reach of children and pets. Store at a controlled temperature between 20 and 25 degrees C (68 degrees and 77 degrees F), in a dry place. Throw away any unused medication after the expiration date. NOTE: This sheet is a summary. It may not cover all possible information. If you have questions about this medicine, talk to your doctor, pharmacist, or health care provider.  2024 Elsevier/Gold Standard (2022-11-18 00:00:00)  

## 2024-04-05 ENCOUNTER — Ambulatory Visit: Admitting: Psychiatry

## 2024-04-05 ENCOUNTER — Ambulatory Visit: Attending: Family Medicine | Admitting: Occupational Therapy

## 2024-04-05 DIAGNOSIS — R262 Difficulty in walking, not elsewhere classified: Secondary | ICD-10-CM | POA: Diagnosis present

## 2024-04-05 DIAGNOSIS — M25651 Stiffness of right hip, not elsewhere classified: Secondary | ICD-10-CM | POA: Diagnosis present

## 2024-04-05 DIAGNOSIS — I890 Lymphedema, not elsewhere classified: Secondary | ICD-10-CM

## 2024-04-05 DIAGNOSIS — M25551 Pain in right hip: Secondary | ICD-10-CM | POA: Insufficient documentation

## 2024-04-05 DIAGNOSIS — R2681 Unsteadiness on feet: Secondary | ICD-10-CM | POA: Diagnosis present

## 2024-04-05 DIAGNOSIS — M6281 Muscle weakness (generalized): Secondary | ICD-10-CM | POA: Diagnosis present

## 2024-04-05 NOTE — Therapy (Addendum)
 OUTPATIENT OCCUPATIONAL THERAPY TREATMENT NOTE  LOWER EXTREMITY LYMPHEDEMA  Patient Name: Elizabeth Martin MRN: [id number] DOB:1961-05-25, 63 y.o., female Today's Date: 04/05/2024  REPORTING PERIOD:   END OF SESSION:  Lymphedema Episode 2   OT End of Session - 04/05/24 0831     Visit Number 28    Number of Visits 36    Date for OT Re-Evaluation 04/16/24    OT Start Time 0800    OT Stop Time 0900    OT Time Calculation (min) 60 min    Activity Tolerance Patient tolerated treatment well;No increased pain    Behavior During Therapy WFL for tasks assessed/performed              Past Medical History:  Diagnosis Date   Anxiety    Asthma    Autoimmune disease (HCC)    Cancer (HCC)    Endometrial lymph node removal (30)   Corneal abrasion, right 05/24/2022   Depression    GERD (gastroesophageal reflux disease)    H/O blood clots    upper rt groin and behind both knees   History of hiatal hernia    Hypertension    Lymphedema    right leg   Morphea scleroderma    PONV (postoperative nausea and vomiting)    states gets violently ill   Past Surgical History:  Procedure Laterality Date   BASAL CELL CARCINOMA EXCISION Left    CHOLECYSTECTOMY  2008   FLEXIBLE SIGMOIDOSCOPY N/A 05/19/2022   Procedure: FLEXIBLE SIGMOIDOSCOPY;  Surgeon: Toledo, Alphonsus Jeans, MD;  Location: [facility];  Service: Gastroenterology;  Laterality: N/A;   HERNIA REPAIR  2004   ILEOSTOMY CLOSURE N/A 08/24/2022   Procedure: ILEOSTOMY TAKEDOWN, open loop with Apolonio Bay, PA-C to assist;  Surgeon: Emmalene Hare, MD;  Location: [facility];  Service: General;  Laterality: N/A;   IVC FILTER INSERTION N/A 11/09/2023   Procedure: IVC FILTER INSERTION;  Surgeon: Celso College, MD;  Location: [facility];  Service: Cardiovascular;  Laterality: N/A;   IVC FILTER REMOVAL N/A 01/02/2024   Procedure: IVC FILTER REMOVAL;  Surgeon: Celso College, MD;  Location: [facility];  Service:  Cardiovascular;  Laterality: N/A;   PLANTAR FASCIECTOMY     ROBOTIC ASSISTED TOTAL HYSTERECTOMY WITH BILATERAL SALPINGO OOPHERECTOMY  02/14/2012   ROTATOR CUFF REPAIR Left 03/24/2022   TONSILLECTOMY     TOTAL HIP ARTHROPLASTY Right 11/14/2023   Procedure: TOTAL HIP ARTHROPLASTY - posterior;  Surgeon: Venus Ginsberg, MD;  Location: [facility];  Service: Orthopedics;  Laterality: Right;   XI ROBOTIC ASSISTED LOWER ANTERIOR RESECTION N/A 05/24/2022   Procedure: XI ROBOTIC ASSISTED LOWER ANTERIOR RESECTION;  Surgeon: Emmalene Hare, MD;  Location: [facility];  Service: General;  Laterality: N/A;   Patient Active Problem List   Diagnosis Date Noted   Constipation 01/10/2024   Nausea & vomiting 01/10/2024   S/P total right hip arthroplasty 11/14/2023   Blood coagulation disorder (HCC) 11/10/2023   PTSD (post-traumatic stress disorder) 11/10/2023   Severe episode of recurrent major depressive disorder, without psychotic features (HCC) 11/10/2023   GAD (generalized anxiety disorder) 11/10/2023   High risk medication use 11/10/2023   Anxiety and depression 03/22/2023   Cervical radiculopathy 03/16/2023   Colon cancer screening 03/15/2023   Chronic idiopathic constipation 03/15/2023   Dyspnea 03/07/2023   [facility] spotted fever 03/07/2023   Strain of gastrocnemius tendon 03/07/2023   Lower abdominal pain 03/07/2023   Pain in pelvis 03/07/2023   Small bowel  obstruction (HCC) 02/11/2023   Raynaud disease 02/11/2023   Polyarthralgia 02/11/2023   Edema 01/03/2023   Diverticular disease 12/10/2022   Hot flashes 12/10/2022   Pain of right lower extremity 12/10/2022   Paresthesia of lower extremity 12/10/2022   Primary insomnia 12/10/2022   Thyroid  nodule 12/10/2022   Multiple joint pain 12/10/2022   Basal cell carcinoma of skin 12/10/2022   Acute bilateral low back pain without sciatica 11/10/2022   Chronic lower back pain 09/05/2022   S/P closure of ileostomy 08/24/2022   Ileostomy in  place [facility])    Morphea 07/13/2022   Colonic stricture (HCC) 05/19/2022   Large bowel obstruction (HCC) 05/19/2022   Hypokalemia 05/18/2022   Abdominal pain 05/18/2022   Osteoarthritis of left knee 02/24/2022   Mixed connective tissue disease (HCC) 02/17/2022   Adhesive capsulitis of left shoulder 01/27/2022   Glenohumeral arthritis, left 01/27/2022   Microscopic hematuria 01/12/2022   Otorrhagia of right ear 12/10/2021   Elevated testosterone level in female 11/04/2021   Anti-TPO antibodies present 10/20/2021   Rash 10/13/2021   Hashimoto thyroiditis, 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   Hashimoto's disease 03/23/2021   Exposure to severe acute respiratory syndrome coronavirus 2 (SARS-CoV-2) 12/19/2020   Migraine without aura and without status migrainosus, not intractable 08/19/2020   Myofascial pain 07/30/2020   Neck pain 07/30/2020   Upper back pain 07/30/2020   Cervical facet joint syndrome 07/30/2020   Hair loss 07/14/2020   Edema of lower extremity 07/11/2020   Adenomatous colon polyp 02/26/2020   Gastroesophageal reflux disease 02/15/2020   Diarrhea 02/15/2020   Sleep disorder 01/16/2020   Hyperlipidemia 07/11/2019   Right shoulder pain 04/04/2019   Pre-operative clearance 12/13/2018   Endometrial cancer (HCC) 04/05/2018   Patellofemoral pain syndrome of right knee 03/20/2018   Gastroenteritis 12/07/2017   Fatigue 11/16/2017   Morbid obesity (HCC) 08/11/2017   Stucco keratoses 08/11/2017   Essential hypertension 08/09/2017   History of endometrial cancer 08/09/2017   Lymphedema 08/09/2017   Menopause 08/09/2017   Malignant neoplastic disease (HCC) 04/07/2017   Cellulitis 09/18/2016   Benign hypertension 06/02/2016   Asthma 06/04/1969    PCP: Eather Golder, MD  REFERRING PROVIDER: Eather Golder, MD  REFERRING DIAG: I89.0   THERAPY DIAG:  Lymphedema, not elsewhere classified  Rationale for Evaluation and Treatment: Rehabilitation  ONSET DATE: 2013   (Cancer-related, endometrial 2013)  SUBJECTIVE:                                                                                                                                                                                           SUBJECTIVE STATEMENT:Patient returns to OT for lymphedema  care to RLE/RLQ.  Pt does not rate LE-related RLE/RLQ pain numerically. Melissa Brand Cai , [employer] manufacturer's rep, is here to assist with new custom compression garment specifictions and measurements.    PERTINENT HISTORY: Asthma, Autoimmune Disease (Connective Tissue Disease), Endometrial Ca w/ LND ( 30 bilateral pelvic and periaortic LN), adjuvant chemotherapy and XRT,   H/O blood clots, RLE/RLQ lymphedema, Robotic assisted total hysterectomy w/ bilateral oophorectomy L Rotator Cuff repair, S/P ileostomy 2/2 colon stricture 05/2022; s/p R hip arthroplasty 11/11/23.   PAIN:  Are you having pain? YES. See subjective; not rated /10 Pain location: RLE, R hip, cervical spine Pain description: sore, aching, heavy, full, tight,  Aggravating factors: standing, walking, extended dependent sitting Relieving factors: elevation, movement  PRECAUTIONS: Fall and Other: LYMPHEDEMA  WEIGHT BEARING RESTRICTIONS: Yes 5# lifting restriction- shoulder  FALLS:  Has patient fallen in last 6 months? No  LIVING ENVIRONMENT: Lives with: lives with their spouse Lives in: House/apartment Stairs: No;  Has following equipment at home: Single point cane, Environmental consultant - 2 wheeled, Environmental consultant - 4 wheeled, and Wheelchair (manual)  OCCUPATION: retired Control and instrumentation engineer: gardening, hiking, biking, dancing- unable to participate in any of these due to pain and swelling  HAND DOMINANCE: right   PRIOR LEVEL OF FUNCTION: Independent with basic ADLs, Independent with household mobility without device, Independent with community mobility without device, Requires assistive device for independence, Needs assistance with homemaking, Needs  assistance with transfers, and Leisure: decreased  social participation for leisure pursuits due to impaired mobility and pain  PATIENT GOALS:  Be able to move freely without pain Reduce limb volume to be able to lift limb for basic daily ADLs and functional ambulation Reduce limb volume to increase ability to perform lower body dressing and bathing and grooming Reduce limb to increase body image  OBJECTIVE: Note: Objective measures were completed at Evaluation unless otherwise noted.  COGNITION:  Overall cognitive status: Within functional limits for tasks assessed   OBSERVATIONS / OTHER ASSESSMENTS:   POSTURE: WFL  LE ROM: WFL, but Limited mildly at R knee and ankle due to girth, skin approximation, and joint pain  LE MMT: WFL  Mild, Stage  II, Bilateral Lower Extremity Lymphedema 2/2 CVI and Obesity  Skin  Description Hyper-Keratosis Peau d' Orange Shiny Tight Fibrotic/ Indurated Fatty Doughy Spongy/ boggy   x x x x R>L Severe morphea at  R groin   x   Skin dry Flaky WNL Macerated   mildly sclerotic     Color Redness Varicosities Blanching Hemosiderin Stain Mottled   x x x   x   Odor Malodorous Yeast Fungal infection  WNL      x   Temperature Warm Cool wnl    x     Pitting Edema   1+ 2+ 3+ 4+ Non-pitting         x   Girth Symmetrical Asymmetrical                   Distribution    R>L RLE toes to groin    Stemmer Sign Positive Negative   +    Lymphorrhea History Of:  Present Absent     x    Wounds History Of Present Absent Venous Arterial Pressure Sheer     x        Signs of Infection Redness Warmth Erythema Acute Swelling Drainage Borders  Sensation Light Touch Deep pressure Hypersensitivity   In tact Impaired In tact Impaired Absent Impaired   x  x  x     Nails WNL   Fungus nail dystrophy   x     Hair Growth Symmetrical Asymmetrical    R>L   Skin Creases Base of toes  Ankles   Base of Fingers knees        Abdominal pannus Thigh Lobules  Face/neck   x x  x        BLE COMPARATIVE LIMB VOLUMETRICS 10/10/23  LANDMARK RIGHT  10/10/23  R LEG (A-D) 5466.5 ml  R THIGH (E-G) 9186.6 ml  R FULL LIMB (A-G) 14653.1 ml  Limb Volume differential (LVD)  LVD for LEG = 44.1%, R>L LVD for THIGH= 33.98%, R>L LVD for FULL lower extremity 37.8%, R>L  Volume change since last 08/11/22 R LEG is INCREASED in volume by 59.6%. L THIGH is INCREASED by 38 %, and RLE full limb is INCREASED by 45.38%.  Volume change overall V  (Blank rows = not tested)  LANDMARK LEFT  10/12/23  L LEG (A-D) 3053.6 ml  L THIGH (E-G) 6064.8 ml  L  FULL LIMB (A-G) 9118.4 ml  Limb Volume differential (LVD)  %  Volume change since initial %  Volume change overall %  (Blank rows = not tested)   10 th VISIT PROGRESS NOTE RLE COMPARATIVE LIMB VOLUMETRICS 01/04/24: Deferred until next visit by Pt request.  LANDMARK RIGHT    R LEG (A-D) TBA  R THIGH (E-G) TBA  R FULL LIMB (A-G) TBA  Limb Volume differential (LVD)    Volume change since last 08/11/22 TBA  Volume change overall TBA  (Blank rows = not tested)  20 th VISIT PROGRESS NOTE RLE COMPARATIVE LIMB VOLUMETRICS TBA next session  LANDMARK RIGHT    R LEG (A-D) 6005.7 ml  R THIGH (E-G) 9400.3 ml  R FULL LIMB (A-G) 15406.02 ml  Limb Volume differential (LVD)    Volume change since last measured on 10/10/23 R LEG is INCREASED in volume by 9.9%. L THIGH is INCREASED by 2.32 %, and RLE full limb is INCREASED by 5.13% since last measured on 10/10/23.  Volume change since commencing OT for CDT %    28 th VISIT PROGRESS NOTE RLE COMPARATIVE LIMB VOLUMETRICS TBA next session  LANDMARK RIGHT    R LEG (A-D) 5936.1 ml  R THIGH (E-G) 8538.6  ml  R FULL LIMB (A-G) 14524.7  ml  Limb Volume differential (LVD)    Volume change since last measured on 10/10/23 R LEG is DECREASED in volume by 1.2 %.  Since last measured on 03/14/24. R THIGH is DECREASED by 8.6 %, and RLE full limb is DECREASED by  5.72 % since last measured on 03/14/24.  Volume change since commencing OT for CDT %   ENDOMETRIAL CA-related LYMPHEDEMA Hx:  SURGERY TYPE/DATE: 2013 NUMBER OF LYMPH NODES REMOVED: 30 by report- bilateral pelvic and periaortic CHEMOTHERAPY: yes RADIATION:yes INFECTIONS: no hx cellulitis GAIT: Distance walked: >500 ft Assistive device utilized: single point cane Level of assistance: Modified independence- extra time Comments: R limp  LYMPHEDEMA LIFE IMPACT SCALE (LLIS): Intake 10/12/23 75% (The extent to which lymphedema related problems impacted your life over the past week)  FOTO (functional outcome measure):10/10/23 INTAKE: 36% No Out take score. FOTO assessment discontinued by clinic.  PATIENT EDUCATION:  Education details: Continued Pt/ CG edu for lymphedema self care home program throughout session. Topics include outcome of comparative  limb volumetrics- starting limb volume differentials (LVDs), technology and gradient techniques used for short stretch, multilayer compression wrapping, simple self-MLD, therapeutic lymphatic pumping exercises, skin/nail care, LE precautions,. compression garment recommendations and specifications, wear and care schedule and compression garment donning / doffing w assistive devices. Discussed progress towards all OT goals since commencing CDT. All questions answered to the Pt's satisfaction. Good return. Person educated: Patient Education method: Explanation, Demonstration, and Handouts Education comprehension: verbalized understanding, returned demonstration, and needs further education  LE SELF-CARE HOME  PROGRAM: Simple self-MLD/daily to affected quadrant and body part At least 2 x daily BLE Lymphatic Pumping There ex 1 set of 10 reps each, in order, bilaterally Daily skin care to affected body part to limit infection risk and increase skin excursion Compression Bandaging Intensive stage compression: multilayer short stretch wraps with gradient  techniques. One limb at a time. Length patient dependent. Self-management Phase: Appropriate daytime compression garment and hours-of-sleep device  Compression garments: Custom-made gradient compression garments and hours-of-sleep devices are medically necessary because they are uniquely sized and shaped to fit the exact dimensions of the affected extremities and to provide accurate and consistent gradient compression and containment, essential  for optimally managing chronic, progressive lymphedema. The convoluted HOS devices are medically necessary to facilitate increased lymphatic circulation and limit fibrosis formation when sleeping. Multiple custom compression garments are needed for optimal hygiene to limit infection risk. Custom compression garments should be replaced q 3-6 months When worn consistently for optimal lipo-lymphedema self-management over time.  ASSESSMENT:  CLINICAL IMPRESSION:  Progress towards limb volume reduction remain slow. Lymphedema management  is very challenging as Pt experiences frequent flare ups of systemic inflammation 2/2 mixed connective tissue disease. R thigh has softened minimally, but leg and ankle remain very hard and tight with dense, high protein, lymphatic congestion. We completed RLE limb volumetrics to be able to troubleshoot if new garments, also measured for today, are poorly fitting. R LEG volume is reduced by 1.2% since last measured on 4/ 9/25. Thigh is reduced by 8.6%, and full limb reduction measures 5.72% since 4/9. Team decided on 3 piece garment system for optimal compression and fit.  Custom Elvarex ccl 3 F (34-40 mmHg)thigh length compression Capri overlapping CCL 2 ( 25-32 mmHg) Elvarex classic knee high ( for added compression) with CCL 1 (18-21 mmHg) Bike shorts to address swelling in R buttock and R inguinal morphea  Fit custom garments ASAP. Pt will continue with compression wraps on clinic days. Cont CDT 3 x weekly as per POC.   Cont as  per POC.  10/10/23 Lymphedema Episode 2, Initial OT Evaluation : Anaila Colby is a 27 y a female presenting with chronic, progressive, moderate, Stage II, RLE/RLQ cancer -related lymphedema with onset 11 years ago after Rx for endometrial cancer. Pt is well known to this therapist as she has successfully undergone Complete Decongestive Therapy (CDT)  this clinic bin the past. Pt reports recent exacerbation of RLE swelling worsened as inflammation in L hip has worsened over time.  Pt has a hip replacement scheduled in late December here it [facility]. RE limb volumetrics today reveal that R LEG volume is dramatically increased by 59.65 since last visit. R LEG VOLUME is INCREASED in volume by 59.6%. L THIGH is INCREASED by 38 %, and RLE full limb is INCREASED by 45.38%. Pt presents with LE-related skin changes. Prolonged inflammation in the R hip joint has likely overloaded  already RLE/RLQ lymphatics.  RLE/RLQ lymphedema limits Pt's ability to perform basic and  instrumental ADLs, including functional ambulation, mobility and transfers, grooming , lower body bathing and dressing, skin inspection and skin care. Pt has difficulty  reaching her lower legs and feet to apply compression wraps and don/doff        compression stockings. LE limits ability to P[perform instrumental ADLs, including driving, yard work and home management activities. Lymphedema limits her ability to participate in leisure pursuits and productive activities, and it negatively impacts body image, life roles and quality of life.   Pt will benefit from an Intensive and follow along course of lymphedema. CDT will consist of Manual Lymphatic gainage   (MLD), skin care, therapeutic exercise and compression, wraps initially then garments. Pt will need assistance throughout CDT   for applying compression wraps due to limited hip AROM and decreased skin flexibility. Without skilled Occupational Therapy for lymphedema care, lymphedema will progress and  further functional decline is expected.   In prep for upcoming hip replacement OT will educate Pt re assistive devices, including tub transfer bench and elevated toilet seat, in keeping with hip precautions.  OBJECTIVE IMPAIRMENTS: Abnormal gait, decreased activity tolerance, decreased balance, decreased knowledge of use of DME, decreased mobility, difficulty walking, decreased ROM, decreased strength, increased edema, decreased skin flexibility, increased fascial restrictions, impaired sensation, pain, and chronic, progressive LLE/LLQ lymphatic swelling and associated pain.   ADL LIMITATIONS: carrying, lifting, bending, sitting, standing, squatting, sleeping, stairs, transfers, bed mobility, bathing, dressing, hygiene/grooming, and productive activities, leisure pursuits, social participation, body image  driving, shopping, housework, yard work, cooking, meal prep  PERSONAL FACTORS: Past/current experiences and 3+ comorbidities: Morphea Scleroderma, Mixed connective tissue disease, and osteoarthritis  are also affecting patient's functional outcome.   REHAB POTENTIAL: Good  EVALUATION COMPLEXITY: Moderate  GOALS: Goals reviewed with patient? Yes  SHORT TERM GOALS: Target date: 4th OT Rx visit   Pt will demonstrate understanding of lymphedema precautions and prevention strategies with modified independence using a printed reference to identify at least 5 precautions and discussing how s/he may implement them into daily life to reduce risk of progression with extra time.  Baseline:Max A Goal status: GOAL MET  2.  Pt will be able to apply multilayer, knee length, gradient, compression wraps to one leg at a time with modified assistance (extra time and assistive device/s) to decrease limb volume, to limit infection risk, and to limit lymphedema progression.  Baseline: Dependent Goal status: GOAL MET  LONG TERM GOALS: Target date: 01/09/24 (12 weeks)  Given this patient's Intake score 36% on  the functional outcomes FOTO tool, patient will experience an increase in function of 3 points to improve basic and instrumental ADLs performance, including lymphedema self-care.  Baseline: Max A Goal status: DEFERRED as [employer] has been discontinued  2.  Given this patient's Intake score of  75% on the Lymphedema Life Impact Scale (LLIS), patient will experience a reduction of at least 5 points in her perceived level of functional impairment resulting from lymphedema to improve functional performance and quality of life (QOL). Baseline: % Goal status: PROGRESSING  3.  Pt will achieve at least a 10% volume reduction in full, RLE limb volume to return limb to typical size and shape, to limit infection risk and LE progression, to decrease pain, to improve function. Baseline: Dependent Goal status:PROGRESSING.  Volumetrics deferred until next week to focus on manual therapy and relief of pain/discomfort. To date by visual assessment I would estimate no more than a 10% volume reduction. DENSITY IN THIGH HAS REDUCED only SLIGHTLY. Morphea at  R groin continues to present a significant obstacle to limb volume decongestion and fibrosis reduction.  Progress is slow. Lymphedema management  with combination of progressive, secondary lymphedema with connective tissue disease is very stubborn and challenging.   4.  Pt will obtain proper compression garments/devices and achieve modified independence (extra time + assistive devices) with donning/doffing to optimize limb volume reductions and limit LE  progression over time. Baseline:  Goal status: 01/04/24: PARTIALLY MET; In an effort to reduce burden of care on her spouse, Pt obtained adjustable, Velcro style R full leg, Circaid leg reduction kit for use s/p THA, but this proved not effective for controlling swelling, and were not easier to don and doff more independently. We've resumed wrapping until she is able to fit back into custom compression garments. 02/14/24:  Ongoing  5.  During Intensive phase CDT , with modified independence, Pt will achieve at least 85% compliance with all lymphedema self-care home program components, including daily skin care, compression wraps and /or garments, simple self MLD and lymphatic pumping therex to habituate LE self care protocol  into ADLs for optimal LE self-management over time. Baseline: Dependent Goal status: 02/14/24: GOAL MET and EXCEEDED PLAN:  OT FREQUENCY: 2x/week  OT DURATION: 12 weeks and PRN  PLANNED INTERVENTIONS: Complete Decongestive Therapy (Intensive and supported Self-Management Phases), 97110-Therapeutic exercises, 97530- Therapeutic activity, 97535- Self Care, 16109- Manual therapy, Patient/Family education, Taping, Manual lymph drainage, Scar mobilization, Compression bandaging, DME instructions, and skin care to reduce infection risk throughout manual therapy. Fit with replacement compression garments ASAP  PLAN FOR NEXT SESSION:  Cont RLE/RLQ MLD Cont multilayer compression wrapping Manufacturer's rep here to assist with custom garment measuring. Consider multi-pieces- bike shorts over thigh high Capri and knee high  Arnold Bicker, MS, OTR/L, CLT-LANA 04/05/24 12:49 PM

## 2024-04-06 ENCOUNTER — Encounter: Admitting: Occupational Therapy

## 2024-04-09 ENCOUNTER — Ambulatory Visit

## 2024-04-09 ENCOUNTER — Ambulatory Visit: Admitting: Occupational Therapy

## 2024-04-09 DIAGNOSIS — M6281 Muscle weakness (generalized): Secondary | ICD-10-CM

## 2024-04-09 DIAGNOSIS — R262 Difficulty in walking, not elsewhere classified: Secondary | ICD-10-CM

## 2024-04-09 DIAGNOSIS — M25651 Stiffness of right hip, not elsewhere classified: Secondary | ICD-10-CM

## 2024-04-09 DIAGNOSIS — I890 Lymphedema, not elsewhere classified: Secondary | ICD-10-CM | POA: Diagnosis not present

## 2024-04-09 DIAGNOSIS — M25551 Pain in right hip: Secondary | ICD-10-CM

## 2024-04-09 NOTE — Therapy (Signed)
 OUTPATIENT PHYSICAL THERAPY TREATMENT    Patient Name: Elizabeth Martin MRN: [id number] DOB:1961-03-28, 63 y.o., female Today's Date: 04/09/2024  END OF SESSION:  PT End of Session - 04/09/24 1030     Visit Number 18    Number of Visits 35    Date for PT Re-Evaluation 06/04/24    Authorization Type [employer]IC[employer]    Progress Note Due on Visit 20    PT Start Time 1019    PT Stop Time 1055    PT Time Calculation (min) 36 min    Activity Tolerance Patient limited by pain;Patient limited by fatigue    Behavior During Therapy [facility] for tasks assessed/performed                   Past Medical History:  Diagnosis Date   Anxiety    Asthma    Autoimmune disease (HCC)    Cancer (HCC)    Endometrial lymph node removal (30)   Corneal abrasion, right 05/24/2022   Depression    GERD (gastroesophageal reflux disease)    H/O blood clots    upper rt groin and behind both knees   History of hiatal hernia    Hypertension    Lymphedema    right leg   Morphea scleroderma    PONV (postoperative nausea and vomiting)    states gets violently ill   Past Surgical History:  Procedure Laterality Date   BASAL CELL CARCINOMA EXCISION Left    CHOLECYSTECTOMY  2008   FLEXIBLE SIGMOIDOSCOPY N/A 05/19/2022   Procedure: FLEXIBLE SIGMOIDOSCOPY;  Surgeon: Toledo, Alphonsus Jeans, MD;  Location: [facility] ENDOSCOPY;  Service: Gastroenterology;  Laterality: N/A;   HERNIA REPAIR  2004   ILEOSTOMY CLOSURE N/A 08/24/2022   Procedure: ILEOSTOMY TAKEDOWN, open loop with Apolonio Bay, PA-C to assist;  Surgeon: Emmalene Hare, MD;  Location: [facility] ORS;  Service: General;  Laterality: N/A;   IVC FILTER INSERTION N/A 11/09/2023   Procedure: IVC FILTER INSERTION;  Surgeon: Celso College, MD;  Location: [facility] INVASIVE CV LAB;  Service: Cardiovascular;  Laterality: N/A;   IVC FILTER REMOVAL N/A 01/02/2024   Procedure: IVC FILTER REMOVAL;  Surgeon: Celso College, MD;  Location: [facility] INVASIVE CV LAB;  Service: Cardiovascular;   Laterality: N/A;   PLANTAR FASCIECTOMY     ROBOTIC ASSISTED TOTAL HYSTERECTOMY WITH BILATERAL SALPINGO OOPHERECTOMY  02/14/2012   ROTATOR CUFF REPAIR Left 03/24/2022   TONSILLECTOMY     TOTAL HIP ARTHROPLASTY Right 11/14/2023   Procedure: TOTAL HIP ARTHROPLASTY - posterior;  Surgeon: Venus Ginsberg, MD;  Location: [facility] ORS;  Service: Orthopedics;  Laterality: Right;   XI ROBOTIC ASSISTED LOWER ANTERIOR RESECTION N/A 05/24/2022   Procedure: XI ROBOTIC ASSISTED LOWER ANTERIOR RESECTION;  Surgeon: Emmalene Hare, MD;  Location: [facility] ORS;  Service: General;  Laterality: N/A;   Patient Active Problem List   Diagnosis Date Noted   Constipation 01/10/2024   Nausea & vomiting 01/10/2024   S/P total right hip arthroplasty 11/14/2023   Blood coagulation disorder (HCC) 11/10/2023   PTSD (post-traumatic stress disorder) 11/10/2023   Severe episode of recurrent major depressive disorder, without psychotic features (HCC) 11/10/2023   GAD (generalized anxiety disorder) 11/10/2023   High risk medication use 11/10/2023   Anxiety and depression 03/22/2023   Cervical radiculopathy 03/16/2023   Colon cancer screening 03/15/2023   Chronic idiopathic constipation 03/15/2023   Dyspnea 03/07/2023   [facility] spotted fever 03/07/2023   Strain of gastrocnemius tendon 03/07/2023   Lower abdominal pain 03/07/2023  Pain in pelvis 03/07/2023   Small bowel obstruction (HCC) 02/11/2023   Raynaud disease 02/11/2023   Polyarthralgia 02/11/2023   Edema 01/03/2023   Diverticular disease 12/10/2022   Hot flashes 12/10/2022   Pain of right lower extremity 12/10/2022   Paresthesia of lower extremity 12/10/2022   Primary insomnia 12/10/2022   Thyroid  nodule 12/10/2022   Multiple joint pain 12/10/2022   Basal cell carcinoma of skin 12/10/2022   Acute bilateral low back pain without sciatica 11/10/2022   Chronic lower back pain 09/05/2022   S/P closure of ileostomy 08/24/2022   Ileostomy in place [facility])     Morphea 07/13/2022   Colonic stricture (HCC) 05/19/2022   Large bowel obstruction (HCC) 05/19/2022   Hypokalemia 05/18/2022   Abdominal pain 05/18/2022   Osteoarthritis of left knee 02/24/2022   Mixed connective tissue disease (HCC) 02/17/2022   Adhesive capsulitis of left shoulder 01/27/2022   Glenohumeral arthritis, left 01/27/2022   Microscopic hematuria 01/12/2022   Otorrhagia of right ear 12/10/2021   Elevated testosterone level in female 11/04/2021   Anti-TPO antibodies present 10/20/2021   Rash 10/13/2021   Hashimoto thyroiditis, 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   Hashimoto's disease 03/23/2021   Exposure to severe acute respiratory syndrome coronavirus 2 (SARS-CoV-2) 12/19/2020   Migraine without aura and without status migrainosus, not intractable 08/19/2020   Myofascial pain 07/30/2020   Neck pain 07/30/2020   Upper back pain 07/30/2020   Cervical facet joint syndrome 07/30/2020   Hair loss 07/14/2020   Edema of lower extremity 07/11/2020   Adenomatous colon polyp 02/26/2020   Gastroesophageal reflux disease 02/15/2020   Diarrhea 02/15/2020   Sleep disorder 01/16/2020   Hyperlipidemia 07/11/2019   Right shoulder pain 04/04/2019   Pre-operative clearance 12/13/2018   Endometrial cancer (HCC) 04/05/2018   Patellofemoral pain syndrome of right knee 03/20/2018   Gastroenteritis 12/07/2017   Fatigue 11/16/2017   Morbid obesity (HCC) 08/11/2017   Stucco keratoses 08/11/2017   Essential hypertension 08/09/2017   History of endometrial cancer 08/09/2017   Lymphedema 08/09/2017   Menopause 08/09/2017   Malignant neoplastic disease (HCC) 04/07/2017   Cellulitis 09/18/2016   Benign hypertension 06/02/2016   Asthma 06/04/1969    PCP: Eather Golder MD   REFERRING PROVIDER: Eather Golder MD   REFERRING DIAG: s/p R hip replacement  THERAPY DIAG:  Muscle weakness (generalized)  Stiffness of right hip, not elsewhere classified  Pain in right hip  Difficulty in  walking, not elsewhere classified  Rationale for Evaluation and Treatment: Rehabilitation  ONSET DATE: 11/14/23  SUBJECTIVE:   SUBJECTIVE STATEMENT:   Pt reports a little pain in hip joint, but says worst pain is tendons and mm around it and rates it 3/10. She reports no stumbles/falls.   PERTINENT HISTORY: s/p R hip arthropaslasty 11/14/23 with Dr. Clyda Dark posterior approach, blood coagulation disorder, PTSD, anxiety, cervical radiculopathy dypsnea, polyarthralgia, Raynaud disease, s/p closure of ileostomy, Hashimoto thyroid , endometrial cancer, menopause, endometrial cancer, lymphedema. Patient had home health PT prior to PT here.   PAIN:  Are you having pain?  2/10 Rt posterior deep hip and medial posterior gluteals   PRECAUTIONS: Other: recovering from hip sx  RED FLAGS: None   WEIGHT BEARING RESTRICTIONS: No  FALLS:  Has patient fallen in last 6 months? Yes. Number of falls 2  LIVING ENVIRONMENT: Lives with: lives with their spouse Lives in: House/apartment Stairs: No Has following equipment at home: Single point cane  OCCUPATION: retired,   PLOF: Independent  PATIENT GOALS: return to hiking, working out,  gardening.   NEXT MD VISIT: March 2025   OBJECTIVE:  Note: Objective measures were completed at Evaluation unless otherwise noted.  DIAGNOSTIC FINDINGS:  IMPRESSION: "Right hip replacement.  No visible complicating feature."  PATIENT SURVEYS:  LEFS 5/80  COGNITION: Overall cognitive status: Within functional limits for tasks assessed    MUSCLE LENGTH: Hamstrings: limited bilaterally with R>L   LOWER EXTREMITY ROM:  Passive ROM Right  04/02/24 Right eval Left eval  Hip flexion >90 degrees 48 flexion PROM supine   Hip extension 0 degrees    Hip abduction ~25 degrees 5 degrees AROM     (Blank rows = not tested)  LOWER EXTREMITY MMT:  MMT Right 04/02/24 Right eval Left eval  Hip flexion 4/5 2+ 5  Hip extension (supine 30 deg) 2-/5   5  Hip Hor  ADD 5/5    Hip abduction 2+/5 2+*  5  Hip Hor ABDCT 4+/5    Hip adduction  2+ 5  Hip internal rotation 4+/5    Hip external rotation 3-/5     Knee flexion 5/5 3 5   Knee extension 4+/5 3 5   Ankle dorsiflexion 5/5 3 5   Ankle plantarflexion 5/5 3 5    (Blank rows = not tested)   FUNCTIONAL TESTS:  5 times sit to stand: 29 6 minute walk test:  10 meter walk test: 15 seconds with SPC   GAIT: Distance walked: 60 ft Assistive device utilized: Single point cane Level of assistance: CGA Comments: antalgic gait pattern with decreased stance time RLE. Slight vaulting pattern.  Stair negotiation: -step to pattern lead with LLE, one hand on cane one hand on rail ascending. Descending step to pattern down with RLE.                                                                                                                               TREATMENT DATE:  04/09/24  Reprinted and provided pt with her HEP per pt request    Therapeutic Activities: dynamic therapeutic activities  designed to achieve improved functional performance   --Sit to stand with LLE on airex pad to emphasize RLE Weight bearing. 10x, 3x  -- pt reports somewhat challenging.   - Standing Hip ext + (gluteal squeeze at endpoint) 2 x 10 reps   -Standing ham curls with BUE Support x 15, 5x reps  - Standing calf raises with BUE Support 1 set of 10 normal   -hooklying bridge 2x12 (no pain)   -RLE hip SLR +Hip ABD/ADD (lifting LE up/over balance pod - cone too challenging today)  x10 in supine   - RLE (supine/hooklye) - knee fallout and back to neutral 2x10   -RLE hip hooklying marching 1x10, 1x3 - limited by fatigue   Standing hip abduction side steps at support surface x multiple reps   Pt reports pain eases off with interventions, but interventions still fatiguing      PATIENT EDUCATION:  Education details: exercise  technique Person educated: Patient Education method: Explanation, Demonstration, Tactile  cues, and Verbal cues Education comprehension: verbalized understanding, returned demonstration, verbal cues required, tactile cues required, and needs further education  HOME EXERCISE PROGRAM: Access Code: [facility] URL: [email or web address] Date: 02/08/2024; updated 03/21/2024 Prepared by: [employer]  Exercises - Side Stepping with Counter Support  - 1 x daily - 7 x weekly - 2 sets - 10 reps - 5 hold - Standing Hip Extension  - 1 x daily - 7 x weekly - 2 sets - 10 reps - 5 hold - Standing March with Counter Support  - 1 x daily - 7 x weekly - 2 sets - 10 reps - 5 hold - Standing Hip Abduction with Counter Support  - 1 x daily - 7 x weekly - 2 sets - 10 reps - Seated Hamstring Stretch  - 1 x daily - 7 x weekly - 2 sets - 2 reps - 30 hold - Sit to Stand  - 1 x daily - 7 x weekly - 2 sets - 10 reps - 5 hold - Supine Heel Slide  - 1 x daily - 7 x weekly - 2 sets - 6 reps - Supine Gluteal Sets  - 1 x daily - 7 x weekly - 2 sets - 10 reps - Supine Bridge  - 1 x daily - 7 x weekly - 3 sets - 5 reps - 2-3sec hold - Supine Short Arc Quad  - 1 x daily - 7 x weekly - 3 sets - 15 reps - Small Range Straight Leg Raise  - 1 x daily - 7 x weekly - 3 sets - 5 reps - Hooklying Clamshell with Resistance  - 1 x daily - 7 x weekly - 3 sets - 15 reps - Prone Hip Extension with Bent Knee  - 1 x daily - 7 x weekly - 3 sets - 10 reps - Prone Hip Extension Sequence  - 1 x daily - 7 x weekly - 3 sets - 10 reps  ASSESSMENT: CLINICAL IMPRESSION:   Pt presents with excellent motivation to participate in PT, but is generally more fatigued in RLE today. Exercises modified and some regressed as a result. Pt will continue to benefit from skilled therapy to address remaining deficits in order to improve overall QoL and return to PLOF.      OBJECTIVE IMPAIRMENTS: Abnormal gait, decreased activity tolerance, decreased balance, decreased coordination, decreased endurance, decreased  mobility, difficulty walking, decreased ROM, decreased strength, hypomobility, increased edema, impaired perceived functional ability, impaired flexibility, improper body mechanics, postural dysfunction, and pain.   ACTIVITY LIMITATIONS: carrying, lifting, bending, sitting, standing, squatting, sleeping, stairs, transfers, bed mobility, bathing, toileting, dressing, reach over head, hygiene/grooming, locomotion level, and caring for others  PARTICIPATION LIMITATIONS: meal prep, cleaning, laundry, interpersonal relationship, driving, shopping, community activity, and yard work  PERSONAL FACTORS: Age, Past/current experiences, Time since onset of injury/illness/exacerbation, and 3+ comorbidities: blood coagulation disorder, PTSD, anxiety, cervical radiculopathy dypsnea, polyarthralgia, Raynaud disease, s/p closure of ileostomy, Hashimoto thyroid , endometrial cancer, menopause, endometrial cancer, lymphedema  are also affecting patient's functional outcome.   REHAB POTENTIAL: Good  CLINICAL DECISION MAKING: Evolving/moderate complexity  EVALUATION COMPLEXITY: Moderate   GOALS: Goals reviewed with patient? Yes  SHORT TERM GOALS: Target date: 01/19/2024  Patient will be independent in home exercise program to improve strength/mobility for better functional independence with ADLs. Baseline:1/30: HEP given 3/12: pt reports completing 3-4 days a week depending on soreness.  Goal status:  MET  LONG TERM GOALS: Target date: 06/04/2024  Patient (> 55 years old) will complete five times sit to stand test in < 10 seconds indicating an increased LE strength and improved balance. Baseline: 1/30: 29 seconds 3/12: 19.41 03/12/2024= 17.44 seconds- without UE support Goal status: PROGRESSING  2.  Patient will increase six minute walk test distance to >1000 for progression to community ambulator and improve gait ability Baseline: 430 ft in 5 min and required seated rest to end test due to fatigue, uses  SPC 883ft with SPC and no standing or seated rest break; 03/12/2024= 950 feet with SPC Goal status: in progress   3.  Patient will increase 10 meter walk test to >1.79m/s as to improve gait speed for better community ambulation and to reduce fall risk. Baseline: 1/30: 15 seconds with SPC  Without SPC: 10.63 and 10.38 speed: 0.95 m/s; with SPC 10.98 and 11.18; 03/12/2024= 1.0 m/s  Goal status: PROGRESSING  4.  Patient will increase lower extremity functional scale to >60/80 to demonstrate improved functional mobility and increased tolerance with ADLs.  Baseline: 1/30: 5/80 3/12: 20/80; 03/12/2024= 29/80 Goal status: Progressing  PLAN:  PT FREQUENCY: 1-2x/week PT DURATION: 12 weeks PLANNED INTERVENTIONS: 97164- PT Re-evaluation, 97110-Therapeutic exercises, 97530- Therapeutic activity, 97112- Neuromuscular re-education, 97535- Self Care, 78295- Manual therapy, 97116- Gait training, 97760- Splinting, 97014- Electrical stimulation (unattended), [id number]- Electrical stimulation (manual), [id number]- Ultrasound, 62130- Traction (mechanical), Patient/Family education, Balance training, Stair training, Taping, Joint mobilization, Joint manipulation, Spinal manipulation, Spinal mobilization, Manual lymph drainage, Scar mobilization, Compression bandaging, Vestibular training, Visual/preceptual remediation/compensation, DME instructions, Cryotherapy, and Moist heat  PLAN FOR NEXT SESSION:   Continue with focal strength interventions, consider updating HEP to more simplified exercises.   10:57 AM, 04/09/24 Ossie Blend, PT Physical Therapist - [facility]  [phone] (AS[facility])   04/09/24, 10:57 AM

## 2024-04-09 NOTE — Therapy (Signed)
 OUTPATIENT OCCUPATIONAL THERAPY TREATMENT NOTE  LOWER EXTREMITY LYMPHEDEMA  Patient Name: Elizabeth Martin MRN: [id number] DOB:Jan 09, 1961, 63 y.o., female Today's Date: 04/09/2024  REPORTING PERIOD:   END OF SESSION:  Lymphedema Episode 2   OT End of Session - 04/09/24 1110     Visit Number 29    Number of Visits 36    Date for OT Re-Evaluation 04/16/24    OT Start Time 1105    OT Stop Time 1205    OT Time Calculation (min) 60 min    Activity Tolerance Patient tolerated treatment well;No increased pain    Behavior During Therapy WFL for tasks assessed/performed              Past Medical History:  Diagnosis Date   Anxiety    Asthma    Autoimmune disease (HCC)    Cancer (HCC)    Endometrial lymph node removal (30)   Corneal abrasion, right 05/24/2022   Depression    GERD (gastroesophageal reflux disease)    H/O blood clots    upper rt groin and behind both knees   History of hiatal hernia    Hypertension    Lymphedema    right leg   Morphea scleroderma    PONV (postoperative nausea and vomiting)    states gets violently ill   Past Surgical History:  Procedure Laterality Date   BASAL CELL CARCINOMA EXCISION Left    CHOLECYSTECTOMY  2008   FLEXIBLE SIGMOIDOSCOPY N/A 05/19/2022   Procedure: FLEXIBLE SIGMOIDOSCOPY;  Surgeon: Toledo, Alphonsus Jeans, MD;  Location: [facility];  Service: Gastroenterology;  Laterality: N/A;   HERNIA REPAIR  2004   ILEOSTOMY CLOSURE N/A 08/24/2022   Procedure: ILEOSTOMY TAKEDOWN, open loop with Apolonio Bay, PA-C to assist;  Surgeon: Emmalene Hare, MD;  Location: [facility];  Service: General;  Laterality: N/A;   IVC FILTER INSERTION N/A 11/09/2023   Procedure: IVC FILTER INSERTION;  Surgeon: Celso College, MD;  Location: [facility];  Service: Cardiovascular;  Laterality: N/A;   IVC FILTER REMOVAL N/A 01/02/2024   Procedure: IVC FILTER REMOVAL;  Surgeon: Celso College, MD;  Location: [facility];  Service:  Cardiovascular;  Laterality: N/A;   PLANTAR FASCIECTOMY     ROBOTIC ASSISTED TOTAL HYSTERECTOMY WITH BILATERAL SALPINGO OOPHERECTOMY  02/14/2012   ROTATOR CUFF REPAIR Left 03/24/2022   TONSILLECTOMY     TOTAL HIP ARTHROPLASTY Right 11/14/2023   Procedure: TOTAL HIP ARTHROPLASTY - posterior;  Surgeon: Venus Ginsberg, MD;  Location: [facility];  Service: Orthopedics;  Laterality: Right;   XI ROBOTIC ASSISTED LOWER ANTERIOR RESECTION N/A 05/24/2022   Procedure: XI ROBOTIC ASSISTED LOWER ANTERIOR RESECTION;  Surgeon: Emmalene Hare, MD;  Location: [facility];  Service: General;  Laterality: N/A;   Patient Active Problem List   Diagnosis Date Noted   Constipation 01/10/2024   Nausea & vomiting 01/10/2024   S/P total right hip arthroplasty 11/14/2023   Blood coagulation disorder (HCC) 11/10/2023   PTSD (post-traumatic stress disorder) 11/10/2023   Severe episode of recurrent major depressive disorder, without psychotic features (HCC) 11/10/2023   GAD (generalized anxiety disorder) 11/10/2023   High risk medication use 11/10/2023   Anxiety and depression 03/22/2023   Cervical radiculopathy 03/16/2023   Colon cancer screening 03/15/2023   Chronic idiopathic constipation 03/15/2023   Dyspnea 03/07/2023   [facility] spotted fever 03/07/2023   Strain of gastrocnemius tendon 03/07/2023   Lower abdominal pain 03/07/2023   Pain in pelvis 03/07/2023   Small bowel  obstruction (HCC) 02/11/2023   Raynaud disease 02/11/2023   Polyarthralgia 02/11/2023   Edema 01/03/2023   Diverticular disease 12/10/2022   Hot flashes 12/10/2022   Pain of right lower extremity 12/10/2022   Paresthesia of lower extremity 12/10/2022   Primary insomnia 12/10/2022   Thyroid  nodule 12/10/2022   Multiple joint pain 12/10/2022   Basal cell carcinoma of skin 12/10/2022   Acute bilateral low back pain without sciatica 11/10/2022   Chronic lower back pain 09/05/2022   S/P closure of ileostomy 08/24/2022   Ileostomy in  place [facility])    Morphea 07/13/2022   Colonic stricture (HCC) 05/19/2022   Large bowel obstruction (HCC) 05/19/2022   Hypokalemia 05/18/2022   Abdominal pain 05/18/2022   Osteoarthritis of left knee 02/24/2022   Mixed connective tissue disease (HCC) 02/17/2022   Adhesive capsulitis of left shoulder 01/27/2022   Glenohumeral arthritis, left 01/27/2022   Microscopic hematuria 01/12/2022   Otorrhagia of right ear 12/10/2021   Elevated testosterone level in female 11/04/2021   Anti-TPO antibodies present 10/20/2021   Rash 10/13/2021   Hashimoto thyroiditis, 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   Hashimoto's disease 03/23/2021   Exposure to severe acute respiratory syndrome coronavirus 2 (SARS-CoV-2) 12/19/2020   Migraine without aura and without status migrainosus, not intractable 08/19/2020   Myofascial pain 07/30/2020   Neck pain 07/30/2020   Upper back pain 07/30/2020   Cervical facet joint syndrome 07/30/2020   Hair loss 07/14/2020   Edema of lower extremity 07/11/2020   Adenomatous colon polyp 02/26/2020   Gastroesophageal reflux disease 02/15/2020   Diarrhea 02/15/2020   Sleep disorder 01/16/2020   Hyperlipidemia 07/11/2019   Right shoulder pain 04/04/2019   Pre-operative clearance 12/13/2018   Endometrial cancer (HCC) 04/05/2018   Patellofemoral pain syndrome of right knee 03/20/2018   Gastroenteritis 12/07/2017   Fatigue 11/16/2017   Morbid obesity (HCC) 08/11/2017   Stucco keratoses 08/11/2017   Essential hypertension 08/09/2017   History of endometrial cancer 08/09/2017   Lymphedema 08/09/2017   Menopause 08/09/2017   Malignant neoplastic disease (HCC) 04/07/2017   Cellulitis 09/18/2016   Benign hypertension 06/02/2016   Asthma 06/04/1969    PCP: Eather Golder, MD  REFERRING PROVIDER: Eather Golder, MD  REFERRING DIAG: I89.0   THERAPY DIAG:  Lymphedema, not elsewhere classified  Rationale for Evaluation and Treatment: Rehabilitation  ONSET DATE: 2013   (Cancer-related, endometrial 2013)  SUBJECTIVE:                                                                                                                                                                                           SUBJECTIVE STATEMENT:Patient returns to OT for lymphedema  care to RLE/RLQ.  Pt does not rate LE-related RLE/RLQ pain numerically. Pt reports that her insurance company will not cover custom compression garments we measured for at last session. DME vendor is checking in [employer]'s contribution , if any. Pt is very disappointed today. She is in pain, but does not rate it numerically.   PERTINENT HISTORY: Asthma, Autoimmune Disease (Connective Tissue Disease), Endometrial Ca w/ LND ( 30 bilateral pelvic and periaortic LN), adjuvant chemotherapy and XRT,   H/O blood clots, RLE/RLQ lymphedema, Robotic assisted total hysterectomy w/ bilateral oophorectomy L Rotator Cuff repair, S/P ileostomy 2/2 colon stricture 05/2022; s/p R hip arthroplasty 11/11/23.   PAIN:  Are you having pain? YES. See subjective; not rated /10 Pain location: RLE, R hip, cervical spine Pain description: sore, aching, heavy, full, tight,  Aggravating factors: standing, walking, extended dependent sitting Relieving factors: elevation, movement  PRECAUTIONS: Fall and Other: LYMPHEDEMA  WEIGHT BEARING RESTRICTIONS: Yes 5# lifting restriction- shoulder  FALLS:  Has patient fallen in last 6 months? No  LIVING ENVIRONMENT: Lives with: lives with their spouse Lives in: House/apartment Stairs: No;  Has following equipment at home: Single point cane, Environmental consultant - 2 wheeled, Environmental consultant - 4 wheeled, and Wheelchair (manual)  OCCUPATION: retired Control and instrumentation engineer: gardening, hiking, biking, dancing- unable to participate in any of these due to pain and swelling  HAND DOMINANCE: right   PRIOR LEVEL OF FUNCTION: Independent with basic ADLs, Independent with household mobility without device, Independent with  community mobility without device, Requires assistive device for independence, Needs assistance with homemaking, Needs assistance with transfers, and Leisure: decreased  social participation for leisure pursuits due to impaired mobility and pain  PATIENT GOALS:  Be able to move freely without pain Reduce limb volume to be able to lift limb for basic daily ADLs and functional ambulation Reduce limb volume to increase ability to perform lower body dressing and bathing and grooming Reduce limb to increase body image  OBJECTIVE: Note: Objective measures were completed at Evaluation unless otherwise noted.  COGNITION:  Overall cognitive status: Within functional limits for tasks assessed   OBSERVATIONS / OTHER ASSESSMENTS:   POSTURE: WFL  LE ROM: WFL, but Limited mildly at R knee and ankle due to girth, skin approximation, and joint pain  LE MMT: WFL  Mild, Stage  II, Bilateral Lower Extremity Lymphedema 2/2 CVI and Obesity  Skin  Description Hyper-Keratosis Peau d' Orange Shiny Tight Fibrotic/ Indurated Fatty Doughy Spongy/ boggy   x x x x R>L Severe morphea at  R groin   x   Skin dry Flaky WNL Macerated   mildly sclerotic     Color Redness Varicosities Blanching Hemosiderin Stain Mottled   x x x   x   Odor Malodorous Yeast Fungal infection  WNL      x   Temperature Warm Cool wnl    x     Pitting Edema   1+ 2+ 3+ 4+ Non-pitting         x   Girth Symmetrical Asymmetrical                   Distribution    R>L RLE toes to groin    Stemmer Sign Positive Negative   +    Lymphorrhea History Of:  Present Absent     x    Wounds History Of Present Absent Venous Arterial Pressure Sheer     x        Signs of Infection Redness Warmth Erythema  Acute Swelling Drainage Borders                    Sensation Light Touch Deep pressure Hypersensitivity   In tact Impaired In tact Impaired Absent Impaired   x  x  x     Nails WNL   Fungus nail dystrophy   x      Hair Growth Symmetrical Asymmetrical    R>L   Skin Creases Base of toes  Ankles   Base of Fingers knees       Abdominal pannus Thigh Lobules  Face/neck   x x  x        BLE COMPARATIVE LIMB VOLUMETRICS 10/10/23  LANDMARK RIGHT  10/10/23  R LEG (A-D) 5466.5 ml  R THIGH (E-G) 9186.6 ml  R FULL LIMB (A-G) 14653.1 ml  Limb Volume differential (LVD)  LVD for LEG = 44.1%, R>L LVD for THIGH= 33.98%, R>L LVD for FULL lower extremity 37.8%, R>L  Volume change since last 08/11/22 R LEG is INCREASED in volume by 59.6%. L THIGH is INCREASED by 38 %, and RLE full limb is INCREASED by 45.38%.  Volume change overall V  (Blank rows = not tested)  LANDMARK LEFT  10/12/23  L LEG (A-D) 3053.6 ml  L THIGH (E-G) 6064.8 ml  L  FULL LIMB (A-G) 9118.4 ml  Limb Volume differential (LVD)  %  Volume change since initial %  Volume change overall %  (Blank rows = not tested)   10 th VISIT PROGRESS NOTE RLE COMPARATIVE LIMB VOLUMETRICS 01/04/24: Deferred until next visit by Pt request.  LANDMARK RIGHT    R LEG (A-D) TBA  R THIGH (E-G) TBA  R FULL LIMB (A-G) TBA  Limb Volume differential (LVD)    Volume change since last 08/11/22 TBA  Volume change overall TBA  (Blank rows = not tested)  20 th VISIT PROGRESS NOTE RLE COMPARATIVE LIMB VOLUMETRICS TBA next session  LANDMARK RIGHT    R LEG (A-D) 6005.7 ml  R THIGH (E-G) 9400.3 ml  R FULL LIMB (A-G) 15406.02 ml  Limb Volume differential (LVD)    Volume change since last measured on 10/10/23 R LEG is INCREASED in volume by 9.9%. L THIGH is INCREASED by 2.32 %, and RLE full limb is INCREASED by 5.13% since last measured on 10/10/23.  Volume change since commencing OT for CDT %    28 th VISIT PROGRESS NOTE RLE COMPARATIVE LIMB VOLUMETRICS TBA next session  LANDMARK RIGHT    R LEG (A-D) 5936.1 ml  R THIGH (E-G) 8538.6  ml  R FULL LIMB (A-G) 14524.7  ml  Limb Volume differential (LVD)    Volume change since last measured on 10/10/23 R LEG is DECREASED  in volume by 1.2 %.  Since last measured on 03/14/24. R THIGH is DECREASED by 8.6 %, and RLE full limb is DECREASED by 5.72 % since last measured on 03/14/24.  Volume change since commencing OT for CDT %   ENDOMETRIAL CA-related LYMPHEDEMA Hx:  SURGERY TYPE/DATE: 2013 NUMBER OF LYMPH NODES REMOVED: 30 by report- bilateral pelvic and periaortic CHEMOTHERAPY: yes RADIATION:yes INFECTIONS: no hx cellulitis GAIT: Distance walked: >500 ft Assistive device utilized: single point cane Level of assistance: Modified independence- extra time Comments: R limp  LYMPHEDEMA LIFE IMPACT SCALE (LLIS): Intake 10/12/23 75% (The extent to which lymphedema related problems impacted your life over the past week)  FOTO (functional outcome measure):10/10/23 INTAKE: 36% No Out take score. FOTO assessment discontinued by clinic.  PATIENT EDUCATION:  Education details: Continued Pt/ CG edu for lymphedema self care home program throughout session. Topics include outcome of comparative limb volumetrics- starting limb volume differentials (LVDs), technology and gradient techniques used for short stretch, multilayer compression wrapping, simple self-MLD, therapeutic lymphatic pumping exercises, skin/nail care, LE precautions,. compression garment recommendations and specifications, wear and care schedule and compression garment donning / doffing w assistive devices. Discussed progress towards all OT goals since commencing CDT. All questions answered to the Pt's satisfaction. Good return. Person educated: Patient Education method: Explanation, Demonstration, and Handouts Education comprehension: verbalized understanding, returned demonstration, and needs further education  LE SELF-CARE HOME  PROGRAM: Simple self-MLD/daily to affected quadrant and body part At least 2 x daily BLE Lymphatic Pumping There ex 1 set of 10 reps each, in order, bilaterally Daily skin care to affected body part to limit infection risk and increase  skin excursion Compression Bandaging Intensive stage compression: multilayer short stretch wraps with gradient techniques. One limb at a time. Length patient dependent. Self-management Phase: Appropriate daytime compression garment and hours-of-sleep device  Compression garments: Custom-made gradient compression garments and hours-of-sleep devices are medically necessary because they are uniquely sized and shaped to fit the exact dimensions of the affected extremities and to provide accurate and consistent gradient compression and containment, essential  for optimally managing chronic, progressive lymphedema. The convoluted HOS devices are medically necessary to facilitate increased lymphatic circulation and limit fibrosis formation when sleeping. Multiple custom compression garments are needed for optimal hygiene to limit infection risk. Custom compression garments should be replaced q 3-6 months When worn consistently for optimal lipo-lymphedema self-management over time.  ASSESSMENT:  CLINICAL IMPRESSION:  Pt tolerated RLE/RLQ MLD in supine as established working proximally to distal from neck, deep abdominal pathways, functional axillary LN, and J strokes  from thigh to toes. Reapplied compression wraps. Cont CDT 3 x weekly as per POC.   Cont as per POC.  10/10/23 Lymphedema Episode 2, Initial OT Evaluation : Elizabeth Martin is a 9 y a female presenting with chronic, progressive, moderate, Stage II, RLE/RLQ cancer -related lymphedema with onset 11 years ago after Rx for endometrial cancer. Pt is well known to this therapist as she has successfully undergone Complete Decongestive Therapy (CDT)  this clinic bin the past. Pt reports recent exacerbation of RLE swelling worsened as inflammation in L hip has worsened over time.  Pt has a hip replacement scheduled in late December here it [facility]. RE limb volumetrics today reveal that R LEG volume is dramatically increased by 59.65 since last visit. R LEG  VOLUME is INCREASED in volume by 59.6%. L THIGH is INCREASED by 38 %, and RLE full limb is INCREASED by 45.38%. Pt presents with LE-related skin changes. Prolonged inflammation in the R hip joint has likely overloaded  already RLE/RLQ lymphatics.  RLE/RLQ lymphedema limits Pt's ability to perform basic and instrumental ADLs, including functional ambulation, mobility and transfers, grooming , lower body bathing and dressing, skin inspection and skin care. Pt has difficulty  reaching her lower legs and feet to apply compression wraps and don/doff        compression stockings. LE limits ability to P[perform instrumental ADLs, including driving, yard work and home management activities. Lymphedema limits her ability to participate in leisure pursuits and productive activities, and it negatively impacts body image, life roles and quality of life.   Pt will benefit from an Intensive and follow along course of lymphedema. CDT will consist of Manual Lymphatic gainage   (MLD),  skin care, therapeutic exercise and compression, wraps initially then garments. Pt will need assistance throughout CDT   for applying compression wraps due to limited hip AROM and decreased skin flexibility. Without skilled Occupational Therapy for lymphedema care, lymphedema will progress and further functional decline is expected.   In prep for upcoming hip replacement OT will educate Pt re assistive devices, including tub transfer bench and elevated toilet seat, in keeping with hip precautions.  OBJECTIVE IMPAIRMENTS: Abnormal gait, decreased activity tolerance, decreased balance, decreased knowledge of use of DME, decreased mobility, difficulty walking, decreased ROM, decreased strength, increased edema, decreased skin flexibility, increased fascial restrictions, impaired sensation, pain, and chronic, progressive LLE/LLQ lymphatic swelling and associated pain.   ADL LIMITATIONS: carrying, lifting, bending, sitting, standing, squatting,  sleeping, stairs, transfers, bed mobility, bathing, dressing, hygiene/grooming, and productive activities, leisure pursuits, social participation, body image  driving, shopping, housework, yard work, cooking, meal prep  PERSONAL FACTORS: Past/current experiences and 3+ comorbidities: Morphea Scleroderma, Mixed connective tissue disease, and osteoarthritis  are also affecting patient's functional outcome.   REHAB POTENTIAL: Good  EVALUATION COMPLEXITY: Moderate  GOALS: Goals reviewed with patient? Yes  SHORT TERM GOALS: Target date: 4th OT Rx visit   Pt will demonstrate understanding of lymphedema precautions and prevention strategies with modified independence using a printed reference to identify at least 5 precautions and discussing how s/he may implement them into daily life to reduce risk of progression with extra time.  Baseline:Max A Goal status: GOAL MET  2.  Pt will be able to apply multilayer, knee length, gradient, compression wraps to one leg at a time with modified assistance (extra time and assistive device/s) to decrease limb volume, to limit infection risk, and to limit lymphedema progression.  Baseline: Dependent Goal status: GOAL MET  LONG TERM GOALS: Target date: 01/09/24 (12 weeks)  Given this patient's Intake score 36% on the functional outcomes FOTO tool, patient will experience an increase in function of 3 points to improve basic and instrumental ADLs performance, including lymphedema self-care.  Baseline: Max A Goal status: DEFERRED as Elizabeth Martin has been discontinued  2.  Given this patient's Intake score of  75% on the Lymphedema Life Impact Scale (LLIS), patient will experience a reduction of at least 5 points in her perceived level of functional impairment resulting from lymphedema to improve functional performance and quality of life (QOL). Baseline: % Goal status: PROGRESSING  3.  Pt will achieve at least a 10% volume reduction in full, RLE limb volume to return  limb to typical size and shape, to limit infection risk and LE progression, to decrease pain, to improve function. Baseline: Dependent Goal status:PROGRESSING.  Volumetrics deferred until next week to focus on manual therapy and relief of pain/discomfort. To date by visual assessment I would estimate no more than a 10% volume reduction. DENSITY IN THIGH HAS REDUCED only SLIGHTLY. Morphea at R groin continues to present a significant obstacle to limb volume decongestion and fibrosis reduction.  Progress is slow. Lymphedema management  with combination of progressive, secondary lymphedema with connective tissue disease is very stubborn and challenging.   4.  Pt will obtain proper compression garments/devices and achieve modified independence (extra time + assistive devices) with donning/doffing to optimize limb volume reductions and limit LE  progression over time. Baseline:  Goal status: 01/04/24: PARTIALLY MET; In an effort to reduce burden of care on her spouse, Pt obtained adjustable, Velcro style R full leg, Circaid leg reduction kit for use s/p THA, but this proved not  effective for controlling swelling, and were not easier to don and doff more independently. We've resumed wrapping until she is able to fit back into custom compression garments. 02/14/24: Ongoing  5.  During Intensive phase CDT , with modified independence, Pt will achieve at least 85% compliance with all lymphedema self-care home program components, including daily skin care, compression wraps and /or garments, simple self MLD and lymphatic pumping therex to habituate LE self care protocol  into ADLs for optimal LE self-management over time. Baseline: Dependent Goal status: 02/14/24: GOAL MET and EXCEEDED PLAN:  OT FREQUENCY: 2x/week  OT DURATION: 12 weeks and PRN  PLANNED INTERVENTIONS: Complete Decongestive Therapy (Intensive and supported Self-Management Phases), 97110-Therapeutic exercises, 97530- Therapeutic activity, 97535-  Self Care, [id number]- Manual therapy, Patient/Family education, Taping, Manual lymph drainage, Scar mobilization, Compression bandaging, DME instructions, and skin care to reduce infection risk throughout manual therapy. Fit with replacement compression garments ASAP  PLAN FOR NEXT SESSION:  Cont RLE/RLQ MLD Cont multilayer compression wrapping Manufacturer's rep here to assist with custom garment measuring. Consider multi-pieces- bike shorts over thigh high Capri and knee high  Arnold Bicker, MS, OTR/L, CLT-LANA 04/09/24 4:10 PM

## 2024-04-10 ENCOUNTER — Ambulatory Visit: Admitting: Psychiatry

## 2024-04-11 ENCOUNTER — Encounter: Payer: Self-pay | Admitting: Occupational Therapy

## 2024-04-11 ENCOUNTER — Ambulatory Visit: Admitting: Occupational Therapy

## 2024-04-11 ENCOUNTER — Ambulatory Visit

## 2024-04-11 DIAGNOSIS — M25651 Stiffness of right hip, not elsewhere classified: Secondary | ICD-10-CM

## 2024-04-11 DIAGNOSIS — M25551 Pain in right hip: Secondary | ICD-10-CM

## 2024-04-11 DIAGNOSIS — I890 Lymphedema, not elsewhere classified: Secondary | ICD-10-CM | POA: Diagnosis not present

## 2024-04-11 DIAGNOSIS — R2681 Unsteadiness on feet: Secondary | ICD-10-CM

## 2024-04-11 DIAGNOSIS — R262 Difficulty in walking, not elsewhere classified: Secondary | ICD-10-CM

## 2024-04-11 DIAGNOSIS — M6281 Muscle weakness (generalized): Secondary | ICD-10-CM

## 2024-04-11 NOTE — Therapy (Signed)
 OUTPATIENT OCCUPATIONAL THERAPY TREATMENT NOTE and PROGRESS REPORT  LOWER EXTREMITY LYMPHEDEMA  Patient Name: Elizabeth Martin MRN: [id number] DOB:1960/12/15, 63 y.o., female Today's Date: 04/11/2024  REPORTING PERIOD: 03/14/24 - 04/11/24  END OF SESSION:  Lymphedema Episode 2   OT End of Session - 04/11/24 1114     Visit Number 30    Number of Visits 36    Date for OT Re-Evaluation 04/16/24    OT Start Time 1115    OT Stop Time 1215    OT Time Calculation (min) 60 min    Activity Tolerance Patient tolerated treatment well;No increased pain    Behavior During Therapy WFL for tasks assessed/performed              Past Medical History:  Diagnosis Date   Anxiety    Asthma    Autoimmune disease (HCC)    Cancer (HCC)    Endometrial lymph node removal (30)   Corneal abrasion, right 05/24/2022   Depression    GERD (gastroesophageal reflux disease)    H/O blood clots    upper rt groin and behind both knees   History of hiatal hernia    Hypertension    Lymphedema    right leg   Morphea scleroderma    PONV (postoperative nausea and vomiting)    states gets violently ill   Past Surgical History:  Procedure Laterality Date   BASAL CELL CARCINOMA EXCISION Left    CHOLECYSTECTOMY  2008   FLEXIBLE SIGMOIDOSCOPY N/A 05/19/2022   Procedure: FLEXIBLE SIGMOIDOSCOPY;  Surgeon: Toledo, Alphonsus Jeans, MD;  Location: [facility];  Service: Gastroenterology;  Laterality: N/A;   HERNIA REPAIR  2004   ILEOSTOMY CLOSURE N/A 08/24/2022   Procedure: ILEOSTOMY TAKEDOWN, open loop with Apolonio Bay, PA-C to assist;  Surgeon: Emmalene Hare, MD;  Location: [facility];  Service: General;  Laterality: N/A;   IVC FILTER INSERTION N/A 11/09/2023   Procedure: IVC FILTER INSERTION;  Surgeon: Celso College, MD;  Location: [facility];  Service: Cardiovascular;  Laterality: N/A;   IVC FILTER REMOVAL N/A 01/02/2024   Procedure: IVC FILTER REMOVAL;  Surgeon: Celso College, MD;  Location: [facility]  INVASIVE CV LAB;  Service: Cardiovascular;  Laterality: N/A;   PLANTAR FASCIECTOMY     ROBOTIC ASSISTED TOTAL HYSTERECTOMY WITH BILATERAL SALPINGO OOPHERECTOMY  02/14/2012   ROTATOR CUFF REPAIR Left 03/24/2022   TONSILLECTOMY     TOTAL HIP ARTHROPLASTY Right 11/14/2023   Procedure: TOTAL HIP ARTHROPLASTY - posterior;  Surgeon: Venus Ginsberg, MD;  Location: [facility];  Service: Orthopedics;  Laterality: Right;   XI ROBOTIC ASSISTED LOWER ANTERIOR RESECTION N/A 05/24/2022   Procedure: XI ROBOTIC ASSISTED LOWER ANTERIOR RESECTION;  Surgeon: Emmalene Hare, MD;  Location: [facility];  Service: General;  Laterality: N/A;   Patient Active Problem List   Diagnosis Date Noted   Constipation 01/10/2024   Nausea & vomiting 01/10/2024   S/P total right hip arthroplasty 11/14/2023   Blood coagulation disorder (HCC) 11/10/2023   PTSD (post-traumatic stress disorder) 11/10/2023   Severe episode of recurrent major depressive disorder, without psychotic features (HCC) 11/10/2023   GAD (generalized anxiety disorder) 11/10/2023   High risk medication use 11/10/2023   Anxiety and depression 03/22/2023   Cervical radiculopathy 03/16/2023   Colon cancer screening 03/15/2023   Chronic idiopathic constipation 03/15/2023   Dyspnea 03/07/2023   [facility] spotted fever 03/07/2023   Strain of gastrocnemius tendon 03/07/2023   Lower abdominal pain 03/07/2023   Pain in pelvis  03/07/2023   Small bowel obstruction (HCC) 02/11/2023   Raynaud disease 02/11/2023   Polyarthralgia 02/11/2023   Edema 01/03/2023   Diverticular disease 12/10/2022   Hot flashes 12/10/2022   Pain of right lower extremity 12/10/2022   Paresthesia of lower extremity 12/10/2022   Primary insomnia 12/10/2022   Thyroid  nodule 12/10/2022   Multiple joint pain 12/10/2022   Basal cell carcinoma of skin 12/10/2022   Acute bilateral low back pain without sciatica 11/10/2022   Chronic lower back pain 09/05/2022   S/P closure of ileostomy  08/24/2022   Ileostomy in place [facility])    Morphea 07/13/2022   Colonic stricture (HCC) 05/19/2022   Large bowel obstruction (HCC) 05/19/2022   Hypokalemia 05/18/2022   Abdominal pain 05/18/2022   Osteoarthritis of left knee 02/24/2022   Mixed connective tissue disease (HCC) 02/17/2022   Adhesive capsulitis of left shoulder 01/27/2022   Glenohumeral arthritis, left 01/27/2022   Microscopic hematuria 01/12/2022   Otorrhagia of right ear 12/10/2021   Elevated testosterone level in female 11/04/2021   Anti-TPO antibodies present 10/20/2021   Rash 10/13/2021   Hashimoto thyroiditis, 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   Hashimoto's disease 03/23/2021   Exposure to severe acute respiratory syndrome coronavirus 2 (SARS-CoV-2) 12/19/2020   Migraine without aura and without status migrainosus, not intractable 08/19/2020   Myofascial pain 07/30/2020   Neck pain 07/30/2020   Upper back pain 07/30/2020   Cervical facet joint syndrome 07/30/2020   Hair loss 07/14/2020   Edema of lower extremity 07/11/2020   Adenomatous colon polyp 02/26/2020   Gastroesophageal reflux disease 02/15/2020   Diarrhea 02/15/2020   Sleep disorder 01/16/2020   Hyperlipidemia 07/11/2019   Right shoulder pain 04/04/2019   Pre-operative clearance 12/13/2018   Endometrial cancer (HCC) 04/05/2018   Patellofemoral pain syndrome of right knee 03/20/2018   Gastroenteritis 12/07/2017   Fatigue 11/16/2017   Morbid obesity (HCC) 08/11/2017   Stucco keratoses 08/11/2017   Essential hypertension 08/09/2017   History of endometrial cancer 08/09/2017   Lymphedema 08/09/2017   Menopause 08/09/2017   Malignant neoplastic disease (HCC) 04/07/2017   Cellulitis 09/18/2016   Benign hypertension 06/02/2016   Asthma 06/04/1969    PCP: Eather Golder, MD  REFERRING PROVIDER: Eather Golder, MD  REFERRING DIAG: I89.0   THERAPY DIAG:  Lymphedema, not elsewhere classified  Rationale for Evaluation and Treatment:  Rehabilitation  ONSET DATE: 2013  (Cancer-related, endometrial 2013)  SUBJECTIVE:                                                                                                                                                                                           SUBJECTIVE STATEMENT:Patient  returns to OT for lymphedema care to RLE/RLQ.  Pt rates LE-related pain at 3/10 today. Pt reports she was finally released to start stretching with PT. "I'll probably be sore tomorrow."  DME vendor is checking on [employer]'s contribution. Pt states she spoke with her insurance company at length yesterday about her garment needs. Pt expresses concern over this situation because her insurance used to cover her compression garments without fail.   PERTINENT HISTORY: Asthma, Autoimmune Disease (Connective Tissue Disease), Endometrial Ca w/ LND ( 30 bilateral pelvic and periaortic LN), adjuvant chemotherapy and XRT,   H/O blood clots, RLE/RLQ lymphedema, Robotic assisted total hysterectomy w/ bilateral oophorectomy L Rotator Cuff repair, S/P ileostomy 2/2 colon stricture 05/2022; s/p R hip arthroplasty 11/11/23.   PAIN:  Are you having pain? YES. See subjective; 3/10 Pain location: RLE, R hip, cervical spine Pain description: sore, aching, heavy, full, tight,  Aggravating factors: standing, walking, extended dependent sitting Relieving factors: elevation, movement  PRECAUTIONS: Fall and Other: LYMPHEDEMA  WEIGHT BEARING RESTRICTIONS: Yes 5# lifting restriction- shoulder  FALLS:  Has patient fallen in last 6 months? No  LIVING ENVIRONMENT: Lives with: lives with their spouse Lives in: House/apartment Stairs: No;  Has following equipment at home: Single point cane, Environmental consultant - 2 wheeled, Environmental consultant - 4 wheeled, and Wheelchair (manual)  OCCUPATION: retired Control and instrumentation engineer: gardening, hiking, biking, dancing- unable to participate in any of these due to pain and swelling  HAND DOMINANCE: right   PRIOR  LEVEL OF FUNCTION: Independent with basic ADLs, Independent with household mobility without device, Independent with community mobility without device, Requires assistive device for independence, Needs assistance with homemaking, Needs assistance with transfers, and Leisure: decreased  social participation for leisure pursuits due to impaired mobility and pain  PATIENT GOALS:  Be able to move freely without pain Reduce limb volume to be able to lift limb for basic daily ADLs and functional ambulation Reduce limb volume to increase ability to perform lower body dressing and bathing and grooming Reduce limb to increase body image  OBJECTIVE: Note: Objective measures were completed at Evaluation unless otherwise noted.  COGNITION:  Overall cognitive status: Within functional limits for tasks assessed   OBSERVATIONS / OTHER ASSESSMENTS:   POSTURE: WFL  LE ROM: WFL, but Limited mildly at R knee and ankle due to girth, skin approximation, and joint pain  LE MMT: WFL  Mild, Stage  II, Bilateral Lower Extremity Lymphedema 2/2 CVI and Obesity  Skin  Description Hyper-Keratosis Peau d' Orange Shiny Tight Fibrotic/ Indurated Fatty Doughy Spongy/ boggy   x x x x R>L Severe morphea at  R groin obstructing lymphatics   x   Skin dry Flaky WNL Macerated   mildly sclerotic     Color Redness Varicosities Blanching Hemosiderin Stain Mottled   x x x   x   Odor Malodorous Yeast Fungal infection  WNL      x   Temperature Warm Cool wnl    x     Pitting Edema   1+ 2+ 3+ 4+ Non-pitting         x   Girth Symmetrical Asymmetrical                   Distribution    R>L RLE toes to groin    Stemmer Sign Positive Negative   +    Lymphorrhea History Of:  Present Absent     x    Wounds History Of Present Absent Venous Arterial Pressure Sheer  x        Signs of Infection Redness Warmth Erythema Acute Swelling Drainage Borders                    Sensation Light Touch Deep  pressure Hypersensitivity   In tact Impaired In tact Impaired Absent Impaired   x  x  x     Nails WNL   Fungus nail dystrophy   x     Hair Growth Symmetrical Asymmetrical    R>L   Skin Creases Base of toes  Ankles   Base of Fingers knees       Abdominal pannus Thigh Lobules  Face/neck   x x  x        BLE COMPARATIVE LIMB VOLUMETRICS 10/10/23  LANDMARK RIGHT  10/10/23  R LEG (A-D) 5466.5 ml  R THIGH (E-G) 9186.6 ml  R FULL LIMB (A-G) 14653.1 ml  Limb Volume differential (LVD)  LVD for LEG = 44.1%, R>L LVD for THIGH= 33.98%, R>L LVD for FULL lower extremity 37.8%, R>L  Volume change since last 08/11/22 R LEG is INCREASED in volume by 59.6%. L THIGH is INCREASED by 38 %, and RLE full limb is INCREASED by 45.38%.  Volume change overall V  (Blank rows = not tested)  LANDMARK LEFT  10/12/23  L LEG (A-D) 3053.6 ml  L THIGH (E-G) 6064.8 ml  L  FULL LIMB (A-G) 9118.4 ml  Limb Volume differential (LVD)  %  Volume change since initial %  Volume change overall %  (Blank rows = not tested)   10 th VISIT PROGRESS NOTE RLE COMPARATIVE LIMB VOLUMETRICS 01/04/24: Deferred until next visit by Pt request.  LANDMARK RIGHT    R LEG (A-D) TBA  R THIGH (E-G) TBA  R FULL LIMB (A-G) TBA  Limb Volume differential (LVD)    Volume change since last 08/11/22 TBA  Volume change overall TBA  (Blank rows = not tested)  20 th VISIT PROGRESS NOTE RLE COMPARATIVE LIMB VOLUMETRICS TBA next session  LANDMARK RIGHT    R LEG (A-D) 6005.7 ml  R THIGH (E-G) 9400.3 ml  R FULL LIMB (A-G) 15406.02 ml  Limb Volume differential (LVD)    Volume change since last measured on 10/10/23 R LEG is INCREASED in volume by 9.9%. L THIGH is INCREASED by 2.32 %, and RLE full limb is INCREASED by 5.13% since last measured on 10/10/23.  Volume change since commencing OT for CDT %    28 th VISIT PROGRESS NOTE RLE COMPARATIVE LIMB VOLUMETRICS TBA next session  LANDMARK RIGHT    R LEG (A-D) 5936.1 ml  R THIGH (E-G)  8538.6  ml  R FULL LIMB (A-G) 14524.7  ml  Limb Volume differential (LVD)    Volume change since last measured on 10/10/23 R LEG is DECREASED in volume by 1.2 %.  Since last measured on 03/14/24. R THIGH is DECREASED by 8.6 %, and RLE full limb is DECREASED by 5.72 % since last measured on 03/14/24.  Volume change since commencing OT for CDT %   ENDOMETRIAL CA-related LYMPHEDEMA Hx:  SURGERY TYPE/DATE: 2013 NUMBER OF LYMPH NODES REMOVED: 30 by report- bilateral pelvic and periaortic CHEMOTHERAPY: yes RADIATION:yes INFECTIONS: no hx cellulitis GAIT: Distance walked: >500 ft Assistive device utilized: single point cane Level of assistance: Modified independence- extra time Comments: R limp  LYMPHEDEMA LIFE IMPACT SCALE (LLIS): Intake 10/12/23 75% (The extent to which lymphedema related problems impacted your life over the past week)  FOTO (  functional outcome measure):10/10/23 INTAKE: 36% No Out take score. FOTO assessment discontinued by clinic.  PATIENT EDUCATION:  Education details: Continued Pt/ CG edu for lymphedema self care home program throughout session. Topics include outcome of comparative limb volumetrics- starting limb volume differentials (LVDs), technology and gradient techniques used for short stretch, multilayer compression wrapping, simple self-MLD, therapeutic lymphatic pumping exercises, skin/nail care, LE precautions,. compression garment recommendations and specifications, wear and care schedule and compression garment donning / doffing w assistive devices. Discussed progress towards all OT goals since commencing CDT. All questions answered to the Pt's satisfaction. Good return. Person educated: Patient Education method: Explanation, Demonstration, and Handouts Education comprehension: verbalized understanding, returned demonstration, and needs further education  LE SELF-CARE HOME  PROGRAM: Simple self-MLD/daily to affected quadrant and body part At least 2 x daily BLE  Lymphatic Pumping There ex 1 set of 10 reps each, in order, bilaterally Daily skin care to affected body part to limit infection risk and increase skin excursion Compression Bandaging Intensive stage compression: multilayer short stretch wraps with gradient techniques. One limb at a time. Length patient dependent. Self-management Phase: Appropriate daytime compression garment and hours-of-sleep device  Compression garments: Custom-made gradient compression garments and hours-of-sleep devices are medically necessary because they are uniquely sized and shaped to fit the exact dimensions of the affected extremities and to provide accurate and consistent gradient compression and containment, essential  for optimally managing chronic, progressive lymphedema. The convoluted HOS devices are medically necessary to facilitate increased lymphatic circulation and limit fibrosis formation when sleeping. Multiple custom compression garments are needed for optimal hygiene to limit infection risk. Custom compression garments should be replaced q 3-6 months When worn consistently for optimal lipo-lymphedema self-management over time.  ASSESSMENT:  CLINICAL IMPRESSION:  Pt tolerated RLE/RLQ MLD in supine as established working proximally to distal from neck, deep abdominal pathways, functional axillary LN, and J strokes  from thigh to toes. Reapplied compression wraps. Cont CDT 3 x weekly as per POC.   Cont as per POC.  10/10/23 Lymphedema Episode 2, Initial OT Evaluation : Dalilah Semmens is a 33 y a female presenting with chronic, progressive, moderate, Stage II, RLE/RLQ cancer -related lymphedema with onset 11 years ago after Rx for endometrial cancer. Pt is well known to this therapist as she has successfully undergone Complete Decongestive Therapy (CDT)  this clinic bin the past. Pt reports recent exacerbation of RLE swelling worsened as inflammation in L hip has worsened over time.  Pt has a hip replacement  scheduled in late December here it [facility]. RE limb volumetrics today reveal that R LEG volume is dramatically increased by 59.65 since last visit. R LEG VOLUME is INCREASED in volume by 59.6%. L THIGH is INCREASED by 38 %, and RLE full limb is INCREASED by 45.38%. Pt presents with LE-related skin changes. Prolonged inflammation in the R hip joint has likely overloaded  already RLE/RLQ lymphatics.  RLE/RLQ lymphedema limits Pt's ability to perform basic and instrumental ADLs, including functional ambulation, mobility and transfers, grooming , lower body bathing and dressing, skin inspection and skin care. Pt has difficulty  reaching her lower legs and feet to apply compression wraps and don/doff        compression stockings. LE limits ability to P[perform instrumental ADLs, including driving, yard work and home management activities. Lymphedema limits her ability to participate in leisure pursuits and productive activities, and it negatively impacts body image, life roles and quality of life.   Pt will benefit from an Intensive and  follow along course of lymphedema. CDT will consist of Manual Lymphatic gainage   (MLD), skin care, therapeutic exercise and compression, wraps initially then garments. Pt will need assistance throughout CDT   for applying compression wraps due to limited hip AROM and decreased skin flexibility. Without skilled Occupational Therapy for lymphedema care, lymphedema will progress and further functional decline is expected.   In prep for upcoming hip replacement OT will educate Pt re assistive devices, including tub transfer bench and elevated toilet seat, in keeping with hip precautions.  OBJECTIVE IMPAIRMENTS: Abnormal gait, decreased activity tolerance, decreased balance, decreased knowledge of use of DME, decreased mobility, difficulty walking, decreased ROM, decreased strength, increased edema, decreased skin flexibility, increased fascial restrictions, impaired sensation, pain, and  chronic, progressive LLE/LLQ lymphatic swelling and associated pain.   ADL LIMITATIONS: carrying, lifting, bending, sitting, standing, squatting, sleeping, stairs, transfers, bed mobility, bathing, dressing, hygiene/grooming, and productive activities, leisure pursuits, social participation, body image  driving, shopping, housework, yard work, cooking, meal prep  PERSONAL FACTORS: Past/current experiences and 3+ comorbidities: Morphea Scleroderma, Mixed connective tissue disease, and osteoarthritis  are also affecting patient's functional outcome.   REHAB POTENTIAL: Good  EVALUATION COMPLEXITY: Moderate  GOALS: Goals reviewed with patient? Yes  SHORT TERM GOALS: Target date: 4th OT Rx visit   Pt will demonstrate understanding of lymphedema precautions and prevention strategies with modified independence using a printed reference to identify at least 5 precautions and discussing how s/he may implement them into daily life to reduce risk of progression with extra time.  Baseline:Max A Goal status: GOAL MET  2.  Pt will be able to apply multilayer, knee length, gradient, compression wraps to one leg at a time with modified assistance (extra time and assistive device/s) to decrease limb volume, to limit infection risk, and to limit lymphedema progression.  Baseline: Dependent Goal status: GOAL MET  LONG TERM GOALS: Target date: 01/09/24 (12 weeks)  Given this patient's Intake score 36% on the functional outcomes FOTO tool, patient will experience an increase in function of 3 points to improve basic and instrumental ADLs performance, including lymphedema self-care.  Baseline: Max A Goal status: DEFERRED as [employer] has been discontinued  2.  Given this patient's Intake score of  75% on the Lymphedema Life Impact Scale (LLIS), patient will experience a reduction of at least 5 points in her perceived level of functional impairment resulting from lymphedema to improve functional performance and quality  of life (QOL). Baseline: % Goal status: PROGRESSING  3.  Pt will achieve at least a 10% volume reduction in full, RLE limb volume to return limb to typical size and shape, to limit infection risk and LE progression, to decrease pain, to improve function. Baseline: Dependent Goal status:PROGRESSING. See chart   4.  Pt will obtain proper compression garments/devices and achieve modified independence (extra time + assistive devices) with donning/doffing to optimize limb volume reductions and limit LE  progression over time. Baseline:  Goal status: 01/04/24: PARTIALLY MET; In an effort to reduce burden of care on her spouse, Pt obtained adjustable, Velcro style R full leg, Circaid leg reduction kit for use s/p THA, but this proved not effective for controlling swelling, and were not easier to don and doff more independently. We've resumed wrapping until she is able to fit back into custom compression garments. 02/14/24: Ongoing 04/11/24: Submitted new garment measurements to DME vendor for 3 piece system to address worsening lymphedema. Pt's insurance company denied coverage. She in the process of appealing.  5.  During Intensive phase CDT , with modified independence, Pt will achieve at least 85% compliance with all lymphedema self-care home program components, including daily skin care, compression wraps and /or garments, simple self MLD and lymphatic pumping therex to habituate LE self care protocol  into ADLs for optimal LE self-management over time. Baseline: Dependent Goal status: 02/14/24: GOAL MET and EXCEEDED PLAN:  OT FREQUENCY: 2-3 x/week  OT DURATION: 12 weeks and PRN  PLANNED INTERVENTIONS: Complete Decongestive Therapy (Intensive and supported Self-Management Phases), 97110-Therapeutic exercises, 97530- Therapeutic activity, 97535- Self Care, 25956- Manual therapy, Patient/Family education, Taping, Manual lymph drainage, Scar mobilization, Compression bandaging, DME instructions, and skin  care to reduce infection risk throughout manual therapy. Fit with replacement compression garments ASAP  PLAN FOR NEXT SESSION:  Cont MLD as established Cont multilayer compression wrapping   Arnold Bicker, MS, OTR/L, CLT-LANA 04/11/24 1:43 PM

## 2024-04-11 NOTE — Therapy (Incomplete)
 OUTPATIENT PHYSICAL THERAPY TREATMENT    Patient Name: Elizabeth Martin MRN: [id number] DOB:Feb 01, 1961, 63 y.o., female Today's Date: 04/12/2024  END OF SESSION:  PT End of Session - 04/11/24 0737     Visit Number 19    Number of Visits 35    Date for PT Re-Evaluation 06/04/24    Authorization Type [employer]    Progress Note Due on Visit 20    PT Start Time 1016    PT Stop Time 1059    PT Time Calculation (min) 43 min    Equipment Utilized During Treatment Gait belt    Activity Tolerance Patient limited by pain;Patient limited by fatigue    Behavior During Therapy [facility] for tasks assessed/performed                    Past Medical History:  Diagnosis Date   Anxiety    Asthma    Autoimmune disease (HCC)    Cancer (HCC)    Endometrial lymph node removal (30)   Corneal abrasion, right 05/24/2022   Depression    GERD (gastroesophageal reflux disease)    H/O blood clots    upper rt groin and behind both knees   History of hiatal hernia    Hypertension    Lymphedema    right leg   Morphea scleroderma    PONV (postoperative nausea and vomiting)    states gets violently ill   Past Surgical History:  Procedure Laterality Date   BASAL CELL CARCINOMA EXCISION Left    CHOLECYSTECTOMY  2008   FLEXIBLE SIGMOIDOSCOPY N/A 05/19/2022   Procedure: FLEXIBLE SIGMOIDOSCOPY;  Surgeon: Toledo, Alphonsus Jeans, MD;  Location: [facility];  Service: Gastroenterology;  Laterality: N/A;   HERNIA REPAIR  2004   ILEOSTOMY CLOSURE N/A 08/24/2022   Procedure: ILEOSTOMY TAKEDOWN, open loop with Apolonio Bay, PA-C to assist;  Surgeon: Emmalene Hare, MD;  Location: [facility] OR[facility];  Service: General;  Laterality: N/A;   IVC FILTER INSERTION N/A 11/09/2023   Procedure: IVC FILTER INSERTION;  Surgeon: Celso College, MD;  Location: [facility];  Service: Cardiovascular;  Laterality: N/A;   IVC FILTER REMOVAL N/A 01/02/2024   Procedure: IVC FILTER REMOVAL;  Surgeon: Celso College, MD;   Location: [facility];  Service: Cardiovascular;  Laterality: N/A;   PLANTAR FASCIECTOMY     ROBOTIC ASSISTED TOTAL HYSTERECTOMY WITH BILATERAL SALPINGO OOPHERECTOMY  02/14/2012   ROTATOR CUFF REPAIR Left 03/24/2022   TONSILLECTOMY     TOTAL HIP ARTHROPLASTY Right 11/14/2023   Procedure: TOTAL HIP ARTHROPLASTY - posterior;  Surgeon: Venus Ginsberg, MD;  Location: [facility] OR[facility];  Service: Orthopedics;  Laterality: Right;   XI ROBOTIC ASSISTED LOWER ANTERIOR RESECTION N/A 05/24/2022   Procedure: XI ROBOTIC ASSISTED LOWER ANTERIOR RESECTION;  Surgeon: Emmalene Hare, MD;  Location: [facility];  Service: General;  Laterality: N/A;   Patient Active Problem List   Diagnosis Date Noted   Constipation 01/10/2024   Nausea & vomiting 01/10/2024   S/P total right hip arthroplasty 11/14/2023   Blood coagulation disorder (HCC) 11/10/2023   PTSD (post-traumatic stress disorder) 11/10/2023   Severe episode of recurrent major depressive disorder, without psychotic features (HCC) 11/10/2023   GAD (generalized anxiety disorder) 11/10/2023   High risk medication use 11/10/2023   Anxiety and depression 03/22/2023   Cervical radiculopathy 03/16/2023   Colon cancer screening 03/15/2023   Chronic idiopathic constipation 03/15/2023   Dyspnea 03/07/2023   [place] spotted fever 03/07/2023   Strain of  gastrocnemius tendon 03/07/2023   Lower abdominal pain 03/07/2023   Pain in pelvis 03/07/2023   Small bowel obstruction (HCC) 02/11/2023   Raynaud disease 02/11/2023   Polyarthralgia 02/11/2023   Edema 01/03/2023   Diverticular disease 12/10/2022   Hot flashes 12/10/2022   Pain of right lower extremity 12/10/2022   Paresthesia of lower extremity 12/10/2022   Primary insomnia 12/10/2022   Thyroid  nodule 12/10/2022   Multiple joint pain 12/10/2022   Basal cell carcinoma of skin 12/10/2022   Acute bilateral low back pain without sciatica 11/10/2022   Chronic lower back pain 09/05/2022   S/P closure  of ileostomy 08/24/2022   Ileostomy in place [facility])    Morphea 07/13/2022   Colonic stricture (HCC) 05/19/2022   Large bowel obstruction (HCC) 05/19/2022   Hypokalemia 05/18/2022   Abdominal pain 05/18/2022   Osteoarthritis of left knee 02/24/2022   Mixed connective tissue disease (HCC) 02/17/2022   Adhesive capsulitis of left shoulder 01/27/2022   Glenohumeral arthritis, left 01/27/2022   Microscopic hematuria 01/12/2022   Otorrhagia of right ear 12/10/2021   Elevated testosterone level in female 11/04/2021   Anti-TPO antibodies present 10/20/2021   Rash 10/13/2021   Hashimoto thyroiditis, 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   Hashimoto's disease 03/23/2021   Exposure to severe acute respiratory syndrome coronavirus 2 (SARS-CoV-2) 12/19/2020   Migraine without aura and without status migrainosus, not intractable 08/19/2020   Myofascial pain 07/30/2020   Neck pain 07/30/2020   Upper back pain 07/30/2020   Cervical facet joint syndrome 07/30/2020   Hair loss 07/14/2020   Edema of lower extremity 07/11/2020   Adenomatous colon polyp 02/26/2020   Gastroesophageal reflux disease 02/15/2020   Diarrhea 02/15/2020   Sleep disorder 01/16/2020   Hyperlipidemia 07/11/2019   Right shoulder pain 04/04/2019   Pre-operative clearance 12/13/2018   Endometrial cancer (HCC) 04/05/2018   Patellofemoral pain syndrome of right knee 03/20/2018   Gastroenteritis 12/07/2017   Fatigue 11/16/2017   Morbid obesity (HCC) 08/11/2017   Stucco keratoses 08/11/2017   Essential hypertension 08/09/2017   History of endometrial cancer 08/09/2017   Lymphedema 08/09/2017   Menopause 08/09/2017   Malignant neoplastic disease (HCC) 04/07/2017   Cellulitis 09/18/2016   Benign hypertension 06/02/2016   Asthma 06/04/1969    PCP: Eather Golder MD   REFERRING PROVIDER: Eather Golder MD   REFERRING DIAG: s/p R hip replacement  THERAPY DIAG:  Muscle weakness (generalized)  Stiffness of right hip, not elsewhere  classified  Pain in right hip  Difficulty in walking, not elsewhere classified  Unsteadiness on feet  Rationale for Evaluation and Treatment: Rehabilitation  ONSET DATE: 11/14/23  SUBJECTIVE:   SUBJECTIVE STATEMENT:   Pt reports Right LE just feeling weaker and stiff. Reports went back to Ortho MD and states he told her she could stretch and do anything she wants- just don't stretch too extreme and stop if anything hurts.    PERTINENT HISTORY: s/p R hip arthropaslasty 11/14/23 with Dr. Clyda Dark posterior approach, blood coagulation disorder, PTSD, anxiety, cervical radiculopathy dypsnea, polyarthralgia, Raynaud disease, s/p closure of ileostomy, Hashimoto thyroid , endometrial cancer, menopause, endometrial cancer, lymphedema. Patient had home health PT prior to PT here.   PAIN:  Are you having pain?  2/10 Rt posterior deep hip and medial posterior gluteals   PRECAUTIONS: Other: recovering from hip sx  RED FLAGS: None   WEIGHT BEARING RESTRICTIONS: No  FALLS:  Has patient fallen in last 6 months? Yes. Number of falls 2  LIVING ENVIRONMENT: Lives with: lives with their spouse  Lives in: House/apartment Stairs: No Has following equipment at home: Single point cane  OCCUPATION: retired,   PLOF: Independent  PATIENT GOALS: return to hiking, working out, gardening.   NEXT MD VISIT: March 2025   OBJECTIVE:  Note: Objective measures were completed at Evaluation unless otherwise noted.  DIAGNOSTIC FINDINGS:  IMPRESSION: "Right hip replacement.  No visible complicating feature."  PATIENT SURVEYS:  LEFS 5/80  COGNITION: Overall cognitive status: Within functional limits for tasks assessed    MUSCLE LENGTH: Hamstrings: limited bilaterally with R>L   LOWER EXTREMITY ROM:  Passive ROM Right  04/02/24 Right eval Left eval  Hip flexion >90 degrees 48 flexion PROM supine   Hip extension 0 degrees    Hip abduction ~25 degrees 5 degrees AROM     (Blank rows = not  tested)  LOWER EXTREMITY MMT:  MMT Right 04/02/24 Right eval Left eval  Hip flexion 4/5 2+ 5  Hip extension (supine 30 deg) 2-/5   5  Hip Hor ADD 5/5    Hip abduction 2+/5 2+*  5  Hip Hor ABDCT 4+/5    Hip adduction  2+ 5  Hip internal rotation 4+/5    Hip external rotation 3-/5     Knee flexion 5/5 3 5   Knee extension 4+/5 3 5   Ankle dorsiflexion 5/5 3 5   Ankle plantarflexion 5/5 3 5    (Blank rows = not tested)   FUNCTIONAL TESTS:  5 times sit to stand: 29 6 minute walk test:  10 meter walk test: 15 seconds with SPC   GAIT: Distance walked: 60 ft Assistive device utilized: Single point cane Level of assistance: CGA Comments: antalgic gait pattern with decreased stance time RLE. Slight vaulting pattern.  Stair negotiation: -step to pattern lead with LLE, one hand on cane one hand on rail ascending. Descending step to pattern down with RLE.                                                                                                                               TREATMENT DATE:  04/12/24   Therex:  Patient instructed in the following gentle hip stretching: Hold 30 sec x 4 of each Supine hamstring stretch Standing hamstring stretch Supine Piriformis stretch Supine Hip flexor stretch Standing hip flexor stretch  Therapeutic Activities: dynamic therapeutic activities  designed to achieve improved functional performance      - Standing Hip ext + (gluteal squeeze at endpoint) 2 x 10 reps   -Standing ham curls with BUE Support x 15, 5x reps  - Standing calf raises with BUE Support 1 set of 10 normal  - Standing Hip circles CW/CCW x 10 each LE (Straight leg around blue cylinder foam approx 6-8" tall)      PATIENT EDUCATION:  Education details: exercise technique Person educated: Patient Education method: Explanation, Demonstration, Tactile cues, and Verbal cues Education comprehension: verbalized understanding, returned demonstration, verbal cues  required, tactile cues required, and needs further  education  HOME EXERCISE PROGRAM:  Access Code: [facility] URL: [email or web address] Date: 04/11/2024 Prepared by: Ferrell Hu  Exercises - Side Stepping with Counter Support  - 1 x daily - 7 x weekly - 2 sets - 10 reps - 5 hold - Standing Hip Extension  - 1 x daily - 7 x weekly - 2 sets - 10 reps - 5 hold - Standing March with Counter Support  - 1 x daily - 7 x weekly - 2 sets - 10 reps - 5 hold - Standing Hip Abduction with Counter Support  - 1 x daily - 7 x weekly - 2 sets - 10 reps - Seated Hamstring Stretch  - 1 x daily - 7 x weekly - 2 sets - 2 reps - 30 hold - Sit to Stand  - 1 x daily - 7 x weekly - 2 sets - 10 reps - 5 hold - Supine Heel Slide  - 1 x daily - 7 x weekly - 2 sets - 6 reps - Supine Gluteal Sets  - 1 x daily - 7 x weekly - 2 sets - 10 reps - Supine Bridge  - 1 x daily - 7 x weekly - 3 sets - 5 reps - 2-3sec hold - Supine Short Arc Quad  - 1 x daily - 7 x weekly - 3 sets - 15 reps - Small Range Straight Leg Raise  - 1 x daily - 7 x weekly - 3 sets - 5 reps - Hooklying Clamshell with Resistance  - 1 x daily - 7 x weekly - 3 sets - 15 reps - Prone Hip Extension with Bent Knee  - 1 x daily - 7 x weekly - 3 sets - 10 reps - Prone Hip Extension Sequence  - 1 x daily - 7 x weekly - 3 sets - 10 reps - Supine Hamstring Stretch with Strap  - 1-2 x daily - 3 sets - 30sec hold - Modified Thomas Stretch  - 1-2 x daily - 3 sets - 30 sec hold - Hip Flexor Stretch on Step  - 1-2 x daily - 3 sets - 30 sec hold - Standing Hamstring Stretch with Step  - 1 x daily - 7 x weekly - 3 sets - 30 sec hold - Supine Figure 4 Piriformis Stretch  - 1-2 x daily - 3 sets - 30 hold - Supine Piriformis Stretch  - 1-2 x daily - 3 sets - 30 sec hold        Access Code: [email or web address] URL: [email or web address] Date: 02/08/2024; updated 03/21/2024 Prepared by: [employer]  Exercises - Side  Stepping with Counter Support  - 1 x daily - 7 x weekly - 2 sets - 10 reps - 5 hold - Standing Hip Extension  - 1 x daily - 7 x weekly - 2 sets - 10 reps - 5 hold - Standing March with Counter Support  - 1 x daily - 7 x weekly - 2 sets - 10 reps - 5 hold - Standing Hip Abduction with Counter Support  - 1 x daily - 7 x weekly - 2 sets - 10 reps - Seated Hamstring Stretch  - 1 x daily - 7 x weekly - 2 sets - 2 reps - 30 hold - Sit to Stand  - 1 x daily - 7 x weekly - 2 sets - 10 reps - 5 hold - Supine Heel Slide  - 1 x daily - 7 x weekly - 2  sets - 6 reps - Supine Gluteal Sets  - 1 x daily - 7 x weekly - 2 sets - 10 reps - Supine Bridge  - 1 x daily - 7 x weekly - 3 sets - 5 reps - 2-3sec hold - Supine Short Arc Quad  - 1 x daily - 7 x weekly - 3 sets - 15 reps - Small Range Straight Leg Raise  - 1 x daily - 7 x weekly - 3 sets - 5 reps - Hooklying Clamshell with Resistance  - 1 x daily - 7 x weekly - 3 sets - 15 reps - Prone Hip Extension with Bent Knee  - 1 x daily - 7 x weekly - 3 sets - 10 reps - Prone Hip Extension Sequence  - 1 x daily - 7 x weekly - 3 sets - 10 reps  ASSESSMENT: CLINICAL IMPRESSION:   Patient was instructed in some gentle hip stretching but instructed to be cautious and not overstretch and stop if any pain other than a stretch. She was able to follow all cues and reported feeling much better after stretching today.  Pt will continue to benefit from skilled therapy to address remaining deficits in order to improve overall QoL and return to PLOF.      OBJECTIVE IMPAIRMENTS: Abnormal gait, decreased activity tolerance, decreased balance, decreased coordination, decreased endurance, decreased mobility, difficulty walking, decreased ROM, decreased strength, hypomobility, increased edema, impaired perceived functional ability, impaired flexibility, improper body mechanics, postural dysfunction, and pain.   ACTIVITY LIMITATIONS: carrying, lifting, bending, sitting, standing,  squatting, sleeping, stairs, transfers, bed mobility, bathing, toileting, dressing, reach over head, hygiene/grooming, locomotion level, and caring for others  PARTICIPATION LIMITATIONS: meal prep, cleaning, laundry, interpersonal relationship, driving, shopping, community activity, and yard work  PERSONAL FACTORS: Age, Past/current experiences, Time since onset of injury/illness/exacerbation, and 3+ comorbidities: blood coagulation disorder, PTSD, anxiety, cervical radiculopathy dypsnea, polyarthralgia, Raynaud disease, s/p closure of ileostomy, Hashimoto thyroid , endometrial cancer, menopause, endometrial cancer, lymphedema are also affecting patient's functional outcome.   REHAB POTENTIAL: Good  CLINICAL DECISION MAKING: Evolving/moderate complexity  EVALUATION COMPLEXITY: Moderate   GOALS: Goals reviewed with patient? Yes  SHORT TERM GOALS: Target date: 01/19/2024  Patient will be independent in home exercise program to improve strength/mobility for better functional independence with ADLs. Baseline:1/30: HEP given 3/12: pt reports completing 3-4 days a week depending on soreness.  Goal status:  MET  LONG TERM GOALS: Target date: 06/04/2024  Patient (> 52 years old) will complete five times sit to stand test in < 10 seconds indicating an increased LE strength and improved balance. Baseline: 1/30: 29 seconds 3/12: 19.41 03/12/2024= 17.44 seconds- without UE support Goal status: PROGRESSING  2.  Patient will increase six minute walk test distance to >1000 for progression to community ambulator and improve gait ability Baseline: 430 ft in 5 min and required seated rest to end test due to fatigue, uses SPC 858ft with SPC and no standing or seated rest break; 03/12/2024= 950 feet with SPC Goal status: in progress   3.  Patient will increase 10 meter walk test to >1.2m/s as to improve gait speed for better community ambulation and to reduce fall risk. Baseline: 1/30: 15 seconds with SPC   Without SPC: 10.63 and 10.38 speed: 0.95 m/s; with SPC 10.98 and 11.18; 03/12/2024= 1.0 m/s  Goal status: PROGRESSING  4.  Patient will increase lower extremity functional scale to >60/80 to demonstrate improved functional mobility and increased tolerance with ADLs.  Baseline: 1/30:  5/80 3/12: 20/80; 03/12/2024= 29/80 Goal status: Progressing  PLAN:  PT FREQUENCY: 1-2x/week PT DURATION: 12 weeks PLANNED INTERVENTIONS: 97164- PT Re-evaluation, 97110-Therapeutic exercises, 97530- Therapeutic activity, 97112- Neuromuscular re-education, 97535- Self Care, 16109- Manual therapy, 97116- Gait training, [phone]- Splinting, 97014- Electrical stimulation (unattended), [phone]- Electrical stimulation (manual), 97035- Ultrasound, 91478- Traction (mechanical), Patient/Family education, Balance training, Stair training, Taping, Joint mobilization, Joint manipulation, Spinal manipulation, Spinal mobilization, Manual lymph drainage, Scar mobilization, Compression bandaging, Vestibular training, Visual/preceptual remediation/compensation, DME instructions, Cryotherapy, and Moist heat  PLAN FOR NEXT SESSION:   Continue with gentle hip stretching Progress with RLE strengthening  Continue with progressive Dynamic balance- tandem, narrowed BOS, SLS Progress note due next visit  7:48 AM, 04/12/24 Ossie Blend, PT Physical Therapist - [facility] [place]  [phone] (ASCOM)   04/12/24, 7:48 AM

## 2024-04-16 ENCOUNTER — Ambulatory Visit

## 2024-04-16 ENCOUNTER — Encounter: Payer: Self-pay | Admitting: Occupational Therapy

## 2024-04-16 ENCOUNTER — Ambulatory Visit: Admitting: Occupational Therapy

## 2024-04-16 DIAGNOSIS — R2681 Unsteadiness on feet: Secondary | ICD-10-CM

## 2024-04-16 DIAGNOSIS — M6281 Muscle weakness (generalized): Secondary | ICD-10-CM

## 2024-04-16 DIAGNOSIS — M25551 Pain in right hip: Secondary | ICD-10-CM

## 2024-04-16 DIAGNOSIS — I890 Lymphedema, not elsewhere classified: Secondary | ICD-10-CM

## 2024-04-16 DIAGNOSIS — M25651 Stiffness of right hip, not elsewhere classified: Secondary | ICD-10-CM

## 2024-04-16 DIAGNOSIS — R262 Difficulty in walking, not elsewhere classified: Secondary | ICD-10-CM

## 2024-04-16 NOTE — Therapy (Signed)
 OUTPATIENT OCCUPATIONAL THERAPY TREATMENT NOT  LOWER EXTREMITY LYMPHEDEMA  Patient Name: Elizabeth Martin MRN: [id number] DOB:[birth year], 63 y.o., female Today's Date: 04/16/2024   END OF SESSION:  Lymphedema Episode 2   OT End of Session - 04/16/24 1118     Visit Number 31    Number of Visits 36    Date for OT Re-Evaluation 04/16/24    OT Start Time 1115    OT Stop Time 1215    OT Time Calculation (min) 60 min    Activity Tolerance Patient tolerated treatment well;No increased pain    Behavior During Therapy WFL for tasks assessed/performed              Past Medical History:  Diagnosis Date   Anxiety    Asthma    Autoimmune disease (HCC)    Cancer (HCC)    Endometrial lymph node removal (30)   Corneal abrasion, right 05/24/2022   Depression    GERD (gastroesophageal reflux disease)    H/O blood clots    upper rt groin and behind both knees   History of hiatal hernia    Hypertension    Lymphedema    right leg   Morphea scleroderma    PONV (postoperative nausea and vomiting)    states gets violently ill   Past Surgical History:  Procedure Laterality Date   BASAL CELL CARCINOMA EXCISION Left    CHOLECYSTECTOMY  2008   FLEXIBLE SIGMOIDOSCOPY N/A 05/19/2022   Procedure: FLEXIBLE SIGMOIDOSCOPY;  Surgeon: Toledo, Alphonsus Jeans, MD;  Location: [facility];  Service: Gastroenterology;  Laterality: N/A;   HERNIA REPAIR  2004   ILEOSTOMY CLOSURE N/A 08/24/2022   Procedure: ILEOSTOMY TAKEDOWN, open loop with Apolonio Bay, PA-C to assist;  Surgeon: Emmalene Hare, MD;  Location: [facility];  Service: General;  Laterality: N/A;   IVC FILTER INSERTION N/A 11/09/2023   Procedure: IVC FILTER INSERTION;  Surgeon: Celso College, MD;  Location: [facility];  Service: Cardiovascular;  Laterality: N/A;   IVC FILTER REMOVAL N/A 01/02/2024   Procedure: IVC FILTER REMOVAL;  Surgeon: Celso College, MD;  Location: [facility];  Service: Cardiovascular;  Laterality:  N/A;   PLANTAR FASCIECTOMY     ROBOTIC ASSISTED TOTAL HYSTERECTOMY WITH BILATERAL SALPINGO OOPHERECTOMY  02/14/2012   ROTATOR CUFF REPAIR Left 03/24/2022   TONSILLECTOMY     TOTAL HIP ARTHROPLASTY Right 11/14/2023   Procedure: TOTAL HIP ARTHROPLASTY - posterior;  Surgeon: Venus Ginsberg, MD;  Location: [facility];  Service: Orthopedics;  Laterality: Right;   XI ROBOTIC ASSISTED LOWER ANTERIOR RESECTION N/A 05/24/2022   Procedure: XI ROBOTIC ASSISTED LOWER ANTERIOR RESECTION;  Surgeon: Emmalene Hare, MD;  Location: [facility];  Service: General;  Laterality: N/A;   Patient Active Problem List   Diagnosis Date Noted   Constipation 01/10/2024   Nausea & vomiting 01/10/2024   S/P total right hip arthroplasty 11/14/2023   Blood coagulation disorder (HCC) 11/10/2023   PTSD (post-traumatic stress disorder) 11/10/2023   Severe episode of recurrent major depressive disorder, without psychotic features (HCC) 11/10/2023   GAD (generalized anxiety disorder) 11/10/2023   High risk medication use 11/10/2023   Anxiety and depression 03/22/2023   Cervical radiculopathy 03/16/2023   Colon cancer screening 03/15/2023   Chronic idiopathic constipation 03/15/2023   Dyspnea 03/07/2023   [facility] spotted fever 03/07/2023   Strain of gastrocnemius tendon 03/07/2023   Lower abdominal pain 03/07/2023   Pain in pelvis 03/07/2023   Small bowel obstruction (HCC) 02/11/2023  Raynaud disease 02/11/2023   Polyarthralgia 02/11/2023   Edema 01/03/2023   Diverticular disease 12/10/2022   Hot flashes 12/10/2022   Pain of right lower extremity 12/10/2022   Paresthesia of lower extremity 12/10/2022   Primary insomnia 12/10/2022   Thyroid  nodule 12/10/2022   Multiple joint pain 12/10/2022   Basal cell carcinoma of skin 12/10/2022   Acute bilateral low back pain without sciatica 11/10/2022   Chronic lower back pain 09/05/2022   S/P closure of ileostomy 08/24/2022   Ileostomy in place [facility])    Morphea  07/13/2022   Colonic stricture (HCC) 05/19/2022   Large bowel obstruction (HCC) 05/19/2022   Hypokalemia 05/18/2022   Abdominal pain 05/18/2022   Osteoarthritis of left knee 02/24/2022   Mixed connective tissue disease (HCC) 02/17/2022   Adhesive capsulitis of left shoulder 01/27/2022   Glenohumeral arthritis, left 01/27/2022   Microscopic hematuria 01/12/2022   Otorrhagia of right ear 12/10/2021   Elevated testosterone level in female 11/04/2021   Anti-TPO antibodies present 10/20/2021   Rash 10/13/2021   Hashimoto thyroiditis, 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   Hashimoto's disease 03/23/2021   Exposure to severe acute respiratory syndrome coronavirus 2 (SARS-CoV-2) 12/19/2020   Migraine without aura and without status migrainosus, not intractable 08/19/2020   Myofascial pain 07/30/2020   Neck pain 07/30/2020   Upper back pain 07/30/2020   Cervical facet joint syndrome 07/30/2020   Hair loss 07/14/2020   Edema of lower extremity 07/11/2020   Adenomatous colon polyp 02/26/2020   Gastroesophageal reflux disease 02/15/2020   Diarrhea 02/15/2020   Sleep disorder 01/16/2020   Hyperlipidemia 07/11/2019   Right shoulder pain 04/04/2019   Pre-operative clearance 12/13/2018   Endometrial cancer (HCC) 04/05/2018   Patellofemoral pain syndrome of right knee 03/20/2018   Gastroenteritis 12/07/2017   Fatigue 11/16/2017   Morbid obesity (HCC) 08/11/2017   Stucco keratoses 08/11/2017   Essential hypertension 08/09/2017   History of endometrial cancer 08/09/2017   Lymphedema 08/09/2017   Menopause 08/09/2017   Malignant neoplastic disease (HCC) 04/07/2017   Cellulitis 09/18/2016   Benign hypertension 06/02/2016   Asthma 06/04/1969    PCP: Eather Golder, MD  REFERRING PROVIDER: Eather Golder, MD  REFERRING DIAG: I89.0   THERAPY DIAG:  Lymphedema, not elsewhere classified  Rationale for Evaluation and Treatment: Rehabilitation  ONSET DATE: 2013  (Cancer-related, endometrial  2013)  SUBJECTIVE:                                                                                                                                                                                           SUBJECTIVE STATEMENT:Patient returns to OT for lymphedema care to RLE/RLQ.  Pt  rates LE-related pain at 3/10 today. Pt reports she was finally released to start stretching with PT. "I'll probably be sore tomorrow."  t Pt reports she spoke with her insurance provider again and discussed coverage needs for lymphedema custom garments. Pt states she called DME vendor and left VM, but has had no return call. . OT emailed DME vendor and requested she return Pt's call.   PERTINENT HISTORY: Asthma, Autoimmune Disease (Connective Tissue Disease), Endometrial Ca w/ LND ( 30 bilateral pelvic and periaortic LN), adjuvant chemotherapy and XRT,   H/O blood clots, RLE/RLQ lymphedema, Robotic assisted total hysterectomy w/ bilateral oophorectomy L Rotator Cuff repair, S/P ileostomy 2/2 colon stricture 05/2022; s/p R hip arthroplasty 11/11/23.   PAIN:  Are you having pain? YES. See subjective; 3/10 Pain location: RLE, R hip, cervical spine Pain description: sore, aching, heavy, full, tight,  Aggravating factors: standing, walking, extended dependent sitting Relieving factors: elevation, movement  PRECAUTIONS: Fall and Other: LYMPHEDEMA  WEIGHT BEARING RESTRICTIONS: Yes 5# lifting restriction- shoulder  FALLS:  Has patient fallen in last 6 months? No  LIVING ENVIRONMENT: Lives with: lives with their spouse Lives in: House/apartment Stairs: No;  Has following equipment at home: Single point cane, Environmental consultant - 2 wheeled, Environmental consultant - 4 wheeled, and Wheelchair (manual)  OCCUPATION: retired Control and instrumentation engineer: gardening, hiking, biking, dancing- unable to participate in any of these due to pain and swelling  HAND DOMINANCE: right   PRIOR LEVEL OF FUNCTION: Independent with basic ADLs, Independent with household  mobility without device, Independent with community mobility without device, Requires assistive device for independence, Needs assistance with homemaking, Needs assistance with transfers, and Leisure: decreased  social participation for leisure pursuits due to impaired mobility and pain  PATIENT GOALS:  Be able to move freely without pain Reduce limb volume to be able to lift limb for basic daily ADLs and functional ambulation Reduce limb volume to increase ability to perform lower body dressing and bathing and grooming Reduce limb to increase body image  OBJECTIVE: Note: Objective measures were completed at Evaluation unless otherwise noted.  COGNITION:  Overall cognitive status: Within functional limits for tasks assessed   OBSERVATIONS / OTHER ASSESSMENTS:   POSTURE: WFL  LE ROM: WFL, but Limited mildly at R knee and ankle due to girth, skin approximation, and joint pain  LE MMT: WFL  Mild, Stage  II, Bilateral Lower Extremity Lymphedema 2/2 CVI and Obesity  Skin  Description Hyper-Keratosis Peau d' Orange Shiny Tight Fibrotic/ Indurated Fatty Doughy Spongy/ boggy   x x x x R>L Severe morphea at  R groin obstructing lymphatics   x   Skin dry Flaky WNL Macerated   mildly sclerotic     Color Redness Varicosities Blanching Hemosiderin Stain Mottled   x x x   x   Odor Malodorous Yeast Fungal infection  WNL      x   Temperature Warm Cool wnl    x     Pitting Edema   1+ 2+ 3+ 4+ Non-pitting         x   Girth Symmetrical Asymmetrical                   Distribution    R>L RLE toes to groin    Stemmer Sign Positive Negative   +    Lymphorrhea History Of:  Present Absent     x    Wounds History Of Present Absent Venous Arterial Pressure Sheer     x  Signs of Infection Redness Warmth Erythema Acute Swelling Drainage Borders                    Sensation Light Touch Deep pressure Hypersensitivity   In tact Impaired In tact Impaired Absent Impaired    x  x  x     Nails WNL   Fungus nail dystrophy   x     Hair Growth Symmetrical Asymmetrical    R>L   Skin Creases Base of toes  Ankles   Base of Fingers knees       Abdominal pannus Thigh Lobules  Face/neck   x x  x        BLE COMPARATIVE LIMB VOLUMETRICS 10/10/23  LANDMARK RIGHT  10/10/23  R LEG (A-D) 5466.5 ml  R THIGH (E-G) 9186.6 ml  R FULL LIMB (A-G) 14653.1 ml  Limb Volume differential (LVD)  LVD for LEG = 44.1%, R>L LVD for THIGH= 33.98%, R>L LVD for FULL lower extremity 37.8%, R>L  Volume change since last 08/11/22 R LEG is INCREASED in volume by 59.6%. L THIGH is INCREASED by 38 %, and RLE full limb is INCREASED by 45.38%.  Volume change overall V  (Blank rows = not tested)  LANDMARK LEFT  10/12/23  L LEG (A-D) 3053.6 ml  L THIGH (E-G) 6064.8 ml  L  FULL LIMB (A-G) 9118.4 ml  Limb Volume differential (LVD)  %  Volume change since initial %  Volume change overall %  (Blank rows = not tested)   10 th VISIT PROGRESS NOTE RLE COMPARATIVE LIMB VOLUMETRICS 01/04/24: Deferred until next visit by Pt request.  LANDMARK RIGHT    R LEG (A-D) TBA  R THIGH (E-G) TBA  R FULL LIMB (A-G) TBA  Limb Volume differential (LVD)    Volume change since last 08/11/22 TBA  Volume change overall TBA  (Blank rows = not tested)  20 th VISIT PROGRESS NOTE RLE COMPARATIVE LIMB VOLUMETRICS TBA next session  LANDMARK RIGHT    R LEG (A-D) 6005.7 ml  R THIGH (E-G) 9400.3 ml  R FULL LIMB (A-G) 15406.02 ml  Limb Volume differential (LVD)    Volume change since last measured on 10/10/23 R LEG is INCREASED in volume by 9.9%. L THIGH is INCREASED by 2.32 %, and RLE full limb is INCREASED by 5.13% since last measured on 10/10/23.  Volume change since commencing OT for CDT %    28 th VISIT PROGRESS NOTE RLE COMPARATIVE LIMB VOLUMETRICS TBA next session  LANDMARK RIGHT    R LEG (A-D) 5936.1 ml  R THIGH (E-G) 8538.6  ml  R FULL LIMB (A-G) 14524.7  ml  Limb Volume differential (LVD)     Volume change since last measured on 10/10/23 R LEG is DECREASED in volume by 1.2 %.  Since last measured on 03/14/24. R THIGH is DECREASED by 8.6 %, and RLE full limb is DECREASED by 5.72 % since last measured on 03/14/24.  Volume change since commencing OT for CDT %   ENDOMETRIAL CA-related LYMPHEDEMA Hx:  SURGERY TYPE/DATE: 2013 NUMBER OF LYMPH NODES REMOVED: 30 by report- bilateral pelvic and periaortic CHEMOTHERAPY: yes RADIATION:yes INFECTIONS: no hx cellulitis GAIT: Distance walked: >500 ft Assistive device utilized: single point cane Level of assistance: Modified independence- extra time Comments: R limp  LYMPHEDEMA LIFE IMPACT SCALE (LLIS): Intake 10/12/23 75% (The extent to which lymphedema related problems impacted your life over the past week)  FOTO (functional outcome measure):10/10/23 INTAKE: 36% No Out take  score. FOTO assessment discontinued by clinic.  PATIENT EDUCATION:  Education details: Continued Pt/ CG edu for lymphedema self care home program throughout session. Topics include outcome of comparative limb volumetrics- starting limb volume differentials (LVDs), technology and gradient techniques used for short stretch, multilayer compression wrapping, simple self-MLD, therapeutic lymphatic pumping exercises, skin/nail care, LE precautions,. compression garment recommendations and specifications, wear and care schedule and compression garment donning / doffing w assistive devices. Discussed progress towards all OT goals since commencing CDT. All questions answered to the Pt's satisfaction. Good return. Person educated: Patient Education method: Explanation, Demonstration, and Handouts Education comprehension: verbalized understanding, returned demonstration, and needs further education  LE SELF-CARE HOME  PROGRAM: Simple self-MLD/daily to affected quadrant and body part At least 2 x daily BLE Lymphatic Pumping There ex 1 set of 10 reps each, in order, bilaterally Daily  skin care to affected body part to limit infection risk and increase skin excursion Compression Bandaging Intensive stage compression: multilayer short stretch wraps with gradient techniques. One limb at a time. Length patient dependent. Self-management Phase: Appropriate daytime compression garment and hours-of-sleep device  Compression garments: Custom-made gradient compression garments and hours-of-sleep devices are medically necessary because they are uniquely sized and shaped to fit the exact dimensions of the affected extremities and to provide accurate and consistent gradient compression and containment, essential  for optimally managing chronic, progressive lymphedema. The convoluted HOS devices are medically necessary to facilitate increased lymphatic circulation and limit fibrosis formation when sleeping. Multiple custom compression garments are needed for optimal hygiene to limit infection risk. Custom compression garments should be replaced q 3-6 months When worn consistently for optimal lipo-lymphedema self-management over time.  ASSESSMENT:  CLINICAL IMPRESSION:  Continued RLE/RLQ MLD in side lying and supine as established working proximally to distal from neck, deep abdominal pathways, functional axillary LN, and J strokes  from thigh to toes. Rpt tolerated MLD and deeper fibrosis techniques without increased pain. At present RLE/RLQ is intractable to CDT.  Limb density and swelling are minimally reduced and softened since commencing CDT after hip replacement . Inflammation in limb   is stubborn and persistent in context of mixed connective tissue d disease. After MLD we Applied LLE multilayer compression wraps as established. Cont CDT 3 x weekly as per POC.  10/10/23 Lymphedema Episode 2, Initial OT Evaluation : Elizabeth Martin is a 55 y a female presenting with chronic, progressive, moderate, Stage II, RLE/RLQ cancer -related lymphedema with onset 11 years ago after Rx for endometrial  cancer. Pt is well known to this therapist as she has successfully undergone Complete Decongestive Therapy (CDT)  this clinic bin the past. Pt reports recent exacerbation of RLE swelling worsened as inflammation in L hip has worsened over time.  Pt has a hip replacement scheduled in late December here it [facility]. RE limb volumetrics today reveal that R LEG volume is dramatically increased by 59.65 since last visit. R LEG VOLUME is INCREASED in volume by 59.6%. L THIGH is INCREASED by 38 %, and RLE full limb is INCREASED by 45.38%. Pt presents with LE-related skin changes. Prolonged inflammation in the R hip joint has likely overloaded  already RLE/RLQ lymphatics.  RLE/RLQ lymphedema limits Pt's ability to perform basic and instrumental ADLs, including functional ambulation, mobility and transfers, grooming , lower body bathing and dressing, skin inspection and skin care. Pt has difficulty  reaching her lower legs and feet to apply compression wraps and don/doff        compression stockings. LE limits  ability to P[perform instrumental ADLs, including driving, yard work and home management activities. Lymphedema limits her ability to participate in leisure pursuits and productive activities, and it negatively impacts body image, life roles and quality of life.   Pt will benefit from an Intensive and follow along course of lymphedema. CDT will consist of Manual Lymphatic gainage   (MLD), skin care, therapeutic exercise and compression, wraps initially then garments. Pt will need assistance throughout CDT   for applying compression wraps due to limited hip AROM and decreased skin flexibility. Without skilled Occupational Therapy for lymphedema care, lymphedema will progress and further functional decline is expected.   In prep for upcoming hip replacement OT will educate Pt re assistive devices, including tub transfer bench and elevated toilet seat, in keeping with hip precautions.  OBJECTIVE IMPAIRMENTS: Abnormal  gait, decreased activity tolerance, decreased balance, decreased knowledge of use of DME, decreased mobility, difficulty walking, decreased ROM, decreased strength, increased edema, decreased skin flexibility, increased fascial restrictions, impaired sensation, pain, and chronic, progressive LLE/LLQ lymphatic swelling and associated pain.   ADL LIMITATIONS: carrying, lifting, bending, sitting, standing, squatting, sleeping, stairs, transfers, bed mobility, bathing, dressing, hygiene/grooming, and productive activities, leisure pursuits, social participation, body image driving, shopping, housework, yard work, cooking, meal prep  PERSONAL FACTORS: Past/current experiences and 3+ comorbidities: Morphea Scleroderma, Mixed connective tissue disease, and osteoarthritis are also affecting patient's functional outcome.   REHAB POTENTIAL: Good  EVALUATION COMPLEXITY: Moderate  GOALS: Goals reviewed with patient? Yes  SHORT TERM GOALS: Target date: 4th OT Rx visit   Pt will demonstrate understanding of lymphedema precautions and prevention strategies with modified independence using a printed reference to identify at least 5 precautions and discussing how s/he may implement them into daily life to reduce risk of progression with extra time.  Baseline:Max A Goal status: GOAL MET  2.  Pt will be able to apply multilayer, knee length, gradient, compression wraps to one leg at a time with modified assistance (extra time and assistive device/s) to decrease limb volume, to limit infection risk, and to limit lymphedema progression.  Baseline: Dependent Goal status: GOAL MET  LONG TERM GOALS: Target date: 01/09/24 (12 weeks)  Given this patient's Intake score 36% on the functional outcomes FOTO tool, patient will experience an increase in function of 3 points to improve basic and instrumental ADLs performance, including lymphedema self-care.  Baseline: Max A Goal status: DEFERRED as Elizabeth Martin has been  discontinued  2.  Given this patient's Intake score of  75% on the Lymphedema Life Impact Scale (LLIS), patient will experience a reduction of at least 5 points in her perceived level of functional impairment resulting from lymphedema to improve functional performance and quality of life (QOL). Baseline: % Goal status: PROGRESSING  3.  Pt will achieve at least a 10% volume reduction in full, RLE limb volume to return limb to typical size and shape, to limit infection risk and LE progression, to decrease pain, to improve function. Baseline: Dependent Goal status:PROGRESSING. See chart   4.  Pt will obtain proper compression garments/devices and achieve modified independence (extra time + assistive devices) with donning/doffing to optimize limb volume reductions and limit LE  progression over time. Baseline:  Goal status: 01/04/24: PARTIALLY MET; In an effort to reduce burden of care on her spouse, Pt obtained adjustable, Velcro style R full leg, Circaid leg reduction kit for use s/p THA, but this proved not effective for controlling swelling, and were not easier to don and doff more independently. We've  resumed wrapping until she is able to fit back into custom compression garments. 02/14/24: Ongoing 04/11/24: Submitted new garment measurements to DME vendor for 3 piece system to address worsening lymphedema. Pt's insurance company denied coverage. She in the process of appealing.  5.  During Intensive phase CDT , with modified independence, Pt will achieve at least 85% compliance with all lymphedema self-care home program components, including daily skin care, compression wraps and /or garments, simple self MLD and lymphatic pumping therex to habituate LE self care protocol  into ADLs for optimal LE self-management over time. Baseline: Dependent Goal status: 02/14/24: GOAL MET and EXCEEDED PLAN:  OT FREQUENCY: 2-3 x/week  OT DURATION: 12 weeks and PRN  PLANNED INTERVENTIONS: Complete Decongestive  Therapy (Intensive and supported Self-Management Phases), 97110-Therapeutic exercises, 97530- Therapeutic activity, 97535- Self Care, 16109- Manual therapy, Patient/Family education, Taping, Manual lymph drainage, Scar mobilization, Compression bandaging, DME instructions, and skin care to reduce infection risk throughout manual therapy. Fit with replacement compression garments ASAP  PLAN FOR NEXT SESSION:  Cont MLD as established Cont multilayer compression wrapping   Arnold Bicker, MS, OTR/L, CLT-LANA 04/16/24 1:56 PM

## 2024-04-16 NOTE — Therapy (Signed)
 OUTPATIENT PHYSICAL THERAPY TREATMENT/Physical Therapy Progress Note   Dates of reporting period  02/15/2024   to   04/16/2024    Patient Name: Elizabeth Martin MRN: [id number] DOB:Sep 12, 1961, 64 y.o., female Today's Date: 04/16/2024  END OF SESSION:  PT End of Session - 04/16/24 1014     Visit Number 20    Number of Visits 35    Date for PT Re-Evaluation 06/04/24    Authorization Type TRIC[employer]    Progress Note Due on Visit 30    PT Start Time 1015    PT Stop Time 1057    PT Time Calculation (min) 42 min    Equipment Utilized During Treatment Gait belt    Activity Tolerance Patient limited by pain;Patient limited by fatigue    Behavior During Therapy [facility] for tasks assessed/performed                     Past Medical History:  Diagnosis Date   Anxiety    Asthma    Autoimmune disease (HCC)    Cancer (HCC)    Endometrial lymph node removal (30)   Corneal abrasion, right 05/24/2022   Depression    GERD (gastroesophageal reflux disease)    H/O blood clots    upper rt groin and behind both knees   History of hiatal hernia    Hypertension    Lymphedema    right leg   Morphea scleroderma    PONV (postoperative nausea and vomiting)    states gets violently ill   Past Surgical History:  Procedure Laterality Date   BASAL CELL CARCINOMA EXCISION Left    CHOLECYSTECTOMY  2008   FLEXIBLE SIGMOIDOSCOPY N/A 05/19/2022   Procedure: FLEXIBLE SIGMOIDOSCOPY;  Surgeon: Toledo, Alphonsus Jeans, MD;  Location: [facility] ENDOSCOPY;  Service: Gastroenterology;  Laterality: N/A;   HERNIA REPAIR  2004   ILEOSTOMY CLOSURE N/A 08/24/2022   Procedure: ILEOSTOMY TAKEDOWN, open loop with Apolonio Bay, PA-C to assist;  Surgeon: Emmalene Hare, MD;  Location: [facility] ORS;  Service: General;  Laterality: N/A;   IVC FILTER INSERTION N/A 11/09/2023   Procedure: IVC FILTER INSERTION;  Surgeon: Celso College, MD;  Location: [facility] INVASIVE CV LAB;  Service: Cardiovascular;  Laterality: N/A;   IVC FILTER  REMOVAL N/A 01/02/2024   Procedure: IVC FILTER REMOVAL;  Surgeon: Celso College, MD;  Location: [facility] IN[facility] CV [facility];  Service: Cardiovascular;  Laterality: N/A;   PLANTAR FASCIECTOMY     ROBOTIC ASSISTED TOTAL HYSTERECTOMY WITH BILATERAL SALPINGO OOPHERECTOMY  02/14/2012   ROTATOR CUFF REPAIR Left 03/24/2022   TONSILLECTOMY     TOTAL HIP ARTHROPLASTY Right 11/14/2023   Procedure: TOTAL HIP ARTHROPLASTY - posterior;  Surgeon: Venus Ginsberg, MD;  Location: [facility] ORS;  Service: Orthopedics;  Laterality: Right;   XI ROBOTIC ASSISTED LOWER ANTERIOR RESECTION N/A 05/24/2022   Procedure: XI ROBOTIC ASSISTED LOWER ANTERIOR RESECTION;  Surgeon: Emmalene Hare, MD;  Location: [facility] ORS;  Service: General;  Laterality: N/A;   Patient Active Problem List   Diagnosis Date Noted   Constipation 01/10/2024   Nausea & vomiting 01/10/2024   S/P total right hip arthroplasty 11/14/2023   Blood coagulation disorder (HCC) 11/10/2023   PTSD (post-traumatic stress disorder) 11/10/2023   Severe episode of recurrent major depressive disorder, without psychotic features (HCC) 11/10/2023   GAD (generalized anxiety disorder) 11/10/2023   High risk medication use 11/10/2023   Anxiety and depression 03/22/2023   Cervical radiculopathy 03/16/2023   Colon cancer screening 03/15/2023   Chronic  idiopathic constipation 03/15/2023   Dyspnea 03/07/2023   [facility] spotted fever 03/07/2023   Strain of gastrocnemius tendon 03/07/2023   Lower abdominal pain 03/07/2023   Pain in pelvis 03/07/2023   Small bowel obstruction (HCC) 02/11/2023   Raynaud disease 02/11/2023   Polyarthralgia 02/11/2023   Edema 01/03/2023   Diverticular disease 12/10/2022   Hot flashes 12/10/2022   Pain of right lower extremity 12/10/2022   Paresthesia of lower extremity 12/10/2022   Primary insomnia 12/10/2022   Thyroid  nodule 12/10/2022   Multiple joint pain 12/10/2022   Basal cell carcinoma of skin 12/10/2022   Acute bilateral low back  pain without sciatica 11/10/2022   Chronic lower back pain 09/05/2022   S/P closure of ileostomy 08/24/2022   Ileostomy in place [facility])    Morphea 07/13/2022   Colonic stricture (HCC) 05/19/2022   Large bowel obstruction (HCC) 05/19/2022   Hypokalemia 05/18/2022   Abdominal pain 05/18/2022   Osteoarthritis of left knee 02/24/2022   Mixed connective tissue disease (HCC) 02/17/2022   Adhesive capsulitis of left shoulder 01/27/2022   Glenohumeral arthritis, left 01/27/2022   Microscopic hematuria 01/12/2022   Otorrhagia of right ear 12/10/2021   Elevated testosterone level in female 11/04/2021   Anti-TPO antibodies present 10/20/2021   Rash 10/13/2021   Hashimoto thyroiditis, 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   Hashimoto's disease 03/23/2021   Exposure to severe acute respiratory syndrome coronavirus 2 (SARS-CoV-2) 12/19/2020   Migraine without aura and without status migrainosus, not intractable 08/19/2020   Myofascial pain 07/30/2020   Neck pain 07/30/2020   Upper back pain 07/30/2020   Cervical facet joint syndrome 07/30/2020   Hair loss 07/14/2020   Edema of lower extremity 07/11/2020   Adenomatous colon polyp 02/26/2020   Gastroesophageal reflux disease 02/15/2020   Diarrhea 02/15/2020   Sleep disorder 01/16/2020   Hyperlipidemia 07/11/2019   Right shoulder pain 04/04/2019   Pre-operative clearance 12/13/2018   Endometrial cancer (HCC) 04/05/2018   Patellofemoral pain syndrome of right knee 03/20/2018   Gastroenteritis 12/07/2017   Fatigue 11/16/2017   Morbid obesity (HCC) 08/11/2017   Stucco keratoses 08/11/2017   Essential hypertension 08/09/2017   History of endometrial cancer 08/09/2017   Lymphedema 08/09/2017   Menopause 08/09/2017   Malignant neoplastic disease (HCC) 04/07/2017   Cellulitis 09/18/2016   Benign hypertension 06/02/2016   Asthma 06/04/1969    PCP: Eather Golder MD   REFERRING PROVIDER: Eather Golder MD   REFERRING DIAG: s/p R hip  replacement  THERAPY DIAG:  Muscle weakness (generalized)  Stiffness of right hip, not elsewhere classified  Pain in right hip  Difficulty in walking, not elsewhere classified  Unsteadiness on feet  Rationale for Evaluation and Treatment: Rehabilitation  ONSET DATE: 11/14/23  SUBJECTIVE:   SUBJECTIVE STATEMENT:     Patient reports doing okay without increased pain from new stretches.   PERTINENT HISTORY: s/p R hip arthropaslasty 11/14/23 with Dr. Clyda Dark posterior approach, blood coagulation disorder, PTSD, anxiety, cervical radiculopathy dypsnea, polyarthralgia, Raynaud disease, s/p closure of ileostomy, Hashimoto thyroid , endometrial cancer, menopause, endometrial cancer, lymphedema. Patient had home health PT prior to PT here.   PAIN:  Are you having pain?  2/10 Rt posterior deep hip and medial posterior gluteals   PRECAUTIONS: Other: recovering from hip sx  RED FLAGS: None   WEIGHT BEARING RESTRICTIONS: No  FALLS:  Has patient fallen in last 6 months? Yes. Number of falls 2  LIVING ENVIRONMENT: Lives with: lives with their spouse Lives in: House/apartment Stairs: No Has following equipment at  home: Single point cane  OCCUPATION: retired,   PLOF: Independent  PATIENT GOALS: return to hiking, working out, gardening.   NEXT MD VISIT: March 2025   OBJECTIVE:  Note: Objective measures were completed at Evaluation unless otherwise noted.  DIAGNOSTIC FINDINGS:  IMPRESSION: "Right hip replacement.  No visible complicating feature."  PATIENT SURVEYS:  LEFS 5/80  COGNITION: Overall cognitive status: Within functional limits for tasks assessed    MUSCLE LENGTH: Hamstrings: limited bilaterally with R>L   LOWER EXTREMITY ROM:  Passive ROM Right  04/02/24 Right eval Left eval  Hip flexion >90 degrees 48 flexion PROM supine   Hip extension 0 degrees    Hip abduction ~25 degrees 5 degrees AROM     (Blank rows = not tested)  LOWER EXTREMITY MMT:  MMT  Right 04/02/24 Right eval Left eval  Hip flexion 4/5 2+ 5  Hip extension (supine 30 deg) 2-/5   5  Hip Hor ADD 5/5    Hip abduction 2+/5 2+*  5  Hip Hor ABDCT 4+/5    Hip adduction  2+ 5  Hip internal rotation 4+/5    Hip external rotation 3-/5     Knee flexion 5/5 3 5   Knee extension 4+/5 3 5   Ankle dorsiflexion 5/5 3 5   Ankle plantarflexion 5/5 3 5    (Blank rows = not tested)   FUNCTIONAL TESTS:  5 times sit to stand: 29 6 minute walk test:  10 meter walk test: 15 seconds with SPC   GAIT: Distance walked: 60 ft Assistive device utilized: Single point cane Level of assistance: CGA Comments: antalgic gait pattern with decreased stance time RLE. Slight vaulting pattern.  Stair negotiation: -step to pattern lead with LLE, one hand on cane one hand on rail ascending. Descending step to pattern down with RLE.                                                                                                                               TREATMENT DATE:  04/16/24  Physical therapy treatment session today consisted of completing assessment of goals and administration of testing as demonstrated and documented in flow sheet, treatment, and goals section of this note. Addition treatments may be found below.   Pt performed 5 time sit<>stand (5xSTS): 17.1 sec without UE support. (>15 sec indicates increased fall risk)    6 Min Walk Test:  Instructed patient to ambulate as quickly and as safely as possible for 6 minutes using LRAD. Patient was allowed to take standing rest breaks without stopping the test, but if the patient required a sitting rest break the clock would be stopped and the test would be over.  Results: 870 feet (265 meters, Avg speed 0.74 m/s) using no AD with SBA. Results indicate that the patient has reduced endurance with ambulation compared to age matched norms.  Age Matched Norms: 23-69 yo M: 25 F: 71, 40-79 yo M: 57 F: 471, 22-89 yo M:  417 F: 392 MDC: 58.21 meters  (190.98 feet) or 50 meters (ANPTA Core Set of Outcome Measures for Adults with Neurologic Conditions, 2018)   10 Meter Walk Test: Patient instructed to walk 10 meters (32.8 ft) as quickly and as safely as possible at their normal speed x2 and at a fast speed x2. Time measured from 2 meter mark to 8 meter mark to accommodate ramp-up and ramp-down.  Normal speed 1: 0.94 m/s Normal speed 2: 0.92 m/s Average Normal speed: 0.93 m/s Cut off scores: <0.4 m/s = household Ambulator, 0.4-0.8 m/s = limited community Ambulator, >0.8 m/s = community Ambulator, >1.2 m/s = crossing a street, <1.0 = increased fall risk MCID 0.05 m/s (small), 0.13 m/s (moderate), 0.06 m/s (significant)  (ANPTA Core Set of Outcome Measures for Adults with Neurologic Conditions, 2018)   Theract:   FWD walk in // bars- stepping over obstacles: cane, half foam, cane, half foam- x 10 without UE Support Side stepping in // bars- stepping over obstacles: cane, half foam, cane, half foam x 8 (each direction)  without UE Support  Therex: Standing hamstring stretch hold 30 sec x 3 each LE Standing hip flexor stretch hold 30 sec x 3 each LE        PATIENT EDUCATION:  Education details: exercise technique Person educated: Patient Education method: Explanation, Demonstration, Tactile cues, and Verbal cues Education comprehension: verbalized understanding, returned demonstration, verbal cues required, tactile cues required, and needs further education  HOME EXERCISE PROGRAM:  Access Code: [facility] URL: [email or web address] Date: 04/11/2024 Prepared by: Ferrell Hu  Exercises - Side Stepping with Counter Support  - 1 x daily - 7 x weekly - 2 sets - 10 reps - 5 hold - Standing Hip Extension  - 1 x daily - 7 x weekly - 2 sets - 10 reps - 5 hold - Standing March with Counter Support  - 1 x daily - 7 x weekly - 2 sets - 10 reps - 5 hold - Standing Hip Abduction with Counter Support  - 1 x daily - 7 x  weekly - 2 sets - 10 reps - Seated Hamstring Stretch  - 1 x daily - 7 x weekly - 2 sets - 2 reps - 30 hold - Sit to Stand  - 1 x daily - 7 x weekly - 2 sets - 10 reps - 5 hold - Supine Heel Slide  - 1 x daily - 7 x weekly - 2 sets - 6 reps - Supine Gluteal Sets  - 1 x daily - 7 x weekly - 2 sets - 10 reps - Supine Bridge  - 1 x daily - 7 x weekly - 3 sets - 5 reps - 2-3sec hold - Supine Short Arc Quad  - 1 x daily - 7 x weekly - 3 sets - 15 reps - Small Range Straight Leg Raise  - 1 x daily - 7 x weekly - 3 sets - 5 reps - Hooklying Clamshell with Resistance  - 1 x daily - 7 x weekly - 3 sets - 15 reps - Prone Hip Extension with Bent Knee  - 1 x daily - 7 x weekly - 3 sets - 10 reps - Prone Hip Extension Sequence  - 1 x daily - 7 x weekly - 3 sets - 10 reps - Supine Hamstring Stretch with Strap  - 1-2 x daily - 3 sets - 30sec hold - Modified Thomas Stretch  - 1-2 x daily - 3 sets - 30 sec  hold - Hip Flexor Stretch on Step  - 1-2 x daily - 3 sets - 30 sec hold - Standing Hamstring Stretch with Step  - 1 x daily - 7 x weekly - 3 sets - 30 sec hold - Supine Figure 4 Piriformis Stretch  - 1-2 x daily - 3 sets - 30 hold - Supine Piriformis Stretch  - 1-2 x daily - 3 sets - 30 sec hold        Access Code: [facility] URL: [email or web address] Date: 02/08/2024; updated 03/21/2024 Prepared by: [employer]  Exercises - Side Stepping with Counter Support  - 1 x daily - 7 x weekly - 2 sets - 10 reps - 5 hold - Standing Hip Extension  - 1 x daily - 7 x weekly - 2 sets - 10 reps - 5 hold - Standing March with Counter Support  - 1 x daily - 7 x weekly - 2 sets - 10 reps - 5 hold - Standing Hip Abduction with Counter Support  - 1 x daily - 7 x weekly - 2 sets - 10 reps - Seated Hamstring Stretch  - 1 x daily - 7 x weekly - 2 sets - 2 reps - 30 hold - Sit to Stand  - 1 x daily - 7 x weekly - 2 sets - 10 reps - 5 hold - Supine Heel Slide  - 1 x daily - 7 x weekly - 2  sets - 6 reps - Supine Gluteal Sets  - 1 x daily - 7 x weekly - 2 sets - 10 reps - Supine Bridge  - 1 x daily - 7 x weekly - 3 sets - 5 reps - 2-3sec hold - Supine Short Arc Quad  - 1 x daily - 7 x weekly - 3 sets - 15 reps - Small Range Straight Leg Raise  - 1 x daily - 7 x weekly - 3 sets - 5 reps - Hooklying Clamshell with Resistance  - 1 x daily - 7 x weekly - 3 sets - 15 reps - Prone Hip Extension with Bent Knee  - 1 x daily - 7 x weekly - 3 sets - 10 reps - Prone Hip Extension Sequence  - 1 x daily - 7 x weekly - 3 sets - 10 reps  ASSESSMENT: CLINICAL IMPRESSION:   Patient continues to present with progress as seen with all testing today. She was able to progress to performing goals without and AD so gait speed was slightly less and her endurance was challenged but she was able to walk for 6 min without an assistive device and denied any worsening pain- just fatigue. Her self perceived function did decline but again- patient is rating her ability without a device now. Patient's condition has the potential to improve in response to therapy. Maximum improvement is yet to be obtained. The anticipated improvement is attainable and reasonable in a generally predictable time.  Pt will continue to benefit from skilled therapy to address remaining deficits in order to improve overall QoL and return to PLOF.      OBJECTIVE IMPAIRMENTS: Abnormal gait, decreased activity tolerance, decreased balance, decreased coordination, decreased endurance, decreased mobility, difficulty walking, decreased ROM, decreased strength, hypomobility, increased edema, impaired perceived functional ability, impaired flexibility, improper body mechanics, postural dysfunction, and pain.   ACTIVITY LIMITATIONS: carrying, lifting, bending, sitting, standing, squatting, sleeping, stairs, transfers, bed mobility, bathing, toileting, dressing, reach over head, hygiene/grooming, locomotion level, and caring for  others  PARTICIPATION LIMITATIONS: meal prep, cleaning, laundry, interpersonal relationship, driving, shopping, community activity, and yard work  PERSONAL FACTORS: Age, Past/current experiences, Time since onset of injury/illness/exacerbation, and 3+ comorbidities: blood coagulation disorder, PTSD, anxiety, cervical radiculopathy dypsnea, polyarthralgia, Raynaud disease, s/p closure of ileostomy, Hashimoto thyroid , endometrial cancer, menopause, endometrial cancer, lymphedema are also affecting patient's functional outcome.   REHAB POTENTIAL: Good  CLINICAL DECISION MAKING: Evolving/moderate complexity  EVALUATION COMPLEXITY: Moderate   GOALS: Goals reviewed with patient? Yes  SHORT TERM GOALS: Target date: 01/19/2024  Patient will be independent in home exercise program to improve strength/mobility for better functional independence with ADLs. Baseline:1/30: HEP given 3/12: pt reports completing 3-4 days a week depending on soreness.  Goal status:  MET  LONG TERM GOALS: Target date: 06/04/2024  Patient (> 45 years old) will complete five times sit to stand test in < 10 seconds indicating an increased LE strength and improved balance. Baseline: 1/30: 29 seconds 3/12: 19.41 03/12/2024= 17.44 seconds- without UE support; 04/16/2024- 17.1 sec without UE support.  Goal status: PROGRESSING  2.  Patient will increase six minute walk test distance to >1000 for progression to community ambulator and improve gait ability Baseline: 430 ft in 5 min and required seated rest to end test due to fatigue, uses SPC 842ft with SPC and no standing or seated rest break; 03/12/2024= 950 feet with SPC; 04/16/2024= 870 without an AD Goal status: in progress   3.  Patient will increase 10 meter walk test to >1.32m/s as to improve gait speed for better community ambulation and to reduce fall risk. Baseline: 1/30: 15 seconds with SPC  Without SPC: 10.63 and 10.38 speed: 0.95 m/s; with SPC 10.98 and 11.18;  03/12/2024= 1.0 m/s; 04/16/2024= 0.93 m/s without SPC Goal status: PROGRESSING  4.  Patient will increase lower extremity functional scale to >60/80 to demonstrate improved functional mobility and increased tolerance with ADLs.  Baseline: 1/30: 5/80 3/12: 20/80; 03/12/2024= 29/80; 04/16/2024=13/80 Goal status: Progressing  PLAN:  PT FREQUENCY: 1-2x/week PT DURATION: 12 weeks PLANNED INTERVENTIONS: 97164- PT Re-evaluation, 97110-Therapeutic exercises, 97530- Therapeutic activity, 97112- Neuromuscular re-education, 97535- Self Care, 13086- Manual therapy, 97116- Gait training, [phone]- Splinting, 97014- Electrical stimulation (unattended), [phone]- Electrical stimulation (manual), 97035- Ultrasound, 28413- Traction (mechanical), Patient/Family education, Balance training, Stair training, Taping, Joint mobilization, Joint manipulation, Spinal manipulation, Spinal mobilization, Manual lymph drainage, Scar mobilization, Compression bandaging, Vestibular training, Visual/preceptual remediation/compensation, DME instructions, Cryotherapy, and Moist heat  PLAN FOR NEXT SESSION:   Continue with gentle hip stretching Progress with RLE strengthening  Continue with progressive Dynamic balance- tandem, narrowed BOS, SLS   12:19 PM, 04/16/24 Ossie Blend, PT Physical Therapist - [facility]     04/16/24, 12:19 PM

## 2024-04-17 ENCOUNTER — Ambulatory Visit: Admitting: Licensed Clinical Social Worker

## 2024-04-18 ENCOUNTER — Ambulatory Visit

## 2024-04-18 ENCOUNTER — Ambulatory Visit (INDEPENDENT_AMBULATORY_CARE_PROVIDER_SITE_OTHER): Admitting: Licensed Clinical Social Worker

## 2024-04-18 ENCOUNTER — Ambulatory Visit: Admitting: Occupational Therapy

## 2024-04-18 DIAGNOSIS — F3341 Major depressive disorder, recurrent, in partial remission: Secondary | ICD-10-CM

## 2024-04-18 DIAGNOSIS — I890 Lymphedema, not elsewhere classified: Secondary | ICD-10-CM | POA: Diagnosis not present

## 2024-04-18 DIAGNOSIS — R2681 Unsteadiness on feet: Secondary | ICD-10-CM

## 2024-04-18 DIAGNOSIS — M6281 Muscle weakness (generalized): Secondary | ICD-10-CM

## 2024-04-18 DIAGNOSIS — F4310 Post-traumatic stress disorder, unspecified: Secondary | ICD-10-CM

## 2024-04-18 DIAGNOSIS — R262 Difficulty in walking, not elsewhere classified: Secondary | ICD-10-CM

## 2024-04-18 DIAGNOSIS — M25551 Pain in right hip: Secondary | ICD-10-CM

## 2024-04-18 DIAGNOSIS — F411 Generalized anxiety disorder: Secondary | ICD-10-CM | POA: Diagnosis not present

## 2024-04-18 DIAGNOSIS — M25651 Stiffness of right hip, not elsewhere classified: Secondary | ICD-10-CM

## 2024-04-18 NOTE — Therapy (Signed)
 OUTPATIENT OCCUPATIONAL THERAPY TREATMENT NOTE  LOWER EXTREMITY LYMPHEDEMA  Patient Name: Elizabeth Martin MRN: [id number] DOB:Jul 12, 1961, 63 y.o., female Today's Date: 04/18/2024   END OF SESSION:  Lymphedema Episode 2   OT End of Session - 04/18/24 1401     Number of Visits 36    Date for OT Re-Evaluation 07/17/24    OT Start Time 1110    OT Stop Time 1210    OT Time Calculation (min) 60 min    Activity Tolerance Patient tolerated treatment well;No increased pain    Behavior During Therapy WFL for tasks assessed/performed              Past Medical History:  Diagnosis Date   Anxiety    Asthma    Autoimmune disease (HCC)    Cancer (HCC)    Endometrial lymph node removal (30)   Corneal abrasion, right 05/24/2022   Depression    GERD (gastroesophageal reflux disease)    H/O blood clots    upper rt groin and behind both knees   History of hiatal hernia    Hypertension    Lymphedema    right leg   Morphea scleroderma    PONV (postoperative nausea and vomiting)    states gets violently ill   Past Surgical History:  Procedure Laterality Date   BASAL CELL CARCINOMA EXCISION Left    CHOLECYSTECTOMY  2008   FLEXIBLE SIGMOIDOSCOPY N/A 05/19/2022   Procedure: FLEXIBLE SIGMOIDOSCOPY;  Surgeon: Toledo, Alphonsus Jeans, MD;  Location: [facility];  Service: Gastroenterology;  Laterality: N/A;   HERNIA REPAIR  2004   ILEOSTOMY CLOSURE N/A 08/24/2022   Procedure: ILEOSTOMY TAKEDOWN, open loop with Apolonio Bay, PA-C to assist;  Surgeon: Emmalene Hare, MD;  Location: [facility];  Service: General;  Laterality: N/A;   IVC FILTER INSERTION N/A 11/09/2023   Procedure: IVC FILTER INSERTION;  Surgeon: Celso College, MD;  Location: [facility];  Service: Cardiovascular;  Laterality: N/A;   IVC FILTER REMOVAL N/A 01/02/2024   Procedure: IVC FILTER REMOVAL;  Surgeon: Celso College, MD;  Location: [facility];  Service: Cardiovascular;  Laterality: N/A;   PLANTAR  FASCIECTOMY     ROBOTIC ASSISTED TOTAL HYSTERECTOMY WITH BILATERAL SALPINGO OOPHERECTOMY  02/14/2012   ROTATOR CUFF REPAIR Left 03/24/2022   TONSILLECTOMY     TOTAL HIP ARTHROPLASTY Right 11/14/2023   Procedure: TOTAL HIP ARTHROPLASTY - posterior;  Surgeon: Venus Ginsberg, MD;  Location: [facility];  Service: Orthopedics;  Laterality: Right;   XI ROBOTIC ASSISTED LOWER ANTERIOR RESECTION N/A 05/24/2022   Procedure: XI ROBOTIC ASSISTED LOWER ANTERIOR RESECTION;  Surgeon: Emmalene Hare, MD;  Location: [facility];  Service: General;  Laterality: N/A;   Patient Active Problem List   Diagnosis Date Noted   Constipation 01/10/2024   Nausea & vomiting 01/10/2024   S/P total right hip arthroplasty 11/14/2023   Blood coagulation disorder (HCC) 11/10/2023   PTSD (post-traumatic stress disorder) 11/10/2023   Severe episode of recurrent major depressive disorder, without psychotic features (HCC) 11/10/2023   GAD (generalized anxiety disorder) 11/10/2023   High risk medication use 11/10/2023   Anxiety and depression 03/22/2023   Cervical radiculopathy 03/16/2023   Colon cancer screening 03/15/2023   Chronic idiopathic constipation 03/15/2023   Dyspnea 03/07/2023   [facility] spotted fever 03/07/2023   Strain of gastrocnemius tendon 03/07/2023   Lower abdominal pain 03/07/2023   Pain in pelvis 03/07/2023   Small bowel obstruction (HCC) 02/11/2023   Raynaud disease 02/11/2023  Polyarthralgia 02/11/2023   Edema 01/03/2023   Diverticular disease 12/10/2022   Hot flashes 12/10/2022   Pain of right lower extremity 12/10/2022   Paresthesia of lower extremity 12/10/2022   Primary insomnia 12/10/2022   Thyroid  nodule 12/10/2022   Multiple joint pain 12/10/2022   Basal cell carcinoma of skin 12/10/2022   Acute bilateral low back pain without sciatica 11/10/2022   Chronic lower back pain 09/05/2022   S/P closure of ileostomy 08/24/2022   Ileostomy in place [facility])    Morphea 07/13/2022   Colonic  stricture (HCC) 05/19/2022   Large bowel obstruction (HCC) 05/19/2022   Hypokalemia 05/18/2022   Abdominal pain 05/18/2022   Osteoarthritis of left knee 02/24/2022   Mixed connective tissue disease (HCC) 02/17/2022   Adhesive capsulitis of left shoulder 01/27/2022   Glenohumeral arthritis, left 01/27/2022   Microscopic hematuria 01/12/2022   Otorrhagia of right ear 12/10/2021   Elevated testosterone level in female 11/04/2021   Anti-TPO antibodies present 10/20/2021   Rash 10/13/2021   Hashimoto thyroiditis, 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   Hashimoto's disease 03/23/2021   Exposure to severe acute respiratory syndrome coronavirus 2 (SARS-CoV-2) 12/19/2020   Migraine without aura and without status migrainosus, not intractable 08/19/2020   Myofascial pain 07/30/2020   Neck pain 07/30/2020   Upper back pain 07/30/2020   Cervical facet joint syndrome 07/30/2020   Hair loss 07/14/2020   Edema of lower extremity 07/11/2020   Adenomatous colon polyp 02/26/2020   Gastroesophageal reflux disease 02/15/2020   Diarrhea 02/15/2020   Sleep disorder 01/16/2020   Hyperlipidemia 07/11/2019   Right shoulder pain 04/04/2019   Pre-operative clearance 12/13/2018   Endometrial cancer (HCC) 04/05/2018   Patellofemoral pain syndrome of right knee 03/20/2018   Gastroenteritis 12/07/2017   Fatigue 11/16/2017   Morbid obesity (HCC) 08/11/2017   Stucco keratoses 08/11/2017   Essential hypertension 08/09/2017   History of endometrial cancer 08/09/2017   Lymphedema 08/09/2017   Menopause 08/09/2017   Malignant neoplastic disease (HCC) 04/07/2017   Cellulitis 09/18/2016   Benign hypertension 06/02/2016   Asthma 06/04/1969    PCP: Eather Golder, MD  REFERRING PROVIDER: Eather Golder, MD  REFERRING DIAG: I89.0   THERAPY DIAG:  Lymphedema, not elsewhere classified  Rationale for Evaluation and Treatment: Rehabilitation  ONSET DATE: 2013  (Cancer-related, endometrial 2013)  SUBJECTIVE:                                                                                                                                                                                            SUBJECTIVE STATEMENT:Patient returns to OT for lymphedema care to RLE/RLQ.  Pt rates LE-related pain at 3/10  today. Pt had PT before OT session today. Pt states she called DME vendor and left VM, but has had no return call.  OT emailed DME vendor a second time requesting she please return Pt's call. Pt states she has been working with her insurance company on getting coverage for garment order and wants to convey this info to DME vendor.  PERTINENT HISTORY: Asthma, Autoimmune Disease (Connective Tissue Disease), Endometrial Ca w/ LND ( 30 bilateral pelvic and periaortic LN), adjuvant chemotherapy and XRT,   H/O blood clots, RLE/RLQ lymphedema, Robotic assisted total hysterectomy w/ bilateral oophorectomy L Rotator Cuff repair, S/P ileostomy 2/2 colon stricture 05/2022; s/p R hip arthroplasty 11/11/23.   PAIN:  Are you having pain? YES. See subjective; 3/10 Pain location: RLE, R hip, cervical spine Pain description: sore, aching, heavy, full, tight,  Aggravating factors: standing, walking, extended dependent sitting Relieving factors: elevation, movement  PRECAUTIONS: Fall and Other: LYMPHEDEMA  WEIGHT BEARING RESTRICTIONS: Yes 5# lifting restriction- shoulder  FALLS:  Has patient fallen in last 6 months? No  LIVING ENVIRONMENT: Lives with: lives with their spouse Lives in: House/apartment Stairs: No;  Has following equipment at home: Single point cane, Environmental consultant - 2 wheeled, Environmental consultant - 4 wheeled, and Wheelchair (manual)  OCCUPATION: retired Control and instrumentation engineer: gardening, hiking, biking, dancing- unable to participate in any of these due to pain and swelling  HAND DOMINANCE: right   PRIOR LEVEL OF FUNCTION: Independent with basic ADLs, Independent with household mobility without device, Independent with community  mobility without device, Requires assistive device for independence, Needs assistance with homemaking, Needs assistance with transfers, and Leisure: decreased  social participation for leisure pursuits due to impaired mobility and pain  PATIENT GOALS:  Be able to move freely without pain Reduce limb volume to be able to lift limb for basic daily ADLs and functional ambulation Reduce limb volume to increase ability to perform lower body dressing and bathing and grooming Reduce limb to increase body image  OBJECTIVE: Note: Objective measures were completed at Evaluation unless otherwise noted.  COGNITION:  Overall cognitive status: Within functional limits for tasks assessed   OBSERVATIONS / OTHER ASSESSMENTS:   POSTURE: WFL  LE ROM: WFL, but Limited mildly at R knee and ankle due to girth, skin approximation, and joint pain  LE MMT: WFL  Mild, Stage  II, Bilateral Lower Extremity Lymphedema 2/2 CVI and Obesity  Skin  Description Hyper-Keratosis Peau d' Orange Shiny Tight Fibrotic/ Indurated Fatty Doughy Spongy/ boggy   x x x x R>L Severe morphea at  R groin obstructing lymphatics   x   Skin dry Flaky WNL Macerated   mildly sclerotic     Color Redness Varicosities Blanching Hemosiderin Stain Mottled   x x x   x   Odor Malodorous Yeast Fungal infection  WNL      x   Temperature Warm Cool wnl    x     Pitting Edema   1+ 2+ 3+ 4+ Non-pitting         x   Girth Symmetrical Asymmetrical                   Distribution    R>L RLE toes to groin    Stemmer Sign Positive Negative   +    Lymphorrhea History Of:  Present Absent     x    Wounds History Of Present Absent Venous Arterial Pressure Sheer     x  Signs of Infection Redness Warmth Erythema Acute Swelling Drainage Borders                    Sensation Light Touch Deep pressure Hypersensitivity   In tact Impaired In tact Impaired Absent Impaired   x  x  x     Nails WNL   Fungus nail dystrophy    x     Hair Growth Symmetrical Asymmetrical    R>L   Skin Creases Base of toes  Ankles   Base of Fingers knees       Abdominal pannus Thigh Lobules  Face/neck   x x  x        BLE COMPARATIVE LIMB VOLUMETRICS 10/10/23  LANDMARK RIGHT  10/10/23  R LEG (A-D) 5466.5 ml  R THIGH (E-G) 9186.6 ml  R FULL LIMB (A-G) 14653.1 ml  Limb Volume differential (LVD)  LVD for LEG = 44.1%, R>L LVD for THIGH= 33.98%, R>L LVD for FULL lower extremity 37.8%, R>L  Volume change since last 08/11/22 R LEG is INCREASED in volume by 59.6%. L THIGH is INCREASED by 38 %, and RLE full limb is INCREASED by 45.38%.  Volume change overall V  (Blank rows = not tested)  LANDMARK LEFT  10/12/23  L LEG (A-D) 3053.6 ml  L THIGH (E-G) 6064.8 ml  L  FULL LIMB (A-G) 9118.4 ml  Limb Volume differential (LVD)  %  Volume change since initial %  Volume change overall %  (Blank rows = not tested)   10 th VISIT PROGRESS NOTE RLE COMPARATIVE LIMB VOLUMETRICS 01/04/24: Deferred until next visit by Pt request.  LANDMARK RIGHT    R LEG (A-D) TBA  R THIGH (E-G) TBA  R FULL LIMB (A-G) TBA  Limb Volume differential (LVD)    Volume change since last 08/11/22 TBA  Volume change overall TBA  (Blank rows = not tested)  20 th VISIT PROGRESS NOTE RLE COMPARATIVE LIMB VOLUMETRICS TBA next session  LANDMARK RIGHT    R LEG (A-D) 6005.7 ml  R THIGH (E-G) 9400.3 ml  R FULL LIMB (A-G) 15406.02 ml  Limb Volume differential (LVD)    Volume change since last measured on 10/10/23 R LEG is INCREASED in volume by 9.9%. L THIGH is INCREASED by 2.32 %, and RLE full limb is INCREASED by 5.13% since last measured on 10/10/23.  Volume change since commencing OT for CDT %    28 th VISIT PROGRESS NOTE RLE COMPARATIVE LIMB VOLUMETRICS TBA next session  LANDMARK RIGHT    R LEG (A-D) 5936.1 ml  R THIGH (E-G) 8538.6  ml  R FULL LIMB (A-G) 14524.7  ml  Limb Volume differential (LVD)    Volume change since last measured on 10/10/23 R LEG is  DECREASED in volume by 1.2 %.  Since last measured on 03/14/24. R THIGH is DECREASED by 8.6 %, and RLE full limb is DECREASED by 5.72 % since last measured on 03/14/24.  Volume change since commencing OT for CDT %   ENDOMETRIAL CA-related LYMPHEDEMA Hx:  SURGERY TYPE/DATE: 2013 NUMBER OF LYMPH NODES REMOVED: 30 by report- bilateral pelvic and periaortic CHEMOTHERAPY: yes RADIATION:yes INFECTIONS: no hx cellulitis GAIT: Distance walked: >500 ft Assistive device utilized: single point cane Level of assistance: Modified independence- extra time Comments: R limp  LYMPHEDEMA LIFE IMPACT SCALE (LLIS): Intake 10/12/23 75% (The extent to which lymphedema related problems impacted your life over the past week)  FOTO (functional outcome measure):10/10/23 INTAKE: 36% No Out take  score. FOTO assessment discontinued by clinic.  PATIENT EDUCATION:  Education details: Continued Pt/ CG edu for lymphedema self care home program throughout session. Topics include outcome of comparative limb volumetrics- starting limb volume differentials (LVDs), technology and gradient techniques used for short stretch, multilayer compression wrapping, simple self-MLD, therapeutic lymphatic pumping exercises, skin/nail care, LE precautions,. compression garment recommendations and specifications, wear and care schedule and compression garment donning / doffing w assistive devices. Discussed progress towards all OT goals since commencing CDT. All questions answered to the Pt's satisfaction. Good return. Person educated: Patient Education method: Explanation, Demonstration, and Handouts Education comprehension: verbalized understanding, returned demonstration, and needs further education  LE SELF-CARE HOME  PROGRAM: Simple self-MLD/daily to affected quadrant and body part At least 2 x daily BLE Lymphatic Pumping There ex 1 set of 10 reps each, in order, bilaterally Daily skin care to affected body part to limit infection risk and  increase skin excursion Compression Bandaging Intensive stage compression: multilayer short stretch wraps with gradient techniques. One limb at a time. Length patient dependent. Self-management Phase: Appropriate daytime compression garment and hours-of-sleep device  Compression garments: Custom-made gradient compression garments and hours-of-sleep devices are medically necessary because they are uniquely sized and shaped to fit the exact dimensions of the affected extremities and to provide accurate and consistent gradient compression and containment, essential  for optimally managing chronic, progressive lymphedema. The convoluted HOS devices are medically necessary to facilitate increased lymphatic circulation and limit fibrosis formation when sleeping. Multiple custom compression garments are needed for optimal hygiene to limit infection risk. Custom compression garments should be replaced q 3-6 months When worn consistently for optimal lipo-lymphedema self-management over time.  ASSESSMENT:  CLINICAL IMPRESSION:  Continued RLE/RLQ MLD in side lying and supine as established working proximally to distal from neck, deep abdominal pathways, functional axillary LN, and J strokes  from thigh to toes. Rpt tolerated MLD and deeper fibrosis techniques without increased pain. At present RLE/RLQ is intractable to CDT.  Limb density and swelling are minimally reduced and softened since commencing CDT after hip replacement . Inflammation in limb   is stubborn and persistent in context of mixed connective tissue d disease. After MLD we Applied LLE multilayer compression wraps as established. Cont CDT 3 x weekly as per POC.  10/10/23 Lymphedema Episode 2, Initial OT Evaluation : Shakaila Mcmahon is a 16 y a female presenting with chronic, progressive, moderate, Stage II, RLE/RLQ cancer -related lymphedema with onset 11 years ago after Rx for endometrial cancer. Pt is well known to this therapist as she has  successfully undergone Complete Decongestive Therapy (CDT)  this clinic bin the past. Pt reports recent exacerbation of RLE swelling worsened as inflammation in L hip has worsened over time.  Pt has a hip replacement scheduled in late December here it [facility]. RE limb volumetrics today reveal that R LEG volume is dramatically increased by 59.65 since last visit. R LEG VOLUME is INCREASED in volume by 59.6%. L THIGH is INCREASED by 38 %, and RLE full limb is INCREASED by 45.38%. Pt presents with LE-related skin changes. Prolonged inflammation in the R hip joint has likely overloaded  already RLE/RLQ lymphatics.  RLE/RLQ lymphedema limits Pt's ability to perform basic and instrumental ADLs, including functional ambulation, mobility and transfers, grooming , lower body bathing and dressing, skin inspection and skin care. Pt has difficulty  reaching her lower legs and feet to apply compression wraps and don/doff        compression stockings. LE limits  ability to P[perform instrumental ADLs, including driving, yard work and home management activities. Lymphedema limits her ability to participate in leisure pursuits and productive activities, and it negatively impacts body image, life roles and quality of life.   Pt will benefit from an Intensive and follow along course of lymphedema. CDT will consist of Manual Lymphatic gainage   (MLD), skin care, therapeutic exercise and compression, wraps initially then garments. Pt will need assistance throughout CDT   for applying compression wraps due to limited hip AROM and decreased skin flexibility. Without skilled Occupational Therapy for lymphedema care, lymphedema will progress and further functional decline is expected.   In prep for upcoming hip replacement OT will educate Pt re assistive devices, including tub transfer bench and elevated toilet seat, in keeping with hip precautions.  OBJECTIVE IMPAIRMENTS: Abnormal gait, decreased activity tolerance, decreased balance,  decreased knowledge of use of DME, decreased mobility, difficulty walking, decreased ROM, decreased strength, increased edema, decreased skin flexibility, increased fascial restrictions, impaired sensation, pain, and chronic, progressive LLE/LLQ lymphatic swelling and associated pain.   ADL LIMITATIONS: carrying, lifting, bending, sitting, standing, squatting, sleeping, stairs, transfers, bed mobility, bathing, dressing, hygiene/grooming, and productive activities, leisure pursuits, social participation, body image driving, shopping, housework, yard work, cooking, meal prep  PERSONAL FACTORS: Past/current experiences and 3+ comorbidities: Morphea Scleroderma, Mixed connective tissue disease, and osteoarthritis are also affecting patient's functional outcome.   REHAB POTENTIAL: Good  EVALUATION COMPLEXITY: Moderate  GOALS: Goals reviewed with patient? Yes  SHORT TERM GOALS: Target date: 4th OT Rx visit   Pt will demonstrate understanding of lymphedema precautions and prevention strategies with modified independence using a printed reference to identify at least 5 precautions and discussing how s/he may implement them into daily life to reduce risk of progression with extra time.  Baseline:Max A Goal status: GOAL MET  2.  Pt will be able to apply multilayer, knee length, gradient, compression wraps to one leg at a time with modified assistance (extra time and assistive device/s) to decrease limb volume, to limit infection risk, and to limit lymphedema progression.  Baseline: Dependent Goal status: GOAL MET  LONG TERM GOALS: Target date: 01/09/24 (12 weeks)  Given this patient's Intake score 36% on the functional outcomes FOTO tool, patient will experience an increase in function of 3 points to improve basic and instrumental ADLs performance, including lymphedema self-care.  Baseline: Max A Goal status: DEFERRED as [employer] has been discontinued  2.  Given this patient's Intake score of  75% on  the Lymphedema Life Impact Scale (LLIS), patient will experience a reduction of at least 5 points in her perceived level of functional impairment resulting from lymphedema to improve functional performance and quality of life (QOL). Baseline: % Goal status: PROGRESSING  3.  Pt will achieve at least a 10% volume reduction in full, RLE limb volume to return limb to typical size and shape, to limit infection risk and LE progression, to decrease pain, to improve function. Baseline: Dependent Goal status:PROGRESSING. See chart   4.  Pt will obtain proper compression garments/devices and achieve modified independence (extra time + assistive devices) with donning/doffing to optimize limb volume reductions and limit LE  progression over time. Baseline:  Goal status: 01/04/24: PARTIALLY MET; In an effort to reduce burden of care on her spouse, Pt obtained adjustable, Velcro style R full leg, Circaid leg reduction kit for use s/p THA, but this proved not effective for controlling swelling, and were not easier to don and doff more independently. We've  resumed wrapping until she is able to fit back into custom compression garments. 02/14/24: Ongoing 04/11/24: Submitted new garment measurements to DME vendor for 3 piece system to address worsening lymphedema. Pt's insurance company denied coverage. She in the process of appealing.  5.  During Intensive phase CDT , with modified independence, Pt will achieve at least 85% compliance with all lymphedema self-care home program components, including daily skin care, compression wraps and /or garments, simple self MLD and lymphatic pumping therex to habituate LE self care protocol  into ADLs for optimal LE self-management over time. Baseline: Dependent Goal status: 02/14/24: GOAL MET and EXCEEDED PLAN:  OT FREQUENCY: 2-3 x/week  OT DURATION: 12 weeks and PRN  PLANNED INTERVENTIONS: Complete Decongestive Therapy (Intensive and supported Self-Management Phases),  97110-Therapeutic exercises, 97530- Therapeutic activity, 97535- Self Care, 16109- Manual therapy, Patient/Family education, Taping, Manual lymph drainage, Scar mobilization, Compression bandaging, DME instructions, and skin care to reduce infection risk throughout manual therapy. Fit with replacement compression garments ASAP  PLAN FOR NEXT SESSION:  Cont MLD as established Cont multilayer compression wrapping   Arnold Bicker, MS, OTR/L, CLT-LANA 04/18/24 2:03 PM

## 2024-04-18 NOTE — Progress Notes (Signed)
 THERAPIST PROGRESS NOTE  Session Time: 2:03pm-3pm  Participation Level: Active  Behavioral Response: CasualAlertEuthymic  Type of Therapy: Individual Therapy  Treatment Goals addressed:  Template: Depression (OP)                                                                                                          Goal: LTG: Reduce frequency, intensity, and duration of depression symptoms so that daily functioning is improved                                                                           Goal: STG: [place]ala will identify cognitive patterns and beliefs that support depression                                                                           Goal: LTG: Pt identifies the goal to Accept chronic pain and learning coping skills                                                                                             Template: PTSD (OP)                                                                                         Goal: LTG: Elimination of maladaptive behaviors and thinking patterns which interfere with resolution of trauma as evidenced by self report  Goal: LTG: Develop and implement effective coping skills to carry out normal responsibilities and participate constructively in relationships as evidenced by self report                                                                           Goal: LTG: Recall traumatic events without becoming overwhelmed with negative emotions                                                                           Goal: LTG: Pt reports "think about these things in the past without having such an impact on my life."            ProgressTowards Goals: Progressing  Interventions: Strength-based and Supportive  Summary: Elizabeth Martin is a 63 y.o. female who presents with symptoms of  depression and trauma. Patient identifies symptoms to include reexperiencing, uncontrollable worry, and hypervigilance. Pt was oriented times 5. Pt was cooperative and engaged. Pt denies SI/HI/AVH.   The patient utilized the therapeutic space to process her progress, stating that she feels she is returning to her old self. She reflected on a recent lunch with a friend and discussed her strategies for managing her expectations of others. The patient also mentioned improvements in her ability to give up control.   She recalled a text from her brother, which would have previously triggered a traumatic response. However, after undergoing EMDR therapy, she reported feeling excited because she "did not have a physical reaction." The patient noted that she hasn't experienced any major anxiety episodes lately, attributing this to changes in her medication. She expressed feeling "free" as a result.  Additionally, the patient reported that her peaceful mindset has led to better sleep. She reflected on a recent episode where, after waking from a bad dream, she successfully used bilateral tapping to emotionally regulate and return to sleep. Due to her progress, the clinician and the patient discussed the possibility of reducing the frequency of her sessions to once a month.  Suicidal/Homicidal: Nowithout intent/plan  Therapist Response: Clinician used active and supportive reflection to create a safe environment for patient to process recent life stressors. Clinician assessed for current symptoms, stressors, safety since last session.  Reflected on patient's progress and ways in which she is coping with current relationships and anxious symptoms.  Plan: Return again in 2 weeks.  Diagnosis: Recurrent major depressive disorder, in partial remission (HCC)  GAD (generalized anxiety disorder)  PTSD (post-traumatic stress disorder)   Collaboration of Care: AEB psychiatrist can access notes and cln. Will review  psychiatrists' notes. Check in with the patient and will see LCSW per availability. Patient agreed with treatment recommendations.   Patient/Guardian was advised Release of Information must be obtained prior to any record release in order to collaborate their care with an outside provider. Patient/Guardian was advised if they have not already done so to contact the registration department to sign all necessary forms in order for us   to release information regarding their care.   Consent: Patient/Guardian gives verbal consent for treatment and assignment of benefits for services provided during this visit. Patient/Guardian expressed understanding and agreed to proceed.   [name], LCSW 04/18/2024

## 2024-04-18 NOTE — Therapy (Signed)
 OUTPATIENT PHYSICAL THERAPY TREATMENT   Patient Name: Elizabeth Martin MRN: [id number] DOB:06/11/1961, 63 y.o., female Today's Date: 04/19/2024  END OF SESSION:  PT End of Session - 04/18/24 1021     Visit Number 21    Number of Visits 35    Date for PT Re-Evaluation 06/04/24    Authorization Type [employer]IC[employer]    Progress Note Due on Visit 30    PT Start Time 1015    PT Stop Time 1055    PT Time Calculation (min) 40 min    Equipment Utilized During Treatment Gait belt    Activity Tolerance Patient limited by pain;Patient limited by fatigue    Behavior During Therapy [facility] for tasks assessed/performed                     Past Medical History:  Diagnosis Date   Anxiety    Asthma    Autoimmune disease (HCC)    Cancer (HCC)    Endometrial lymph node removal (30)   Corneal abrasion, right 05/24/2022   Depression    GERD (gastroesophageal reflux disease)    H/O blood clots    upper rt groin and behind both knees   History of hiatal hernia    Hypertension    Lymphedema    right leg   Morphea scleroderma    PONV (postoperative nausea and vomiting)    states gets violently ill   Past Surgical History:  Procedure Laterality Date   BASAL CELL CARCINOMA EXCISION Left    CHOLECYSTECTOMY  2008   FLEXIBLE SIGMOIDOSCOPY N/A 05/19/2022   Procedure: FLEXIBLE SIGMOIDOSCOPY;  Surgeon: Toledo, Alphonsus Jeans, MD;  Location: [facility] ENDOSCOPY;  Service: Gastroenterology;  Laterality: N/A;   HERNIA REPAIR  2004   ILEOSTOMY CLOSURE N/A 08/24/2022   Procedure: ILEOSTOMY TAKEDOWN, open loop with Apolonio Bay, PA-C to assist;  Surgeon: Emmalene Hare, MD;  Location: [facility] ORS;  Service: General;  Laterality: N/A;   IVC FILTER INSERTION N/A 11/09/2023   Procedure: IVC FILTER INSERTION;  Surgeon: Celso College, MD;  Location: [facility] INVASIVE CV LAB;  Service: Cardiovascular;  Laterality: N/A;   IVC FILTER REMOVAL N/A 01/02/2024   Procedure: IVC FILTER REMOVAL;  Surgeon: Celso College, MD;   Location: [facility] INVASIVE CV LAB;  Service: Cardiovascular;  Laterality: N/A;   PLANTAR FASCIECTOMY     ROBOTIC ASSISTED TOTAL HYSTERECTOMY WITH BILATERAL SALPINGO OOPHERECTOMY  02/14/2012   ROTATOR CUFF REPAIR Left 03/24/2022   TONSILLECTOMY     TOTAL HIP ARTHROPLASTY Right 11/14/2023   Procedure: TOTAL HIP ARTHROPLASTY - posterior;  Surgeon: Venus Ginsberg, MD;  Location: [facility] ORS;  Service: Orthopedics;  Laterality: Right;   XI ROBOTIC ASSISTED LOWER ANTERIOR RESECTION N/A 05/24/2022   Procedure: XI ROBOTIC ASSISTED LOWER ANTERIOR RESECTION;  Surgeon: Emmalene Hare, MD;  Location: [facility] ORS;  Service: General;  Laterality: N/A;   Patient Active Problem List   Diagnosis Date Noted   Constipation 01/10/2024   Nausea & vomiting 01/10/2024   S/P total right hip arthroplasty 11/14/2023   Blood coagulation disorder (HCC) 11/10/2023   PTSD (post-traumatic stress disorder) 11/10/2023   Severe episode of recurrent major depressive disorder, without psychotic features (HCC) 11/10/2023   GAD (generalized anxiety disorder) 11/10/2023   High risk medication use 11/10/2023   Anxiety and depression 03/22/2023   Cervical radiculopathy 03/16/2023   Colon cancer screening 03/15/2023   Chronic idiopathic constipation 03/15/2023   Dyspnea 03/07/2023   [place] spotted fever 03/07/2023   Strain of  gastrocnemius tendon 03/07/2023   Lower abdominal pain 03/07/2023   Pain in pelvis 03/07/2023   Small bowel obstruction (HCC) 02/11/2023   Raynaud disease 02/11/2023   Polyarthralgia 02/11/2023   Edema 01/03/2023   Diverticular disease 12/10/2022   Hot flashes 12/10/2022   Pain of right lower extremity 12/10/2022   Paresthesia of lower extremity 12/10/2022   Primary insomnia 12/10/2022   Thyroid  nodule 12/10/2022   Multiple joint pain 12/10/2022   Basal cell carcinoma of skin 12/10/2022   Acute bilateral low back pain without sciatica 11/10/2022   Chronic lower back pain 09/05/2022   S/P closure  of ileostomy 08/24/2022   Ileostomy in place [facility])    Morphea 07/13/2022   Colonic stricture (HCC) 05/19/2022   Large bowel obstruction (HCC) 05/19/2022   Hypokalemia 05/18/2022   Abdominal pain 05/18/2022   Osteoarthritis of left knee 02/24/2022   Mixed connective tissue disease (HCC) 02/17/2022   Adhesive capsulitis of left shoulder 01/27/2022   Glenohumeral arthritis, left 01/27/2022   Microscopic hematuria 01/12/2022   Otorrhagia of right ear 12/10/2021   Elevated testosterone level in female 11/04/2021   Anti-TPO antibodies present 10/20/2021   Rash 10/13/2021   Hashimoto thyroiditis, 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   Hashimoto's disease 03/23/2021   Exposure to severe acute respiratory syndrome coronavirus 2 (SARS-CoV-2) 12/19/2020   Migraine without aura and without status migrainosus, not intractable 08/19/2020   Myofascial pain 07/30/2020   Neck pain 07/30/2020   Upper back pain 07/30/2020   Cervical facet joint syndrome 07/30/2020   Hair loss 07/14/2020   Edema of lower extremity 07/11/2020   Adenomatous colon polyp 02/26/2020   Gastroesophageal reflux disease 02/15/2020   Diarrhea 02/15/2020   Sleep disorder 01/16/2020   Hyperlipidemia 07/11/2019   Right shoulder pain 04/04/2019   Pre-operative clearance 12/13/2018   Endometrial cancer (HCC) 04/05/2018   Patellofemoral pain syndrome of right knee 03/20/2018   Gastroenteritis 12/07/2017   Fatigue 11/16/2017   Morbid obesity (HCC) 08/11/2017   Stucco keratoses 08/11/2017   Essential hypertension 08/09/2017   History of endometrial cancer 08/09/2017   Lymphedema 08/09/2017   Menopause 08/09/2017   Malignant neoplastic disease (HCC) 04/07/2017   Cellulitis 09/18/2016   Benign hypertension 06/02/2016   Asthma 06/04/1969    PCP: Eather Golder MD   REFERRING PROVIDER: Eather Golder MD   REFERRING DIAG: s/p R hip replacement  THERAPY DIAG:  Muscle weakness (generalized)  Stiffness of right hip, not elsewhere  classified  Pain in right hip  Difficulty in walking, not elsewhere classified  Unsteadiness on feet  Rationale for Evaluation and Treatment: Rehabilitation  ONSET DATE: 11/14/23  SUBJECTIVE:   SUBJECTIVE STATEMENT:     Patient reports sore from bending over yesterday trying to reach something.    PERTINENT HISTORY: s/p R hip arthropaslasty 11/14/23 with Dr. Clyda Dark posterior approach, blood coagulation disorder, PTSD, anxiety, cervical radiculopathy dypsnea, polyarthralgia, Raynaud disease, s/p closure of ileostomy, Hashimoto thyroid , endometrial cancer, menopause, endometrial cancer, lymphedema. Patient had home health PT prior to PT here.   PAIN:  Are you having pain?  2/10 Rt posterior deep hip and medial posterior gluteals   PRECAUTIONS: Other: recovering from hip sx  RED FLAGS: None   WEIGHT BEARING RESTRICTIONS: No  FALLS:  Has patient fallen in last 6 months? Yes. Number of falls 2  LIVING ENVIRONMENT: Lives with: lives with their spouse Lives in: House/apartment Stairs: No Has following equipment at home: Single point cane  OCCUPATION: retired,   PLOF: Independent  PATIENT GOALS: return to  hiking, working out, gardening.   NEXT MD VISIT: March 2025   OBJECTIVE:  Note: Objective measures were completed at Evaluation unless otherwise noted.  DIAGNOSTIC FINDINGS:  IMPRESSION: "Right hip replacement.  No visible complicating feature."  PATIENT SURVEYS:  LEFS 5/80  COGNITION: Overall cognitive status: Within functional limits for tasks assessed    MUSCLE LENGTH: Hamstrings: limited bilaterally with R>L   LOWER EXTREMITY ROM:  Passive ROM Right  04/02/24 Right eval Left eval  Hip flexion >90 degrees 48 flexion PROM supine   Hip extension 0 degrees    Hip abduction ~25 degrees 5 degrees AROM     (Blank rows = not tested)  LOWER EXTREMITY MMT:  MMT Right 04/02/24 Right eval Left eval  Hip flexion 4/5 2+ 5  Hip extension (supine 30 deg)  2-/5   5  Hip Hor ADD 5/5    Hip abduction 2+/5 2+*  5  Hip Hor ABDCT 4+/5    Hip adduction  2+ 5  Hip internal rotation 4+/5    Hip external rotation 3-/5     Knee flexion 5/5 3 5   Knee extension 4+/5 3 5   Ankle dorsiflexion 5/5 3 5   Ankle plantarflexion 5/5 3 5    (Blank rows = not tested)   FUNCTIONAL TESTS:  5 times sit to stand: 29 6 minute walk test:  10 meter walk test: 15 seconds with SPC   GAIT: Distance walked: 60 ft Assistive device utilized: Single point cane Level of assistance: CGA Comments: antalgic gait pattern with decreased stance time RLE. Slight vaulting pattern.  Stair negotiation: -step to pattern lead with LLE, one hand on cane one hand on rail ascending. Descending step to pattern down with RLE.                                                                                                                               TREATMENT DATE:  04/19/24  Therex: Standing hamstring stretch hold 30 sec x 3 each LE Standing hip flexor stretch hold 30 sec x 3 each LE   TA: Standing hip Swing (around small blue tube) - CW/CCW 2 x 15 reps each LE.  Seated LAQ (with intent to improve functional ability to hold RLE up for lymphedema dressing)- 3 sec x 12 reps   Resistive gait with 3# AW - 180 feet with short reciprocal    NMR: Heel to toe gait sequencing activity:  -stance leg with swing leg starting in toe postion then swing leg forward toward 1/2 foam with heel landing on floor and toe pad landing on 1/2 foam roll - 3 sets of 5 each LE      PATIENT EDUCATION:  Education details: exercise technique Person educated: Patient Education method: Explanation, Demonstration, Tactile cues, and Verbal cues Education comprehension: verbalized understanding, returned demonstration, verbal cues required, tactile cues required, and needs further education  HOME EXERCISE PROGRAM:  Access Code: [facility] URL: [email or web address] Date:  04/11/2024 Prepared  by: Ferrell Hu  Exercises - Side Stepping with Counter Support  - 1 x daily - 7 x weekly - 2 sets - 10 reps - 5 hold - Standing Hip Extension  - 1 x daily - 7 x weekly - 2 sets - 10 reps - 5 hold - Standing March with Counter Support  - 1 x daily - 7 x weekly - 2 sets - 10 reps - 5 hold - Standing Hip Abduction with Counter Support  - 1 x daily - 7 x weekly - 2 sets - 10 reps - Seated Hamstring Stretch  - 1 x daily - 7 x weekly - 2 sets - 2 reps - 30 hold - Sit to Stand  - 1 x daily - 7 x weekly - 2 sets - 10 reps - 5 hold - Supine Heel Slide  - 1 x daily - 7 x weekly - 2 sets - 6 reps - Supine Gluteal Sets  - 1 x daily - 7 x weekly - 2 sets - 10 reps - Supine Bridge  - 1 x daily - 7 x weekly - 3 sets - 5 reps - 2-3sec hold - Supine Short Arc Quad  - 1 x daily - 7 x weekly - 3 sets - 15 reps - Small Range Straight Leg Raise  - 1 x daily - 7 x weekly - 3 sets - 5 reps - Hooklying Clamshell with Resistance  - 1 x daily - 7 x weekly - 3 sets - 15 reps - Prone Hip Extension with Bent Knee  - 1 x daily - 7 x weekly - 3 sets - 10 reps - Prone Hip Extension Sequence  - 1 x daily - 7 x weekly - 3 sets - 10 reps - Supine Hamstring Stretch with Strap  - 1-2 x daily - 3 sets - 30sec hold - Modified Thomas Stretch  - 1-2 x daily - 3 sets - 30 sec hold - Hip Flexor Stretch on Step  - 1-2 x daily - 3 sets - 30 sec hold - Standing Hamstring Stretch with Step  - 1 x daily - 7 x weekly - 3 sets - 30 sec hold - Supine Figure 4 Piriformis Stretch  - 1-2 x daily - 3 sets - 30 hold - Supine Piriformis Stretch  - 1-2 x daily - 3 sets - 30 sec hold        Access Code: [facility] [place] URL: [email or web address] Date: 02/08/2024; updated 03/21/2024 Prepared by: [employer]  Exercises - Side Stepping with Counter Support  - 1 x daily - 7 x weekly - 2 sets - 10 reps - 5 hold - Standing Hip Extension  - 1 x daily - 7 x weekly - 2 sets - 10 reps - 5  hold - Standing March with Counter Support  - 1 x daily - 7 x weekly - 2 sets - 10 reps - 5 hold - Standing Hip Abduction with Counter Support  - 1 x daily - 7 x weekly - 2 sets - 10 reps - Seated Hamstring Stretch  - 1 x daily - 7 x weekly - 2 sets - 2 reps - 30 hold - Sit to Stand  - 1 x daily - 7 x weekly - 2 sets - 10 reps - 5 hold - Supine Heel Slide  - 1 x daily - 7 x weekly - 2 sets - 6 reps - Supine Gluteal Sets  - 1 x daily -  7 x weekly - 2 sets - 10 reps - Supine Bridge  - 1 x daily - 7 x weekly - 3 sets - 5 reps - 2-3sec hold - Supine Short Arc Quad  - 1 x daily - 7 x weekly - 3 sets - 15 reps - Small Range Straight Leg Raise  - 1 x daily - 7 x weekly - 3 sets - 5 reps - Hooklying Clamshell with Resistance  - 1 x daily - 7 x weekly - 3 sets - 15 reps - Prone Hip Extension with Bent Knee  - 1 x daily - 7 x weekly - 3 sets - 10 reps - Prone Hip Extension Sequence  - 1 x daily - 7 x weekly - 3 sets - 10 reps  ASSESSMENT: CLINICAL IMPRESSION:   Patient presents with good motivation. She presents with some fatigue with RLE with progressive weight bearing activities - improving with practice in terms on stability. No increased pain reported throughout session. Good understanding of Hip ROM/mobility to decreased stiffness.   Pt will continue to benefit from skilled therapy to address remaining deficits in order to improve overall QoL and return to PLOF.      OBJECTIVE IMPAIRMENTS: Abnormal gait, decreased activity tolerance, decreased balance, decreased coordination, decreased endurance, decreased mobility, difficulty walking, decreased ROM, decreased strength, hypomobility, increased edema, impaired perceived functional ability, impaired flexibility, improper body mechanics, postural dysfunction, and pain.   ACTIVITY LIMITATIONS: carrying, lifting, bending, sitting, standing, squatting, sleeping, stairs, transfers, bed mobility, bathing, toileting, dressing, reach over head,  hygiene/grooming, locomotion level, and caring for others  PARTICIPATION LIMITATIONS: meal prep, cleaning, laundry, interpersonal relationship, driving, shopping, community activity, and yard work  PERSONAL FACTORS: Age, Past/current experiences, Time since onset of injury/illness/exacerbation, and 3+ comorbidities: blood coagulation disorder, PTSD, anxiety, cervical radiculopathy dypsnea, polyarthralgia, Raynaud disease, s/p closure of ileostomy, Hashimoto thyroid , endometrial cancer, menopause, endometrial cancer, lymphedema are also affecting patient's functional outcome.   REHAB POTENTIAL: Good  CLINICAL DECISION MAKING: Evolving/moderate complexity  EVALUATION COMPLEXITY: Moderate   GOALS: Goals reviewed with patient? Yes  SHORT TERM GOALS: Target date: 01/19/2024  Patient will be independent in home exercise program to improve strength/mobility for better functional independence with ADLs. Baseline:1/30: HEP given 3/12: pt reports completing 3-4 days a week depending on soreness.  Goal status:  MET  LONG TERM GOALS: Target date: 06/04/2024  Patient (> 68 years old) will complete five times sit to stand test in < 10 seconds indicating an increased LE strength and improved balance. Baseline: 1/30: 29 seconds 3/12: 19.41 03/12/2024= 17.44 seconds- without UE support; 04/16/2024- 17.1 sec without UE support.  Goal status: PROGRESSING  2.  Patient will increase six minute walk test distance to >1000 for progression to community ambulator and improve gait ability Baseline: 430 ft in 5 min and required seated rest to end test due to fatigue, uses SPC 874ft with SPC and no standing or seated rest break; 03/12/2024= 950 feet with SPC; 04/16/2024= 870 without an AD Goal status: in progress   3.  Patient will increase 10 meter walk test to >1.45m/s as to improve gait speed for better community ambulation and to reduce fall risk. Baseline: 1/30: 15 seconds with SPC  Without SPC: 10.63 and 10.38  speed: 0.95 m/s; with SPC 10.98 and 11.18; 03/12/2024= 1.0 m/s; 04/16/2024= 0.93 m/s without SPC Goal status: PROGRESSING  4.  Patient will increase lower extremity functional scale to >60/80 to demonstrate improved functional mobility and increased tolerance with ADLs.  Baseline: 1/30: 5/80 3/12: 20/80; 03/12/2024= 29/80; 04/16/2024=13/80 Goal status: Progressing  PLAN:  PT FREQUENCY: 1-2x/week PT DURATION: 12 weeks PLANNED INTERVENTIONS: 97164- PT Re-evaluation, 97110-Therapeutic exercises, 97530- Therapeutic activity, 97112- Neuromuscular re-education, 97535- Self Care, 19147- Manual therapy, 97116- Gait training, [phone]- Splinting, 97014- Electrical stimulation (unattended), [phone]- Electrical stimulation (manual), 97035- Ultrasound, 65784- Traction (mechanical), Patient/Family education, Balance training, Stair training, Taping, Joint mobilization, Joint manipulation, Spinal manipulation, Spinal mobilization, Manual lymph drainage, Scar mobilization, Compression bandaging, Vestibular training, Visual/preceptual remediation/compensation, DME instructions, Cryotherapy, and Moist heat  PLAN FOR NEXT SESSION:   Continue with gentle hip stretching Progress with RLE strengthening  Continue with progressive Dynamic balance- tandem, narrowed BOS, SLS   4:36 PM, 04/19/24 Ossie Blend, PT Physical Therapist - [facility]     04/19/24, 4:36 PM

## 2024-04-20 ENCOUNTER — Encounter: Admitting: Occupational Therapy

## 2024-04-23 ENCOUNTER — Ambulatory Visit

## 2024-04-23 ENCOUNTER — Ambulatory Visit: Admitting: Occupational Therapy

## 2024-04-23 DIAGNOSIS — I890 Lymphedema, not elsewhere classified: Secondary | ICD-10-CM

## 2024-04-23 DIAGNOSIS — R262 Difficulty in walking, not elsewhere classified: Secondary | ICD-10-CM

## 2024-04-23 DIAGNOSIS — R2681 Unsteadiness on feet: Secondary | ICD-10-CM

## 2024-04-23 DIAGNOSIS — M25551 Pain in right hip: Secondary | ICD-10-CM

## 2024-04-23 DIAGNOSIS — M25651 Stiffness of right hip, not elsewhere classified: Secondary | ICD-10-CM

## 2024-04-23 DIAGNOSIS — M6281 Muscle weakness (generalized): Secondary | ICD-10-CM

## 2024-04-23 NOTE — Therapy (Signed)
 OUTPATIENT PHYSICAL THERAPY TREATMENT   Patient Name: Elizabeth Martin MRN: [id number] DOB:[birth year], 63 y.o., female Today's Date: 04/23/2024  END OF SESSION:  PT End of Session - 04/23/24 1021     Visit Number 22    Number of Visits 35    Date for PT Re-Evaluation 06/04/24    Authorization Type [employer]    Progress Note Due on Visit 30    PT Start Time 1015    PT Stop Time 1058    PT Time Calculation (min) 43 min    Equipment Utilized During Treatment Gait belt    Activity Tolerance Patient limited by pain;Patient limited by fatigue    Behavior During Therapy [facility] for tasks assessed/performed                     Past Medical History:  Diagnosis Date   Anxiety    Asthma    Autoimmune disease (HCC)    Cancer (HCC)    Endometrial lymph node removal (30)   Corneal abrasion, right 05/24/2022   Depression    GERD (gastroesophageal reflux disease)    H/O blood clots    upper rt groin and behind both knees   History of hiatal hernia    Hypertension    Lymphedema    right leg   Morphea scleroderma    PONV (postoperative nausea and vomiting)    states gets violently ill   Past Surgical History:  Procedure Laterality Date   BASAL CELL CARCINOMA EXCISION Left    CHOLECYSTECTOMY  2008   FLEXIBLE SIGMOIDOSCOPY N/A 05/19/2022   Procedure: FLEXIBLE SIGMOIDOSCOPY;  Surgeon: Toledo, Alphonsus Jeans, MD;  Location: [facility] ENDOSCOPY;  Service: Gastroenterology;  Laterality: N/A;   HERNIA REPAIR  2004   ILEOSTOMY CLOSURE N/A 08/24/2022   Procedure: ILEOSTOMY TAKEDOWN, open loop with Apolonio Bay, PA-C to assist;  Surgeon: Emmalene Hare, MD;  Location: [facility] ORS;  Service: General;  Laterality: N/A;   IVC FILTER INSERTION N/A 11/09/2023   Procedure: IVC FILTER INSERTION;  Surgeon: Celso College, MD;  Location: [facility] INVASIVE CV LAB;  Service: Cardiovascular;  Laterality: N/A;   IVC FILTER REMOVAL N/A 01/02/2024   Procedure: IVC FILTER REMOVAL;  Surgeon: Celso College, MD;   Location: [facility] INVASIVE CV LAB;  Service: Cardiovascular;  Laterality: N/A;   PLANTAR FASCIECTOMY     ROBOTIC ASSISTED TOTAL HYSTERECTOMY WITH BILATERAL SALPINGO OOPHERECTOMY  02/14/2012   ROTATOR CUFF REPAIR Left 03/24/2022   TONSILLECTOMY     TOTAL HIP ARTHROPLASTY Right 11/14/2023   Procedure: TOTAL HIP ARTHROPLASTY - posterior;  Surgeon: Venus Ginsberg, MD;  Location: [facility] ORS;  Service: Orthopedics;  Laterality: Right;   XI ROBOTIC ASSISTED LOWER ANTERIOR RESECTION N/A 05/24/2022   Procedure: XI ROBOTIC ASSISTED LOWER ANTERIOR RESECTION;  Surgeon: Emmalene Hare, MD;  Location: [facility] ORS;  Service: General;  Laterality: N/A;   Patient Active Problem List   Diagnosis Date Noted   Constipation 01/10/2024   Nausea & vomiting 01/10/2024   S/P total right hip arthroplasty 11/14/2023   Blood coagulation disorder (HCC) 11/10/2023   PTSD (post-traumatic stress disorder) 11/10/2023   Severe episode of recurrent major depressive disorder, without psychotic features (HCC) 11/10/2023   GAD (generalized anxiety disorder) 11/10/2023   High risk medication use 11/10/2023   Anxiety and depression 03/22/2023   Cervical radiculopathy 03/16/2023   Colon cancer screening 03/15/2023   Chronic idiopathic constipation 03/15/2023   Dyspnea 03/07/2023   [place] spotted fever 03/07/2023   Strain of  gastrocnemius tendon 03/07/2023   Lower abdominal pain 03/07/2023   Pain in pelvis 03/07/2023   Small bowel obstruction (HCC) 02/11/2023   Raynaud disease 02/11/2023   Polyarthralgia 02/11/2023   Edema 01/03/2023   Diverticular disease 12/10/2022   Hot flashes 12/10/2022   Pain of right lower extremity 12/10/2022   Paresthesia of lower extremity 12/10/2022   Primary insomnia 12/10/2022   Thyroid  nodule 12/10/2022   Multiple joint pain 12/10/2022   Basal cell carcinoma of skin 12/10/2022   Acute bilateral low back pain without sciatica 11/10/2022   Chronic lower back pain 09/05/2022   S/P closure  of ileostomy 08/24/2022   Ileostomy in place [facility])    Morphea 07/13/2022   Colonic stricture (HCC) 05/19/2022   Large bowel obstruction (HCC) 05/19/2022   Hypokalemia 05/18/2022   Abdominal pain 05/18/2022   Osteoarthritis of left knee 02/24/2022   Mixed connective tissue disease (HCC) 02/17/2022   Adhesive capsulitis of left shoulder 01/27/2022   Glenohumeral arthritis, left 01/27/2022   Microscopic hematuria 01/12/2022   Otorrhagia of right ear 12/10/2021   Elevated testosterone level in female 11/04/2021   Anti-TPO antibodies present 10/20/2021   Rash 10/13/2021   Hashimoto thyroiditis, 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   Hashimoto's disease 03/23/2021   Exposure to severe acute respiratory syndrome coronavirus 2 (SARS-CoV-2) 12/19/2020   Migraine without aura and without status migrainosus, not intractable 08/19/2020   Myofascial pain 07/30/2020   Neck pain 07/30/2020   Upper back pain 07/30/2020   Cervical facet joint syndrome 07/30/2020   Hair loss 07/14/2020   Edema of lower extremity 07/11/2020   Adenomatous colon polyp 02/26/2020   Gastroesophageal reflux disease 02/15/2020   Diarrhea 02/15/2020   Sleep disorder 01/16/2020   Hyperlipidemia 07/11/2019   Right shoulder pain 04/04/2019   Pre-operative clearance 12/13/2018   Endometrial cancer (HCC) 04/05/2018   Patellofemoral pain syndrome of right knee 03/20/2018   Gastroenteritis 12/07/2017   Fatigue 11/16/2017   Morbid obesity (HCC) 08/11/2017   Stucco keratoses 08/11/2017   Essential hypertension 08/09/2017   History of endometrial cancer 08/09/2017   Lymphedema 08/09/2017   Menopause 08/09/2017   Malignant neoplastic disease (HCC) 04/07/2017   Cellulitis 09/18/2016   Benign hypertension 06/02/2016   Asthma 06/04/1969    PCP: Eather Golder MD   REFERRING PROVIDER: Eather Golder MD   REFERRING DIAG: s/p R hip replacement  THERAPY DIAG:  Muscle weakness (generalized)  Stiffness of right hip, not elsewhere  classified  Pain in right hip  Difficulty in walking, not elsewhere classified  Unsteadiness on feet  Rationale for Evaluation and Treatment: Rehabilitation  ONSET DATE: 11/14/23  SUBJECTIVE:   SUBJECTIVE STATEMENT:     Patient reports sore again after busy weekend working outdoors.   PERTINENT HISTORY: s/p R hip arthropaslasty 11/14/23 with Dr. Clyda Dark posterior approach, blood coagulation disorder, PTSD, anxiety, cervical radiculopathy dypsnea, polyarthralgia, Raynaud disease, s/p closure of ileostomy, Hashimoto thyroid , endometrial cancer, menopause, endometrial cancer, lymphedema. Patient had home health PT prior to PT here.   PAIN:  Are you having pain?  2/10 Rt posterior deep hip and medial posterior gluteals   PRECAUTIONS: Other: recovering from hip sx  RED FLAGS: None   WEIGHT BEARING RESTRICTIONS: No  FALLS:  Has patient fallen in last 6 months? Yes. Number of falls 2  LIVING ENVIRONMENT: Lives with: lives with their spouse Lives in: House/apartment Stairs: No Has following equipment at home: Single point cane  OCCUPATION: retired,   PLOF: Independent  PATIENT GOALS: return to hiking, working out,  gardening.   NEXT MD VISIT: March 2025   OBJECTIVE:  Note: Objective measures were completed at Evaluation unless otherwise noted.  DIAGNOSTIC FINDINGS:  IMPRESSION: "Right hip replacement.  No visible complicating feature."  PATIENT SURVEYS:  LEFS 5/80  COGNITION: Overall cognitive status: Within functional limits for tasks assessed    MUSCLE LENGTH: Hamstrings: limited bilaterally with R>L   LOWER EXTREMITY ROM:  Passive ROM Right  04/02/24 Right eval Left eval  Hip flexion >90 degrees 48 flexion PROM supine   Hip extension 0 degrees    Hip abduction ~25 degrees 5 degrees AROM     (Blank rows = not tested)  LOWER EXTREMITY MMT:  MMT Right 04/02/24 Right eval Left eval  Hip flexion 4/5 2+ 5  Hip extension (supine 30 deg) 2-/5   5  Hip  Hor ADD 5/5    Hip abduction 2+/5 2+*  5  Hip Hor ABDCT 4+/5    Hip adduction  2+ 5  Hip internal rotation 4+/5    Hip external rotation 3-/5     Knee flexion 5/5 3 5   Knee extension 4+/5 3 5   Ankle dorsiflexion 5/5 3 5   Ankle plantarflexion 5/5 3 5    (Blank rows = not tested)   FUNCTIONAL TESTS:  5 times sit to stand: 29 6 minute walk test:  10 meter walk test: 15 seconds with SPC   GAIT: Distance walked: 60 ft Assistive device utilized: Single point cane Level of assistance: CGA Comments: antalgic gait pattern with decreased stance time RLE. Slight vaulting pattern.  Stair negotiation: -step to pattern lead with LLE, one hand on cane one hand on rail ascending. Descending step to pattern down with RLE.                                                                                                                               TREATMENT DATE:  04/23/24  Therex: Standing hamstring stretch hold 30 sec x 3 each LE Standing hip flexor stretch hold 30 sec x 3 each LE   TA:  Seated Leg swing (SLR) up and over 1/2 spike ball on top of 6" block- 3#AW 2 x 10 reps.  (Patient reports as hard on Right and Easy on left)   Sit to stand x 8 reps without UE support and LLE on airex pad to emphasize focus on RLE strength/power.   Seated LAQ (with intent to improve functional ability to hold RLE up for lymphedema dressing)- 3 sec x 10 reps   Lateral step up onto 6" block and then off on opp end- repeat in opp direction x 9 reps- Stopped due to fatigue and requested water .  Resistive gait with 3# AW - 180 feet with short reciprocal streps  Resistive fwd/bwd walking in 10 feet distance - counting steps (avg 6 steps fwd and 7-8 steps bwd)   Resistive lateral stepping using Matrix (12.5 #) x 3- Patient reports very hard.  PATIENT EDUCATION:  Education details: exercise technique Person educated: Patient Education method: Explanation, Demonstration, Tactile cues, and  Verbal cues Education comprehension: verbalized understanding, returned demonstration, verbal cues required, tactile cues required, and needs further education  HOME EXERCISE PROGRAM:  Access Code: [facility] URL: [email or web address] Date: 04/11/2024 Prepared by: Ferrell Hu  Exercises - Side Stepping with Counter Support  - 1 x daily - 7 x weekly - 2 sets - 10 reps - 5 hold - Standing Hip Extension  - 1 x daily - 7 x weekly - 2 sets - 10 reps - 5 hold - Standing March with Counter Support  - 1 x daily - 7 x weekly - 2 sets - 10 reps - 5 hold - Standing Hip Abduction with Counter Support  - 1 x daily - 7 x weekly - 2 sets - 10 reps - Seated Hamstring Stretch  - 1 x daily - 7 x weekly - 2 sets - 2 reps - 30 hold - Sit to Stand  - 1 x daily - 7 x weekly - 2 sets - 10 reps - 5 hold - Supine Heel Slide  - 1 x daily - 7 x weekly - 2 sets - 6 reps - Supine Gluteal Sets  - 1 x daily - 7 x weekly - 2 sets - 10 reps - Supine Bridge  - 1 x daily - 7 x weekly - 3 sets - 5 reps - 2-3sec hold - Supine Short Arc Quad  - 1 x daily - 7 x weekly - 3 sets - 15 reps - Small Range Straight Leg Raise  - 1 x daily - 7 x weekly - 3 sets - 5 reps - Hooklying Clamshell with Resistance  - 1 x daily - 7 x weekly - 3 sets - 15 reps - Prone Hip Extension with Bent Knee  - 1 x daily - 7 x weekly - 3 sets - 10 reps - Prone Hip Extension Sequence  - 1 x daily - 7 x weekly - 3 sets - 10 reps - Supine Hamstring Stretch with Strap  - 1-2 x daily - 3 sets - 30sec hold - Modified Thomas Stretch  - 1-2 x daily - 3 sets - 30 sec hold - Hip Flexor Stretch on Step  - 1-2 x daily - 3 sets - 30 sec hold - Standing Hamstring Stretch with Step  - 1 x daily - 7 x weekly - 3 sets - 30 sec hold - Supine Figure 4 Piriformis Stretch  - 1-2 x daily - 3 sets - 30 hold - Supine Piriformis Stretch  - 1-2 x daily - 3 sets - 30 sec hold        Access Code: [facility] URL: [email or web address] Date:  02/08/2024; updated 03/21/2024 Prepared by: Aurora [employer]  Exercises - Side Stepping with Counter Support  - 1 x daily - 7 x weekly - 2 sets - 10 reps - 5 hold - Standing Hip Extension  - 1 x daily - 7 x weekly - 2 sets - 10 reps - 5 hold - Standing March with Counter Support  - 1 x daily - 7 x weekly - 2 sets - 10 reps - 5 hold - Standing Hip Abduction with Counter Support  - 1 x daily - 7 x weekly - 2 sets - 10 reps - Seated Hamstring Stretch  - 1 x daily - 7 x weekly - 2 sets - 2 reps - 30 hold - Sit  to Stand  - 1 x daily - 7 x weekly - 2 sets - 10 reps - 5 hold - Supine Heel Slide  - 1 x daily - 7 x weekly - 2 sets - 6 reps - Supine Gluteal Sets  - 1 x daily - 7 x weekly - 2 sets - 10 reps - Supine Bridge  - 1 x daily - 7 x weekly - 3 sets - 5 reps - 2-3sec hold - Supine Short Arc Quad  - 1 x daily - 7 x weekly - 3 sets - 15 reps - Small Range Straight Leg Raise  - 1 x daily - 7 x weekly - 3 sets - 5 reps - Hooklying Clamshell with Resistance  - 1 x daily - 7 x weekly - 3 sets - 15 reps - Prone Hip Extension with Bent Knee  - 1 x daily - 7 x weekly - 3 sets - 10 reps - Prone Hip Extension Sequence  - 1 x daily - 7 x weekly - 3 sets - 10 reps  ASSESSMENT: CLINICAL IMPRESSION:   Patient presents with progressive RLE strength-working well with standing and seated activities. She was able to progress to resistive standing/balance activities without significant difficulty. She rated some activities as hard but able to complete without pain. Pt will continue to benefit from skilled therapy to address remaining deficits in order to improve overall QoL and return to PLOF.      OBJECTIVE IMPAIRMENTS: Abnormal gait, decreased activity tolerance, decreased balance, decreased coordination, decreased endurance, decreased mobility, difficulty walking, decreased ROM, decreased strength, hypomobility, increased edema, impaired perceived functional ability, impaired flexibility, improper  body mechanics, postural dysfunction, and pain.   ACTIVITY LIMITATIONS: carrying, lifting, bending, sitting, standing, squatting, sleeping, stairs, transfers, bed mobility, bathing, toileting, dressing, reach over head, hygiene/grooming, locomotion level, and caring for others  PARTICIPATION LIMITATIONS: meal prep, cleaning, laundry, interpersonal relationship, driving, shopping, community activity, and yard work  PERSONAL FACTORS: Age, Past/current experiences, Time since onset of injury/illness/exacerbation, and 3+ comorbidities: blood coagulation disorder, PTSD, anxiety, cervical radiculopathy dypsnea, polyarthralgia, Raynaud disease, s/p closure of ileostomy, Hashimoto thyroid , endometrial cancer, menopause, endometrial cancer, lymphedema are also affecting patient's functional outcome.   REHAB POTENTIAL: Good  CLINICAL DECISION MAKING: Evolving/moderate complexity  EVALUATION COMPLEXITY: Moderate   GOALS: Goals reviewed with patient? Yes  SHORT TERM GOALS: Target date: 01/19/2024  Patient will be independent in home exercise program to improve strength/mobility for better functional independence with ADLs. Baseline:1/30: HEP given 3/12: pt reports completing 3-4 days a week depending on soreness.  Goal status:  MET  LONG TERM GOALS: Target date: 06/04/2024  Patient (> 85 years old) will complete five times sit to stand test in < 10 seconds indicating an increased LE strength and improved balance. Baseline: 1/30: 29 seconds 3/12: 19.41 03/12/2024= 17.44 seconds- without UE support; 04/16/2024- 17.1 sec without UE support.  Goal status: PROGRESSING  2.  Patient will increase six minute walk test distance to >1000 for progression to community ambulator and improve gait ability Baseline: 430 ft in 5 min and required seated rest to end test due to fatigue, uses SPC 881ft with SPC and no standing or seated rest break; 03/12/2024= 950 feet with SPC; 04/16/2024= 870 without an AD Goal status:  in progress   3.  Patient will increase 10 meter walk test to >1.72m/s as to improve gait speed for better community ambulation and to reduce fall risk. Baseline: 1/30: 15 seconds with SPC  Without SPC: 10.63  and 10.38 speed: 0.95 m/s; with SPC 10.98 and 11.18; 03/12/2024= 1.0 m/s; 04/16/2024= 0.93 m/s without SPC Goal status: PROGRESSING  4.  Patient will increase lower extremity functional scale to >60/80 to demonstrate improved functional mobility and increased tolerance with ADLs.  Baseline: 1/30: 5/80 3/12: 20/80; 03/12/2024= 29/80; 04/16/2024=13/80 Goal status: Progressing  PLAN:  PT FREQUENCY: 1-2x/week PT DURATION: 12 weeks PLANNED INTERVENTIONS: 97164- PT Re-evaluation, 97110-Therapeutic exercises, 97530- Therapeutic activity, 97112- Neuromuscular re-education, 97535- Self Care, 16109- Manual therapy, 97116- Gait training, [id number]- Splinting, 97014- Electrical stimulation (unattended), [phone]- Electrical stimulation (manual), 97035- Ultrasound, 91478- Traction (mechanical), Patient/Family education, Balance training, Stair training, Taping, Joint mobilization, Joint manipulation, Spinal manipulation, Spinal mobilization, Manual lymph drainage, Scar mobilization, Compression bandaging, Vestibular training, Visual/preceptual remediation/compensation, DME instructions, Cryotherapy, and Moist heat  PLAN FOR NEXT SESSION:   Continue with gentle hip stretching Progress with RLE strengthening  Continue with progressive Dynamic balance- tandem, narrowed BOS, SLS   1:21 PM, 04/23/24 Ossie Blend, PT Physical Therapist - [facility]     04/23/24, 1:21 PM

## 2024-04-23 NOTE — Therapy (Signed)
 OUTPATIENT OCCUPATIONAL THERAPY TREATMENT NOTE  LOWER EXTREMITY LYMPHEDEMA  Patient Name: Elizabeth Martin MRN: [id number] DOB:June 06, 1961, 63 y.o., female Today's Date: 04/23/2024   END OF SESSION:  Lymphedema Episode 2   OT End of Session - 04/23/24 1241     Visit Number 33    Number of Visits 36    Date for OT Re-Evaluation 07/17/24    OT Start Time 1100    OT Stop Time 1210    OT Time Calculation (min) 70 min    Activity Tolerance Patient tolerated treatment well;No increased pain    Behavior During Therapy WFL for tasks assessed/performed              Past Medical History:  Diagnosis Date   Anxiety    Asthma    Autoimmune disease (HCC)    Cancer (HCC)    Endometrial lymph node removal (30)   Corneal abrasion, right 05/24/2022   Depression    GERD (gastroesophageal reflux disease)    H/O blood clots    upper rt groin and behind both knees   History of hiatal hernia    Hypertension    Lymphedema    right leg   Morphea scleroderma    PONV (postoperative nausea and vomiting)    states gets violently ill   Past Surgical History:  Procedure Laterality Date   BASAL CELL CARCINOMA EXCISION Left    CHOLECYSTECTOMY  2008   FLEXIBLE SIGMOIDOSCOPY N/A 05/19/2022   Procedure: FLEXIBLE SIGMOIDOSCOPY;  Surgeon: Toledo, Alphonsus Jeans, MD;  Location: [facility];  Service: Gastroenterology;  Laterality: N/A;   HERNIA REPAIR  2004   ILEOSTOMY CLOSURE N/A 08/24/2022   Procedure: ILEOSTOMY TAKEDOWN, open loop with Apolonio Bay, PA-C to assist;  Surgeon: Emmalene Hare, MD;  Location: [facility];  Service: General;  Laterality: N/A;   IVC FILTER INSERTION N/A 11/09/2023   Procedure: IVC FILTER INSERTION;  Surgeon: Celso College, MD;  Location: [facility];  Service: Cardiovascular;  Laterality: N/A;   IVC FILTER REMOVAL N/A 01/02/2024   Procedure: IVC FILTER REMOVAL;  Surgeon: Celso College, MD;  Location: [facility];  Service: Cardiovascular;  Laterality:  N/A;   PLANTAR FASCIECTOMY     ROBOTIC ASSISTED TOTAL HYSTERECTOMY WITH BILATERAL SALPINGO OOPHERECTOMY  02/14/2012   ROTATOR CUFF REPAIR Left 03/24/2022   TONSILLECTOMY     TOTAL HIP ARTHROPLASTY Right 11/14/2023   Procedure: TOTAL HIP ARTHROPLASTY - posterior;  Surgeon: Venus Ginsberg, MD;  Location: [facility];  Service: Orthopedics;  Laterality: Right;   XI ROBOTIC ASSISTED LOWER ANTERIOR RESECTION N/A 05/24/2022   Procedure: XI ROBOTIC ASSISTED LOWER ANTERIOR RESECTION;  Surgeon: Emmalene Hare, MD;  Location: [facility];  Service: General;  Laterality: N/A;   Patient Active Problem List   Diagnosis Date Noted   Constipation 01/10/2024   Nausea & vomiting 01/10/2024   S/P total right hip arthroplasty 11/14/2023   Blood coagulation disorder (HCC) 11/10/2023   PTSD (post-traumatic stress disorder) 11/10/2023   Severe episode of recurrent major depressive disorder, without psychotic features (HCC) 11/10/2023   GAD (generalized anxiety disorder) 11/10/2023   High risk medication use 11/10/2023   Anxiety and depression 03/22/2023   Cervical radiculopathy 03/16/2023   Colon cancer screening 03/15/2023   Chronic idiopathic constipation 03/15/2023   Dyspnea 03/07/2023   [facility] spotted fever 03/07/2023   Strain of gastrocnemius tendon 03/07/2023   Lower abdominal pain 03/07/2023   Pain in pelvis 03/07/2023   Small bowel obstruction (HCC) 02/11/2023  Raynaud disease 02/11/2023   Polyarthralgia 02/11/2023   Edema 01/03/2023   Diverticular disease 12/10/2022   Hot flashes 12/10/2022   Pain of right lower extremity 12/10/2022   Paresthesia of lower extremity 12/10/2022   Primary insomnia 12/10/2022   Thyroid  nodule 12/10/2022   Multiple joint pain 12/10/2022   Basal cell carcinoma of skin 12/10/2022   Acute bilateral low back pain without sciatica 11/10/2022   Chronic lower back pain 09/05/2022   S/P closure of ileostomy 08/24/2022   Ileostomy in place [facility])    Morphea  07/13/2022   Colonic stricture (HCC) 05/19/2022   Large bowel obstruction (HCC) 05/19/2022   Hypokalemia 05/18/2022   Abdominal pain 05/18/2022   Osteoarthritis of left knee 02/24/2022   Mixed connective tissue disease (HCC) 02/17/2022   Adhesive capsulitis of left shoulder 01/27/2022   Glenohumeral arthritis, left 01/27/2022   Microscopic hematuria 01/12/2022   Otorrhagia of right ear 12/10/2021   Elevated testosterone level in female 11/04/2021   Anti-TPO antibodies present 10/20/2021   Rash 10/13/2021   Hashimoto thyroiditis, 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   Hashimoto's disease 03/23/2021   Exposure to severe acute respiratory syndrome coronavirus 2 (SARS-CoV-2) 12/19/2020   Migraine without aura and without status migrainosus, not intractable 08/19/2020   Myofascial pain 07/30/2020   Neck pain 07/30/2020   Upper back pain 07/30/2020   Cervical facet joint syndrome 07/30/2020   Hair loss 07/14/2020   Edema of lower extremity 07/11/2020   Adenomatous colon polyp 02/26/2020   Gastroesophageal reflux disease 02/15/2020   Diarrhea 02/15/2020   Sleep disorder 01/16/2020   Hyperlipidemia 07/11/2019   Right shoulder pain 04/04/2019   Pre-operative clearance 12/13/2018   Endometrial cancer (HCC) 04/05/2018   Patellofemoral pain syndrome of right knee 03/20/2018   Gastroenteritis 12/07/2017   Fatigue 11/16/2017   Morbid obesity (HCC) 08/11/2017   Stucco keratoses 08/11/2017   Essential hypertension 08/09/2017   History of endometrial cancer 08/09/2017   Lymphedema 08/09/2017   Menopause 08/09/2017   Malignant neoplastic disease (HCC) 04/07/2017   Cellulitis 09/18/2016   Benign hypertension 06/02/2016   Asthma 06/04/1969    PCP: Eather Golder, MD  REFERRING PROVIDER: Eather Golder, MD  REFERRING DIAG: I89.0   THERAPY DIAG:  Lymphedema, not elsewhere classified  Rationale for Evaluation and Treatment: Rehabilitation  ONSET DATE: 2013  (Cancer-related, endometrial  2013)  SUBJECTIVE:                                                                                                                                                                                           SUBJECTIVE STATEMENT:Patient returns to OT for lymphedema care to RLE/RLQ.  Pt  rates LE-related pain at 3/10 today. Pt had PT before OT session today. Pt reports she is getting conflicting information on custom compression garment coverage from her insurance provider and DME vendor. Pt states she is feeling frustrated and will have to wait until July to purchase garments when her Medicare disability status is activated. Pt has been a strong self advocate throughout this process.  PERTINENT HISTORY: Asthma, Autoimmune Disease (Connective Tissue Disease), Endometrial Ca w/ LND ( 30 bilateral pelvic and periaortic LN), adjuvant chemotherapy and XRT,   H/O blood clots, RLE/RLQ lymphedema, Robotic assisted total hysterectomy w/ bilateral oophorectomy L Rotator Cuff repair, S/P ileostomy 2/2 colon stricture 05/2022; s/p R hip arthroplasty 11/11/23.   PAIN:  Are you having pain? YES. See subjective; 3/10 Pain location: RLE, R hip, cervical spine Pain description: sore, aching, heavy, full, tight,  Aggravating factors: standing, walking, extended dependent sitting Relieving factors: elevation, movement  PRECAUTIONS: Fall and Other: LYMPHEDEMA  WEIGHT BEARING RESTRICTIONS: Yes 5# lifting restriction- shoulder  FALLS:  Has patient fallen in last 6 months? No  LIVING ENVIRONMENT: Lives with: lives with their spouse Lives in: House/apartment Stairs: No;  Has following equipment at home: Single point cane, Environmental consultant - 2 wheeled, Environmental consultant - 4 wheeled, and Wheelchair (manual)  OCCUPATION: retired Control and instrumentation engineer: gardening, hiking, biking, dancing- unable to participate in any of these due to pain and swelling  HAND DOMINANCE: right   PRIOR LEVEL OF FUNCTION: Independent with basic ADLs, Independent  with household mobility without device, Independent with community mobility without device, Requires assistive device for independence, Needs assistance with homemaking, Needs assistance with transfers, and Leisure: decreased  social participation for leisure pursuits due to impaired mobility and pain  PATIENT GOALS:  Be able to move freely without pain Reduce limb volume to be able to lift limb for basic daily ADLs and functional ambulation Reduce limb volume to increase ability to perform lower body dressing and bathing and grooming Reduce limb to increase body image  OBJECTIVE: Note: Objective measures were completed at Evaluation unless otherwise noted.  COGNITION:  Overall cognitive status: Within functional limits for tasks assessed   OBSERVATIONS / OTHER ASSESSMENTS:   POSTURE: WFL  LE ROM: WFL, but Limited mildly at R knee and ankle due to girth, skin approximation, and joint pain  LE MMT: WFL  Mild, Stage  II, Bilateral Lower Extremity Lymphedema 2/2 CVI and Obesity  Skin  Description Hyper-Keratosis Peau d' Orange Shiny Tight Fibrotic/ Indurated Fatty Doughy Spongy/ boggy   x x x x R>L Severe morphea at  R groin obstructing lymphatics   x   Skin dry Flaky WNL Macerated   mildly sclerotic     Color Redness Varicosities Blanching Hemosiderin Stain Mottled   x x x   x   Odor Malodorous Yeast Fungal infection  WNL      x   Temperature Warm Cool wnl    x     Pitting Edema   1+ 2+ 3+ 4+ Non-pitting         x   Girth Symmetrical Asymmetrical                   Distribution    R>L RLE toes to groin    Stemmer Sign Positive Negative   +    Lymphorrhea History Of:  Present Absent     x    Wounds History Of Present Absent Venous Arterial Pressure Sheer     x  Signs of Infection Redness Warmth Erythema Acute Swelling Drainage Borders                    Sensation Light Touch Deep pressure Hypersensitivity   In tact Impaired In tact Impaired  Absent Impaired   x  x  x     Nails WNL   Fungus nail dystrophy   x     Hair Growth Symmetrical Asymmetrical    R>L   Skin Creases Base of toes  Ankles   Base of Fingers knees       Abdominal pannus Thigh Lobules  Face/neck   x x  x        BLE COMPARATIVE LIMB VOLUMETRICS 10/10/23  LANDMARK RIGHT  10/10/23  R LEG (A-D) 5466.5 ml  R THIGH (E-G) 9186.6 ml  R FULL LIMB (A-G) 14653.1 ml  Limb Volume differential (LVD)  LVD for LEG = 44.1%, R>L LVD for THIGH= 33.98%, R>L LVD for FULL lower extremity 37.8%, R>L  Volume change since last 08/11/22 R LEG is INCREASED in volume by 59.6%. L THIGH is INCREASED by 38 %, and RLE full limb is INCREASED by 45.38%.  Volume change overall V  (Blank rows = not tested)  LANDMARK LEFT  10/12/23  L LEG (A-D) 3053.6 ml  L THIGH (E-G) 6064.8 ml  L  FULL LIMB (A-G) 9118.4 ml  Limb Volume differential (LVD)  %  Volume change since initial %  Volume change overall %  (Blank rows = not tested)   10 th VISIT PROGRESS NOTE RLE COMPARATIVE LIMB VOLUMETRICS 01/04/24: Deferred until next visit by Pt request.  LANDMARK RIGHT    R LEG (A-D) TBA  R THIGH (E-G) TBA  R FULL LIMB (A-G) TBA  Limb Volume differential (LVD)    Volume change since last 08/11/22 TBA  Volume change overall TBA  (Blank rows = not tested)  20 th VISIT PROGRESS NOTE RLE COMPARATIVE LIMB VOLUMETRICS TBA next session  LANDMARK RIGHT    R LEG (A-D) 6005.7 ml  R THIGH (E-G) 9400.3 ml  R FULL LIMB (A-G) 15406.02 ml  Limb Volume differential (LVD)    Volume change since last measured on 10/10/23 R LEG is INCREASED in volume by 9.9%. L THIGH is INCREASED by 2.32 %, and RLE full limb is INCREASED by 5.13% since last measured on 10/10/23.  Volume change since commencing OT for CDT %    28 th VISIT PROGRESS NOTE RLE COMPARATIVE LIMB VOLUMETRICS TBA next session  LANDMARK RIGHT    R LEG (A-D) 5936.1 ml  R THIGH (E-G) 8538.6  ml  R FULL LIMB (A-G) 14524.7  ml  Limb Volume  differential (LVD)    Volume change since last measured on 10/10/23 R LEG is DECREASED in volume by 1.2 %.  Since last measured on 03/14/24. R THIGH is DECREASED by 8.6 %, and RLE full limb is DECREASED by 5.72 % since last measured on 03/14/24.  Volume change since commencing OT for CDT %   ENDOMETRIAL CA-related LYMPHEDEMA Hx:  SURGERY TYPE/DATE: 2013 NUMBER OF LYMPH NODES REMOVED: 30 by report- bilateral pelvic and periaortic CHEMOTHERAPY: yes RADIATION:yes INFECTIONS: no hx cellulitis GAIT: Distance walked: >500 ft Assistive device utilized: single point cane Level of assistance: Modified independence- extra time Comments: R limp  LYMPHEDEMA LIFE IMPACT SCALE (LLIS): Intake 10/12/23 75% (The extent to which lymphedema related problems impacted your life over the past week)  FOTO (functional outcome measure):10/10/23 INTAKE: 36% No Out take  score. FOTO assessment discontinued by clinic.  PATIENT EDUCATION:  Education details: Continued Pt/ CG edu for lymphedema self care home program throughout session. Topics include outcome of comparative limb volumetrics- starting limb volume differentials (LVDs), technology and gradient techniques used for short stretch, multilayer compression wrapping, simple self-MLD, therapeutic lymphatic pumping exercises, skin/nail care, LE precautions,. compression garment recommendations and specifications, wear and care schedule and compression garment donning / doffing w assistive devices. Discussed progress towards all OT goals since commencing CDT. All questions answered to the Pt's satisfaction. Good return. Person educated: Patient Education method: Explanation, Demonstration, and Handouts Education comprehension: verbalized understanding, returned demonstration, and needs further education  LE SELF-CARE HOME  PROGRAM: Simple self-MLD/daily to affected quadrant and body part At least 2 x daily BLE Lymphatic Pumping There ex 1 set of 10 reps each, in order,  bilaterally Daily skin care to affected body part to limit infection risk and increase skin excursion Compression Bandaging Intensive stage compression: multilayer short stretch wraps with gradient techniques. One limb at a time. Length patient dependent. Self-management Phase: Appropriate daytime compression garment and hours-of-sleep device  Compression garments: Custom-made gradient compression garments and hours-of-sleep devices are medically necessary because they are uniquely sized and shaped to fit the exact dimensions of the affected extremities and to provide accurate and consistent gradient compression and containment, essential  for optimally managing chronic, progressive lymphedema. The convoluted HOS devices are medically necessary to facilitate increased lymphatic circulation and limit fibrosis formation when sleeping. Multiple custom compression garments are needed for optimal hygiene to limit infection risk. Custom compression garments should be replaced q 3-6 months When worn consistently for optimal lipo-lymphedema self-management over time.  ASSESSMENT:  CLINICAL IMPRESSION:  Continued RLE/RLQ MLD in side lying and supine as established working proximally to distal from neck, deep abdominal pathways, functional axillary LN, and J strokes  from thigh to toes. Rpt tolerated MLD and deeper fibrosis techniques without increased pain. At present RLE/RLQ is intractable to CDT.  Limb density and swelling are minimally reduced and softened since commencing CDT after hip replacement . Inflammation in limb   is stubborn and persistent in context of mixed connective tissue d disease. After MLD we Applied LLE multilayer compression wraps as established. Continue to support Pt in her efforts to obtain medically necessary custom compression garments and devices. Cont CDT 3 x weekly as per POC.  10/10/23 Lymphedema Episode 2, Initial OT Evaluation : Elizabeth Martin is a 85 y a female presenting with  chronic, progressive, moderate, Stage II, RLE/RLQ cancer -related lymphedema with onset 11 years ago after Rx for endometrial cancer. Pt is well known to this therapist as she has successfully undergone Complete Decongestive Therapy (CDT)  this clinic bin the past. Pt reports recent exacerbation of RLE swelling worsened as inflammation in L hip has worsened over time.  Pt has a hip replacement scheduled in late December here [facility]. RE limb volumetrics today reveal that R LEG volume is dramatically increased by 59.65 since last visit. R LEG VOLUME is INCREASED in volume by 59.6%. L THIGH is INCREASED by 38 %, and RLE full limb is INCREASED by 45.38%. Pt presents with LE-related skin changes. Prolonged inflammation in the R hip joint has likely overloaded  already RLE/RLQ lymphatics.  RLE/RLQ lymphedema limits Pt's ability to perform basic and instrumental ADLs, including functional ambulation, mobility and transfers, grooming , lower body bathing and dressing, skin inspection and skin care. Pt has difficulty  reaching her lower legs and feet to  apply compression wraps and don/doff        compression stockings. LE limits ability to P[perform instrumental ADLs, including driving, yard work and home management activities. Lymphedema limits her ability to participate in leisure pursuits and productive activities, and it negatively impacts body image, life roles and quality of life.   Pt will benefit from an Intensive and follow along course of lymphedema. CDT will consist of Manual Lymphatic gainage   (MLD), skin care, therapeutic exercise and compression, wraps initially then garments. Pt will need assistance throughout CDT   for applying compression wraps due to limited hip AROM and decreased skin flexibility. Without skilled Occupational Therapy for lymphedema care, lymphedema will progress and further functional decline is expected.   In prep for upcoming hip replacement OT will educate Pt re assistive  devices, including tub transfer bench and elevated toilet seat, in keeping with hip precautions.  OBJECTIVE IMPAIRMENTS: Abnormal gait, decreased activity tolerance, decreased balance, decreased knowledge of use of DME, decreased mobility, difficulty walking, decreased ROM, decreased strength, increased edema, decreased skin flexibility, increased fascial restrictions, impaired sensation, pain, and chronic, progressive LLE/LLQ lymphatic swelling and associated pain.   ADL LIMITATIONS: carrying, lifting, bending, sitting, standing, squatting, sleeping, stairs, transfers, bed mobility, bathing, dressing, hygiene/grooming, and productive activities, leisure pursuits, social participation, body image driving, shopping, housework, yard work, cooking, meal prep  PERSONAL FACTORS: Past/current experiences and 3+ comorbidities: Morphea Scleroderma, Mixed connective tissue disease, and osteoarthritis are also affecting patient's functional outcome.   REHAB POTENTIAL: Good  EVALUATION COMPLEXITY: Moderate  GOALS: Goals reviewed with patient? Yes  SHORT TERM GOALS: Target date: 4th OT Rx visit   Pt will demonstrate understanding of lymphedema precautions and prevention strategies with modified independence using a printed reference to identify at least 5 precautions and discussing how s/he may implement them into daily life to reduce risk of progression with extra time.  Baseline:Max A Goal status: GOAL MET  2.  Pt will be able to apply multilayer, knee length, gradient, compression wraps to one leg at a time with modified assistance (extra time and assistive device/s) to decrease limb volume, to limit infection risk, and to limit lymphedema progression.  Baseline: Dependent Goal status: GOAL MET  LONG TERM GOALS: Target date: 01/09/24 (12 weeks)  Given this patient's Intake score 36% on the functional outcomes FOTO tool, patient will experience an increase in function of 3 points to improve basic and  instrumental ADLs performance, including lymphedema self-care.  Baseline: Max A Goal status: DEFERRED as [employer] has been discontinued  2.  Given this patient's Intake score of  75% on the Lymphedema Life Impact Scale (LLIS), patient will experience a reduction of at least 5 points in her perceived level of functional impairment resulting from lymphedema to improve functional performance and quality of life (QOL). Baseline: % Goal status: PROGRESSING  3.  Pt will achieve at least a 10% volume reduction in full, RLE limb volume to return limb to typical size and shape, to limit infection risk and LE progression, to decrease pain, to improve function. Baseline: Dependent Goal status:PROGRESSING. See chart   4.  Pt will obtain proper compression garments/devices and achieve modified independence (extra time + assistive devices) with donning/doffing to optimize limb volume reductions and limit LE  progression over time. Baseline:  Goal status: 01/04/24: PARTIALLY MET; In an effort to reduce burden of care on her spouse, Pt obtained adjustable, Velcro style R full leg, Circaid leg reduction kit for use s/p THA, but this proved  not effective for controlling swelling, and were not easier to don and doff more independently. We've resumed wrapping until she is able to fit back into custom compression garments. 02/14/24: Ongoing 04/11/24: Submitted new garment measurements to DME vendor for 3 piece system to address worsening lymphedema. Pt's insurance company denied coverage. She in the process of appealing.  5.  During Intensive phase CDT , with modified independence, Pt will achieve at least 85% compliance with all lymphedema self-care home program components, including daily skin care, compression wraps and /or garments, simple self MLD and lymphatic pumping therex to habituate LE self care protocol  into ADLs for optimal LE self-management over time. Baseline: Dependent Goal status: 02/14/24: GOAL MET and  EXCEEDED PLAN:  OT FREQUENCY: 2-3 x/week  OT DURATION: 12 weeks and PRN  PLANNED INTERVENTIONS: Complete Decongestive Therapy (Intensive and supported Self-Management Phases), 97110-Therapeutic exercises, 97530- Therapeutic activity, 97535- Self Care, 16109- Manual therapy, Patient/Family education, Taping, Manual lymph drainage, Scar mobilization, Compression bandaging, DME instructions, and skin care to reduce infection risk throughout manual therapy. Fit with replacement compression garments ASAP  PLAN FOR NEXT SESSION:  Cont MLD as established Cont multilayer compression wrapping   Arnold Bicker, MS, OTR/L, CLT-LANA 04/23/24 12:43 PM

## 2024-04-24 ENCOUNTER — Encounter (INDEPENDENT_AMBULATORY_CARE_PROVIDER_SITE_OTHER): Payer: Self-pay

## 2024-04-25 ENCOUNTER — Ambulatory Visit

## 2024-04-25 ENCOUNTER — Ambulatory Visit: Admitting: Occupational Therapy

## 2024-04-25 DIAGNOSIS — R2681 Unsteadiness on feet: Secondary | ICD-10-CM

## 2024-04-25 DIAGNOSIS — R262 Difficulty in walking, not elsewhere classified: Secondary | ICD-10-CM

## 2024-04-25 DIAGNOSIS — M25651 Stiffness of right hip, not elsewhere classified: Secondary | ICD-10-CM

## 2024-04-25 DIAGNOSIS — I890 Lymphedema, not elsewhere classified: Secondary | ICD-10-CM | POA: Diagnosis not present

## 2024-04-25 DIAGNOSIS — M6281 Muscle weakness (generalized): Secondary | ICD-10-CM

## 2024-04-25 DIAGNOSIS — M25551 Pain in right hip: Secondary | ICD-10-CM

## 2024-04-25 NOTE — Therapy (Signed)
 OUTPATIENT OCCUPATIONAL THERAPY TREATMENT NOTE  LOWER EXTREMITY LYMPHEDEMA  Patient Name: Elizabeth Martin MRN: [id number] DOB:08-12-1961, 63 y.o., female Today's Date: 04/25/2024   END OF SESSION:  Lymphedema Episode 2   OT End of Session - 04/25/24 1215     Visit Number 34    Number of Visits 36    Date for OT Re-Evaluation 07/17/24    OT Start Time 1105    OT Stop Time 1215    OT Time Calculation (min) 70 min    Activity Tolerance Patient tolerated treatment well;No increased pain    Behavior During Therapy WFL for tasks assessed/performed              Past Medical History:  Diagnosis Date   Anxiety    Asthma    Autoimmune disease (HCC)    Cancer (HCC)    Endometrial lymph node removal (30)   Corneal abrasion, right 05/24/2022   Depression    GERD (gastroesophageal reflux disease)    H/O blood clots    upper rt groin and behind both knees   History of hiatal hernia    Hypertension    Lymphedema    right leg   Morphea scleroderma    PONV (postoperative nausea and vomiting)    states gets violently ill   Past Surgical History:  Procedure Laterality Date   BASAL CELL CARCINOMA EXCISION Left    CHOLECYSTECTOMY  2008   FLEXIBLE SIGMOIDOSCOPY N/A 05/19/2022   Procedure: FLEXIBLE SIGMOIDOSCOPY;  Surgeon: Toledo, Alphonsus Jeans, MD;  Location: [facility];  Service: Gastroenterology;  Laterality: N/A;   HERNIA REPAIR  2004   ILEOSTOMY CLOSURE N/A 08/24/2022   Procedure: ILEOSTOMY TAKEDOWN, open loop with Apolonio Bay, PA-C to assist;  Surgeon: Emmalene Hare, MD;  Location: [facility];  Service: General;  Laterality: N/A;   IVC FILTER INSERTION N/A 11/09/2023   Procedure: IVC FILTER INSERTION;  Surgeon: Celso College, MD;  Location: [facility];  Service: Cardiovascular;  Laterality: N/A;   IVC FILTER REMOVAL N/A 01/02/2024   Procedure: IVC FILTER REMOVAL;  Surgeon: Celso College, MD;  Location: [facility];  Service: Cardiovascular;  Laterality:  N/A;   PLANTAR FASCIECTOMY     ROBOTIC ASSISTED TOTAL HYSTERECTOMY WITH BILATERAL SALPINGO OOPHERECTOMY  02/14/2012   ROTATOR CUFF REPAIR Left 03/24/2022   TONSILLECTOMY     TOTAL HIP ARTHROPLASTY Right 11/14/2023   Procedure: TOTAL HIP ARTHROPLASTY - posterior;  Surgeon: Venus Ginsberg, MD;  Location: [facility];  Service: Orthopedics;  Laterality: Right;   XI ROBOTIC ASSISTED LOWER ANTERIOR RESECTION N/A 05/24/2022   Procedure: XI ROBOTIC ASSISTED LOWER ANTERIOR RESECTION;  Surgeon: Emmalene Hare, MD;  Location: [facility];  Service: General;  Laterality: N/A;   Patient Active Problem List   Diagnosis Date Noted   Constipation 01/10/2024   Nausea & vomiting 01/10/2024   S/P total right hip arthroplasty 11/14/2023   Blood coagulation disorder (HCC) 11/10/2023   PTSD (post-traumatic stress disorder) 11/10/2023   Severe episode of recurrent major depressive disorder, without psychotic features (HCC) 11/10/2023   GAD (generalized anxiety disorder) 11/10/2023   High risk medication use 11/10/2023   Anxiety and depression 03/22/2023   Cervical radiculopathy 03/16/2023   Colon cancer screening 03/15/2023   Chronic idiopathic constipation 03/15/2023   Dyspnea 03/07/2023   [facility] spotted fever 03/07/2023   Strain of gastrocnemius tendon 03/07/2023   Lower abdominal pain 03/07/2023   Pain in pelvis 03/07/2023   Small bowel obstruction (HCC) 02/11/2023  Raynaud disease 02/11/2023   Polyarthralgia 02/11/2023   Edema 01/03/2023   Diverticular disease 12/10/2022   Hot flashes 12/10/2022   Pain of right lower extremity 12/10/2022   Paresthesia of lower extremity 12/10/2022   Primary insomnia 12/10/2022   Thyroid  nodule 12/10/2022   Multiple joint pain 12/10/2022   Basal cell carcinoma of skin 12/10/2022   Acute bilateral low back pain without sciatica 11/10/2022   Chronic lower back pain 09/05/2022   S/P closure of ileostomy 08/24/2022   Ileostomy in place [facility])    Morphea  07/13/2022   Colonic stricture (HCC) 05/19/2022   Large bowel obstruction (HCC) 05/19/2022   Hypokalemia 05/18/2022   Abdominal pain 05/18/2022   Osteoarthritis of left knee 02/24/2022   Mixed connective tissue disease (HCC) 02/17/2022   Adhesive capsulitis of left shoulder 01/27/2022   Glenohumeral arthritis, left 01/27/2022   Microscopic hematuria 01/12/2022   Otorrhagia of right ear 12/10/2021   Elevated testosterone level in female 11/04/2021   Anti-TPO antibodies present 10/20/2021   Rash 10/13/2021   Hashimoto thyroiditis, 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   Hashimoto's disease 03/23/2021   Exposure to severe acute respiratory syndrome coronavirus 2 (SARS-CoV-2) 12/19/2020   Migraine without aura and without status migrainosus, not intractable 08/19/2020   Myofascial pain 07/30/2020   Neck pain 07/30/2020   Upper back pain 07/30/2020   Cervical facet joint syndrome 07/30/2020   Hair loss 07/14/2020   Edema of lower extremity 07/11/2020   Adenomatous colon polyp 02/26/2020   Gastroesophageal reflux disease 02/15/2020   Diarrhea 02/15/2020   Sleep disorder 01/16/2020   Hyperlipidemia 07/11/2019   Right shoulder pain 04/04/2019   Pre-operative clearance 12/13/2018   Endometrial cancer (HCC) 04/05/2018   Patellofemoral pain syndrome of right knee 03/20/2018   Gastroenteritis 12/07/2017   Fatigue 11/16/2017   Morbid obesity (HCC) 08/11/2017   Stucco keratoses 08/11/2017   Essential hypertension 08/09/2017   History of endometrial cancer 08/09/2017   Lymphedema 08/09/2017   Menopause 08/09/2017   Malignant neoplastic disease (HCC) 04/07/2017   Cellulitis 09/18/2016   Benign hypertension 06/02/2016   Asthma 06/04/1969    PCP: Eather Golder, MD  REFERRING PROVIDER: Eather Golder, MD  REFERRING DIAG: I89.0   THERAPY DIAG:  Lymphedema, not elsewhere classified  Rationale for Evaluation and Treatment: Rehabilitation  ONSET DATE: 2013  (Cancer-related, endometrial  2013)  SUBJECTIVE:                                                                                                                                                                                           SUBJECTIVE STATEMENT:Patient returns to OT for lymphedema care to RLE/RLQ.  Pt  rates LE-related pain at 3/10 today. Pt had PT before OT session today. When asked what she wanted to work on today, Pt stated, "That calf is so full." OT and Pt discussed increased infection risk when limb swelling is elevated. Pt is an avid gardener, so reviewed importance of timely first aid in case of bee stings, insect bites, blisters, etc.  PERTINENT HISTORY: Asthma, Autoimmune Disease (Connective Tissue Disease), Endometrial Ca w/ LND ( 30 bilateral pelvic and periaortic LN), adjuvant chemotherapy and XRT,   H/O blood clots, RLE/RLQ lymphedema, Robotic assisted total hysterectomy w/ bilateral oophorectomy L Rotator Cuff repair, S/P ileostomy 2/2 colon stricture 05/2022; s/p R hip arthroplasty 11/11/23.   PAIN:  Are you having pain? YES. See subjective; 3/10 Pain location: RLE, R hip, cervical spine Pain description: sore, aching, heavy, full, tight,  Aggravating factors: standing, walking, extended dependent sitting Relieving factors: elevation, movement  PRECAUTIONS: Fall and Other: LYMPHEDEMA  WEIGHT BEARING RESTRICTIONS: Yes 5# lifting restriction- shoulder  FALLS:  Has patient fallen in last 6 months? No  LIVING ENVIRONMENT: Lives with: lives with their spouse Lives in: House/apartment Stairs: No;  Has following equipment at home: Single point cane, Environmental consultant - 2 wheeled, Environmental consultant - 4 wheeled, and Wheelchair (manual)  OCCUPATION: retired Control and instrumentation engineer: gardening, hiking, biking, dancing- unable to participate in any of these due to pain and swelling  HAND DOMINANCE: right   PRIOR LEVEL OF FUNCTION: Independent with basic ADLs, Independent with household mobility without device, Independent with  community mobility without device, Requires assistive device for independence, Needs assistance with homemaking, Needs assistance with transfers, and Leisure: decreased  social participation for leisure pursuits due to impaired mobility and pain  PATIENT GOALS:  Be able to move freely without pain Reduce limb volume to be able to lift limb for basic daily ADLs and functional ambulation Reduce limb volume to increase ability to perform lower body dressing and bathing and grooming Reduce limb to increase body image  OBJECTIVE: Note: Objective measures were completed at Evaluation unless otherwise noted.  COGNITION:  Overall cognitive status: Within functional limits for tasks assessed   OBSERVATIONS / OTHER ASSESSMENTS:   POSTURE: WFL  LE ROM: WFL, but Limited mildly at R knee and ankle due to girth, skin approximation, and joint pain  LE MMT: WFL  Mild, Stage  II, Bilateral Lower Extremity Lymphedema 2/2 CVI and Obesity  Skin  Description Hyper-Keratosis Peau d' Orange Shiny Tight Fibrotic/ Indurated Fatty Doughy Spongy/ boggy   x x x x R>L Severe morphea at  R groin obstructing lymphatics   x   Skin dry Flaky WNL Macerated   mildly sclerotic     Color Redness Varicosities Blanching Hemosiderin Stain Mottled   x x x   x   Odor Malodorous Yeast Fungal infection  WNL      x   Temperature Warm Cool wnl    x     Pitting Edema   1+ 2+ 3+ 4+ Non-pitting         x   Girth Symmetrical Asymmetrical                   Distribution    R>L RLE toes to groin    Stemmer Sign Positive Negative   +    Lymphorrhea History Of:  Present Absent     x    Wounds History Of Present Absent Venous Arterial Pressure Sheer     x  Signs of Infection Redness Warmth Erythema Acute Swelling Drainage Borders                    Sensation Light Touch Deep pressure Hypersensitivity   In tact Impaired In tact Impaired Absent Impaired   x  x  x     Nails WNL   Fungus nail  dystrophy   x     Hair Growth Symmetrical Asymmetrical    R>L   Skin Creases Base of toes  Ankles   Base of Fingers knees       Abdominal pannus Thigh Lobules  Face/neck   x x  x        BLE COMPARATIVE LIMB VOLUMETRICS 10/10/23  LANDMARK RIGHT  10/10/23  R LEG (A-D) 5466.5 ml  R THIGH (E-G) 9186.6 ml  R FULL LIMB (A-G) 14653.1 ml  Limb Volume differential (LVD)  LVD for LEG = 44.1%, R>L LVD for THIGH= 33.98%, R>L LVD for FULL lower extremity 37.8%, R>L  Volume change since last 08/11/22 R LEG is INCREASED in volume by 59.6%. L THIGH is INCREASED by 38 %, and RLE full limb is INCREASED by 45.38%.  Volume change overall V  (Blank rows = not tested)  LANDMARK LEFT  10/12/23  L LEG (A-D) 3053.6 ml  L THIGH (E-G) 6064.8 ml  L  FULL LIMB (A-G) 9118.4 ml  Limb Volume differential (LVD)  %  Volume change since initial %  Volume change overall %  (Blank rows = not tested)   10 th VISIT PROGRESS NOTE RLE COMPARATIVE LIMB VOLUMETRICS 01/04/24: Deferred until next visit by Pt request.  LANDMARK RIGHT    R LEG (A-D) TBA  R THIGH (E-G) TBA  R FULL LIMB (A-G) TBA  Limb Volume differential (LVD)    Volume change since last 08/11/22 TBA  Volume change overall TBA  (Blank rows = not tested)  20 th VISIT PROGRESS NOTE RLE COMPARATIVE LIMB VOLUMETRICS TBA next session  LANDMARK RIGHT    R LEG (A-D) 6005.7 ml  R THIGH (E-G) 9400.3 ml  R FULL LIMB (A-G) 15406.02 ml  Limb Volume differential (LVD)    Volume change since last measured on 10/10/23 R LEG is INCREASED in volume by 9.9%. L THIGH is INCREASED by 2.32 %, and RLE full limb is INCREASED by 5.13% since last measured on 10/10/23.  Volume change since commencing OT for CDT %    28 th VISIT PROGRESS NOTE RLE COMPARATIVE LIMB VOLUMETRICS TBA next session  LANDMARK RIGHT    R LEG (A-D) 5936.1 ml  R THIGH (E-G) 8538.6  ml  R FULL LIMB (A-G) 14524.7  ml  Limb Volume differential (LVD)    Volume change since last measured on 10/10/23 R  LEG is DECREASED in volume by 1.2 %.  Since last measured on 03/14/24. R THIGH is DECREASED by 8.6 %, and RLE full limb is DECREASED by 5.72 % since last measured on 03/14/24.  Volume change since commencing OT for CDT %   ENDOMETRIAL CA-related LYMPHEDEMA Hx:  SURGERY TYPE/DATE: 2013 NUMBER OF LYMPH NODES REMOVED: 30 by report- bilateral pelvic and periaortic CHEMOTHERAPY: yes RADIATION:yes INFECTIONS: no hx cellulitis  LYMPHEDEMA LIFE IMPACT SCALE (LLIS): Intake 10/12/23 75% (The extent to which lymphedema related problems impacted your life over the past week)  FOTO (functional outcome measure):10/10/23 INTAKE: 36% No Out take score. FOTO assessment discontinued by clinic.  PATIENT EDUCATION:  Education details: Continued Pt/ CG edu for lymphedema self care home  program throughout session. Topics include outcome of comparative limb volumetrics- starting limb volume differentials (LVDs), technology and gradient techniques used for short stretch, multilayer compression wrapping, simple self-MLD, therapeutic lymphatic pumping exercises, skin/nail care, LE precautions,. compression garment recommendations and specifications, wear and care schedule and compression garment donning / doffing w assistive devices. Discussed progress towards all OT goals since commencing CDT. All questions answered to the Pt's satisfaction. Good return. Person educated: Patient Education method: Explanation, Demonstration, and Handouts Education comprehension: verbalized understanding, returned demonstration, and needs further education  LE SELF-CARE HOME  PROGRAM: Simple self-MLD/daily to affected quadrant and body part At least 2 x daily BLE Lymphatic Pumping There ex 1 set of 10 reps each, in order, bilaterally Daily skin care to affected body part to limit infection risk and increase skin excursion Compression Bandaging Intensive stage compression: multilayer short stretch wraps with gradient techniques. One limb at  a time. Length patient dependent. Self-management Phase: Appropriate daytime compression garment and hours-of-sleep device  Compression garments: Custom-made gradient compression garments and hours-of-sleep devices are medically necessary because they are uniquely sized and shaped to fit the exact dimensions of the affected extremities and to provide accurate and consistent gradient compression and containment, essential  for optimally managing chronic, progressive lymphedema. The convoluted HOS devices are medically necessary to facilitate increased lymphatic circulation and limit fibrosis formation when sleeping. Multiple custom compression garments are needed for optimal hygiene to limit infection risk. Custom compression garments should be replaced q 3-6 months When worn consistently for optimal lipo-lymphedema self-management over time.  ASSESSMENT:  CLINICAL IMPRESSION:  RLE is Continued RLE/RLQ MLD in side lying and supine as established working proximally to distal from neck, deep abdominal pathways, functional axillary LN, and J strokes  from thigh to toes. Rpt tolerated MLD and deeper fibrosis techniques without increased pain. At present RLE/RLQ is intractable to CDT.  Limb density and swelling are minimally reduced and softened since commencing CDT after hip replacement . Inflammation in limb   is stubborn and persistent in context of mixed connective tissue d disease. At  present elevated leg swelling is affecting skin integrity as cobblestone like texture and patches of dry, rough skin are palpable at dorsal foot and distal leg. After MLD we Applied LLE multilayer compression wraps as established. Continue to support Pt in her efforts to obtain medically necessary custom compression garments and devices. Cont CDT 3 x weekly as per POC.  10/10/23 Lymphedema Episode 2, Initial OT Evaluation : Ladrea Holladay is a 65 y a female presenting with chronic, progressive, moderate, Stage II, RLE/RLQ  cancer -related lymphedema with onset 11 years ago after Rx for endometrial cancer. Pt is well known to this therapist as she has successfully undergone Complete Decongestive Therapy (CDT)  this clinic bin the past. Pt reports recent exacerbation of RLE swelling worsened as inflammation in L hip has worsened over time.  Pt has a hip replacement scheduled in late December here it [facility]. RE limb volumetrics today reveal that R LEG volume is dramatically increased by 59.65 since last visit. R LEG VOLUME is INCREASED in volume by 59.6%. L THIGH is INCREASED by 38 %, and RLE full limb is INCREASED by 45.38%. Pt presents with LE-related skin changes. Prolonged inflammation in the R hip joint has likely overloaded  already RLE/RLQ lymphatics.  RLE/RLQ lymphedema limits Pt's ability to perform basic and instrumental ADLs, including functional ambulation, mobility and transfers, grooming , lower body bathing and dressing, skin inspection and skin care. Pt has  difficulty  reaching her lower legs and feet to apply compression wraps and don/doff        compression stockings. LE limits ability to P[perform instrumental ADLs, including driving, yard work and home management activities. Lymphedema limits her ability to participate in leisure pursuits and productive activities, and it negatively impacts body image, life roles and quality of life.   Pt will benefit from an Intensive and follow along course of lymphedema. CDT will consist of Manual Lymphatic gainage   (MLD), skin care, therapeutic exercise and compression, wraps initially then garments. Pt will need assistance throughout CDT   for applying compression wraps due to limited hip AROM and decreased skin flexibility. Without skilled Occupational Therapy for lymphedema care, lymphedema will progress and further functional decline is expected.   In prep for upcoming hip replacement OT will educate Pt re assistive devices, including tub transfer bench and elevated  toilet seat, in keeping with hip precautions.  OBJECTIVE IMPAIRMENTS: Abnormal gait, decreased activity tolerance, decreased balance, decreased knowledge of use of DME, decreased mobility, difficulty walking, decreased ROM, decreased strength, increased edema, decreased skin flexibility, increased fascial restrictions, impaired sensation, pain, and chronic, progressive LLE/LLQ lymphatic swelling and associated pain.   ADL LIMITATIONS: carrying, lifting, bending, sitting, standing, squatting, sleeping, stairs, transfers, bed mobility, bathing, dressing, hygiene/grooming, and productive activities, leisure pursuits, social participation, body image driving, shopping, housework, yard work, cooking, meal prep  PERSONAL FACTORS: Past/current experiences and 3+ comorbidities: Morphea Scleroderma, Mixed connective tissue disease, and osteoarthritis are also affecting patient's functional outcome.   REHAB POTENTIAL: Good  EVALUATION COMPLEXITY: Moderate  GOALS: Goals reviewed with patient? Yes  SHORT TERM GOALS: Target date: 4th OT Rx visit   Pt will demonstrate understanding of lymphedema precautions and prevention strategies with modified independence using a printed reference to identify at least 5 precautions and discussing how s/he may implement them into daily life to reduce risk of progression with extra time.  Baseline:Max A Goal status: GOAL MET  2.  Pt will be able to apply multilayer, knee length, gradient, compression wraps to one leg at a time with modified assistance (extra time and assistive device/s) to decrease limb volume, to limit infection risk, and to limit lymphedema progression.  Baseline: Dependent Goal status: GOAL MET  LONG TERM GOALS: Target date: 01/09/24 (12 weeks)  Given this patient's Intake score 36% on the functional outcomes FOTO tool, patient will experience an increase in function of 3 points to improve basic and instrumental ADLs performance, including lymphedema  self-care.  Baseline: Max A Goal status: DEFERRED as [employer] has been discontinued  2.  Given this patient's Intake score of  75% on the Lymphedema Life Impact Scale (LLIS), patient will experience a reduction of at least 5 points in her perceived level of functional impairment resulting from lymphedema to improve functional performance and quality of life (QOL). Baseline: % Goal status: PROGRESSING  3.  Pt will achieve at least a 10% volume reduction in full, RLE limb volume to return limb to typical size and shape, to limit infection risk and LE progression, to decrease pain, to improve function. Baseline: Dependent Goal status:PROGRESSING. See chart   4.  Pt will obtain proper compression garments/devices and achieve modified independence (extra time + assistive devices) with donning/doffing to optimize limb volume reductions and limit LE  progression over time. Baseline:  Goal status: 01/04/24: PARTIALLY MET; In an effort to reduce burden of care on her spouse, Pt obtained adjustable, Velcro style R full leg, Circaid leg  reduction kit for use s/p THA, but this proved not effective for controlling swelling, and were not easier to don and doff more independently. We've resumed wrapping until she is able to fit back into custom compression garments. 02/14/24: Ongoing 04/11/24: Submitted new garment measurements to DME vendor for 3 piece system to address worsening lymphedema. Pt's insurance company denied coverage. She in the process of appealing.  5.  During Intensive phase CDT , with modified independence, Pt will achieve at least 85% compliance with all lymphedema self-care home program components, including daily skin care, compression wraps and /or garments, simple self MLD and lymphatic pumping therex to habituate LE self care protocol  into ADLs for optimal LE self-management over time. Baseline: Dependent Goal status: 02/14/24: GOAL MET and EXCEEDED PLAN:  OT FREQUENCY: 2-3 x/week  OT  DURATION: 12 weeks and PRN  PLANNED INTERVENTIONS: Complete Decongestive Therapy (Intensive and supported Self-Management Phases), 97110-Therapeutic exercises, 97530- Therapeutic activity, 97535- Self Care, 16109- Manual therapy, Patient/Family education, Taping, Manual lymph drainage, Scar mobilization, Compression bandaging, DME instructions, and skin care to reduce infection risk throughout manual therapy. Fit with replacement compression garments ASAP  PLAN FOR NEXT SESSION:  Cont MLD as established Cont multilayer compression wrapping   Arnold Bicker, MS, OTR/L, CLT-LANA 04/25/24 12:16 PM

## 2024-04-25 NOTE — Therapy (Signed)
 OUTPATIENT PHYSICAL THERAPY TREATMENT   Patient Name: Elizabeth Martin MRN: [id number] DOB:1961-07-08, 63 y.o., female Today's Date: 04/25/2024  END OF SESSION:  PT End of Session - 04/25/24 1021     Visit Number 23    Number of Visits 35    Date for PT Re-Evaluation 06/04/24    Authorization Type [employer]    Progress Note Due on Visit 30    PT Start Time 1015    PT Stop Time 1057    PT Time Calculation (min) 42 min    Equipment Utilized During Treatment Gait belt    Activity Tolerance Patient tolerated treatment well    Behavior During Therapy WFL for tasks assessed/performed                     Past Medical History:  Diagnosis Date   Anxiety    Asthma    Autoimmune disease (HCC)    Cancer (HCC)    Endometrial lymph node removal (30)   Corneal abrasion, right 05/24/2022   Depression    GERD (gastroesophageal reflux disease)    H/O blood clots    upper rt groin and behind both knees   History of hiatal hernia    Hypertension    Lymphedema    right leg   Morphea scleroderma    PONV (postoperative nausea and vomiting)    states gets violently ill   Past Surgical History:  Procedure Laterality Date   BASAL CELL CARCINOMA EXCISION Left    CHOLECYSTECTOMY  2008   FLEXIBLE SIGMOIDOSCOPY N/A 05/19/2022   Procedure: FLEXIBLE SIGMOIDOSCOPY;  Surgeon: Toledo, Alphonsus Jeans, MD;  Location: [facility];  Service: Gastroenterology;  Laterality: N/A;   HERNIA REPAIR  2004   ILEOSTOMY CLOSURE N/A 08/24/2022   Procedure: ILEOSTOMY TAKEDOWN, open loop with Apolonio Bay, PA-C to assist;  Surgeon: Emmalene Hare, MD;  Location: [facility] ORS;  Service: General;  Laterality: N/A;   IVC FILTER INSERTION N/A 11/09/2023   Procedure: IVC FILTER INSERTION;  Surgeon: Celso College, MD;  Location: [facility];  Service: Cardiovascular;  Laterality: N/A;   IVC FILTER REMOVAL N/A 01/02/2024   Procedure: IVC FILTER REMOVAL;  Surgeon: Celso College, MD;  Location: [facility]  CV [facility];  Service: Cardiovascular;  Laterality: N/A;   PLANTAR FASCIECTOMY     ROBOTIC ASSISTED TOTAL HYSTERECTOMY WITH BILATERAL SALPINGO OOPHERECTOMY  02/14/2012   ROTATOR CUFF REPAIR Left 03/24/2022   TONSILLECTOMY     TOTAL HIP ARTHROPLASTY Right 11/14/2023   Procedure: TOTAL HIP ARTHROPLASTY - posterior;  Surgeon: Venus Ginsberg, MD;  Location: [facility] OR[facility];  Service: Orthopedics;  Laterality: Right;   XI ROBOTIC ASSISTED LOWER ANTERIOR RESECTION N/A 05/24/2022   Procedure: XI ROBOTIC ASSISTED LOWER ANTERIOR RESECTION;  Surgeon: Emmalene Hare, MD;  Location: [facility] OR[facility];  Service: General;  Laterality: N/A;   Patient Active Problem List   Diagnosis Date Noted   Constipation 01/10/2024   Nausea & vomiting 01/10/2024   S/P total right hip arthroplasty 11/14/2023   Blood coagulation disorder (HCC) 11/10/2023   PTSD (post-traumatic stress disorder) 11/10/2023   Severe episode of recurrent major depressive disorder, without psychotic features (HCC) 11/10/2023   GAD (generalized anxiety disorder) 11/10/2023   High risk medication use 11/10/2023   Anxiety and depression 03/22/2023   Cervical radiculopathy 03/16/2023   Colon cancer screening 03/15/2023   Chronic idiopathic constipation 03/15/2023   Dyspnea 03/07/2023   [place] spotted fever 03/07/2023   Strain of gastrocnemius tendon 03/07/2023  Lower abdominal pain 03/07/2023   Pain in pelvis 03/07/2023   Small bowel obstruction (HCC) 02/11/2023   Raynaud disease 02/11/2023   Polyarthralgia 02/11/2023   Edema 01/03/2023   Diverticular disease 12/10/2022   Hot flashes 12/10/2022   Pain of right lower extremity 12/10/2022   Paresthesia of lower extremity 12/10/2022   Primary insomnia 12/10/2022   Thyroid  nodule 12/10/2022   Multiple joint pain 12/10/2022   Basal cell carcinoma of skin 12/10/2022   Acute bilateral low back pain without sciatica 11/10/2022   Chronic lower back pain 09/05/2022   S/P closure of ileostomy 08/24/2022    Ileostomy in place [facility])    Morphea 07/13/2022   Colonic stricture (HCC) 05/19/2022   Large bowel obstruction (HCC) 05/19/2022   Hypokalemia 05/18/2022   Abdominal pain 05/18/2022   Osteoarthritis of left knee 02/24/2022   Mixed connective tissue disease (HCC) 02/17/2022   Adhesive capsulitis of left shoulder 01/27/2022   Glenohumeral arthritis, left 01/27/2022   Microscopic hematuria 01/12/2022   Otorrhagia of right ear 12/10/2021   Elevated testosterone level in female 11/04/2021   Anti-TPO antibodies present 10/20/2021   Rash 10/13/2021   Hashimoto thyroiditis, 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   Hashimoto's disease 03/23/2021   Exposure to severe acute respiratory syndrome coronavirus 2 (SARS-CoV-2) 12/19/2020   Migraine without aura and without status migrainosus, not intractable 08/19/2020   Myofascial pain 07/30/2020   Neck pain 07/30/2020   Upper back pain 07/30/2020   Cervical facet joint syndrome 07/30/2020   Hair loss 07/14/2020   Edema of lower extremity 07/11/2020   Adenomatous colon polyp 02/26/2020   Gastroesophageal reflux disease 02/15/2020   Diarrhea 02/15/2020   Sleep disorder 01/16/2020   Hyperlipidemia 07/11/2019   Right shoulder pain 04/04/2019   Pre-operative clearance 12/13/2018   Endometrial cancer (HCC) 04/05/2018   Patellofemoral pain syndrome of right knee 03/20/2018   Gastroenteritis 12/07/2017   Fatigue 11/16/2017   Morbid obesity (HCC) 08/11/2017   Stucco keratoses 08/11/2017   Essential hypertension 08/09/2017   History of endometrial cancer 08/09/2017   Lymphedema 08/09/2017   Menopause 08/09/2017   Malignant neoplastic disease (HCC) 04/07/2017   Cellulitis 09/18/2016   Benign hypertension 06/02/2016   Asthma 06/04/1969    PCP: Eather Golder MD   REFERRING PROVIDER: Eather Golder MD   REFERRING DIAG: s/p R hip replacement  THERAPY DIAG:  Muscle weakness (generalized)  Stiffness of right hip, not elsewhere classified  Pain in  right hip  Difficulty in walking, not elsewhere classified  Unsteadiness on feet  Rationale for Evaluation and Treatment: Rehabilitation  ONSET DATE: 11/14/23  SUBJECTIVE:   SUBJECTIVE STATEMENT:     Patient reports sore and feeling rough with her autoimmune issues- Nauseated today.    PERTINENT HISTORY: s/p R hip arthropaslasty 11/14/23 with Dr. Clyda Dark posterior approach, blood coagulation disorder, PTSD, anxiety, cervical radiculopathy dypsnea, polyarthralgia, Raynaud disease, s/p closure of ileostomy, Hashimoto thyroid , endometrial cancer, menopause, endometrial cancer, lymphedema. Patient had home health PT prior to PT here.   PAIN:  Are you having pain?  2/10 Rt posterior deep hip and medial posterior gluteals   PRECAUTIONS: Other: recovering from hip sx  RED FLAGS: None   WEIGHT BEARING RESTRICTIONS: No  FALLS:  Has patient fallen in last 6 months? Yes. Number of falls 2  LIVING ENVIRONMENT: Lives with: lives with their spouse Lives in: House/apartment Stairs: No Has following equipment at home: Single point cane  OCCUPATION: retired,   PLOF: Independent  PATIENT GOALS: return to hiking, working out, gardening.  NEXT MD VISIT: March 2025   OBJECTIVE:  Note: Objective measures were completed at Evaluation unless otherwise noted.  DIAGNOSTIC FINDINGS:  IMPRESSION: "Right hip replacement.  No visible complicating feature."  PATIENT SURVEYS:  LEFS 5/80  COGNITION: Overall cognitive status: Within functional limits for tasks assessed    MUSCLE LENGTH: Hamstrings: limited bilaterally with R>L   LOWER EXTREMITY ROM:  Passive ROM Right  04/02/24 Right eval Left eval  Hip flexion >90 degrees 48 flexion PROM supine   Hip extension 0 degrees    Hip abduction ~25 degrees 5 degrees AROM     (Blank rows = not tested)  LOWER EXTREMITY MMT:  MMT Right 04/02/24 Right eval Left eval  Hip flexion 4/5 2+ 5  Hip extension (supine 30 deg) 2-/5   5  Hip  Hor ADD 5/5    Hip abduction 2+/5 2+*  5  Hip Hor ABDCT 4+/5    Hip adduction  2+ 5  Hip internal rotation 4+/5    Hip external rotation 3-/5     Knee flexion 5/5 3 5   Knee extension 4+/5 3 5   Ankle dorsiflexion 5/5 3 5   Ankle plantarflexion 5/5 3 5    (Blank rows = not tested)   FUNCTIONAL TESTS:  5 times sit to stand: 29 6 minute walk test:  10 meter walk test: 15 seconds with SPC   GAIT: Distance walked: 60 ft Assistive device utilized: Single point cane Level of assistance: CGA Comments: antalgic gait pattern with decreased stance time RLE. Slight vaulting pattern.  Stair negotiation: -step to pattern lead with LLE, one hand on cane one hand on rail ascending. Descending step to pattern down with RLE.                                                                                                                               TREATMENT DATE:  04/25/24  Therex: Standing hamstring stretch hold 30 sec x 3 each LE Standing hip flexor stretch hold 30 sec x 3 each LE Standing calf stretch - hold 30 sec x 3   TA:  Stepping fwd over obstacles in // bars- No UE Support- x 8 Stepping bwd over obstacles in // bars - No UE Support x 8 Side stepping over obstacles in // bars - No UE Support x 5 High knee march walk (slow and controlled working more on SLS) x4 Toe walk down and back x 4 Fwd lunge walk - down and back x 4 Walking ham curl- down and back x 4         PATIENT EDUCATION:  Education details: exercise technique Person educated: Patient Education method: Explanation, Demonstration, Tactile cues, and Verbal cues Education comprehension: verbalized understanding, returned demonstration, verbal cues required, tactile cues required, and needs further education  HOME EXERCISE PROGRAM:  Access Code: [facility] URL: [email or web address] Date: 04/11/2024 Prepared by: Ferrell Hu  Exercises - Side Stepping with Counter Support  - 1 x daily -  7  x weekly - 2 sets - 10 reps - 5 hold - Standing Hip Extension  - 1 x daily - 7 x weekly - 2 sets - 10 reps - 5 hold - Standing March with Counter Support  - 1 x daily - 7 x weekly - 2 sets - 10 reps - 5 hold - Standing Hip Abduction with Counter Support  - 1 x daily - 7 x weekly - 2 sets - 10 reps - Seated Hamstring Stretch  - 1 x daily - 7 x weekly - 2 sets - 2 reps - 30 hold - Sit to Stand  - 1 x daily - 7 x weekly - 2 sets - 10 reps - 5 hold - Supine Heel Slide  - 1 x daily - 7 x weekly - 2 sets - 6 reps - Supine Gluteal Sets  - 1 x daily - 7 x weekly - 2 sets - 10 reps - Supine Bridge  - 1 x daily - 7 x weekly - 3 sets - 5 reps - 2-3sec hold - Supine Short Arc Quad  - 1 x daily - 7 x weekly - 3 sets - 15 reps - Small Range Straight Leg Raise  - 1 x daily - 7 x weekly - 3 sets - 5 reps - Hooklying Clamshell with Resistance  - 1 x daily - 7 x weekly - 3 sets - 15 reps - Prone Hip Extension with Bent Knee  - 1 x daily - 7 x weekly - 3 sets - 10 reps - Prone Hip Extension Sequence  - 1 x daily - 7 x weekly - 3 sets - 10 reps - Supine Hamstring Stretch with Strap  - 1-2 x daily - 3 sets - 30sec hold - Modified Thomas Stretch  - 1-2 x daily - 3 sets - 30 sec hold - Hip Flexor Stretch on Step  - 1-2 x daily - 3 sets - 30 sec hold - Standing Hamstring Stretch with Step  - 1 x daily - 7 x weekly - 3 sets - 30 sec hold - Supine Figure 4 Piriformis Stretch  - 1-2 x daily - 3 sets - 30 hold - Supine Piriformis Stretch  - 1-2 x daily - 3 sets - 30 sec hold        Access Code: [facility] URL: [email or web address] Date: 02/08/2024; updated 03/21/2024 Prepared by: [employer]  Exercises - Side Stepping with Counter Support  - 1 x daily - 7 x weekly - 2 sets - 10 reps - 5 hold - Standing Hip Extension  - 1 x daily - 7 x weekly - 2 sets - 10 reps - 5 hold - Standing March with Counter Support  - 1 x daily - 7 x weekly - 2 sets - 10 reps - 5 hold - Standing Hip  Abduction with Counter Support  - 1 x daily - 7 x weekly - 2 sets - 10 reps - Seated Hamstring Stretch  - 1 x daily - 7 x weekly - 2 sets - 2 reps - 30 hold - Sit to Stand  - 1 x daily - 7 x weekly - 2 sets - 10 reps - 5 hold - Supine Heel Slide  - 1 x daily - 7 x weekly - 2 sets - 6 reps - Supine Gluteal Sets  - 1 x daily - 7 x weekly - 2 sets - 10 reps - Supine Bridge  - 1 x  daily - 7 x weekly - 3 sets - 5 reps - 2-3sec hold - Supine Short Arc Quad  - 1 x daily - 7 x weekly - 3 sets - 15 reps - Small Range Straight Leg Raise  - 1 x daily - 7 x weekly - 3 sets - 5 reps - Hooklying Clamshell with Resistance  - 1 x daily - 7 x weekly - 3 sets - 15 reps - Prone Hip Extension with Bent Knee  - 1 x daily - 7 x weekly - 3 sets - 10 reps - Prone Hip Extension Sequence  - 1 x daily - 7 x weekly - 3 sets - 10 reps  ASSESSMENT: CLINICAL IMPRESSION:    Treatment focused more on bodyweight type activities today. She performed well overall with standing activities without report of pain. She was fatigued yet able to improve overall balance with practice including stepping over obstacles with min balance impairments overall today. Pt will continue to benefit from skilled therapy to address remaining deficits in order to improve overall QoL and return to PLOF.      OBJECTIVE IMPAIRMENTS: Abnormal gait, decreased activity tolerance, decreased balance, decreased coordination, decreased endurance, decreased mobility, difficulty walking, decreased ROM, decreased strength, hypomobility, increased edema, impaired perceived functional ability, impaired flexibility, improper body mechanics, postural dysfunction, and pain.   ACTIVITY LIMITATIONS: carrying, lifting, bending, sitting, standing, squatting, sleeping, stairs, transfers, bed mobility, bathing, toileting, dressing, reach over head, hygiene/grooming, locomotion level, and caring for others  PARTICIPATION LIMITATIONS: meal prep, cleaning, laundry,  interpersonal relationship, driving, shopping, community activity, and yard work  PERSONAL FACTORS: Age, Past/current experiences, Time since onset of injury/illness/exacerbation, and 3+ comorbidities: blood coagulation disorder, PTSD, anxiety, cervical radiculopathy dypsnea, polyarthralgia, Raynaud disease, s/p closure of ileostomy, Hashimoto thyroid , endometrial cancer, menopause, endometrial cancer, lymphedema are also affecting patient's functional outcome.   REHAB POTENTIAL: Good  CLINICAL DECISION MAKING: Evolving/moderate complexity  EVALUATION COMPLEXITY: Moderate   GOALS: Goals reviewed with patient? Yes  SHORT TERM GOALS: Target date: 01/19/2024  Patient will be independent in home exercise program to improve strength/mobility for better functional independence with ADLs. Baseline:1/30: HEP given 3/12: pt reports completing 3-4 days a week depending on soreness.  Goal status:  MET  LONG TERM GOALS: Target date: 06/04/2024  Patient (> 89 years old) will complete five times sit to stand test in < 10 seconds indicating an increased LE strength and improved balance. Baseline: 1/30: 29 seconds 3/12: 19.41 03/12/2024= 17.44 seconds- without UE support; 04/16/2024- 17.1 sec without UE support.  Goal status: PROGRESSING  2.  Patient will increase six minute walk test distance to >1000 for progression to community ambulator and improve gait ability Baseline: 430 ft in 5 min and required seated rest to end test due to fatigue, uses SPC 861ft with SPC and no standing or seated rest break; 03/12/2024= 950 feet with SPC; 04/16/2024= 870 without an AD Goal status: in progress   3.  Patient will increase 10 meter walk test to >1.68m/s as to improve gait speed for better community ambulation and to reduce fall risk. Baseline: 1/30: 15 seconds with SPC  Without SPC: 10.63 and 10.38 speed: 0.95 m/s; with SPC 10.98 and 11.18; 03/12/2024= 1.0 m/s; 04/16/2024= 0.93 m/s without SPC Goal status:  PROGRESSING  4.  Patient will increase lower extremity functional scale to >60/80 to demonstrate improved functional mobility and increased tolerance with ADLs.  Baseline: 1/30: 5/80 3/12: 20/80; 03/12/2024= 29/80; 04/16/2024=13/80 Goal status: Progressing  PLAN:  PT FREQUENCY:  1-2x/week PT DURATION: 12 weeks PLANNED INTERVENTIONS: 97164- PT Re-evaluation, 97110-Therapeutic exercises, 97530- Therapeutic activity, 97112- Neuromuscular re-education, 97535- Self Care, 97140- Manual therapy, [phone]- Gait training, [phone]- Splinting, 97014- Electrical stimulation (unattended), [phone]- Electrical stimulation (manual), [id number]- Ultrasound, 69629- Traction (mechanical), Patient/Family education, Balance training, Stair training, Taping, Joint mobilization, Joint manipulation, Spinal manipulation, Spinal mobilization, Manual lymph drainage, Scar mobilization, Compression bandaging, Vestibular training, Visual/preceptual remediation/compensation, DME instructions, Cryotherapy, and Moist heat  PLAN FOR NEXT SESSION:   Continue with gentle hip stretching Progress with RLE strengthening  Continue with progressive Dynamic balance- tandem, narrowed BOS, SLS   12:32 PM, 04/25/24 Ossie Blend, PT Physical Therapist - [facility]     04/25/24, 12:32 PM

## 2024-04-27 ENCOUNTER — Encounter: Admitting: Occupational Therapy

## 2024-04-28 ENCOUNTER — Other Ambulatory Visit: Payer: Self-pay | Admitting: Psychiatry

## 2024-04-28 DIAGNOSIS — F3341 Major depressive disorder, recurrent, in partial remission: Secondary | ICD-10-CM

## 2024-04-28 DIAGNOSIS — F411 Generalized anxiety disorder: Secondary | ICD-10-CM

## 2024-04-28 DIAGNOSIS — F4310 Post-traumatic stress disorder, unspecified: Secondary | ICD-10-CM

## 2024-04-29 ENCOUNTER — Other Ambulatory Visit: Payer: Self-pay | Admitting: Psychiatry

## 2024-04-29 DIAGNOSIS — F4310 Post-traumatic stress disorder, unspecified: Secondary | ICD-10-CM

## 2024-05-01 ENCOUNTER — Ambulatory Visit: Admitting: Licensed Clinical Social Worker

## 2024-05-02 ENCOUNTER — Ambulatory Visit: Admitting: Occupational Therapy

## 2024-05-02 ENCOUNTER — Ambulatory Visit

## 2024-05-02 ENCOUNTER — Ambulatory Visit (INDEPENDENT_AMBULATORY_CARE_PROVIDER_SITE_OTHER): Admitting: Licensed Clinical Social Worker

## 2024-05-02 DIAGNOSIS — F3341 Major depressive disorder, recurrent, in partial remission: Secondary | ICD-10-CM | POA: Diagnosis not present

## 2024-05-02 DIAGNOSIS — I890 Lymphedema, not elsewhere classified: Secondary | ICD-10-CM

## 2024-05-02 DIAGNOSIS — M6281 Muscle weakness (generalized): Secondary | ICD-10-CM

## 2024-05-02 DIAGNOSIS — M25651 Stiffness of right hip, not elsewhere classified: Secondary | ICD-10-CM

## 2024-05-02 DIAGNOSIS — M25551 Pain in right hip: Secondary | ICD-10-CM

## 2024-05-02 DIAGNOSIS — F411 Generalized anxiety disorder: Secondary | ICD-10-CM | POA: Diagnosis not present

## 2024-05-02 DIAGNOSIS — R262 Difficulty in walking, not elsewhere classified: Secondary | ICD-10-CM

## 2024-05-02 DIAGNOSIS — R2681 Unsteadiness on feet: Secondary | ICD-10-CM

## 2024-05-02 DIAGNOSIS — F4310 Post-traumatic stress disorder, unspecified: Secondary | ICD-10-CM

## 2024-05-02 NOTE — Therapy (Signed)
 OUTPATIENT PHYSICAL THERAPY TREATMENT   Patient Name: Elizabeth Martin MRN: [id number] DOB:May 09, 1961, 63 y.o., female Today's Date: 05/02/2024  END OF SESSION:  PT End of Session - 05/02/24 1023     Visit Number 24    Number of Visits 35    Date for PT Re-Evaluation 06/04/24    Authorization Type [employer]    Progress Note Due on Visit 30    PT Start Time 1015    PT Stop Time 1050    PT Time Calculation (min) 35 min    Equipment Utilized During Treatment Gait belt    Activity Tolerance Patient tolerated treatment well    Behavior During Therapy WFL for tasks assessed/performed                      Past Medical History:  Diagnosis Date   Anxiety    Asthma    Autoimmune disease (HCC)    Cancer (HCC)    Endometrial lymph node removal (30)   Corneal abrasion, right 05/24/2022   Depression    GERD (gastroesophageal reflux disease)    H/O blood clots    upper rt groin and behind both knees   History of hiatal hernia    Hypertension    Lymphedema    right leg   Morphea scleroderma    PONV (postoperative nausea and vomiting)    states gets violently ill   Past Surgical History:  Procedure Laterality Date   BASAL CELL CARCINOMA EXCISION Left    CHOLECYSTECTOMY  2008   FLEXIBLE SIGMOIDOSCOPY N/A 05/19/2022   Procedure: FLEXIBLE SIGMOIDOSCOPY;  Surgeon: Toledo, Alphonsus Jeans, MD;  Location: [facility];  Service: Gastroenterology;  Laterality: N/A;   HERNIA REPAIR  2004   ILEOSTOMY CLOSURE N/A 08/24/2022   Procedure: ILEOSTOMY TAKEDOWN, open loop with Apolonio Bay, PA-C to assist;  Surgeon: Emmalene Hare, MD;  Location: [facility];  Service: General;  Laterality: N/A;   IVC FILTER INSERTION N/A 11/09/2023   Procedure: IVC FILTER INSERTION;  Surgeon: Celso College, MD;  Location: [facility];  Service: Cardiovascular;  Laterality: N/A;   IVC FILTER REMOVAL N/A 01/02/2024   Procedure: IVC FILTER REMOVAL;  Surgeon: Celso College, MD;  Location: [facility];  Service: Cardiovascular;  Laterality: N/A;   PLANTAR FASCIECTOMY     ROBOTIC ASSISTED TOTAL HYSTERECTOMY WITH BILATERAL SALPINGO OOPHERECTOMY  02/14/2012   ROTATOR CUFF REPAIR Left 03/24/2022   TONSILLECTOMY     TOTAL HIP ARTHROPLASTY Right 11/14/2023   Procedure: TOTAL HIP ARTHROPLASTY - posterior;  Surgeon: Venus Ginsberg, MD;  Location: [facility];  Service: Orthopedics;  Laterality: Right;   XI ROBOTIC ASSISTED LOWER ANTERIOR RESECTION N/A 05/24/2022   Procedure: XI ROBOTIC ASSISTED LOWER ANTERIOR RESECTION;  Surgeon: Emmalene Hare, MD;  Location: [facility];  Service: General;  Laterality: N/A;   Patient Active Problem List   Diagnosis Date Noted   Constipation 01/10/2024   Nausea & vomiting 01/10/2024   S/P total right hip arthroplasty 11/14/2023   Blood coagulation disorder (HCC) 11/10/2023   PTSD (post-traumatic stress disorder) 11/10/2023   Severe episode of recurrent major depressive disorder, without psychotic features (HCC) 11/10/2023   GAD (generalized anxiety disorder) 11/10/2023   High risk medication use 11/10/2023   Anxiety and depression 03/22/2023   Cervical radiculopathy 03/16/2023   Colon cancer screening 03/15/2023   Chronic idiopathic constipation 03/15/2023   Dyspnea 03/07/2023   [place] spotted fever 03/07/2023   Strain of gastrocnemius tendon  03/07/2023   Lower abdominal pain 03/07/2023   Pain in pelvis 03/07/2023   Small bowel obstruction (HCC) 02/11/2023   Raynaud disease 02/11/2023   Polyarthralgia 02/11/2023   Edema 01/03/2023   Diverticular disease 12/10/2022   Hot flashes 12/10/2022   Pain of right lower extremity 12/10/2022   Paresthesia of lower extremity 12/10/2022   Primary insomnia 12/10/2022   Thyroid  nodule 12/10/2022   Multiple joint pain 12/10/2022   Basal cell carcinoma of skin 12/10/2022   Acute bilateral low back pain without sciatica 11/10/2022   Chronic lower back pain 09/05/2022   S/P closure of ileostomy 08/24/2022    Ileostomy in place [facility])    Morphea 07/13/2022   Colonic stricture (HCC) 05/19/2022   Large bowel obstruction (HCC) 05/19/2022   Hypokalemia 05/18/2022   Abdominal pain 05/18/2022   Osteoarthritis of left knee 02/24/2022   Mixed connective tissue disease (HCC) 02/17/2022   Adhesive capsulitis of left shoulder 01/27/2022   Glenohumeral arthritis, left 01/27/2022   Microscopic hematuria 01/12/2022   Otorrhagia of right ear 12/10/2021   Elevated testosterone level in female 11/04/2021   Anti-TPO antibodies present 10/20/2021   Rash 10/13/2021   Hashimoto thyroiditis, 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   Hashimoto's disease 03/23/2021   Exposure to severe acute respiratory syndrome coronavirus 2 (SARS-CoV-2) 12/19/2020   Migraine without aura and without status migrainosus, not intractable 08/19/2020   Myofascial pain 07/30/2020   Neck pain 07/30/2020   Upper back pain 07/30/2020   Cervical facet joint syndrome 07/30/2020   Hair loss 07/14/2020   Edema of lower extremity 07/11/2020   Adenomatous colon polyp 02/26/2020   Gastroesophageal reflux disease 02/15/2020   Diarrhea 02/15/2020   Sleep disorder 01/16/2020   Hyperlipidemia 07/11/2019   Right shoulder pain 04/04/2019   Pre-operative clearance 12/13/2018   Endometrial cancer (HCC) 04/05/2018   Patellofemoral pain syndrome of right knee 03/20/2018   Gastroenteritis 12/07/2017   Fatigue 11/16/2017   Morbid obesity (HCC) 08/11/2017   Stucco keratoses 08/11/2017   Essential hypertension 08/09/2017   History of endometrial cancer 08/09/2017   Lymphedema 08/09/2017   Menopause 08/09/2017   Malignant neoplastic disease (HCC) 04/07/2017   Cellulitis 09/18/2016   Benign hypertension 06/02/2016   Asthma 06/04/1969    PCP: Eather Golder MD   REFERRING PROVIDER: Eather Golder MD   REFERRING DIAG: s/p R hip replacement  THERAPY DIAG:  Muscle weakness (generalized)  Stiffness of right hip, not elsewhere classified  Pain in  right hip  Difficulty in walking, not elsewhere classified  Unsteadiness on feet  Rationale for Evaluation and Treatment: Rehabilitation  ONSET DATE: 11/14/23  SUBJECTIVE:   SUBJECTIVE STATEMENT:     Patient reports feeling better than last week. "Coming out of my flare up so doing better." Reports pain at 2/10 right hip     PERTINENT HISTORY: s/p R hip arthropaslasty 11/14/23 with Dr. Clyda Dark posterior approach, blood coagulation disorder, PTSD, anxiety, cervical radiculopathy dypsnea, polyarthralgia, Raynaud disease, s/p closure of ileostomy, Hashimoto thyroid , endometrial cancer, menopause, endometrial cancer, lymphedema. Patient had home health PT prior to PT here.   PAIN:  Are you having pain?  2/10 Rt posterior deep hip and medial posterior gluteals   PRECAUTIONS: Other: recovering from hip sx  RED FLAGS: None   WEIGHT BEARING RESTRICTIONS: No  FALLS:  Has patient fallen in last 6 months? Yes. Number of falls 2  LIVING ENVIRONMENT: Lives with: lives with their spouse Lives in: House/apartment Stairs: No Has following equipment at home: Single point cane  OCCUPATION:  retired,   PLOF: Independent  PATIENT GOALS: return to hiking, working out, gardening.   NEXT MD VISIT: March 2025   OBJECTIVE:  Note: Objective measures were completed at Evaluation unless otherwise noted.  DIAGNOSTIC FINDINGS:  IMPRESSION: "Right hip replacement.  No visible complicating feature."  PATIENT SURVEYS:  LEFS 5/80  COGNITION: Overall cognitive status: Within functional limits for tasks assessed    MUSCLE LENGTH: Hamstrings: limited bilaterally with R>L   LOWER EXTREMITY ROM:  Passive ROM Right  04/02/24 Right eval Left eval  Hip flexion >90 degrees 48 flexion PROM supine   Hip extension 0 degrees    Hip abduction ~25 degrees 5 degrees AROM     (Blank rows = not tested)  LOWER EXTREMITY MMT:  MMT Right 04/02/24 Right eval Left eval  Hip flexion 4/5 2+ 5  Hip  extension (supine 30 deg) 2-/5   5  Hip Hor ADD 5/5    Hip abduction 2+/5 2+*  5  Hip Hor ABDCT 4+/5    Hip adduction  2+ 5  Hip internal rotation 4+/5    Hip external rotation 3-/5     Knee flexion 5/5 3 5   Knee extension 4+/5 3 5   Ankle dorsiflexion 5/5 3 5   Ankle plantarflexion 5/5 3 5    (Blank rows = not tested)   FUNCTIONAL TESTS:  5 times sit to stand: 29 6 minute walk test:  10 meter walk test: 15 seconds with SPC   GAIT: Distance walked: 60 ft Assistive device utilized: Single point cane Level of assistance: CGA Comments: antalgic gait pattern with decreased stance time RLE. Slight vaulting pattern.  Stair negotiation: -step to pattern lead with LLE, one hand on cane one hand on rail ascending. Descending step to pattern down with RLE.                                                                                                                               TREATMENT DATE:  05/02/24  Therex:  Standing hamstring stretch hold 30 sec x 3 each LE Standing hip flexor stretch hold 30 sec x 3 each LE Standing calf stretch - hold 30 sec x 3   TA: Hip swing walk in // bars- (ER/IR) down and back x 5 High knee march walk (slow and controlled working more on SLS) x4 with retro steps backward x 4 Fwd lunge walk - down and back x 4 Walking ham curl- down and back x 4 Side step +minisquat Reach (SLS) + golfer lean to pick up then put down 1/2 spike ball on Vertically placed step stool x 5 each LE then 8 more each LE picking ball up from seat of rolling stool.  Standing calf raises on 1/2 foam roll 2 x 10 reps          PATIENT EDUCATION:  Education details: exercise technique Person educated: Patient Education method: Explanation, Demonstration, Tactile cues, and Verbal cues Education comprehension: verbalized understanding,  returned demonstration, verbal cues required, tactile cues required, and needs further education  HOME EXERCISE PROGRAM:  Access Code:  [facility] URL: [email or web address] Date: 04/11/2024 Prepared by: Ferrell Hu  Exercises - Side Stepping with Counter Support  - 1 x daily - 7 x weekly - 2 sets - 10 reps - 5 hold - Standing Hip Extension  - 1 x daily - 7 x weekly - 2 sets - 10 reps - 5 hold - Standing March with Counter Support  - 1 x daily - 7 x weekly - 2 sets - 10 reps - 5 hold - Standing Hip Abduction with Counter Support  - 1 x daily - 7 x weekly - 2 sets - 10 reps - Seated Hamstring Stretch  - 1 x daily - 7 x weekly - 2 sets - 2 reps - 30 hold - Sit to Stand  - 1 x daily - 7 x weekly - 2 sets - 10 reps - 5 hold - Supine Heel Slide  - 1 x daily - 7 x weekly - 2 sets - 6 reps - Supine Gluteal Sets  - 1 x daily - 7 x weekly - 2 sets - 10 reps - Supine Bridge  - 1 x daily - 7 x weekly - 3 sets - 5 reps - 2-3sec hold - Supine Short Arc Quad  - 1 x daily - 7 x weekly - 3 sets - 15 reps - Small Range Straight Leg Raise  - 1 x daily - 7 x weekly - 3 sets - 5 reps - Hooklying Clamshell with Resistance  - 1 x daily - 7 x weekly - 3 sets - 15 reps - Prone Hip Extension with Bent Knee  - 1 x daily - 7 x weekly - 3 sets - 10 reps - Prone Hip Extension Sequence  - 1 x daily - 7 x weekly - 3 sets - 10 reps - Supine Hamstring Stretch with Strap  - 1-2 x daily - 3 sets - 30sec hold - Modified Thomas Stretch  - 1-2 x daily - 3 sets - 30 sec hold - Hip Flexor Stretch on Step  - 1-2 x daily - 3 sets - 30 sec hold - Standing Hamstring Stretch with Step  - 1 x daily - 7 x weekly - 3 sets - 30 sec hold - Supine Figure 4 Piriformis Stretch  - 1-2 x daily - 3 sets - 30 hold - Supine Piriformis Stretch  - 1-2 x daily - 3 sets - 30 sec hold        Access Code: [facility] URL: [email or web address] Date: 02/08/2024; updated 03/21/2024 Prepared by: [employer]  Exercises - Side Stepping with Counter Support  - 1 x daily - 7 x weekly - 2 sets - 10 reps - 5 hold - Standing Hip  Extension  - 1 x daily - 7 x weekly - 2 sets - 10 reps - 5 hold - Standing March with Counter Support  - 1 x daily - 7 x weekly - 2 sets - 10 reps - 5 hold - Standing Hip Abduction with Counter Support  - 1 x daily - 7 x weekly - 2 sets - 10 reps - Seated Hamstring Stretch  - 1 x daily - 7 x weekly - 2 sets - 2 reps - 30 hold - Sit to Stand  - 1 x daily - 7 x weekly - 2 sets - 10 reps - 5 hold - Supine Heel  Slide  - 1 x daily - 7 x weekly - 2 sets - 6 reps - Supine Gluteal Sets  - 1 x daily - 7 x weekly - 2 sets - 10 reps - Supine Bridge  - 1 x daily - 7 x weekly - 3 sets - 5 reps - 2-3sec hold - Supine Short Arc Quad  - 1 x daily - 7 x weekly - 3 sets - 15 reps - Small Range Straight Leg Raise  - 1 x daily - 7 x weekly - 3 sets - 5 reps - Hooklying Clamshell with Resistance  - 1 x daily - 7 x weekly - 3 sets - 15 reps - Prone Hip Extension with Bent Knee  - 1 x daily - 7 x weekly - 3 sets - 10 reps - Prone Hip Extension Sequence  - 1 x daily - 7 x weekly - 3 sets - 10 reps  ASSESSMENT: CLINICAL IMPRESSION:    Patient continues to respond well to all activities. She performed well despite being ongoing lymphedema in RLE. She is able to stretch and mobilize her hip and several times - stating the exercises actually felt good. She is performing well and will add more balance activities next visit.  Pt will continue to benefit from skilled therapy to address remaining deficits in order to improve overall QoL and return to PLOF.      OBJECTIVE IMPAIRMENTS: Abnormal gait, decreased activity tolerance, decreased balance, decreased coordination, decreased endurance, decreased mobility, difficulty walking, decreased ROM, decreased strength, hypomobility, increased edema, impaired perceived functional ability, impaired flexibility, improper body mechanics, postural dysfunction, and pain.   ACTIVITY LIMITATIONS: carrying, lifting, bending, sitting, standing, squatting, sleeping, stairs, transfers, bed  mobility, bathing, toileting, dressing, reach over head, hygiene/grooming, locomotion level, and caring for others  PARTICIPATION LIMITATIONS: meal prep, cleaning, laundry, interpersonal relationship, driving, shopping, community activity, and yard work  PERSONAL FACTORS: Age, Past/current experiences, Time since onset of injury/illness/exacerbation, and 3+ comorbidities: blood coagulation disorder, PTSD, anxiety, cervical radiculopathy dypsnea, polyarthralgia, Raynaud disease, s/p closure of ileostomy, Hashimoto thyroid , endometrial cancer, menopause, endometrial cancer, lymphedema are also affecting patient's functional outcome.   REHAB POTENTIAL: Good  CLINICAL DECISION MAKING: Evolving/moderate complexity  EVALUATION COMPLEXITY: Moderate   GOALS: Goals reviewed with patient? Yes  SHORT TERM GOALS: Target date: 01/19/2024  Patient will be independent in home exercise program to improve strength/mobility for better functional independence with ADLs. Baseline:1/30: HEP given 3/12: pt reports completing 3-4 days a week depending on soreness.  Goal status:  MET  LONG TERM GOALS: Target date: 06/04/2024  Patient (> 72 years old) will complete five times sit to stand test in < 10 seconds indicating an increased LE strength and improved balance. Baseline: 1/30: 29 seconds 3/12: 19.41 03/12/2024= 17.44 seconds- without UE support; 04/16/2024- 17.1 sec without UE support.  Goal status: PROGRESSING  2.  Patient will increase six minute walk test distance to >1000 for progression to community ambulator and improve gait ability Baseline: 430 ft in 5 min and required seated rest to end test due to fatigue, uses SPC 877ft with SPC and no standing or seated rest break; 03/12/2024= 950 feet with SPC; 04/16/2024= 870 without an AD Goal status: in progress   3.  Patient will increase 10 meter walk test to >1.50m/s as to improve gait speed for better community ambulation and to reduce fall risk. Baseline:  1/30: 15 seconds with SPC  Without SPC: 10.63 and 10.38 speed: 0.95 m/s; with SPC 10.98 and  11.18; 03/12/2024= 1.0 m/s; 04/16/2024= 0.93 m/s without SPC Goal status: PROGRESSING  4.  Patient will increase lower extremity functional scale to >60/80 to demonstrate improved functional mobility and increased tolerance with ADLs.  Baseline: 1/30: 5/80 3/12: 20/80; 03/12/2024= 29/80; 04/16/2024=13/80 Goal status: Progressing  PLAN:  PT FREQUENCY: 1-2x/week PT DURATION: 12 weeks PLANNED INTERVENTIONS: 97164- PT Re-evaluation, 97110-Therapeutic exercises, 97530- Therapeutic activity, 97112- Neuromuscular re-education, 97535- Self Care, 96295- Manual therapy, 97116- Gait training, [phone]- Splinting, 97014- Electrical stimulation (unattended), [phone]- Electrical stimulation (manual), 97035- Ultrasound, [id number]- Traction (mechanical), Patient/Family education, Balance training, Stair training, Taping, Joint mobilization, Joint manipulation, Spinal manipulation, Spinal mobilization, Manual lymph drainage, Scar mobilization, Compression bandaging, Vestibular training, Visual/preceptual remediation/compensation, DME instructions, Cryotherapy, and Moist heat  PLAN FOR NEXT SESSION:   Continue with gentle hip stretching Progress with RLE strengthening  Continue with progressive Dynamic balance- tandem, narrowed BOS, SLS   11:20 AM, 05/02/24 Ossie Blend, PT Physical Therapist - [facility]     05/02/24, 11:20 AM

## 2024-05-02 NOTE — Therapy (Signed)
 OUTPATIENT OCCUPATIONAL THERAPY TREATMENT NOTE  LOWER EXTREMITY LYMPHEDEMA  Patient Name: Elizabeth Martin MRN: [id number] DOB:12-24-1960, 63 y.o., female Today's Date: 05/02/2024   END OF SESSION:  Lymphedema Episode 2   OT End of Session - 05/02/24 1224     Visit Number 35    Number of Visits 36    Date for OT Re-Evaluation 07/17/24    OT Start Time 1053    OT Stop Time 1210    OT Time Calculation (min) 77 min    Activity Tolerance Patient tolerated treatment well;No increased pain    Behavior During Therapy WFL for tasks assessed/performed              Past Medical History:  Diagnosis Date   Anxiety    Asthma    Autoimmune disease (HCC)    Cancer (HCC)    Endometrial lymph node removal (30)   Corneal abrasion, right 05/24/2022   Depression    GERD (gastroesophageal reflux disease)    H/O blood clots    upper rt groin and behind both knees   History of hiatal hernia    Hypertension    Lymphedema    right leg   Morphea scleroderma    PONV (postoperative nausea and vomiting)    states gets violently ill   Past Surgical History:  Procedure Laterality Date   BASAL CELL CARCINOMA EXCISION Left    CHOLECYSTECTOMY  2008   FLEXIBLE SIGMOIDOSCOPY N/A 05/19/2022   Procedure: FLEXIBLE SIGMOIDOSCOPY;  Surgeon: Toledo, Alphonsus Jeans, MD;  Location: [facility];  Service: Gastroenterology;  Laterality: N/A;   HERNIA REPAIR  2004   ILEOSTOMY CLOSURE N/A 08/24/2022   Procedure: ILEOSTOMY TAKEDOWN, open loop with Apolonio Bay, PA-C to assist;  Surgeon: Emmalene Hare, MD;  Location: [facility];  Service: General;  Laterality: N/A;   IVC FILTER INSERTION N/A 11/09/2023   Procedure: IVC FILTER INSERTION;  Surgeon: Celso College, MD;  Location: [facility];  Service: Cardiovascular;  Laterality: N/A;   IVC FILTER REMOVAL N/A 01/02/2024   Procedure: IVC FILTER REMOVAL;  Surgeon: Celso College, MD;  Location: [facility];  Service: Cardiovascular;  Laterality:  N/A;   PLANTAR FASCIECTOMY     ROBOTIC ASSISTED TOTAL HYSTERECTOMY WITH BILATERAL SALPINGO OOPHERECTOMY  02/14/2012   ROTATOR CUFF REPAIR Left 03/24/2022   TONSILLECTOMY     TOTAL HIP ARTHROPLASTY Right 11/14/2023   Procedure: TOTAL HIP ARTHROPLASTY - posterior;  Surgeon: Venus Ginsberg, MD;  Location: [facility];  Service: Orthopedics;  Laterality: Right;   XI ROBOTIC ASSISTED LOWER ANTERIOR RESECTION N/A 05/24/2022   Procedure: XI ROBOTIC ASSISTED LOWER ANTERIOR RESECTION;  Surgeon: Emmalene Hare, MD;  Location: [facility];  Service: General;  Laterality: N/A;   Patient Active Problem List   Diagnosis Date Noted   Constipation 01/10/2024   Nausea & vomiting 01/10/2024   S/P total right hip arthroplasty 11/14/2023   Blood coagulation disorder (HCC) 11/10/2023   PTSD (post-traumatic stress disorder) 11/10/2023   Severe episode of recurrent major depressive disorder, without psychotic features (HCC) 11/10/2023   GAD (generalized anxiety disorder) 11/10/2023   High risk medication use 11/10/2023   Anxiety and depression 03/22/2023   Cervical radiculopathy 03/16/2023   Colon cancer screening 03/15/2023   Chronic idiopathic constipation 03/15/2023   Dyspnea 03/07/2023   [facility] spotted fever 03/07/2023   Strain of gastrocnemius tendon 03/07/2023   Lower abdominal pain 03/07/2023   Pain in pelvis 03/07/2023   Small bowel obstruction (HCC) 02/11/2023  Raynaud disease 02/11/2023   Polyarthralgia 02/11/2023   Edema 01/03/2023   Diverticular disease 12/10/2022   Hot flashes 12/10/2022   Pain of right lower extremity 12/10/2022   Paresthesia of lower extremity 12/10/2022   Primary insomnia 12/10/2022   Thyroid  nodule 12/10/2022   Multiple joint pain 12/10/2022   Basal cell carcinoma of skin 12/10/2022   Acute bilateral low back pain without sciatica 11/10/2022   Chronic lower back pain 09/05/2022   S/P closure of ileostomy 08/24/2022   Ileostomy in place [facility])    Morphea  07/13/2022   Colonic stricture (HCC) 05/19/2022   Large bowel obstruction (HCC) 05/19/2022   Hypokalemia 05/18/2022   Abdominal pain 05/18/2022   Osteoarthritis of left knee 02/24/2022   Mixed connective tissue disease (HCC) 02/17/2022   Adhesive capsulitis of left shoulder 01/27/2022   Glenohumeral arthritis, left 01/27/2022   Microscopic hematuria 01/12/2022   Otorrhagia of right ear 12/10/2021   Elevated testosterone level in female 11/04/2021   Anti-TPO antibodies present 10/20/2021   Rash 10/13/2021   Hashimoto thyroiditis, 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   Hashimoto's disease 03/23/2021   Exposure to severe acute respiratory syndrome coronavirus 2 (SARS-CoV-2) 12/19/2020   Migraine without aura and without status migrainosus, not intractable 08/19/2020   Myofascial pain 07/30/2020   Neck pain 07/30/2020   Upper back pain 07/30/2020   Cervical facet joint syndrome 07/30/2020   Hair loss 07/14/2020   Edema of lower extremity 07/11/2020   Adenomatous colon polyp 02/26/2020   Gastroesophageal reflux disease 02/15/2020   Diarrhea 02/15/2020   Sleep disorder 01/16/2020   Hyperlipidemia 07/11/2019   Right shoulder pain 04/04/2019   Pre-operative clearance 12/13/2018   Endometrial cancer (HCC) 04/05/2018   Patellofemoral pain syndrome of right knee 03/20/2018   Gastroenteritis 12/07/2017   Fatigue 11/16/2017   Morbid obesity (HCC) 08/11/2017   Stucco keratoses 08/11/2017   Essential hypertension 08/09/2017   History of endometrial cancer 08/09/2017   Lymphedema 08/09/2017   Menopause 08/09/2017   Malignant neoplastic disease (HCC) 04/07/2017   Cellulitis 09/18/2016   Benign hypertension 06/02/2016   Asthma 06/04/1969    PCP: Eather Golder, MD  REFERRING PROVIDER: Eather Golder, MD  REFERRING DIAG: I89.0   THERAPY DIAG:  Lymphedema, not elsewhere classified  Rationale for Evaluation and Treatment: Rehabilitation  ONSET DATE: 2013  (Cancer-related, endometrial  2013)  SUBJECTIVE:                                                                                                                                                                                           SUBJECTIVE STATEMENT:Patient returns to OT for lymphedema care to RLE/RLQ.  Pt  rates LE-related pain at 3/10 today. Pt had PT before OT session today.    PERTINENT HISTORY: Asthma, Autoimmune Disease (Connective Tissue Disease), Endometrial Ca w/ LND ( 30 bilateral pelvic and periaortic LN), adjuvant chemotherapy and XRT,   H/O blood clots, RLE/RLQ lymphedema, Robotic assisted total hysterectomy w/ bilateral oophorectomy L Rotator Cuff repair, S/P ileostomy 2/2 colon stricture 05/2022; s/p R hip arthroplasty 11/11/23, scleroderma morphia bilateral groin  PAIN:  Are you having pain? YES. See subjective; 3/10 Pain location: RLE, R hip, cervical spine Pain description: sore, aching, heavy, full, tight,  Aggravating factors: standing, walking, extended dependent sitting Relieving factors: elevation, movement  PRECAUTIONS: Fall and Other: LYMPHEDEMA  WEIGHT BEARING RESTRICTIONS: Yes 5# lifting restriction- shoulder  FALLS:  Has patient fallen in last 6 months? No  LIVING ENVIRONMENT: Lives with: lives with their spouse Lives in: House/apartment Stairs: No;  Has following equipment at home: Single point cane, Environmental consultant - 2 wheeled, Environmental consultant - 4 wheeled, and Wheelchair (manual)  OCCUPATION: retired Control and instrumentation engineer: gardening, hiking, biking, dancing- unable to participate in any of these due to pain and swelling  HAND DOMINANCE: right   PRIOR LEVEL OF FUNCTION: Independent with basic ADLs, Independent with household mobility without device, Independent with community mobility without device, Requires assistive device for independence, Needs assistance with homemaking, Needs assistance with transfers, and Leisure: decreased  social participation for leisure pursuits due to impaired mobility  and pain  PATIENT GOALS:  Be able to move freely without pain Reduce limb volume to be able to lift limb for basic daily ADLs and functional ambulation Reduce limb volume to increase ability to perform lower body dressing and bathing and grooming Reduce limb to increase body image  OBJECTIVE: Note: Objective measures were completed at Evaluation unless otherwise noted.  COGNITION:  Overall cognitive status: Within functional limits for tasks assessed   OBSERVATIONS / OTHER ASSESSMENTS:   POSTURE: WFL  LE ROM: WFL, but Limited mildly at R knee and ankle due to girth, skin approximation, and joint pain  LE MMT: WFL  Mild, Stage  II, Bilateral Lower Extremity Lymphedema 2/2 CVI and Obesity  Skin  Description Hyper-Keratosis Peau d' Orange Shiny Tight Fibrotic/ Indurated Fatty Doughy Spongy/ boggy   x x x x R>L Severe morphea at  R groin obstructing lymphatics   x   Skin dry Flaky WNL Macerated   mildly sclerotic     Color Redness Varicosities Blanching Hemosiderin Stain Mottled   x x x   x   Odor Malodorous Yeast Fungal infection  WNL      x   Temperature Warm Cool wnl    x     Pitting Edema   1+ 2+ 3+ 4+ Non-pitting         x   Girth Symmetrical Asymmetrical                   Distribution    R>L RLE toes to groin    Stemmer Sign Positive Negative   +    Lymphorrhea History Of:  Present Absent     x    Wounds History Of Present Absent Venous Arterial Pressure Sheer     x        Signs of Infection Redness Warmth Erythema Acute Swelling Drainage Borders                    Sensation Light Touch Deep pressure Hypersensitivity   In tact Impaired In tact Impaired Absent  Impaired   x  x  x     Nails WNL   Fungus nail dystrophy   x     Hair Growth Symmetrical Asymmetrical    R>L   Skin Creases Base of toes  Ankles   Base of Fingers knees       Abdominal pannus Thigh Lobules  Face/neck   x x  x        BLE COMPARATIVE LIMB VOLUMETRICS  10/10/23  LANDMARK RIGHT  10/10/23  R LEG (A-D) 5466.5 ml  R THIGH (E-G) 9186.6 ml  R FULL LIMB (A-G) 14653.1 ml  Limb Volume differential (LVD)  LVD for LEG = 44.1%, R>L LVD for THIGH= 33.98%, R>L LVD for FULL lower extremity 37.8%, R>L  Volume change since last 08/11/22 R LEG is INCREASED in volume by 59.6%. L THIGH is INCREASED by 38 %, and RLE full limb is INCREASED by 45.38%.  Volume change overall V  (Blank rows = not tested)  LANDMARK LEFT  10/12/23  L LEG (A-D) 3053.6 ml  L THIGH (E-G) 6064.8 ml  L  FULL LIMB (A-G) 9118.4 ml  Limb Volume differential (LVD)  %  Volume change since initial %  Volume change overall %  (Blank rows = not tested)   10 th VISIT PROGRESS NOTE RLE COMPARATIVE LIMB VOLUMETRICS 01/04/24: Deferred until next visit by Pt request.  LANDMARK RIGHT    R LEG (A-D) TBA  R THIGH (E-G) TBA  R FULL LIMB (A-G) TBA  Limb Volume differential (LVD)    Volume change since last 08/11/22 TBA  Volume change overall TBA  (Blank rows = not tested)  20 th VISIT PROGRESS NOTE RLE COMPARATIVE LIMB VOLUMETRICS TBA next session  LANDMARK RIGHT    R LEG (A-D) 6005.7 ml  R THIGH (E-G) 9400.3 ml  R FULL LIMB (A-G) 15406.02 ml  Limb Volume differential (LVD)    Volume change since last measured on 10/10/23 R LEG is INCREASED in volume by 9.9%. L THIGH is INCREASED by 2.32 %, and RLE full limb is INCREASED by 5.13% since last measured on 10/10/23.  Volume change since commencing OT for CDT %    28 th VISIT PROGRESS NOTE RLE COMPARATIVE LIMB VOLUMETRICS TBA next session  LANDMARK RIGHT    R LEG (A-D) 5936.1 ml  R THIGH (E-G) 8538.6  ml  R FULL LIMB (A-G) 14524.7  ml  Limb Volume differential (LVD)    Volume change since last measured on 10/10/23 R LEG is DECREASED in volume by 1.2 %.  Since last measured on 03/14/24. R THIGH is DECREASED by 8.6 %, and RLE full limb is DECREASED by 5.72 % since last measured on 03/14/24.  Volume change since commencing OT for CDT %   ENDOMETRIAL  CA-related LYMPHEDEMA Hx:  SURGERY TYPE/DATE: 2013 NUMBER OF LYMPH NODES REMOVED: 30 by report- bilateral pelvic and periaortic CHEMOTHERAPY: yes RADIATION:yes INFECTIONS: no hx cellulitis  LYMPHEDEMA LIFE IMPACT SCALE (LLIS): Intake 10/12/23 75% (The extent to which lymphedema related problems impacted your life over the past week)  FOTO (functional outcome measure):10/10/23 INTAKE: 36% No Out take score. FOTO assessment discontinued by clinic.  PATIENT EDUCATION:  Education details: Continued Pt/ CG edu for lymphedema self care home program throughout session. Topics include outcome of comparative limb volumetrics- starting limb volume differentials (LVDs), technology and gradient techniques used for short stretch, multilayer compression wrapping, simple self-MLD, therapeutic lymphatic pumping exercises, skin/nail care, LE precautions,. compression garment recommendations and specifications, wear and care  schedule and compression garment donning / doffing w assistive devices. Discussed progress towards all OT goals since commencing CDT. All questions answered to the Pt's satisfaction. Good return. Person educated: Patient Education method: Explanation, Demonstration, and Handouts Education comprehension: verbalized understanding, returned demonstration, and needs further education  LE SELF-CARE HOME  PROGRAM: Simple self-MLD/daily to affected quadrant and body part At least 2 x daily BLE Lymphatic Pumping There ex 1 set of 10 reps each, in order, bilaterally Daily skin care to affected body part to limit infection risk and increase skin excursion Compression Bandaging Intensive stage compression: multilayer short stretch wraps with gradient techniques. One limb at a time. Length patient dependent. Self-management Phase: Appropriate daytime compression garment and hours-of-sleep device  Compression garments: Custom-made gradient compression garments and hours-of-sleep devices are medically  necessary because they are uniquely sized and shaped to fit the exact dimensions of the affected extremities and to provide accurate and consistent gradient compression and containment, essential  for optimally managing chronic, progressive lymphedema. The convoluted HOS devices are medically necessary to facilitate increased lymphatic circulation and limit fibrosis formation when sleeping. Multiple custom compression garments are needed for optimal hygiene to limit infection risk. Custom compression garments should be replaced q 3-6 months When worn consistently for optimal lipo-lymphedema self-management over time.  ASSESSMENT:  CLINICAL IMPRESSION:  Continued RLE/RLQ MLD in side lying and supine as established working proximally to distal from neck, deep abdominal pathways, functional axillary LN, and J strokes  from thigh to toes. Pt tolerated MLD and deeper fibrosis techniques without increased pain.   At present RLE/RLQ is intractable to CDT.  Limb density and swelling are minimally reduced and softened since commencing CDT after hip replacement . 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 Lymphedema- related skin changes include below the knee extending to the ankle,  hyperkeratosis on the R foot and small, lymphatic cysts on the leg, and finally a thickened rough patch at base of toes    After MLD we Applied LLE multilayer compression wraps as established. We added closed cell foam kidneys to both medial and lateral malleoli. Continue to support Pt in her efforts to obtain medically necessary custom compression garments and devices. Cont CDT 3 x weekly as per POC.  10/10/23 Lymphedema Episode 2, Initial OT Evaluation : Holle Sprick is a 24 y a female presenting with chronic, progressive, moderate, Stage II, RLE/RLQ cancer -related lymphedema with onset 11 years ago after Rx for  endometrial cancer. Pt is well known to this therapist as she has successfully undergone Complete Decongestive Therapy (CDT)  this clinic bin the past. Pt reports recent exacerbation of RLE swelling worsened as inflammation in L hip has worsened over time.  Pt has a hip replacement scheduled in late December here it [facility]. RE limb volumetrics today reveal that R LEG volume is dramatically increased by 59.65 since last visit. R LEG VOLUME is INCREASED in volume by 59.6%. L THIGH is INCREASED by 38 %, and RLE full limb is INCREASED by 45.38%. Pt presents with LE-related skin changes. Prolonged inflammation in the R hip joint has likely overloaded  already RLE/RLQ lymphatics.  RLE/RLQ lymphedema limits Pt's ability to perform basic and instrumental ADLs, including functional ambulation, mobility and transfers, grooming , lower body bathing and dressing, skin inspection and skin care. Pt has difficulty  reaching her lower legs and feet to apply compression wraps and don/doff  compression stockings. LE limits ability to P[perform instrumental ADLs, including driving, yard work and home management activities. Lymphedema limits her ability to participate in leisure pursuits and productive activities, and it negatively impacts body image, life roles and quality of life.   Pt will benefit from an Intensive and follow along course of lymphedema. CDT will consist of Manual Lymphatic gainage   (MLD), skin care, therapeutic exercise and compression, wraps initially then garments. Pt will need assistance throughout CDT   for applying compression wraps due to limited hip AROM and decreased skin flexibility. Without skilled Occupational Therapy for lymphedema care, lymphedema will progress and further functional decline is expected.   In prep for upcoming hip replacement OT will educate Pt re assistive devices, including tub transfer bench and elevated toilet seat, in keeping with hip precautions.  OBJECTIVE  IMPAIRMENTS: Abnormal gait, decreased activity tolerance, decreased balance, decreased knowledge of use of DME, decreased mobility, difficulty walking, decreased ROM, decreased strength, increased edema, decreased skin flexibility, increased fascial restrictions, impaired sensation, pain, and chronic, progressive LLE/LLQ lymphatic swelling and associated pain.   ADL LIMITATIONS: carrying, lifting, bending, sitting, standing, squatting, sleeping, stairs, transfers, bed mobility, bathing, dressing, hygiene/grooming, and productive activities, leisure pursuits, social participation, body image driving, shopping, housework, yard work, cooking, meal prep  PERSONAL FACTORS: Past/current experiences and 3+ comorbidities: Morphea Scleroderma, Mixed connective tissue disease, and osteoarthritis are also affecting patient's functional outcome.   REHAB POTENTIAL: Good  EVALUATION COMPLEXITY: Moderate  GOALS: Goals reviewed with patient? Yes  SHORT TERM GOALS: Target date: 4th OT Rx visit   Pt will demonstrate understanding of lymphedema precautions and prevention strategies with modified independence using a printed reference to identify at least 5 precautions and discussing how s/he may implement them into daily life to reduce risk of progression with extra time.  Baseline:Max A Goal status: GOAL MET  2.  Pt will be able to apply multilayer, knee length, gradient, compression wraps to one leg at a time with modified assistance (extra time and assistive device/s) to decrease limb volume, to limit infection risk, and to limit lymphedema progression.  Baseline: Dependent Goal status: GOAL MET  LONG TERM GOALS: Target date: 01/09/24 (12 weeks)  Given this patient's Intake score 36% on the functional outcomes FOTO tool, patient will experience an increase in function of 3 points to improve basic and instrumental ADLs performance, including lymphedema self-care.  Baseline: Max A Goal status: DEFERRED as [employer]  has been discontinued  2.  Given this patient's Intake score of  75% on the Lymphedema Life Impact Scale (LLIS), patient will experience a reduction of at least 5 points in her perceived level of functional impairment resulting from lymphedema to improve functional performance and quality of life (QOL). Baseline: % Goal status: PROGRESSING  3.  Pt will achieve at least a 10% volume reduction in full, RLE limb volume to return limb to typical size and shape, to limit infection risk and LE progression, to decrease pain, to improve function. Baseline: Dependent Goal status:PROGRESSING. See chart   4.  Pt will obtain proper compression garments/devices and achieve modified independence (extra time + assistive devices) with donning/doffing to optimize limb volume reductions and limit LE  progression over time. Baseline:  Goal status: 01/04/24: PARTIALLY MET; In an effort to reduce burden of care on her spouse, Pt obtained adjustable, Velcro style R full leg, Circaid leg reduction kit for use s/p THA, but this proved not effective for controlling swelling, and were not easier to don and  doff more independently. We've resumed wrapping until she is able to fit back into custom compression garments. 02/14/24: Ongoing 04/11/24: Submitted new garment measurements to DME vendor for 3 piece system to address worsening lymphedema. Pt's insurance company denied coverage. She in the process of appealing.  5.  During Intensive phase CDT , with modified independence, Pt will achieve at least 85% compliance with all lymphedema self-care home program components, including daily skin care, compression wraps and /or garments, simple self MLD and lymphatic pumping therex to habituate LE self care protocol  into ADLs for optimal LE self-management over time. Baseline: Dependent Goal status: 02/14/24: GOAL MET and EXCEEDED PLAN:  OT FREQUENCY: 2-3 x/week  OT DURATION: 12 weeks and PRN  PLANNED INTERVENTIONS: Complete  Decongestive Therapy (Intensive and supported Self-Management Phases), 97110-Therapeutic exercises, 97530- Therapeutic activity, 97535- Self Care, [id number]- Manual therapy, Patient/Family education, Taping, Manual lymph drainage, Scar mobilization, Compression bandaging, DME instructions, and skin care to reduce infection risk throughout manual therapy. Fit with replacement compression garments ASAP  PLAN FOR NEXT SESSION:  Cont MLD as established Cont multilayer compression wrapping   Arnold Bicker, MS, OTR/L, CLT-LANA 05/02/24 12:27 PM

## 2024-05-02 NOTE — Progress Notes (Signed)
 THERAPIST PROGRESS NOTE  Virtual Visit via Video Note  I connected with Elizabeth Martin on 05/02/24 at  8:00 AM EDT by a video enabled telemedicine application and verified that I am speaking with the correct person using two identifiers.  Location: Patient: Address on file  Provider: [facility]   I discussed the limitations of evaluation and management by telemedicine and the availability of in person appointments. The patient expressed understanding and agreed to proceed.    I discussed the assessment and treatment plan with the patient. The patient was provided an opportunity to ask questions and all were answered. The patient agreed with the plan and demonstrated an understanding of the instructions.   The patient was advised to call back or seek an in-person evaluation if the symptoms worsen or if the condition fails to improve as anticipated.  I provided 56 minutes of non-face-to-face time during this encounter.   [name], LCSW   Session Time: 8:03am-8:59am  Participation Level: Active  Behavioral Response: CasualAlertEuthymic  Type of Therapy: Individual Therapy  Treatment Goals addressed:  Template: Depression (OP)                                                                                                                                                    Goal: LTG: Reduce frequency, intensity, and duration of depression symptoms so that daily functioning is improved                                                                                       Goal: STG: [place]ston will identify cognitive patterns and beliefs that support depression                                                                                       Goal: LTG: Pt identifies the goal to Accept chronic pain and learning coping skills  Template: PTSD (OP)                                                                                                                          Goal: LTG: Elimination of maladaptive behaviors and thinking patterns which interfere with resolution of trauma as evidenced by self report                                                                                       Goal: LTG: Develop and implement effective coping skills to carry out normal responsibilities and participate constructively in relationships as evidenced by self report                                                                                       Goal: LTG: Recall traumatic events without becoming overwhelmed with negative emotions                                                                                       Goal: LTG: Pt reports "think about these things in the past without having such an impact on my life."                 ProgressTowards Goals: Progressing  Interventions: CBT, Supportive, and Reframing  Summary: Elizabeth Martin is a 63 y.o. female who presents with symptoms of depression and trauma. Patient identifies symptoms to include reexperiencing, uncontrollable worry, and hypervigilance. Pt was oriented times 5. Pt was cooperative and engaged. Pt denies SI/HI/AVH.   The patient reported experiencing a "meltdown" the day before, feeling overwhelmed by her health issues and expressing feelings of "helplessness." She mentioned that she feels unrecognizable due to her physical disability. Additionally, she expressed concerns about her coping habits related to shopping.   The patient shared that she did not have much growing up and started working at a young age. Now, she is using her financial freedom to support her needs, stemming from a long-held belief  that she did not have enough money throughout her life. However, she also acknowledged her disappointment in realizing that  money cannot resolve her physical disability or medical complications.  I educated her about the anger iceberg concept and reflected on the emotions driving her anger. We discussed her tendency to avoid experiencing her emotions in order to make others feel comfortable.  For homework, patient will begin a "get it out journal" where she will document her emotions in efforts to get more comfortable with processing her feelings.   Suicidal/Homicidal: Nowithout intent/plan  Therapist Response: Clinician used active and supportive reflection to create a safe environment for patient to process recent life stressors. Clinician assessed for current symptoms, stressors, safety since last session.  Educated patient on the anger iceberg and addressed patient's patterns of often avoiding her trigger feelings.  Identified problem-solving techniques patient can engage in to process her unresolved grief and anger.  Plan: Return again in 4 weeks.  Diagnosis: Recurrent major depressive disorder, in partial remission (HCC)  GAD (generalized anxiety disorder)  PTSD (post-traumatic stress disorder)   Collaboration of Care: [facility] psychiatrist can access notes and cln. Will review psychiatrists' notes. Check in with the patient and will see LCSW per availability. Patient agreed with treatment recommendations.   Patient/Guardian was advised Release of Information must be obtained prior to any record release in order to collaborate their care with an outside provider. Patient/Guardian was advised if they have not already done so to contact the registration department to sign all necessary forms in order for us  to release information regarding their care.   Consent: Patient/Guardian gives verbal consent for treatment and assignment of benefits for services provided during this visit. Patient/Guardian expressed understanding and agreed to proceed.   [name], LCSW 05/02/2024

## 2024-05-04 ENCOUNTER — Encounter: Admitting: Occupational Therapy

## 2024-05-07 ENCOUNTER — Ambulatory Visit: Admitting: Occupational Therapy

## 2024-05-07 ENCOUNTER — Ambulatory Visit: Attending: Family Medicine

## 2024-05-07 DIAGNOSIS — M6281 Muscle weakness (generalized): Secondary | ICD-10-CM | POA: Diagnosis present

## 2024-05-07 DIAGNOSIS — M25651 Stiffness of right hip, not elsewhere classified: Secondary | ICD-10-CM | POA: Insufficient documentation

## 2024-05-07 DIAGNOSIS — I890 Lymphedema, not elsewhere classified: Secondary | ICD-10-CM | POA: Insufficient documentation

## 2024-05-07 DIAGNOSIS — R262 Difficulty in walking, not elsewhere classified: Secondary | ICD-10-CM | POA: Diagnosis present

## 2024-05-07 DIAGNOSIS — M25551 Pain in right hip: Secondary | ICD-10-CM | POA: Insufficient documentation

## 2024-05-07 DIAGNOSIS — R2681 Unsteadiness on feet: Secondary | ICD-10-CM | POA: Diagnosis present

## 2024-05-07 NOTE — Therapy (Signed)
 OUTPATIENT OCCUPATIONAL THERAPY TREATMENT NOTE  LOWER EXTREMITY LYMPHEDEMA  Patient Name: Elizabeth Martin MRN: [id number] DOB:1961-05-02, 63 y.o., female Today's Date: 05/07/2024   END OF SESSION:  Lymphedema Episode 2   OT End of Session - 05/07/24 1109     Visit Number 36    Number of Visits 36    Date for OT Re-Evaluation 07/17/24    OT Start Time 1105    OT Stop Time 1210    OT Time Calculation (min) 65 min    Activity Tolerance Patient tolerated treatment well;No increased pain    Behavior During Therapy WFL for tasks assessed/performed              Past Medical History:  Diagnosis Date   Anxiety    Asthma    Autoimmune disease (HCC)    Cancer (HCC)    Endometrial lymph node removal (30)   Corneal abrasion, right 05/24/2022   Depression    GERD (gastroesophageal reflux disease)    H/O blood clots    upper rt groin and behind both knees   History of hiatal hernia    Hypertension    Lymphedema    right leg   Morphea scleroderma    PONV (postoperative nausea and vomiting)    states gets violently ill   Past Surgical History:  Procedure Laterality Date   BASAL CELL CARCINOMA EXCISION Left    CHOLECYSTECTOMY  2008   FLEXIBLE SIGMOIDOSCOPY N/A 05/19/2022   Procedure: FLEXIBLE SIGMOIDOSCOPY;  Surgeon: Toledo, Alphonsus Jeans, MD;  Location: [facility];  Service: Gastroenterology;  Laterality: N/A;   HERNIA REPAIR  2004   ILEOSTOMY CLOSURE N/A 08/24/2022   Procedure: ILEOSTOMY TAKEDOWN, open loop with Apolonio Bay, PA-C to assist;  Surgeon: Emmalene Hare, MD;  Location: [facility];  Service: General;  Laterality: N/A;   IVC FILTER INSERTION N/A 11/09/2023   Procedure: IVC FILTER INSERTION;  Surgeon: Celso College, MD;  Location: [facility];  Service: Cardiovascular;  Laterality: N/A;   IVC FILTER REMOVAL N/A 01/02/2024   Procedure: IVC FILTER REMOVAL;  Surgeon: Celso College, MD;  Location: [facility];  Service: Cardiovascular;  Laterality:  N/A;   PLANTAR FASCIECTOMY     ROBOTIC ASSISTED TOTAL HYSTERECTOMY WITH BILATERAL SALPINGO OOPHERECTOMY  02/14/2012   ROTATOR CUFF REPAIR Left 03/24/2022   TONSILLECTOMY     TOTAL HIP ARTHROPLASTY Right 11/14/2023   Procedure: TOTAL HIP ARTHROPLASTY - posterior;  Surgeon: Venus Ginsberg, MD;  Location: [facility];  Service: Orthopedics;  Laterality: Right;   XI ROBOTIC ASSISTED LOWER ANTERIOR RESECTION N/A 05/24/2022   Procedure: XI ROBOTIC ASSISTED LOWER ANTERIOR RESECTION;  Surgeon: Emmalene Hare, MD;  Location: [facility];  Service: General;  Laterality: N/A;   Patient Active Problem List   Diagnosis Date Noted   Constipation 01/10/2024   Nausea & vomiting 01/10/2024   S/P total right hip arthroplasty 11/14/2023   Blood coagulation disorder (HCC) 11/10/2023   PTSD (post-traumatic stress disorder) 11/10/2023   Severe episode of recurrent major depressive disorder, without psychotic features (HCC) 11/10/2023   GAD (generalized anxiety disorder) 11/10/2023   High risk medication use 11/10/2023   Anxiety and depression 03/22/2023   Cervical radiculopathy 03/16/2023   Colon cancer screening 03/15/2023   Chronic idiopathic constipation 03/15/2023   Dyspnea 03/07/2023   [facility] spotted fever 03/07/2023   Strain of gastrocnemius tendon 03/07/2023   Lower abdominal pain 03/07/2023   Pain in pelvis 03/07/2023   Small bowel obstruction (HCC) 02/11/2023  Raynaud disease 02/11/2023   Polyarthralgia 02/11/2023   Edema 01/03/2023   Diverticular disease 12/10/2022   Hot flashes 12/10/2022   Pain of right lower extremity 12/10/2022   Paresthesia of lower extremity 12/10/2022   Primary insomnia 12/10/2022   Thyroid  nodule 12/10/2022   Multiple joint pain 12/10/2022   Basal cell carcinoma of skin 12/10/2022   Acute bilateral low back pain without sciatica 11/10/2022   Chronic lower back pain 09/05/2022   S/P closure of ileostomy 08/24/2022   Ileostomy in place [facility]    Morphea  07/13/2022   Colonic stricture (HCC) 05/19/2022   Large bowel obstruction (HCC) 05/19/2022   Hypokalemia 05/18/2022   Abdominal pain 05/18/2022   Osteoarthritis of left knee 02/24/2022   Mixed connective tissue disease (HCC) 02/17/2022   Adhesive capsulitis of left shoulder 01/27/2022   Glenohumeral arthritis, left 01/27/2022   Microscopic hematuria 01/12/2022   Otorrhagia of right ear 12/10/2021   Elevated testosterone level in female 11/04/2021   Anti-TPO antibodies present 10/20/2021   Rash 10/13/2021   Hashimoto thyroiditis, 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   Hashimoto's disease 03/23/2021   Exposure to severe acute respiratory syndrome coronavirus 2 (SARS-CoV-2) 12/19/2020   Migraine without aura and without status migrainosus, not intractable 08/19/2020   Myofascial pain 07/30/2020   Neck pain 07/30/2020   Upper back pain 07/30/2020   Cervical facet joint syndrome 07/30/2020   Hair loss 07/14/2020   Edema of lower extremity 07/11/2020   Adenomatous colon polyp 02/26/2020   Gastroesophageal reflux disease 02/15/2020   Diarrhea 02/15/2020   Sleep disorder 01/16/2020   Hyperlipidemia 07/11/2019   Right shoulder pain 04/04/2019   Pre-operative clearance 12/13/2018   Endometrial cancer (HCC) 04/05/2018   Patellofemoral pain syndrome of right knee 03/20/2018   Gastroenteritis 12/07/2017   Fatigue 11/16/2017   Morbid obesity (HCC) 08/11/2017   Stucco keratoses 08/11/2017   Essential hypertension 08/09/2017   History of endometrial cancer 08/09/2017   Lymphedema 08/09/2017   Menopause 08/09/2017   Malignant neoplastic disease (HCC) 04/07/2017   Cellulitis 09/18/2016   Benign hypertension 06/02/2016   Asthma 06/04/1969    PCP: Eather Golder, MD  REFERRING PROVIDER: Eather Golder, MD  REFERRING DIAG: I89.0   THERAPY DIAG:  Lymphedema, not elsewhere classified  Rationale for Evaluation and Treatment: Rehabilitation  ONSET DATE: 2013  (Cancer-related, endometrial  2013)  SUBJECTIVE:                                                                                                                                                                                           SUBJECTIVE STATEMENT:Patient returns to OT for lymphedema care to RLE/RLQ.  Pt  rates LE-related pain at 3/10 today. Pt had PT before OT session today. Pt reports she has not yet heard from [employer] rep about replacing Flexi garments. Garments must be replaced before she can resume using device. OT contacted rep again via email after visit to request she call Pt w status report ASAP.   PERTINENT HISTORY: Asthma, Autoimmune Disease (Connective Tissue Disease), Endometrial Ca w/ LND ( 30 bilateral pelvic and periaortic LN), adjuvant chemotherapy and XRT,   H/O blood clots, RLE/RLQ lymphedema, Robotic assisted total hysterectomy w/ bilateral oophorectomy L Rotator Cuff repair, S/P ileostomy 2/2 colon stricture 05/2022; s/p R hip arthroplasty 11/11/23, scleroderma morphia bilateral groin  PAIN:  Are you having pain? YES. See subjective; 3/10 Pain location: RLE, R hip, cervical spine Pain description: sore, aching, heavy, full, tight,  Aggravating factors: standing, walking, extended dependent sitting Relieving factors: elevation, movement  PRECAUTIONS: Fall and Other: LYMPHEDEMA  WEIGHT BEARING RESTRICTIONS: Yes 5# lifting restriction- shoulder  FALLS:  Has patient fallen in last 6 months? No  LIVING ENVIRONMENT: Lives with: lives with their spouse Lives in: House/apartment Stairs: No;  Has following equipment at home: Single point cane, Environmental consultant - 2 wheeled, Environmental consultant - 4 wheeled, and Wheelchair (manual)  OCCUPATION: retired Control and instrumentation engineer: gardening, hiking, biking, dancing- unable to participate in any of these due to pain and swelling  HAND DOMINANCE: right   PRIOR LEVEL OF FUNCTION: Independent with basic ADLs, Independent with household mobility without device, Independent  with community mobility without device, Requires assistive device for independence, Needs assistance with homemaking, Needs assistance with transfers, and Leisure: decreased  social participation for leisure pursuits due to impaired mobility and pain  PATIENT GOALS:  Be able to move freely without pain Reduce limb volume to be able to lift limb for basic daily ADLs and functional ambulation Reduce limb volume to increase ability to perform lower body dressing and bathing and grooming Reduce limb to increase body image  OBJECTIVE: Note: Objective measures were completed at Evaluation unless otherwise noted.  COGNITION:  Overall cognitive status: Within functional limits for tasks assessed   OBSERVATIONS / OTHER ASSESSMENTS:   POSTURE: WFL  LE ROM: WFL, but Limited mildly at R knee and ankle due to girth, skin approximation, and joint pain  LE MMT: WFL  Mild, Stage  II, Bilateral Lower Extremity Lymphedema 2/2 CVI and Obesity  Skin  Description Hyper-Keratosis Peau d' Orange Shiny Tight Fibrotic/ Indurated Fatty Doughy Spongy/ boggy   x x x x R>L Severe morphea at  R groin obstructing lymphatics   x   Skin dry Flaky WNL Macerated   mildly sclerotic     Color Redness Varicosities Blanching Hemosiderin Stain Mottled   x x x   x   Odor Malodorous Yeast Fungal infection  WNL      x   Temperature Warm Cool wnl    x     Pitting Edema   1+ 2+ 3+ 4+ Non-pitting         x   Girth Symmetrical Asymmetrical                   Distribution    R>L RLE toes to groin    Stemmer Sign Positive Negative   +    Lymphorrhea History Of:  Present Absent     x    Wounds History Of Present Absent Venous Arterial Pressure Sheer     x        Signs of Infection  Redness Warmth Erythema Acute Swelling Drainage Borders                    Sensation Light Touch Deep pressure Hypersensitivity   In tact Impaired In tact Impaired Absent Impaired   x  x  x     Nails WNL   Fungus  nail dystrophy   x     Hair Growth Symmetrical Asymmetrical    R>L   Skin Creases Base of toes  Ankles   Base of Fingers knees       Abdominal pannus Thigh Lobules  Face/neck   x x  x        BLE COMPARATIVE LIMB VOLUMETRICS 10/10/23  LANDMARK RIGHT  10/10/23  R LEG (A-D) 5466.5 ml  R THIGH (E-G) 9186.6 ml  R FULL LIMB (A-G) 14653.1 ml  Limb Volume differential (LVD)  LVD for LEG = 44.1%, R>L LVD for THIGH= 33.98%, R>L LVD for FULL lower extremity 37.8%, R>L  Volume change since last 08/11/22 R LEG is INCREASED in volume by 59.6%. L THIGH is INCREASED by 38 %, and RLE full limb is INCREASED by 45.38%.  Volume change overall V  (Blank rows = not tested)  LANDMARK LEFT  10/12/23  L LEG (A-D) 3053.6 ml  L THIGH (E-G) 6064.8 ml  L  FULL LIMB (A-G) 9118.4 ml  Limb Volume differential (LVD)  %  Volume change since initial %  Volume change overall %  (Blank rows = not tested)   10 th VISIT PROGRESS NOTE RLE COMPARATIVE LIMB VOLUMETRICS 01/04/24: Deferred until next visit by Pt request.  LANDMARK RIGHT    R LEG (A-D) TBA  R THIGH (E-G) TBA  R FULL LIMB (A-G) TBA  Limb Volume differential (LVD)    Volume change since last 08/11/22 TBA  Volume change overall TBA  (Blank rows = not tested)  20 th VISIT PROGRESS NOTE RLE COMPARATIVE LIMB VOLUMETRICS TBA next session  LANDMARK RIGHT    R LEG (A-D) 6005.7 ml  R THIGH (E-G) 9400.3 ml  R FULL LIMB (A-G) 15406.02 ml  Limb Volume differential (LVD)    Volume change since last measured on 10/10/23 R LEG is INCREASED in volume by 9.9%. L THIGH is INCREASED by 2.32 %, and RLE full limb is INCREASED by 5.13% since last measured on 10/10/23.  Volume change since commencing OT for CDT %    28 th VISIT PROGRESS NOTE RLE COMPARATIVE LIMB VOLUMETRICS TBA next session  LANDMARK RIGHT    R LEG (A-D) 5936.1 ml  R THIGH (E-G) 8538.6  ml  R FULL LIMB (A-G) 14524.7  ml  Limb Volume differential (LVD)    Volume change since last measured on  10/10/23 R LEG is DECREASED in volume by 1.2 %.  Since last measured on 03/14/24. R THIGH is DECREASED by 8.6 %, and RLE full limb is DECREASED by 5.72 % since last measured on 03/14/24.  Volume change since commencing OT for CDT %   ENDOMETRIAL CA-related LYMPHEDEMA Hx:  SURGERY TYPE/DATE: 2013 NUMBER OF LYMPH NODES REMOVED: 30 by report- bilateral pelvic and periaortic CHEMOTHERAPY: yes RADIATION:yes INFECTIONS: no hx cellulitis  LYMPHEDEMA LIFE IMPACT SCALE (LLIS): Intake 10/12/23 75% (The extent to which lymphedema related problems impacted your life over the past week)  FOTO (functional outcome measure):10/10/23 INTAKE: 36% No Out take score. FOTO assessment discontinued by clinic.  PATIENT EDUCATION:  Education details: Continued Pt/ CG edu for lymphedema self care home program throughout session.  Topics include outcome of comparative limb volumetrics- starting limb volume differentials (LVDs), technology and gradient techniques used for short stretch, multilayer compression wrapping, simple self-MLD, therapeutic lymphatic pumping exercises, skin/nail care, LE precautions,. compression garment recommendations and specifications, wear and care schedule and compression garment donning / doffing w assistive devices. Discussed progress towards all OT goals since commencing CDT. All questions answered to the Pt's satisfaction. Good return. Person educated: Patient Education method: Explanation, Demonstration, and Handouts Education comprehension: verbalized understanding, returned demonstration, and needs further education  LE SELF-CARE HOME  PROGRAM: Simple self-MLD/daily to affected quadrant and body part At least 2 x daily BLE Lymphatic Pumping There ex 1 set of 10 reps each, in order, bilaterally Daily skin care to affected body part to limit infection risk and increase skin excursion Compression Bandaging Intensive stage compression: multilayer short stretch wraps with gradient techniques.  One limb at a time. Length patient dependent. Self-management Phase: Appropriate daytime compression garment and hours-of-sleep device  Compression garments: Custom-made gradient compression garments and hours-of-sleep devices are medically necessary because they are uniquely sized and shaped to fit the exact dimensions of the affected extremities and to provide accurate and consistent gradient compression and containment, essential  for optimally managing chronic, progressive lymphedema. The convoluted HOS devices are medically necessary to facilitate increased lymphatic circulation and limit fibrosis formation when sleeping. Multiple custom compression garments are needed for optimal hygiene to limit infection risk. Custom compression garments should be replaced q 3-6 months When worn consistently for optimal lipo-lymphedema self-management over time.  ASSESSMENT:  CLINICAL IMPRESSION:  At present RLE/RLQ is intractable to CDT.  Limb density and swelling are minimally reduced and softened since commencing CDT after hip replacement . 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 Lymphedema- related skin changes include below the knee extending to the ankle,  hyperkeratosis on the R foot and small, lymphatic cysts on the leg, and finally a thickened rough patch at base of toes  Continued RLE/RLQ MLD in side lying and supine as established working proximally to distal from neck, deep abdominal pathways, functional axillary LN, and J strokes  from thigh to toes. Pt tolerated MLD and deeper fibrosis techniques without increased pain. After MLD we Applied LLE multilayer compression wraps as established. We added closed cell foam kidneys to both medial and lateral malleoli. Continue to support Pt in her efforts to obtain medically necessary custom compression garments and devices.  Cont 3 x weekly as  per POC.  10/10/23 Lymphedema Episode 2, Initial OT Evaluation : Achol Azpeitia is a 61 y a female presenting with chronic, progressive, moderate, Stage II, RLE/RLQ cancer -related lymphedema with onset 11 years ago after Rx for endometrial cancer. Pt is well known to this therapist as she has successfully undergone Complete Decongestive Therapy (CDT)  this clinic bin the past. Pt reports recent exacerbation of RLE swelling worsened as inflammation in L hip has worsened over time.  Pt has a hip replacement scheduled in late December here it [facility]. RE limb volumetrics today reveal that R LEG volume is dramatically increased by 59.65 since last visit. R LEG VOLUME is INCREASED in volume by 59.6%. L THIGH is INCREASED by 38 %, and RLE full limb is INCREASED by 45.38%. Pt presents with LE-related skin changes. Prolonged inflammation in the R hip joint has likely overloaded  already RLE/RLQ lymphatics.  RLE/RLQ lymphedema limits Pt's ability to perform basic and instrumental  ADLs, including functional ambulation, mobility and transfers, grooming , lower body bathing and dressing, skin inspection and skin care. Pt has difficulty  reaching her lower legs and feet to apply compression wraps and don/doff        compression stockings. LE limits ability to P[perform instrumental ADLs, including driving, yard work and home management activities. Lymphedema limits her ability to participate in leisure pursuits and productive activities, and it negatively impacts body image, life roles and quality of life.   Pt will benefit from an Intensive and follow along course of lymphedema. CDT will consist of Manual Lymphatic gainage   (MLD), skin care, therapeutic exercise and compression, wraps initially then garments. Pt will need assistance throughout CDT   for applying compression wraps due to limited hip AROM and decreased skin flexibility. Without skilled Occupational Therapy for lymphedema care, lymphedema will progress and  further functional decline is expected.   In prep for upcoming hip replacement OT will educate Pt re assistive devices, including tub transfer bench and elevated toilet seat, in keeping with hip precautions.  OBJECTIVE IMPAIRMENTS: Abnormal gait, decreased activity tolerance, decreased balance, decreased knowledge of use of DME, decreased mobility, difficulty walking, decreased ROM, decreased strength, increased edema, decreased skin flexibility, increased fascial restrictions, impaired sensation, pain, and chronic, progressive LLE/LLQ lymphatic swelling and associated pain.   ADL LIMITATIONS: carrying, lifting, bending, sitting, standing, squatting, sleeping, stairs, transfers, bed mobility, bathing, dressing, hygiene/grooming, and productive activities, leisure pursuits, social participation, body image driving, shopping, housework, yard work, cooking, meal prep  PERSONAL FACTORS: Past/current experiences and 3+ comorbidities: Morphea Scleroderma, Mixed connective tissue disease, and osteoarthritis are also affecting patient's functional outcome.   REHAB POTENTIAL: Good  EVALUATION COMPLEXITY: Moderate  GOALS: Goals reviewed with patient? Yes  SHORT TERM GOALS: Target date: 4th OT Rx visit   Pt will demonstrate understanding of lymphedema precautions and prevention strategies with modified independence using a printed reference to identify at least 5 precautions and discussing how s/he may implement them into daily life to reduce risk of progression with extra time.  Baseline:Max A Goal status: GOAL MET  2.  Pt will be able to apply multilayer, knee length, gradient, compression wraps to one leg at a time with modified assistance (extra time and assistive device/s) to decrease limb volume, to limit infection risk, and to limit lymphedema progression.  Baseline: Dependent Goal status: GOAL MET  LONG TERM GOALS: Target date: 01/09/24 (12 weeks)  Given this patient's Intake score 36% on the  functional outcomes FOTO tool, patient will experience an increase in function of 3 points to improve basic and instrumental ADLs performance, including lymphedema self-care.  Baseline: Max A Goal status: DEFERRED as [employer] has been discontinued  2.  Given this patient's Intake score of  75% on the Lymphedema Life Impact Scale (LLIS), patient will experience a reduction of at least 5 points in her perceived level of functional impairment resulting from lymphedema to improve functional performance and quality of life (QOL). Baseline: % Goal status: PROGRESSING  3.  Pt will achieve at least a 10% volume reduction in full, RLE limb volume to return limb to typical size and shape, to limit infection risk and LE progression, to decrease pain, to improve function. Baseline: Dependent Goal status:PROGRESSING. See chart   4.  Pt will obtain proper compression garments/devices and achieve modified independence (extra time + assistive devices) with donning/doffing to optimize limb volume reductions and limit LE  progression over time. Baseline:  Goal status: 01/04/24: PARTIALLY MET;  In an effort to reduce burden of care on her spouse, Pt obtained adjustable, Velcro style R full leg, Circaid leg reduction kit for use s/p THA, but this proved not effective for controlling swelling, and were not easier to don and doff more independently. We've resumed wrapping until she is able to fit back into custom compression garments. 02/14/24: Ongoing 04/11/24: Submitted new garment measurements to DME vendor for 3 piece system to address worsening lymphedema. Pt's insurance company denied coverage. She in the process of appealing.  5.  During Intensive phase CDT , with modified independence, Pt will achieve at least 85% compliance with all lymphedema self-care home program components, including daily skin care, compression wraps and /or garments, simple self MLD and lymphatic pumping therex to habituate LE self care protocol   into ADLs for optimal LE self-management over time. Baseline: Dependent Goal status: 02/14/24: GOAL MET and EXCEEDED PLAN:  OT FREQUENCY: 2-3 x/week  OT DURATION: 12 weeks and PRN  PLANNED INTERVENTIONS: Complete Decongestive Therapy (Intensive and supported Self-Management Phases), 97110-Therapeutic exercises, 97530- Therapeutic activity, 97535- Self Care, [id number]- Manual therapy, Patient/Family education, Taping, Manual lymph drainage, Scar mobilization, Compression bandaging, DME instructions, and skin care to reduce infection risk throughout manual therapy. Fit with replacement compression garments ASAP  PLAN FOR NEXT SESSION:  Cont MLD as established Cont multilayer compression wrapping   Arnold Bicker, MS, OTR/L, CLT-LANA 05/07/24 1:17 PM

## 2024-05-07 NOTE — Therapy (Signed)
 OUTPATIENT PHYSICAL THERAPY TREATMENT   Patient Name: Elizabeth Martin MRN: [id number] DOB:04-08-1961, 63 y.o., female Today's Date: 05/08/2024  END OF SESSION:  PT End of Session - 05/07/24 1022     Visit Number 25    Number of Visits 35    Date for PT Re-Evaluation 06/04/24    Authorization Type [employer]    Progress Note Due on Visit 30    PT Start Time 1016    PT Stop Time 1058    PT Time Calculation (min) 42 min    Equipment Utilized During Treatment Gait belt    Activity Tolerance Patient tolerated treatment well    Behavior During Therapy WFL for tasks assessed/performed                       Past Medical History:  Diagnosis Date   Anxiety    Asthma    Autoimmune disease (HCC)    Cancer (HCC)    Endometrial lymph node removal (30)   Corneal abrasion, right 05/24/2022   Depression    GERD (gastroesophageal reflux disease)    H/O blood clots    upper rt groin and behind both knees   History of hiatal hernia    Hypertension    Lymphedema    right leg   Morphea scleroderma    PONV (postoperative nausea and vomiting)    states gets violently ill   Past Surgical History:  Procedure Laterality Date   BASAL CELL CARCINOMA EXCISION Left    CHOLECYSTECTOMY  2008   FLEXIBLE SIGMOIDOSCOPY N/A 05/19/2022   Procedure: FLEXIBLE SIGMOIDOSCOPY;  Surgeon: Toledo, Alphonsus Jeans, MD;  Location: [facility];  Service: Gastroenterology;  Laterality: N/A;   HERNIA REPAIR  2004   ILEOSTOMY CLOSURE N/A 08/24/2022   Procedure: ILEOSTOMY TAKEDOWN, open loop with Apolonio Bay, PA-C to assist;  Surgeon: Emmalene Hare, MD;  Location: [facility] OR[facility];  Service: General;  Laterality: N/A;   IVC FILTER INSERTION N/A 11/09/2023   Procedure: IVC FILTER INSERTION;  Surgeon: Celso College, MD;  Location: [facility];  Service: Cardiovascular;  Laterality: N/A;   IVC FILTER REMOVAL N/A 01/02/2024   Procedure: IVC FILTER REMOVAL;  Surgeon: Celso College, MD;  Location: [facility]  INVASIVE CV [facility];  Service: Cardiovascular;  Laterality: N/A;   PLANTAR FASCIECTOMY     ROBOTIC ASSISTED TOTAL HYSTERECTOMY WITH BILATERAL SALPINGO OOPHERECTOMY  02/14/2012   ROTATOR CUFF REPAIR Left 03/24/2022   TONSILLECTOMY     TOTAL HIP ARTHROPLASTY Right 11/14/2023   Procedure: TOTAL HIP ARTHROPLASTY - posterior;  Surgeon: Venus Ginsberg, MD;  Location: [facility] OR[facility];  Service: Orthopedics;  Laterality: Right;   XI ROBOTIC ASSISTED LOWER ANTERIOR RESECTION N/A 05/24/2022   Procedure: XI ROBOTIC ASSISTED LOWER ANTERIOR RESECTION;  Surgeon: Emmalene Hare, MD;  Location: [facility] OR[facility];  Service: General;  Laterality: N/A;   Patient Active Problem List   Diagnosis Date Noted   Constipation 01/10/2024   Nausea & vomiting 01/10/2024   S/P total right hip arthroplasty 11/14/2023   Blood coagulation disorder (HCC) 11/10/2023   PTSD (post-traumatic stress disorder) 11/10/2023   Severe episode of recurrent major depressive disorder, without psychotic features (HCC) 11/10/2023   GAD (generalized anxiety disorder) 11/10/2023   High risk medication use 11/10/2023   Anxiety and depression 03/22/2023   Cervical radiculopathy 03/16/2023   Colon cancer screening 03/15/2023   Chronic idiopathic constipation 03/15/2023   Dyspnea 03/07/2023   [place] spotted fever 03/07/2023   Strain of gastrocnemius  tendon 03/07/2023   Lower abdominal pain 03/07/2023   Pain in pelvis 03/07/2023   Small bowel obstruction (HCC) 02/11/2023   Raynaud disease 02/11/2023   Polyarthralgia 02/11/2023   Edema 01/03/2023   Diverticular disease 12/10/2022   Hot flashes 12/10/2022   Pain of right lower extremity 12/10/2022   Paresthesia of lower extremity 12/10/2022   Primary insomnia 12/10/2022   Thyroid  nodule 12/10/2022   Multiple joint pain 12/10/2022   Basal cell carcinoma of skin 12/10/2022   Acute bilateral low back pain without sciatica 11/10/2022   Chronic lower back pain 09/05/2022   S/P closure of ileostomy  08/24/2022   Ileostomy in place [facility])    Morphea 07/13/2022   Colonic stricture (HCC) 05/19/2022   Large bowel obstruction (HCC) 05/19/2022   Hypokalemia 05/18/2022   Abdominal pain 05/18/2022   Osteoarthritis of left knee 02/24/2022   Mixed connective tissue disease (HCC) 02/17/2022   Adhesive capsulitis of left shoulder 01/27/2022   Glenohumeral arthritis, left 01/27/2022   Microscopic hematuria 01/12/2022   Otorrhagia of right ear 12/10/2021   Elevated testosterone level in female 11/04/2021   Anti-TPO antibodies present 10/20/2021   Rash 10/13/2021   Hashimoto thyroiditis, 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   Hashimoto's disease 03/23/2021   Exposure to severe acute respiratory syndrome coronavirus 2 (SARS-CoV-2) 12/19/2020   Migraine without aura and without status migrainosus, not intractable 08/19/2020   Myofascial pain 07/30/2020   Neck pain 07/30/2020   Upper back pain 07/30/2020   Cervical facet joint syndrome 07/30/2020   Hair loss 07/14/2020   Edema of lower extremity 07/11/2020   Adenomatous colon polyp 02/26/2020   Gastroesophageal reflux disease 02/15/2020   Diarrhea 02/15/2020   Sleep disorder 01/16/2020   Hyperlipidemia 07/11/2019   Right shoulder pain 04/04/2019   Pre-operative clearance 12/13/2018   Endometrial cancer (HCC) 04/05/2018   Patellofemoral pain syndrome of right knee 03/20/2018   Gastroenteritis 12/07/2017   Fatigue 11/16/2017   Morbid obesity (HCC) 08/11/2017   Stucco keratoses 08/11/2017   Essential hypertension 08/09/2017   History of endometrial cancer 08/09/2017   Lymphedema 08/09/2017   Menopause 08/09/2017   Malignant neoplastic disease (HCC) 04/07/2017   Cellulitis 09/18/2016   Benign hypertension 06/02/2016   Asthma 06/04/1969    PCP: Eather Golder MD   REFERRING PROVIDER: Eather Golder MD   REFERRING DIAG: s/p R hip replacement  THERAPY DIAG:  Muscle weakness (generalized)  Stiffness of right hip, not elsewhere  classified  Pain in right hip  Difficulty in walking, not elsewhere classified  Unsteadiness on feet  Rationale for Evaluation and Treatment: Rehabilitation  ONSET DATE: 11/14/23  SUBJECTIVE:   SUBJECTIVE STATEMENT:     Patient reports feeling okay- had one day out of the weekend where she felt rough but better overall. Reports 3/10 R LE soreness today.      PERTINENT HISTORY: s/p R hip arthropaslasty 11/14/23 with Dr. Clyda Dark posterior approach, blood coagulation disorder, PTSD, anxiety, cervical radiculopathy dypsnea, polyarthralgia, Raynaud disease, s/p closure of ileostomy, Hashimoto thyroid , endometrial cancer, menopause, endometrial cancer, lymphedema. Patient had home health PT prior to PT here.   PAIN:  Are you having pain?  2/10 Rt posterior deep hip and medial posterior gluteals   PRECAUTIONS: Other: recovering from hip sx  RED FLAGS: None   WEIGHT BEARING RESTRICTIONS: No  FALLS:  Has patient fallen in last 6 months? Yes. Number of falls 2  LIVING ENVIRONMENT: Lives with: lives with their spouse Lives in: House/apartment Stairs: No Has following equipment at home: Single  point cane  OCCUPATION: retired,   PLOF: Independent  PATIENT GOALS: return to hiking, working out, gardening.   NEXT MD VISIT: March 2025   OBJECTIVE:  Note: Objective measures were completed at Evaluation unless otherwise noted.  DIAGNOSTIC FINDINGS:  IMPRESSION: "Right hip replacement.  No visible complicating feature."  PATIENT SURVEYS:  LEFS 5/80  COGNITION: Overall cognitive status: Within functional limits for tasks assessed    MUSCLE LENGTH: Hamstrings: limited bilaterally with R>L   LOWER EXTREMITY ROM:  Passive ROM Right  04/02/24 Right eval Left eval  Hip flexion >90 degrees 48 flexion PROM supine   Hip extension 0 degrees    Hip abduction ~25 degrees 5 degrees AROM     (Blank rows = not tested)  LOWER EXTREMITY MMT:  MMT Right 04/02/24 Right eval  Left eval  Hip flexion 4/5 2+ 5  Hip extension (supine 30 deg) 2-/5   5  Hip Hor ADD 5/5    Hip abduction 2+/5 2+*  5  Hip Hor ABDCT 4+/5    Hip adduction  2+ 5  Hip internal rotation 4+/5    Hip external rotation 3-/5     Knee flexion 5/5 3 5   Knee extension 4+/5 3 5   Ankle dorsiflexion 5/5 3 5   Ankle plantarflexion 5/5 3 5    (Blank rows = not tested)   FUNCTIONAL TESTS:  5 times sit to stand: 29 6 minute walk test:  10 meter walk test: 15 seconds with SPC   GAIT: Distance walked: 60 ft Assistive device utilized: Single point cane Level of assistance: CGA Comments: antalgic gait pattern with decreased stance time RLE. Slight vaulting pattern.  Stair negotiation: -step to pattern lead with LLE, one hand on cane one hand on rail ascending. Descending step to pattern down with RLE.                                                                                                                               TREATMENT DATE:  05/08/24  Therex:  Supine SLR x 12 reps Supine Hip mobiliization- ER/IR x 15 with a few second hold Bridging (gentle) - 2 -3 sec hold x 15 rep  Prone Hip ext - 2 sec hold x 10 (difficulty with RLE yet able to complete)  Prone knee flex- active x 15 reps each LE Seated ham curl with matrix cable system 7.5# x 10 reps each LE   TA: Sit to stand with airex pad under left LE- 10 reps at 24 in high mat and 10 reps at 21 in mat.          PATIENT EDUCATION:  Education details: exercise technique Person educated: Patient Education method: Explanation, Demonstration, Tactile cues, and Verbal cues Education comprehension: verbalized understanding, returned demonstration, verbal cues required, tactile cues required, and needs further education  HOME EXERCISE PROGRAM:  Access Code: [facility] URL: [email or web address] Date: 04/11/2024 Prepared by: Ferrell Hu  Exercises - Side Stepping with Counter  Support  - 1 x daily - 7 x  weekly - 2 sets - 10 reps - 5 hold - Standing Hip Extension  - 1 x daily - 7 x weekly - 2 sets - 10 reps - 5 hold - Standing March with Counter Support  - 1 x daily - 7 x weekly - 2 sets - 10 reps - 5 hold - Standing Hip Abduction with Counter Support  - 1 x daily - 7 x weekly - 2 sets - 10 reps - Seated Hamstring Stretch  - 1 x daily - 7 x weekly - 2 sets - 2 reps - 30 hold - Sit to Stand  - 1 x daily - 7 x weekly - 2 sets - 10 reps - 5 hold - Supine Heel Slide  - 1 x daily - 7 x weekly - 2 sets - 6 reps - Supine Gluteal Sets  - 1 x daily - 7 x weekly - 2 sets - 10 reps - Supine Bridge  - 1 x daily - 7 x weekly - 3 sets - 5 reps - 2-3sec hold - Supine Short Arc Quad  - 1 x daily - 7 x weekly - 3 sets - 15 reps - Small Range Straight Leg Raise  - 1 x daily - 7 x weekly - 3 sets - 5 reps - Hooklying Clamshell with Resistance  - 1 x daily - 7 x weekly - 3 sets - 15 reps - Prone Hip Extension with Bent Knee  - 1 x daily - 7 x weekly - 3 sets - 10 reps - Prone Hip Extension Sequence  - 1 x daily - 7 x weekly - 3 sets - 10 reps - Supine Hamstring Stretch with Strap  - 1-2 x daily - 3 sets - 30sec hold - Modified Thomas Stretch  - 1-2 x daily - 3 sets - 30 sec hold - Hip Flexor Stretch on Step  - 1-2 x daily - 3 sets - 30 sec hold - Standing Hamstring Stretch with Step  - 1 x daily - 7 x weekly - 3 sets - 30 sec hold - Supine Figure 4 Piriformis Stretch  - 1-2 x daily - 3 sets - 30 hold - Supine Piriformis Stretch  - 1-2 x daily - 3 sets - 30 sec hold        Access Code: [facility] URL: [email or web address] Date: 02/08/2024; updated 03/21/2024 Prepared by: [employer]  Exercises - Side Stepping with Counter Support  - 1 x daily - 7 x weekly - 2 sets - 10 reps - 5 hold - Standing Hip Extension  - 1 x daily - 7 x weekly - 2 sets - 10 reps - 5 hold - Standing March with Counter Support  - 1 x daily - 7 x weekly - 2 sets - 10 reps - 5 hold - Standing Hip  Abduction with Counter Support  - 1 x daily - 7 x weekly - 2 sets - 10 reps - Seated Hamstring Stretch  - 1 x daily - 7 x weekly - 2 sets - 2 reps - 30 hold - Sit to Stand  - 1 x daily - 7 x weekly - 2 sets - 10 reps - 5 hold - Supine Heel Slide  - 1 x daily - 7 x weekly - 2 sets - 6 reps - Supine Gluteal Sets  - 1 x daily - 7 x weekly - 2 sets - 10 reps -  Supine Bridge  - 1 x daily - 7 x weekly - 3 sets - 5 reps - 2-3sec hold - Supine Short Arc Quad  - 1 x daily - 7 x weekly - 3 sets - 15 reps - Small Range Straight Leg Raise  - 1 x daily - 7 x weekly - 3 sets - 5 reps - Hooklying Clamshell with Resistance  - 1 x daily - 7 x weekly - 3 sets - 15 reps - Prone Hip Extension with Bent Knee  - 1 x daily - 7 x weekly - 3 sets - 10 reps - Prone Hip Extension Sequence  - 1 x daily - 7 x weekly - 3 sets - 10 reps  ASSESSMENT: CLINICAL IMPRESSION:    Patient continues to respond well to all activities. She performed well with focus on LE strengthening in various positions. The body weight of RLE is taxing on her during exercises at time but she is completing all tasks and performing well with minimal cueing. She also responded well to cable resistance with hamstring curl and may try 3 or 4-way SLR with cable resistance next visit.  Pt will continue to benefit from skilled therapy to address remaining deficits in order to improve overall QoL and return to PLOF.      OBJECTIVE IMPAIRMENTS: Abnormal gait, decreased activity tolerance, decreased balance, decreased coordination, decreased endurance, decreased mobility, difficulty walking, decreased ROM, decreased strength, hypomobility, increased edema, impaired perceived functional ability, impaired flexibility, improper body mechanics, postural dysfunction, and pain.   ACTIVITY LIMITATIONS: carrying, lifting, bending, sitting, standing, squatting, sleeping, stairs, transfers, bed mobility, bathing, toileting, dressing, reach over head, hygiene/grooming,  locomotion level, and caring for others  PARTICIPATION LIMITATIONS: meal prep, cleaning, laundry, interpersonal relationship, driving, shopping, community activity, and yard work  PERSONAL FACTORS: Age, Past/current experiences, Time since onset of injury/illness/exacerbation, and 3+ comorbidities: blood coagulation disorder, PTSD, anxiety, cervical radiculopathy dypsnea, polyarthralgia, Raynaud disease, s/p closure of ileostomy, Hashimoto thyroid , endometrial cancer, menopause, endometrial cancer, lymphedema are also affecting patient's functional outcome.   REHAB POTENTIAL: Good  CLINICAL DECISION MAKING: Evolving/moderate complexity  EVALUATION COMPLEXITY: Moderate   GOALS: Goals reviewed with patient? Yes  SHORT TERM GOALS: Target date: 01/19/2024  Patient will be independent in home exercise program to improve strength/mobility for better functional independence with ADLs. Baseline:1/30: HEP given 3/12: pt reports completing 3-4 days a week depending on soreness.  Goal status:  MET  LONG TERM GOALS: Target date: 06/04/2024  Patient (> 41 years old) will complete five times sit to stand test in < 10 seconds indicating an increased LE strength and improved balance. Baseline: 1/30: 29 seconds 3/12: 19.41 03/12/2024= 17.44 seconds- without UE support; 04/16/2024- 17.1 sec without UE support.  Goal status: PROGRESSING  2.  Patient will increase six minute walk test distance to >1000 for progression to community ambulator and improve gait ability Baseline: 430 ft in 5 min and required seated rest to end test due to fatigue, uses SPC 861ft with SPC and no standing or seated rest break; 03/12/2024= 950 feet with SPC; 04/16/2024= 870 without an AD Goal status: in progress   3.  Patient will increase 10 meter walk test to >1.6m/s as to improve gait speed for better community ambulation and to reduce fall risk. Baseline: 1/30: 15 seconds with SPC  Without SPC: 10.63 and 10.38 speed: 0.95 m/s;  with SPC 10.98 and 11.18; 03/12/2024= 1.0 m/s; 04/16/2024= 0.93 m/s without SPC Goal status: PROGRESSING  4.  Patient will increase  lower extremity functional scale to >60/80 to demonstrate improved functional mobility and increased tolerance with ADLs.  Baseline: 1/30: 5/80 3/12: 20/80; 03/12/2024= 29/80; 04/16/2024=13/80 Goal status: Progressing  PLAN:  PT FREQUENCY: 1-2x/week PT DURATION: 12 weeks PLANNED INTERVENTIONS: 97164- PT Re-evaluation, 97110-Therapeutic exercises, 97530- Therapeutic activity, 97112- Neuromuscular re-education, 97535- Self Care, 40981- Manual therapy, 97116- Gait training, [phone]- Splinting, 97014- Electrical stimulation (unattended), [phone]- Electrical stimulation (manual), 97035- Ultrasound, 21308- Traction (mechanical), Patient/Family education, Balance training, Stair training, Taping, Joint mobilization, Joint manipulation, Spinal manipulation, Spinal mobilization, Manual lymph drainage, Scar mobilization, Compression bandaging, Vestibular training, Visual/preceptual remediation/compensation, DME instructions, Cryotherapy, and Moist heat  PLAN FOR NEXT SESSION:   Continue with gentle hip stretching Progress with RLE strengthening - use cable system for Hip Continue with progressive Dynamic balance- tandem, narrowed BOS, SLS   7:19 AM, 05/08/24 Ossie Blend, PT Physical Therapist - [facility]     05/08/24, 7:19 AM

## 2024-05-09 ENCOUNTER — Ambulatory Visit

## 2024-05-09 ENCOUNTER — Ambulatory Visit: Admitting: Occupational Therapy

## 2024-05-14 ENCOUNTER — Ambulatory Visit: Admitting: Occupational Therapy

## 2024-05-14 ENCOUNTER — Ambulatory Visit

## 2024-05-14 DIAGNOSIS — R262 Difficulty in walking, not elsewhere classified: Secondary | ICD-10-CM

## 2024-05-14 DIAGNOSIS — M25551 Pain in right hip: Secondary | ICD-10-CM

## 2024-05-14 DIAGNOSIS — M25651 Stiffness of right hip, not elsewhere classified: Secondary | ICD-10-CM

## 2024-05-14 DIAGNOSIS — M6281 Muscle weakness (generalized): Secondary | ICD-10-CM | POA: Diagnosis not present

## 2024-05-14 DIAGNOSIS — I890 Lymphedema, not elsewhere classified: Secondary | ICD-10-CM

## 2024-05-14 DIAGNOSIS — R2681 Unsteadiness on feet: Secondary | ICD-10-CM

## 2024-05-14 NOTE — Therapy (Signed)
 OUTPATIENT PHYSICAL THERAPY TREATMENT   Patient Name: Elizabeth Martin MRN: [id number] DOB:Jul 13, 1961, 63 y.o., female Today's Date: 05/14/2024  END OF SESSION:  PT End of Session - 05/14/24 1012     Visit Number 26    Number of Visits 35    Date for PT Re-Evaluation 06/04/24    Authorization Type [employer]IC[employer]    Progress Note Due on Visit 30    Equipment Utilized During Treatment Gait belt    Activity Tolerance Patient tolerated treatment well    Behavior During Therapy [facility] for tasks assessed/performed                        Past Medical History:  Diagnosis Date   Anxiety    Asthma    Autoimmune disease (HCC)    Cancer (HCC)    Endometrial lymph node removal (30)   Corneal abrasion, right 05/24/2022   Depression    GERD (gastroesophageal reflux disease)    H/O blood clots    upper rt groin and behind both knees   History of hiatal hernia    Hypertension    Lymphedema    right leg   Morphea scleroderma    PONV (postoperative nausea and vomiting)    states gets violently ill   Past Surgical History:  Procedure Laterality Date   BASAL CELL CARCINOMA EXCISION Left    CHOLECYSTECTOMY  2008   FLEXIBLE SIGMOIDOSCOPY N/A 05/19/2022   Procedure: FLEXIBLE SIGMOIDOSCOPY;  Surgeon: Toledo, Alphonsus Jeans, MD;  Location: [facility] ENDOSCOPY;  Service: Gastroenterology;  Laterality: N/A;   HERNIA REPAIR  2004   ILEOSTOMY CLOSURE N/A 08/24/2022   Procedure: ILEOSTOMY TAKEDOWN, open loop with Apolonio Bay, PA-C to assist;  Surgeon: Emmalene Hare, MD;  Location: [facility] ORS;  Service: General;  Laterality: N/A;   IVC FILTER INSERTION N/A 11/09/2023   Procedure: IVC FILTER INSERTION;  Surgeon: Celso College, MD;  Location: [facility] INVASIVE CV LAB;  Service: Cardiovascular;  Laterality: N/A;   IVC FILTER REMOVAL N/A 01/02/2024   Procedure: IVC FILTER REMOVAL;  Surgeon: Celso College, MD;  Location: [facility] INV[facility] CV LAB;  Service: Cardiovascular;  Laterality: N/A;   PLANTAR FASCIECTOMY      ROBOTIC ASSISTED TOTAL HYSTERECTOMY WITH BILATERAL SALPINGO OOPHERECTOMY  02/14/2012   ROTATOR CUFF REPAIR Left 03/24/2022   TONSILLECTOMY     TOTAL HIP ARTHROPLASTY Right 11/14/2023   Procedure: TOTAL HIP ARTHROPLASTY - posterior;  Surgeon: Venus Ginsberg, MD;  Location: [facility] ORS;  Service: Orthopedics;  Laterality: Right;   XI ROBOTIC ASSISTED LOWER ANTERIOR RESECTION N/A 05/24/2022   Procedure: XI ROBOTIC ASSISTED LOWER ANTERIOR RESECTION;  Surgeon: Emmalene Hare, MD;  Location: [facility] ORS;  Service: General;  Laterality: N/A;   Patient Active Problem List   Diagnosis Date Noted   Constipation 01/10/2024   Nausea & vomiting 01/10/2024   S/P total right hip arthroplasty 11/14/2023   Blood coagulation disorder (HCC) 11/10/2023   PTSD (post-traumatic stress disorder) 11/10/2023   Severe episode of recurrent major depressive disorder, without psychotic features (HCC) 11/10/2023   GAD (generalized anxiety disorder) 11/10/2023   High risk medication use 11/10/2023   Anxiety and depression 03/22/2023   Cervical radiculopathy 03/16/2023   Colon cancer screening 03/15/2023   Chronic idiopathic constipation 03/15/2023   Dyspnea 03/07/2023   [facility] spotted fever 03/07/2023   Strain of gastrocnemius tendon 03/07/2023   Lower abdominal pain 03/07/2023   Pain in pelvis 03/07/2023   Small bowel obstruction (HCC) 02/11/2023  Raynaud disease 02/11/2023   Polyarthralgia 02/11/2023   Edema 01/03/2023   Diverticular disease 12/10/2022   Hot flashes 12/10/2022   Pain of right lower extremity 12/10/2022   Paresthesia of lower extremity 12/10/2022   Primary insomnia 12/10/2022   Thyroid  nodule 12/10/2022   Multiple joint pain 12/10/2022   Basal cell carcinoma of skin 12/10/2022   Acute bilateral low back pain without sciatica 11/10/2022   Chronic lower back pain 09/05/2022   S/P closure of ileostomy 08/24/2022   Ileostomy in place [facility])    Morphea 07/13/2022   Colonic stricture  (HCC) 05/19/2022   Large bowel obstruction (HCC) 05/19/2022   Hypokalemia 05/18/2022   Abdominal pain 05/18/2022   Osteoarthritis of left knee 02/24/2022   Mixed connective tissue disease (HCC) 02/17/2022   Adhesive capsulitis of left shoulder 01/27/2022   Glenohumeral arthritis, left 01/27/2022   Microscopic hematuria 01/12/2022   Otorrhagia of right ear 12/10/2021   Elevated testosterone level in female 11/04/2021   Anti-TPO antibodies present 10/20/2021   Rash 10/13/2021   Hashimoto thyroiditis, 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   Hashimoto's disease 03/23/2021   Exposure to severe acute respiratory syndrome coronavirus 2 (SARS-CoV-2) 12/19/2020   Migraine without aura and without status migrainosus, not intractable 08/19/2020   Myofascial pain 07/30/2020   Neck pain 07/30/2020   Upper back pain 07/30/2020   Cervical facet joint syndrome 07/30/2020   Hair loss 07/14/2020   Edema of lower extremity 07/11/2020   Adenomatous colon polyp 02/26/2020   Gastroesophageal reflux disease 02/15/2020   Diarrhea 02/15/2020   Sleep disorder 01/16/2020   Hyperlipidemia 07/11/2019   Right shoulder pain 04/04/2019   Pre-operative clearance 12/13/2018   Endometrial cancer (HCC) 04/05/2018   Patellofemoral pain syndrome of right knee 03/20/2018   Gastroenteritis 12/07/2017   Fatigue 11/16/2017   Morbid obesity (HCC) 08/11/2017   Stucco keratoses 08/11/2017   Essential hypertension 08/09/2017   History of endometrial cancer 08/09/2017   Lymphedema 08/09/2017   Menopause 08/09/2017   Malignant neoplastic disease (HCC) 04/07/2017   Cellulitis 09/18/2016   Benign hypertension 06/02/2016   Asthma 06/04/1969    PCP: Eather Golder MD   REFERRING PROVIDER: Eather Golder MD   REFERRING DIAG: s/p R hip replacement  THERAPY DIAG:  Muscle weakness (generalized)  Stiffness of right hip, not elsewhere classified  Pain in right hip  Difficulty in walking, not elsewhere  classified  Unsteadiness on feet  Rationale for Evaluation and Treatment: Rehabilitation  ONSET DATE: 11/14/23  SUBJECTIVE:   SUBJECTIVE STATEMENT:     Patient reports feeling pretty good and states went to beach and walked on sand. Reports 2/10 Right hip "joint" pain      PERTINENT HISTORY: s/p R hip arthropaslasty 11/14/23 with Dr. Clyda Dark posterior approach, blood coagulation disorder, PTSD, anxiety, cervical radiculopathy dypsnea, polyarthralgia, Raynaud disease, s/p closure of ileostomy, Hashimoto thyroid , endometrial cancer, menopause, endometrial cancer, lymphedema. Patient had home health PT prior to PT here.   PAIN:  Are you having pain?  2/10 Rt posterior deep hip and medial posterior gluteals   PRECAUTIONS: Other: recovering from hip sx  RED FLAGS: None   WEIGHT BEARING RESTRICTIONS: No  FALLS:  Has patient fallen in last 6 months? Yes. Number of falls 2  LIVING ENVIRONMENT: Lives with: lives with their spouse Lives in: House/apartment Stairs: No Has following equipment at home: Single point cane  OCCUPATION: retired,   PLOF: Independent  PATIENT GOALS: return to hiking, working out, gardening.   NEXT MD VISIT: March 2025  OBJECTIVE:  Note: Objective measures were completed at Evaluation unless otherwise noted.  DIAGNOSTIC FINDINGS:  IMPRESSION: "Right hip replacement.  No visible complicating feature."  PATIENT SURVEYS:  LEFS 5/80  COGNITION: Overall cognitive status: Within functional limits for tasks assessed    MUSCLE LENGTH: Hamstrings: limited bilaterally with R>L   LOWER EXTREMITY ROM:  Passive ROM Right  04/02/24 Right eval Left eval  Hip flexion >90 degrees 48 flexion PROM supine   Hip extension 0 degrees    Hip abduction ~25 degrees 5 degrees AROM     (Blank rows = not tested)  LOWER EXTREMITY MMT:  MMT Right 04/02/24 Right eval Left eval  Hip flexion 4/5 2+ 5  Hip extension (supine 30 deg) 2-/5   5  Hip Hor ADD 5/5     Hip abduction 2+/5 2+*  5  Hip Hor ABDCT 4+/5    Hip adduction  2+ 5  Hip internal rotation 4+/5    Hip external rotation 3-/5     Knee flexion 5/5 3 5   Knee extension 4+/5 3 5   Ankle dorsiflexion 5/5 3 5   Ankle plantarflexion 5/5 3 5    (Blank rows = not tested)   FUNCTIONAL TESTS:  5 times sit to stand: 29 6 minute walk test:  10 meter walk test: 15 seconds with SPC   GAIT: Distance walked: 60 ft Assistive device utilized: Single point cane Level of assistance: CGA Comments: antalgic gait pattern with decreased stance time RLE. Slight vaulting pattern.  Stair negotiation: -step to pattern lead with LLE, one hand on cane one hand on rail ascending. Descending step to pattern down with RLE.                                                                                                                               TREATMENT DATE:  05/14/24  Therex:  Hip mobility (seated hip flex/knee ext/mini hip ER/IR) ceased due to some right joint pain with Hip ER- switched to stand x 20 each without pain.  Hip circles around cone on top x 15 reps each.  Resistive monster walk x 10 feet and back x 2 with RTB Side step (resistance at ankles) - RTB x 10 feet and back x 2 Resistive Hip Ext using matrix cable system 2.5# x 10 reps each LE Seated ham curl with  BTB 2 sets x 10 reps each LE   TA: Sit to stand +overhead raise + calf raises- 10 reps          PATIENT EDUCATION:  Education details: exercise technique Person educated: Patient Education method: Explanation, Demonstration, Tactile cues, and Verbal cues Education comprehension: verbalized understanding, returned demonstration, verbal cues required, tactile cues required, and needs further education  HOME EXERCISE PROGRAM:  Access Code: [facility] URL: [email or web address] Date: 04/11/2024 Prepared by: Ferrell Hu  Exercises - Side Stepping with Counter Support  - 1 x daily - 7 x weekly - 2  sets - 10  reps - 5 hold - Standing Hip Extension  - 1 x daily - 7 x weekly - 2 sets - 10 reps - 5 hold - Standing March with Counter Support  - 1 x daily - 7 x weekly - 2 sets - 10 reps - 5 hold - Standing Hip Abduction with Counter Support  - 1 x daily - 7 x weekly - 2 sets - 10 reps - Seated Hamstring Stretch  - 1 x daily - 7 x weekly - 2 sets - 2 reps - 30 hold - Sit to Stand  - 1 x daily - 7 x weekly - 2 sets - 10 reps - 5 hold - Supine Heel Slide  - 1 x daily - 7 x weekly - 2 sets - 6 reps - Supine Gluteal Sets  - 1 x daily - 7 x weekly - 2 sets - 10 reps - Supine Bridge  - 1 x daily - 7 x weekly - 3 sets - 5 reps - 2-3sec hold - Supine Short Arc Quad  - 1 x daily - 7 x weekly - 3 sets - 15 reps - Small Range Straight Leg Raise  - 1 x daily - 7 x weekly - 3 sets - 5 reps - Hooklying Clamshell with Resistance  - 1 x daily - 7 x weekly - 3 sets - 15 reps - Prone Hip Extension with Bent Knee  - 1 x daily - 7 x weekly - 3 sets - 10 reps - Prone Hip Extension Sequence  - 1 x daily - 7 x weekly - 3 sets - 10 reps - Supine Hamstring Stretch with Strap  - 1-2 x daily - 3 sets - 30sec hold - Modified Thomas Stretch  - 1-2 x daily - 3 sets - 30 sec hold - Hip Flexor Stretch on Step  - 1-2 x daily - 3 sets - 30 sec hold - Standing Hamstring Stretch with Step  - 1 x daily - 7 x weekly - 3 sets - 30 sec hold - Supine Figure 4 Piriformis Stretch  - 1-2 x daily - 3 sets - 30 hold - Supine Piriformis Stretch  - 1-2 x daily - 3 sets - 30 sec hold        Access Code: [facility] URL: [email or web address] Date: 02/08/2024; updated 03/21/2024 Prepared by: [employer]  Exercises - Side Stepping with Counter Support  - 1 x daily - 7 x weekly - 2 sets - 10 reps - 5 hold - Standing Hip Extension  - 1 x daily - 7 x weekly - 2 sets - 10 reps - 5 hold - Standing March with Counter Support  - 1 x daily - 7 x weekly - 2 sets - 10 reps - 5 hold - Standing Hip Abduction with  Counter Support  - 1 x daily - 7 x weekly - 2 sets - 10 reps - Seated Hamstring Stretch  - 1 x daily - 7 x weekly - 2 sets - 2 reps - 30 hold - Sit to Stand  - 1 x daily - 7 x weekly - 2 sets - 10 reps - 5 hold - Supine Heel Slide  - 1 x daily - 7 x weekly - 2 sets - 6 reps - Supine Gluteal Sets  - 1 x daily - 7 x weekly - 2 sets - 10 reps - Supine Bridge  - 1 x daily - 7 x weekly - 3 sets -  5 reps - 2-3sec hold - Supine Short Arc Quad  - 1 x daily - 7 x weekly - 3 sets - 15 reps - Small Range Straight Leg Raise  - 1 x daily - 7 x weekly - 3 sets - 5 reps - Hooklying Clamshell with Resistance  - 1 x daily - 7 x weekly - 3 sets - 15 reps - Prone Hip Extension with Bent Knee  - 1 x daily - 7 x weekly - 3 sets - 10 reps - Prone Hip Extension Sequence  - 1 x daily - 7 x weekly - 3 sets - 10 reps  ASSESSMENT: CLINICAL IMPRESSION:    Patient responded very well to treatment concentrating on LE strengthening. She was able to perform well with resistive with hip strengthening- able to add some resistive exercises and denied any pain only fatigue.  Pt will continue to benefit from skilled therapy to address remaining deficits in order to improve overall QoL and return to PLOF.      OBJECTIVE IMPAIRMENTS: Abnormal gait, decreased activity tolerance, decreased balance, decreased coordination, decreased endurance, decreased mobility, difficulty walking, decreased ROM, decreased strength, hypomobility, increased edema, impaired perceived functional ability, impaired flexibility, improper body mechanics, postural dysfunction, and pain.   ACTIVITY LIMITATIONS: carrying, lifting, bending, sitting, standing, squatting, sleeping, stairs, transfers, bed mobility, bathing, toileting, dressing, reach over head, hygiene/grooming, locomotion level, and caring for others  PARTICIPATION LIMITATIONS: meal prep, cleaning, laundry, interpersonal relationship, driving, shopping, community activity, and yard  work  PERSONAL FACTORS: Age, Past/current experiences, Time since onset of injury/illness/exacerbation, and 3+ comorbidities: blood coagulation disorder, PTSD, anxiety, cervical radiculopathy dypsnea, polyarthralgia, Raynaud disease, s/p closure of ileostomy, Hashimoto thyroid , endometrial cancer, menopause, endometrial cancer, lymphedema are also affecting patient's functional outcome.   REHAB POTENTIAL: Good  CLINICAL DECISION MAKING: Evolving/moderate complexity  EVALUATION COMPLEXITY: Moderate   GOALS: Goals reviewed with patient? Yes  SHORT TERM GOALS: Target date: 01/19/2024  Patient will be independent in home exercise program to improve strength/mobility for better functional independence with ADLs. Baseline:1/30: HEP given 3/12: pt reports completing 3-4 days a week depending on soreness.  Goal status:  MET  LONG TERM GOALS: Target date: 06/04/2024  Patient (> 66 years old) will complete five times sit to stand test in < 10 seconds indicating an increased LE strength and improved balance. Baseline: 1/30: 29 seconds 3/12: 19.41 03/12/2024= 17.44 seconds- without UE support; 04/16/2024- 17.1 sec without UE support.  Goal status: PROGRESSING  2.  Patient will increase six minute walk test distance to >1000 for progression to community ambulator and improve gait ability Baseline: 430 ft in 5 min and required seated rest to end test due to fatigue, uses SPC 838ft with SPC and no standing or seated rest break; 03/12/2024= 950 feet with SPC; 04/16/2024= 870 without an AD Goal status: in progress   3.  Patient will increase 10 meter walk test to >1.22m/s as to improve gait speed for better community ambulation and to reduce fall risk. Baseline: 1/30: 15 seconds with SPC  Without SPC: 10.63 and 10.38 speed: 0.95 m/s; with SPC 10.98 and 11.18; 03/12/2024= 1.0 m/s; 04/16/2024= 0.93 m/s without SPC Goal status: PROGRESSING  4.  Patient will increase lower extremity functional scale to >60/80  to demonstrate improved functional mobility and increased tolerance with ADLs.  Baseline: 1/30: 5/80 3/12: 20/80; 03/12/2024= 29/80; 04/16/2024=13/80 Goal status: Progressing  PLAN:  PT FREQUENCY: 1-2x/week PT DURATION: 12 weeks PLANNED INTERVENTIONS: 97164- PT Re-evaluation, 97110-Therapeutic exercises, 97530- Therapeutic activity,  25366- Neuromuscular re-education, [phone]- Self Care, 74259- Manual therapy, [phone]- Gait training, [phone]- Splinting, 97014- Electrical stimulation (unattended), [phone]- Electrical stimulation (manual), [id number]- Ultrasound, 84166- Traction (mechanical), Patient/Family education, Balance training, Stair training, Taping, Joint mobilization, Joint manipulation, Spinal manipulation, Spinal mobilization, Manual lymph drainage, Scar mobilization, Compression bandaging, Vestibular training, Visual/preceptual remediation/compensation, DME instructions, Cryotherapy, and Moist heat  PLAN FOR NEXT SESSION:   Continue with gentle hip stretching Progress with RLE strengthening - use cable system for Hip Continue with progressive Dynamic balance- tandem, narrowed BOS, SLS   10:50 AM, 05/14/24 Ossie Blend, PT Physical Therapist - [facility]     05/14/24, 10:50 AM

## 2024-05-14 NOTE — Therapy (Signed)
 OUTPATIENT OCCUPATIONAL THERAPY TREATMENT NOTE  LOWER EXTREMITY LYMPHEDEMA  Patient Name: Elizabeth Martin MRN: [id number] DOB:[birth year], 63 y.o., female Today's Date: 05/14/2024   END OF SESSION:  Lymphedema Episode 2   OT End of Session - 05/14/24 1105     Visit Number 37    Number of Visits 72    Date for OT Re-Evaluation 07/17/24    OT Start Time 1100    OT Stop Time 1200    OT Time Calculation (min) 60 min    Activity Tolerance Patient tolerated treatment well;No increased pain    Behavior During Therapy WFL for tasks assessed/performed              Past Medical History:  Diagnosis Date   Anxiety    Asthma    Autoimmune disease (HCC)    Cancer (HCC)    Endometrial lymph node removal (30)   Corneal abrasion, right 05/24/2022   Depression    GERD (gastroesophageal reflux disease)    H/O blood clots    upper rt groin and behind both knees   History of hiatal hernia    Hypertension    Lymphedema    right leg   Morphea scleroderma    PONV (postoperative nausea and vomiting)    states gets violently ill   Past Surgical History:  Procedure Laterality Date   BASAL CELL CARCINOMA EXCISION Left    CHOLECYSTECTOMY  2008   FLEXIBLE SIGMOIDOSCOPY N/A 05/19/2022   Procedure: FLEXIBLE SIGMOIDOSCOPY;  Surgeon: Toledo, Alphonsus Jeans, MD;  Location: [facility];  Service: Gastroenterology;  Laterality: N/A;   HERNIA REPAIR  2004   ILEOSTOMY CLOSURE N/A 08/24/2022   Procedure: ILEOSTOMY TAKEDOWN, open loop with Apolonio Bay, PA-C to assist;  Surgeon: Emmalene Hare, MD;  Location: [facility];  Service: General;  Laterality: N/A;   IVC FILTER INSERTION N/A 11/09/2023   Procedure: IVC FILTER INSERTION;  Surgeon: Celso College, MD;  Location: [facility];  Service: Cardiovascular;  Laterality: N/A;   IVC FILTER REMOVAL N/A 01/02/2024   Procedure: IVC FILTER REMOVAL;  Surgeon: Celso College, MD;  Location: [facility];  Service: Cardiovascular;  Laterality:  N/A;   PLANTAR FASCIECTOMY     ROBOTIC ASSISTED TOTAL HYSTERECTOMY WITH BILATERAL SALPINGO OOPHERECTOMY  02/14/2012   ROTATOR CUFF REPAIR Left 03/24/2022   TONSILLECTOMY     TOTAL HIP ARTHROPLASTY Right 11/14/2023   Procedure: TOTAL HIP ARTHROPLASTY - posterior;  Surgeon: Venus Ginsberg, MD;  Location: [facility];  Service: Orthopedics;  Laterality: Right;   XI ROBOTIC ASSISTED LOWER ANTERIOR RESECTION N/A 05/24/2022   Procedure: XI ROBOTIC ASSISTED LOWER ANTERIOR RESECTION;  Surgeon: Emmalene Hare, MD;  Location: [facility];  Service: General;  Laterality: N/A;   Patient Active Problem List   Diagnosis Date Noted   Constipation 01/10/2024   Nausea & vomiting 01/10/2024   S/P total right hip arthroplasty 11/14/2023   Blood coagulation disorder (HCC) 11/10/2023   PTSD (post-traumatic stress disorder) 11/10/2023   Severe episode of recurrent major depressive disorder, without psychotic features (HCC) 11/10/2023   GAD (generalized anxiety disorder) 11/10/2023   High risk medication use 11/10/2023   Anxiety and depression 03/22/2023   Cervical radiculopathy 03/16/2023   Colon cancer screening 03/15/2023   Chronic idiopathic constipation 03/15/2023   Dyspnea 03/07/2023   [facility] spotted fever 03/07/2023   Strain of gastrocnemius tendon 03/07/2023   Lower abdominal pain 03/07/2023   Pain in pelvis 03/07/2023   Small bowel obstruction (HCC) 02/11/2023  Raynaud disease 02/11/2023   Polyarthralgia 02/11/2023   Edema 01/03/2023   Diverticular disease 12/10/2022   Hot flashes 12/10/2022   Pain of right lower extremity 12/10/2022   Paresthesia of lower extremity 12/10/2022   Primary insomnia 12/10/2022   Thyroid  nodule 12/10/2022   Multiple joint pain 12/10/2022   Basal cell carcinoma of skin 12/10/2022   Acute bilateral low back pain without sciatica 11/10/2022   Chronic lower back pain 09/05/2022   S/P closure of ileostomy 08/24/2022   Ileostomy in place [facility])    Morphea  07/13/2022   Colonic stricture (HCC) 05/19/2022   Large bowel obstruction (HCC) 05/19/2022   Hypokalemia 05/18/2022   Abdominal pain 05/18/2022   Osteoarthritis of left knee 02/24/2022   Mixed connective tissue disease (HCC) 02/17/2022   Adhesive capsulitis of left shoulder 01/27/2022   Glenohumeral arthritis, left 01/27/2022   Microscopic hematuria 01/12/2022   Otorrhagia of right ear 12/10/2021   Elevated testosterone level in female 11/04/2021   Anti-TPO antibodies present 10/20/2021   Rash 10/13/2021   Hashimoto thyroiditis, 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   Hashimoto's disease 03/23/2021   Exposure to severe acute respiratory syndrome coronavirus 2 (SARS-CoV-2) 12/19/2020   Migraine without aura and without status migrainosus, not intractable 08/19/2020   Myofascial pain 07/30/2020   Neck pain 07/30/2020   Upper back pain 07/30/2020   Cervical facet joint syndrome 07/30/2020   Hair loss 07/14/2020   Edema of lower extremity 07/11/2020   Adenomatous colon polyp 02/26/2020   Gastroesophageal reflux disease 02/15/2020   Diarrhea 02/15/2020   Sleep disorder 01/16/2020   Hyperlipidemia 07/11/2019   Right shoulder pain 04/04/2019   Pre-operative clearance 12/13/2018   Endometrial cancer (HCC) 04/05/2018   Patellofemoral pain syndrome of right knee 03/20/2018   Gastroenteritis 12/07/2017   Fatigue 11/16/2017   Morbid obesity (HCC) 08/11/2017   Stucco keratoses 08/11/2017   Essential hypertension 08/09/2017   History of endometrial cancer 08/09/2017   Lymphedema 08/09/2017   Menopause 08/09/2017   Malignant neoplastic disease (HCC) 04/07/2017   Cellulitis 09/18/2016   Benign hypertension 06/02/2016   Asthma 06/04/1969    PCP: Eather Golder, MD  REFERRING PROVIDER: Eather Golder, MD  REFERRING DIAG: I89.0   THERAPY DIAG:  Lymphedema, not elsewhere classified  Rationale for Evaluation and Treatment: Rehabilitation  ONSET DATE: 2013  (Cancer-related, endometrial  2013)  SUBJECTIVE:                                                                                                                                                                                           SUBJECTIVE STATEMENT:Patient returns to OT for lymphedema care to RLE/RLQ.  Pt  rates LE-related pain at 3/10 today. Pt had PT before OT session today. Pt reports she has not yet heard from [employer] rep about replacing Flexi garments. Garments must be replaced before she can resume using device. OT contacted rep for the 3rd time via email after visit to request she call Pt w status report ASAP.Manufacturer's rep notified OT that manufacturer is able to comp the custom garments we measured for recently. Pt will have these for her upcoming trip.  PERTINENT HISTORY: Asthma, Autoimmune Disease (Connective Tissue Disease), Endometrial Ca w/ LND ( 30 bilateral pelvic and periaortic LN), adjuvant chemotherapy and XRT,   H/O blood clots, RLE/RLQ lymphedema, Robotic assisted total hysterectomy w/ bilateral oophorectomy L Rotator Cuff repair, S/P ileostomy 2/2 colon stricture 05/2022; s/p R hip arthroplasty 11/11/23, scleroderma morphia bilateral groin  PAIN:  Are you having pain? YES. See subjective; 3/10 Pain location: RLE, R hip, cervical spine Pain description: sore, aching, heavy, full, tight,  Aggravating factors: standing, walking, extended dependent sitting Relieving factors: elevation, movement  PRECAUTIONS: Fall and Other: LYMPHEDEMA  WEIGHT BEARING RESTRICTIONS: Yes 5# lifting restriction- shoulder  FALLS:  Has patient fallen in last 6 months? No  LIVING ENVIRONMENT: Lives with: lives with their spouse Lives in: House/apartment Stairs: No;  Has following equipment at home: Single point cane, Environmental consultant - 2 wheeled, Environmental consultant - 4 wheeled, and Wheelchair (manual)  OCCUPATION: retired Control and instrumentation engineer: gardening, hiking, biking, dancing- unable to participate in any of these due to pain  and swelling  HAND DOMINANCE: right   PRIOR LEVEL OF FUNCTION: Independent with basic ADLs, Independent with household mobility without device, Independent with community mobility without device, Requires assistive device for independence, Needs assistance with homemaking, Needs assistance with transfers, and Leisure: decreased  social participation for leisure pursuits due to impaired mobility and pain  PATIENT GOALS:  Be able to move freely without pain Reduce limb volume to be able to lift limb for basic daily ADLs and functional ambulation Reduce limb volume to increase ability to perform lower body dressing and bathing and grooming Reduce limb to increase body image  OBJECTIVE: Note: Objective measures were completed at Evaluation unless otherwise noted.  COGNITION:  Overall cognitive status: Within functional limits for tasks assessed   OBSERVATIONS / OTHER ASSESSMENTS:   POSTURE: WFL  LE ROM: WFL, but Limited mildly at R knee and ankle due to girth, skin approximation, and joint pain  LE MMT: WFL  Mild, Stage  II, Bilateral Lower Extremity Lymphedema 2/2 CVI and Obesity  Skin  Description Hyper-Keratosis Peau d' Orange Shiny Tight Fibrotic/ Indurated Fatty Doughy Spongy/ boggy   x x x x R>L Severe morphea at  R groin obstructing lymphatics and skin excursion   x   Skin dry Flaky WNL Macerated   mildly sclerotic     Color Redness Varicosities Blanching Hemosiderin Stain Mottled   x x x   x   Odor Malodorous Yeast Fungal infection  WNL      x   Temperature Warm Cool wnl    x     Pitting Edema   1+ 2+ 3+ 4+ Non-pitting         x   Girth Symmetrical Asymmetrical                   Distribution    R>L RLE toes to groin    Stemmer Sign Positive Negative   +    Lymphorrhea History Of:  Present Absent  x    Wounds History Of Present Absent Venous Arterial Pressure Sheer     x        Signs of Infection Redness Warmth Erythema Acute Swelling  Drainage Borders                    Sensation Light Touch Deep pressure Hypersensitivity   In tact Impaired In tact Impaired Absent Impaired   x  x  x     Nails WNL   Fungus nail dystrophy   x     Hair Growth Symmetrical Asymmetrical    R>L   Skin Creases Base of toes  Ankles   Base of Fingers knees       Abdominal pannus Thigh Lobules  Face/neck   x x  x        BLE COMPARATIVE LIMB VOLUMETRICS 10/10/23  LANDMARK RIGHT  10/10/23  R LEG (A-D) 5466.5 ml  R THIGH (E-G) 9186.6 ml  R FULL LIMB (A-G) 14653.1 ml  Limb Volume differential (LVD)  LVD for LEG = 44.1%, R>L LVD for THIGH= 33.98%, R>L LVD for FULL lower extremity 37.8%, R>L  Volume change since last 08/11/22 R LEG is INCREASED in volume by 59.6%. L THIGH is INCREASED by 38 %, and RLE full limb is INCREASED by 45.38%.  Volume change overall V  (Blank rows = not tested)  LANDMARK LEFT  10/12/23  L LEG (A-D) 3053.6 ml  L THIGH (E-G) 6064.8 ml  L  FULL LIMB (A-G) 9118.4 ml  Limb Volume differential (LVD)  %  Volume change since initial %  Volume change overall %  (Blank rows = not tested)   10 th VISIT PROGRESS NOTE RLE COMPARATIVE LIMB VOLUMETRICS 01/04/24: Deferred until next visit by Pt request.  LANDMARK RIGHT    R LEG (A-D) TBA  R THIGH (E-G) TBA  R FULL LIMB (A-G) TBA  Limb Volume differential (LVD)    Volume change since last 08/11/22 TBA  Volume change overall TBA  (Blank rows = not tested)  20 th VISIT PROGRESS NOTE RLE COMPARATIVE LIMB VOLUMETRICS TBA next session  LANDMARK RIGHT    R LEG (A-D) 6005.7 ml  R THIGH (E-G) 9400.3 ml  R FULL LIMB (A-G) 15406.02 ml  Limb Volume differential (LVD)    Volume change since last measured on 10/10/23 R LEG is INCREASED in volume by 9.9%. L THIGH is INCREASED by 2.32 %, and RLE full limb is INCREASED by 5.13% since last measured on 10/10/23.  Volume change since commencing OT for CDT %    28 th VISIT PROGRESS NOTE RLE COMPARATIVE LIMB VOLUMETRICS TBA next  session  LANDMARK RIGHT    R LEG (A-D) 5936.1 ml  R THIGH (E-G) 8538.6  ml  R FULL LIMB (A-G) 14524.7  ml  Limb Volume differential (LVD)    Volume change since last measured on 10/10/23 R LEG is DECREASED in volume by 1.2 %.  Since last measured on 03/14/24. R THIGH is DECREASED by 8.6 %, and RLE full limb is DECREASED by 5.72 % since last measured on 03/14/24.  Volume change since commencing OT for CDT %   ENDOMETRIAL CA-related LYMPHEDEMA Hx:  SURGERY TYPE/DATE: 2013 NUMBER OF LYMPH NODES REMOVED: 30 by report- bilateral pelvic and periaortic CHEMOTHERAPY: yes RADIATION:yes INFECTIONS: no hx cellulitis  LYMPHEDEMA LIFE IMPACT SCALE (LLIS): Intake 10/12/23 75% (The extent to which lymphedema related problems impacted your life over the past week)  FOTO (functional outcome measure):10/10/23 INTAKE:  36% No Out take score. FOTO assessment discontinued by clinic.  PATIENT EDUCATION:  Education details: Continued Pt/ CG edu for lymphedema self care home program throughout session. Topics include outcome of comparative limb volumetrics- starting limb volume differentials (LVDs), technology and gradient techniques used for short stretch, multilayer compression wrapping, simple self-MLD, therapeutic lymphatic pumping exercises, skin/nail care, LE precautions,. compression garment recommendations and specifications, wear and care schedule and compression garment donning / doffing w assistive devices. Discussed progress towards all OT goals since commencing CDT. All questions answered to the Pt's satisfaction. Good return. Person educated: Patient Education method: Explanation, Demonstration, and Handouts Education comprehension: verbalized understanding, returned demonstration, and needs further education  LE SELF-CARE HOME  PROGRAM: Simple self-MLD/daily to affected quadrant and body part At least 2 x daily BLE Lymphatic Pumping There ex 1 set of 10 reps each, in order, bilaterally Daily skin care  to affected body part to limit infection risk and increase skin excursion Compression Bandaging Intensive stage compression: multilayer short stretch wraps with gradient techniques. One limb at a time. Length patient dependent. Self-management Phase: Appropriate daytime compression garment and hours-of-sleep device  Compression garments: Custom-made gradient compression garments and hours-of-sleep devices are medically necessary because they are uniquely sized and shaped to fit the exact dimensions of the affected extremities and to provide accurate and consistent gradient compression and containment, essential  for optimally managing chronic, progressive lymphedema. The convoluted HOS devices are medically necessary to facilitate increased lymphatic circulation and limit fibrosis formation when sleeping. Multiple custom compression garments are needed for optimal hygiene to limit infection risk. Custom compression garments should be replaced q 3-6 months When worn consistently for optimal lipo-lymphedema self-management over time.  ASSESSMENT:  CLINICAL IMPRESSION:  At present RLE/RLQ continues to be intractable to CDT.  Limb density and swelling are minimally reduced since last visit, but thigh has softened slightly. 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 Lymphedema- related skin changes include below the knee extending to the ankle,  hyperkeratosis on the R foot and small, lymphatic cysts on the leg, and finally a thickened rough patch at base of toes  Continued RLE/RLQ MLD in side lying and supine as established working proximally to distal from neck, deep abdominal pathways, functional axillary LN, and J strokes  from thigh to toes. Pt tolerated MLD and deeper fibrosis techniques without increased pain. After MLD we Applied LLE multilayer compression wraps as established. We added  closed cell foam kidneys to both medial and lateral malleoli. Continue to support Pt in her efforts to obtain medically necessary custom compression garments and devices.  Cont 3 x weekly as per POC.  10/10/23 Lymphedema Episode 2, Initial OT Evaluation : Kalynne Womac is a 59 y a female presenting with chronic, progressive, moderate, Stage II, RLE/RLQ cancer -related lymphedema with onset 11 years ago after Rx for endometrial cancer. Pt is well known to this therapist as she has successfully undergone Complete Decongestive Therapy (CDT)  this clinic bin the past. Pt reports recent exacerbation of RLE swelling worsened as inflammation in L hip has worsened over time.  Pt has a hip replacement scheduled in late December here it [facility]. RE limb volumetrics today reveal that R LEG volume is dramatically increased by 59.65 since last visit. R LEG VOLUME is INCREASED in volume by 59.6%. L THIGH is INCREASED by 38 %, and RLE full limb is INCREASED by 45.38%. Pt presents  with LE-related skin changes. Prolonged inflammation in the R hip joint has likely overloaded  already RLE/RLQ lymphatics.  RLE/RLQ lymphedema limits Pt's ability to perform basic and instrumental ADLs, including functional ambulation, mobility and transfers, grooming , lower body bathing and dressing, skin inspection and skin care. Pt has difficulty  reaching her lower legs and feet to apply compression wraps and don/doff        compression stockings. LE limits ability to P[perform instrumental ADLs, including driving, yard work and home management activities. Lymphedema limits her ability to participate in leisure pursuits and productive activities, and it negatively impacts body image, life roles and quality of life.   Pt will benefit from an Intensive and follow along course of lymphedema. CDT will consist of Manual Lymphatic gainage   (MLD), skin care, therapeutic exercise and compression, wraps initially then garments. Pt will need assistance  throughout CDT   for applying compression wraps due to limited hip AROM and decreased skin flexibility. Without skilled Occupational Therapy for lymphedema care, lymphedema will progress and further functional decline is expected.   In prep for upcoming hip replacement OT will educate Pt re assistive devices, including tub transfer bench and elevated toilet seat, in keeping with hip precautions.  OBJECTIVE IMPAIRMENTS: Abnormal gait, decreased activity tolerance, decreased balance, decreased knowledge of use of DME, decreased mobility, difficulty walking, decreased ROM, decreased strength, increased edema, decreased skin flexibility, increased fascial restrictions, impaired sensation, pain, and chronic, progressive LLE/LLQ lymphatic swelling and associated pain.   ADL LIMITATIONS: carrying, lifting, bending, sitting, standing, squatting, sleeping, stairs, transfers, bed mobility, bathing, dressing, hygiene/grooming, and productive activities, leisure pursuits, social participation, body image driving, shopping, housework, yard work, cooking, meal prep  PERSONAL FACTORS: Past/current experiences and 3+ comorbidities: Morphea Scleroderma, Mixed connective tissue disease, and osteoarthritis are also affecting patient's functional outcome.   REHAB POTENTIAL: Good  EVALUATION COMPLEXITY: Moderate  GOALS: Goals reviewed with patient? Yes  SHORT TERM GOALS: Target date: 4th OT Rx visit   Pt will demonstrate understanding of lymphedema precautions and prevention strategies with modified independence using a printed reference to identify at least 5 precautions and discussing how s/he may implement them into daily life to reduce risk of progression with extra time.  Baseline:Max A Goal status: GOAL MET  2.  Pt will be able to apply multilayer, knee length, gradient, compression wraps to one leg at a time with modified assistance (extra time and assistive device/s) to decrease limb volume, to limit  infection risk, and to limit lymphedema progression.  Baseline: Dependent Goal status: GOAL MET  LONG TERM GOALS: Target date: 01/09/24 (12 weeks)  Given this patient's Intake score 36% on the functional outcomes FOTO tool, patient will experience an increase in function of 3 points to improve basic and instrumental ADLs performance, including lymphedema self-care.  Baseline: Max A Goal status: DEFERRED as [employer] has been discontinued  2.  Given this patient's Intake score of  75% on the Lymphedema Life Impact Scale (LLIS), patient will experience a reduction of at least 5 points in her perceived level of functional impairment resulting from lymphedema to improve functional performance and quality of life (QOL). Baseline: % Goal status: PROGRESSING  3.  Pt will achieve at least a 10% volume reduction in full, RLE limb volume to return limb to typical size and shape, to limit infection risk and LE progression, to decrease pain, to improve function. Baseline: Dependent Goal status:PROGRESSING. See chart   4.  Pt will obtain proper compression garments/devices and  achieve modified independence (extra time + assistive devices) with donning/doffing to optimize limb volume reductions and limit LE  progression over time. Baseline:  Goal status: 01/04/24: PARTIALLY MET; In an effort to reduce burden of care on her spouse, Pt obtained adjustable, Velcro style R full leg, Circaid leg reduction kit for use s/p THA, but this proved not effective for controlling swelling, and were not easier to don and doff more independently. We've resumed wrapping until she is able to fit back into custom compression garments. 02/14/24: Ongoing 04/11/24: Submitted new garment measurements to DME vendor for 3 piece system to address worsening lymphedema. Pt's insurance company denied coverage. She in the process of appealing.  5.  During Intensive phase CDT , with modified independence, Pt will achieve at least 85% compliance  with all lymphedema self-care home program components, including daily skin care, compression wraps and /or garments, simple self MLD and lymphatic pumping therex to habituate LE self care protocol  into ADLs for optimal LE self-management over time. Baseline: Dependent Goal status: 02/14/24: GOAL MET and EXCEEDED PLAN:  OT FREQUENCY: 2-3 x/week  OT DURATION: 12 weeks and PRN  PLANNED INTERVENTIONS: Complete Decongestive Therapy (Intensive and supported Self-Management Phases), 97110-Therapeutic exercises, 97530- Therapeutic activity, 97535- Self Care, 14782- Manual therapy, Patient/Family education, Taping, Manual lymph drainage, Scar mobilization, Compression bandaging, DME instructions, and skin care to reduce infection risk throughout manual therapy. Fit with replacement compression garments ASAP  PLAN FOR NEXT SESSION:  Cont MLD as established Cont multilayer compression wrapping   Arnold Bicker, MS, OTR/L, CLT-LANA 05/14/24 12:55 PM

## 2024-05-16 ENCOUNTER — Encounter: Payer: Self-pay | Admitting: Occupational Therapy

## 2024-05-16 ENCOUNTER — Ambulatory Visit

## 2024-05-16 ENCOUNTER — Ambulatory Visit (INDEPENDENT_AMBULATORY_CARE_PROVIDER_SITE_OTHER): Admitting: Licensed Clinical Social Worker

## 2024-05-16 ENCOUNTER — Ambulatory Visit: Admitting: Occupational Therapy

## 2024-05-16 DIAGNOSIS — M25551 Pain in right hip: Secondary | ICD-10-CM

## 2024-05-16 DIAGNOSIS — R262 Difficulty in walking, not elsewhere classified: Secondary | ICD-10-CM

## 2024-05-16 DIAGNOSIS — F3341 Major depressive disorder, recurrent, in partial remission: Secondary | ICD-10-CM

## 2024-05-16 DIAGNOSIS — F4310 Post-traumatic stress disorder, unspecified: Secondary | ICD-10-CM | POA: Diagnosis not present

## 2024-05-16 DIAGNOSIS — M6281 Muscle weakness (generalized): Secondary | ICD-10-CM

## 2024-05-16 DIAGNOSIS — F411 Generalized anxiety disorder: Secondary | ICD-10-CM | POA: Diagnosis not present

## 2024-05-16 DIAGNOSIS — M25651 Stiffness of right hip, not elsewhere classified: Secondary | ICD-10-CM

## 2024-05-16 DIAGNOSIS — I890 Lymphedema, not elsewhere classified: Secondary | ICD-10-CM

## 2024-05-16 DIAGNOSIS — R2681 Unsteadiness on feet: Secondary | ICD-10-CM

## 2024-05-16 NOTE — Therapy (Signed)
 OUTPATIENT OCCUPATIONAL THERAPY TREATMENT NOTE  LOWER EXTREMITY LYMPHEDEMA  Patient Name: Elizabeth Martin MRN: [id number] DOB:March 18, 1961, 63 y.o., female Today's Date: 05/16/2024   END OF SESSION:  Lymphedema Episode 2   OT End of Session - 05/16/24 1112     Visit Number 38    Number of Visits 72    Date for OT Re-Evaluation 07/17/24    OT Start Time 1110    Activity Tolerance Patient tolerated treatment well;No increased pain    Behavior During Therapy WFL for tasks assessed/performed              Past Medical History:  Diagnosis Date   Anxiety    Asthma    Autoimmune disease (HCC)    Cancer (HCC)    Endometrial lymph node removal (30)   Corneal abrasion, right 05/24/2022   Depression    GERD (gastroesophageal reflux disease)    H/O blood clots    upper rt groin and behind both knees   History of hiatal hernia    Hypertension    Lymphedema    right leg   Morphea scleroderma    PONV (postoperative nausea and vomiting)    states gets violently ill   Past Surgical History:  Procedure Laterality Date   BASAL CELL CARCINOMA EXCISION Left    CHOLECYSTECTOMY  2008   FLEXIBLE SIGMOIDOSCOPY N/A 05/19/2022   Procedure: FLEXIBLE SIGMOIDOSCOPY;  Surgeon: Toledo, Alphonsus Jeans, MD;  Location: [facility];  Service: Gastroenterology;  Laterality: N/A;   HERNIA REPAIR  2004   ILEOSTOMY CLOSURE N/A 08/24/2022   Procedure: ILEOSTOMY TAKEDOWN, open loop with Apolonio Bay, PA-C to assist;  Surgeon: Emmalene Hare, MD;  Location: [facility];  Service: General;  Laterality: N/A;   IVC FILTER INSERTION N/A 11/09/2023   Procedure: IVC FILTER INSERTION;  Surgeon: Celso College, MD;  Location: [facility];  Service: Cardiovascular;  Laterality: N/A;   IVC FILTER REMOVAL N/A 01/02/2024   Procedure: IVC FILTER REMOVAL;  Surgeon: Celso College, MD;  Location: [facility];  Service: Cardiovascular;  Laterality: N/A;   PLANTAR FASCIECTOMY     ROBOTIC ASSISTED TOTAL  HYSTERECTOMY WITH BILATERAL SALPINGO OOPHERECTOMY  02/14/2012   ROTATOR CUFF REPAIR Left 03/24/2022   TONSILLECTOMY     TOTAL HIP ARTHROPLASTY Right 11/14/2023   Procedure: TOTAL HIP ARTHROPLASTY - posterior;  Surgeon: Venus Ginsberg, MD;  Location: [facility];  Service: Orthopedics;  Laterality: Right;   XI ROBOTIC ASSISTED LOWER ANTERIOR RESECTION N/A 05/24/2022   Procedure: XI ROBOTIC ASSISTED LOWER ANTERIOR RESECTION;  Surgeon: Emmalene Hare, MD;  Location: [facility];  Service: General;  Laterality: N/A;   Patient Active Problem List   Diagnosis Date Noted   Constipation 01/10/2024   Nausea & vomiting 01/10/2024   S/P total right hip arthroplasty 11/14/2023   Blood coagulation disorder (HCC) 11/10/2023   PTSD (post-traumatic stress disorder) 11/10/2023   Severe episode of recurrent major depressive disorder, without psychotic features (HCC) 11/10/2023   GAD (generalized anxiety disorder) 11/10/2023   High risk medication use 11/10/2023   Anxiety and depression 03/22/2023   Cervical radiculopathy 03/16/2023   Colon cancer screening 03/15/2023   Chronic idiopathic constipation 03/15/2023   Dyspnea 03/07/2023   [place] spotted fever 03/07/2023   Strain of gastrocnemius tendon 03/07/2023   Lower abdominal pain 03/07/2023   Pain in pelvis 03/07/2023   Small bowel obstruction (HCC) 02/11/2023   Raynaud disease 02/11/2023   Polyarthralgia 02/11/2023   Edema 01/03/2023   Diverticular  disease 12/10/2022   Hot flashes 12/10/2022   Pain of right lower extremity 12/10/2022   Paresthesia of lower extremity 12/10/2022   Primary insomnia 12/10/2022   Thyroid  nodule 12/10/2022   Multiple joint pain 12/10/2022   Basal cell carcinoma of skin 12/10/2022   Acute bilateral low back pain without sciatica 11/10/2022   Chronic lower back pain 09/05/2022   S/P closure of ileostomy 08/24/2022   Ileostomy in place [facility])    Morphea 07/13/2022   Colonic stricture (HCC) 05/19/2022   Large  bowel obstruction (HCC) 05/19/2022   Hypokalemia 05/18/2022   Abdominal pain 05/18/2022   Osteoarthritis of left knee 02/24/2022   Mixed connective tissue disease (HCC) 02/17/2022   Adhesive capsulitis of left shoulder 01/27/2022   Glenohumeral arthritis, left 01/27/2022   Microscopic hematuria 01/12/2022   Otorrhagia of right ear 12/10/2021   Elevated testosterone level in female 11/04/2021   Anti-TPO antibodies present 10/20/2021   Rash 10/13/2021   Hashimoto thyroiditis, 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   Hashimoto's disease 03/23/2021   Exposure to severe acute respiratory syndrome coronavirus 2 (SARS-CoV-2) 12/19/2020   Migraine without aura and without status migrainosus, not intractable 08/19/2020   Myofascial pain 07/30/2020   Neck pain 07/30/2020   Upper back pain 07/30/2020   Cervical facet joint syndrome 07/30/2020   Hair loss 07/14/2020   Edema of lower extremity 07/11/2020   Adenomatous colon polyp 02/26/2020   Gastroesophageal reflux disease 02/15/2020   Diarrhea 02/15/2020   Sleep disorder 01/16/2020   Hyperlipidemia 07/11/2019   Right shoulder pain 04/04/2019   Pre-operative clearance 12/13/2018   Endometrial cancer (HCC) 04/05/2018   Patellofemoral pain syndrome of right knee 03/20/2018   Gastroenteritis 12/07/2017   Fatigue 11/16/2017   Morbid obesity (HCC) 08/11/2017   Stucco keratoses 08/11/2017   Essential hypertension 08/09/2017   History of endometrial cancer 08/09/2017   Lymphedema 08/09/2017   Menopause 08/09/2017   Malignant neoplastic disease (HCC) 04/07/2017   Cellulitis 09/18/2016   Benign hypertension 06/02/2016   Asthma 06/04/1969    PCP: Eather Golder, MD  REFERRING PROVIDER: Eather Golder, MD  REFERRING DIAG: I89.0   THERAPY DIAG:  Lymphedema, not elsewhere classified  Rationale for Evaluation and Treatment: Rehabilitation  ONSET DATE: 2013  (Cancer-related, endometrial 2013)  SUBJECTIVE:                                                                                                                                                                                            SUBJECTIVE STATEMENT:Patient returns to OT for lymphedema care to RLE/RLQ.  Pt rates LE-related pain at 3/10 today. Pt had PT before OT session today. Pt  reports she has not yet heard from [employer] rep about replacing Flexi garments. Garments must be replaced before she can resume using device. OT contacted rep for the 3rd time via email after visit to request she call Pt w status report ASAP.Manufacturer's rep notified OT that manufacturer is able to comp the custom garments we measured for recently. Pt will have these for her upcoming trip to [place] . Pt reports she is graduating from PT for hip rehab.  PERTINENT HISTORY: Asthma, Autoimmune Disease (Connective Tissue Disease), Endometrial Ca w/ LND ( 30 bilateral pelvic and periaortic LN), adjuvant chemotherapy and XRT,   H/O blood clots, RLE/RLQ lymphedema, Robotic assisted total hysterectomy w/ bilateral oophorectomy L Rotator Cuff repair, S/P ileostomy 2/2 colon stricture 05/2022; s/p R hip arthroplasty 11/11/23, scleroderma morphia bilateral groin  PAIN:  Are you having pain? YES. See subjective; 3/10 Pain location: RLE, R hip, cervical spine Pain description: sore, aching, heavy, full, tight,  Aggravating factors: standing, walking, extended dependent sitting Relieving factors: elevation, movement  PRECAUTIONS: Fall and Other: LYMPHEDEMA  WEIGHT BEARING RESTRICTIONS: Yes 5# lifting restriction- shoulder  FALLS:  Has patient fallen in last 6 months? No  LIVING ENVIRONMENT: Lives with: lives with their spouse Lives in: House/apartment Stairs: No;  Has following equipment at home: Single point cane, Environmental consultant - 2 wheeled, Environmental consultant - 4 wheeled, and Wheelchair (manual)  OCCUPATION: retired Control and instrumentation engineer: gardening, hiking, biking, dancing- unable to participate in any of these due to pain and  swelling  HAND DOMINANCE: right   PRIOR LEVEL OF FUNCTION: Independent with basic ADLs, Independent with household mobility without device, Independent with community mobility without device, Requires assistive device for independence, Needs assistance with homemaking, Needs assistance with transfers, and Leisure: decreased  social participation for leisure pursuits due to impaired mobility and pain  PATIENT GOALS:  Be able to move freely without pain Reduce limb volume to be able to lift limb for basic daily ADLs and functional ambulation Reduce limb volume to increase ability to perform lower body dressing and bathing and grooming Reduce limb to increase body image  OBJECTIVE: Note: Objective measures were completed at Evaluation unless otherwise noted.  COGNITION:  Overall cognitive status: Within functional limits for tasks assessed   OBSERVATIONS / OTHER ASSESSMENTS:   POSTURE: WFL  LE ROM: WFL, but Limited mildly at R knee and ankle due to girth, skin approximation, and joint pain  LE MMT: WFL  Mild, Stage  II, Bilateral Lower Extremity Lymphedema 2/2 CVI and Obesity  Skin  Description Hyper-Keratosis Peau d' Orange Shiny Tight Fibrotic/ Indurated Fatty Doughy Spongy/ boggy   x x x x R>L Severe morphea at  R groin obstructing lymphatics and skin excursion   x   Skin dry Flaky WNL Macerated   mildly sclerotic     Color Redness Varicosities Blanching Hemosiderin Stain Mottled   x x x   x   Odor Malodorous Yeast Fungal infection  WNL      x   Temperature Warm Cool wnl    x     Pitting Edema   1+ 2+ 3+ 4+ Non-pitting         x   Girth Symmetrical Asymmetrical                   Distribution    R>L RLE toes to groin    Stemmer Sign Positive Negative   +    Lymphorrhea History Of:  Present Absent     x  Wounds History Of Present Absent Venous Arterial Pressure Sheer     x        Signs of Infection Redness Warmth Erythema Acute Swelling Drainage  Borders                    Sensation Light Touch Deep pressure Hypersensitivity   In tact Impaired In tact Impaired Absent Impaired   x  x  x     Nails WNL   Fungus nail dystrophy   x     Hair Growth Symmetrical Asymmetrical    R>L   Skin Creases Base of toes  Ankles   Base of Fingers knees       Abdominal pannus Thigh Lobules  Face/neck   x x  x        BLE COMPARATIVE LIMB VOLUMETRICS 10/10/23  LANDMARK RIGHT  10/10/23  R LEG (A-D) 5466.5 ml  R THIGH (E-G) 9186.6 ml  R FULL LIMB (A-G) 14653.1 ml  Limb Volume differential (LVD)  LVD for LEG = 44.1%, R>L LVD for THIGH= 33.98%, R>L LVD for FULL lower extremity 37.8%, R>L  Volume change since last 08/11/22 R LEG is INCREASED in volume by 59.6%. L THIGH is INCREASED by 38 %, and RLE full limb is INCREASED by 45.38%.  Volume change overall V  (Blank rows = not tested)  LANDMARK LEFT  10/12/23  L LEG (A-D) 3053.6 ml  L THIGH (E-G) 6064.8 ml  L  FULL LIMB (A-G) 9118.4 ml  Limb Volume differential (LVD)  %  Volume change since initial %  Volume change overall %  (Blank rows = not tested)   10 th VISIT PROGRESS NOTE RLE COMPARATIVE LIMB VOLUMETRICS 01/04/24: Deferred until next visit by Pt request.  LANDMARK RIGHT    R LEG (A-D) TBA  R THIGH (E-G) TBA  R FULL LIMB (A-G) TBA  Limb Volume differential (LVD)    Volume change since last 08/11/22 TBA  Volume change overall TBA  (Blank rows = not tested)  20 th VISIT PROGRESS NOTE RLE COMPARATIVE LIMB VOLUMETRICS TBA next session  LANDMARK RIGHT    R LEG (A-D) 6005.7 ml  R THIGH (E-G) 9400.3 ml  R FULL LIMB (A-G) 15406.02 ml  Limb Volume differential (LVD)    Volume change since last measured on 10/10/23 R LEG is INCREASED in volume by 9.9%. L THIGH is INCREASED by 2.32 %, and RLE full limb is INCREASED by 5.13% since last measured on 10/10/23.  Volume change since commencing OT for CDT %    28 th VISIT PROGRESS NOTE RLE COMPARATIVE LIMB VOLUMETRICS TBA next  session  LANDMARK RIGHT    R LEG (A-D) 5936.1 ml  R THIGH (E-G) 8538.6  ml  R FULL LIMB (A-G) 14524.7  ml  Limb Volume differential (LVD)    Volume change since last measured on 10/10/23 R LEG is DECREASED in volume by 1.2 %.  Since last measured on 03/14/24. R THIGH is DECREASED by 8.6 %, and RLE full limb is DECREASED by 5.72 % since last measured on 03/14/24.  Volume change since commencing OT for CDT %   ENDOMETRIAL CA-related LYMPHEDEMA Hx:  SURGERY TYPE/DATE: 2013 NUMBER OF LYMPH NODES REMOVED: 30 by report- bilateral pelvic and periaortic CHEMOTHERAPY: yes RADIATION:yes INFECTIONS: no hx cellulitis  LYMPHEDEMA LIFE IMPACT SCALE (LLIS): Intake 10/12/23 75% (The extent to which lymphedema related problems impacted your life over the past week)  FOTO (functional outcome measure):10/10/23 INTAKE: 36% No Out take  score. FOTO assessment discontinued by clinic.  PATIENT EDUCATION:  Education details: Continued Pt/ CG edu for lymphedema self care home program throughout session. Topics include outcome of comparative limb volumetrics- starting limb volume differentials (LVDs), technology and gradient techniques used for short stretch, multilayer compression wrapping, simple self-MLD, therapeutic lymphatic pumping exercises, skin/nail care, LE precautions,. compression garment recommendations and specifications, wear and care schedule and compression garment donning / doffing w assistive devices. Discussed progress towards all OT goals since commencing CDT. All questions answered to the Pt's satisfaction. Good return. Person educated: Patient Education method: Explanation, Demonstration, and Handouts Education comprehension: verbalized understanding, returned demonstration, and needs further education  LE SELF-CARE HOME  PROGRAM: Simple self-MLD/daily to affected quadrant and body part At least 2 x daily BLE Lymphatic Pumping There ex 1 set of 10 reps each, in order, bilaterally Daily skin care  to affected body part to limit infection risk and increase skin excursion Compression Bandaging Intensive stage compression: multilayer short stretch wraps with gradient techniques. One limb at a time. Length patient dependent. Self-management Phase: Appropriate daytime compression garment and hours-of-sleep device  Compression garments: Custom-made gradient compression garments and hours-of-sleep devices are medically necessary because they are uniquely sized and shaped to fit the exact dimensions of the affected extremities and to provide accurate and consistent gradient compression and containment, essential  for optimally managing chronic, progressive lymphedema. The convoluted HOS devices are medically necessary to facilitate increased lymphatic circulation and limit fibrosis formation when sleeping. Multiple custom compression garments are needed for optimal hygiene to limit infection risk. Custom compression garments should be replaced q 3-6 months When worn consistently for optimal lipo-lymphedema self-management over time.  ASSESSMENT:  CLINICAL IMPRESSION:  At present RLE/RLQ continues to be intractable to CDT.  Limb density and swelling are minimally reduced since last visit, but thigh has softened slightly. 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 Lymphedema- related skin changes include below the knee extending to the ankle,  hyperkeratosis on the R foot and small, lymphatic cysts on the leg, and finally a thickened rough patch at base of toes  Continued RLE/RLQ MLD in side lying and supine as established working proximally to distal from neck, deep abdominal pathways, functional axillary LN, and J strokes  from thigh to toes. Pt tolerated MLD and deeper fibrosis techniques without increased pain. After MLD we Applied LLE multilayer compression wraps as established. We added  closed cell foam kidneys to both medial and lateral malleoli. Continue to support Pt in her efforts to obtain medically necessary custom compression garments and devices.  Cont 3 x weekly as per POC.  10/10/23 Lymphedema Episode 2, Initial OT Evaluation : Arlenis Blaydes is a 52 y a female presenting with chronic, progressive, moderate, Stage II, RLE/RLQ cancer -related lymphedema with onset 11 years ago after Rx for endometrial cancer. Pt is well known to this therapist as she has successfully undergone Complete Decongestive Therapy (CDT)  this clinic bin the past. Pt reports recent exacerbation of RLE swelling worsened as inflammation in L hip has worsened over time.  Pt has a hip replacement scheduled in late December here it [facility]. RE limb volumetrics today reveal that R LEG volume is dramatically increased by 59.65 since last visit. R LEG VOLUME is INCREASED in volume by 59.6%. L THIGH is INCREASED by 38 %, and RLE full limb is INCREASED by 45.38%. Pt presents with LE-related skin changes.  Prolonged inflammation in the R hip joint has likely overloaded  already RLE/RLQ lymphatics.  RLE/RLQ lymphedema limits Pt's ability to perform basic and instrumental ADLs, including functional ambulation, mobility and transfers, grooming , lower body bathing and dressing, skin inspection and skin care. Pt has difficulty  reaching her lower legs and feet to apply compression wraps and don/doff        compression stockings. LE limits ability to P[perform instrumental ADLs, including driving, yard work and home management activities. Lymphedema limits her ability to participate in leisure pursuits and productive activities, and it negatively impacts body image, life roles and quality of life.   Pt will benefit from an Intensive and follow along course of lymphedema. CDT will consist of Manual Lymphatic gainage   (MLD), skin care, therapeutic exercise and compression, wraps initially then garments. Pt will need assistance  throughout CDT   for applying compression wraps due to limited hip AROM and decreased skin flexibility. Without skilled Occupational Therapy for lymphedema care, lymphedema will progress and further functional decline is expected.   In prep for upcoming hip replacement OT will educate Pt re assistive devices, including tub transfer bench and elevated toilet seat, in keeping with hip precautions.  OBJECTIVE IMPAIRMENTS: Abnormal gait, decreased activity tolerance, decreased balance, decreased knowledge of use of DME, decreased mobility, difficulty walking, decreased ROM, decreased strength, increased edema, decreased skin flexibility, increased fascial restrictions, impaired sensation, pain, and chronic, progressive LLE/LLQ lymphatic swelling and associated pain.   ADL LIMITATIONS: carrying, lifting, bending, sitting, standing, squatting, sleeping, stairs, transfers, bed mobility, bathing, dressing, hygiene/grooming, and productive activities, leisure pursuits, social participation, body image driving, shopping, housework, yard work, cooking, meal prep  PERSONAL FACTORS: Past/current experiences and 3+ comorbidities: Morphea Scleroderma, Mixed connective tissue disease, and osteoarthritis are also affecting patient's functional outcome.   REHAB POTENTIAL: Good  EVALUATION COMPLEXITY: Moderate  GOALS: Goals reviewed with patient? Yes  SHORT TERM GOALS: Target date: 4th OT Rx visit   Pt will demonstrate understanding of lymphedema precautions and prevention strategies with modified independence using a printed reference to identify at least 5 precautions and discussing how s/he may implement them into daily life to reduce risk of progression with extra time.  Baseline:Max A Goal status: GOAL MET  2.  Pt will be able to apply multilayer, knee length, gradient, compression wraps to one leg at a time with modified assistance (extra time and assistive device/s) to decrease limb volume, to limit  infection risk, and to limit lymphedema progression.  Baseline: Dependent Goal status: GOAL MET  LONG TERM GOALS: Target date: 01/09/24 (12 weeks)  Given this patient's Intake score 36% on the functional outcomes FOTO tool, patient will experience an increase in function of 3 points to improve basic and instrumental ADLs performance, including lymphedema self-care.  Baseline: Max A Goal status: DEFERRED as [employer] has been discontinued  2.  Given this patient's Intake score of  75% on the Lymphedema Life Impact Scale (LLIS), patient will experience a reduction of at least 5 points in her perceived level of functional impairment resulting from lymphedema to improve functional performance and quality of life (QOL). Baseline: % Goal status: PROGRESSING  3.  Pt will achieve at least a 10% volume reduction in full, RLE limb volume to return limb to typical size and shape, to limit infection risk and LE progression, to decrease pain, to improve function. Baseline: Dependent Goal status:PROGRESSING. See chart   4.  Pt will obtain proper compression garments/devices and achieve modified independence (extra  time + assistive devices) with donning/doffing to optimize limb volume reductions and limit LE  progression over time. Baseline:  Goal status: 01/04/24: PARTIALLY MET; In an effort to reduce burden of care on her spouse, Pt obtained adjustable, Velcro style R full leg, Circaid leg reduction kit for use s/p THA, but this proved not effective for controlling swelling, and were not easier to don and doff more independently. We've resumed wrapping until she is able to fit back into custom compression garments. 02/14/24: Ongoing 04/11/24: Submitted new garment measurements to DME vendor for 3 piece system to address worsening lymphedema. Pt's insurance company denied coverage. She in the process of appealing.  5.  During Intensive phase CDT , with modified independence, Pt will achieve at least 85% compliance  with all lymphedema self-care home program components, including daily skin care, compression wraps and /or garments, simple self MLD and lymphatic pumping therex to habituate LE self care protocol  into ADLs for optimal LE self-management over time. Baseline: Dependent Goal status: 02/14/24: GOAL MET and EXCEEDED PLAN:  OT FREQUENCY: 2-3 x/week  OT DURATION: 12 weeks and PRN  PLANNED INTERVENTIONS: Complete Decongestive Therapy (Intensive and supported Self-Management Phases), 97110-Therapeutic exercises, 97530- Therapeutic activity, 97535- Self Care, [id number]- Manual therapy, Patient/Family education, Taping, Manual lymph drainage, Scar mobilization, Compression bandaging, DME instructions, and skin care to reduce infection risk throughout manual therapy. Fit with replacement compression garments ASAP  PLAN FOR NEXT SESSION:  Cont MLD as established Cont multilayer compression wrapping   Arnold Bicker, MS, OTR/L, CLT-LANA 05/16/24 11:14 AM

## 2024-05-16 NOTE — Therapy (Signed)
 OUTPATIENT PHYSICAL THERAPY TREATMENT   Patient Name: Elizabeth Martin MRN: [id number] DOB:14-Apr-1961, 63 y.o., female Today's Date: 05/16/2024  END OF SESSION:  PT End of Session - 05/16/24 1021     Visit Number 27    Number of Visits 35    Date for PT Re-Evaluation 06/04/24    Authorization Type [employer]    Progress Note Due on Visit 30    PT Start Time 1017    PT Stop Time 1058    PT Time Calculation (min) 41 min    Equipment Utilized During Treatment Gait belt    Activity Tolerance Patient tolerated treatment well    Behavior During Therapy WFL for tasks assessed/performed                         Past Medical History:  Diagnosis Date   Anxiety    Asthma    Autoimmune disease (HCC)    Cancer (HCC)    Endometrial lymph node removal (30)   Corneal abrasion, right 05/24/2022   Depression    GERD (gastroesophageal reflux disease)    H/O blood clots    upper rt groin and behind both knees   History of hiatal hernia    Hypertension    Lymphedema    right leg   Morphea scleroderma    PONV (postoperative nausea and vomiting)    states gets violently ill   Past Surgical History:  Procedure Laterality Date   BASAL CELL CARCINOMA EXCISION Left    CHOLECYSTECTOMY  2008   FLEXIBLE SIGMOIDOSCOPY N/A 05/19/2022   Procedure: FLEXIBLE SIGMOIDOSCOPY;  Surgeon: Toledo, Alphonsus Jeans, MD;  Location: [facility] ENDOSCOPY;  Service: Gastroenterology;  Laterality: N/A;   HERNIA REPAIR  2004   ILEOSTOMY CLOSURE N/A 08/24/2022   Procedure: ILEOSTOMY TAKEDOWN, open loop with Apolonio Bay, PA-C to assist;  Surgeon: Emmalene Hare, MD;  Location: [facility] ORS;  Service: General;  Laterality: N/A;   IVC FILTER INSERTION N/A 11/09/2023   Procedure: IVC FILTER INSERTION;  Surgeon: Celso College, MD;  Location: [facility] INVASIVE CV [facility];  Service: Cardiovascular;  Laterality: N/A;   IVC FILTER REMOVAL N/A 01/02/2024   Procedure: IVC FILTER REMOVAL;  Surgeon: Celso College, MD;  Location: [facility]  INVASIVE CV LAB;  Service: Cardiovascular;  Laterality: N/A;   PLANTAR FASCIECTOMY     ROBOTIC ASSISTED TOTAL HYSTERECTOMY WITH BILATERAL SALPINGO OOPHERECTOMY  02/14/2012   ROTATOR CUFF REPAIR Left 03/24/2022   TONSILLECTOMY     TOTAL HIP ARTHROPLASTY Right 11/14/2023   Procedure: TOTAL HIP ARTHROPLASTY - posterior;  Surgeon: Venus Ginsberg, MD;  Location: [facility] ORS;  Service: Orthopedics;  Laterality: Right;   XI ROBOTIC ASSISTED LOWER ANTERIOR RESECTION N/A 05/24/2022   Procedure: XI ROBOTIC ASSISTED LOWER ANTERIOR RESECTION;  Surgeon: Emmalene Hare, MD;  Location: [facility] ORS;  Service: General;  Laterality: N/A;   Patient Active Problem List   Diagnosis Date Noted   Constipation 01/10/2024   Nausea & vomiting 01/10/2024   S/P total right hip arthroplasty 11/14/2023   Blood coagulation disorder (HCC) 11/10/2023   PTSD (post-traumatic stress disorder) 11/10/2023   Severe episode of recurrent major depressive disorder, without psychotic features (HCC) 11/10/2023   GAD (generalized anxiety disorder) 11/10/2023   High risk medication use 11/10/2023   Anxiety and depression 03/22/2023   Cervical radiculopathy 03/16/2023   Colon cancer screening 03/15/2023   Chronic idiopathic constipation 03/15/2023   Dyspnea 03/07/2023   [facility] spotted fever 03/07/2023   Strain  of gastrocnemius tendon 03/07/2023   Lower abdominal pain 03/07/2023   Pain in pelvis 03/07/2023   Small bowel obstruction (HCC) 02/11/2023   Raynaud disease 02/11/2023   Polyarthralgia 02/11/2023   Edema 01/03/2023   Diverticular disease 12/10/2022   Hot flashes 12/10/2022   Pain of right lower extremity 12/10/2022   Paresthesia of lower extremity 12/10/2022   Primary insomnia 12/10/2022   Thyroid  nodule 12/10/2022   Multiple joint pain 12/10/2022   Basal cell carcinoma of skin 12/10/2022   Acute bilateral low back pain without sciatica 11/10/2022   Chronic lower back pain 09/05/2022   S/P closure of ileostomy  08/24/2022   Ileostomy in place [facility])    Morphea 07/13/2022   Colonic stricture (HCC) 05/19/2022   Large bowel obstruction (HCC) 05/19/2022   Hypokalemia 05/18/2022   Abdominal pain 05/18/2022   Osteoarthritis of left knee 02/24/2022   Mixed connective tissue disease (HCC) 02/17/2022   Adhesive capsulitis of left shoulder 01/27/2022   Glenohumeral arthritis, left 01/27/2022   Microscopic hematuria 01/12/2022   Otorrhagia of right ear 12/10/2021   Elevated testosterone level in female 11/04/2021   Anti-TPO antibodies present 10/20/2021   Rash 10/13/2021   Hashimoto thyroiditis, 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   Hashimoto's disease 03/23/2021   Exposure to severe acute respiratory syndrome coronavirus 2 (SARS-CoV-2) 12/19/2020   Migraine without aura and without status migrainosus, not intractable 08/19/2020   Myofascial pain 07/30/2020   Neck pain 07/30/2020   Upper back pain 07/30/2020   Cervical facet joint syndrome 07/30/2020   Hair loss 07/14/2020   Edema of lower extremity 07/11/2020   Adenomatous colon polyp 02/26/2020   Gastroesophageal reflux disease 02/15/2020   Diarrhea 02/15/2020   Sleep disorder 01/16/2020   Hyperlipidemia 07/11/2019   Right shoulder pain 04/04/2019   Pre-operative clearance 12/13/2018   Endometrial cancer (HCC) 04/05/2018   Patellofemoral pain syndrome of right knee 03/20/2018   Gastroenteritis 12/07/2017   Fatigue 11/16/2017   Morbid obesity (HCC) 08/11/2017   Stucco keratoses 08/11/2017   Essential hypertension 08/09/2017   History of endometrial cancer 08/09/2017   Lymphedema 08/09/2017   Menopause 08/09/2017   Malignant neoplastic disease (HCC) 04/07/2017   Cellulitis 09/18/2016   Benign hypertension 06/02/2016   Asthma 06/04/1969    PCP: Eather Golder MD   REFERRING PROVIDER: Eather Golder MD   REFERRING DIAG: s/p R hip replacement  THERAPY DIAG:  Muscle weakness (generalized)  Stiffness of right hip, not elsewhere  classified  Pain in right hip  Difficulty in walking, not elsewhere classified  Unsteadiness on feet  Rationale for Evaluation and Treatment: Rehabilitation  ONSET DATE: 11/14/23  SUBJECTIVE:   SUBJECTIVE STATEMENT:     Patient reports feeling a little sore after last session but that's not a bad thing       PERTINENT HISTORY: s/p R hip arthropaslasty 11/14/23 with Dr. Clyda Dark posterior approach, blood coagulation disorder, PTSD, anxiety, cervical radiculopathy dypsnea, polyarthralgia, Raynaud disease, s/p closure of ileostomy, Hashimoto thyroid , endometrial cancer, menopause, endometrial cancer, lymphedema. Patient had home health PT prior to PT here.   PAIN:  Are you having pain?  2/10 Rt posterior deep hip and medial posterior gluteals   PRECAUTIONS: Other: recovering from hip sx  RED FLAGS: None   WEIGHT BEARING RESTRICTIONS: No  FALLS:  Has patient fallen in last 6 months? Yes. Number of falls 2  LIVING ENVIRONMENT: Lives with: lives with their spouse Lives in: House/apartment Stairs: No Has following equipment at home: Single point cane  OCCUPATION: retired,  PLOF: Independent  PATIENT GOALS: return to hiking, working out, gardening.   NEXT MD VISIT: March 2025   OBJECTIVE:  Note: Objective measures were completed at Evaluation unless otherwise noted.  DIAGNOSTIC FINDINGS:  IMPRESSION: Right hip replacement.  No visible complicating feature.  PATIENT SURVEYS:  LEFS 5/80  COGNITION: Overall cognitive status: Within functional limits for tasks assessed    MUSCLE LENGTH: Hamstrings: limited bilaterally with R>L   LOWER EXTREMITY ROM:  Passive ROM Right  04/02/24 Right eval Left eval  Hip flexion >90 degrees 48 flexion PROM supine   Hip extension 0 degrees    Hip abduction ~25 degrees 5 degrees AROM     (Blank rows = not tested)  LOWER EXTREMITY MMT:  MMT Right 04/02/24 Right eval Left eval  Hip flexion 4/5 2+ 5  Hip extension  (supine 30 deg) 2-/5   5  Hip Hor ADD 5/5    Hip abduction 2+/5 2+*  5  Hip Hor ABDCT 4+/5    Hip adduction  2+ 5  Hip internal rotation 4+/5    Hip external rotation 3-/5     Knee flexion 5/5 3 5   Knee extension 4+/5 3 5   Ankle dorsiflexion 5/5 3 5   Ankle plantarflexion 5/5 3 5    (Blank rows = not tested)   FUNCTIONAL TESTS:  5 times sit to stand: 29 6 minute walk test:  10 meter walk test: 15 seconds with SPC   GAIT: Distance walked: 60 ft Assistive device utilized: Single point cane Level of assistance: CGA Comments: antalgic gait pattern with decreased stance time RLE. Slight vaulting pattern.  Stair negotiation: -step to pattern lead with LLE, one hand on cane one hand on rail ascending. Descending step to pattern down with RLE.                                                                                                                               TREATMENT DATE:  05/16/24   Self care: Discussion of pending plan for d/c from PT right before she goes on vacation   Therex:  Hip mobility Flexor stretch, hamstring, calf Hip circles x 15 reps each.   TA: Sit to stand +overhead raise + calf raises- 10 reps  Step ups onto 6 block x 12 reps each LE   NMR:   Ball toss at [employer] with SLS- x 10 tosses each x 2 sets (patient reports as challenging)   Side step over several 1/2 spike balls positioned in line approx 1.5 feet apart down and back x 3  Simulate monster walk with use of 1/2 spike balls positioned approx 1.5 feet apart- down and back x 3.   SLS with reach for cone on top of 6 step (able to perform standing on LLE yet unable to reach cone when standing on RLE- yet was able to lean forward and reach 1/2 way) x 10 reps each Side.   Forward dynamic slow intentional  high knee march and then large retro steps - down and back x 5           PATIENT EDUCATION:  Education details: exercise technique Person educated: Patient Education method:  Explanation, Demonstration, Tactile cues, and Verbal cues Education comprehension: verbalized understanding, returned demonstration, verbal cues required, tactile cues required, and needs further education  HOME EXERCISE PROGRAM:  Access Code: [facility] URL: [email or web address] Date: 04/11/2024 Prepared by: Ferrell Hu  Exercises - Side Stepping with Counter Support  - 1 x daily - 7 x weekly - 2 sets - 10 reps - 5 hold - Standing Hip Extension  - 1 x daily - 7 x weekly - 2 sets - 10 reps - 5 hold - Standing March with Counter Support  - 1 x daily - 7 x weekly - 2 sets - 10 reps - 5 hold - Standing Hip Abduction with Counter Support  - 1 x daily - 7 x weekly - 2 sets - 10 reps - Seated Hamstring Stretch  - 1 x daily - 7 x weekly - 2 sets - 2 reps - 30 hold - Sit to Stand  - 1 x daily - 7 x weekly - 2 sets - 10 reps - 5 hold - Supine Heel Slide  - 1 x daily - 7 x weekly - 2 sets - 6 reps - Supine Gluteal Sets  - 1 x daily - 7 x weekly - 2 sets - 10 reps - Supine Bridge  - 1 x daily - 7 x weekly - 3 sets - 5 reps - 2-3sec hold - Supine Short Arc Quad  - 1 x daily - 7 x weekly - 3 sets - 15 reps - Small Range Straight Leg Raise  - 1 x daily - 7 x weekly - 3 sets - 5 reps - Hooklying Clamshell with Resistance  - 1 x daily - 7 x weekly - 3 sets - 15 reps - Prone Hip Extension with Bent Knee  - 1 x daily - 7 x weekly - 3 sets - 10 reps - Prone Hip Extension Sequence  - 1 x daily - 7 x weekly - 3 sets - 10 reps - Supine Hamstring Stretch with Strap  - 1-2 x daily - 3 sets - 30sec hold - Modified Thomas Stretch  - 1-2 x daily - 3 sets - 30 sec hold - Hip Flexor Stretch on Step  - 1-2 x daily - 3 sets - 30 sec hold - Standing Hamstring Stretch with Step  - 1 x daily - 7 x weekly - 3 sets - 30 sec hold - Supine Figure 4 Piriformis Stretch  - 1-2 x daily - 3 sets - 30 hold - Supine Piriformis Stretch  - 1-2 x daily - 3 sets - 30 sec hold        Access Code:  [facility] URL: [email or web address] Date: 02/08/2024; updated 03/21/2024 Prepared by: [employer]  Exercises - Side Stepping with Counter Support  - 1 x daily - 7 x weekly - 2 sets - 10 reps - 5 hold - Standing Hip Extension  - 1 x daily - 7 x weekly - 2 sets - 10 reps - 5 hold - Standing March with Counter Support  - 1 x daily - 7 x weekly - 2 sets - 10 reps - 5 hold - Standing Hip Abduction with Counter Support  - 1 x daily - 7 x weekly - 2 sets - 10 reps -  Seated Hamstring Stretch  - 1 x daily - 7 x weekly - 2 sets - 2 reps - 30 hold - Sit to Stand  - 1 x daily - 7 x weekly - 2 sets - 10 reps - 5 hold - Supine Heel Slide  - 1 x daily - 7 x weekly - 2 sets - 6 reps - Supine Gluteal Sets  - 1 x daily - 7 x weekly - 2 sets - 10 reps - Supine Bridge  - 1 x daily - 7 x weekly - 3 sets - 5 reps - 2-3sec hold - Supine Short Arc Quad  - 1 x daily - 7 x weekly - 3 sets - 15 reps - Small Range Straight Leg Raise  - 1 x daily - 7 x weekly - 3 sets - 5 reps - Hooklying Clamshell with Resistance  - 1 x daily - 7 x weekly - 3 sets - 15 reps - Prone Hip Extension with Bent Knee  - 1 x daily - 7 x weekly - 3 sets - 10 reps - Prone Hip Extension Sequence  - 1 x daily - 7 x weekly - 3 sets - 10 reps  ASSESSMENT: CLINICAL IMPRESSION:    Patient continues to respond well in session to various interventions. Focus today was more on balance and patient responded very well- challenged with some SLS on Right LE yet was able to demonstrate some adaptation and progress with improved stability with practice.   Pt will continue to benefit from skilled therapy to address remaining deficits in order to improve overall QoL and return to PLOF.      OBJECTIVE IMPAIRMENTS: Abnormal gait, decreased activity tolerance, decreased balance, decreased coordination, decreased endurance, decreased mobility, difficulty walking, decreased ROM, decreased strength, hypomobility, increased edema,  impaired perceived functional ability, impaired flexibility, improper body mechanics, postural dysfunction, and pain.   ACTIVITY LIMITATIONS: carrying, lifting, bending, sitting, standing, squatting, sleeping, stairs, transfers, bed mobility, bathing, toileting, dressing, reach over head, hygiene/grooming, locomotion level, and caring for others  PARTICIPATION LIMITATIONS: meal prep, cleaning, laundry, interpersonal relationship, driving, shopping, community activity, and yard work  PERSONAL FACTORS: Age, Past/current experiences, Time since onset of injury/illness/exacerbation, and 3+ comorbidities: blood coagulation disorder, PTSD, anxiety, cervical radiculopathy dypsnea, polyarthralgia, Raynaud disease, s/p closure of ileostomy, Hashimoto thyroid , endometrial cancer, menopause, endometrial cancer, lymphedema are also affecting patient's functional outcome.   REHAB POTENTIAL: Good  CLINICAL DECISION MAKING: Evolving/moderate complexity  EVALUATION COMPLEXITY: Moderate   GOALS: Goals reviewed with patient? Yes  SHORT TERM GOALS: Target date: 01/19/2024  Patient will be independent in home exercise program to improve strength/mobility for better functional independence with ADLs. Baseline:1/30: HEP given 3/12: pt reports completing 3-4 days a week depending on soreness.  Goal status:  MET  LONG TERM GOALS: Target date: 06/04/2024  Patient (> 73 years old) will complete five times sit to stand test in < 10 seconds indicating an increased LE strength and improved balance. Baseline: 1/30: 29 seconds 3/12: 19.41 03/12/2024= 17.44 seconds- without UE support; 04/16/2024- 17.1 sec without UE support.  Goal status: PROGRESSING  2.  Patient will increase six minute walk test distance to >1000 for progression to community ambulator and improve gait ability Baseline: 430 ft in 5 min and required seated rest to end test due to fatigue, uses SPC 824ft with SPC and no standing or seated rest break;  03/12/2024= 950 feet with SPC; 04/16/2024= 870 without an AD Goal status: in progress   3.  Patient  will increase 10 meter walk test to >1.65m/s as to improve gait speed for better community ambulation and to reduce fall risk. Baseline: 1/30: 15 seconds with SPC  Without SPC: 10.63 and 10.38 speed: 0.95 m/s; with SPC 10.98 and 11.18; 03/12/2024= 1.0 m/s; 04/16/2024= 0.93 m/s without SPC Goal status: PROGRESSING  4.  Patient will increase lower extremity functional scale to >60/80 to demonstrate improved functional mobility and increased tolerance with ADLs.  Baseline: 1/30: 5/80 3/12: 20/80; 03/12/2024= 29/80; 04/16/2024=13/80 Goal status: Progressing  PLAN:  PT FREQUENCY: 1-2x/week PT DURATION: 12 weeks PLANNED INTERVENTIONS: 97164- PT Re-evaluation, 97110-Therapeutic exercises, 97530- Therapeutic activity, 97112- Neuromuscular re-education, 97535- Self Care, 40981- Manual therapy, 97116- Gait training, [phone]- Splinting, 97014- Electrical stimulation (unattended), [phone]- Electrical stimulation (manual), 97035- Ultrasound, 21308- Traction (mechanical), Patient/Family education, Balance training, Stair training, Taping, Joint mobilization, Joint manipulation, Spinal manipulation, Spinal mobilization, Manual lymph drainage, Scar mobilization, Compression bandaging, Vestibular training, Visual/preceptual remediation/compensation, DME instructions, Cryotherapy, and Moist heat  PLAN FOR NEXT SESSION:   Continue with gentle hip stretching Progress with RLE strengthening - use cable system for Hip Continue with progressive Dynamic balance- tandem, narrowed BOS, SLS   11:12 AM, 05/16/24 Ossie Blend, PT Physical Therapist - [facility]     05/16/24, 11:12 AM

## 2024-05-16 NOTE — Progress Notes (Signed)
 THERAPIST PROGRESS NOTE  Session Time: 9:04am-10am  Participation Level: Active  Behavioral Response: CasualAlertEuthymic  Type of Therapy: Individual Therapy  Treatment Goals addressed:  Problem: OP Depression     Dates: Start:  11/25/23       Disciplines: Interdisciplinary, PROVIDER        Goal: LTG: Reduce frequency, intensity, and duration of depression symptoms so that daily functioning is improved     Dates: Start:  11/25/23    Expected End:  04/24/24       Disciplines: Interdisciplinary, PROVIDER         Outcomes     Date/Time User Outcome    05/16/24 1003 Elizabeth Martin P Progressing    03/20/24 1105 Elizabeth Martin P Progressing            Goal note from Counselor 05/16/2024 by Elizabeth Jacquet B, LCSW     05/16/24: Patient reports she has noticed significant improvements with her overall depressive symptoms citing frequent socialization, frequent physical activity, overall improved mood.              Goal note from Counselor 03/20/2024 by Elizabeth Jacquet B, LCSW     03/20/24: 95% progress When I start thinking about how bad things were I try and replace them with good things.                     Goal: STG: Elizabeth Martin will identify cognitive patterns and beliefs that support depression     Dates: Start:  11/25/23    Expected End:  04/24/24       Disciplines: Interdisciplinary, PROVIDER         Outcomes     Date/Time User Outcome    05/16/24 1003 Elizabeth Martin P Progressing    03/20/24 1105 Elizabeth Martin P Progressing            Goal note from Counselor 03/20/2024 by Elizabeth Jacquet B, LCSW     03/20/24: 70% progress I catch the negative thoughts before they spiral.                    Goal: LTG: Pt identifies the goal to Accept chronic pain and learning coping skills      Dates: Start:  11/25/23    Expected End:  04/24/24       Disciplines: Interdisciplinary, PROVIDER         Outcomes     Date/Time User Outcome    05/16/24 1003 Elizabeth Martin P Progressing    03/20/24  1105 Elizabeth Martin P Progressing            Goal note from Counselor 05/16/2024 by Elizabeth Slot, LCSW     05/16/24: Patient reports successful ability to reframe her perspective and practice gratitude.              Goal note from Counselor 03/20/2024 by Elizabeth Slot, LCSW     03/20/24: 30% progress                     Intervention: Work with Elizabeth Martin to identify the major components of a recent episode of depression: physical symptoms, major thoughts and images, and major behaviors they experienced     Dates: Start:  11/25/23                Intervention: Therapist will educate patient on cognitive distortions and the rationale for treatment of depression     Dates: Start:  11/25/23  Intervention: Elizabeth Martin will identify 2 cognitive distortions they are currently using and write reframing statements to replace them     Dates: Start:  11/25/23                Intervention: Coping Skills      Dates: Start:  11/25/23       Description: Will work with the pt using CBT/DBT techniques to help the pt verbalize an understanding of the cognitive, physiological, and behavioral components of depression and its treatment. This will be done by using worksheets, interactive activities, CBT/ABC thought logs, modeling, homework, role playing and journaling. Will work with pt to learn and implement coping skills that result in a reduction of depression and improve daily functioning per pt self report 3 out of 5 documented sessions.                   Template: PTSD (OP)         Problem: BH CCP Acute or Chronic Trauma Reaction     Dates: Start:  11/25/23       Disciplines: Interdisciplinary, PROVIDER        Goal: LTG: Elimination of maladaptive behaviors and thinking patterns which interfere with resolution of trauma as evidenced by self report     Dates: Start:  11/25/23    Expected End:  04/24/24       Disciplines: Interdisciplinary, PROVIDER         Outcomes      Date/Time User Outcome    05/16/24 1003 Elizabeth Martin P Progressing    03/20/24 1105 Elizabeth Martin P Progressing            Goal note from Counselor 05/16/2024 by Elizabeth Slot, LCSW     05/16/24: Patient demonstrates mastery of reframing unhelpful cognitions in therapeutic sessions. She also shares positive reflections regarding times in which she was able to cope with trauma symptoms from unwanted memories.               Goal note from Counselor 03/20/2024 by Elizabeth Jacquet B, LCSW     03/20/24: 70% progress; As far as my family issues I am doing pretty good. Shares she is fearful of her reactiveness but states she is getting better at coping those thoughts. Would like to understand how to address misplaced blame related to how she raised her children.                     Goal: LTG: Develop and implement effective coping skills to carry out normal responsibilities and participate constructively in relationships as evidenced by self report     Dates: Start:  11/25/23    Expected End:  04/24/24       Disciplines: Interdisciplinary, PROVIDER         Outcomes     Date/Time User Outcome    05/16/24 1003 Elizabeth Martin P Progressing    03/20/24 1105 Elizabeth Martin P Progressing            Goal note from Counselor 05/16/2024 by Elizabeth Jacquet B, LCSW     05/16/24: She reports successful use of her coping skills to manage sxs.               Goal note from Counselor 03/20/2024 by Elizabeth Slot, LCSW     03/20/24: 95% progress; It takes me a while to think of what I need to do.  Goal: LTG: Recall traumatic events without becoming overwhelmed with negative emotions     Dates: Start:  11/25/23    Expected End:  04/24/24       Disciplines: Interdisciplinary, PROVIDER         Outcomes     Date/Time User Outcome    05/16/24 1003 Elizabeth Martin P Progressing    03/20/24 1105 Elizabeth Martin P Progressing            Goal note from Counselor 03/20/2024 by Elizabeth Jacquet B,  LCSW     03/20/24: 75% progress cites a few instances she became overwhelmed but not much.                     Goal: LTG: Pt reports think about these things in the past without having such an impact on my life.      Dates: Start:  11/25/23    Expected End:  04/24/24       Disciplines: Interdisciplinary, PROVIDER         Outcomes     Date/Time User Outcome    05/16/24 1003 Marria Mathison P Progressing    03/20/24 1105 Alberta Cairns P Progressing            Goal note from Counselor 05/16/2024 by Elizabeth Slot, LCSW     05/16/24: Patient reports she feels she is able to control duration and intensity of unwanted memories when they arise through use of coping skills and ability to ventilate her focus.              Goal note from Counselor 03/20/2024 by Elizabeth Jacquet B, LCSW     03/20/24: 60% progress I think I'm getting better at coping as they come up, but it's because I am able to handle them better.              ProgressTowards Goals: Progressing  Interventions: CBT, Assertiveness Training, Supportive, and Reframing  Summary:  Elizabeth Martin is a 63 y.o. female who presents with symptoms of depression and trauma. Patient identifies symptoms to include reexperiencing, uncontrollable worry, and hypervigilance. Pt was oriented times 5. Pt was cooperative and engaged. Pt denies SI/HI/AVH.  The patient utilized the therapeutic space to process her recent trip to the beach to visit her daughter. She reflected on the dual role the beach plays in her life, noting that it serves both as a trigger and as a safe place. The patient realized that the beach has often been an escape from her life at home during her childhood. She expressed the need to establish a process of mourning and identified a sense of closure regarding her relationship with the beach, indicating that she believes it is no longer a healthy place for her to visit.   The patient reflected on the changes in her ability to  cope with her trauma history and processed her improvements in accepting her physical conditions. She is working on reframing her negative thoughts, particularly the belief that I am helpless.  As she talked about her current relationship with her daughters, the patient became tearful, expressing confusion about the events that have led to their estrangement. The clinician and the patient discussed the factors she can control and identified ways for her to continue living her life while accepting her daughters' boundaries.   Suicidal/Homicidal: Nowithout intent/plan  Therapist Response: Clinician used active and supportive reflection to create a safe environment for patient to process recent life stressors. Clinician assessed for current symptoms, stressors, safety  since last session.  Processed progress with her ability to cope with her trauma symptoms as well as grief related to her current relationship with her daughters.  Plan: Return again in 4 weeks.  Diagnosis: Recurrent major depressive disorder, in partial remission (HCC)  GAD (generalized anxiety disorder)  PTSD (post-traumatic stress disorder)   Collaboration of Care: [facility] psychiatrist can access notes and cln. Will review psychiatrists' notes. Check in with the patient and will see LCSW per availability. Patient agreed with treatment recommendations.   Patient/Guardian was advised Release of Information must be obtained prior to any record release in order to collaborate their care with an outside provider. Patient/Guardian was advised if they have not already done so to contact the registration department to sign all necessary forms in order for us  to release information regarding their care.   Consent: Patient/Guardian gives verbal consent for treatment and assignment of benefits for services provided during this visit. Patient/Guardian expressed understanding and agreed to proceed.   Elizabeth Slot, LCSW 05/16/2024

## 2024-05-21 ENCOUNTER — Ambulatory Visit: Admitting: Occupational Therapy

## 2024-05-21 ENCOUNTER — Ambulatory Visit

## 2024-05-22 ENCOUNTER — Ambulatory Visit: Admitting: Occupational Therapy

## 2024-05-23 ENCOUNTER — Ambulatory Visit

## 2024-05-23 ENCOUNTER — Ambulatory Visit: Admitting: Occupational Therapy

## 2024-05-24 ENCOUNTER — Ambulatory Visit: Admitting: Occupational Therapy

## 2024-05-28 ENCOUNTER — Ambulatory Visit

## 2024-05-28 ENCOUNTER — Ambulatory Visit: Admitting: Occupational Therapy

## 2024-05-30 ENCOUNTER — Ambulatory Visit: Admitting: Occupational Therapy

## 2024-05-30 ENCOUNTER — Ambulatory Visit

## 2024-06-04 ENCOUNTER — Ambulatory Visit

## 2024-06-04 ENCOUNTER — Ambulatory Visit: Admitting: Occupational Therapy

## 2024-06-06 ENCOUNTER — Ambulatory Visit: Admitting: Occupational Therapy

## 2024-06-06 ENCOUNTER — Ambulatory Visit

## 2024-06-11 ENCOUNTER — Ambulatory Visit: Admitting: Occupational Therapy

## 2024-06-11 ENCOUNTER — Ambulatory Visit

## 2024-06-13 ENCOUNTER — Ambulatory Visit: Admitting: Occupational Therapy

## 2024-06-13 ENCOUNTER — Ambulatory Visit

## 2024-06-18 ENCOUNTER — Ambulatory Visit: Attending: Family Medicine | Admitting: Occupational Therapy

## 2024-06-18 ENCOUNTER — Ambulatory Visit

## 2024-06-18 DIAGNOSIS — I890 Lymphedema, not elsewhere classified: Secondary | ICD-10-CM | POA: Diagnosis present

## 2024-06-18 NOTE — Therapy (Signed)
 OUTPATIENT OCCUPATIONAL THERAPY TREATMENT NOTE  RIGHT LOWER EXTREMITY/ R LOWER QUADRANT LYMPHEDEMA  Patient Name: Elizabeth Martin MRN: [id number] DOB:1961-09-07, 63 y.o., female Today's Date: 06/18/2024   END OF SESSION:  Lymphedema Episode 2   OT End of Session - 06/18/24 1119     Visit Number 39    Number of Visits 72    Date for OT Re-Evaluation 07/17/24    OT Start Time 1107    OT Stop Time 1207    OT Time Calculation (min) 60 min    Activity Tolerance Patient tolerated treatment well;No increased pain    Behavior During Therapy WFL for tasks assessed/performed           Past Medical History:  Diagnosis Date   Anxiety    Asthma    Autoimmune disease (HCC)    Cancer (HCC)    Endometrial lymph node removal (30)   Corneal abrasion, right 05/24/2022   Depression    GERD (gastroesophageal reflux disease)    H/O blood clots    upper rt groin and behind both knees   History of hiatal hernia    Hypertension    Lymphedema    right leg   Morphea scleroderma    PONV (postoperative nausea and vomiting)    states gets violently ill   Past Surgical History:  Procedure Laterality Date   BASAL CELL CARCINOMA EXCISION Left    CHOLECYSTECTOMY  2008   FLEXIBLE SIGMOIDOSCOPY N/A 05/19/2022   Procedure: FLEXIBLE SIGMOIDOSCOPY;  Surgeon: Toledo, Ladell POUR, MD;  Location: [facility];  Service: Gastroenterology;  Laterality: N/A;   HERNIA REPAIR  2004   ILEOSTOMY CLOSURE N/A 08/24/2022   Procedure: ILEOSTOMY TAKEDOWN, open loop with Arthea Platt, PA-C to assist;  Surgeon: Desiderio Schanz, MD;  Location: [facility];  Service: General;  Laterality: N/A;   IVC FILTER INSERTION N/A 11/09/2023   Procedure: IVC FILTER INSERTION;  Surgeon: Marea Selinda RAMAN, MD;  Location: [facility];  Service: Cardiovascular;  Laterality: N/A;   IVC FILTER REMOVAL N/A 01/02/2024   Procedure: IVC FILTER REMOVAL;  Surgeon: Marea Selinda RAMAN, MD;  Location: [facility];  Service:  Cardiovascular;  Laterality: N/A;   PLANTAR FASCIECTOMY     ROBOTIC ASSISTED TOTAL HYSTERECTOMY WITH BILATERAL SALPINGO OOPHERECTOMY  02/14/2012   ROTATOR CUFF REPAIR Left 03/24/2022   TONSILLECTOMY     TOTAL HIP ARTHROPLASTY Right 11/14/2023   Procedure: TOTAL HIP ARTHROPLASTY - posterior;  Surgeon: Lorelle Arthea, MD;  Location: [facility];  Service: Orthopedics;  Laterality: Right;   XI ROBOTIC ASSISTED LOWER ANTERIOR RESECTION N/A 05/24/2022   Procedure: XI ROBOTIC ASSISTED LOWER ANTERIOR RESECTION;  Surgeon: Desiderio Schanz, MD;  Location: [facility];  Service: General;  Laterality: N/A;   Patient Active Problem List   Diagnosis Date Noted   Constipation 01/10/2024   Nausea & vomiting 01/10/2024   S/P total right hip arthroplasty 11/14/2023   Blood coagulation disorder (HCC) 11/10/2023   PTSD (post-traumatic stress disorder) 11/10/2023   Severe episode of recurrent major depressive disorder, without psychotic features (HCC) 11/10/2023   GAD (generalized anxiety disorder) 11/10/2023   High risk medication use 11/10/2023   Anxiety and depression 03/22/2023   Cervical radiculopathy 03/16/2023   Colon cancer screening 03/15/2023   Chronic idiopathic constipation 03/15/2023   Dyspnea 03/07/2023   [facility] spotted fever 03/07/2023   Strain of gastrocnemius tendon 03/07/2023   Lower abdominal pain 03/07/2023   Pain in pelvis 03/07/2023   Small bowel obstruction (HCC)  02/11/2023   Raynaud disease 02/11/2023   Polyarthralgia 02/11/2023   Edema 01/03/2023   Diverticular disease 12/10/2022   Hot flashes 12/10/2022   Pain of right lower extremity 12/10/2022   Paresthesia of lower extremity 12/10/2022   Primary insomnia 12/10/2022   Thyroid  nodule 12/10/2022   Multiple joint pain 12/10/2022   Basal cell carcinoma of skin 12/10/2022   Acute bilateral low back pain without sciatica 11/10/2022   Chronic lower back pain 09/05/2022   S/P closure of ileostomy 08/24/2022   Ileostomy in  place [facility])    Morphea 07/13/2022   Colonic stricture (HCC) 05/19/2022   Large bowel obstruction (HCC) 05/19/2022   Hypokalemia 05/18/2022   Abdominal pain 05/18/2022   Osteoarthritis of left knee 02/24/2022   Mixed connective tissue disease (HCC) 02/17/2022   Adhesive capsulitis of left shoulder 01/27/2022   Glenohumeral arthritis, left 01/27/2022   Microscopic hematuria 01/12/2022   Otorrhagia of right ear 12/10/2021   Elevated testosterone level in female 11/04/2021   Anti-TPO antibodies present 10/20/2021   Rash 10/13/2021   Hashimoto thyroiditis, 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   Hashimoto's disease 03/23/2021   Exposure to severe acute respiratory syndrome coronavirus 2 (SARS-CoV-2) 12/19/2020   Migraine without aura and without status migrainosus, not intractable 08/19/2020   Myofascial pain 07/30/2020   Neck pain 07/30/2020   Upper back pain 07/30/2020   Cervical facet joint syndrome 07/30/2020   Hair loss 07/14/2020   Edema of lower extremity 07/11/2020   Adenomatous colon polyp 02/26/2020   Gastroesophageal reflux disease 02/15/2020   Diarrhea 02/15/2020   Sleep disorder 01/16/2020   Hyperlipidemia 07/11/2019   Right shoulder pain 04/04/2019   Pre-operative clearance 12/13/2018   Endometrial cancer (HCC) 04/05/2018   Patellofemoral pain syndrome of right knee 03/20/2018   Gastroenteritis 12/07/2017   Fatigue 11/16/2017   Morbid obesity (HCC) 08/11/2017   Stucco keratoses 08/11/2017   Essential hypertension 08/09/2017   History of endometrial cancer 08/09/2017   Lymphedema 08/09/2017   Menopause 08/09/2017   Malignant neoplastic disease (HCC) 04/07/2017   Cellulitis 09/18/2016   Benign hypertension 06/02/2016   Asthma 06/04/1969    PCP: Glenwood Blanch, MD  REFERRING PROVIDER: Glenwood Blanch, MD  REFERRING DIAG: I89.0   THERAPY DIAG:  Lymphedema, not elsewhere classified  Rationale for Evaluation and Treatment: Rehabilitation  ONSET DATE: 2013   (Cancer-related, endometrial 2013)  SUBJECTIVE:                                                                                                                                                                                           SUBJECTIVE STATEMENT:Patient returns to OT for lymphedema care to  RLE/RLQ after vacation to [place] . Pt was last seen on 05/16/24.   Pt rates LE-related pain at 1/10 today, stating steroids have been very helpful for pain relief. Pt reports she received custom garments , but is unable to get the [employer] on without exceptional effort. She is unable to use them in the current condition.  Pt reports she did receive replacement garments for her Flexitouch sequential pneumatic device and has been able to resume using it regularly.  PERTINENT HISTORY: Asthma, Autoimmune Disease (Connective Tissue Disease), Endometrial Ca w/ LND ( 30 bilateral pelvic and periaortic LN), adjuvant chemotherapy and XRT,   H/O blood clots, RLE/RLQ lymphedema, Robotic assisted total hysterectomy w/ bilateral oophorectomy L Rotator Cuff repair, S/P ileostomy 2/2 colon stricture 05/2022; s/p R hip arthroplasty 11/11/23, scleroderma morphia bilateral groin  PAIN:  Are you having pain? YES. See subjective; 1/10 Pain location: RLE, R hip, cervical spine Pain description: sore, aching, heavy, full, tight,  Aggravating factors: standing, walking, extended dependent sitting Relieving factors: elevation, movement  PRECAUTIONS: Fall and Other: LYMPHEDEMA  WEIGHT BEARING RESTRICTIONS: Yes 5# lifting restriction- shoulder  FALLS:  Has patient fallen in last 6 months? No  LIVING ENVIRONMENT: Lives with: lives with their spouse Lives in: House/apartment Stairs: No;  Has following equipment at home: Single point cane, Environmental consultant - 2 wheeled, Environmental consultant - 4 wheeled, and Wheelchair (manual)  OCCUPATION: retired Control and instrumentation engineer: gardening, hiking, biking, dancing- unable to participate in any of these  due to pain and swelling  HAND DOMINANCE: right   PRIOR LEVEL OF FUNCTION: Independent with basic ADLs, Independent with household mobility without device, Independent with community mobility without device, Requires assistive device for independence, Needs assistance with homemaking, Needs assistance with transfers, and Leisure: decreased  social participation for leisure pursuits due to impaired mobility and pain  PATIENT GOALS:  Be able to move freely without pain Reduce limb volume to be able to lift limb for basic daily ADLs and functional ambulation Reduce limb volume to increase ability to perform lower body dressing and bathing and grooming Reduce limb to increase body image  OBJECTIVE: Note: Objective measures were completed at Evaluation unless otherwise noted.  COGNITION:  Overall cognitive status: Within functional limits for tasks assessed   OBSERVATIONS / OTHER ASSESSMENTS:   POSTURE: WFL  LE ROM: WFL, but Limited mildly at R knee and ankle due to girth, skin approximation, and joint pain  LE MMT: WFL  Mild, Stage  II, Bilateral Lower Extremity Lymphedema 2/2 CVI and Obesity  Skin  Description Hyper-Keratosis Peau d' Orange Shiny Tight Fibrotic/ Indurated Fatty Doughy Spongy/ boggy   x x x x R>L Severe morphea at  R groin obstructing lymphatics and skin excursion   x   Skin dry Flaky WNL Macerated   mildly sclerotic     Color Redness Varicosities Blanching Hemosiderin Stain Mottled   x x x   x   Odor Malodorous Yeast Fungal infection  WNL      x   Temperature Warm Cool wnl    x     Pitting Edema   1+ 2+ 3+ 4+ Non-pitting         x   Girth Symmetrical Asymmetrical                   Distribution    R>L RLE toes to groin    Stemmer Sign Positive Negative   +    Lymphorrhea History Of:  Present Absent  x    Wounds History Of Present Absent Venous Arterial Pressure Sheer     x        Signs of Infection Redness Warmth Erythema Acute  Swelling Drainage Borders                    Sensation Light Touch Deep pressure Hypersensitivity   In tact Impaired In tact Impaired Absent Impaired   x  x  x     Nails WNL   Fungus nail dystrophy   x     Hair Growth Symmetrical Asymmetrical    R>L   Skin Creases Base of toes  Ankles   Base of Fingers knees       Abdominal pannus Thigh Lobules  Face/neck   x x  x        BLE COMPARATIVE LIMB VOLUMETRICS 10/10/23  LANDMARK RIGHT  10/10/23  R LEG (A-D) 5466.5 ml  R THIGH (E-G) 9186.6 ml  R FULL LIMB (A-G) 14653.1 ml  Limb Volume differential (LVD)  LVD for LEG = 44.1%, R>L LVD for THIGH= 33.98%, R>L LVD for FULL lower extremity 37.8%, R>L  Volume change since last 08/11/22 R LEG is INCREASED in volume by 59.6%. L THIGH is INCREASED by 38 %, and RLE full limb is INCREASED by 45.38%.  Volume change overall V  (Blank rows = not tested)  LANDMARK LEFT  10/12/23  L LEG (A-D) 3053.6 ml  L THIGH (E-G) 6064.8 ml  L  FULL LIMB (A-G) 9118.4 ml  Limb Volume differential (LVD)  %  Volume change since initial %  Volume change overall %  (Blank rows = not tested)   10 th VISIT PROGRESS NOTE RLE COMPARATIVE LIMB VOLUMETRICS 01/04/24: Deferred until next visit by Pt request.  LANDMARK RIGHT    R LEG (A-D) TBA  R THIGH (E-G) TBA  R FULL LIMB (A-G) TBA  Limb Volume differential (LVD)    Volume change since last 08/11/22 TBA  Volume change overall TBA  (Blank rows = not tested)  20 th VISIT PROGRESS NOTE RLE COMPARATIVE LIMB VOLUMETRICS TBA next session  LANDMARK RIGHT    R LEG (A-D) 6005.7 ml  R THIGH (E-G) 9400.3 ml  R FULL LIMB (A-G) 15406.02 ml  Limb Volume differential (LVD)    Volume change since last measured on 10/10/23 R LEG is INCREASED in volume by 9.9%. L THIGH is INCREASED by 2.32 %, and RLE full limb is INCREASED by 5.13% since last measured on 10/10/23.  Volume change since commencing OT for CDT %    28 th VISIT PROGRESS NOTE RLE COMPARATIVE LIMB VOLUMETRICS TBA  next session  LANDMARK RIGHT    R LEG (A-D) 5936.1 ml  R THIGH (E-G) 8538.6  ml  R FULL LIMB (A-G) 14524.7  ml  Limb Volume differential (LVD)    Volume change since last measured on 10/10/23 R LEG is DECREASED in volume by 1.2 %.  Since last measured on 03/14/24. R THIGH is DECREASED by 8.6 %, and RLE full limb is DECREASED by 5.72 % since last measured on 03/14/24.  Volume change since commencing OT for CDT %   ENDOMETRIAL CA-related LYMPHEDEMA Hx:  SURGERY TYPE/DATE: 2013 NUMBER OF LYMPH NODES REMOVED: 30 by report- bilateral pelvic and periaortic CHEMOTHERAPY: yes RADIATION:yes INFECTIONS: no hx cellulitis  LYMPHEDEMA LIFE IMPACT SCALE (LLIS): Intake 10/12/23 75% (The extent to which lymphedema related problems impacted your life over the past week)  FOTO (functional outcome measure):10/10/23 INTAKE:  36% No Out take score. FOTO assessment discontinued by clinic.  PATIENT EDUCATION:  Education details: Continued Pt/ CG edu for lymphedema self care home program throughout session. Topics include outcome of comparative limb volumetrics- starting limb volume differentials (LVDs), technology and gradient techniques used for short stretch, multilayer compression wrapping, simple self-MLD, therapeutic lymphatic pumping exercises, skin/nail care, LE precautions,. compression garment recommendations and specifications, wear and care schedule and compression garment donning / doffing w assistive devices. Discussed progress towards all OT goals since commencing CDT. All questions answered to the Pt's satisfaction. Good return. Person educated: Patient Education method: Explanation, Demonstration, and Handouts Education comprehension: verbalized understanding, returned demonstration, and needs further education  LE SELF-CARE HOME  PROGRAM: Simple self-MLD/daily to affected quadrant and body part At least 2 x daily BLE Lymphatic Pumping There ex 1 set of 10 reps each, in order, bilaterally Daily skin  care to affected body part to limit infection risk and increase skin excursion Compression Bandaging Intensive stage compression: multilayer short stretch wraps with gradient techniques. One limb at a time. Length patient dependent. Self-management Phase: Appropriate daytime compression garment and hours-of-sleep device  Compression garments: Custom-made gradient compression garments and hours-of-sleep devices are medically necessary because they are uniquely sized and shaped to fit the exact dimensions of the affected extremities and to provide accurate and consistent gradient compression and containment, essential  for optimally managing chronic, progressive lymphedema. The convoluted HOS devices are medically necessary to facilitate increased lymphatic circulation and limit fibrosis formation when sleeping. Multiple custom compression garments are needed for optimal hygiene to limit infection risk. Custom compression garments should be replaced q 3-6 months When worn consistently for optimal lipo-lymphedema self-management over time.  ASSESSMENT:  CLINICAL IMPRESSION:    Pt last seen a little over a month ago before she left on vacation in [place] . Unfortunately the comp'd custom Elvarex compression garments did not fit and Pt was unable to use them on her trip. Pt used instead ccl 2 , off the shelf, circular knit, Juzo, chaps style, compression garments daily. These garments are inadequate for controlling dense swelling and RLE is quite swollen and densely congested today. We resumed MLD and included fibrosis techniques distally. Reapplied compression wraps as established. No visible or palpable change observed in swelling or tissue density after manual therapy. OT to email manufacturer's rep and ask if we can get remakes for comp'd garments. Cont as per POC 2-3 x weekly ongoing to support management of severe, stage II, RLE/RLQ lymphedema.   10/10/23 Lymphedema Episode 2, Initial OT Evaluation  : Elizabeth Martin is a 57 y a female presenting with chronic, progressive, moderate, Stage II, RLE/RLQ cancer -related lymphedema with onset 11 years ago after Rx for endometrial cancer. Pt is well known to this therapist as she has successfully undergone Complete Decongestive Therapy (CDT)  this clinic bin the past. Pt reports recent exacerbation of RLE swelling worsened as inflammation in L hip has worsened over time.  Pt has a hip replacement scheduled in late December here it [facility]. RE limb volumetrics today reveal that R LEG volume is dramatically increased by 59.65 since last visit. R LEG VOLUME is INCREASED in volume by 59.6%. L THIGH is INCREASED by 38 %, and RLE full limb is INCREASED by 45.38%. Pt presents with LE-related skin changes. Prolonged inflammation in the R hip joint has likely overloaded  already RLE/RLQ lymphatics.  RLE/RLQ lymphedema limits Pt's ability to perform basic and instrumental ADLs, including functional ambulation, mobility and transfers, grooming ,  lower body bathing and dressing, skin inspection and skin care. Pt has difficulty  reaching her lower legs and feet to apply compression wraps and don/doff        compression stockings. LE limits ability to P[perform instrumental ADLs, including driving, yard work and home management activities. Lymphedema limits her ability to participate in leisure pursuits and productive activities, and it negatively impacts body image, life roles and quality of life.   Pt will benefit from an Intensive and follow along course of lymphedema. CDT will consist of Manual Lymphatic gainage   (MLD), skin care, therapeutic exercise and compression, wraps initially then garments. Pt will need assistance throughout CDT   for applying compression wraps due to limited hip AROM and decreased skin flexibility. Without skilled Occupational Therapy for lymphedema care, lymphedema will progress and further functional decline is expected.   In prep for upcoming  hip replacement OT will educate Pt re assistive devices, including tub transfer bench and elevated toilet seat, in keeping with hip precautions.  OBJECTIVE IMPAIRMENTS: Abnormal gait, decreased activity tolerance, decreased balance, decreased knowledge of use of DME, decreased mobility, difficulty walking, decreased ROM, decreased strength, increased edema, decreased skin flexibility, increased fascial restrictions, impaired sensation, pain, and chronic, progressive LLE/LLQ lymphatic swelling and associated pain.   ADL LIMITATIONS: carrying, lifting, bending, sitting, standing, squatting, sleeping, stairs, transfers, bed mobility, bathing, dressing, hygiene/grooming, and productive activities, leisure pursuits, social participation, body image driving, shopping, housework, yard work, cooking, meal prep  PERSONAL FACTORS: Past/current experiences and 3+ comorbidities: Morphea Scleroderma, Mixed connective tissue disease, and osteoarthritis are also affecting patient's functional outcome.   REHAB POTENTIAL: Good  EVALUATION COMPLEXITY: Moderate  GOALS: Goals reviewed with patient? Yes  SHORT TERM GOALS: Target date: 4th OT Rx visit   Pt will demonstrate understanding of lymphedema precautions and prevention strategies with modified independence using a printed reference to identify at least 5 precautions and discussing how s/he may implement them into daily life to reduce risk of progression with extra time.  Baseline:Max A Goal status: GOAL MET  2.  Pt will be able to apply multilayer, knee length, gradient, compression wraps to one leg at a time with modified assistance (extra time and assistive device/s) to decrease limb volume, to limit infection risk, and to limit lymphedema progression.  Baseline: Dependent Goal status: GOAL MET  LONG TERM GOALS: Target date: 01/09/24 (12 weeks)  Given this patient's Intake score 36% on the functional outcomes FOTO tool, patient will experience an  increase in function of 3 points to improve basic and instrumental ADLs performance, including lymphedema self-care.  Baseline: Max A Goal status: DEFERRED as LORALIE has been discontinued  2.  Given this patient's Intake score of  75% on the Lymphedema Life Impact Scale (LLIS), patient will experience a reduction of at least 5 points in her perceived level of functional impairment resulting from lymphedema to improve functional performance and quality of life (QOL). Baseline: % Goal status: PROGRESSING  3.  Pt will achieve at least a 10% volume reduction in full, RLE limb volume to return limb to typical size and shape, to limit infection risk and LE progression, to decrease pain, to improve function. Baseline: Dependent Goal status:PROGRESSING. See chart   4.  Pt will obtain proper compression garments/devices and achieve modified independence (extra time + assistive devices) with donning/doffing to optimize limb volume reductions and limit LE  progression over time. Baseline:  Goal status: 01/04/24: PARTIALLY MET; In an effort to reduce burden of care on  her spouse, Pt obtained adjustable, Velcro style R full leg, Circaid leg reduction kit for use s/p THA, but this proved not effective for controlling swelling, and were not easier to don and doff more independently. We've resumed wrapping until she is able to fit back into custom compression garments. 02/14/24: Ongoing 04/11/24: Submitted new garment measurements to DME vendor for 3 piece system to address worsening lymphedema. Pt's insurance company denied coverage. She in the process of appealing.  5.  During Intensive phase CDT , with modified independence, Pt will achieve at least 85% compliance with all lymphedema self-care home program components, including daily skin care, compression wraps and /or garments, simple self MLD and lymphatic pumping therex to habituate LE self care protocol  into ADLs for optimal LE self-management over  time. Baseline: Dependent Goal status: 02/14/24: GOAL MET and EXCEEDED PLAN:  OT FREQUENCY: 2-3 x/week  OT DURATION: 12 weeks and PRN  PLANNED INTERVENTIONS: Complete Decongestive Therapy (Intensive and supported Self-Management Phases), 97110-Therapeutic exercises, 97530- Therapeutic activity, 97535- Self Care, [id number]- Manual therapy, Patient/Family education, Taping, Manual lymph drainage, Scar mobilization, Compression bandaging, DME instructions, and skin care to reduce infection risk throughout manual therapy. Fit with replacement compression garments ASAP  PLAN FOR NEXT SESSION:  Cont MLD as established Cont multilayer compression wrapping   Zebedee Dec, MS, OTR/L, CLT-LANA 06/18/24 2:26 PM

## 2024-06-19 ENCOUNTER — Encounter: Payer: Self-pay | Admitting: Psychiatry

## 2024-06-19 ENCOUNTER — Telehealth (INDEPENDENT_AMBULATORY_CARE_PROVIDER_SITE_OTHER): Admitting: Psychiatry

## 2024-06-19 DIAGNOSIS — F411 Generalized anxiety disorder: Secondary | ICD-10-CM | POA: Diagnosis not present

## 2024-06-19 DIAGNOSIS — F4310 Post-traumatic stress disorder, unspecified: Secondary | ICD-10-CM

## 2024-06-19 DIAGNOSIS — F3341 Major depressive disorder, recurrent, in partial remission: Secondary | ICD-10-CM | POA: Diagnosis not present

## 2024-06-19 MED ORDER — VENLAFAXINE HCL ER 75 MG PO CP24: 75.0000 mg | ORAL_CAPSULE | Freq: Every day | ORAL | 0 refills | Status: DC

## 2024-06-19 NOTE — Progress Notes (Signed)
 Virtual Visit via Video Note  I connected with Elizabeth Martin on 06/19/24 at  1:00 PM EDT by a video enabled telemedicine application and verified that I am speaking with the correct person using two identifiers.  Location Provider Location : [facility] Patient Location : Home  Participants: Patient , Provider    I discussed the limitations of evaluation and management by telemedicine and the availability of in person appointments. The patient expressed understanding and agreed to proceed.   I discussed the assessment and treatment plan with the patient. The patient was provided an opportunity to ask questions and all were answered. The patient agreed with the plan and demonstrated an understanding of the instructions.   The patient was advised to call back or seek an in-person evaluation if the symptoms worsen or if the condition fails to improve as anticipated.   BH MD OP Progress Note  06/19/2024 1:30 PM Elizabeth Martin  MRN:  [id number]  Chief Complaint: No chief complaint on file.  Discussed the use of AI scribe software for clinical note transcription with the patient, who gave verbal consent to proceed.  History of Present Illness Elizabeth Martin is a 63 year old Caucasian female, retired, lives in [place], married, has a history of MDD, GAD, PTSD, mixup connective-tissue disease, ANA positive, long-term use of immunosuppressant medication, history of endometrial cancer, chronic pain status post hip surgery was evaluated by telemedicine today.  Her PTSD symptoms have improved with Eye Movement Desensitization and Reprocessing (EMDR) therapy, although it initially exacerbated her worrying and racing thoughts. She is currently seeing her therapist once a month and finds EMDR to be a beneficial tool.  Her anxiety and depression are now more manageable. She denies any current concerns. She is currently taking venlafaxine  (Effexor ), which was started after tapering off  Cymbalta . She reports no side effects from venlafaxine  and is tolerating it well. She is also taking Buspar  10 mg twice a day, Wellbutrin  150 mg, and trazodone  50 mg at bedtime as needed.  She had a recent urinary tract infection (UTI) and was prescribed Keflex  for a few days.  She reports sleeping well and states that her depression and anxiety are more manageable now.  Denies thoughts of self-harm or harm to others.   Visit Diagnosis:    ICD-10-CM   1. Recurrent major depressive disorder, in partial remission (HCC)  F33.41 venlafaxine  XR (EFFEXOR -XR) 75 MG 24 hr capsule    2. PTSD (post-traumatic stress disorder)  F43.10 venlafaxine  XR (EFFEXOR -XR) 75 MG 24 hr capsule    3. GAD (generalized anxiety disorder)  F41.1 venlafaxine  XR (EFFEXOR -XR) 75 MG 24 hr capsule      Past Psychiatric History: I have reviewed past psychiatric history from progress note on 11/10/2023.  Past trials of Cymbalta .  Past Medical History:  Past Medical History:  Diagnosis Date   Anxiety    Asthma    Autoimmune disease (HCC)    Cancer (HCC)    Endometrial lymph node removal (30)   Corneal abrasion, right 05/24/2022   Depression    GERD (gastroesophageal reflux disease)    H/O blood clots    upper rt groin and behind both knees   History of hiatal hernia    Hypertension    Lymphedema    right leg   Morphea scleroderma    PONV (postoperative nausea and vomiting)    states gets violently ill    Past Surgical History:  Procedure Laterality Date   BASAL CELL CARCINOMA EXCISION  Left    CHOLECYSTECTOMY  2008   FLEXIBLE SIGMOIDOSCOPY N/A 05/19/2022   Procedure: FLEXIBLE SIGMOIDOSCOPY;  Surgeon: Toledo, Ladell POUR, MD;  Location: [facility];  Service: Gastroenterology;  Laterality: N/A;   HERNIA REPAIR  2004   ILEOSTOMY CLOSURE N/A 08/24/2022   Procedure: ILEOSTOMY TAKEDOWN, open loop with Arthea Platt, PA-C to assist;  Surgeon: Desiderio Schanz, MD;  Location: [facility] ORS;  Service: General;   Laterality: N/A;   IVC FILTER INSERTION N/A 11/09/2023   Procedure: IVC FILTER INSERTION;  Surgeon: Marea Selinda RAMAN, MD;  Location: [facility];  Service: Cardiovascular;  Laterality: N/A;   IVC FILTER REMOVAL N/A 01/02/2024   Procedure: IVC FILTER REMOVAL;  Surgeon: Marea Selinda RAMAN, MD;  Location: [facility];  Service: Cardiovascular;  Laterality: N/A;   PLANTAR FASCIECTOMY     ROBOTIC ASSISTED TOTAL HYSTERECTOMY WITH BILATERAL SALPINGO OOPHERECTOMY  02/14/2012   ROTATOR CUFF REPAIR Left 03/24/2022   TONSILLECTOMY     TOTAL HIP ARTHROPLASTY Right 11/14/2023   Procedure: TOTAL HIP ARTHROPLASTY - posterior;  Surgeon: Lorelle Arthea, MD;  Location: [facility] ORS;  Service: Orthopedics;  Laterality: Right;   XI ROBOTIC ASSISTED LOWER ANTERIOR RESECTION N/A 05/24/2022   Procedure: XI ROBOTIC ASSISTED LOWER ANTERIOR RESECTION;  Surgeon: Desiderio Schanz, MD;  Location: [facility] OR[facility];  Service: General;  Laterality: N/A;    Family Psychiatric History: I have reviewed family psychiatric history from progress note on 11/10/2023.  Family History:  Family History  Problem Relation Age of Onset   Mental illness Neg Hx     Social History: I have reviewed social history from progress note on 11/10/2023. Social History   Socioeconomic History   Marital status: Married    Spouse name: Not on file   Number of children: Not on file   Years of education: Not on file   Highest education level: Not on file  Occupational History   Not on file  Tobacco Use   Smoking status: Never   Smokeless tobacco: Never  Vaping Use   Vaping status: Never Used  Substance and Sexual Activity   Alcohol use: Yes   Drug use: Never   Sexual activity: Not Currently  Other Topics Concern   Not on file  Social History Narrative   ** Merged History Encounter **       Social Drivers of Health   Financial Resource Strain: Low Risk  (08/20/2023)   Received from [facility]   Overall Financial Resource Strain  (CARDIA)    Difficulty of Paying Living Expenses: Not hard at all  Food Insecurity: No Food Insecurity (11/14/2023)   Hunger Vital Sign    Worried About Running Out of Food in the Last Year: Never true    Ran Out of Food in the Last Year: Never true  Transportation Needs: No Transportation Needs (11/14/2023)   PRAPARE - Administrator, Civil Service (Medical): No    Lack of Transportation (Non-Medical): No  Physical Activity: Not on file  Stress: Not on file  Social Connections: Unknown (02/01/2023)   Received [facility]   Social Network    Social Network: Not on file    Allergies:  Allergies  Allergen Reactions   Carboplatin Anaphylaxis        Hydromorphone  Anaphylaxis    Stopped breathing  Respiratory arrest - due to dosage administered per the patient.   Latex Hives, Itching and Rash   Nitrofurantoin Nausea And Vomiting   Silicone Itching and  Rash   Codeine Nausea And Vomiting   Other Nausea And Vomiting    Any codone medications Violently sick even with zofran  per patient report    Wheat Other (See Comments)    Body aches and swelling    Oxycodone  Nausea And Vomiting    Metabolic Disorder Labs: No results found for: HGBA1C, MPG No results found for: PROLACTIN No results found for: CHOL, TRIG, HDL, CHOLHDL, VLDL, LDLCALC Lab Results  Component Value Date   TSH 2.066 12/21/2023    Therapeutic Level Labs: No results found for: LITHIUM No results found for: VALPROATE No results found for: CBMZ  Current Medications: Current Outpatient Medications  Medication Sig Dispense Refill   cephALEXin  (KEFLEX ) 500 MG capsule Take 500 mg by mouth 2 (two) times daily.     phenazopyridine (PYRIDIUM) 100 MG tablet Take 100 mg by mouth 3 (three) times daily as needed.     pramoxine (SARNA SENSITIVE) 1 % LOTN Apply topically.     scopolamine  (TRANSDERM-SCOP) 1 MG/3DAYS Place 1 mg onto the skin.     venlafaxine  XR (EFFEXOR -XR) 75  MG 24 hr capsule Take 1 capsule (75 mg total) by mouth daily with breakfast. 90 capsule 0   acetaminophen  (TYLENOL ) 500 MG tablet Take 2 tablets (1,000 mg total) by mouth every 8 (eight) hours. 30 tablet 0   albuterol  (VENTOLIN  HFA) 108 (90 Base) MCG/ACT inhaler Inhale 2 puffs into the lungs every 6 (six) hours as needed for shortness of breath or wheezing.     Alum Hydroxide-Mag Carbonate (GAVISCON PO) Take 4 tablets by mouth daily as needed (upset stomach).     budesonide-formoterol (SYMBICORT) 80-4.5 MCG/ACT inhaler Inhale 2 puffs into the lungs every 4 (four) hours as needed (Shortness of breath).     buPROPion  (WELLBUTRIN  XL) 150 MG 24 hr tablet TAKE 1 TABLET (150 MG TOTAL) BY MOUTH IN THE MORNING 90 tablet 0   busPIRone  (BUSPAR ) 10 MG tablet Take 10 mg by mouth 2 (two) times daily.     celecoxib  (CELEBREX ) 200 MG capsule Take 200 mg by mouth.     cetirizine (ZYRTEC) 10 MG tablet Take 10 mg by mouth daily.     CLOBETASOL PROPIONATE E 0.05 % emollient cream Apply topically.     desonide (DESOWEN) 0.05 % cream Apply 1 Application topically 2 (two) times daily as needed (morphea flare).     famotidine -calcium carbonate-magnesium  hydroxide (PEPCID  COMPLETE) 10-800-165 MG chewable tablet Chew 1 tablet by mouth daily as needed (heartburn).     folic acid (FOLVITE) 1 MG tablet Take 1 mg by mouth daily.     hydrocortisone-pramoxine (ANALPRAM-HC) 2.5-1 % rectal cream Place 1 Application rectally 3 (three) times daily as needed for hemorrhoids.     hydroxychloroquine  (PLAQUENIL ) 200 MG tablet Take 200 mg by mouth 2 (two) times daily.     lisinopril  (ZESTRIL ) 40 MG tablet Take 40 mg by mouth daily.     lubiprostone (AMITIZA) 24 MCG capsule Take by mouth.     methotrexate (RHEUMATREX) 2.5 MG tablet Take 10 mg by mouth 2 (two) times a week. Take 10 mg on Monday and Tuesday     omeprazole (PRILOSEC) 40 MG capsule Take 40 mg by mouth daily.     ondansetron  (ZOFRAN -ODT) 4 MG disintegrating tablet Take 4 mg  by mouth every 8 (eight) hours as needed for nausea or vomiting.     polyethylene glycol (MIRALAX  / GLYCOLAX ) 17 g packet Take 17 g by mouth.     promethazine  (PHENERGAN )  12.5 MG tablet Take 12.5 mg by mouth every 8 (eight) hours as needed.     silver sulfADIAZINE (SILVADENE) 1 % cream Apply 1 Application topically daily.     traZODone  (DESYREL ) 50 MG tablet TAKE 1 TABLET BY MOUTH AT BEDTIME AS NEEDED FOR SLEEP. 90 tablet 0   triamcinolone (KENALOG) 0.025 % cream Apply 1 Application topically 2 (two) times daily as needed (morphea flare).     trospium  (SANCTURA ) 20 MG tablet Take 1 tablet (20 mg total) by mouth 2 (two) times daily as needed (overactive bladder). 180 tablet 1   No current facility-administered medications for this visit.     Musculoskeletal: Strength & Muscle Tone: UTA Gait & Station: Seated Patient leans: N/A  Psychiatric Specialty Exam: Review of Systems  Psychiatric/Behavioral:  The patient is nervous/anxious.   BP - 128 /68 , HR - 86  There were no vitals taken for this visit.There is no height or weight on file to calculate BMI.  General Appearance: Casual  Eye Contact:  Fair  Speech:  Clear and Coherent  Volume:  Normal  Mood:  Anxious  Affect:  Congruent  Thought Process:  Goal Directed and Descriptions of Associations: Intact  Orientation:  Full (Time, Place, and Person)  Thought Content: Logical   Suicidal Thoughts:  No  Homicidal Thoughts:  No  Memory:  Immediate;   Fair Recent;   Fair Remote;   Fair  Judgement:  Fair  Insight:  Fair  Psychomotor Activity:  Normal  Concentration:  Concentration: Fair and Attention Span: Fair  Recall:  [employer] of Knowledge: Fair  Language: Fair  Akathisia:  No  Handed:  Right  AIMS (if indicated): not done  Assets:  Manufacturing systems engineer Desire for Improvement Housing Social Support Transportation  ADL's:  Intact  Cognition: WNL  Sleep:  Fair   Screenings: GAD-7    Garment/textile technologist Visit from  04/04/2024 in [facility] Counselor from 03/20/2024 in [facility] Counselor from 11/25/2023 in [facility] Office Visit from 11/10/2023 in [facility]  Total GAD-7 Score 7 3 20 17    PHQ2-9    Flowsheet Row Office Visit from 04/04/2024 in [facility] Counselor from 03/20/2024 in [facility] Counselor from 11/25/2023 in [facility] Office Visit from 11/10/2023 in [facility]  PHQ-2 Total Score 1 2 6 5   PHQ-9 Total Score 10 5 23 21    Flowsheet Row Office Visit from 04/04/2024 in [facility] Counselor from 03/20/2024 in [facility] Video Visit from 01/31/2024 in [facility]  C-SSRS RISK CATEGORY No Risk No Risk No Risk     Assessment and Plan: Nyhla Mountjoy is a 63 year old Caucasian female with history of depression, anxiety was evaluated by telemedicine today.  Discussed assessment and plan as noted below.  Generalized anxiety disorder-improving PTSD-improving MDD-in partial remission Currently anxiety symptoms have improved on the current combination of medications.  Was able to successfully taper off the Cymbalta  and is currently on a low dosage of venlafaxine .  Currently in EMDR therapy with Ms. Evalene Husband.  Motivated to stay in therapy.  Trauma related symptoms have also improved.  However is interested in dosage increase of venlafaxine  for residual symptoms. Increase venlafaxine  extended release to 75 mg daily Reduce  BuSpar  to 10 mg daily in divided dosage Continue trazodone  50 mg at bedtime Continue Wellbutrin  150 mg daily Continue  psychotherapy sessions with Ms. Evalene Husband.  Follow-up Follow-up in clinic in 4 to 5 weeks or sooner if needed.   Collaboration of Care: Collaboration of Care: Referral or follow-up with counselor/therapist AEB encouraged to continue psychotherapy sessions.  Patient/Guardian was advised Release of Information must be obtained prior to any record release in order to collaborate their care with an outside provider. Patient/Guardian was advised if they have not already done so to contact the registration department to sign all necessary forms in order for us  to release information regarding their care.   Consent: Patient/Guardian gives verbal consent for treatment and assignment of benefits for services provided during this visit. Patient/Guardian expressed understanding and agreed to proceed.    Chesnee Floren, MD 06/19/2024, 1:30 PM

## 2024-06-20 ENCOUNTER — Ambulatory Visit

## 2024-06-20 ENCOUNTER — Ambulatory Visit: Admitting: Occupational Therapy

## 2024-06-20 DIAGNOSIS — I890 Lymphedema, not elsewhere classified: Secondary | ICD-10-CM

## 2024-06-20 MED ORDER — BUSPIRONE HCL 10 MG PO TABS: 10.0000 mg | ORAL_TABLET | Freq: Every day | ORAL | Status: DC

## 2024-06-21 ENCOUNTER — Encounter: Payer: Self-pay | Admitting: Occupational Therapy

## 2024-06-21 ENCOUNTER — Ambulatory Visit (INDEPENDENT_AMBULATORY_CARE_PROVIDER_SITE_OTHER): Admitting: Licensed Clinical Social Worker

## 2024-06-21 DIAGNOSIS — F4310 Post-traumatic stress disorder, unspecified: Secondary | ICD-10-CM | POA: Diagnosis not present

## 2024-06-21 DIAGNOSIS — F3341 Major depressive disorder, recurrent, in partial remission: Secondary | ICD-10-CM | POA: Diagnosis not present

## 2024-06-21 DIAGNOSIS — F411 Generalized anxiety disorder: Secondary | ICD-10-CM | POA: Diagnosis not present
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
## 2024-06-21 NOTE — Therapy (Signed)
 OUTPATIENT OCCUPATIONAL THERAPY TREATMENT NOTE AND PROGRESS REPORT  RIGHT LOWER EXTREMITY/ R LOWER QUADRANT LYMPHEDEMA  Patient Name: Elizabeth Martin MRN: [id number] DOB:08-26-1961, 63 y.o., female Today's Date: 06/21/2024  REPORTING PERIOD: 04/16/24 - 06/20/24  END OF SESSION:  Lymphedema Episode 2   OT End of Session - 06/20/24 0940     Visit Number 40    Number of Visits 72    Date for OT Re-Evaluation 07/17/24    OT Start Time 1105    OT Stop Time 1210    OT Time Calculation (min) 65 min    Activity Tolerance Patient tolerated treatment well;No increased pain;Patient limited by pain    Behavior During Therapy [facility] for tasks assessed/performed           Past Medical History:  Diagnosis Date   Anxiety    Asthma    Autoimmune disease (HCC)    Cancer (HCC)    Endometrial lymph node removal (30)   Corneal abrasion, right 05/24/2022   Depression    GERD (gastroesophageal reflux disease)    H/O blood clots    upper rt groin and behind both knees   History of hiatal hernia    Hypertension    Lymphedema    right leg   Morphea scleroderma    PONV (postoperative nausea and vomiting)    states gets violently ill   Past Surgical History:  Procedure Laterality Date   BASAL CELL CARCINOMA EXCISION Left    CHOLECYSTECTOMY  2008   FLEXIBLE SIGMOIDOSCOPY N/A 05/19/2022   Procedure: FLEXIBLE SIGMOIDOSCOPY;  Surgeon: Toledo, Ladell POUR, MD;  Location: [facility] ENDOSCOPY;  Service: Gastroenterology;  Laterality: N/A;   HERNIA REPAIR  2004   ILEOSTOMY CLOSURE N/A 08/24/2022   Procedure: ILEOSTOMY TAKEDOWN, open loop with Arthea Platt, PA-C to assist;  Surgeon: Desiderio Schanz, MD;  Location: [facility] ORS;  Service: General;  Laterality: N/A;   IVC FILTER INSERTION N/A 11/09/2023   Procedure: IVC FILTER INSERTION;  Surgeon: Marea Selinda RAMAN, MD;  Location: [facility] INVASIVE CV LAB;  Service: Cardiovascular;  Laterality: N/A;   IVC FILTER REMOVAL N/A 01/02/2024   Procedure: IVC FILTER REMOVAL;   Surgeon: Marea Selinda RAMAN, MD;  Location: [facility] INVASIVE CV LAB;  Service: Cardiovascular;  Laterality: N/A;   PLANTAR FASCIECTOMY     ROBOTIC ASSISTED TOTAL HYSTERECTOMY WITH BILATERAL SALPINGO OOPHERECTOMY  02/14/2012   ROTATOR CUFF REPAIR Left 03/24/2022   TONSILLECTOMY     TOTAL HIP ARTHROPLASTY Right 11/14/2023   Procedure: TOTAL HIP ARTHROPLASTY - posterior;  Surgeon: Lorelle Arthea, MD;  Location: [facility] ORS;  Service: Orthopedics;  Laterality: Right;   XI ROBOTIC ASSISTED LOWER ANTERIOR RESECTION N/A 05/24/2022   Procedure: XI ROBOTIC ASSISTED LOWER ANTERIOR RESECTION;  Surgeon: Desiderio Schanz, MD;  Location: [facility] ORS;  Service: General;  Laterality: N/A;   Patient Active Problem List   Diagnosis Date Noted   Constipation 01/10/2024   Nausea & vomiting 01/10/2024   S/P total right hip arthroplasty 11/14/2023   Blood coagulation disorder (HCC) 11/10/2023   PTSD (post-traumatic stress disorder) 11/10/2023   Severe episode of recurrent major depressive disorder, without psychotic features (HCC) 11/10/2023   GAD (generalized anxiety disorder) 11/10/2023   High risk medication use 11/10/2023   Anxiety and depression 03/22/2023   Cervical radiculopathy 03/16/2023   Colon cancer screening 03/15/2023   Chronic idiopathic constipation 03/15/2023   Dyspnea 03/07/2023   [facility] spotted fever 03/07/2023   Strain of gastrocnemius tendon 03/07/2023   Lower abdominal pain 03/07/2023  Pain in pelvis 03/07/2023   Small bowel obstruction (HCC) 02/11/2023   Raynaud disease 02/11/2023   Polyarthralgia 02/11/2023   Edema 01/03/2023   Diverticular disease 12/10/2022   Hot flashes 12/10/2022   Pain of right lower extremity 12/10/2022   Paresthesia of lower extremity 12/10/2022   Primary insomnia 12/10/2022   Thyroid  nodule 12/10/2022   Multiple joint pain 12/10/2022   Basal cell carcinoma of skin 12/10/2022   Acute bilateral low back pain without sciatica 11/10/2022   Chronic lower back  pain 09/05/2022   S/P closure of ileostomy 08/24/2022   Ileostomy in place [facility])    Morphea 07/13/2022   Colonic stricture (HCC) 05/19/2022   Large bowel obstruction (HCC) 05/19/2022   Hypokalemia 05/18/2022   Abdominal pain 05/18/2022   Osteoarthritis of left knee 02/24/2022   Mixed connective tissue disease (HCC) 02/17/2022   Adhesive capsulitis of left shoulder 01/27/2022   Glenohumeral arthritis, left 01/27/2022   Microscopic hematuria 01/12/2022   Otorrhagia of right ear 12/10/2021   Elevated testosterone level in female 11/04/2021   Anti-TPO antibodies present 10/20/2021   Rash 10/13/2021   Hashimoto thyroiditis, 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   Hashimoto's disease 03/23/2021   Exposure to severe acute respiratory syndrome coronavirus 2 (SARS-CoV-2) 12/19/2020   Migraine without aura and without status migrainosus, not intractable 08/19/2020   Myofascial pain 07/30/2020   Neck pain 07/30/2020   Upper back pain 07/30/2020   Cervical facet joint syndrome 07/30/2020   Hair loss 07/14/2020   Edema of lower extremity 07/11/2020   Adenomatous colon polyp 02/26/2020   Gastroesophageal reflux disease 02/15/2020   Diarrhea 02/15/2020   Sleep disorder 01/16/2020   Hyperlipidemia 07/11/2019   Right shoulder pain 04/04/2019   Pre-operative clearance 12/13/2018   Endometrial cancer (HCC) 04/05/2018   Patellofemoral pain syndrome of right knee 03/20/2018   Gastroenteritis 12/07/2017   Fatigue 11/16/2017   Morbid obesity (HCC) 08/11/2017   Stucco keratoses 08/11/2017   Essential hypertension 08/09/2017   History of endometrial cancer 08/09/2017   Lymphedema 08/09/2017   Menopause 08/09/2017   Malignant neoplastic disease (HCC) 04/07/2017   Cellulitis 09/18/2016   Benign hypertension 06/02/2016   Asthma 06/04/1969    PCP: Glenwood Blanch, MD  REFERRING PROVIDER: Glenwood Blanch, MD  REFERRING DIAG: I89.0   THERAPY DIAG:  Lymphedema, not elsewhere classified  Rationale for  Evaluation and Treatment: Rehabilitation  ONSET DATE: 2013  (Cancer-related, endometrial 2013)  SUBJECTIVE:  SUBJECTIVE STATEMENT:Patient returns to OT for lymphedema care to RLE/RLQ after vacation to [place] . Pt was last seen on 05/16/24.   Pt rates pain at 6/10 today, stating SHE'S NOW WEANING OFF of steroid and chronic pain persists. Posture and facial expression demonstrate Pt is clearly in pain today. Pt reports she received custom garments , but is unable to get the [employer] on without exceptional effort. She reports her spouse is unable to get these on her as well. Because Pt is unable to use them in the current condition OT will email [employer] rep that assisted with measurements and request a remake.    PERTINENT HISTORY: Asthma, Autoimmune Disease (Connective Tissue Disease), Endometrial Ca w/ LND ( 30 bilateral pelvic and periaortic LN), adjuvant chemotherapy and XRT,   H/O blood clots, RLE/RLQ lymphedema, Robotic assisted total hysterectomy w/ bilateral oophorectomy L Rotator Cuff repair, S/P ileostomy 2/2 colon stricture 05/2022; s/p R hip arthroplasty 11/11/23, scleroderma morphia bilateral groin  PAIN:  Are you having pain? YES. See subjective; 6/10 Pain location: RLE, R hip, cervical spine Pain description: sore, aching, heavy, full, tight,  Aggravating factors: standing, walking, extended dependent sitting Relieving factors: elevation, movement  PRECAUTIONS: Fall and Other: LYMPHEDEMA  WEIGHT BEARING RESTRICTIONS: Yes 5# lifting restriction- shoulder  FALLS:  Has patient fallen in last 6 months? No  LIVING ENVIRONMENT: Lives with: lives with their spouse Lives in: House/apartment Stairs: No;  Has following equipment at home: Single point cane, Environmental consultant - 2 wheeled, Environmental consultant - 4 wheeled, and  Wheelchair (manual)  OCCUPATION: retired Control and instrumentation engineer: gardening, hiking, biking, dancing- unable to participate in any of these due to pain and swelling  HAND DOMINANCE: right   PRIOR LEVEL OF FUNCTION: Independent with basic ADLs, Independent with household mobility without device, Independent with community mobility without device, Requires assistive device for independence, Needs assistance with homemaking, Needs assistance with transfers, and Leisure: decreased  social participation for leisure pursuits due to impaired mobility and pain  PATIENT GOALS:  Be able to move freely without pain Reduce limb volume to be able to lift limb for basic daily ADLs and functional ambulation Reduce limb volume to increase ability to perform lower body dressing and bathing and grooming Reduce limb to increase body image  OBJECTIVE: Note: Objective measures were completed at Evaluation unless otherwise noted.  COGNITION:  Overall cognitive status: Within functional limits for tasks assessed   OBSERVATIONS / OTHER ASSESSMENTS:   POSTURE: WFL  LE ROM: WFL, but Limited mildly at R knee and ankle due to girth, skin approximation, and joint pain  LE MMT: WFL  Mild, Stage  II, Bilateral Lower Extremity Lymphedema 2/2 CVI and Obesity  Skin  Description Hyper-Keratosis Peau d' Orange Shiny Tight Fibrotic/ Indurated Fatty Doughy Spongy/ boggy   x x x x R>L Severe morphea at  R groin obstructing lymphatics and skin excursion   x   Skin dry Flaky WNL Macerated   mildly sclerotic     Color Redness Varicosities Blanching Hemosiderin Stain Mottled   x x x   x   Odor Malodorous Yeast Fungal infection  WNL      x   Temperature Warm Cool wnl    x     Pitting Edema   1+ 2+ 3+ 4+ Non-pitting         x   Girth Symmetrical Asymmetrical                   Distribution  R>L RLE toes to groin    Stemmer Sign Positive Negative   +    Lymphorrhea History Of:  Present Absent     x     Wounds History Of Present Absent Venous Arterial Pressure Sheer     x        Signs of Infection Redness Warmth Erythema Acute Swelling Drainage Borders                    Sensation Light Touch Deep pressure Hypersensitivity   In tact Impaired In tact Impaired Absent Impaired   x  x  x     Nails WNL   Fungus nail dystrophy   x     Hair Growth Symmetrical Asymmetrical    R>L   Skin Creases Base of toes  Ankles   Base of Fingers knees       Abdominal pannus Thigh Lobules  Face/neck   x x  x        BLE COMPARATIVE LIMB VOLUMETRICS 10/10/23  LANDMARK RIGHT  10/10/23  R LEG (A-D) 5466.5 ml  R THIGH (E-G) 9186.6 ml  R FULL LIMB (A-G) 14653.1 ml  Limb Volume differential (LVD)  LVD for LEG = 44.1%, R>L LVD for THIGH= 33.98%, R>L LVD for FULL lower extremity 37.8%, R>L  Volume change since last 08/11/22 R LEG is INCREASED in volume by 59.6%. L THIGH is INCREASED by 38 %, and RLE full limb is INCREASED by 45.38%.  Volume change overall V  (Blank rows = not tested)  LANDMARK LEFT  10/12/23  L LEG (A-D) 3053.6 ml  L THIGH (E-G) 6064.8 ml  L  FULL LIMB (A-G) 9118.4 ml  Limb Volume differential (LVD)  %  Volume change since initial %  Volume change overall %  (Blank rows = not tested)   10 th VISIT PROGRESS NOTE RLE COMPARATIVE LIMB VOLUMETRICS 01/04/24: Deferred until next visit by Pt request.  LANDMARK RIGHT    R LEG (A-D) TBA  R THIGH (E-G) TBA  R FULL LIMB (A-G) TBA  Limb Volume differential (LVD)    Volume change since last 08/11/22 TBA  Volume change overall TBA  (Blank rows = not tested)  20 th VISIT PROGRESS NOTE RLE COMPARATIVE LIMB VOLUMETRICS TBA next session  LANDMARK RIGHT    R LEG (A-D) 6005.7 ml  R THIGH (E-G) 9400.3 ml  R FULL LIMB (A-G) 15406.02 ml  Limb Volume differential (LVD)    Volume change since last measured on 10/10/23 R LEG is INCREASED in volume by 9.9%. L THIGH is INCREASED by 2.32 %, and RLE full limb is INCREASED by 5.13% since last  measured on 10/10/23.  Volume change since commencing OT for CDT %    28 th VISIT PROGRESS NOTE RLE COMPARATIVE LIMB VOLUMETRICS TBA next session  LANDMARK RIGHT    R LEG (A-D) 5936.1 ml  R THIGH (E-G) 8538.6  ml  R FULL LIMB (A-G) 14524.7  ml  Limb Volume differential (LVD)    Volume change since last measured on 10/10/23 R LEG is DECREASED in volume by 1.2 %.  Since last measured on 03/14/24. R THIGH is DECREASED by 8.6 %, and RLE full limb is DECREASED by 5.72 % since last measured on 03/14/24.  Volume change since commencing OT for CDT %   40 th VISIT RLE COMPARATIVE LIMB VOLUMETRICS deferred to next session due to pain level and time constraints  LANDMARK RIGHT    R LEG (A-D)  R THIGH (E-G)   R FULL LIMB (A-G)   Limb Volume differential (LVD)    Volume change since last measured on 10/10/23   Volume change since commencing OT for CDT %    ENDOMETRIAL CA-related LYMPHEDEMA Hx:  SURGERY TYPE/DATE: 2013 NUMBER OF LYMPH NODES REMOVED: 30 by report- bilateral pelvic and periaortic CHEMOTHERAPY: yes RADIATION:yes INFECTIONS: no hx cellulitis  LYMPHEDEMA LIFE IMPACT SCALE (LLIS): Intake 10/12/23 75% (The extent to which lymphedema related problems impacted your life over the past week)  FOTO (functional outcome measure):10/10/23 INTAKE: 36% No Out take score. FOTO assessment discontinued by clinic.  PATIENT EDUCATION:  Education details: Continued Pt/ CG edu for lymphedema self care home program throughout session. Topics include outcome of comparative limb volumetrics- starting limb volume differentials (LVDs), technology and gradient techniques used for short stretch, multilayer compression wrapping, simple self-MLD, therapeutic lymphatic pumping exercises, skin/nail care, LE precautions,. compression garment recommendations and specifications, wear and care schedule and compression garment donning / doffing w assistive devices. Discussed progress towards all OT goals since commencing  CDT. All questions answered to the Pt's satisfaction. Good return. Person educated: Patient Education method: Explanation, Demonstration, and Handouts Education comprehension: verbalized understanding, returned demonstration, and needs further education  LE SELF-CARE HOME  PROGRAM: Simple self-MLD/daily to affected quadrant and body part At least 2 x daily BLE Lymphatic Pumping There ex 1 set of 10 reps each, in order, bilaterally Daily skin care to affected body part to limit infection risk and increase skin excursion Compression Bandaging Intensive stage compression: multilayer short stretch wraps with gradient techniques. One limb at a time. Length patient dependent. Self-management Phase: Appropriate daytime compression garment and hours-of-sleep device  Compression garments: Custom-made gradient compression garments and hours-of-sleep devices are medically necessary because they are uniquely sized and shaped to fit the exact dimensions of the affected extremities and to provide accurate and consistent gradient compression and containment, essential  for optimally managing chronic, progressive lymphedema. The convoluted HOS devices are medically necessary to facilitate increased lymphatic circulation and limit fibrosis formation when sleeping. Multiple custom compression garments are needed for optimal hygiene to limit infection risk. Custom compression garments should be replaced q 3-6 months When worn consistently for optimal lipo-lymphedema self-management over time.  ASSESSMENT:  CLINICAL IMPRESSION:    Please refer to GOALS section for detailed progress notes. Volumetrics deferred to next session 2/2 time constraints.Pt continues to make slow progress towards goals. RLE remains quite swollen and dense in response to chronic, systemic inflammation, including recent R hip replacement. Pt also used less than adequate compression garments while on vacation for nearly a month because new  custom garments do not fit. This was not reported before Pt left on vacation due to time constraints. Pt used instead ccl 2 , off the shelf, circular knit, Juzo, chaps style, compression garments daily. These garments are inadequate for controlling dense swelling and RLE is quite swollen and densely congested today. We resumed MLD and included fibrosis techniques distally. Reapplied compression wraps as established. No visible or palpable change observed in swelling or tissue density after manual therapy. OT to email manufacturer's rep and ask if we can get remakes for comp 'd garments. Cont as per POC 2-3 x weekly ongoing to support management of severe, stage II, RLE/RLQ lymphedema.   10/10/23 Lymphedema Episode 2, Initial OT Evaluation : Mykeisha Dysert is a 29 y a female presenting with chronic, progressive, moderate, Stage II, RLE/RLQ cancer -related lymphedema with onset 11 years ago after Rx for  endometrial cancer. Pt is well known to this therapist as she has successfully undergone Complete Decongestive Therapy (CDT)  this clinic bin the past. Pt reports recent exacerbation of RLE swelling worsened as inflammation in L hip has worsened over time.  Pt has a hip replacement scheduled in late December here it [facility]. RE limb volumetrics today reveal that R LEG volume is dramatically increased by 59.65 since last visit. R LEG VOLUME is INCREASED in volume by 59.6%. L THIGH is INCREASED by 38 %, and RLE full limb is INCREASED by 45.38%. Pt presents with LE-related skin changes. Prolonged inflammation in the R hip joint has likely overloaded  already RLE/RLQ lymphatics.  RLE/RLQ lymphedema limits Pt's ability to perform basic and instrumental ADLs, including functional ambulation, mobility and transfers, grooming , lower body bathing and dressing, skin inspection and skin care. Pt has difficulty  reaching her lower legs and feet to apply compression wraps and don/doff        compression stockings. LE limits  ability to P[perform instrumental ADLs, including driving, yard work and home management activities. Lymphedema limits her ability to participate in leisure pursuits and productive activities, and it negatively impacts body image, life roles and quality of life.   Pt will benefit from an Intensive and follow along course of lymphedema. CDT will consist of Manual Lymphatic gainage   (MLD), skin care, therapeutic exercise and compression, wraps initially then garments. Pt will need assistance throughout CDT   for applying compression wraps due to limited hip AROM and decreased skin flexibility. Without skilled Occupational Therapy for lymphedema care, lymphedema will progress and further functional decline is expected.   In prep for upcoming hip replacement OT will educate Pt re assistive devices, including tub transfer bench and elevated toilet seat, in keeping with hip precautions.  OBJECTIVE IMPAIRMENTS: Abnormal gait, decreased activity tolerance, decreased balance, decreased knowledge of use of DME, decreased mobility, difficulty walking, decreased ROM, decreased strength, increased edema, decreased skin flexibility, increased fascial restrictions, impaired sensation, pain, and chronic, progressive LLE/LLQ lymphatic swelling and associated pain.   ADL LIMITATIONS: carrying, lifting, bending, sitting, standing, squatting, sleeping, stairs, transfers, bed mobility, bathing, dressing, hygiene/grooming, and productive activities, leisure pursuits, social participation, body image driving, shopping, housework, yard work, cooking, meal prep  PERSONAL FACTORS: Past/current experiences and 3+ comorbidities: Morphea Scleroderma, Mixed connective tissue disease, and osteoarthritis are also affecting patient's functional outcome.   REHAB POTENTIAL: Good  EVALUATION COMPLEXITY: Moderate  GOALS: Goals reviewed with patient? Yes  SHORT TERM GOALS: Target date: 4th OT Rx visit   Pt will demonstrate  understanding of lymphedema precautions and prevention strategies with modified independence using a printed reference to identify at least 5 precautions and discussing how s/he may implement them into daily life to reduce risk of progression with extra time.  Baseline:Max A Goal status: GOAL MET  2.  Pt will be able to apply multilayer, knee length, gradient, compression wraps to one leg at a time with modified assistance (extra time and assistive device/s) to decrease limb volume, to limit infection risk, and to limit lymphedema progression.  Baseline: Dependent Goal status: GOAL MET  LONG TERM GOALS: Target date: 01/09/24 (12 weeks)  Given this patient's Intake score 36% on the functional outcomes FOTO tool, patient will experience an increase in function of 3 points to improve basic and instrumental ADLs performance, including lymphedema self-care.  Baseline: Max A Goal status: DEFERRED as LORALIE has been discontinued  2.  Given this patient's Intake score of  75% on the Lymphedema Life Impact Scale (LLIS), patient will experience a reduction of at least 5 points in her perceived level of functional impairment resulting from lymphedema to improve functional performance and quality of life (QOL). Baseline: % Goal status: PROGRESSING  3.  Pt will achieve at least a 10% volume reduction in full, RLE limb volume to return limb to typical size and shape, to limit infection risk and LE progression, to decrease pain, to improve function. Baseline: Dependent Goal status:PROGRESSING. See chart   4.  Pt will obtain proper compression garments/devices and achieve modified independence (extra time + assistive devices) with donning/doffing to optimize limb volume reductions and limit LE  progression over time. Baseline:  Goal status: 01/04/24: PARTIALLY MET; In an effort to reduce burden of care on her spouse, Pt obtained adjustable, Velcro style R full leg, Circaid leg reduction kit for use s/p THA, but  this proved not effective for controlling swelling, and were not easier to don and doff more independently. We've resumed wrapping until she is able to fit back into custom compression garments. 02/14/24: Ongoing 04/11/24: Submitted new garment measurements to DME vendor for 3 piece system to address worsening lymphedema. Pt's insurance company denied coverage. She in the process of appealing.  5.  During Intensive phase CDT , with modified independence, Pt will achieve at least 85% compliance with all lymphedema self-care home program components, including daily skin care, compression wraps and /or garments, simple self MLD and lymphatic pumping therex to habituate LE self care protocol  into ADLs for optimal LE self-management over time. Baseline: Dependent Goal status: 02/14/24: GOAL MET and EXCEEDED   06/19/24: During the last month Pt has been without custom compression garments and has not been able to use sequential device while on vacation. PLAN:  OT FREQUENCY: 2-3 x/week  OT DURATION: 12 weeks and PRN  PLANNED INTERVENTIONS: Complete Decongestive Therapy (Intensive and supported Self-Management Phases), 97110-Therapeutic exercises, 97530- Therapeutic activity, 97535- Self Care, [id number]- Manual therapy, Patient/Family education, Taping, Manual lymph drainage, Scar mobilization, Compression bandaging, DME instructions, and skin care to reduce infection risk throughout manual therapy. Fit with replacement compression garments ASAP  PLAN FOR NEXT SESSION:  Cont MLD as established Cont multilayer compression wrapping   Zebedee Dec, MS, OTR/L, CLT-LANA 06/21/24 9:43 AM

## 2024-06-25 ENCOUNTER — Ambulatory Visit: Admitting: Occupational Therapy

## 2024-06-25 ENCOUNTER — Ambulatory Visit

## 2024-06-27 ENCOUNTER — Ambulatory Visit: Admitting: Occupational Therapy

## 2024-06-27 ENCOUNTER — Ambulatory Visit

## 2024-07-02 ENCOUNTER — Ambulatory Visit: Admitting: Occupational Therapy

## 2024-07-02 ENCOUNTER — Ambulatory Visit

## 2024-07-04 ENCOUNTER — Ambulatory Visit: Admitting: Occupational Therapy

## 2024-07-04 ENCOUNTER — Ambulatory Visit

## 2024-07-09 ENCOUNTER — Ambulatory Visit

## 2024-07-09 ENCOUNTER — Ambulatory Visit: Attending: Family Medicine | Admitting: Occupational Therapy

## 2024-07-09 DIAGNOSIS — I890 Lymphedema, not elsewhere classified: Secondary | ICD-10-CM | POA: Diagnosis present

## 2024-07-09 NOTE — Therapy (Signed)
 OUTPATIENT OCCUPATIONAL THERAPY TREATMENT NOTE AND DISCHARGE SUMMARY  RIGHT LOWER EXTREMITY/ R LOWER QUADRANT LYMPHEDEMA  Patient Name: Elizabeth Martin MRN: [id number] DOB:December 02, 1961, 63 y.o., female Today's Date: 07/09/2024  REPORTING PERIOD:   END OF SESSION:  Lymphedema Episode 2   OT End of Session - 07/09/24 1210     Visit Number 41    Number of Visits 72    Date for OT Re-Evaluation 07/17/24    OT Start Time 1105    OT Stop Time 1205    OT Time Calculation (min) 60 min    Activity Tolerance Patient tolerated treatment well;No increased pain;Patient limited by pain    Behavior During Therapy [facility] for tasks assessed/performed           Past Medical History:  Diagnosis Date   Anxiety    Asthma    Autoimmune disease (HCC)    Cancer (HCC)    Endometrial lymph node removal (30)   Corneal abrasion, right 05/24/2022   Depression    GERD (gastroesophageal reflux disease)    H/O blood clots    upper rt groin and behind both knees   History of hiatal hernia    Hypertension    Lymphedema    right leg   Morphea scleroderma    PONV (postoperative nausea and vomiting)    states gets violently ill   Past Surgical History:  Procedure Laterality Date   BASAL CELL CARCINOMA EXCISION Left    CHOLECYSTECTOMY  2008   FLEXIBLE SIGMOIDOSCOPY N/A 05/19/2022   Procedure: FLEXIBLE SIGMOIDOSCOPY;  Surgeon: Toledo, Ladell POUR, MD;  Location: [facility] ENDOSCOPY;  Service: Gastroenterology;  Laterality: N/A;   HERNIA REPAIR  2004   ILEOSTOMY CLOSURE N/A 08/24/2022   Procedure: ILEOSTOMY TAKEDOWN, open loop with Arthea Platt, PA-C to assist;  Surgeon: Desiderio Schanz, MD;  Location: [facility] ORS;  Service: General;  Laterality: N/A;   IVC FILTER INSERTION N/A 11/09/2023   Procedure: IVC FILTER INSERTION;  Surgeon: Marea Selinda RAMAN, MD;  Location: [facility] INVASIVE CV LAB;  Service: Cardiovascular;  Laterality: N/A;   IVC FILTER REMOVAL N/A 01/02/2024   Procedure: IVC FILTER REMOVAL;  Surgeon: Marea Selinda RAMAN, MD;  Location: [facility] INVASIVE CV LAB;  Service: Cardiovascular;  Laterality: N/A;   PLANTAR FASCIECTOMY     ROBOTIC ASSISTED TOTAL HYSTERECTOMY WITH BILATERAL SALPINGO OOPHERECTOMY  02/14/2012   ROTATOR CUFF REPAIR Left 03/24/2022   TONSILLECTOMY     TOTAL HIP ARTHROPLASTY Right 11/14/2023   Procedure: TOTAL HIP ARTHROPLASTY - posterior;  Surgeon: Lorelle Arthea, MD;  Location: [facility] ORS;  Service: Orthopedics;  Laterality: Right;   XI ROBOTIC ASSISTED LOWER ANTERIOR RESECTION N/A 05/24/2022   Procedure: XI ROBOTIC ASSISTED LOWER ANTERIOR RESECTION;  Surgeon: Desiderio Schanz, MD;  Location: [facility] ORS;  Service: General;  Laterality: N/A;   Patient Active Problem List   Diagnosis Date Noted   Constipation 01/10/2024   Nausea & vomiting 01/10/2024   S/P total right hip arthroplasty 11/14/2023   Blood coagulation disorder (HCC) 11/10/2023   PTSD (post-traumatic stress disorder) 11/10/2023   Severe episode of recurrent major depressive disorder, without psychotic features (HCC) 11/10/2023   GAD (generalized anxiety disorder) 11/10/2023   High risk medication use 11/10/2023   Anxiety and depression 03/22/2023   Cervical radiculopathy 03/16/2023   Colon cancer screening 03/15/2023   Chronic idiopathic constipation 03/15/2023   Dyspnea 03/07/2023   [facility] spotted fever 03/07/2023   Strain of gastrocnemius tendon 03/07/2023   Lower abdominal pain 03/07/2023   Pain  in pelvis 03/07/2023   Small bowel obstruction (HCC) 02/11/2023   Raynaud disease 02/11/2023   Polyarthralgia 02/11/2023   Edema 01/03/2023   Diverticular disease 12/10/2022   Hot flashes 12/10/2022   Pain of right lower extremity 12/10/2022   Paresthesia of lower extremity 12/10/2022   Primary insomnia 12/10/2022   Thyroid  nodule 12/10/2022   Multiple joint pain 12/10/2022   Basal cell carcinoma of skin 12/10/2022   Acute bilateral low back pain without sciatica 11/10/2022   Chronic lower back pain 09/05/2022    S/P closure of ileostomy 08/24/2022   Ileostomy in place [facility])    Morphea 07/13/2022   Colonic stricture (HCC) 05/19/2022   Large bowel obstruction (HCC) 05/19/2022   Hypokalemia 05/18/2022   Abdominal pain 05/18/2022   Osteoarthritis of left knee 02/24/2022   Mixed connective tissue disease (HCC) 02/17/2022   Adhesive capsulitis of left shoulder 01/27/2022   Glenohumeral arthritis, left 01/27/2022   Microscopic hematuria 01/12/2022   Otorrhagia of right ear 12/10/2021   Elevated testosterone level in female 11/04/2021   Anti-TPO antibodies present 10/20/2021   Rash 10/13/2021   Hashimoto thyroiditis, 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   Hashimoto's disease 03/23/2021   Exposure to severe acute respiratory syndrome coronavirus 2 (SARS-CoV-2) 12/19/2020   Migraine without aura and without status migrainosus, not intractable 08/19/2020   Myofascial pain 07/30/2020   Neck pain 07/30/2020   Upper back pain 07/30/2020   Cervical facet joint syndrome 07/30/2020   Hair loss 07/14/2020   Edema of lower extremity 07/11/2020   Adenomatous colon polyp 02/26/2020   Gastroesophageal reflux disease 02/15/2020   Diarrhea 02/15/2020   Sleep disorder 01/16/2020   Hyperlipidemia 07/11/2019   Right shoulder pain 04/04/2019   Pre-operative clearance 12/13/2018   Endometrial cancer (HCC) 04/05/2018   Patellofemoral pain syndrome of right knee 03/20/2018   Gastroenteritis 12/07/2017   Fatigue 11/16/2017   Morbid obesity (HCC) 08/11/2017   Stucco keratoses 08/11/2017   Essential hypertension 08/09/2017   History of endometrial cancer 08/09/2017   Lymphedema 08/09/2017   Menopause 08/09/2017   Malignant neoplastic disease (HCC) 04/07/2017   Cellulitis 09/18/2016   Benign hypertension 06/02/2016   Asthma 06/04/1969    PCP: Glenwood Blanch, MD  REFERRING PROVIDER: Glenwood Blanch, MD  REFERRING DIAG: I89.0   THERAPY DIAG:  Lymphedema, not elsewhere classified  Rationale for Evaluation and  Treatment: Rehabilitation  ONSET DATE: 2013  (Cancer-related, endometrial 2013)  SUBJECTIVE:  SUBJECTIVE STATEMENT:Pt was last seen on 05/22/24.  Posture and facial expression demonstrate Pt is clearly in pain today, but she does not rate it numerically. Pt reports she has been essentially bedridden for the last 2 weeks after weaning off the steroids she was taking for pain.   Pt states she is unable to return to OT after our visit today due to changes in her insurance and coverage. This is unfortunate as her case is severe; it is nearly intractable to CDT, and her condition continues to worsen over time.  Emphasis of our visit today is on  re-measuring and converting custom compression garments recently provided by the garment manufacturer. Because Pt is unable to fit the Capri length thigh high with bike shorts attached over the knee length garment, we are remaking the garment without the shorts.    PERTINENT HISTORY: Asthma, Autoimmune Disease (Connective Tissue Disease), Endometrial Ca w/ LND ( 30 bilateral pelvic and periaortic LN), adjuvant chemotherapy and XRT,   H/O blood clots, RLE/RLQ lymphedema, Robotic assisted total hysterectomy w/ bilateral oophorectomy L Rotator Cuff repair, S/P ileostomy 2/2 colon stricture 05/2022; s/p R hip arthroplasty 11/11/23, scleroderma morphia bilateral groin  PAIN:  Are you having pain? YES. See subjective; not rated. Pain location: RLE, R hip, cervical spine Pain description: sore, aching, heavy, full, tight,  Aggravating factors: standing, walking, extended dependent sitting Relieving factors: elevation, movement  PRECAUTIONS: Fall and Other: LYMPHEDEMA  WEIGHT BEARING RESTRICTIONS: Yes 5# lifting restriction- shoulder  FALLS:  Has patient fallen in last 6 months?  No  LIVING ENVIRONMENT: Lives with: lives with their spouse Lives in: House/apartment Stairs: No;  Has following equipment at home: Single point cane, Environmental consultant - 2 wheeled, Environmental consultant - 4 wheeled, and Wheelchair (manual)  OCCUPATION: retired Control and instrumentation engineer: gardening, hiking, biking, dancing- unable to participate in any of these due to pain and swelling  HAND DOMINANCE: right   PRIOR LEVEL OF FUNCTION: Independent with basic ADLs, Independent with household mobility without device, Independent with community mobility without device, Requires assistive device for independence, Needs assistance with homemaking, Needs assistance with transfers, and Leisure: decreased  social participation for leisure pursuits due to impaired mobility and pain  PATIENT GOALS:  Be able to move freely without pain Reduce limb volume to be able to lift limb for basic daily ADLs and functional ambulation Reduce limb volume to increase ability to perform lower body dressing and bathing and grooming Reduce limb to increase body image  OBJECTIVE: Note: Objective measures were completed at Evaluation unless otherwise noted.  COGNITION:  Overall cognitive status: Within functional limits for tasks assessed   OBSERVATIONS / OTHER ASSESSMENTS:   POSTURE: WFL  LE ROM: WFL, but Limited mildly at R knee and ankle due to girth, skin approximation, and joint pain  LE MMT: WFL  Mild, Stage  II, Bilateral Lower Extremity Lymphedema 2/2 CVI and Obesity  Skin  Description Hyper-Keratosis Peau d' Orange Shiny Tight Fibrotic/ Indurated Fatty Doughy Spongy/ boggy   x x x x R>L Severe morphea at  R groin obstructing lymphatics and skin excursion   x   Skin dry Flaky WNL Macerated   mildly sclerotic     Color Redness Varicosities Blanching Hemosiderin Stain Mottled   x x x   x   Odor Malodorous Yeast Fungal infection  WNL      x   Temperature Warm Cool wnl    x     Pitting Edema   1+ 2+ 3+ 4+ Non-pitting  x   Girth Symmetrical Asymmetrical                   Distribution    R>L RLE toes to groin    Stemmer Sign Positive Negative   +    Lymphorrhea History Of:  Present Absent     x    Wounds History Of Present Absent Venous Arterial Pressure Sheer     x        Signs of Infection Redness Warmth Erythema Acute Swelling Drainage Borders                    Sensation Light Touch Deep pressure Hypersensitivity   In tact Impaired In tact Impaired Absent Impaired   x  x  x     Nails WNL   Fungus nail dystrophy   x     Hair Growth Symmetrical Asymmetrical    R>L   Skin Creases Base of toes  Ankles   Base of Fingers knees       Abdominal pannus Thigh Lobules  Face/neck   x x  x        BLE COMPARATIVE LIMB VOLUMETRICS 10/10/23  LANDMARK RIGHT  10/10/23  R LEG (A-D) 5466.5 ml  R THIGH (E-G) 9186.6 ml  R FULL LIMB (A-G) 14653.1 ml  Limb Volume differential (LVD)  LVD for LEG = 44.1%, R>L LVD for THIGH= 33.98%, R>L LVD for FULL lower extremity 37.8%, R>L  Volume change since last 08/11/22 R LEG is INCREASED in volume by 59.6%. L THIGH is INCREASED by 38 %, and RLE full limb is INCREASED by 45.38%.  Volume change overall V  (Blank rows = not tested)  LANDMARK LEFT  10/12/23  L LEG (A-D) 3053.6 ml  L THIGH (E-G) 6064.8 ml  L  FULL LIMB (A-G) 9118.4 ml  Limb Volume differential (LVD)  %  Volume change since initial %  Volume change overall %  (Blank rows = not tested)   10 th VISIT PROGRESS NOTE RLE COMPARATIVE LIMB VOLUMETRICS 01/04/24: Deferred until next visit by Pt request.  LANDMARK RIGHT    R LEG (A-D) TBA  R THIGH (E-G) TBA  R FULL LIMB (A-G) TBA  Limb Volume differential (LVD)    Volume change since last 08/11/22 TBA  Volume change overall TBA  (Blank rows = not tested)  20 th VISIT PROGRESS NOTE RLE COMPARATIVE LIMB VOLUMETRICS TBA next session  LANDMARK RIGHT    R LEG (A-D) 6005.7 ml  R THIGH (E-G) 9400.3 ml  R FULL LIMB (A-G) 15406.02 ml   Limb Volume differential (LVD)    Volume change since last measured on 10/10/23 R LEG is INCREASED in volume by 9.9%. L THIGH is INCREASED by 2.32 %, and RLE full limb is INCREASED by 5.13% since last measured on 10/10/23.  Volume change since commencing OT for CDT %    28 th VISIT PROGRESS NOTE RLE COMPARATIVE LIMB VOLUMETRICS TBA next session  LANDMARK RIGHT    R LEG (A-D) 5936.1 ml  R THIGH (E-G) 8538.6  ml  R FULL LIMB (A-G) 14524.7  ml  Limb Volume differential (LVD)    Volume change since last measured on 10/10/23 R LEG is DECREASED in volume by 1.2 %.  Since last measured on 03/14/24. R THIGH is DECREASED by 8.6 %, and RLE full limb is DECREASED by 5.72 % since last measured on 03/14/24.  Volume change since commencing OT for CDT %   40  th VISIT RLE COMPARATIVE LIMB VOLUMETRICS deferred to next session due to pain level and time constraints  LANDMARK RIGHT    R LEG (A-D)   R THIGH (E-G)   R FULL LIMB (A-G)   Limb Volume differential (LVD)    Volume change since last measured on 10/10/23   Volume change since commencing OT for CDT %    ENDOMETRIAL CA-related LYMPHEDEMA Hx:  SURGERY TYPE/DATE: 2013 NUMBER OF LYMPH NODES REMOVED: 30 by report- bilateral pelvic and periaortic CHEMOTHERAPY: yes RADIATION:yes INFECTIONS: no hx cellulitis  LYMPHEDEMA LIFE IMPACT SCALE (LLIS): Intake 10/12/23 75% (The extent to which lymphedema related problems impacted your life over the past week)  FOTO (functional outcome measure):10/10/23 INTAKE: 36% No Out take score. FOTO assessment discontinued by clinic.  PATIENT EDUCATION:  Education details: Continued Pt/ CG edu for lymphedema self care home program throughout session. Topics include outcome of comparative limb volumetrics- starting limb volume differentials (LVDs), technology and gradient techniques used for short stretch, multilayer compression wrapping, simple self-MLD, therapeutic lymphatic pumping exercises, skin/nail care, LE  precautions,. compression garment recommendations and specifications, wear and care schedule and compression garment donning / doffing w assistive devices. Discussed progress towards all OT goals since commencing CDT. All questions answered to the Pt's satisfaction. Good return. Person educated: Patient Education method: Explanation, Demonstration, and Handouts Education comprehension: verbalized understanding, returned demonstration, and needs further education  LE SELF-CARE HOME  PROGRAM: Simple self-MLD/daily to affected quadrant and body part At least 2 x daily BLE Lymphatic Pumping There ex 1 set of 10 reps each, in order, bilaterally Daily skin care to affected body part to limit infection risk and increase skin excursion Compression Bandaging Intensive stage compression: multilayer short stretch wraps with gradient techniques. One limb at a time. Length patient dependent. Self-management Phase: Appropriate daytime compression garment and hours-of-sleep device  Compression garments: Custom-made gradient compression garments and hours-of-sleep devices are medically necessary because they are uniquely sized and shaped to fit the exact dimensions of the affected extremities and to provide accurate and consistent gradient compression and containment, essential  for optimally managing chronic, progressive lymphedema. The convoluted HOS devices are medically necessary to facilitate increased lymphatic circulation and limit fibrosis formation when sleeping. Multiple custom compression garments are needed for optimal hygiene to limit infection risk. Custom compression garments should be replaced q 3-6 months When worn consistently for optimal lipo-lymphedema self-management over time.  ASSESSMENT:  CLINICAL IMPRESSION:  Completed anatomical measurements for conversion of unusable custom garments Pt received several weeks ago. Converted single leg Capri style pantyhose with attached bike shorts and  thigh length to similar overlapping garment system but omitting the shorts. Although these would be helpful for micro massaging morphia at groin, Pt was unable to don and doff this garment. Today we completed measurements for a class 3 , flat knit knee high to be worn under a thigh length, B-G Capri thigh high.  Pt is unable to return to OT after our visit today due to changes in her insurance and coverage. This is unfortunate as her case is severe; it is nearly intractable to CDT, and her condition continues to worsen over time. Pt will continue to benefit from CDT to support lymphedema control and reduced infection risk. I am happy to work with her again for therapy, and to serve as an advocate on her behalf PRN. Pt is discharged from OT today. A new referral is needed to resume OT for CDT.  10/10/23 Lymphedema Episode 2, Initial OT Evaluation :  Carilyn Woolston is a 33 y a female presenting with chronic, progressive, moderate, Stage II, RLE/RLQ cancer -related lymphedema with onset 11 years ago after Rx for endometrial cancer. Pt is well known to this therapist as she has successfully undergone Complete Decongestive Therapy (CDT)  this clinic bin the past. Pt reports recent exacerbation of RLE swelling worsened as inflammation in L hip has worsened over time.  Pt has a hip replacement scheduled in late December here it [facility]. RE limb volumetrics today reveal that R LEG volume is dramatically increased by 59.65 since last visit. R LEG VOLUME is INCREASED in volume by 59.6%. L THIGH is INCREASED by 38 %, and RLE full limb is INCREASED by 45.38%. Pt presents with LE-related skin changes. Prolonged inflammation in the R hip joint has likely overloaded  already RLE/RLQ lymphatics.  RLE/RLQ lymphedema limits Pt's ability to perform basic and instrumental ADLs, including functional ambulation, mobility and transfers, grooming , lower body bathing and dressing, skin inspection and skin care. Pt has difficulty   reaching her lower legs and feet to apply compression wraps and don/doff        compression stockings. LE limits ability to P[perform instrumental ADLs, including driving, yard work and home management activities. Lymphedema limits her ability to participate in leisure pursuits and productive activities, and it negatively impacts body image, life roles and quality of life.   Pt will benefit from an Intensive and follow along course of lymphedema. CDT will consist of Manual Lymphatic gainage   (MLD), skin care, therapeutic exercise and compression, wraps initially then garments. Pt will need assistance throughout CDT   for applying compression wraps due to limited hip AROM and decreased skin flexibility. Without skilled Occupational Therapy for lymphedema care, lymphedema will progress and further functional decline is expected.   In prep for upcoming hip replacement OT will educate Pt re assistive devices, including tub transfer bench and elevated toilet seat, in keeping with hip precautions.  OBJECTIVE IMPAIRMENTS: Abnormal gait, decreased activity tolerance, decreased balance, decreased knowledge of use of DME, decreased mobility, difficulty walking, decreased ROM, decreased strength, increased edema, decreased skin flexibility, increased fascial restrictions, impaired sensation, pain, and chronic, progressive LLE/LLQ lymphatic swelling and associated pain.   ADL LIMITATIONS: carrying, lifting, bending, sitting, standing, squatting, sleeping, stairs, transfers, bed mobility, bathing, dressing, hygiene/grooming, and productive activities, leisure pursuits, social participation, body image driving, shopping, housework, yard work, cooking, meal prep  PERSONAL FACTORS: Past/current experiences and 3+ comorbidities: Morphea Scleroderma, Mixed connective tissue disease, and osteoarthritis are also affecting patient's functional outcome.   REHAB POTENTIAL: Good  EVALUATION COMPLEXITY:  Moderate  GOALS: Goals reviewed with patient? Yes  SHORT TERM GOALS: Target date: 4th OT Rx visit   Pt will demonstrate understanding of lymphedema precautions and prevention strategies with modified independence using a printed reference to identify at least 5 precautions and discussing how s/he may implement them into daily life to reduce risk of progression with extra time.  Baseline:Max A Goal status: GOAL MET  2.  Pt will be able to apply multilayer, knee length, gradient, compression wraps to one leg at a time with modified assistance (extra time and assistive device/s) to decrease limb volume, to limit infection risk, and to limit lymphedema progression.  Baseline: Dependent Goal status: GOAL MET  LONG TERM GOALS: Target date: 01/09/24 (12 weeks)  Given this patient's Intake score 36% on the functional outcomes FOTO tool, patient will experience an increase in function of 3 points to improve basic and instrumental  ADLs performance, including lymphedema self-care.  Baseline: Max A Goal status: DEFERRED as LORALIE has been discontinued  2.  Given this patient's Intake score of  75% on the Lymphedema Life Impact Scale (LLIS), patient will experience a reduction of at least 5 points in her perceived level of functional impairment resulting from lymphedema to improve functional performance and quality of life (QOL). Baseline: % Goal status: goal not met WITH FINAL SCORE OF 70.59%  3.  Pt will achieve at least a 10% volume reduction in full, RLE limb volume to return limb to typical size and shape, to limit infection risk and LE progression, to decrease pain, to improve function. Baseline: Dependent Goal status:not met. See chart   4.  Pt will obtain proper compression garments/devices and achieve modified independence (extra time + assistive devices) with donning/doffing to optimize limb volume reductions and limit LE  progression over time. Baseline:  Goal status: 01/04/24: PARTIALLY MET;  In an effort to reduce burden of care on her spouse, Pt obtained adjustable, Velcro style R full leg, Circaid leg reduction kit for use s/p THA, but this proved not effective for controlling swelling, and were not easier to don and doff more independently. We've resumed wrapping until she is able to fit back into custom compression garments. 02/14/24: Ongoing 04/11/24: Submitted new garment measurements to DME vendor for 3 piece system to address worsening lymphedema. Pt's insurance company denied coverage. She in the process of appealing. 07/09/24: completed MEASUREMENTS FOR CUSTOM GARMENT CONVERSION FOR GARMENTS COMP 'D BY MANUFACTURER  5.  During Intensive phase CDT , with modified independence, Pt will achieve at least 85% compliance with all lymphedema self-care home program components, including daily skin care, compression wraps and /or garments, simple self MLD and lymphatic pumping therex to habituate LE self care protocol  into ADLs for optimal LE self-management over time. Baseline: Dependent Goal status: 02/14/24: GOAL MET and EXCEEDED   06/19/24: During the last month Pt has been without custom compression garments and has not been able to use sequential device while on vacation. PLAN:  OT FREQUENCY: 2-3 x/week. 07/09/24 dc OT FOR CDT  OT DURATION: 12 weeks and PRN  PLANNED INTERVENTIONS: Complete Decongestive Therapy (Intensive and supported Self-Management Phases), 97110-Therapeutic exercises, 97530- Therapeutic activity, 97535- Self Care, [id number]- Manual therapy, Patient/Family education, Taping, Manual lymph drainage, Scar mobilization, Compression bandaging, DME instructions, and skin care to reduce infection risk throughout manual therapy. Fit with replacement compression garments ASAP  PLAN FOR NEXT SESSION:  N/a   Zebedee Dec, MS, OTR/L, CLT-LANA 07/09/24 12:37 PM

## 2024-07-11 ENCOUNTER — Ambulatory Visit

## 2024-07-11 ENCOUNTER — Ambulatory Visit: Admitting: Occupational Therapy

## 2024-07-11 ENCOUNTER — Other Ambulatory Visit: Payer: Self-pay | Admitting: Psychiatry

## 2024-07-11 DIAGNOSIS — F332 Major depressive disorder, recurrent severe without psychotic features: Secondary | ICD-10-CM

## 2024-07-16 ENCOUNTER — Ambulatory Visit: Admitting: Occupational Therapy

## 2024-07-16 ENCOUNTER — Ambulatory Visit

## 2024-07-18 ENCOUNTER — Ambulatory Visit: Admitting: Occupational Therapy

## 2024-07-18 ENCOUNTER — Ambulatory Visit

## 2024-07-22 ENCOUNTER — Other Ambulatory Visit: Payer: Self-pay | Admitting: Psychiatry

## 2024-07-22 DIAGNOSIS — F4310 Post-traumatic stress disorder, unspecified: Secondary | ICD-10-CM

## 2024-07-22 DIAGNOSIS — F3341 Major depressive disorder, recurrent, in partial remission: Secondary | ICD-10-CM

## 2024-07-22 DIAGNOSIS — F411 Generalized anxiety disorder: Secondary | ICD-10-CM

## 2024-07-23 ENCOUNTER — Ambulatory Visit

## 2024-07-23 ENCOUNTER — Ambulatory Visit (INDEPENDENT_AMBULATORY_CARE_PROVIDER_SITE_OTHER): Admitting: Licensed Clinical Social Worker

## 2024-07-23 ENCOUNTER — Ambulatory Visit: Admitting: Occupational Therapy

## 2024-07-23 DIAGNOSIS — F3342 Major depressive disorder, recurrent, in full remission: Secondary | ICD-10-CM

## 2024-07-23 DIAGNOSIS — F411 Generalized anxiety disorder: Secondary | ICD-10-CM

## 2024-07-23 DIAGNOSIS — F4310 Post-traumatic stress disorder, unspecified: Secondary | ICD-10-CM

## 2024-07-23 NOTE — Progress Notes (Signed)
 THERAPIST PROGRESS NOTE  Progress Review  Virtual Visit via Video Note  I connected with Elizabeth Martin on 07/23/24 at  9:00 AM EDT by a video enabled telemedicine application and verified that I am speaking with the correct person using two identifiers.  Location: Patient: Address on file  Provider: [facility]   I discussed the limitations of evaluation and management by telemedicine and the availability of in person appointments. The patient expressed understanding and agreed to proceed.   I discussed the assessment and treatment plan with the patient. The patient was provided an opportunity to ask questions and all were answered. The patient agreed with the plan and demonstrated an understanding of the instructions.   The patient was advised to call back or seek an in-person evaluation if the symptoms worsen or if the condition fails to improve as anticipated.  I provided 42 minutes of non-face-to-face time during this encounter.   Elizabeth KATHEE Husband, LCSW   Session Time: 9-9:42pm  Participation Level: Active  Behavioral Response: CasualAlertEuthymic  Type of Therapy: Individual Therapy  Treatment Goals addressed:  Active     BH CCP Acute or Chronic Trauma Reaction     LTG: Elimination of maladaptive behaviors and thinking patterns which interfere with resolution of trauma as evidenced by self report (Completed/Met)     Start:  11/25/23    Expected End:  04/24/24    Resolved:  07/23/24    Goal Note     07/23/24: Pt reports marked improvement.         LTG: Develop and implement effective coping skills to carry out normal responsibilities and participate constructively in relationships as evidenced by self report (Completed/Met)     Start:  11/25/23    Expected End:  04/24/24    Resolved:  07/23/24    Goal Note     07/23/24: Pt reports marked improvement.         LTG: Recall traumatic events without becoming overwhelmed with negative emotions  (Completed/Met)     Start:  11/25/23    Expected End:  04/24/24    Resolved:  07/23/24      LTG: Pt reports think about these things in the past without having such an impact on my life.  (Progressing)     Start:  11/25/23    Expected End:  10/23/24       Goal Note     07/23/24: I am so close to that one that it's amazing. Shares these memories do come forward occasionally, but she is able to manage through her coping skills.          Cooperate with trauma-focused psychotherapy techniques to reduce emotional reaction to the traumatic event      Start:  11/25/23         Educate Elizabeth on common reactions to a traumatic experience     Start:  11/25/23         Assess whether Elizabeth experiences dissociative symptoms (e.g., flashbacks, memory loss, identity disorder), and treat or refer for treatment     Start:  11/25/23         Work with Elizabeth to construct a list of the situations, people, & places that [place] evoke the most distressing symptoms; suggest that they keep a journal of instances of stress being triggered     Start:  11/25/23         Teach Elizabeth coping strategies (e.g., writing down thoughts and feelings in a journal; taking deep, slow  breaths; calling a support person to talk about memories) to deal with trauma memories and sudden emotional reactions without becoming emotionally nu     Start:  11/25/23         Encourage Elizabeth Martin to identify 2 trauma related cognitive distortions     Start:  11/25/23           OP Depression     LTG: Reduce frequency, intensity, and duration of depression symptoms so that daily functioning is improved (Completed/Met)     Start:  11/25/23    Expected End:  04/24/24    Resolved:  07/23/24      STG: Elizabeth Martin will identify cognitive patterns and beliefs that support depression (Completed/Met)     Start:  11/25/23    Expected End:  04/24/24    Resolved:  07/23/24      LTG: Pt identifies the goal to Accept chronic pain and  learning coping skills  (Progressing)     Start:  11/25/23    Expected End:  10/23/24       Goal Note     07/23/24: Pt reports he is working towards this goals everyday.          Work with Elizabeth to identify the major components of a recent episode of depression: physical symptoms, major thoughts and images, and major behaviors they experienced     Start:  11/25/23         Therapist will educate patient on cognitive distortions and the rationale for treatment of depression     Start:  11/25/23         Elizabeth Martin will identify 2 cognitive distortions they are currently using and write reframing statements to replace them     Start:  11/25/23         Coping Skills      Start:  11/25/23       Will work with the pt using CBT/DBT techniques to help the pt verbalize an understanding of the cognitive, physiological, and behavioral components of depression and its treatment. This will be done by using worksheets, interactive activities, CBT/ABC thought logs, modeling, homework, role playing and journaling. Will work with pt to learn and implement coping skills that result in a reduction of depression and improve daily functioning per pt self report 3 out of 5 documented sessions.         Self Esteem:     Pt states a goal of:    07/23/24: I think I have a little bit of social anxiety but I hide it really well. She believes this involves concerns how other's think about her related to her intelligence.     LTG: Pt reports she wants to feel more confident when she speaks.  (Initial)     Start:  07/23/24    Expected End:  10/23/24      07/23/24: Pt reports she feels she is still concerned how other people judge her intellegence. Sites limitations due to age and conditions which impact her ability to find her words.     Goal Note     07/23/24: Pt reports she feels she is still concerned how other people judge her intellegence. Sites limitations due to age and conditions which impact  her ability to find her words.          CBT     Start:  07/23/24      Will Work with patient to decrease the frequency of negative self-descriptive statements and increase the frequency  of positive self- descriptive statements using CBT/DBT/REBT techniques per patient self-report 3 out of 5 documented sessions. Some of the techniques that will be used will be CBT, positive affirmations, role playing, modeling, homework and journaling.           ProgressTowards Goals: Progressing  Interventions: CBT, Strength-based, Supportive, and Reframing  Summary: Elizabeth Martin is a 63 y.o. female who presents with symptoms of depression and trauma. Patient identifies symptoms to include reexperiencing, uncontrollable worry, and hypervigilance. Pt was oriented times 5. Pt was cooperative and engaged. Pt denies SI/HI/AVH.   Reports she has been immobile due to psychical limitations.  Identifies despite recent life changes, she still maintains a positive outlook on her situation.  Cln utilized the first half of session to review patients progress. See progress notes documented above.   The clinician readministered the PHQ-9 and GAD-7 assessments. The patient's anxiety scores decreased from 7 to 3, and depression scores also decreased to 0. The patient shares she feels more like herself and is appreciating all the components of her life.  Patient states regarding the goal of improving the impact of her trauma history on her current life  I am so close to that one that it's amazing. Shares these memories do come forward occasionally, but she is able to manage through her coping skills.  Patient added a goal of addressing social anxiety and low self-esteem as a result of previous social encounters. She believes this involves concerns how other's think about her related to her intelligence.   Patient reflected on her growth and improvement throughout her therapeutic treatment.  For homework, patient was  asked to begin to document encounters with her social anxiety and patterns of overthinking social interactions.  Suicidal/Homicidal: Nowithout intent/plan  Therapist Response: Clinician used active and supportive reflection to create a safe environment for patient to process recent life stressors. Clinician assessed for current symptoms, stressors, safety since last session.  Clinician and patient reviewed patient's progress by completing the PHQ-9 and GAD-7 assessments.  Clinician and patient also reevaluated patient's treatment plan with patient noting marked improvements with her PTSD and depression.  Patient and clinician work towards identifying a goal of improving overall self-esteem specifically related to social interactions.   Plan: Return again in 4 weeks.  Diagnosis: GAD (generalized anxiety disorder)  PTSD (post-traumatic stress disorder)  MDD (major depressive disorder), recurrent, in full remission (HCC)     Collaboration of Care: [facility] psychiatrist can access notes and cln. Will review psychiatrists' notes. Check in with the patient and will see LCSW per availability. Patient agreed with treatment recommendations.   Patient/Guardian was advised Release of Information must be obtained prior to any record release in order to collaborate their care with an outside provider. Patient/Guardian was advised if they have not already done so to contact the registration department to sign all necessary forms in order for us  to release information regarding their care.   Consent: Patient/Guardian gives verbal consent for treatment and assignment of benefits for services provided during this visit. Patient/Guardian expressed understanding and agreed to proceed.   Elizabeth KATHEE Husband, LCSW 07/23/2024

## 2024-07-25 ENCOUNTER — Ambulatory Visit: Admitting: Occupational Therapy

## 2024-07-25 ENCOUNTER — Ambulatory Visit

## 2024-07-27 ENCOUNTER — Encounter: Payer: Self-pay | Admitting: Psychiatry

## 2024-07-27 ENCOUNTER — Telehealth: Admitting: Psychiatry

## 2024-07-27 DIAGNOSIS — F411 Generalized anxiety disorder: Secondary | ICD-10-CM

## 2024-07-27 DIAGNOSIS — F4310 Post-traumatic stress disorder, unspecified: Secondary | ICD-10-CM | POA: Diagnosis not present

## 2024-07-27 DIAGNOSIS — F3342 Major depressive disorder, recurrent, in full remission: Secondary | ICD-10-CM

## 2024-07-27 NOTE — Progress Notes (Signed)
 Virtual Visit via Video Note  I connected with Elizabeth Martin on 07/27/24 at  9:30 AM EDT by a video enabled telemedicine application and verified that I am speaking with the correct person using two identifiers.  Location Provider Location : [facility] Patient Location : Home  Participants: Patient , Provider    I discussed the limitations of evaluation and management by telemedicine and the availability of in person appointments. The patient expressed understanding and agreed to proceed.  I discussed the assessment and treatment plan with the patient. The patient was provided an opportunity to ask questions and all were answered. The patient agreed with the plan and demonstrated an understanding of the instructions.   The patient was advised to call back or seek an in-person evaluation if the symptoms worsen or if the condition fails to improve as anticipated.  BH MD OP Progress Note  07/27/2024 11:11 AM Elizabeth Martin  MRN:  [id number]  Chief Complaint:  Chief Complaint  Patient presents with   Depression   Anxiety   Medication Refill   Follow-up   Discussed the use of AI scribe software for clinical note transcription with the patient, who gave verbal consent to proceed.  History of Present Illness Elizabeth Martin is a 63 year old Caucasian female, retired, lives in [place], married, has a history of MDD, GAD, PTSD, mixed connective tissue disease, ANA positive, long-term use of immunosuppressant medication, history of endometrial cancer, chronic pain status post hip surgery was evaluated by telemedicine today.  She reports feeling great overall and states she feels like herself again. She reports significant improvement in her mood and functioning, and she shares that she does not feel depressed. She expresses gratitude for her current progress and support from her care team.  Ongoing work in EMDR therapy focuses on self-esteem and social anxiety. She identifies social  situations, such as preparing for monthly group gatherings with friends, as a source of anxiety. In the days leading up to these events, she experiences excessive worry, including concerns about what snack to bring and how to dress, even though she has known the group for over a year and maintains positive relationships. Once she arrives and settles in, her anxiety subsides and she feels comfortable. For the past 3 weeks, she has been mostly housebound due to a physical health flare, with limited social activity outside of her group gatherings. She continues to work with her therapist on strategies to address social anxiety and does not feel the need for as-needed medication for anxiety at this time.  Her current medication regimen includes venlafaxine  75 mg, bupropion  150 mg daily, and trazodone  50 mg at bedtime as needed. She reports that she is no longer taking buspirone  and has not started gabapentin  prescribed by her other provider. She uses trazodone  only as needed and has an adequate supply. She denies any thoughts of hurting herself or others.  She lives with her husband. She organizes and attends a monthly game night with a group of friends and participates in additional social gatherings with the group, including weekends away and swimming. She reports being a member of the [employer] with her husband but has not attended recently.     Visit Diagnosis: R/O social anxiety disorder   ICD-10-CM   1. Recurrent major depressive disorder, in full remission (HCC)  F33.42     2. PTSD (post-traumatic stress disorder)  F43.10     3. GAD (generalized anxiety disorder)  F41.1       Past  Psychiatric History: I have reviewed past psychiatric history from progress note on 11/10/2023.  Past trials of Cymbalta .  Past Medical History:  Past Medical History:  Diagnosis Date   Anxiety    Asthma    Autoimmune disease (HCC)    Cancer (HCC)    Endometrial lymph node removal (30)   Corneal abrasion, right  05/24/2022   Depression    GERD (gastroesophageal reflux disease)    H/O blood clots    upper rt groin and behind both knees   History of hiatal hernia    Hypertension    Lymphedema    right leg   Morphea scleroderma    PONV (postoperative nausea and vomiting)    states gets violently ill    Past Surgical History:  Procedure Laterality Date   BASAL CELL CARCINOMA EXCISION Left    CHOLECYSTECTOMY  2008   FLEXIBLE SIGMOIDOSCOPY N/A 05/19/2022   Procedure: FLEXIBLE SIGMOIDOSCOPY;  Surgeon: Toledo, Ladell POUR, MD;  Location: [facility];  Service: Gastroenterology;  Laterality: N/A;   HERNIA REPAIR  2004   ILEOSTOMY CLOSURE N/A 08/24/2022   Procedure: ILEOSTOMY TAKEDOWN, open loop with Arthea Platt, PA-C to assist;  Surgeon: Desiderio Schanz, MD;  Location: [facility];  Service: General;  Laterality: N/A;   IVC FILTER INSERTION N/A 11/09/2023   Procedure: IVC FILTER INSERTION;  Surgeon: Marea Selinda RAMAN, MD;  Location: [facility];  Service: Cardiovascular;  Laterality: N/A;   IVC FILTER REMOVAL N/A 01/02/2024   Procedure: IVC FILTER REMOVAL;  Surgeon: Marea Selinda RAMAN, MD;  Location: [facility];  Service: Cardiovascular;  Laterality: N/A;   PLANTAR FASCIECTOMY     ROBOTIC ASSISTED TOTAL HYSTERECTOMY WITH BILATERAL SALPINGO OOPHERECTOMY  02/14/2012   ROTATOR CUFF REPAIR Left 03/24/2022   TONSILLECTOMY     TOTAL HIP ARTHROPLASTY Right 11/14/2023   Procedure: TOTAL HIP ARTHROPLASTY - posterior;  Surgeon: Lorelle Arthea, MD;  Location: [facility];  Service: Orthopedics;  Laterality: Right;   XI ROBOTIC ASSISTED LOWER ANTERIOR RESECTION N/A 05/24/2022   Procedure: XI ROBOTIC ASSISTED LOWER ANTERIOR RESECTION;  Surgeon: Desiderio Schanz, MD;  Location: [facility];  Service: General;  Laterality: N/A;    Family Psychiatric History: I have reviewed family psychiatric history from progress note on 11/10/2023.  Family History:  Family History  Problem Relation Age of Onset   Mental illness  Neg Hx     Social History: I have reviewed social history from progress note on 11/10/2023. Social History   Socioeconomic History   Marital status: Married    Spouse name: Not on file   Number of children: Not on file   Years of education: Not on file   Highest education level: Not on file  Occupational History   Not on file  Tobacco Use   Smoking status: Never   Smokeless tobacco: Never  Vaping Use   Vaping status: Never Used  Substance and Sexual Activity   Alcohol use: Yes   Drug use: Never   Sexual activity: Not Currently  Other Topics Concern   Not on file  Social History Narrative   ** Merged History Encounter **       Social Drivers of Health   Financial Resource Strain: Low Risk  (08/20/2023)   Received from [facility]   Overall Financial Resource Strain (CARDIA)    Difficulty of Paying Living Expenses: Not hard at all  Food Insecurity: No Food Insecurity (11/14/2023)   Hunger Vital Sign    Worried About  Running Out of Food in the Last Year: Never true    Ran Out of Food in the Last Year: Never true  Transportation Needs: No Transportation Needs (11/14/2023)   PRAPARE - Administrator, Civil Service (Medical): No    Lack of Transportation (Non-Medical): No  Physical Activity: Not on file  Stress: Not on file  Social Connections: Unknown (02/01/2023)   Received from [facility]   Social Network    Social Network: Not on file    Allergies:  Allergies  Allergen Reactions   Carboplatin Anaphylaxis        Hydromorphone  Anaphylaxis    Stopped breathing  Respiratory arrest - due to dosage administered per the patient.   Latex Hives, Itching and Rash   Nitrofurantoin Nausea And Vomiting   Silicone Itching and Rash   Codeine Nausea And Vomiting   Other Nausea And Vomiting    Any codone medications Violently sick even with zofran  per patient report    Wheat Other (See Comments)    Body aches and swelling    Oxycodone  Nausea And  Vomiting    Metabolic Disorder Labs: No results found for: HGBA1C, MPG No results found for: PROLACTIN No results found for: CHOL, TRIG, HDL, CHOLHDL, VLDL, LDLCALC Lab Results  Component Value Date   TSH 2.066 12/21/2023    Therapeutic Level Labs: No results found for: LITHIUM No results found for: VALPROATE No results found for: CBMZ  Current Medications: Current Outpatient Medications  Medication Sig Dispense Refill   gabapentin  (NEURONTIN ) 100 MG capsule Take 100 mg by mouth 3 (three) times daily.     predniSONE (DELTASONE) 10 MG tablet Take by mouth.     acetaminophen  (TYLENOL ) 500 MG tablet Take 2 tablets (1,000 mg total) by mouth every 8 (eight) hours. 30 tablet 0   albuterol  (VENTOLIN  HFA) 108 (90 Base) MCG/ACT inhaler Inhale 2 puffs into the lungs every 6 (six) hours as needed for shortness of breath or wheezing.     Alum Hydroxide-Mag Carbonate (GAVISCON PO) Take 4 tablets by mouth daily as needed (upset stomach).     budesonide-formoterol (SYMBICORT) 80-4.5 MCG/ACT inhaler Inhale 2 puffs into the lungs every 4 (four) hours as needed (Shortness of breath).     buPROPion  (WELLBUTRIN  XL) 150 MG 24 hr tablet TAKE 1 TABLET (150 MG TOTAL) BY MOUTH IN THE MORNING 90 tablet 0   celecoxib  (CELEBREX ) 200 MG capsule Take 200 mg by mouth.     cephALEXin  (KEFLEX ) 500 MG capsule Take 500 mg by mouth 2 (two) times daily.     cetirizine (ZYRTEC) 10 MG tablet Take 10 mg by mouth daily.     CLOBETASOL PROPIONATE E 0.05 % emollient cream Apply topically.     desonide (DESOWEN) 0.05 % cream Apply 1 Application topically 2 (two) times daily as needed (morphea flare).     famotidine -calcium carbonate-magnesium  hydroxide (PEPCID  COMPLETE) 10-800-165 MG chewable tablet Chew 1 tablet by mouth daily as needed (heartburn).     folic acid (FOLVITE) 1 MG tablet Take 1 mg by mouth daily.     hydrocortisone-pramoxine (ANALPRAM-HC) 2.5-1 % rectal cream Place 1 Application  rectally 3 (three) times daily as needed for hemorrhoids.     hydroxychloroquine  (PLAQUENIL ) 200 MG tablet Take 200 mg by mouth 2 (two) times daily.     lisinopril  (ZESTRIL ) 40 MG tablet Take 40 mg by mouth daily.     lubiprostone (AMITIZA) 24 MCG capsule Take by mouth.     methotrexate (RHEUMATREX)  2.5 MG tablet Take 10 mg by mouth 2 (two) times a week. Take 10 mg on Monday and Tuesday     omeprazole (PRILOSEC) 40 MG capsule Take 40 mg by mouth daily.     ondansetron  (ZOFRAN -ODT) 4 MG disintegrating tablet Take 4 mg by mouth every 8 (eight) hours as needed for nausea or vomiting.     phenazopyridine (PYRIDIUM) 100 MG tablet Take 100 mg by mouth 3 (three) times daily as needed.     polyethylene glycol (MIRALAX  / GLYCOLAX ) 17 g packet Take 17 g by mouth.     pramoxine (SARNA SENSITIVE) 1 % LOTN Apply topically.     promethazine  (PHENERGAN ) 12.5 MG tablet Take 12.5 mg by mouth every 8 (eight) hours as needed.     scopolamine  (TRANSDERM-SCOP) 1 MG/3DAYS Place 1 mg onto the skin.     silver sulfADIAZINE (SILVADENE) 1 % cream Apply 1 Application topically daily.     traZODone  (DESYREL ) 50 MG tablet TAKE 1 TABLET BY MOUTH AT BEDTIME AS NEEDED FOR SLEEP. 90 tablet 0   triamcinolone (KENALOG) 0.025 % cream Apply 1 Application topically 2 (two) times daily as needed (morphea flare).     trospium  (SANCTURA ) 20 MG tablet Take 1 tablet (20 mg total) by mouth 2 (two) times daily as needed (overactive bladder). 180 tablet 1   venlafaxine  XR (EFFEXOR -XR) 75 MG 24 hr capsule Take 1 capsule (75 mg total) by mouth daily with breakfast. 90 capsule 0   No current facility-administered medications for this visit.     Musculoskeletal: Strength & Muscle Tone: UTA Gait & Station: Seated Patient leans: N/A  Psychiatric Specialty Exam: Review of Systems  Psychiatric/Behavioral:  The patient is nervous/anxious.     There were no vitals taken for this visit.There is no height or weight on file to calculate BMI.   General Appearance: Casual  Eye Contact:  Fair  Speech:  Normal Rate  Volume:  Normal  Mood:  Anxious  Affect:  Appropriate  Thought Process:  Goal Directed and Descriptions of Associations: Intact  Orientation:  Full (Time, Place, and Person)  Thought Content: Logical   Suicidal Thoughts:  No  Homicidal Thoughts:  No  Memory:  Immediate;   Fair Recent;   Fair Remote;   Fair  Judgement:  Fair  Insight:  Good  Psychomotor Activity:  Normal  Concentration:  Concentration: Fair and Attention Span: Fair  Recall:  [employer] of Knowledge: Fair  Language: Fair  Akathisia:  No  Handed:  Right  AIMS (if indicated): not done  Assets:  Communication Skills Desire for Improvement Housing Social Support  ADL's:  Intact  Cognition: WNL  Sleep:  Fair   Screenings: GAD-7    Advertising copywriter from 07/23/2024 in [facility] Office Visit from 04/04/2024 in [facility] Counselor from 03/20/2024 in [facility] Counselor from 11/25/2023 in [facility] Office Visit from 11/10/2023 in [facility]  Total GAD-7 Score 3 7 3 20 17    PHQ2-9    Flowsheet Row Counselor from 07/23/2024 in [facility] Office Visit from 04/04/2024 in [facility] Counselor from 03/20/2024 in [facility] Counselor from 11/25/2023 in [facility] Office Visit from 11/10/2023 in [facility]  PHQ-2 Total Score 0 1 2 6  5  PHQ-9 Total Score -- 10 5 23 21    Flowsheet Row Video Visit from 07/27/2024 in [facility] Counselor from 07/23/2024 in [facility] Video Visit from 06/19/2024 in [facility]  C-SSRS RISK CATEGORY No Risk No Risk No Risk     Assessment and Plan: Elizabeth Martin is a 63 year old Caucasian female with history of depression, anxiety was evaluated by telemedicine today.  Discussed assessment and plan as noted below.  Generalized anxiety disorder-improving PTSD-improving MDD in remission Currently reports overall mood symptoms as improved although she does have anticipatory anxiety about going in for monthly social gatherings with her friends.  Currently working with her therapist on the same. Continue EMDR therapy with Ms. Evalene Husband Continue Venlafaxine  extended release 75 mg daily Continue Trazodone  50 mg at bedtime Continue Wellbutrin  150 mg daily Discontinue BuSpar , was tapered off.  Follow-up Follow-up in clinic in 3 months or sooner in person.   Collaboration of Care: Collaboration of Care: Referral or follow-up with counselor/therapist AEB encouraged to continue psychotherapy sessions.  Reviewed notes per Ms. Perkins dated 07/23/2024.  Patient currently engaged in trauma focused therapy.  Patient/Guardian was advised Release of Information must be obtained prior to any record release in order to collaborate their care with an outside provider. Patient/Guardian was advised if they have not already done so to contact the registration department to sign all necessary forms in order for us  to release information regarding their care.   Consent: Patient/Guardian gives verbal consent for treatment and assignment of benefits for services provided during this visit. Patient/Guardian expressed understanding and agreed to proceed.   This note was generated in part or whole with voice recognition software. Voice recognition is usually quite accurate but there are transcription errors that can and very often do occur. I apologize for any typographical errors that were  not detected and corrected.    Dondrea Clendenin, MD 07/28/2024, 8:36 AM

## 2024-07-30 ENCOUNTER — Ambulatory Visit: Admitting: Occupational Therapy

## 2024-07-30 ENCOUNTER — Ambulatory Visit

## 2024-08-01 ENCOUNTER — Ambulatory Visit: Admitting: Occupational Therapy

## 2024-08-01 ENCOUNTER — Ambulatory Visit

## 2024-08-08 ENCOUNTER — Ambulatory Visit

## 2024-08-08 ENCOUNTER — Ambulatory Visit: Admitting: Occupational Therapy

## 2024-08-13 ENCOUNTER — Ambulatory Visit

## 2024-08-13 ENCOUNTER — Ambulatory Visit: Admitting: Occupational Therapy

## 2024-08-14 ENCOUNTER — Ambulatory Visit: Admitting: Licensed Clinical Social Worker

## 2024-08-14 DIAGNOSIS — F411 Generalized anxiety disorder: Secondary | ICD-10-CM

## 2024-08-14 DIAGNOSIS — F4310 Post-traumatic stress disorder, unspecified: Secondary | ICD-10-CM

## 2024-08-14 DIAGNOSIS — F3342 Major depressive disorder, recurrent, in full remission: Secondary | ICD-10-CM | POA: Diagnosis not present

## 2024-08-14 DIAGNOSIS — H10022 Other mucopurulent conjunctivitis, left eye: Secondary | ICD-10-CM | POA: Insufficient documentation

## 2024-08-14 NOTE — Progress Notes (Signed)
 THERAPIST PROGRESS NOTE  Session Time: 11:01-11:55am  Participation Level: Active  Behavioral Response: CasualAlertEuthymic  Type of Therapy: Individual Therapy  Treatment Goals addressed:  Active     BH CCP Acute or Chronic Trauma Reaction     LTG: Elimination of maladaptive behaviors and thinking patterns which interfere with resolution of trauma as evidenced by self report (Completed/Met)     Start:  11/25/23    Expected End:  04/24/24    Resolved:  07/23/24    Goal Note     07/23/24: Pt reports marked improvement.         LTG: Develop and implement effective coping skills to carry out normal responsibilities and participate constructively in relationships as evidenced by self report (Completed/Met)     Start:  11/25/23    Expected End:  04/24/24    Resolved:  07/23/24    Goal Note     07/23/24: Pt reports marked improvement.         LTG: Recall traumatic events without becoming overwhelmed with negative emotions (Completed/Met)     Start:  11/25/23    Expected End:  04/24/24    Resolved:  07/23/24      LTG: Pt reports think about these things in the past without having such an impact on my life.  (Progressing)     Start:  11/25/23    Expected End:  10/23/24       Goal Note     07/23/24: I am so close to that one that it's amazing. Shares these memories do come forward occasionally, but she is able to manage through her coping skills.          Cooperate with trauma-focused psychotherapy techniques to reduce emotional reaction to the traumatic event      Start:  11/25/23         Educate Erminio on common reactions to a traumatic experience     Start:  11/25/23         Assess whether Erminio experiences dissociative symptoms (e.g., flashbacks, memory loss, identity disorder), and treat or refer for treatment     Start:  11/25/23         Work with Erminio to construct a list of the situations, people, & places that Bertha evoke the most  distressing symptoms; suggest that they keep a journal of instances of stress being triggered     Start:  11/25/23         Teach Erminio coping strategies (e.g., writing down thoughts and feelings in a journal; taking deep, slow breaths; calling a support person to talk about memories) to deal with trauma memories and sudden emotional reactions without becoming emotionally nu     Start:  11/25/23         Encourage Erminio to identify 2 trauma related cognitive distortions     Start:  11/25/23           OP Depression     LTG: Reduce frequency, intensity, and duration of depression symptoms so that daily functioning is improved (Completed/Met)     Start:  11/25/23    Expected End:  04/24/24    Resolved:  07/23/24      STG: Philamena will identify cognitive patterns and beliefs that support depression (Completed/Met)     Start:  11/25/23    Expected End:  04/24/24    Resolved:  07/23/24      LTG: Pt identifies the goal to Accept chronic pain and learning coping skills  (  Progressing)     Start:  11/25/23    Expected End:  10/23/24       Goal Note     07/23/24: Pt reports he is working towards this goals everyday.          Work with Erminio to identify the major components of a recent episode of depression: physical symptoms, major thoughts and images, and major behaviors they experienced     Start:  11/25/23         Therapist will educate patient on cognitive distortions and the rationale for treatment of depression     Start:  11/25/23         Imoni will identify 2 cognitive distortions they are currently using and write reframing statements to replace them     Start:  11/25/23         Coping Skills      Start:  11/25/23       Will work with the pt using CBT/DBT techniques to help the pt verbalize an understanding of the cognitive, physiological, and behavioral components of depression and its treatment. This will be done by using worksheets, interactive activities,  CBT/ABC thought logs, modeling, homework, role playing and journaling. Will work with pt to learn and implement coping skills that result in a reduction of depression and improve daily functioning per pt self report 3 out of 5 documented sessions.         Self Esteem:     Pt states a goal of:    07/23/24: I think I have a little bit of social anxiety but I hide it really well. She believes this involves concerns how other's think about her related to her intelligence.     LTG: Pt reports she wants to feel more confident when she speaks.  (Initial)     Start:  07/23/24    Expected End:  10/23/24      07/23/24: Pt reports she feels she is still concerned how other people judge her intellegence. Sites limitations due to age and conditions which impact her ability to find her words.     Goal Note     07/23/24: Pt reports she feels she is still concerned how other people judge her intellegence. Sites limitations due to age and conditions which impact her ability to find her words.          CBT     Start:  07/23/24      Will Work with patient to decrease the frequency of negative self-descriptive statements and increase the frequency of positive self- descriptive statements using CBT/DBT/REBT techniques per patient self-report 3 out of 5 documented sessions. Some of the techniques that will be used will be CBT, positive affirmations, role playing, modeling, homework and journaling.           ProgressTowards Goals: Progressing  Interventions: CBT, Assertiveness Training, Supportive, and Reframing  Summary: Elizabeth Martin is a 63 y.o. female who presents with symptoms of depression and trauma. Patient identifies symptoms to include reexperiencing, uncontrollable worry, and hypervigilance. Pt was oriented times 5. Pt was cooperative and engaged. Pt denies SI/HI/AVH.   The patient presented as tearful and overwhelmed, citing chronic pain and an inability to accept her physical disability  as barriers to her progress in her mental health journey. She reported struggling with grieving the loss of independence and her expectations for what her life would look like post-retirement. The patient feels she never had the opportunity to learn and understand her capabilities,  as she faced a significant setback when her physical ailments began.  She began to disclose that she constantly modifies her environment due to feelings of inadequacy. The patient also connected her trauma history to current disagreements with her significant other. The clinician assessed how the patient has attempted to engage in communication so far. The patient indicated that she is comparing previous relationship dynamics with her current relationship, prompting the clinician to encourage her to refrain from doing so.  Additionally, the clinician explored the patient's understanding of couples therapy and her feelings about possibly pursuing it as an option for her and her husband. Through the conversation, the patient expressed a desire to discuss the possibility of couples therapy with her husband.  The session concluded with the patient celebrating a positive social interaction she had with a group of friends. She noted that her fears and anxieties about acceptance within the group have subsided as a result of these positive exchanges.  Suicidal/Homicidal: Nowithout intent/plan  Therapist Response: Clinician used active and supportive reflection to create a safe environment for patient to process recent life stressors. Clinician assessed for current symptoms, stressors, safety since last session.  Clinician worked with patient through CBT to reframe negative cognitions around misplaced guilt regarding miscommunications within her marriage.  Clinician also worked with patient to identify ways in which she can utilize assertive communication to continue to communicate her emotions while also identifying possible solutions  to furthering healthy communication within the relationship.  Plan: Return again in 4 weeks.  Diagnosis: GAD (generalized anxiety disorder)  PTSD (post-traumatic stress disorder)  Recurrent major depressive disorder, in full remission (HCC)\  Collaboration of Care: [facility] psychiatrist can access notes and cln. Will review psychiatrists' notes. Check in with the patient and will see LCSW per availability. Patient agreed with treatment recommendations.   Patient/Guardian was advised Release of Information must be obtained prior to any record release in order to collaborate their care with an outside provider. Patient/Guardian was advised if they have not already done so to contact the registration department to sign all necessary forms in order for us  to release information regarding their care.   Consent: Patient/Guardian gives verbal consent for treatment and assignment of benefits for services provided during this visit. Patient/Guardian expressed understanding and agreed to proceed.   [name], LCSW 08/14/2024

## 2024-08-15 ENCOUNTER — Ambulatory Visit

## 2024-08-15 ENCOUNTER — Ambulatory Visit: Admitting: Occupational Therapy

## 2024-08-20 ENCOUNTER — Ambulatory Visit: Admitting: Occupational Therapy

## 2024-08-20 ENCOUNTER — Ambulatory Visit

## 2024-08-21 ENCOUNTER — Ambulatory Visit: Admitting: Licensed Clinical Social Worker

## 2024-08-22 ENCOUNTER — Ambulatory Visit

## 2024-08-22 ENCOUNTER — Ambulatory Visit: Admitting: Occupational Therapy

## 2024-08-27 ENCOUNTER — Ambulatory Visit

## 2024-08-27 ENCOUNTER — Ambulatory Visit: Admitting: Occupational Therapy

## 2024-08-29 ENCOUNTER — Ambulatory Visit: Admitting: Occupational Therapy

## 2024-08-29 ENCOUNTER — Ambulatory Visit

## 2024-09-03 ENCOUNTER — Ambulatory Visit

## 2024-09-03 ENCOUNTER — Ambulatory Visit: Admitting: Occupational Therapy

## 2024-09-04 DIAGNOSIS — M545 Low back pain, unspecified: Secondary | ICD-10-CM | POA: Insufficient documentation

## 2024-09-04 DIAGNOSIS — K1370 Unspecified lesions of oral mucosa: Secondary | ICD-10-CM | POA: Insufficient documentation

## 2024-09-04 NOTE — Progress Notes (Unsigned)
 PROVIDER NOTE: Interpretation of information contained herein should be left to medically-trained personnel. Specific patient instructions are provided elsewhere under Patient Instructions section of medical record. This document was created in part using AI and STT-dictation technology, any transcriptional errors that may result from this process are unintentional.  Patient: Elizabeth Martin  Service: E/M Encounter  Provider: Eric DELENA Como, MD  DOB: December 23, 1960  Delivery: Face-to-face  Specialty: Interventional Pain Management  MRN: [id number]  Setting: Ambulatory outpatient facility  Specialty designation: 09  Type: New Patient  Location: Outpatient office facility  PCP: Tobie Border, MD  DOS: 09/05/2024    Referring Prov.: Tobie Border, MD   Primary Reason(s) for Visit: Encounter for initial evaluation of one or more chronic problems (new to examiner) potentially causing chronic pain, and posing a threat to normal musculoskeletal function. (Level of risk: High) CC: No chief complaint on file.  HPI  Elizabeth Martin is a 63 y.o. year old, female patient, who comes for the first time to our practice referred by Tobie Border, MD for our initial evaluation of her chronic pain. She has Hypokalemia; Abdominal pain; Asthma; Gastroesophageal reflux disease; Essential hypertension; Colonic stricture (HCC); Large bowel obstruction (HCC); S/P closure of ileostomy; Ileostomy in place [facility]); Small bowel obstruction (HCC); Raynaud disease; Polyarthralgia; Exposure to severe acute respiratory syndrome coronavirus 2 (SARS-CoV-2); Diarrhea; Diverticular disease; Dyspnea; Edema; Edema of lower extremity; Elevated testosterone level in female; Endometrial cancer (HCC); Fatigue; Gastroenteritis; Adhesive capsulitis of left shoulder; Glenohumeral arthritis, left; Hair loss; Hashimoto thyroiditis, fibrous variant; Hashimoto's disease; History of endometrial cancer; Hot flashes; Hyperlipidemia; Lymphedema; Malignant  neoplastic disease (HCC); Menopause; Microscopic hematuria; Migraine without aura and without status migrainosus, not intractable; Mixed connective tissue disease; Morbid obesity (HCC); Morphea; Myofascial pain; Neck pain; Osteoarthritis of left knee; Otorrhagia of right ear; Pain of right lower extremity; Paresthesia of lower extremity; Patellofemoral pain syndrome of right knee; Pre-operative clearance; Primary insomnia; Rash; Right shoulder pain; [facility] spotted fever; Sleep disorder; Strain of gastrocnemius tendon; Stucco keratoses; Thyroid  nodule; Lower abdominal pain; Pain in pelvis; Upper back pain; Benign hypertension; Multiple joint pain; Colon cancer screening; Chronic idiopathic constipation; Cervical radiculopathy; Cervical facet joint syndrome; Cellulitis; Blood coagulation disorder; Basal cell carcinoma of skin; Anxiety and depression; Anti-TPO antibodies present; Adenomatous colon polyp; Chronic lower back pain; Acute bilateral low back pain without sciatica; PTSD (post-traumatic stress disorder); Severe episode of recurrent major depressive disorder, without psychotic features (HCC); GAD (generalized anxiety disorder); High risk medication use; S/P total right hip arthroplasty; Constipation; Nausea & vomiting; and Recurrent major depressive disorder, in full remission on their problem list. Today she comes in for evaluation of her No chief complaint on file.  Pain Assessment: Location:     Radiating:   Onset:   Duration:   Quality:   Severity:  /10 (subjective, self-reported pain score)  Effect on ADL:   Timing:   Modifying factors:   BP:    HR:    Onset and Duration: {Hx; Onset and Duration:210120511} Cause of pain: {Hx; Cause:210120521} Severity: {Pain Severity:210120502} Timing: {Symptoms; Timing:210120501} Aggravating Factors: {Causes; Aggravating pain factors:210120507} Alleviating Factors: {Causes; Alleviating Factors:210120500} Associated Problems: {Hx; Associated  problems:210120515} Quality of Pain: {Hx; Symptom quality or Descriptor:210120531} Previous Examinations or Tests: {Hx; Previous examinations or test:210120529} Previous Treatments: {Hx; Previous Treatment:210120503}  Elizabeth Martin is being evaluated for possible interventional pain management therapies for the treatment of her chronic pain.  Discussed the use of AI scribe software for clinical note transcription with the patient, who gave verbal  consent to proceed.  History of Present Illness            Elizabeth Martin has been informed that this initial visit was an evaluation only.  On the follow up appointment I will go over the results, including ordered tests and available interventional therapies. At that time she will have the opportunity to decide whether to proceed with offered therapies or not. In the event that Elizabeth Martin prefers avoiding interventional options, this will conclude our involvement in the case.  Medication management recommendations may be provided upon request.  Patient informed that diagnostic tests may be ordered to assist in identifying underlying causes, narrow the list of differential diagnoses and aid in determining candidacy for (or contraindications to) planned therapeutic interventions.  Historic Controlled Substance Pharmacotherapy Review PMP and historical list of controlled substances: ***  Most recently prescribed controlled substance(s): Opioid Analgesic: *** MME/day: *** mg/day  Historical Monitoring: The patient  reports no history of drug use. List of prior UDS Testing: No results found for: MDMA, COCAINSCRNUR, PCPSCRNUR, PCPQUANT, CANNABQUANT, THCU, ETH, CBDTHCR, D8THCCBX, D9THCCBX Historical Background Evaluation: [place] PMP: PDMP reviewed during this encounter. Review of the past 40-months conducted.             PMP NARX Score Report:  Narcotic: *** Sedative: *** Stimulant: *** [place] Department of public safety, offender search:  Engineer, mining Information) Non-contributory Risk Assessment Profile: Aberrant behavior: None observed or detected today Risk factors for fatal opioid overdose: None identified today PMP NARX Overdose Risk Score: *** Fatal overdose hazard ratio (HR): Calculation deferred Non-fatal overdose hazard ratio (HR): Calculation deferred Risk of opioid abuse or dependence: 0.7-3.0% with doses <= 36 MME/day and 6.1-26% with doses >= 120 MME/day. Substance use disorder (SUD) risk level: See below Personal History of Substance Abuse (SUD-Substance use disorder):  Alcohol:    Illegal Drugs:    Rx Drugs:    ORT Risk Level calculation:    ORT Scoring interpretation table:  Score <3 = Low Risk for SUD  Score between 4-7 = Moderate Risk for SUD  Score >8 = High Risk for Opioid Abuse   PHQ-2 Depression Scale:  Total score:    PHQ-2 Scoring interpretation table: (Score and probability of major depressive disorder)  Score 0 = No depression  Score 1 = 15.4% Probability  Score 2 = 21.1% Probability  Score 3 = 38.4% Probability  Score 4 = 45.5% Probability  Score 5 = 56.4% Probability  Score 6 = 78.6% Probability   PHQ-9 Depression Scale:  Total score:    PHQ-9 Scoring interpretation table:  Score 0-4 = No depression  Score 5-9 = Mild depression  Score 10-14 = Moderate depression  Score 15-19 = Moderately severe depression  Score 20-27 = Severe depression (2.4 times higher risk of SUD and 2.89 times higher risk of overuse)   Pharmacologic Plan: As per protocol, I have not taken over any controlled substance management, pending the results of ordered tests and/or consults.            Initial impression: Pending review of available data and ordered tests.  Meds   Current Outpatient Medications:    acetaminophen  (TYLENOL ) 500 MG tablet, Take 2 tablets (1,000 mg total) by mouth every 8 (eight) hours., Disp: 30 tablet, Rfl: 0   albuterol  (VENTOLIN  HFA) 108 (90 Base) MCG/ACT inhaler, Inhale 2 puffs into  the lungs every 6 (six) hours as needed for shortness of breath or wheezing., Disp: , Rfl:    Alum Hydroxide-Mag Carbonate (  GAVISCON PO), Take 4 tablets by mouth daily as needed (upset stomach)., Disp: , Rfl:    budesonide-formoterol (SYMBICORT) 80-4.5 MCG/ACT inhaler, Inhale 2 puffs into the lungs every 4 (four) hours as needed (Shortness of breath)., Disp: , Rfl:    buPROPion  (WELLBUTRIN  XL) 150 MG 24 hr tablet, TAKE 1 TABLET (150 MG TOTAL) BY MOUTH IN THE MORNING, Disp: 90 tablet, Rfl: 0   celecoxib  (CELEBREX ) 200 MG capsule, Take 200 mg by mouth., Disp: , Rfl:    cephALEXin  (KEFLEX ) 500 MG capsule, Take 500 mg by mouth 2 (two) times daily., Disp: , Rfl:    cetirizine (ZYRTEC) 10 MG tablet, Take 10 mg by mouth daily., Disp: , Rfl:    CLOBETASOL PROPIONATE E 0.05 % emollient cream, Apply topically., Disp: , Rfl:    desonide (DESOWEN) 0.05 % cream, Apply 1 Application topically 2 (two) times daily as needed (morphea flare)., Disp: , Rfl:    famotidine -calcium carbonate-magnesium  hydroxide (PEPCID  COMPLETE) 10-800-165 MG chewable tablet, Chew 1 tablet by mouth daily as needed (heartburn)., Disp: , Rfl:    folic acid (FOLVITE) 1 MG tablet, Take 1 mg by mouth daily., Disp: , Rfl:    gabapentin  (NEURONTIN ) 100 MG capsule, Take 100 mg by mouth 3 (three) times daily., Disp: , Rfl:    hydrocortisone-pramoxine (ANALPRAM-HC) 2.5-1 % rectal cream, Place 1 Application rectally 3 (three) times daily as needed for hemorrhoids., Disp: , Rfl:    hydroxychloroquine  (PLAQUENIL ) 200 MG tablet, Take 200 mg by mouth 2 (two) times daily., Disp: , Rfl:    lisinopril  (ZESTRIL ) 40 MG tablet, Take 40 mg by mouth daily., Disp: , Rfl:    lubiprostone (AMITIZA) 24 MCG capsule, Take by mouth., Disp: , Rfl:    methotrexate (RHEUMATREX) 2.5 MG tablet, Take 10 mg by mouth 2 (two) times a week. Take 10 mg on Monday and Tuesday, Disp: , Rfl:    omeprazole (PRILOSEC) 40 MG capsule, Take 40 mg by mouth daily., Disp: , Rfl:     ondansetron  (ZOFRAN -ODT) 4 MG disintegrating tablet, Take 4 mg by mouth every 8 (eight) hours as needed for nausea or vomiting., Disp: , Rfl:    phenazopyridine (PYRIDIUM) 100 MG tablet, Take 100 mg by mouth 3 (three) times daily as needed., Disp: , Rfl:    polyethylene glycol (MIRALAX  / GLYCOLAX ) 17 g packet, Take 17 g by mouth., Disp: , Rfl:    pramoxine (SARNA SENSITIVE) 1 % LOTN, Apply topically., Disp: , Rfl:    predniSONE (DELTASONE) 10 MG tablet, Take by mouth., Disp: , Rfl:    promethazine  (PHENERGAN ) 12.5 MG tablet, Take 12.5 mg by mouth every 8 (eight) hours as needed., Disp: , Rfl:    scopolamine  (TRANSDERM-SCOP) 1 MG/3DAYS, Place 1 mg onto the skin., Disp: , Rfl:    silver sulfADIAZINE (SILVADENE) 1 % cream, Apply 1 Application topically daily., Disp: , Rfl:    traZODone  (DESYREL ) 50 MG tablet, TAKE 1 TABLET BY MOUTH AT BEDTIME AS NEEDED FOR SLEEP., Disp: 90 tablet, Rfl: 0   triamcinolone (KENALOG) 0.025 % cream, Apply 1 Application topically 2 (two) times daily as needed (morphea flare)., Disp: , Rfl:    trospium  (SANCTURA ) 20 MG tablet, Take 1 tablet (20 mg total) by mouth 2 (two) times daily as needed (overactive bladder)., Disp: 180 tablet, Rfl: 1   venlafaxine  XR (EFFEXOR -XR) 75 MG 24 hr capsule, Take 1 capsule (75 mg total) by mouth daily with breakfast., Disp: 90 capsule, Rfl: 0  Imaging Review  Cervical Imaging: Cervical  MR wo contrast: No results found for this or any previous visit.  Cervical MR wo contrast: No results found for this or any previous visit.  Cervical MR w/wo contrast: No results found for this or any previous visit.  Cervical MR w contrast: No results found for this or any previous visit.  Cervical CT wo contrast: No results found for this or any previous visit.  Cervical CT w/wo contrast: No results found for this or any previous visit.  Cervical CT w/wo contrast: No results found for this or any previous visit.  Cervical CT w contrast: No results  found for this or any previous visit.  Cervical CT outside: No results found for this or any previous visit.  Cervical DG 1 view: No results found for this or any previous visit.  Cervical DG 2-3 views: No results found for this or any previous visit.  Cervical DG F/E views: No results found for this or any previous visit.  Cervical DG 2-3 clearing views: No results found for this or any previous visit.  Cervical DG Bending/F/E views: No results found for this or any previous visit.  Cervical DG complete: No results found for this or any previous visit.  Cervical DG Myelogram views: No results found for this or any previous visit.  Cervical DG Myelogram views: No results found for this or any previous visit.  Cervical Discogram views: No results found for this or any previous visit.   Shoulder Imaging: Shoulder-R MR w contrast: No results found for this or any previous visit.  Shoulder-L MR w contrast: No results found for this or any previous visit.  Shoulder-R MR w/wo contrast: No results found for this or any previous visit.  Shoulder-L MR w/wo contrast: No results found for this or any previous visit.  Shoulder-R MR wo contrast: No results found for this or any previous visit.  Shoulder-L MR wo contrast: Results for orders placed during the hospital encounter of 12/14/21  MR SHOULDER LEFT WO CONTRAST  Narrative CLINICAL DATA:  Left shoulder pain for 6 months.  EXAM: MRI OF THE LEFT SHOULDER WITHOUT CONTRAST  TECHNIQUE: Multiplanar, multisequence MR imaging of the shoulder was performed. No intravenous contrast was administered.  COMPARISON:  None.  FINDINGS: Rotator cuff: Mild tendinosis of the supraspinatus, infraspinatus, and subscapularis tendons without discrete tear.  Muscles: Preserved bulk and signal intensity of the rotator cuff musculature without edema, atrophy, or fatty infiltration.  Biceps long head:  Intact and appropriately  positioned.  Acromioclavicular Joint: Mild degenerative changes of the AC joint. No significant subacromial-subdeltoid bursal fluid.  Glenohumeral Joint: No significant joint effusion. Mild chondral thinning without focal defect.  Labrum: Grossly intact although evaluation is limited in the absence of intra-articular fluid. No paralabral cyst.  Bones: No acute fracture. No dislocation. No bone marrow edema. No marrow replacing bone lesion.  Other: None.  IMPRESSION: 1. Mild rotator cuff tendinosis without discrete tear. 2. Mild acromioclavicular and glenohumeral osteoarthritis.   Electronically Signed By: Mabel Converse D.O. On: 12/15/2021 10:48  Shoulder-R CT w contrast: No results found for this or any previous visit.  Shoulder-L CT w contrast: No results found for this or any previous visit.  Shoulder-R CT w/wo contrast: No results found for this or any previous visit.  Shoulder-L CT w/wo contrast: No results found for this or any previous visit.  Shoulder-R CT wo contrast: No results found for this or any previous visit.  Shoulder-L CT wo contrast: No results found for this or  any previous visit.  Shoulder-R DG Arthrogram: No results found for this or any previous visit.  Shoulder-L DG Arthrogram: No results found for this or any previous visit.  Shoulder-R DG 1 view: No results found for this or any previous visit.  Shoulder-L DG 1 view: No results found for this or any previous visit.  Shoulder-R DG: No results found for this or any previous visit.  Shoulder-L DG: No results found for this or any previous visit.   Thoracic Imaging: Thoracic MR wo contrast: No results found for this or any previous visit.  Thoracic MR wo contrast: No results found for this or any previous visit.  Thoracic MR w/wo contrast: No results found for this or any previous visit.  Thoracic MR w contrast: No results found for this or any previous visit.  Thoracic CT wo  contrast: No results found for this or any previous visit.  Thoracic CT w/wo contrast: No results found for this or any previous visit.  Thoracic CT w/wo contrast: No results found for this or any previous visit.  Thoracic CT w contrast: No results found for this or any previous visit.  Thoracic DG 2-3 views: No results found for this or any previous visit.  Thoracic DG 4 views: No results found for this or any previous visit.  Thoracic DG: No results found for this or any previous visit.  Thoracic DG w/swimmers view: No results found for this or any previous visit.  Thoracic DG Myelogram views: No results found for this or any previous visit.  Thoracic DG Myelogram views: No results found for this or any previous visit.   Lumbosacral Imaging: Lumbar MR wo contrast: No results found for this or any previous visit.  Lumbar MR wo contrast: No results found for this or any previous visit.  Lumbar MR w/wo contrast: No results found for this or any previous visit.  Lumbar MR w/wo contrast: No results found for this or any previous visit.  Lumbar MR w contrast: No results found for this or any previous visit.  Lumbar CT wo contrast: No results found for this or any previous visit.  Lumbar CT w/wo contrast: No results found for this or any previous visit.  Lumbar CT w/wo contrast: No results found for this or any previous visit.  Lumbar CT w contrast: No results found for this or any previous visit.  Lumbar DG 1V: No results found for this or any previous visit.  Lumbar DG 1V (Clearing): No results found for this or any previous visit.  Lumbar DG 2-3V (Clearing): No results found for this or any previous visit.  Lumbar DG 2-3 views: No results found for this or any previous visit.  Lumbar DG (Complete) 4+V: No results found for this or any previous visit.        Lumbar DG F/E views: No results found for this or any previous visit.        Lumbar DG Bending views: No  results found for this or any previous visit.        Lumbar DG Myelogram views: No results found for this or any previous visit.  Lumbar DG Myelogram: No results found for this or any previous visit.  Lumbar DG Myelogram: No results found for this or any previous visit.  Lumbar DG Myelogram: No results found for this or any previous visit.  Lumbar DG Myelogram Lumbosacral: No results found for this or any previous visit.  Lumbar DG Diskogram views: No results found for  this or any previous visit.  Lumbar DG Diskogram views: No results found for this or any previous visit.  Lumbar DG Epidurogram OP: No results found for this or any previous visit.  Lumbar DG Epidurogram IP: No results found for this or any previous visit.   Sacroiliac Joint Imaging: Sacroiliac Joint DG: No results found for this or any previous visit.  Sacroiliac Joint MR w/wo contrast: No results found for this or any previous visit.  Sacroiliac Joint MR wo contrast: No results found for this or any previous visit.   Spine Imaging: Whole Spine DG Myelogram views: No results found for this or any previous visit.  Whole Spine MR Mets screen: No results found for this or any previous visit.  Whole Spine MR Mets screen: No results found for this or any previous visit.  Whole Spine MR w/wo: No results found for this or any previous visit.  MRA Spinal Canal w/ cm: No results found for this or any previous visit.  MRA Spinal Canal wo/ cm: No results found for this or any previous visit.  MRA Spinal Canal w/wo cm: No results found for this or any previous visit.  Spine Outside MR Films: No results found for this or any previous visit.  Spine Outside CT Films: No results found for this or any previous visit.  CT-Guided Biopsy: No results found for this or any previous visit.  CT-Guided Needle Placement: No results found for this or any previous visit.  DG Spine outside: No results found for this or any  previous visit.  IR Spine outside: No results found for this or any previous visit.  NM Spine outside: No results found for this or any previous visit.   Hip Imaging: Hip-R MR w contrast: No results found for this or any previous visit.  Hip-L MR w contrast: No results found for this or any previous visit.  Hip-R MR w/wo contrast: No results found for this or any previous visit.  Hip-L MR w/wo contrast: No results found for this or any previous visit.  Hip-R MR wo contrast: No results found for this or any previous visit.  Hip-L MR wo contrast: No results found for this or any previous visit.  Hip-R CT w contrast: No results found for this or any previous visit.  Hip-L CT w contrast: No results found for this or any previous visit.  Hip-R CT w/wo contrast: No results found for this or any previous visit.  Hip-L CT w/wo contrast: No results found for this or any previous visit.  Hip-R CT wo contrast: No results found for this or any previous visit.  Hip-L CT wo contrast: No results found for this or any previous visit.  Hip-R DG 2-3 views: Results for orders placed during the hospital encounter of 12/04/22  DG Hip Unilat W or Wo Pelvis 2-3 Views Right  Narrative CLINICAL DATA:  Right hip pain.  No recent injury.  EXAM: DG HIP (WITH OR WITHOUT PELVIS) 2-3V RIGHT  COMPARISON:  CT abdomen and pelvis and reconstructed images of 11/05/2022  FINDINGS: There is mild joint space loss of both hips and trace femoral head spurring. No pelvic fracture or diastasis or hip fracture are seen. There is mild pelvic enthesopathy. Intestinal postsurgical changes are again noted in the pelvis.  IMPRESSION: Mild degenerative change without evidence of fractures. Mild pelvic enthesopathy.   Electronically Signed By: Francis Quam M.D. On: 12/04/2022 07:44  Hip-L DG 2-3 views: No results found for this or  any previous visit.  Hip-R DG Arthrogram: No results found for this or  any previous visit.  Hip-L DG Arthrogram: No results found for this or any previous visit.  Hip-B DG Bilateral: No results found for this or any previous visit.  Hip-B DG Bilateral (5V): No results found for this or any previous visit.   Knee Imaging: Knee-R MR w contrast: No results found for this or any previous visit.  Knee-L MR w/o contrast: No results found for this or any previous visit.  Knee-R MR w/wo contrast: No results found for this or any previous visit.  Knee-L MR w/wo contrast: No results found for this or any previous visit.  Knee-R MR wo contrast: No results found for this or any previous visit.  Knee-L MR wo contrast: No results found for this or any previous visit.  Knee-R CT w contrast: No results found for this or any previous visit.  Knee-L CT w contrast: No results found for this or any previous visit.  Knee-R CT w/wo contrast: No results found for this or any previous visit.  Knee-L CT w/wo contrast: No results found for this or any previous visit.  Knee-R CT wo contrast: No results found for this or any previous visit.  Knee-L CT wo contrast: No results found for this or any previous visit.  Knee-R DG 1-2 views: No results found for this or any previous visit.  Knee-L DG 1-2 views: No results found for this or any previous visit.  Knee-R DG 3 views: No results found for this or any previous visit.  Knee-L DG 3 views: No results found for this or any previous visit.  Knee-R DG 4 views: No results found for this or any previous visit.  Knee-L DG 4 views: No results found for this or any previous visit.  Knee-R DG Arthrogram: No results found for this or any previous visit.  Knee-L DG Arthrogram: No results found for this or any previous visit.   Ankle Imaging: Ankle-R DG Complete: No results found for this or any previous visit.  Ankle-L DG Complete: No results found for this or any previous visit.   Foot Imaging: Foot-R DG Complete: No  results found for this or any previous visit.  Foot-L DG Complete: No results found for this or any previous visit.   Elbow Imaging: Elbow-R DG Complete: No results found for this or any previous visit.  Elbow-L DG Complete: No results found for this or any previous visit.   Wrist Imaging: Wrist-R DG Complete: No results found for this or any previous visit.  Wrist-L DG Complete: No results found for this or any previous visit.   Hand Imaging: Hand-R DG Complete: No results found for this or any previous visit.  Hand-L DG Complete: No results found for this or any previous visit.   Complexity Note: Imaging results reviewed.                         ROS  Cardiovascular: {Hx; Cardiovascular History:210120525} Pulmonary or Respiratory: {Hx; Pumonary and/or Respiratory History:210120523} Neurological: {Hx; Neurological:210120504} Psychological-Psychiatric: {Hx; Psychological-Psychiatric History:210120512} Gastrointestinal: {Hx; Gastrointestinal:210120527} Genitourinary: {Hx; Genitourinary:210120506} Hematological: {Hx; Hematological:210120510} Endocrine: {Hx; Endocrine history:210120509} Rheumatologic: {Hx; Rheumatological:210120530} Musculoskeletal: {Hx; Musculoskeletal:210120528} Work History: {Hx; Work history:210120514}  Allergies  Elizabeth Martin is allergic to carboplatin, hydromorphone , latex, nitrofurantoin, silicone, codeine, other, wheat, and oxycodone .  Laboratory Chemistry Profile   Renal Lab Results  Component Value Date   BUN 13 01/09/2024   CREATININE 0.59  01/09/2024   GFRNONAA >60 01/09/2024   SPECGRAV 1.010 08/12/2023   PHUR 7.0 08/12/2023   PROTEINUR NEGATIVE 01/09/2024     Electrolytes Lab Results  Component Value Date   NA 137 01/09/2024   K 4.0 01/09/2024   CL 102 01/09/2024   CALCIUM 9.3 01/09/2024   MG 2.0 02/12/2023   PHOS 3.6 02/12/2023     Hepatic Lab Results  Component Value Date   AST 18 01/09/2024   ALT 11 01/09/2024   ALBUMIN  4.0 01/09/2024   ALKPHOS 66 01/09/2024   LIPASE 30 01/09/2024     ID Lab Results  Component Value Date   HIV Non Reactive 01/10/2024   SARSCOV2NAA NEGATIVE 02/11/2023   STAPHAUREUS NEGATIVE 11/01/2023   MRSAPCR NEGATIVE 11/01/2023     Bone No results found for: VD25OH, CI874NY7UNU, CI6874NY7, CI7874NY7, 25OHVITD1, 25OHVITD2, 25OHVITD3, TESTOFREE, TESTOSTERONE   Endocrine Lab Results  Component Value Date   GLUCOSE 108 (H) 01/09/2024   GLUCOSEU NEGATIVE 01/09/2024   TSH 2.066 12/21/2023     Neuropathy Lab Results  Component Value Date   HIV Non Reactive 01/10/2024     CNS No results found for: COLORCSF, APPEARCSF, RBCCOUNTCSF, WBCCSF, POLYSCSF, LYMPHSCSF, EOSCSF, PROTEINCSF, GLUCCSF, JCVIRUS, CSFOLI, IGGCSF, LABACHR, ACETBL   Inflammation (CRP: Acute  ESR: Chronic) Lab Results  Component Value Date   LATICACIDVEN 1.5 05/18/2022     Rheumatology No results found for: RF, ANA, LABURIC, URICUR, LYMEIGGIGMAB, LYMEABIGMQN, HLAB27   Coagulation Lab Results  Component Value Date   INR 1.0 05/23/2022   LABPROT 13.2 05/23/2022   PLT 421 (H) 01/09/2024     Cardiovascular Lab Results  Component Value Date   HGB 13.2 01/09/2024   HCT 41.2 01/09/2024     Screening Lab Results  Component Value Date   SARSCOV2NAA NEGATIVE 02/11/2023   STAPHAUREUS NEGATIVE 11/01/2023   MRSAPCR NEGATIVE 11/01/2023   HIV Non Reactive 01/10/2024     Cancer No results found for: CEA, CA125, LABCA2   Allergens No results found for: ALMOND, APPLE, ASPARAGUS, AVOCADO, BANANA, BARLEY, BASIL, BAYLEAF, GREENBEAN, LIMABEAN, WHITEBEAN, BEEFIGE, REDBEET, BLUEBERRY, BROCCOLI, CABBAGE, MELON, CARROT, CASEIN, CASHEWNUT, CAULIFLOWER, CELERY     Note: Lab results reviewed.  PFSH  Drug: Elizabeth Martin  reports no history of drug use. Alcohol:  reports current alcohol use. Tobacco:  reports  that she has never smoked. She has never used smokeless tobacco. Medical:  has a past medical history of Anxiety, Asthma, Autoimmune disease, Cancer (HCC), Corneal abrasion, right (05/24/2022), Depression, GERD (gastroesophageal reflux disease), H/O blood clots, History of hiatal hernia, Hypertension, Lymphedema, Morphea scleroderma, and PONV (postoperative nausea and vomiting). Family: family history is not on file.  Past Surgical History:  Procedure Laterality Date   BASAL CELL CARCINOMA EXCISION Left    CHOLECYSTECTOMY  2008   FLEXIBLE SIGMOIDOSCOPY N/A 05/19/2022   Procedure: FLEXIBLE SIGMOIDOSCOPY;  Surgeon: Toledo, Ladell POUR, MD;  Location: [facility];  Service: Gastroenterology;  Laterality: N/A;   HERNIA REPAIR  2004   ILEOSTOMY CLOSURE N/A 08/24/2022   Procedure: ILEOSTOMY TAKEDOWN, open loop with Arthea Platt, PA-C to assist;  Surgeon: Desiderio Schanz, MD;  Location: [facility] ORS;  Service: General;  Laterality: N/A;   IVC FILTER INSERTION N/A 11/09/2023   Procedure: IVC FILTER INSERTION;  Surgeon: Marea Selinda RAMAN, MD;  Location: [facility];  Service: Cardiovascular;  Laterality: N/A;   IVC FILTER REMOVAL N/A 01/02/2024   Procedure: IVC FILTER REMOVAL;  Surgeon: Marea Selinda RAMAN, MD;  Location: [facility]  [facility];  Service: Cardiovascular;  Laterality: N/A;   PLANTAR FASCIECTOMY     ROBOTIC ASSISTED TOTAL HYSTERECTOMY WITH BILATERAL SALPINGO OOPHERECTOMY  02/14/2012   ROTATOR CUFF REPAIR Left 03/24/2022   TONSILLECTOMY     TOTAL HIP ARTHROPLASTY Right 11/14/2023   Procedure: TOTAL HIP ARTHROPLASTY - posterior;  Surgeon: Lorelle Hussar, MD;  Location: [facility] ORS;  Service: Orthopedics;  Laterality: Right;   XI ROBOTIC ASSISTED LOWER ANTERIOR RESECTION N/A 05/24/2022   Procedure: XI ROBOTIC ASSISTED LOWER ANTERIOR RESECTION;  Surgeon: Desiderio Schanz, MD;  Location: [facility] ORS;  Service: General;  Laterality: N/A;   Active Ambulatory Problems    Diagnosis Date Noted   Hypokalemia  05/18/2022   Abdominal pain 05/18/2022   Asthma 06[birth year]/1969   Gastroesophageal reflux disease 02/15/2020   Essential hypertension 08/09/2017   Colonic stricture (HCC) 05/19/2022   Large bowel obstruction (HCC) 05/19/2022   S/P closure of ileostomy 08/24/2022   Ileostomy in place [facility])    Small bowel obstruction (HCC) 02/11/2023   Raynaud disease 02/11/2023   Polyarthralgia 02/11/2023   Exposure to severe acute respiratory syndrome coronavirus 2 (SARS-CoV-2) 12/19/2020   Diarrhea 02/15/2020   Diverticular disease 12/10/2022   Dyspnea 03/07/2023   Edema 01/03/2023   Edema of lower extremity 07/11/2020   Elevated testosterone level in female 11/04/2021   Endometrial cancer (HCC) 04/05/2018   Fatigue 11/16/2017   Gastroenteritis 12/07/2017   Adhesive capsulitis of left shoulder 01/27/2022   Glenohumeral arthritis, left 01/27/2022   Hair loss 07/14/2020   Hashimoto thyroiditis, 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   Hashimoto's disease 03/23/2021   History of endometrial cancer 08/09/2017   Hot flashes 12/10/2022   Hyperlipidemia 07/11/2019   Lymphedema 08/09/2017   Malignant neoplastic disease (HCC) 04/07/2017   Menopause 08/09/2017   Microscopic hematuria 01/12/2022   Migraine without aura and without status migrainosus, not intractable 08/19/2020   Mixed connective tissue disease 02/17/2022   Morbid obesity (HCC) 08/11/2017   Morphea 07/13/2022   Myofascial pain 07/30/2020   Neck pain 07/30/2020   Osteoarthritis of left knee 02/24/2022   Otorrhagia of right ear 12/10/2021   Pain of right lower extremity 12/10/2022   Paresthesia of lower extremity 12/10/2022   Patellofemoral pain syndrome of right knee 03/20/2018   Pre-operative clearance 12/13/2018   Primary insomnia 12/10/2022   Rash 10/13/2021   Right shoulder pain 04/04/2019   [facility] spotted fever 03/07/2023   Sleep disorder 01/16/2020   Strain of gastrocnemius tendon 03/07/2023   Stucco keratoses 08/11/2017    Thyroid  nodule 12/10/2022   Lower abdominal pain 03/07/2023   Pain in pelvis 03/07/2023   Upper back pain 07/30/2020   Benign hypertension 06/02/2016   Multiple joint pain 12/10/2022   Colon cancer screening 03/15/2023   Chronic idiopathic constipation 03/15/2023   Cervical radiculopathy 03/16/2023   Cervical facet joint syndrome 07/30/2020   Cellulitis 09/18/2016   Blood coagulation disorder 11/10/2023   Basal cell carcinoma of skin 12/10/2022   Anxiety and depression 03/22/2023   Anti-TPO antibodies present 10/20/2021   Adenomatous colon polyp 02/26/2020   Chronic lower back pain 09/05/2022   Acute bilateral low back pain without sciatica 11/10/2022   PTSD (post-traumatic stress disorder) 11/10/2023   Severe episode of recurrent major depressive disorder, without psychotic features (HCC) 11/10/2023   GAD (generalized anxiety disorder) 11/10/2023   High risk medication use 11/10/2023   S/P total right hip arthroplasty 11/14/2023   Constipation 01/10/2024   Nausea & vomiting 01/10/2024   Recurrent major depressive disorder, in full remission  07/27/2024   Resolved Ambulatory Problems    Diagnosis Date Noted   No Resolved Ambulatory Problems   Past Medical History:  Diagnosis Date   Anxiety    Autoimmune disease    Cancer (HCC)    Corneal abrasion, right 05/24/2022   Depression    GERD (gastroesophageal reflux disease)    H/O blood clots    History of hiatal hernia    Hypertension    Morphea scleroderma    PONV (postoperative nausea and vomiting)    Constitutional Exam  General appearance: Well nourished, well developed, and well hydrated. In no apparent acute distress There were no vitals filed for this visit. BMI Assessment: Estimated body mass index is 28.15 kg/m as calculated from the following:   Height as of 04/04/24: 5' 6 (1.676 m).   Weight as of 04/04/24: 174 lb 6.4 oz (79.1 kg).  BMI interpretation table: BMI level Category Range association with higher  incidence of chronic pain  <18 kg/m2 Underweight   18.5-24.9 kg/m2 Ideal body weight   25-29.9 kg/m2 Overweight Increased incidence by 20%  30-34.9 kg/m2 Obese (Class I) Increased incidence by 68%  35-39.9 kg/m2 Severe obesity (Class II) Increased incidence by 136%  >40 kg/m2 Extreme obesity (Class III) Increased incidence by 254%   Patient's current BMI Ideal Body weight  There is no height or weight on file to calculate BMI. Patient weight not recorded   BMI Readings from Last 4 Encounters:  01/09/24 23.48 kg/m  12/21/23 27.08 kg/m  11/14/23 28.89 kg/m  11/09/23 28.30 kg/m   Wt Readings from Last 4 Encounters:  01/09/24 149 lb 14.6 oz (68 kg)  12/21/23 167 lb 12.8 oz (76.1 kg)  11/14/23 179 lb (81.2 kg)  11/09/23 180 lb 11.2 oz (82 kg)    Psych/Mental status: Alert, oriented x 3 (person, place, & time)       Eyes: PERLA Respiratory: No evidence of acute respiratory distress  Assessment  Primary Diagnosis & Pertinent Problem List: There were no encounter diagnoses.  Visit Diagnosis (New problems to examiner): No diagnosis found. Plan of Care (Initial workup plan)  Note: Elizabeth Martin was reminded that as per protocol, today's visit has been an evaluation only. We have not taken over the patient's controlled substance management.  Problem-specific plan: Assessment and Plan            Lab Orders  No laboratory test(s) ordered today   Imaging Orders  No imaging studies ordered today   Referral Orders  No referral(s) requested today   Procedure Orders    No procedure(s) ordered today   Pharmacotherapy (current): Medications ordered:  No orders of the defined types were placed in this encounter.  Medications administered during this visit: Elizabeth Martin had no medications administered during this visit.   Analgesic Pharmacotherapy:  Opioid Analgesics: For patients currently taking or requesting to take opioid analgesics, in accordance with [facility] Guidelines, we will assess their risks and indications for the use of these substances. After completing our evaluation, we may offer recommendations, but we no longer take patients for medication management. The prescribing physician will ultimately decide, based on his/her training and level of comfort whether to adopt any of the recommendations, including whether or not to prescribe such medicines.  Membrane stabilizer: To be determined at a later time  Muscle relaxant: To be determined at a later time  NSAID: To be determined at a later time  Other analgesic(s): To be determined at a  later time   Interventional management options: Elizabeth Martin was informed that there is no guarantee that she would be a candidate for interventional therapies. The decision will be based on the results of diagnostic studies, as well as Elizabeth Martin's risk profile.  Procedure(s) under consideration:  Pending results of ordered studies     Interventional Therapies  Risk Factors  Considerations  Medical Comorbidities:     Planned  Pending:      Under consideration:   Pending   Completed: (Analgesic benefit)1  None at this time   Therapeutic  Palliative (PRN) options:   None established   Completed by other providers:   None reported  1(Analgesic benefit): Expressed in percentage (%). (Local anesthetic[LA] +/- sedation  L.A.Local Anesthetic  Steroid benefit  Ongoing benefit)   Provider-requested follow-up: No follow-ups on file.  Future Appointments  Date Time Provider Department Center  09/05/2024  8:00 AM Tanya Glisson, MD [facility] None  09/19/2024  9:00 AM Lanell Small B, LCSW [facility] None  11/08/2024  9:00 AM Eappen, Saramma, MD [facility] None   I discussed the assessment and treatment plan with the patient. The patient was provided an opportunity to ask questions and all were answered. The patient agreed with the plan and demonstrated an understanding  of the instructions.  Patient advised to call back or seek an in-person evaluation if the symptoms or condition worsens.  Duration of encounter: *** minutes.  Total time on encounter, as per AMA guidelines included both the face-to-face and non-face-to-face time personally spent by the physician and/or other qualified health care professional(s) on the day of the encounter (includes time in activities that require the physician or other qualified health care professional and does not include time in activities normally performed by clinical staff). Physician's time may include the following activities when performed: Preparing to see the patient (e.g., pre-charting review of records, searching for previously ordered imaging, lab work, and nerve conduction tests) Review of prior analgesic pharmacotherapies. Reviewing PMP Interpreting ordered tests (e.g., lab work, imaging, nerve conduction tests) Performing post-procedure evaluations, including interpretation of diagnostic procedures Obtaining and/or reviewing separately obtained history Performing a medically appropriate examination and/or evaluation Counseling and educating the patient/family/caregiver Ordering medications, tests, or procedures Referring and communicating with other health care professionals (when not separately reported) Documenting clinical information in the electronic or other health record Independently interpreting results (not separately reported) and communicating results to the patient/ family/caregiver Care coordination (not separately reported)  Note by: Glisson DELENA Tanya, MD (TTS and AI technology used. I apologize for any typographical errors that were not detected and corrected.) Date: 09/05/2024; Time: 3:25 PM

## 2024-09-05 ENCOUNTER — Ambulatory Visit

## 2024-09-05 ENCOUNTER — Ambulatory Visit: Admitting: Occupational Therapy

## 2024-09-05 ENCOUNTER — Ambulatory Visit: Attending: Pain Medicine | Admitting: Pain Medicine

## 2024-09-05 ENCOUNTER — Ambulatory Visit
Admission: RE | Admit: 2024-09-05 | Discharge: 2024-09-05 | Disposition: A | Discharge status: Home / Self Care | Attending: Pain Medicine | Admitting: Pain Medicine

## 2024-09-05 ENCOUNTER — Encounter: Payer: Self-pay | Admitting: Pain Medicine

## 2024-09-05 ENCOUNTER — Ambulatory Visit
Admission: RE | Admit: 2024-09-05 | Discharge: 2024-09-05 | Disposition: A | Discharge status: Home / Self Care | Source: Ambulatory Visit | Attending: Pain Medicine | Admitting: Pain Medicine

## 2024-09-05 VITALS — BP 132/72 | HR 74 | Temp 96.4°F | Ht 67.0 in | Wt 180.0 lb

## 2024-09-05 DIAGNOSIS — M25552 Pain in left hip: Secondary | ICD-10-CM | POA: Insufficient documentation

## 2024-09-05 DIAGNOSIS — M542 Cervicalgia: Secondary | ICD-10-CM | POA: Insufficient documentation

## 2024-09-05 DIAGNOSIS — M899 Disorder of bone, unspecified: Secondary | ICD-10-CM | POA: Insufficient documentation

## 2024-09-05 DIAGNOSIS — M25551 Pain in right hip: Secondary | ICD-10-CM | POA: Insufficient documentation

## 2024-09-05 DIAGNOSIS — Z96641 Presence of right artificial hip joint: Secondary | ICD-10-CM | POA: Insufficient documentation

## 2024-09-05 DIAGNOSIS — R937 Abnormal findings on diagnostic imaging of other parts of musculoskeletal system: Secondary | ICD-10-CM

## 2024-09-05 DIAGNOSIS — M545 Low back pain, unspecified: Secondary | ICD-10-CM

## 2024-09-05 DIAGNOSIS — Z789 Other specified health status: Secondary | ICD-10-CM | POA: Insufficient documentation

## 2024-09-05 DIAGNOSIS — Z79899 Other long term (current) drug therapy: Secondary | ICD-10-CM | POA: Insufficient documentation

## 2024-09-05 DIAGNOSIS — R94130 Abnormal response to nerve stimulation, unspecified: Secondary | ICD-10-CM

## 2024-09-05 DIAGNOSIS — G8929 Other chronic pain: Secondary | ICD-10-CM | POA: Diagnosis present

## 2024-09-05 DIAGNOSIS — M351 Other overlap syndromes: Secondary | ICD-10-CM | POA: Insufficient documentation

## 2024-09-05 DIAGNOSIS — G894 Chronic pain syndrome: Secondary | ICD-10-CM | POA: Insufficient documentation

## 2024-09-05 NOTE — Progress Notes (Signed)
 Safety precautions to be maintained throughout the outpatient stay will include: orient to surroundings, keep bed in low position, maintain call bell within reach at all times, provide assistance with transfer out of bed and ambulation.

## 2024-09-05 NOTE — Patient Instructions (Signed)

## 2024-09-09 LAB — COMPLIANCE DRUG ANALYSIS, UR

## 2024-09-10 ENCOUNTER — Ambulatory Visit

## 2024-09-10 ENCOUNTER — Ambulatory Visit: Admitting: Occupational Therapy

## 2024-09-12 ENCOUNTER — Ambulatory Visit: Admitting: Occupational Therapy

## 2024-09-12 ENCOUNTER — Ambulatory Visit

## 2024-09-13 LAB — SEDIMENTATION RATE: Sed Rate: 6 mm/h (ref 0–40)

## 2024-09-13 LAB — COMP. METABOLIC PANEL (12)
AST: 16 IU/L (ref 0–40)
Albumin: 4.1 g/dL (ref 3.9–4.9)
Alkaline Phosphatase: 62 IU/L (ref 49–135)
BUN/Creatinine Ratio: 19 (ref 12–28)
BUN: 14 mg/dL (ref 8–27)
Bilirubin Total: 0.2 mg/dL (ref 0.0–1.2)
Calcium: 9.3 mg/dL (ref 8.7–10.3)
Chloride: 99 mmol/L (ref 96–106)
Creatinine, Ser: 0.72 mg/dL (ref 0.57–1.00)
Globulin, Total: 1.9 g/dL (ref 1.5–4.5)
Glucose: 85 mg/dL (ref 70–99)
Potassium: 4.8 mmol/L (ref 3.5–5.2)
Sodium: 139 mmol/L (ref 134–144)
Total Protein: 6 g/dL (ref 6.0–8.5)
eGFR: 94 mL/min/1.73

## 2024-09-13 LAB — 25-HYDROXY VITAMIN D LCMS D2+D3
25-Hydroxy, Vitamin D-2: 1.4 ng/mL
25-Hydroxy, Vitamin D-3: 37 ng/mL
25-Hydroxy, Vitamin D: 38 ng/mL

## 2024-09-13 LAB — MAGNESIUM: Magnesium: 2.4 mg/dL — ABNORMAL HIGH (ref 1.6–2.3)

## 2024-09-13 LAB — C-REACTIVE PROTEIN: CRP: 2 mg/L (ref 0–10)

## 2024-09-13 LAB — VITAMIN B12: Vitamin B-12: 246 pg/mL (ref 232–1245)

## 2024-09-17 ENCOUNTER — Ambulatory Visit: Admitting: Occupational Therapy

## 2024-09-17 ENCOUNTER — Ambulatory Visit

## 2024-09-19 ENCOUNTER — Ambulatory Visit: Admitting: Occupational Therapy

## 2024-09-19 ENCOUNTER — Ambulatory Visit: Admitting: Licensed Clinical Social Worker

## 2024-09-19 ENCOUNTER — Encounter: Payer: Self-pay | Admitting: Licensed Clinical Social Worker

## 2024-09-19 ENCOUNTER — Ambulatory Visit

## 2024-09-19 DIAGNOSIS — F411 Generalized anxiety disorder: Secondary | ICD-10-CM

## 2024-09-19 DIAGNOSIS — F4310 Post-traumatic stress disorder, unspecified: Secondary | ICD-10-CM

## 2024-09-19 DIAGNOSIS — F3342 Major depressive disorder, recurrent, in full remission: Secondary | ICD-10-CM | POA: Diagnosis not present

## 2024-09-19 NOTE — Progress Notes (Signed)
 THERAPIST PROGRESS NOTE  Virtual Visit via Video Note  I connected with Elizabeth Martin on 09/19/24 at  9:00 AM EDT by a video enabled telemedicine application and verified that I am speaking with the correct person using two identifiers.  Location: Patient: Address on file  Provider: [facility]   I discussed the limitations of evaluation and management by telemedicine and the availability of in person appointments. The patient expressed understanding and agreed to proceed.   I discussed the assessment and treatment plan with the patient. The patient was provided an opportunity to ask questions and all were answered. The patient agreed with the plan and demonstrated an understanding of the instructions.   The patient was advised to call back or seek an in-person evaluation if the symptoms worsen or if the condition fails to improve as anticipated.  I provided 46 minutes of non-face-to-face time during this encounter.   [name], LCSW   Session Time: 9-9:46am  Participation Level: Active  Behavioral Response: CasualAlertEuthymic  Type of Therapy: Individual Therapy  Treatment Goals addressed:  Active     BH CCP Acute or Chronic Trauma Reaction     LTG: Elimination of maladaptive behaviors and thinking patterns which interfere with resolution of trauma as evidenced by self report (Completed/Met)     Start:  11/25/23    Expected End:  04/24/24    Resolved:  07/23/24    Goal Note     07/23/24: Pt reports marked improvement.         LTG: Develop and implement effective coping skills to carry out normal responsibilities and participate constructively in relationships as evidenced by self report (Completed/Met)     Start:  11/25/23    Expected End:  04/24/24    Resolved:  07/23/24    Goal Note     07/23/24: Pt reports marked improvement.         LTG: Recall traumatic events without becoming overwhelmed with negative emotions (Completed/Met)     Start:   11/25/23    Expected End:  04/24/24    Resolved:  07/23/24      LTG: Pt reports think about these things in the past without having such an impact on my life.  (Completed/Met)     Start:  11/25/23    Expected End:  10/23/24    Resolved:  09/19/24    Goal Note     09/19/24: I think that I've met that.          Cooperate with trauma-focused psychotherapy techniques to reduce emotional reaction to the traumatic event      Start:  11/25/23         Educate Elizabeth on common reactions to a traumatic experience     Start:  11/25/23         Assess whether Elizabeth experiences dissociative symptoms (e.g., flashbacks, memory loss, identity disorder), and treat or refer for treatment     Start:  11/25/23         Work with Elizabeth to construct a list of the situations, people, & places that [place] evoke the most distressing symptoms; suggest that they keep a journal of instances of stress being triggered     Start:  11/25/23         Teach Elizabeth coping strategies (e.g., writing down thoughts and feelings in a journal; taking deep, slow breaths; calling a support person to talk about memories) to deal with trauma memories and sudden emotional reactions without becoming emotionally  nu     Start:  11/25/23         Encourage [name] to identify 2 trauma related cognitive distortions     Start:  11/25/23           OP Depression     LTG: Reduce frequency, intensity, and duration of depression symptoms so that daily functioning is improved (Completed/Met)     Start:  11/25/23    Expected End:  04/24/24    Resolved:  07/23/24      STG: Janaria will identify cognitive patterns and beliefs that support depression (Completed/Met)     Start:  11/25/23    Expected End:  04/24/24    Resolved:  07/23/24      LTG: Pt identifies the goal to Accept chronic pain and learning coping skills  (Progressing)     Start:  11/25/23    Expected End:  10/23/24       Goal Note     Cln will send  patient resources on chronic pain and mindfulness.          Work with Elizabeth to identify the major components of a recent episode of depression: physical symptoms, major thoughts and images, and major behaviors they experienced     Start:  11/25/23         Therapist will educate patient on cognitive distortions and the rationale for treatment of depression     Start:  11/25/23         [name] will identify 2 cognitive distortions they are currently using and write reframing statements to replace them     Start:  11/25/23         Coping Skills      Start:  11/25/23       Will work with the pt using CBT/DBT techniques to help the pt verbalize an understanding of the cognitive, physiological, and behavioral components of depression and its treatment. This will be done by using worksheets, interactive activities, CBT/ABC thought logs, modeling, homework, role playing and journaling. Will work with pt to learn and implement coping skills that result in a reduction of depression and improve daily functioning per pt self report 3 out of 5 documented sessions.         Self Esteem:     Pt states a goal of:    07/23/24: I think I have a little bit of social anxiety but I hide it really well. She believes this involves concerns how other's think about her related to her intelligence.     LTG: Pt reports she wants to feel more confident when she speaks.  (Progressing)     Start:  07/23/24    Expected End:  10/23/24      07/23/24: Pt reports she feels she is still concerned how other people judge her intellegence. Sites limitations due to age and conditions which impact her ability to find her words.     Goal Note     09/19/24: What is happening right now is medications make me have to pause and really think about what I have to say. Reports she often deals with irritability and fatigue which makes her self conscious.          CBT     Start:  07/23/24      Will Work with  patient to decrease the frequency of negative self-descriptive statements and increase the frequency of positive self- descriptive statements using CBT/DBT/REBT techniques per patient self-report 3 out of 5 documented  sessions. Some of the techniques that will be used will be CBT, positive affirmations, role playing, modeling, homework and journaling.           ProgressTowards Goals: Progressing  Interventions: Solution Focused, Strength-based, and Supportive  Summary: Jaelle Campanile is a 63 y.o. female who presents with symptoms of depression and trauma. Patient identifies symptoms to include reexperiencing, uncontrollable worry, and hypervigilance. Pt was oriented times 5. Pt was cooperative and engaged. Pt denies SI/HI/AVH.    The patient reports that she is able to establish boundaries and has noticed improvements in her ability to reframe her thoughts; she is not experiencing feelings of hopelessness as frequently.   She is actively problem-solving to maintain a healthier perspective.   The patient states that she is future and goal-oriented, mentioning, I have too much to do to be weighed down by all that stuff [trauma symptoms].   Regarding her goal to feel more confident when she speaks, she notes, What is happening right now is that the medications make me have to pause and really think about what I want to say. She also reports dealing with irritability and fatigue, which contribute to her feelings of self-consciousness.Reports she is able to estabvlish boudnaries and has noticed improvements with ehr abioltyt o reframeing and not noticing momeents of hopelessnes as frequently.   Patient identified efforts she is taking to problem solve in an effort to keep a healthier perpective.   Suicidal/Homicidal: Nowithout intent/plan  Therapist Response: Clinician used active and supportive reflection to create a safe environment for patient to process recent life stressors. Clinician  assessed for current symptoms, stressors, safety since last session.  Patient reflected on progress she has made individually since beginning therapy.  Reflected on ways in which patient is successfully using problem-solving techniques to manage her symptoms as well as uncontrollable factors.  Identified the impact of patient's remaining hope and ensuring she accomplishes personal goals despite physical barriers.  Plan: Return again in 4 weeks.  Diagnosis:Recurrent major depressive disorder, in full remission  PTSD (post-traumatic stress disorder)  GAD (generalized anxiety disorder)   Collaboration of Care: AEB psychiatrist can access notes and cln. Will review psychiatrists' notes. Check in with the patient and will see LCSW per availability. Patient agreed with treatment recommendations.   Patient/Guardian was advised Release of Information must be obtained prior to any record release in order to collaborate their care with an outside provider. Patient/Guardian was advised if they have not already done so to contact the registration department to sign all necessary forms in order for us  to release information regarding their care.   Consent: Patient/Guardian gives verbal consent for treatment and assignment of benefits for services provided during this visit. Patient/Guardian expressed understanding and agreed to proceed.   [name], LCSW 09/19/2024

## 2024-09-21 ENCOUNTER — Other Ambulatory Visit: Payer: Self-pay | Admitting: Psychiatry

## 2024-09-21 DIAGNOSIS — F4310 Post-traumatic stress disorder, unspecified: Secondary | ICD-10-CM

## 2024-09-21 DIAGNOSIS — F411 Generalized anxiety disorder: Secondary | ICD-10-CM

## 2024-09-21 DIAGNOSIS — F3341 Major depressive disorder, recurrent, in partial remission: Secondary | ICD-10-CM

## 2024-09-24 ENCOUNTER — Ambulatory Visit

## 2024-09-24 ENCOUNTER — Ambulatory Visit: Admitting: Occupational Therapy

## 2024-09-26 ENCOUNTER — Ambulatory Visit

## 2024-09-26 ENCOUNTER — Ambulatory Visit: Admitting: Occupational Therapy

## 2024-10-01 ENCOUNTER — Ambulatory Visit

## 2024-10-01 ENCOUNTER — Ambulatory Visit: Admitting: Occupational Therapy

## 2024-10-03 ENCOUNTER — Ambulatory Visit

## 2024-10-03 ENCOUNTER — Ambulatory Visit: Admitting: Occupational Therapy

## 2024-10-04 NOTE — Telephone Encounter (Signed)
 fyi

## 2024-10-08 ENCOUNTER — Ambulatory Visit

## 2024-10-08 ENCOUNTER — Ambulatory Visit: Admitting: Occupational Therapy

## 2024-10-08 NOTE — Telephone Encounter (Signed)
 Placed paperwork up front to fax

## 2024-10-10 ENCOUNTER — Ambulatory Visit: Admitting: Occupational Therapy

## 2024-10-10 ENCOUNTER — Ambulatory Visit

## 2024-10-15 ENCOUNTER — Ambulatory Visit

## 2024-10-15 ENCOUNTER — Ambulatory Visit: Admitting: Occupational Therapy

## 2024-10-17 ENCOUNTER — Ambulatory Visit: Admitting: Occupational Therapy

## 2024-10-17 ENCOUNTER — Ambulatory Visit
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
## 2024-10-19 ENCOUNTER — Ambulatory Visit: Admitting: Physician Assistant

## 2024-10-19 DIAGNOSIS — S42291A Other displaced fracture of upper end of right humerus, initial encounter for closed fracture: Secondary | ICD-10-CM | POA: Diagnosis not present

## 2024-10-19 DIAGNOSIS — W19XXXA Unspecified fall, initial encounter: Secondary | ICD-10-CM

## 2024-10-19 NOTE — Patient Instructions (Signed)

## 2024-10-22 ENCOUNTER — Ambulatory Visit

## 2024-10-22 ENCOUNTER — Telehealth: Payer: Self-pay

## 2024-10-22 ENCOUNTER — Ambulatory Visit: Admitting: Occupational Therapy

## 2024-10-22 ENCOUNTER — Ambulatory Visit: Attending: Pain Medicine | Admitting: Pain Medicine

## 2024-10-22 ENCOUNTER — Encounter: Payer: Self-pay | Admitting: Pain Medicine

## 2024-10-22 VITALS — BP 127/74 | HR 74 | Temp 97.4°F | Resp 16 | Ht 67.0 in | Wt 175.0 lb

## 2024-10-22 DIAGNOSIS — Z96641 Presence of right artificial hip joint: Secondary | ICD-10-CM | POA: Diagnosis present

## 2024-10-22 DIAGNOSIS — Z7901 Long term (current) use of anticoagulants: Secondary | ICD-10-CM | POA: Diagnosis present

## 2024-10-22 DIAGNOSIS — M25552 Pain in left hip: Secondary | ICD-10-CM | POA: Insufficient documentation

## 2024-10-22 DIAGNOSIS — M4306 Spondylolysis, lumbar region: Secondary | ICD-10-CM | POA: Diagnosis present

## 2024-10-22 DIAGNOSIS — M25551 Pain in right hip: Secondary | ICD-10-CM | POA: Diagnosis not present

## 2024-10-22 DIAGNOSIS — R94130 Abnormal response to nerve stimulation, unspecified: Secondary | ICD-10-CM | POA: Insufficient documentation

## 2024-10-22 DIAGNOSIS — M542 Cervicalgia: Secondary | ICD-10-CM | POA: Insufficient documentation

## 2024-10-22 DIAGNOSIS — M545 Low back pain, unspecified: Secondary | ICD-10-CM | POA: Insufficient documentation

## 2024-10-22 DIAGNOSIS — M4312 Spondylolisthesis, cervical region: Secondary | ICD-10-CM | POA: Diagnosis present

## 2024-10-22 DIAGNOSIS — M961 Postlaminectomy syndrome, not elsewhere classified: Secondary | ICD-10-CM | POA: Insufficient documentation

## 2024-10-22 DIAGNOSIS — M4310 Spondylolisthesis, site unspecified: Secondary | ICD-10-CM | POA: Diagnosis present

## 2024-10-22 DIAGNOSIS — G8929 Other chronic pain: Secondary | ICD-10-CM | POA: Diagnosis present

## 2024-10-22 DIAGNOSIS — M4316 Spondylolisthesis, lumbar region: Secondary | ICD-10-CM | POA: Insufficient documentation

## 2024-10-22 DIAGNOSIS — R937 Abnormal findings on diagnostic imaging of other parts of musculoskeletal system: Secondary | ICD-10-CM | POA: Insufficient documentation

## 2024-10-22 DIAGNOSIS — S42201D Unspecified fracture of upper end of right humerus, subsequent encounter for fracture with routine healing: Secondary | ICD-10-CM | POA: Insufficient documentation

## 2024-10-22 NOTE — Progress Notes (Signed)
 PROVIDER NOTE: Interpretation of information contained herein should be left to medically-trained personnel. Specific patient instructions are provided elsewhere under Patient Instructions section of medical record. This document was created in part using AI and STT-dictation technology, any transcriptional errors that may result from this process are unintentional.  Patient: Elizabeth Martin  Service: E/M   PCP: Tobie Border, MD  DOB: 18-Apr-1961  DOS: 10/22/2024  Provider: Eric DELENA Como, MD  MRN: [id number]  Delivery: Face-to-face  Specialty: Interventional Pain Management  Type: Established Patient  Setting: Ambulatory outpatient facility  Specialty designation: 09  Referring Prov.: Tobie Border, MD  Location: Outpatient office facility       Primary Reason(s) for Visit: Encounter for evaluation before starting new chronic pain management plan of care (Level of risk: moderate) CC: Back Pain, Neck Pain, and Hip Pain (R>L)  HPI  Elizabeth Martin is a 63 y.o. year old, female patient, who comes today for a follow-up evaluation to review the test results and decide on a treatment plan. She has Hypokalemia; Abdominal pain; Asthma; Gastroesophageal reflux disease; Essential hypertension; Colonic stricture (HCC); Large bowel obstruction (HCC); S/P closure of ileostomy; Ileostomy in place [facility]); Small bowel obstruction (HCC); Raynaud disease; Polyarthralgia; Exposure to severe acute respiratory syndrome coronavirus 2 (SARS-CoV-2); Diarrhea; Diverticular disease; Dyspnea; Edema; Edema of right lower extremity; Primary malignant neoplasm of endometrium (HCC); Fatigue; Gastroenteritis; Adhesive capsulitis of left shoulder; Glenohumeral arthritis, left; Hair loss; Hashimoto's thyroiditis; Hashimoto's disease; History of endometrial cancer; Hot flashes; Hyperlipidemia; Lymphedema; Malignant neoplastic disease (HCC); Menopause; Microscopic hematuria; Migraine without aura and without status migrainosus, not  intractable; MCTD (mixed connective tissue disease); Morbid obesity (HCC); Morphea; Myofascial pain; Chronic neck pain (3ry area of Pain) (Bilateral); Osteoarthritis of left knee; Otorrhagia of right ear; Pain of right lower extremity; Paresthesia of lower extremity; Patellofemoral pain syndrome of right knee; Pre-operative clearance; Primary insomnia; Rash; Right shoulder pain; History of [place] spotted fever; Sleep disorder; Strain of gastrocnemius tendon; Stucco keratoses; Thyroid  nodule; Lower abdominal pain; Pain in pelvis; Upper back pain; Benign hypertension; Multiple joint pain; Colon cancer screening; Chronic idiopathic constipation; Cervical radiculopathy; Cervical facet joint syndrome; Cellulitis; Blood coagulation disorder; Basal cell carcinoma of skin; Anxiety and depression; Anti-TPO antibodies present; Adenomatous colon polyp; Chronic low back pain (1ry area of Pain) (Bilateral) (R>L) w/o sciatica; Acute bilateral low back pain without sciatica; PTSD (post-traumatic stress disorder); Severe episode of recurrent major depressive disorder, without psychotic features (HCC); GAD (generalized anxiety disorder); High risk medication use; History of total hip arthroplasty (THR) (Right); Constipation; Nausea & vomiting; Recurrent major depressive disorder, in full remission; Pain in right knee; Arthralgia of multiple joints; Unilateral primary osteoarthritis, left knee; Cough; Decreased range of motion of right knee; Deep venous thrombosis of lower extremity (HCC); Diverticulosis of colon; Malignant neoplasm of uterus (HCC); Mild intermittent asthma with acute exacerbation; Oral lesion; Pink eye disease of left eye; S/P partial colectomy; Acute midline low back pain without sciatica; Unspecified intestinal obstruction, unspecified as to partial versus complete obstruction (HCC); Chronic pain syndrome; Pharmacologic therapy; Disorder of skeletal system; Problems influencing health status; Abnormal MRI,  lumbar spine (01/10/2023) [facility]); Abnormal NCS (nerve conduction studies) (01/25/2023); Chronic hip pain (2ry area of Pain) (Bilateral) (R>L); Chronic hip pain after total hip replacement (THR) (Right); Cervicalgia; 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 Her primarily concern today is the Back Pain, Neck Pain, and Hip Pain (R>L)  Pain Assessment: Location: Right, Left (R>L) Hip Radiating: Denies Onset: More than a month ago Duration: Chronic pain Quality: Burning, Aching, Sharp (deep) Severity: 4 /10 (subjective, self-reported pain score)  Effect on ADL: Limits ADLs Timing: Constant Modifying factors: Laying down and heat BP: 127/74  HR: 74  Elizabeth Martin comes in today for a follow-up visit after her initial evaluation on 09/05/2024. Today we went over the results of her tests. These were explained in Layman's terms. During today's appointment we went over my diagnostic impression, as well as the proposed treatment plan.  Review of initial evaluation (09/05/2024): Elizabeth Martin is a 63 year old female with mixed connective tissue disorder and polyarthralgia who presents with chronic bilateral hip and lower back pain.   She experiences constant and debilitating bilateral hip and lower back pain. The lower back pain is centered and worsens with standing and movement. Hip pain, particularly in the right hip, is constant and exacerbated by prolonged sitting and movement.   She underwent right hip replacement surgery in December 2024 without complications. Recent x-rays were performed in her surgeon's office to check the hip replacement. She has not had any  injections for her hip pain.   She had spine surgery at the L4-5 level in 2024, which resolved previous right leg sciatica. No MRI has been performed post-surgery. A 16-week course of physical therapy improved mobility but not pain. She has not received nerve blocks or injections for her back pain.   Current medications include gabapentin  1800 mg daily, divided into three doses, and Celebrex  200 mg twice daily. Neck pain affects the trapezius muscles without associated headaches. No formal physical therapy, x-rays, or injections have been done for neck pain.  Review of diagnostic test ordered on 09/05/2024:  Diagnostic lab work: Lab work was within normal limits except for the magnesium  level that was mildly elevated at 2.4 mg/dL (normal 8.3-7.6). Diagnostic imaging: Diagnostic x-rays of the cervical spine with flexion and extension views demonstrated a 2 mm anterolisthesis of C3 to over at C3 in neutral positioning.  3 to 4 mm of an anterolisthesis in flexion and approximately 1 mm of anterolisthesis in extension.  There is approximately 2 mm of retrolisthesis of C5 over C6 in extension, 1 mm in flexion and 1 to 2 mm and neutral positioning.  Likely accentuated by uncovertebral hypertrophy at this level.  There is approximately 1 mm of retrolisthesis of C4 over C5 in extension which normalizes with neutral positioning and flexion, and likely accentuated by uncovertebral hypertrophy.  Multilevel degenerative changes of the cervical spine most pronounced at the C4-5 and C5-6 levels.  Diagnostic x-rays of both hip joints demonstrated a right total hip arthroplasty with no acute findings by plain radiographs.  Diagnostic x-rays of the lumbar spine with bending views demonstrated lumbar degenerative changes.  Unilateral right L4 pars defect with associated anterolisthesis of L4 over L5 slightly worsened with flexion.  There is disc space narrowing at L3-4 and L5-S1 with facet arthropathy.  The anterolisthesis of  L4 over L5 slightly worsens with flexion measuring up to 8 mm.  Discussed the use of AI scribe software for clinical note transcription with the patient, who gave verbal consent to proceed.  History of Present Illness   Iyonna Rish is a 63 year old female with mixed connective tissue disorder who presents for follow-up of chronic  bilateral hip and low back pain. She is accompanied by her husband, Alm.  She experiences chronic bilateral hip and low back pain, with the most significant pain in the right hip and buttocks area, especially when sitting. Despite a right hip replacement in December 2024 and spinal surgery at L4-5 in 2024, her pain persists.  Her diagnostic workup includes lumbar spine x-rays showing degenerative changes and anterolisthesis of L4 over L5, worsening with flexion. X-rays of the hips show right total hip arthroplasty with no acute findings. An EMG/PNCV from February 2024 indicates chronic right L4 radiculopathy without active denervation. She previously received transferraminal epidural steroid injections before her back surgery, which did not alleviate her symptoms.  Currently, she experiences pain primarily in the right hip area, with no radiation to the knee or foot, and no numbness in the foot. The left leg does not exhibit pain or numbness. Her hip pain worsens with use, particularly in the posterior aspect, buttocks area.      Patient presented with interventional treatment options. Ms. Pancake was informed that I will not be providing medication management. Pharmacotherapy evaluation including recommendations may be offered, if specifically requested.   Controlled Substance Pharmacotherapy Assessment REMS (Risk Evaluation and Mitigation Strategy)  Opioid Analgesic: None MME/day: 0 mg/day   Pill Count: None expected due to no prior prescriptions written by our practice. Bonner Norris, RN  10/22/2024 10:11 AM  Sign when Signing Visit Safety precautions to  be maintained throughout the outpatient stay will include: orient to surroundings, keep bed in low position, maintain call bell within reach at all times, provide assistance with transfer out of bed and ambulation.     Pharmacokinetics: Liberation and absorption (onset of action): WNL Distribution (time to peak effect): WNL Metabolism and excretion (duration of action): WNL         Pharmacodynamics: Desired effects: Analgesia: Ms. Gamino reports >50% benefit. Functional ability: Patient reports that medication allows her to accomplish basic ADLs Clinically meaningful improvement in function (CMIF): Sustained CMIF goals met Perceived effectiveness: Described as relatively effective, allowing for increase in activities of daily living (ADL) Undesirable effects: Side-effects or Adverse reactions: None reported Monitoring: [place] PMP: PDMP reviewed during this encounter. Online review of the past 22-month period previously conducted. Not applicable at this point since we have not taken over the patient's medication management yet. List of other Serum/Urine Drug Screening Test(s):  No results found for: AMPHSCRSER, BARBSCRSER, BENZOSCRSER, COCAINSCRSER, COCAINSCRNUR, PCPSCRSER, THCSCRSER, THCU, CANNABQUANT, OPIATESCRSER, OXYSCRSER, PROPOXSCRSER, ETH, CBDTHCR, D8THCCBX, D9THCCBX List of all UDS test(s) done:  Lab Results  Component Value Date   SUMMARY FINAL 09/05/2024   Last UDS on record: Summary  Date Value Ref Range Status  09/05/2024 FINAL  Final    Comment:    ==================================================================== Compliance Drug Analysis, Ur ==================================================================== Specimen Alert Note: Urinary creatinine is low; ability to detect some drugs may be compromised. Interpret results with caution. (Creatinine) ==================================================================== Test                              Result       Flag       Units  Drug Present and Declared for Prescription Verification   Gabapentin                      PRESENT      EXPECTED   Hydroxybupropion  PRESENT      EXPECTED    Hydroxybupropion is an expected metabolite of bupropion .    Venlafaxine                     PRESENT      EXPECTED   Desmethylvenlafaxine           PRESENT      EXPECTED    Desmethylvenlafaxine is an expected metabolite of venlafaxine .  Drug Present not Declared for Prescription Verification   Carboxy-THC                    2938         UNEXPECTED ng/mg creat    Carboxy-THC is a metabolite of tetrahydrocannabinol (THC). Source of    THC is most commonly herbal marijuana or marijuana-based products,    but THC is also present in a scheduled prescription medication.    Trace amounts of THC can be present in hemp and cannabidiol (CBD)    products. This test is not intended to distinguish between delta-9-    tetrahydrocannabinol, the predominant form of THC in most herbal or    marijuana-based products, and delta-8-tetrahydrocannabinol.    Dextromethorphan               PRESENT      UNEXPECTED   Dextrorphan/Levorphanol        PRESENT      UNEXPECTED    Dextrorphan is an expected metabolite of dextromethorphan, an over-    the-counter or prescription cough suppressant. Dextrorphan cannot be    distinguished from the scheduled prescription medication levorphanol    by the method used for analysis.    Guaifenesin                    PRESENT      UNEXPECTED    Guaifenesin may be administered as an over-the-counter or    prescription drug; it may also be present as a breakdown product of    methocarbamol.  Drug Absent but Declared for Prescription Verification   Trazodone                       Not Detected UNEXPECTED   Acetaminophen                   Not Detected UNEXPECTED    Acetaminophen , as indicated in the declared medication list, is not    always detected even when used as  directed.    Promethazine                    Not Detected UNEXPECTED ==================================================================== Test                      Result    Flag   Units      Ref Range   Creatinine              13        LL     mg/dL      >=79 ==================================================================== Declared Medications:  The flagging and interpretation on this report are based on the  following declared medications.  Unexpected results may arise from  inaccuracies in the declared medications.   **Note: The testing scope of this panel includes these medications:   Bupropion  (Wellbutrin  XL)  Gabapentin  (Neurontin )  Promethazine  (Phenergan )  Trazodone  (Desyrel )  Venlafaxine  (Effexor )   **Note: The testing scope of this panel does not  include small to  moderate amounts of these reported medications:   Acetaminophen  (Tylenol )   **Note: The testing scope of this panel does not include the  following reported medications:   Albuterol  (Ventolin  HFA)  Aluminum hydroxide  Amoxicillin (Amoxil)  Budesonide (Symbicort)  Buspirone  (Buspar )  Celecoxib  (Celebrex )  Cephalexin  (Keflex )  Cetirizine (Zyrtec)  Famotidine  (Pepcid )  Folic Acid (Folvite)  Formoterol (Symbicort)  Hydroxychloroquine  (Plaquenil )  Lisinopril  (Zestril )  Lubiprostone (Amitiza)  Magnesium   Methotrexate  Metronidazole  (Flagyl )  Nystatin  (Mycostatin )  Omeprazole (Prilosec)  Ondansetron  (Zofran )  Phenazopyridine (Pyridium)  Polyethylene Glycol (MiraLAX )  Polymyxin B (Polytrim)  Prednisone (Deltasone)  Scopolamine   Topical  Trimethoprim (Polytrim)  Trospium  (Sanctura ) ==================================================================== For clinical consultation, please call [phone]. ====================================================================    UDS interpretation: No unexpected findings.          Medication Assessment Form: Not applicable. No opioids. Treatment  compliance: Not applicable Risk Assessment Profile: Aberrant behavior: See initial evaluations. None observed or detected today Comorbid factors increasing risk of overdose: See initial evaluation. No additional risks detected today Opioid risk tool (ORT):     09/05/2024    8:12 AM  Opioid Risk   Alcohol 0  Illegal Drugs 0  Rx Drugs 0  Alcohol 0  Illegal Drugs 0  Rx Drugs 0  Psychological Disease 2  Depression 0  Opioid Risk Tool Scoring 2  Opioid Risk Interpretation Low Risk    ORT Scoring interpretation table:  Score <3 = Low Risk for SUD  Score between 4-7 = Moderate Risk for SUD  Score >8 = High Risk for Opioid Abuse   Risk of substance use disorder (SUD): Low  Risk Mitigation Strategies:  Patient opioid safety counseling: No controlled substances prescribed. Patient-Prescriber Agreement (PPA): No agreement signed.  Controlled substance notification to other providers: None required. No opioid therapy.  Pharmacologic Plan: Non-opioid analgesic therapy offered. Interventional alternatives discussed.             Laboratory Chemistry Profile   Renal Lab Results  Component Value Date   BUN 14 09/05/2024   CREATININE 0.72 09/05/2024   BCR 19 09/05/2024   GFRNONAA >60 01/09/2024   SPECGRAV 1.010 08/12/2023   PHUR 7.0 08/12/2023   PROTEINUR NEGATIVE 01/09/2024     Electrolytes Lab Results  Component Value Date   NA 139 09/05/2024   K 4.8 09/05/2024   CL 99 09/05/2024   CALCIUM 9.3 09/05/2024   MG 2.4 (H) 09/05/2024   PHOS 3.6 02/12/2023     Hepatic Lab Results  Component Value Date   AST 16 09/05/2024   ALT 11 01/09/2024   ALBUMIN 4.1 09/05/2024   ALKPHOS 62 09/05/2024   LIPASE 30 01/09/2024     ID Lab Results  Component Value Date   HIV Non Reactive 01/10/2024   SARSCOV2NAA NEGATIVE 02/11/2023   STAPHAUREUS NEGATIVE 11/01/2023   MRSAPCR NEGATIVE 11/01/2023     Bone Lab Results  Component Value Date   25OHVITD1 38 09/05/2024   25OHVITD2 1.4  09/05/2024   25OHVITD3 37 09/05/2024     Endocrine Lab Results  Component Value Date   GLUCOSE 85 09/05/2024   GLUCOSEU NEGATIVE 01/09/2024   TSH 2.066 12/21/2023     Neuropathy Lab Results  Component Value Date   VITAMINB12 246 09/05/2024   HIV Non Reactive 01/10/2024     CNS No results found for: COLORCSF, APPEARCSF, RBCCOUNTCSF, WBCCSF, POLYSCSF, LYMPHSCSF, EOSCSF, PROTEINCSF, GLUCCSF, JCVIRUS, CSFOLI, IGGCSF, LABACHR, ACETBL   Inflammation (CRP: Acute  ESR: Chronic) Lab Results  Component Value Date   CRP 2 09/05/2024   ESRSEDRATE 6 09/05/2024   LATICACIDVEN 1.5 05/18/2022     Rheumatology No results found for: RF, ANA, LABURIC, URICUR, LYMEIGGIGMAB, LYMEABIGMQN, HLAB27   Coagulation Lab Results  Component Value Date   INR 1.0 05/23/2022   LABPROT 13.2 05/23/2022   PLT 421 (H) 01/09/2024     Cardiovascular Lab Results  Component Value Date   HGB 13.2 01/09/2024   HCT 41.2 01/09/2024     Screening Lab Results  Component Value Date   SARSCOV2NAA NEGATIVE 02/11/2023   STAPHAUREUS NEGATIVE 11/01/2023   MRSAPCR NEGATIVE 11/01/2023   HIV Non Reactive 01/10/2024     Cancer No results found for: CEA, CA125, LABCA2   Allergens No results found for: ALMOND, APPLE, ASPARAGUS, AVOCADO, BANANA, BARLEY, BASIL, BAYLEAF, GREENBEAN, LIMABEAN, WHITEBEAN, BEEFIGE, REDBEET, BLUEBERRY, BROCCOLI, CABBAGE, MELON, CARROT, CASEIN, CASHEWNUT, CAULIFLOWER, CELERY     Note: Lab results reviewed.  Recent Diagnostic Imaging Review  Cervical Imaging: Cervical DG Bending/F/E views: Results for orders placed during the hospital encounter of 09/05/24 DG Cervical Spine With Flex & Extend  Narrative CLINICAL DATA:  Cervicalgia, chronic neck pain  EXAM: DG CERVICAL SPINE COMP WITH FLEX  COMPARISON:  None Available.  FINDINGS: The cervical spine is visualized from C1-the superior  endplate of C7. There is 2 mm of anterolisthesis of C2-3 in neutral positioning, 3 to 4 mm of anterolisthesis in flexion and approximately 1 mm of anterolisthesis in extension. There is approximately 2 mm of retrolisthesis of C5-6 in extension, 1 mm in flexion and 1-2 mm in neutral positioning, likely accentuated by uncovertebral hypertrophy at this level. There is approximately 1 mm of retrolisthesis of C4-5 in extension which normalizes with neutral positioning and flexion. No acute compression fracture deformity. Moderate intervertebral disc space height loss at C4-5 and C5-6. Multilevel facet arthropathy and uncovertebral hypertrophy results in mild LEFT-sided osseous neuroforaminal narrowing to at bilateral C4-5 and C5-6. No prevertebral soft tissue swelling. Visualized thorax is unremarkable.  IMPRESSION: 1. There is 2 mm of anterolisthesis of C2-3 in neutral positioning, 3 to 4 mm of anterolisthesis in flexion and approximately 1 mm of anterolisthesis in extension. 2. There is approximately 2 mm of retrolisthesis of C5-6 in extension, 1 mm in flexion and 1-2 mm in neutral positioning, likely accentuated by uncovertebral hypertrophy at this level. 3. There is approximately 1 mm of retrolisthesis of C4-5 in extension which normalizes with neutral positioning and flexion, likely accentuated by uncovertebral hypertrophy. 4. Multilevel degenerative changes of the cervical spine most pronounced at C4-5 and C5-6.   Electronically Signed By: Corean Salter M.D. On: 09/10/2024 08:01  Shoulder Imaging: Shoulder-L MR wo contrast: Results for orders placed during the hospital encounter of 12/14/21 MR SHOULDER LEFT WO CONTRAST  Narrative CLINICAL DATA:  Left shoulder pain for 6 months.  EXAM: MRI OF THE LEFT SHOULDER WITHOUT CONTRAST  TECHNIQUE: Multiplanar, multisequence MR imaging of the shoulder was performed. No intravenous contrast was administered.  COMPARISON:   None.  FINDINGS: Rotator cuff: Mild tendinosis of the supraspinatus, infraspinatus, and subscapularis tendons without discrete tear.  Muscles: Preserved bulk and signal intensity of the rotator cuff musculature without edema, atrophy, or fatty infiltration.  Biceps long head:  Intact and appropriately positioned.  Acromioclavicular Joint: Mild degenerative changes of the AC joint. No significant subacromial-subdeltoid bursal fluid.  Glenohumeral Joint: No significant joint effusion. Mild chondral thinning without focal defect.  Labrum: Grossly intact although evaluation is limited in the absence of intra-articular  fluid. No paralabral cyst.  Bones: No acute fracture. No dislocation. No bone marrow edema. No marrow replacing bone lesion.  Other: None.  IMPRESSION: 1. Mild rotator cuff tendinosis without discrete tear. 2. Mild acromioclavicular and glenohumeral osteoarthritis.   Electronically Signed By: Mabel Converse D.O. On: 12/15/2021 10:48  Lumbosacral Imaging: Lumbar DG Bending views: Results for orders placed during the hospital encounter of 09/05/24 DG Lumbar Spine Complete W/Bend  Narrative CLINICAL DATA:  Low back pain  EXAM: LUMBAR SPINE - COMPLETE WITH BENDING VIEWS  COMPARISON:  01/09/2024 CT reconstructions  FINDINGS: Normal alignment and preserved vertebral body heights. No acute osseous finding, fracture, compression deformity, or focal kyphosis. Disc space narrowing and L3-4 and L5-S1. Mild facet arthropathy and L5-S1. Unilateral right pars defects at L4. Associated anterolisthesis of L4 on L5 slightly worse with flexion measuring up to 8 mm.  IMPRESSION: 1. Lumbar degenerative changes as above. 2. Unilateral right L4 pars defects with associated anterolisthesis of L4 on L5 slightly worse with flexion.   Electronically Signed By: CHRISTELLA.  Shick M.D. On: 09/09/2024 19:30  Hip Imaging: Hip-R DG 2-3 views: Results for orders placed during the  hospital encounter of 12/04/22 DG Hip Unilat W or Wo Pelvis 2-3 Views Right  Narrative CLINICAL DATA:  Right hip pain.  No recent injury.  EXAM: DG HIP (WITH OR WITHOUT PELVIS) 2-3V RIGHT  COMPARISON:  CT abdomen and pelvis and reconstructed images of 11/05/2022  FINDINGS: There is mild joint space loss of both hips and trace femoral head spurring. No pelvic fracture or diastasis or hip fracture are seen. There is mild pelvic enthesopathy. Intestinal postsurgical changes are again noted in the pelvis.  IMPRESSION: Mild degenerative change without evidence of fractures. Mild pelvic enthesopathy.   Electronically Signed By: Francis Quam M.D. On: 12/04/2022 07:44  Hip-B DG Bilateral (5V): Results for orders placed during the hospital encounter of 09/05/24 DG HIPS BILAT W OR W/O PELVIS MIN 5 VIEWS  Narrative CLINICAL DATA:  Chronic bilateral hip pain, previous right hip arthroplasty  EXAM: DG HIP (WITH OR WITHOUT PELVIS) 5+V BILAT  COMPARISON:  12/04/2022  FINDINGS: Right total hip arthroplasty changes noted. No malalignment, acute osseous finding or hardware abnormality. Bony pelvis and left hip intact. Slight joint space loss in the left hip without acute osseous finding. SI joints are maintained. Degenerative changes of the lumbosacral spine.  IMPRESSION: 1. Right total hip arthroplasty changes. 2. No acute finding by plain radiography.   Electronically Signed By: CHRISTELLA.  Shick M.D. On: 09/09/2024 19:31  Complexity Note: Imaging results reviewed.                         Meds   Current Outpatient Medications:    acetaminophen  (TYLENOL ) 500 MG tablet, Take 2 tablets (1,000 mg total) by mouth every 8 (eight) hours., Disp: 30 tablet, Rfl: 0   albuterol  (VENTOLIN  HFA) 108 (90 Base) MCG/ACT inhaler, Inhale 2 puffs into the lungs every 6 (six) hours as needed for shortness of breath or wheezing., Disp: , Rfl:    Alum Hydroxide-Mag Carbonate (GAVISCON PO), Take 4  tablets by mouth daily as needed (upset stomach)., Disp: , Rfl:    amoxicillin (AMOXIL) 500 MG capsule, SMARTSIG:4 Capsule(s) By Mouth Once, Disp: , Rfl:    buPROPion  (WELLBUTRIN  XL) 150 MG 24 hr tablet, TAKE 1 TABLET (150 MG TOTAL) BY MOUTH IN THE MORNING, Disp: 90 tablet, Rfl: 0   celecoxib  (CELEBREX ) 200 MG capsule, Take 200  mg by mouth 2 (two) times daily., Disp: , Rfl:    cetirizine (ZYRTEC) 10 MG tablet, Take 10 mg by mouth daily., Disp: , Rfl:    CLOBETASOL PROPIONATE E 0.05 % emollient cream, Apply topically., Disp: , Rfl:    desonide (DESOWEN) 0.05 % cream, Apply 1 Application topically 2 (two) times daily as needed (morphea flare)., Disp: , Rfl:    famotidine -calcium carbonate-magnesium  hydroxide (PEPCID  COMPLETE) 10-800-165 MG chewable tablet, Chew 1 tablet by mouth daily as needed (heartburn)., Disp: , Rfl:    folic acid (FOLVITE) 1 MG tablet, Take 1 mg by mouth daily., Disp: , Rfl:    gabapentin  (NEURONTIN ) 300 MG capsule, Take 600 mg by mouth 3 (three) times daily., Disp: , Rfl:    hydrocortisone-pramoxine (ANALPRAM-HC) 2.5-1 % rectal cream, Place 1 Application rectally 3 (three) times daily as needed for hemorrhoids., Disp: , Rfl:    hydroxychloroquine  (PLAQUENIL ) 200 MG tablet, Take 200 mg by mouth 2 (two) times daily., Disp: , Rfl:    lisinopril  (ZESTRIL ) 40 MG tablet, Take 40 mg by mouth daily., Disp: , Rfl:    methotrexate (RHEUMATREX) 2.5 MG tablet, Take 10 mg by mouth 2 (two) times a week. Take 10 mg on Monday and Tuesday, Disp: , Rfl:    omeprazole (PRILOSEC) 40 MG capsule, Take 40 mg by mouth daily., Disp: , Rfl:    ondansetron  (ZOFRAN -ODT) 4 MG disintegrating tablet, Take 4 mg by mouth every 8 (eight) hours as needed for nausea or vomiting., Disp: , Rfl:    ondansetron  (ZOFRAN -ODT) 8 MG disintegrating tablet, Take 8 mg by mouth every 8 (eight) hours as needed., Disp: , Rfl:    polyethylene glycol (MIRALAX  / GLYCOLAX ) 17 g packet, Take 17 g by mouth., Disp: , Rfl:     promethazine  (PHENERGAN ) 12.5 MG tablet, Take 12.5 mg by mouth every 8 (eight) hours as needed., Disp: , Rfl:    scopolamine  (TRANSDERM-SCOP) 1 MG/3DAYS, Place 1 mg onto the skin., Disp: , Rfl:    silver sulfADIAZINE (SILVADENE) 1 % cream, Apply 1 Application topically daily., Disp: , Rfl:    traZODone  (DESYREL ) 50 MG tablet, TAKE 1 TABLET BY MOUTH AT BEDTIME AS NEEDED FOR SLEEP., Disp: 90 tablet, Rfl: 0   triamcinolone (KENALOG) 0.025 % cream, Apply 1 Application topically 2 (two) times daily as needed (morphea flare)., Disp: , Rfl:    trospium  (SANCTURA ) 20 MG tablet, Take 1 tablet (20 mg total) by mouth 2 (two) times daily as needed (overactive bladder)., Disp: 180 tablet, Rfl: 1   venlafaxine  XR (EFFEXOR -XR) 75 MG 24 hr capsule, Take 1 capsule (75 mg total) by mouth daily with breakfast., Disp: 90 capsule, Rfl: 3  ROS  Constitutional: Denies any fever or chills Gastrointestinal: No reported hemesis, hematochezia, vomiting, or acute GI distress Musculoskeletal: Denies any acute onset joint swelling, redness, loss of ROM, or weakness Neurological: No reported episodes of acute onset apraxia, aphasia, dysarthria, agnosia, amnesia, paralysis, loss of coordination, or loss of consciousness  Allergies  Ms. Jakubek is allergic to carboplatin, hydromorphone , latex, nitrofurantoin, silicone, codeine, other, wheat, and oxycodone .  PFSH  Drug: Ms. Lipuma  reports no history of drug use. Alcohol:  reports current alcohol use. Tobacco:  reports that she has never smoked. She has never used smokeless tobacco. Medical:  has a past medical history of Anxiety, Asthma, Autoimmune disease, Cancer (HCC), Corneal abrasion, right (05/24/2022), Depression, GERD (gastroesophageal reflux disease), H/O blood clots, History of hiatal hernia, Hypertension, Lymphedema, Morphea scleroderma, and PONV (postoperative nausea  and vomiting). Surgical: Ms. Issa  has a past surgical history that includes Tonsillectomy;  Cholecystectomy (2008); Hernia repair (2004); Plantar fasciectomy; Excision basal cell carcinoma (Left); Flexible sigmoidoscopy (N/A, 05/19/2022); Robotic assisted total hysterectomy with bilateral salpingo oophorectomy (02/14/2012); Rotator cuff repair (Left, 03/24/2022); XI robotic assisted lower anterior resection (N/A, 05/24/2022); Ileostomy closure (N/A, 08/24/2022); IVC FILTER INSERTION (N/A, 11/09/2023); Total hip arthroplasty (Right, 11/14/2023); and IVC FILTER REMOVAL (N/A, 01/02/2024). Family: family history is not on file.  Constitutional Exam  General appearance: Well nourished, well developed, and well hydrated. In no apparent acute distress Vitals:   10/22/24 1013  BP: 127/74  Pulse: 74  Resp: 16  Temp: (!) 97.4 F (36.3 C)  TempSrc: Temporal  SpO2: 97%  Weight: 175 lb (79.4 kg)  Height: 5' 7 (1.702 m)   BMI Assessment: Estimated body mass index is 27.41 kg/m as calculated from the following:   Height as of this encounter: 5' 7 (1.702 m).   Weight as of this encounter: 175 lb (79.4 kg).  BMI interpretation table: BMI level Category Range association with higher incidence of chronic pain  <18 kg/m2 Underweight   18.5-24.9 kg/m2 Ideal body weight   25-29.9 kg/m2 Overweight Increased incidence by 20%  30-34.9 kg/m2 Obese (Class I) Increased incidence by 68%  35-39.9 kg/m2 Severe obesity (Class II) Increased incidence by 136%  >40 kg/m2 Extreme obesity (Class III) Increased incidence by 254%   Patient's current BMI Ideal Body weight  Body mass index is 27.41 kg/m. Ideal body weight: 61.6 kg (135 lb 12.9 oz) Adjusted ideal body weight: 68.7 kg (151 lb 7.7 oz)   BMI Readings from Last 4 Encounters:  10/22/24 27.41 kg/m  09/05/24 28.19 kg/m  01/09/24 23.48 kg/m  12/21/23 27.08 kg/m   Wt Readings from Last 4 Encounters:  10/22/24 175 lb (79.4 kg)  09/05/24 180 lb (81.6 kg)  01/09/24 149 lb 14.6 oz (68 kg)  12/21/23 167 lb 12.8 oz (76.1 kg)   Psych/Mental status:  Alert, oriented x 3 (person, place, & time)       Eyes: PERLA Respiratory: No evidence of acute respiratory distress  Assessment & Plan  Primary Diagnosis & Pertinent Problem List: The primary encounter diagnosis was Chronic low back pain (1ry area of Pain) (Bilateral) (R>L) w/o sciatica. 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 Visit Diagnosis: 1. Chronic low back pain (1ry area of Pain) (Bilateral) (R>L) w/o sciatica   2. Chronic hip pain (2ry area of Pain) (Bilateral) (R>L)   3. Chronic neck pain (3ry area of Pain) (Bilateral)   4. Abnormal MRI, lumbar spine (01/10/2023) ([facility])   5. Abnormal NCS (nerve conduction studies) (01/25/2023)   6. 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 Cervical Grade 1 Retrolisthesis of (C4-5, C5-6)   8. Cervical Anterolisthesis of (C2-3) (dynamic)   9. Lumbar pars defect (Right) (L4) (postsurgical)   10. Lumbar Anterolisthesis of (L4/L5) (8 mm on flexion) (Dynamic)   11. History of total hip arthroplasty (THR) (Right)   12. Anticoagulated by anticoagulation  treatment (Lovenox )   13. 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 There is 2 mm of anterolisthesis of C2-3 in neutral positioning, 3 to 4 mm of anterolisthesis in flexion and approximately 1 mm of anterolisthesis in extension.   Cervical Grade 1 Retrolisthesis of (C4-5, C5-6)   (09/10/2024) DIAGNOSTIC X-RAYS OF CERVICAL SPINE WITH FLEXION AND EXTENSION MPRESSION: 1. There is approximately 2 mm of retrolisthesis of C5-6 in extension, 1 mm in flexion and 1-2 mm in neutral positioning, likely accentuated by uncovertebral hypertrophy at this level. 2. There is approximately 1 mm of retrolisthesis of C4-5 in extension which normalizes with neutral positioning and flexion, likely accentuated by uncovertebral hypertrophy. 3. Multilevel degenerative changes of the cervical spine most pronounced at C4-5 and C5-6.   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 A previous right-sided L4/5 microdiscectomy was performed. X-rays reveal multilevel degenerative changes with anterolisthesis of L4 over L5, worsened with flexion. Clinical symptoms suggest facet joint involvement, though  differential includes central canal stenosis and foraminal stenosis. A diagnostic lumbar facet block with local anesthetic and steroid will be performed to assess the pain source and potential long-term benefit from the steroid. Lovenox  will be stopped 24 hours before the procedure and restarted 4 hours after. A pain diary will be provided for post-procedure assessment. Sedation will be arranged during the procedure with IV access for safety.  Chronic right hip pain after right total hip arthroplasty   Chronic right hip pain persists post total hip arthroplasty, primarily in the buttocks area, aggravated by use and unresponsive to previous hip injections. Pain may be related to lumbar spine issues, particularly at L3/4 and L4/5 levels, which innervate the hip joint. The lumbar spine will be evaluated as the primary source of hip pain through a diagnostic lumbar facet block.  History of recent fall with short-term anticoagulation   She experienced a fall on October 28th, 2025, leading to the initiation of Lovenox  for anticoagulation due to a history of blood clots and recent travel. Lovenox  was prescribed as a precautionary measure and will be continued as prescribed until completion.        Pharmacotherapy (Medications Ordered): No orders of the defined types were placed in this encounter.  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 2.Pain present for longer than 3 months (Chronic) documented to have failed noninvasive conservative therapies. 3.Absence of untreated radiculopathy. 4.There is no radiological evidence of  untreated fractures, tumor, infection, or deformity.  Physical Examination Findings: Positive Kemp Maneuver: (Y)  Positive Lumbar Hyperextension-Rotation provocative test: (Y)    Standing Status:   Future    Expiration Date:   01/22/2025    Scheduling Instructions:     Procedure: Lumbar facet Block     Type: Medial Branch Block     Side: Bilateral     Purpose: Diagnostic/Therapeutic     Level(s): L3-4, L4-5, L5-S1, and TBD by Fluoroscopic Mapping Facets (L2, L3, L4, L5, S1, and TBD Medial Branch)     Sedation: With Sedation.     Timeframe: After she is done with the Lovenox     Where will this procedure be performed?:   [facility]   Blood Thinner Instructions to Nursing    Always make sure patient has clearance from prescribing physician to stop blood thinners for interventional therapies. If the patient requires a Lovenox -bridge therapy, make sure arrangements are made to institute it with the assistance of the PCP.    Scheduling Instructions:     Have Ms. Fleury stop the Plaquenil  (Hydroxychloroquine ) x 11 days prior to procedure or surgery.   Lab Orders  No laboratory test(s) ordered today   Imaging Orders  No imaging studies ordered today   Referral Orders  No referral(s) requested today   Pharmacological management:  Opioid Analgesics: I will not be prescribing any opioids at this time Membrane stabilizer: I will not be prescribing any at this time Muscle relaxant: I will not be prescribing any at this time NSAID: I will not be prescribing any at this time Other analgesic(s): I will not be prescribing any at this time      Interventional Therapies  Risk Factors  Considerations  Medical Comorbidities:  Plaquenil  Anticoagulation: (Stop: 11 days)  (09/05/2024) UDS (+) carboxy-THC  ALLERGY: Latex, hydromorphone , codeine, oxycodone , silicone  Hx. Cancer (Skin, Uterus, Endometrium)  GERD  HTN  BA  Hashimoto's Disease  PTSD  GAD  Raynaud's Disease  Hx.  Bowel Obstruction w/ partial colectomy  MCTD (mixed connective tissue disease)  Polyarthralgia  Hx. DVT     Planned  Pending:   Diagnostic bilateral lumbar facet MBB #1    Under consideration:   Diagnostic bilateral lumbar facet MBB #1    Completed: (Analgesic benefit)1  None at this time   Therapeutic  Palliative (PRN) options:   None established   Completed by other providers:   Diagnostic LE EMG/PNCV (01/25/2023) by Luetta Sport, MD [facility]) Dx: Chronic right L4 radiculopathy without active denervation. (Pre-operative) Surgery: Right L4-5 microdiscectomy (04/11/2023) by Lugene Ozell Faden, MD [facility] Neurosurgery) Therapeutic left hip injection x1 (08/30/2023) by Prentice Reges, DO ([facility])  Therapeutic right hip injection x1 (08/12/2023) by Prentice Reges, DO ([facility])  Diagnostic/therapeutic right L4 & L5 transforaminal ESI x1 (02/21/2023) by Franky Ambrosia, DO [facility])   1(Analgesic benefit): Expressed in percentage (%). (Local anesthetic[LA] +/- sedation  L.A.Local Anesthetic  Steroid benefit  Ongoing benefit)      Provider-requested follow-up: Return for (ECT):(B) L-FCT Blk #1, (Blood Thinner Protocol). Recent Visits Date Type Provider Dept  09/05/24 Office Visit Tanya Glisson, MD [facility]  Showing recent visits within past  90 days and meeting all other requirements Today's Visits Date Type Provider Dept  10/22/24 Office Visit Tanya Glisson, MD [facility]  Showing today's visits and meeting all other requirements Future Appointments No visits were found meeting these conditions. Showing future appointments within next 90 days and meeting all other requirements   Primary Care Physician: Tobie Border, MD  Duration of encounter: 69 minutes.  Total time on encounter, as per AMA guidelines included both the face-to-face and non-face-to-face time personally spent by the physician and/or other qualified health care  professional(s) on the day of the encounter (includes time in activities that require the physician or other qualified health care professional and does not include time in activities normally performed by clinical staff). Physician's time may include the following activities when performed: Preparing to see the patient (e.g., pre-charting review of records, searching for previously ordered imaging, lab work, and nerve conduction tests) Review of prior analgesic pharmacotherapies. Reviewing PMP Interpreting ordered tests (e.g., lab work, imaging, nerve conduction tests) Performing post-procedure evaluations, including interpretation of diagnostic procedures Obtaining and/or reviewing separately obtained history Performing a medically appropriate examination and/or evaluation Counseling and educating the patient/family/caregiver Ordering medications, tests, or procedures Referring and communicating with other health care professionals (when not separately reported) Documenting clinical information in the electronic or other health record Independently interpreting results (not separately reported) and communicating results to the patient/ family/caregiver Care coordination (not separately reported)  Note by: Glisson DELENA Tanya, MD (TTS technology used. I apologize for any typographical errors that were not detected and corrected.) Date: 10/22/2024; Time: 12:53 PM

## 2024-10-22 NOTE — Patient Instructions (Addendum)
 Stop Plaquinil x 11 days for procedure  ______________________________________________________________________    Blood Thinners  IMPORTANT NOTICE:  If you take any of these, make sure to notify the nursing staff.  Failure to do so may result in serious injury.  Recommended time intervals to stop and restart blood-thinners, before & after invasive procedures  Generic Name Brand Name Pre-procedure: Stop medication for this amount of time before your procedure: Post-procedure: Wait this amount of time after the procedure before restarting your medication:  Abciximab Reopro 15 days 2 hrs  Alteplase Activase 10 days 10 days  Anagrelide Agrylin    Apixaban  Eliquis  3 days 6 hrs  Cilostazol Pletal 3 days 5 hrs  Clopidogrel  Plavix  7-10 days 2 hrs  Dabigatran Pradaxa 5 days 6 hrs  Dalteparin Fragmin 24 hours 4 hrs  Dipyridamole Aggrenox 11days 2 hrs  Edoxaban Lixiana; Savaysa 3 days 2 hrs  Enoxaparin   Lovenox  24 hours 4 hrs  Eptifibatide Integrillin 8 hours 2 hrs  Fondaparinux  Arixtra 72 hours 12 hrs  Hydroxychloroquine  Plaquenil  11 days   Prasugrel Effient 7-10 days 6 hrs  Reteplase Retavase 10 days 10 days  Rivaroxaban Xarelto 3 days 6 hrs  Ticagrelor Brilinta 5-7 days 6 hrs  Ticlopidine Ticlid 10-14 days 2 hrs  Tinzaparin Innohep 24 hours 4 hrs  Tirofiban Aggrastat 8 hours 2 hrs  Warfarin Coumadin 5 days 2 hrs   Other medications with blood-thinning effects  NOTE: Consider stopping these if you have prolonged bleeding despite not taking any of the above blood thinners. Otherwise ask your provider and this will be decided on a case-by-case basis.  Product indications Generic (Brand) names Note  Cholesterol Lipitor Stop 4 days before procedure  Blood thinner (injectable) Heparin  (LMW or LMWH Heparin ) Stop 24 hours before procedure  Cancer Ibrutinib (Imbruvica) Stop 7 days before procedure  Malaria/Rheumatoid Hydroxychloroquine  (Plaquenil ) Stop 11 days before procedure   Thrombolytics  10 days before or after procedures   Over-the-counter (OTC) Products with blood-thinning effects  NOTE: Consider stopping these if you have prolonged bleeding despite not taking any of the above blood thinners. Otherwise ask your provider and this will be decided on a case-by-case basis.  Product Common names Stop Time  Aspirin > 325 mg Goody Powders, Excedrin, etc. 11 days  Aspirin <= 81 mg  7 days  Fish oil  4 days  Garlic supplements  7 days  Ginkgo biloba  36 hours  Ginseng  24 hours  NSAIDs Ibuprofen , Naprosyn, etc. 3 days  Vitamin E  4 days   ______________________________________________________________________     ______________________________________________________________________    Procedure instructions  Stop blood-thinners  Do not eat or drink fluids (other than water ) for 6 hours before your procedure  No water  for 2 hours before your procedure  Take your blood pressure medicine with a sip of water   Arrive 30 minutes before your appointment  If sedation is planned, bring suitable driver. [place], [place], & public transportation are NOT APPROVED)  Carefully read the Preparing for your procedure detailed instructions  If you have questions call us  at [phone]  Procedure appointments are for procedures only.   NO medication refills or new problem evaluations will be done on procedure days.   Only the scheduled, pre-approved procedure and side will be done.   ______________________________________________________________________     ______________________________________________________________________    Preparing for your procedure  Appointments: If you think you may not be able to keep your appointment, call 24-48 hours in advance to cancel. We  need time to make it available to others.  Procedure visits are for procedures only. During your procedure appointment there will be: NO Prescription Refills*. NO medication changes or  discussions*. NO discussion of disability issues*. NO unrelated pain problem evaluations*. NO evaluations to order other pain procedures*. *These will be addressed at a separate and distinct evaluation encounter on the provider's evaluation schedule and not during procedure days.  Instructions: Food intake: Avoid eating anything solid for at least 8 hours prior to your procedure. Clear liquid intake: You may take clear liquids such as water  up to 2 hours prior to your procedure. (No carbonated drinks. No soda.) Transportation: Unless otherwise stated by your physician, bring a driver. (Driver cannot be a Market Researcher, Pharmacist, Community, or any other form of public transportation.) Morning Medicines: Except for blood thinners, take all of your other morning medications with a sip of water . Make sure to take your heart and blood pressure medicines. If your blood pressure's lower number is above 100, the case will be rescheduled. Blood thinners: Make sure to stop your blood thinners as instructed.  If you take a blood thinner, but were not instructed to stop it, call our office [phone] and ask to talk to a nurse. Not stopping a blood thinner prior to certain procedures could lead to serious complications. Diabetics on insulin: Notify the staff so that you can be scheduled 1st case in the morning. If your diabetes requires high dose insulin, take only  of your normal insulin dose the morning of the procedure and notify the staff that you have done so. Preventing infections: Shower with an antibacterial soap the morning of your procedure.  Build-up your immune system: Take 1000 mg of Vitamin C with every meal (3 times a day) the day prior to your procedure. Antibiotics: Inform the nursing staff if you are taking any antibiotics or if you have any conditions that may require antibiotics prior to procedures. (Example: recent joint implants)   Pregnancy: If you are pregnant make sure to notify the nursing staff. Not  doing so may result in injury to the fetus, including death.  Sickness: If you have a cold, fever, or any active infections, call and cancel or reschedule your procedure. Receiving steroids while having an infection may result in complications. Arrival: You must be in the facility at least 30 minutes prior to your scheduled procedure. Tardiness: Your scheduled time is also the cutoff time. If you do not arrive at least 15 minutes prior to your procedure, you will be rescheduled.  Children: Do not bring any children with you. Make arrangements to keep them home. Dress appropriately: There is always a possibility that your clothing may get soiled. Avoid long dresses. Valuables: Do not bring any jewelry or valuables.  Reasons to call and reschedule or cancel your procedure: (Following these recommendations will minimize the risk of a serious complication.) Surgeries: Avoid having procedures within 2 weeks of any surgery. (Avoid for 2 weeks before or after any surgery). Flu Shots: Avoid having procedures within 2 weeks of a flu shots or . (Avoid for 2 weeks before or after immunizations). Barium: Avoid having a procedure within 7-10 days after having had a radiological study involving the use of radiological contrast. (Myelograms, Barium swallow or enema study). Heart attacks: Avoid any elective procedures or surgeries for the initial 6 months after a Myocardial Infarction (Heart Attack). Blood thinners: It is imperative that you stop these medications before procedures. Let us  know if you if you take  any blood thinner.  Infection: Avoid procedures during or within two weeks of an infection (including chest colds or gastrointestinal problems). Symptoms associated with infections include: Localized redness, fever, chills, night sweats or profuse sweating, burning sensation when voiding, cough, congestion, stuffiness, runny nose, sore throat, diarrhea, nausea, vomiting, cold or Flu symptoms, recent or  current infections. It is specially important if the infection is over the area that we intend to treat. Heart and lung problems: Symptoms that may suggest an active cardiopulmonary problem include: cough, chest pain, breathing difficulties or shortness of breath, dizziness, ankle swelling, uncontrolled high or unusually low blood pressure, and/or palpitations. If you are experiencing any of these symptoms, cancel your procedure and contact your primary care physician for an evaluation.  Remember:  Regular Business hours are:  Monday to Thursday 8:00 AM to 4:00 PM  Provider's Schedule: Eric Como, MD:  Procedure days: Tuesday and Thursday 7:30 AM to 4:00 PM  Wallie Sherry, MD:  Procedure days: Monday and Wednesday 7:30 AM to 4:00 PM Last  Updated: 11/15/2023 ______________________________________________________________________     ______________________________________________________________________    General Risks and Possible Complications  Patient Responsibilities: It is important that you read this as it is part of your informed consent. It is our duty to inform you of the risks and possible complications associated with treatments offered to you. It is your responsibility as a patient to read this and to ask questions about anything that is not clear or that you believe was not covered in this document.  Patient's Rights: You have the right to refuse treatment. You also have the right to change your mind, even after initially having agreed to have the treatment done. However, under this last option, if you wait until the last second to change your mind, you may be charged for the materials used up to that point.  Introduction: Medicine is not an visual merchandiser. Everything in Medicine, including the lack of treatment(s), carries the potential for danger, harm, or loss (which is by definition: Risk). In Medicine, a complication is a secondary problem, condition, or disease that can  aggravate an already existing one. All treatments carry the risk of possible complications. The fact that a side effects or complications occurs, does not imply that the treatment was conducted incorrectly. It must be clearly understood that these can happen even when everything is done following the highest safety standards.  No treatment: You can choose not to proceed with the proposed treatment alternative. The "PRO(s)" would include: avoiding the risk of complications associated with the therapy. The "CON(s)" would include: not getting any of the treatment benefits. These benefits fall under one of three categories: diagnostic; therapeutic; and/or palliative. Diagnostic benefits include: getting information which can ultimately lead to improvement of the disease or symptom(s). Therapeutic benefits are those associated with the successful treatment of the disease. Finally, palliative benefits are those related to the decrease of the primary symptoms, without necessarily curing the condition (example: decreasing the pain from a flare-up of a chronic condition, such as incurable terminal cancer).  General Risks and Complications: These are associated to most interventional treatments. They can occur alone, or in combination. They fall under one of the following six (6) categories: no benefit or worsening of symptoms; bleeding; infection; nerve damage; allergic reactions; and/or death. No benefits or worsening of symptoms: In Medicine there are no guarantees, only probabilities. No healthcare provider can ever guarantee that a medical treatment will work, they can only state the probability that it  may. Furthermore, there is always the possibility that the condition may worsen, either directly, or indirectly, as a consequence of the treatment. Bleeding: This is more common if the patient is taking a blood thinner, either prescription or over the counter (example: Goody Powders, Fish oil, Aspirin, Garlic,  etc.), or if suffering a condition associated with impaired coagulation (example: Hemophilia, cirrhosis of the liver, low platelet counts, etc.). However, even if you do not have one on these, it can still happen. If you have any of these conditions, or take one of these drugs, make sure to notify your treating physician. Infection: This is more common in patients with a compromised immune system, either due to disease (example: diabetes, cancer, human immunodeficiency virus [HIV], etc.), or due to medications or treatments (example: therapies used to treat cancer and rheumatological diseases). However, even if you do not have one on these, it can still happen. If you have any of these conditions, or take one of these drugs, make sure to notify your treating physician. Nerve Damage: This is more common when the treatment is an invasive one, but it can also happen with the use of medications, such as those used in the treatment of cancer. The damage can occur to small secondary nerves, or to large primary ones, such as those in the spinal cord and brain. This damage may be temporary or permanent and it may lead to impairments that can range from temporary numbness to permanent paralysis and/or brain death. Allergic Reactions: Any time a substance or material comes in contact with our body, there is the possibility of an allergic reaction. These can range from a mild skin rash (contact dermatitis) to a severe systemic reaction (anaphylactic reaction), which can result in death. Death: In general, any medical intervention can result in death, most of the time due to an unforeseen complication. ______________________________________________________________________      ______________________________________________________________________    Steroid injections  Common steroids for injections Triamcinolone: Used by many sports medicine physicians for large joint and bursal injections, often combined with a  local anesthetic like lidocaine . A study focusing on coccydynia (tailbone pain) found triamcinolone was more effective than betamethasone, suggesting it may also be preferable for other localized inflammation conditions. Methylprednisolone : A common alternative to triamcinolone that is also a strong anti-inflammatory. It is available in different formulations, with the acetate suspension being the long-acting option for intra-articular injections. Dexamethasone : This is a non-particulate steroid, meaning it has a lower risk of tissue damage compared to particulate steroids like triamcinolone and methylprednisolone . While less common for this specific use, it is an option for targeted injections.   Considerations for physicians Particulate vs. non-particulate steroids: Triamcinolone and methylprednisolone  are particulate, meaning they can clump together. Dexamethasone  is non-particulate. Particulate steroids are often preferred for their longer-lasting effects but carry a theoretical higher risk for certain injections (though this is less of a concern in the costochondral joints). Combined injectate: Corticosteroids are typically mixed with a local anesthetic like lidocaine  to provide both immediate pain relief (from the anesthetic) and longer-term inflammation reduction (from the steroid). Imaging guidance: To ensure accurate placement of the needle and medication, physicians may use ultrasound or fluoroscopic guidance for the injection, especially in complex or refractory cases.   Patient guidance Before undergoing a steroid injection, discuss the options with your physician. They will determine the best steroid, dosage, and procedure for your specific case based on factors like: Severity of your condition History of response to other treatments Your overall health status  Experience and preference of the physician  Last  Updated:  07/31/2024 ______________________________________________________________________      ______________________________________________________________________  Spondylolisthesis  Spondylolisthesis is a condition that occurs when a vertebra in the spine slips out of place, usually in the lower back. Symptoms can vary from mild to severe, and a person may have no symptoms.  Some common symptoms include:  Back pain, especially chronic pain  Pain that radiates down the legs  Pain that worsens with exercise  Tightness in the hamstrings  Neck stiffness  Loss of spine flexibility  Weakness in the legs or trouble walking  Numbness and tingling in the groin and/or buttocks   Some causes of spondylolisthesis include: Birth defects. Sudden injury. Abnormal wear on the cartilage and bones, such as arthritis. Bone disease and fractures. Certain sports activities, such as gymnastics, weightlifting, and football.  A doctor can diagnose spondylolisthesis with a physical exam, X-rays, and possibly a CT scan.    Forward slippage is known as "Anterolisthesis".  Backward slippage is known as "Retrolisthesis".   Pathophysiology of Spondylolisthesis:   Grading Classification of Spondylolisthesis Grade I spondylolisthesis is 1 to 25% slippage, grade II is up to 50% slippage, grade III is up to 75% slippage, and grade IV is 76-100% slippage. If there is more than 100% slippage, it is known as spondyloptosis or grade V spondylolisthesis.       ______________________________________________________________________

## 2024-10-22 NOTE — Telephone Encounter (Signed)
 Elizabeth Martin, patient has to stop Plaquinil 11 days.  I counted it out for her and she can't have procedure until 11-06-24.  Just FYI

## 2024-10-22 NOTE — Progress Notes (Signed)
 Safety precautions to be maintained throughout the outpatient stay will include: orient to surroundings, keep bed in low position, maintain call bell within reach at all times, provide assistance with transfer out of bed and ambulation.

## 2024-10-24 ENCOUNTER — Ambulatory Visit: Admitting: Occupational Therapy

## 2024-10-24 ENCOUNTER — Ambulatory Visit

## 2024-10-24 NOTE — Progress Notes (Unsigned)
 THERAPIST PROGRESS NOTE  Session Time: ***  Participation Level: Active  Behavioral Response: CasualAlertEuthymic  Type of Therapy: Individual therapy  Treatment Goals addressed:  Active     BH CCP Acute or Chronic Trauma Reaction     LTG: Elimination of maladaptive behaviors and thinking patterns which interfere with resolution of trauma as evidenced by self report (Completed/Met)     Start:  11/25/23    Expected End:  04/24/24    Resolved:  07/23/24    Goal Note     07/23/24: Pt reports marked improvement.         LTG: Develop and implement effective coping skills to carry out normal responsibilities and participate constructively in relationships as evidenced by self report (Completed/Met)     Start:  11/25/23    Expected End:  04/24/24    Resolved:  07/23/24    Goal Note     07/23/24: Pt reports marked improvement.         LTG: Recall traumatic events without becoming overwhelmed with negative emotions (Completed/Met)     Start:  11/25/23    Expected End:  04/24/24    Resolved:  07/23/24      LTG: Pt reports think about these things in the past without having such an impact on my life.  (Completed/Met)     Start:  11/25/23    Expected End:  10/23/24    Resolved:  09/19/24    Goal Note     09/19/24: I think that I've met that.          Cooperate with trauma-focused psychotherapy techniques to reduce emotional reaction to the traumatic event      Start:  11/25/23         Educate Elizabeth Martin on common reactions to a traumatic experience     Start:  11/25/23         Assess whether Elizabeth Martin experiences dissociative symptoms (e.g., flashbacks, memory loss, identity disorder), and treat or refer for treatment     Start:  11/25/23         Work with Elizabeth Martin to construct a list of the situations, people, & places that [name] evoke the most distressing symptoms; suggest that they keep a journal of instances of stress being triggered     Start:  11/25/23          Teach Elizabeth Martin coping strategies (e.g., writing down thoughts and feelings in a journal; taking deep, slow breaths; calling a support person to talk about memories) to deal with trauma memories and sudden emotional reactions without becoming emotionally nu     Start:  11/25/23         Encourage Elizabeth Martin to identify 2 trauma related cognitive distortions     Start:  11/25/23           OP Depression     LTG: Reduce frequency, intensity, and duration of depression symptoms so that daily functioning is improved (Completed/Met)     Start:  11/25/23    Expected End:  04/24/24    Resolved:  07/23/24      STG: Elizabeth Martin will identify cognitive patterns and beliefs that support depression (Completed/Met)     Start:  11/25/23    Expected End:  04/24/24    Resolved:  07/23/24      LTG: Pt identifies the goal to Accept chronic pain and learning coping skills  (Progressing)     Start:  11/25/23    Expected End:  10/23/24  Goal Note     Cln will send patient resources on chronic pain and mindfulness.          Work with Elizabeth Martin to identify the major components of a recent episode of depression: physical symptoms, major thoughts and images, and major behaviors they experienced     Start:  11/25/23         Therapist will educate patient on cognitive distortions and the rationale for treatment of depression     Start:  11/25/23         Elizabeth Martin will identify 2 cognitive distortions they are currently using and write reframing statements to replace them     Start:  11/25/23         Coping Skills      Start:  11/25/23       Will work with the pt using CBT/DBT techniques to help the pt verbalize an understanding of the cognitive, physiological, and behavioral components of depression and its treatment. This will be done by using worksheets, interactive activities, CBT/ABC thought logs, modeling, homework, role playing and journaling. Will work with pt to learn and implement  coping skills that result in a reduction of depression and improve daily functioning per pt self report 3 out of 5 documented sessions.         Self Esteem:     Pt states a goal of:    07/23/24: I think I have a little bit of social anxiety but I hide it really well. She believes this involves concerns how other's think about her related to her intelligence.     LTG: Pt reports she wants to feel more confident when she speaks.  (Progressing)     Start:  07/23/24    Expected End:  10/23/24      07/23/24: Pt reports she feels she is still concerned how other people judge her intellegence. Sites limitations due to age and conditions which impact her ability to find her words.     Goal Note     09/19/24: What is happening right now is medications make me have to pause and really think about what I have to say. Reports she often deals with irritability and fatigue which makes her self conscious.          CBT     Start:  07/23/24      Will Work with patient to decrease the frequency of negative self-descriptive statements and increase the frequency of positive self- descriptive statements using CBT/DBT/REBT techniques per patient self-report 3 out of 5 documented sessions. Some of the techniques that will be used will be CBT, positive affirmations, role playing, modeling, homework and journaling.           ProgressTowards Goals: Progressing  Interventions: {CHL AMB BH Type of Intervention:21022753}  Summary: Elizabeth Martin is a 63 y.o. female who presents with symptoms of depression and trauma. Patient identifies symptoms to include reexperiencing, uncontrollable worry, and hypervigilance. Pt was oriented times 5. Pt was cooperative and engaged. Pt denies SI/HI/AVH.   Cln utilized the first half of session to review patients progress. See progress notes documented above. The patient reports***  The clinician readministered the PHQ-9 and GAD-7 assessments. The patient's anxiety  scores ***creased from 3 to ***, and depression scores also ***creased from 0 to ***. The patient shares***  Cln readminstered the PCL5 with patients PTSD scored ***creasing from *** to ***   Suicidal/Homicidal: Nowithout intent/plan  Therapist Response: Clinician used active and supportive reflection to  create a safe environment for patient to process recent life stressors. Clinician assessed for current symptoms, stressors, safety since last session.   Plan: Return again in 4 weeks.  Diagnosis: Recurrent major depressive disorder, in partial remission  PTSD (post-traumatic stress disorder)  GAD (generalized anxiety disorder)   Collaboration of Care: AEB psychiatrist can access notes and cln. Will review psychiatrists' notes. Check in with the patient and will see LCSW per availability. Patient agreed with treatment recommendations.   Patient/Guardian was advised Release of Information must be obtained prior to any record release in order to collaborate their care with an outside provider. Patient/Guardian was advised if they have not already done so to contact the registration department to sign all necessary forms in order for us  to release information regarding their care.   Consent: Patient/Guardian gives verbal consent for treatment and assignment of benefits for services provided during this visit. Patient/Guardian expressed understanding and agreed to proceed.   Elizabeth KATHEE Husband, LCSW 10/24/2024

## 2024-10-25 ENCOUNTER — Ambulatory Visit: Admitting: Licensed Clinical Social Worker

## 2024-10-25 DIAGNOSIS — F3341 Major depressive disorder, recurrent, in partial remission: Secondary | ICD-10-CM

## 2024-10-25 DIAGNOSIS — F3342 Major depressive disorder, recurrent, in full remission: Secondary | ICD-10-CM

## 2024-10-25 DIAGNOSIS — Z8659 Personal history of other mental and behavioral disorders: Secondary | ICD-10-CM

## 2024-10-25 DIAGNOSIS — F411 Generalized anxiety disorder: Secondary | ICD-10-CM

## 2024-10-25 DIAGNOSIS — F4310 Post-traumatic stress disorder, unspecified: Secondary | ICD-10-CM

## 2024-10-25 NOTE — Progress Notes (Addendum)
 THERAPIST PROGRESS NOTE  Session Time: 2-2:58pm  Participation Level: Active  Behavioral Response: CasualAlertEuthymic  Type of Therapy: Individual therapy  Treatment Goals addressed:   Active     OP Depression     LTG: Reduce frequency, intensity, and duration of depression symptoms so that daily functioning is improved (Completed/Met)     Start:  11/25/23    Expected End:  04/24/24    Resolved:  07/23/24      STG: Neeva will identify cognitive patterns and beliefs that support depression (Completed/Met)     Start:  11/25/23    Expected End:  04/24/24    Resolved:  07/23/24      LTG: Pt identifies the goal to Accept chronic pain and learning coping skills  (Progressing)     Start:  11/25/23    Expected End:  01/25/25       Goal Note     10/25/24: I'm getting better and getting better about speaking up to my doctors.          STG: Practice identifying 3 things she is grateful for 5/7 days out of the week for 2 months.  (Initial)     Start:  10/25/24    Expected End:  01/25/25            Work with Erminio to identify the major components of a recent episode of depression: physical symptoms, major thoughts and images, and major behaviors they experienced     Start:  11/25/23         Therapist will educate patient on cognitive distortions and the rationale for treatment of depression     Start:  11/25/23         Elizebeth will identify 2 cognitive distortions they are currently using and write reframing statements to replace them     Start:  11/25/23         Coping Skills      Start:  11/25/23       Will work with the pt using CBT/DBT techniques to help the pt verbalize an understanding of the cognitive, physiological, and behavioral components of depression and its treatment. This will be done by using worksheets, interactive activities, CBT/ABC thought logs, modeling, homework, role playing and journaling. Will work with pt to learn and implement  coping skills that result in a reduction of depression and improve daily functioning per pt self report 3 out of 5 documented sessions.         Self Esteem:     Pt states a goal of:    07/23/24: I think I have a little bit of social anxiety but I hide it really well. She believes this involves concerns how other's think about her related to her intelligence.     LTG: Pt reports she wants to feel more confident when she speaks.  (Not Progressing)     Start:  07/23/24    Expected End:  01/25/25      07/23/24: Pt reports she feels she is still concerned how other people judge her intellegence. Sites limitations due to age and conditions which impact her ability to find her words.     Goal Note     10/25/24: No improvement. Shares she has been engaging in reading on how to approach people and connect with others. Shares she has come to terms with differing levels of connection with friends.          STG: Go to the [facility] 1x/week for 2 months  (  Initial)     Start:  10/25/24    Expected End:  01/25/25            CBT     Start:  07/23/24      Will Work with patient to decrease the frequency of negative self-descriptive statements and increase the frequency of positive self- descriptive statements using CBT/DBT/REBT techniques per patient self-report 3 out of 5 documented sessions. Some of the techniques that will be used will be CBT, positive affirmations, role playing, modeling, homework and journaling.          Progress Towards Goals: Progressing  Interventions: Solution Focused, Strength-based, and Supportive  Summary: Elizabeth Martin is a 63 y.o. female who presents with symptoms of depression and trauma. Patient identifies symptoms to include reexperiencing, uncontrollable worry, and hypervigilance. Pt was oriented times 5. Pt was cooperative and engaged. Pt denies SI/HI/AVH.   Cln utilized the first half of session to review patients progress. See progress notes documented  above.   The clinician readministered the PHQ-9 and GAD-7 assessments. The patient's anxiety scores decreased from 3 to 0, and depression scores also increased from 0 to 3.  Patients PCL scored decreased to 8. The patient shares no improvement. Shares she has been engaging in reading on how to approach people and connect with others. Shares she has come to terms with differing levels of connection with friends. States she still needs to improve confidence with communication but notes I'm getting better and getting better about speaking up to my doctors.  Patient reports she is coming to terms with her physical health and understanding physical limitations as a result of different conditions she is coping through.  Patient identified hope as her providers are beginning to change medications that are effective in treating her chronic pain.  Patient identified the following goals: Go to the [facility] 1x/week for 2 months and Practice identifying 3 things she is grateful for 5/7 days out of the week for 2 months.   Suicidal/Homicidal: Nowithout intent/plan  Therapist Response: Clinician used active and supportive reflection to create a safe environment for patient to process recent life stressors. Clinician assessed for current symptoms, stressors, safety since last session. The clinician readministered the PHQ-9, PCL5, and GAD-7 assessments.  Establish specific smart goals to identify solutions patient can engage in to improve confidence with communication as well as excepting her chronic conditions.  Plan: Return again in 4 weeks.  Diagnosis: MDD (major depressive disorder), recurrent, in full remission  GAD (generalized anxiety disorder)  History of posttraumatic stress disorder (PTSD)    Collaboration of Care: AEB psychiatrist can access notes and cln. Will review psychiatrists' notes. Check in with the patient and will see LCSW per availability. Patient agreed with treatment recommendations.    Patient/Guardian was advised Release of Information must be obtained prior to any record release in order to collaborate their care with an outside provider. Patient/Guardian was advised if they have not already done so to contact the registration department to sign all necessary forms in order for us  to release information regarding their care.   Consent: Patient/Guardian gives verbal consent for treatment and assignment of benefits for services provided during this visit. Patient/Guardian expressed understanding and agreed to proceed.   [name], LCSW 10/24/2024

## 2024-10-29 ENCOUNTER — Ambulatory Visit

## 2024-10-29 ENCOUNTER — Ambulatory Visit: Admitting: Occupational Therapy

## 2024-11-05 ENCOUNTER — Ambulatory Visit

## 2024-11-05 DIAGNOSIS — S42291D Other displaced fracture of upper end of right humerus, subsequent encounter for fracture with routine healing: Secondary | ICD-10-CM

## 2024-11-05 DIAGNOSIS — S42291A Other displaced fracture of upper end of right humerus, initial encounter for closed fracture: Secondary | ICD-10-CM | POA: Diagnosis not present

## 2024-11-05 NOTE — Patient Instructions (Signed)
 [facility] [address], [place] [address] [phone]   Thank you for visiting the office today. We appreciate your trust and allowing us  to help you with your orthopedic needs. We have discussed orthopedic transition of care at today's appointment. If you have any difficulties with referral or follow up, please notify the office.  If you experience life-threatening symptoms or it cannot wait until normal office hours, please go to the nearest Emergency Department for immediate evaluation.

## 2024-11-05 NOTE — Progress Notes (Signed)
 Orthopedic Follow-Up Note  5 weeks s/p right proximal humerus fracture Date of injury: 10/02/2024   SUBJECTIVE:   Elizabeth Martin is a 63 y.o. year old who presents to [facility] for follow-up of nonop management of right proximal humerus fracture.  Patient tripped and fell during vacation in [place] on Tuesday, 10/02/2024.  Patient seen at [facility], [place] in [place], [place].  Patient followed up with [facility] on 10/19/2024, 2-1/2 weeks post fall.  Imaging and documentation reviewed.  Discussed surgical versus nonop management; was believed reasonable at the time to continue with nonop management.  Patient presents today with continued symptom improvement.  States no pain at rest.  Denies new symptoms including numbness or paresthesias.  Patient states she has been compliant with no movement of the shoulder.  Has been gradually reducing use of sling but keeps her arm at her side when out of the sling.  Does not use sling at night to sleep, utilizes propping up pillows for shoulder support/immobilization.  Patient does wear sling when out in public.  Patient denies elbow, wrist, hand weakness or paresthesias.  Using over-the-counter Tylenol  occasionally for pain.  Patient leaves for cruise to the [place] on 11/22/2024, and will be on cruise for 10 days.     Past Medical History:  Diagnosis Date   Anxiety    Asthma    Autoimmune disease    Cancer (HCC)    Endometrial lymph node removal (30)   Corneal abrasion, right 05/24/2022   Depression    GERD (gastroesophageal reflux disease)    H/O blood clots    upper rt groin and behind both knees   History of hiatal hernia    Hypertension    Lymphedema    right leg   Morphea scleroderma    PONV (postoperative nausea and vomiting)    states gets violently ill   Past Surgical History:  Procedure Laterality Date   BASAL CELL CARCINOMA EXCISION Left    CHOLECYSTECTOMY  2008   FLEXIBLE SIGMOIDOSCOPY N/A 05/19/2022   Procedure:  FLEXIBLE SIGMOIDOSCOPY;  Surgeon: Toledo, Ladell POUR, MD;  Location: [facility];  Service: Gastroenterology;  Laterality: N/A;   HERNIA REPAIR  2004   ILEOSTOMY CLOSURE N/A 08/24/2022   Procedure: ILEOSTOMY TAKEDOWN, open loop with Arthea Platt, PA-C to assist;  Surgeon: Desiderio Schanz, MD;  Location: [facility] ORS;  Service: General;  Laterality: N/A;   IVC FILTER INSERTION N/A 11/09/2023   Procedure: IVC FILTER INSERTION;  Surgeon: Marea Selinda RAMAN, MD;  Location: [facility];  Service: Cardiovascular;  Laterality: N/A;   IVC FILTER REMOVAL N/A 01/02/2024   Procedure: IVC FILTER REMOVAL;  Surgeon: Marea Selinda RAMAN, MD;  Location: [facility];  Service: Cardiovascular;  Laterality: N/A;   PLANTAR FASCIECTOMY     ROBOTIC ASSISTED TOTAL HYSTERECTOMY WITH BILATERAL SALPINGO OOPHERECTOMY  02/14/2012   ROTATOR CUFF REPAIR Left 03/24/2022   TONSILLECTOMY     TOTAL HIP ARTHROPLASTY Right 11/14/2023   Procedure: TOTAL HIP ARTHROPLASTY - posterior;  Surgeon: Lorelle Arthea, MD;  Location: [facility] ORS;  Service: Orthopedics;  Laterality: Right;   XI ROBOTIC ASSISTED LOWER ANTERIOR RESECTION N/A 05/24/2022   Procedure: XI ROBOTIC ASSISTED LOWER ANTERIOR RESECTION;  Surgeon: Desiderio Schanz, MD;  Location: [facility] ORS;  Service: General;  Laterality: N/A;    Current Outpatient Medications:    acetaminophen  (TYLENOL ) 500 MG tablet, Take 2 tablets (1,000 mg total) by mouth every 8 (eight) hours., Disp: 30 tablet, Rfl: 0   albuterol  (VENTOLIN  HFA)  108 (90 Base) MCG/ACT inhaler, Inhale 2 puffs into the lungs every 6 (six) hours as needed for shortness of breath or wheezing., Disp: , Rfl:    Alum Hydroxide-Mag Carbonate (GAVISCON PO), Take 4 tablets by mouth daily as needed (upset stomach)., Disp: , Rfl:    amoxicillin (AMOXIL) 500 MG capsule, SMARTSIG:4 Capsule(s) By Mouth Once, Disp: , Rfl:    buPROPion  (WELLBUTRIN  XL) 150 MG 24 hr tablet, TAKE 1 TABLET (150 MG TOTAL) BY MOUTH IN THE MORNING, Disp: 90 tablet,  Rfl: 0   celecoxib  (CELEBREX ) 200 MG capsule, Take 200 mg by mouth 2 (two) times daily., Disp: , Rfl:    cetirizine (ZYRTEC) 10 MG tablet, Take 10 mg by mouth daily., Disp: , Rfl:    CLOBETASOL PROPIONATE E 0.05 % emollient cream, Apply topically., Disp: , Rfl:    desonide (DESOWEN) 0.05 % cream, Apply 1 Application topically 2 (two) times daily as needed (morphea flare)., Disp: , Rfl:    famotidine -calcium carbonate-magnesium  hydroxide (PEPCID  COMPLETE) 10-800-165 MG chewable tablet, Chew 1 tablet by mouth daily as needed (heartburn)., Disp: , Rfl:    folic acid (FOLVITE) 1 MG tablet, Take 1 mg by mouth daily., Disp: , Rfl:    gabapentin  (NEURONTIN ) 300 MG capsule, Take 600 mg by mouth 3 (three) times daily., Disp: , Rfl:    hydrocortisone-pramoxine (ANALPRAM-HC) 2.5-1 % rectal cream, Place 1 Application rectally 3 (three) times daily as needed for hemorrhoids., Disp: , Rfl:    hydroxychloroquine  (PLAQUENIL ) 200 MG tablet, Take 200 mg by mouth 2 (two) times daily., Disp: , Rfl:    lisinopril  (ZESTRIL ) 40 MG tablet, Take 40 mg by mouth daily., Disp: , Rfl:    methotrexate (RHEUMATREX) 2.5 MG tablet, Take 10 mg by mouth 2 (two) times a week. Take 10 mg on Monday and Tuesday, Disp: , Rfl:    omeprazole (PRILOSEC) 40 MG capsule, Take 40 mg by mouth daily., Disp: , Rfl:    ondansetron  (ZOFRAN -ODT) 4 MG disintegrating tablet, Take 4 mg by mouth every 8 (eight) hours as needed for nausea or vomiting., Disp: , Rfl:    ondansetron  (ZOFRAN -ODT) 8 MG disintegrating tablet, Take 8 mg by mouth every 8 (eight) hours as needed., Disp: , Rfl:    polyethylene glycol (MIRALAX  / GLYCOLAX ) 17 g packet, Take 17 g by mouth., Disp: , Rfl:    promethazine  (PHENERGAN ) 12.5 MG tablet, Take 12.5 mg by mouth every 8 (eight) hours as needed., Disp: , Rfl:    scopolamine  (TRANSDERM-SCOP) 1 MG/3DAYS, Place 1 mg onto the skin., Disp: , Rfl:    silver sulfADIAZINE (SILVADENE) 1 % cream, Apply 1 Application topically daily., Disp:  , Rfl:    traZODone  (DESYREL ) 50 MG tablet, TAKE 1 TABLET BY MOUTH AT BEDTIME AS NEEDED FOR SLEEP., Disp: 90 tablet, Rfl: 0   triamcinolone (KENALOG) 0.025 % cream, Apply 1 Application topically 2 (two) times daily as needed (morphea flare)., Disp: , Rfl:    trospium  (SANCTURA ) 20 MG tablet, Take 1 tablet (20 mg total) by mouth 2 (two) times daily as needed (overactive bladder)., Disp: 180 tablet, Rfl: 1   venlafaxine  XR (EFFEXOR -XR) 75 MG 24 hr capsule, Take 1 capsule (75 mg total) by mouth daily with breakfast., Disp: 90 capsule, Rfl: 3 Allergies  Allergen Reactions   Carboplatin Anaphylaxis        Hydromorphone  Anaphylaxis    Stopped breathing  Respiratory arrest - due to dosage administered per the patient.   Latex Hives, Itching and Rash  Nitrofurantoin Nausea And Vomiting   Silicone Itching and Rash   Codeine Nausea And Vomiting   Other Nausea And Vomiting    Any codone medications Violently sick even with zofran  per patient report    Wheat Other (See Comments)    Body aches and swelling    Oxycodone  Nausea And Vomiting   Social History   Socioeconomic History   Marital status: Married    Spouse name: Not on file   Number of children: Not on file   Years of education: Not on file   Highest education level: Not on file  Occupational History   Not on file  Tobacco Use   Smoking status: Never   Smokeless tobacco: Never  Vaping Use   Vaping status: Never Used  Substance and Sexual Activity   Alcohol use: Yes   Drug use: Never   Sexual activity: Not Currently  Other Topics Concern   Not on file  Social History Narrative   ** Merged History Encounter **       Social Drivers of Health   Financial Resource Strain: Medium Risk (08/13/2024)   Received [facility]   Overall Financial Resource Strain (CARDIA)    How hard is it for you to pay for the very basics like food, housing, medical care, and heating?: Somewhat hard  Food Insecurity: No Food  Insecurity (08/13/2024)   Received from [facility]   Hunger Vital Sign    Within the past 12 months, you worried that your food would run out before you got the money to buy more.: Never true    Within the past 12 months, the food you bought just didn't last and you didn't have money to get more.: Never true  Transportation Needs: No Transportation Needs (08/13/2024)   Received from [facility]   PRAPARE - Transportation    Lack of Transportation (Medical): No    Lack of Transportation (Non-Medical): No  Physical Activity: Not on file  Stress: Not on file  Social Connections: Unknown (02/01/2023)   [facility] from [facility]   Social Network    Social Network: Not on file  Intimate Partner Violence: Not At Risk (11/14/2023)   Humiliation, Afraid, Rape, and Kick questionnaire    Fear of Current or Ex-Partner: No    Emotionally Abused: No    Physically Abused: No    Sexually Abused: No   Family History  Problem Relation Age of Onset   Mental illness Neg Hx      ROS: A review of systems was performed and is negative unless stated above in HPI    OBJECTIVE:    Constitutional:   The patient is alert and oriented x 3, appears to be stated age and in no distress.   Orthopaedic Examination:   Shoulder Examination (focused):  Right upper extremity: Compartments soft and compressible about the proximal arm Inspection of the shoulder reveals no erythema, ecchymosis, or gross deformity. Mild tenderness with palpation over humeral head. 2+ radial pulse, digits are warm and well perfused Sensation intact to light touch over the radial, ulnar, median, axillary nerve distributions No pain with active ROM at elbow, wrist, and hand/fingers.     Imaging:  XR Shoulder Imaging: X-rays of the Right shoulder including AP/grashey/scapular Y views obtained today 11/05/2024 at [facility] were reviewed personally by me and with patient.  Per my  independent interpretation these images show redemonstrated proximal humerus fracture.  There is a mildly  displaced greater tuberosity fragment with minimal to no movement in comparison to prior outside imaging.  Also noted is oblique fracture through the medial metaphyseal region with slight step-off of the medial cortex of the proximal humerus.  This fracture component is also stable in comparison to prior outside imaging.  Early bridging callus formation noted through the fracture sites.  No new fractures appreciated on today's films.      ASSESSMENT:  5 weeks status post nonoperative right proximal humerus fracture     PLAN:   Patient was seen in office today, 5 weeks from injury regarding follow-up of right proximal humerus fracture.  She was last seen in office roughly 2 weeks ago.  Overall, patient is happy to report that she feels much better.  Reports significant reduction in pain; using no additional medications other than her chronic baseline pain management medications.  Patient's imaging/right shoulder x-ray performed in office today reveals stable fracture alignment in comparison to prior outside imaging.  Minimal to no shift in fracture fragments with early bridging callus formation.  Patient on physical exam with mild tenderness over fracture site but good range of motion and strength at elbow wrist and hand.  No associated paresthesias.  We revisited the treatment plan with patient today.  Provided a long and detailed discussion of proximal humerus fractures and treatment options given patient preferences, fracture characteristics and risks and benefits of all feasible options.  Given the new imaging at today's encounter, the patient and myself believe it is reasonable to continue with nonoperative management.  I did explain to the patient that there is still a small risk of fracture shifting but the likelihood is much lower at this point.  I also reminded patient that we will not know  for a while how much function she will regain.  Patient has gradually started reducing use of sling.  Advised patient to continue using sling when out in public or in a higher fall risk scenario.  However, she may gradually wean out of the sling over the next 4 weeks.  She may also begin gentle pendulum exercises at home which were demonstrated in office today.  We will plan on having patient start formal physical therapy in the next 1-2 weeks.  We reviewed the nonoperative physical therapy protocol and this has been provided to the patient and her desired physical therapist location.  Referral has been placed for physical therapy.  We will also plan on seeing patient back in 4 weeks for repeat radiographs.  Advised patient to follow-up sooner if any new/worsening symptoms or concerns.   All questions and concerns were answered to the best of my ability.   Follow-up: 4 weeks with repeat radiographs including AP, Grashey, scapular Y right shoulder  I discussed with the patient today that I will be transitioning out of my role within the near future. In order to provide appropriate continuity of care, we offered the patient options for follow up regarding their orthopedic concerns. Patient has chosen to maintain care with [facility].  Patient may reach out to our office if there are any difficulties in scheduling follow up care. The patient understands who to contact for future orthopedic concerns and has contact information for the receiving practice.    Arlyss Schneider, DO Orthopedic Surgery & Sports Medicine [facility]

## 2024-11-08 ENCOUNTER — Ambulatory Visit: Admitting: Psychiatry

## 2024-11-08 ENCOUNTER — Other Ambulatory Visit: Payer: Self-pay

## 2024-11-08 ENCOUNTER — Encounter: Payer: Self-pay | Admitting: Psychiatry

## 2024-11-08 VITALS — BP 125/73 | HR 80 | Temp 97.1°F | Ht 67.0 in | Wt 193.4 lb

## 2024-11-08 DIAGNOSIS — F4310 Post-traumatic stress disorder, unspecified: Secondary | ICD-10-CM

## 2024-11-08 DIAGNOSIS — F411 Generalized anxiety disorder: Secondary | ICD-10-CM

## 2024-11-08 DIAGNOSIS — F3342 Major depressive disorder, recurrent, in full remission: Secondary | ICD-10-CM | POA: Diagnosis not present

## 2024-11-08 MED ORDER — BUPROPION HCL ER (XL) 150 MG PO TB24: 150.0000 mg | ORAL_TABLET | Freq: Every morning | ORAL | 3 refills | Status: AC

## 2024-11-08 NOTE — Progress Notes (Signed)
 [facility] MD OP Progress Note  11/08/2024 9:30 AM Elizabeth Martin  MRN:  [id number]  Chief Complaint:  Chief Complaint  Patient presents with   Follow-up   Anxiety   Depression   Medication Refill   Discussed the use of AI scribe software for clinical note transcription with the patient, who gave verbal consent to proceed.  History of Present Illness Elizabeth Martin is a 63 year old Caucasian female, retired, lives in [place], married, has a history of GAD, MDD, PTSD, mixed connective tissue, ANA positive, long-term use of Humira suppressant medication, history of endometrial cancer, chronic pain status post hip surgery was evaluated in office today for a follow-up appointment.  She reports no significant changes in mood since her last visit and describes feeling generally well. After she sustained an arm injury while on vacation, she experienced a brief period of feeling down, which she considers expected given the circumstances. She denies current depression and states she continues to enjoy activities she is able to do.  Ongoing anxiety related to social situations, particularly monthly group gatherings with friends, continues to affect her. She expresses excessive worry about being judged for the food she brings to group gatherings. These anxious feelings occur occasionally, and she notes improved coping with these triggers through EMDR therapy, which she reports is going well.  She denies any thoughts of harming herself or others.  She averages 5 to 6 hours of sleep per night, with occasional awakenings due to arm discomfort or to use the restroom, but she feels rested upon waking. She describes a persistent lack of appetite, which she attributes to her autoimmune disease and related medications, and she notes she lost significant weight during her illness. She works to maintain a regular eating schedule and reports success in doing so.  She reports the appetite loss was ongoing even  before she started the Wellbutrin  and she is not interested in dosage reduction of Wellbutrin  at this time.  Her current medications include Wellbutrin  (bupropion ), venlafaxine , and trazodone , with no recent changes to her regimen.   She is planning to take another medication soon during the holiday season, she is going on a cruise.    Visit Diagnosis:    ICD-10-CM   1. MDD (major depressive disorder), recurrent, in full remission  F33.42 buPROPion  (WELLBUTRIN  XL) 150 MG 24 hr tablet    2. PTSD (post-traumatic stress disorder)  F43.10     3. GAD (generalized anxiety disorder)  F41.1       Past Psychiatric History: I have reviewed past psychiatric history from progress note on 11/10/2023.  Past trials of Cymbalta .  Past Medical History:  Past Medical History:  Diagnosis Date   Anxiety    Asthma    Autoimmune disease    Cancer (HCC)    Endometrial lymph node removal (30)   Corneal abrasion, right 05/24/2022   Depression    GERD (gastroesophageal reflux disease)    H/O blood clots    upper rt groin and behind both knees   History of hiatal hernia    Hypertension    Lymphedema    right leg   Morphea scleroderma    PONV (postoperative nausea and vomiting)    states gets violently ill    Past Surgical History:  Procedure Laterality Date   BASAL CELL CARCINOMA EXCISION Left    CHOLECYSTECTOMY  2008   FLEXIBLE SIGMOIDOSCOPY N/A 05/19/2022   Procedure: FLEXIBLE SIGMOIDOSCOPY;  Surgeon: Toledo, Ladell POUR, MD;  Location: [facility];  Service:  Gastroenterology;  Laterality: N/A;   HERNIA REPAIR  2004   ILEOSTOMY CLOSURE N/A 08/24/2022   Procedure: ILEOSTOMY TAKEDOWN, open loop with Arthea Platt, PA-C to assist;  Surgeon: Desiderio Schanz, MD;  Location: [facility];  Service: General;  Laterality: N/A;   IVC FILTER INSERTION N/A 11/09/2023   Procedure: IVC FILTER INSERTION;  Surgeon: Marea Selinda RAMAN, MD;  Location: [facility];  Service: Cardiovascular;  Laterality: N/A;    IVC FILTER REMOVAL N/A 01/02/2024   Procedure: IVC FILTER REMOVAL;  Surgeon: Marea Selinda RAMAN, MD;  Location: [facility];  Service: Cardiovascular;  Laterality: N/A;   PLANTAR FASCIECTOMY     ROBOTIC ASSISTED TOTAL HYSTERECTOMY WITH BILATERAL SALPINGO OOPHERECTOMY  02/14/2012   ROTATOR CUFF REPAIR Left 03/24/2022   TONSILLECTOMY     TOTAL HIP ARTHROPLASTY Right 11/14/2023   Procedure: TOTAL HIP ARTHROPLASTY - posterior;  Surgeon: Lorelle Arthea, MD;  Location: [facility];  Service: Orthopedics;  Laterality: Right;   XI ROBOTIC ASSISTED LOWER ANTERIOR RESECTION N/A 05/24/2022   Procedure: XI ROBOTIC ASSISTED LOWER ANTERIOR RESECTION;  Surgeon: Desiderio Schanz, MD;  Location: [facility];  Service: General;  Laterality: N/A;    Family Psychiatric History: I have reviewed family psychiatric history from progress note on 11/10/2023.  Family History:  Family History  Problem Relation Age of Onset   Mental illness Neg Hx     Social History: I have reviewed social history from progress note on 11/10/2023. Social History   Socioeconomic History   Marital status: Married    Spouse name: Not on file   Number of children: 2   Years of education: Not on file   Highest education level: Bachelor's degree (e.g., BA, AB, BS)  Occupational History   Not on file  Tobacco Use   Smoking status: Never   Smokeless tobacco: Never  Vaping Use   Vaping status: Never Used  Substance and Sexual Activity   Alcohol use: Yes   Drug use: Never   Sexual activity: Not Currently  Other Topics Concern   Not on file  Social History Narrative   ** Merged History Encounter **       Social Drivers of Health   Financial Resource Strain: Medium Risk (08/13/2024)   Received from [facility]   Overall Financial Resource Strain (CARDIA)    How hard is it for you to pay for the very basics like food, housing, medical care, and heating?: Somewhat hard  Food Insecurity: No Food Insecurity (08/13/2024)   Received  from [facility]   Hunger Vital Sign    Within the past 12 months, you worried that your food would run out before you got the money to buy more.: Never true    Within the past 12 months, the food you bought just didn't last and you didn't have money to get more.: Never true  Transportation Needs: No Transportation Needs (08/13/2024)   Received from [facility]   PRAPARE - Transportation    Lack of Transportation (Medical): No    Lack of Transportation (Non-Medical): No  Physical Activity: Not on file  Stress: Not on file  Social Connections: Unknown (02/01/2023)   Received from [facility]   Social Network    Social Network: Not on file    Allergies:  Allergies  Allergen Reactions   Carboplatin Anaphylaxis        Hydromorphone  Anaphylaxis    Stopped breathing  Respiratory arrest - due to dosage administered per the  patient.   Latex Hives, Itching and Rash   Nitrofurantoin Nausea And Vomiting   Silicone Itching and Rash   Codeine Nausea And Vomiting   Other Nausea And Vomiting    Any codone medications Violently sick even with zofran  per patient report    Wheat Other (See Comments)    Body aches and swelling    Oxycodone  Nausea And Vomiting    Metabolic Disorder Labs: No results found for: HGBA1C, MPG No results found for: PROLACTIN No results found for: CHOL, TRIG, HDL, CHOLHDL, VLDL, LDLCALC Lab Results  Component Value Date   TSH 2.066 12/21/2023    Therapeutic Level Labs: No results found for: LITHIUM No results found for: VALPROATE No results found for: CBMZ  Current Medications: Current Outpatient Medications  Medication Sig Dispense Refill   acetaminophen  (TYLENOL ) 500 MG tablet Take 2 tablets (1,000 mg total) by mouth every 8 (eight) hours. 30 tablet 0   albuterol  (VENTOLIN  HFA) 108 (90 Base) MCG/ACT inhaler Inhale 2 puffs into the lungs every 6 (six) hours as needed for shortness of breath or wheezing.     Alum  Hydroxide-Mag Carbonate (GAVISCON PO) Take 4 tablets by mouth daily as needed (upset stomach).     amoxicillin (AMOXIL) 500 MG capsule SMARTSIG:4 Capsule(s) By Mouth Once     buPROPion  (WELLBUTRIN  XL) 150 MG 24 hr tablet Take 1 tablet (150 mg total) by mouth in the morning. 90 tablet 3   celecoxib  (CELEBREX ) 200 MG capsule Take 200 mg by mouth 2 (two) times daily.     cetirizine (ZYRTEC) 10 MG tablet Take 10 mg by mouth daily.     CLOBETASOL PROPIONATE E 0.05 % emollient cream Apply topically.     desonide (DESOWEN) 0.05 % cream Apply 1 Application topically 2 (two) times daily as needed (morphea flare).     famotidine -calcium carbonate-magnesium  hydroxide (PEPCID  COMPLETE) 10-800-165 MG chewable tablet Chew 1 tablet by mouth daily as needed (heartburn).     folic acid (FOLVITE) 1 MG tablet Take 1 mg by mouth daily.     gabapentin  (NEURONTIN ) 300 MG capsule Take 600 mg by mouth 3 (three) times daily.     hydrocortisone-pramoxine (ANALPRAM-HC) 2.5-1 % rectal cream Place 1 Application rectally 3 (three) times daily as needed for hemorrhoids.     hydroxychloroquine  (PLAQUENIL ) 200 MG tablet Take 200 mg by mouth 2 (two) times daily.     lisinopril  (ZESTRIL ) 40 MG tablet Take 40 mg by mouth daily.     methotrexate (RHEUMATREX) 2.5 MG tablet Take 10 mg by mouth 2 (two) times a week. Take 10 mg on Monday and Tuesday     omeprazole (PRILOSEC) 40 MG capsule Take 40 mg by mouth daily.     ondansetron  (ZOFRAN -ODT) 4 MG disintegrating tablet Take 4 mg by mouth every 8 (eight) hours as needed for nausea or vomiting.     ondansetron  (ZOFRAN -ODT) 8 MG disintegrating tablet Take 8 mg by mouth every 8 (eight) hours as needed.     polyethylene glycol (MIRALAX  / GLYCOLAX ) 17 g packet Take 17 g by mouth.     promethazine  (PHENERGAN ) 12.5 MG tablet Take 12.5 mg by mouth every 8 (eight) hours as needed.     scopolamine  (TRANSDERM-SCOP) 1 MG/3DAYS Place 1 mg onto the skin.     silver sulfADIAZINE (SILVADENE) 1 % cream  Apply 1 Application topically daily.     traZODone  (DESYREL ) 50 MG tablet TAKE 1 TABLET BY MOUTH AT BEDTIME AS NEEDED FOR SLEEP. 90 tablet 0  triamcinolone (KENALOG) 0.025 % cream Apply 1 Application topically 2 (two) times daily as needed (morphea flare).     trospium  (SANCTURA ) 20 MG tablet Take 1 tablet (20 mg total) by mouth 2 (two) times daily as needed (overactive bladder). 180 tablet 1   venlafaxine  XR (EFFEXOR -XR) 75 MG 24 hr capsule Take 1 capsule (75 mg total) by mouth daily with breakfast. 90 capsule 3   No current facility-administered medications for this visit.     Musculoskeletal: Strength & Muscle Tone: within normal limits except for right arm in a sling Gait & Station: normal Patient leans: N/A  Psychiatric Specialty Exam: Review of Systems  Psychiatric/Behavioral:  Positive for sleep disturbance. The patient is nervous/anxious.     Blood pressure 125/73, pulse 80, temperature (!) 97.1 F (36.2 C), temperature source Temporal, height 5' 7 (1.702 m), weight 193 lb 6.4 oz (87.7 kg).Body mass index is 30.29 kg/m.  General Appearance: Casual  Eye Contact:  Fair  Speech:  Clear and Coherent  Volume:  Normal  Mood:  Anxious  Affect:  Appropriate  Thought Process:  Goal Directed and Descriptions of Associations: Intact  Orientation:  Full (Time, Place, and Person)  Thought Content: Logical   Suicidal Thoughts:  No  Homicidal Thoughts:  No  Memory:  Immediate;   Fair Recent;   Fair Remote;   Fair  Judgement:  Fair  Insight:  Fair  Psychomotor Activity:  Normal  Concentration:  Concentration: Fair and Attention Span: Fair  Recall:  [employer] of Knowledge: Fair  Language: Fair  Akathisia:  No  Handed:  Right  AIMS (if indicated): not done  Assets:  Communication Skills Desire for Improvement Housing Social Support Transportation  ADL's:  Intact  Cognition: WNL  Sleep:  varies due to pain, recent fracture   Screenings: GAD-7    Flowsheet Row Office  Visit from 11/08/2024 in [facility] Counselor from 10/25/2024 in [facility] Counselor from 07/23/2024 in [facility] Office Visit from 04/04/2024 in [facility] Counselor from 03/20/2024 in [facility]  Total GAD-7 Score 2 0 3 7 3    PHQ2-9    Flowsheet Row Office Visit from 11/08/2024 in [facility] Counselor from 10/25/2024 in [facility] Office Visit from 10/22/2024 in [facility] Counselor from 07/23/2024 in [facility] Office Visit from 04/04/2024 in [facility]  PHQ-2 Total Score 1 0 0 0 1  PHQ-9 Total Score -- 3 -- -- 10   Flowsheet Row Office Visit from 11/08/2024 in [facility] Counselor from 10/25/2024 in [facility] Video Visit from 07/27/2024 in [facility]  C-SSRS RISK CATEGORY No Risk No Risk No Risk     Assessment and Plan: Averee Harb is a 63 year old Caucasian female who presented for follow-up appointment, discussed assessment and plan as noted below.  1. MDD (major depressive disorder), recurrent, in full remission Currently denies any significant depression symptoms Continue EMDR therapy with Ms. Evalene Husband Continue Wellbutrin  150 mg daily Continue Venlafaxine  extended release 75 mg daily  2. PTSD (post-traumatic stress disorder)-improving Currently reports trauma related symptoms is improving Continue EMDR therapy Continue Trazodone  50 mg at bedtime Continue Venlafaxine  as prescribed  3. GAD (generalized  anxiety  disorder)-improving Currently reports anxiety is more manageable. Continue psychotherapy sessions Continue Venlafaxine  extended release 75 mg daily  Follow-up Follow-up in clinic in 3 months or sooner if needed.     Collaboration of Care: Collaboration of Care: Referral or follow-up with counselor/therapist AEB encouraged to continue psychotherapy sessions.  Patient/Guardian was advised Release of Information must be obtained prior to any record release in order to collaborate their care with an outside provider. Patient/Guardian was advised if they have not already done so to contact the registration department to sign all necessary forms in order for us  to release information regarding their care.   Consent: Patient/Guardian gives verbal consent for treatment and assignment of benefits for services provided during this visit. Patient/Guardian expressed understanding and agreed to proceed.  This note was generated in part or whole with voice recognition software. Voice recognition is usually quite accurate but there are transcription errors that can and very often do occur. I apologize for any typographical errors that were not detected and corrected.  This note was generated in part or whole with voice recognition software. Voice recognition is usually quite accurate but there are transcription errors that can and very often do occur. I apologize for any typographical errors that were not detected and corrected.      Tevis Conger, MD 11/08/2024, 9:30 AM

## 2024-11-12 DIAGNOSIS — M5459 Other low back pain: Secondary | ICD-10-CM | POA: Insufficient documentation

## 2024-11-12 DIAGNOSIS — R892 Abnormal level of other drugs, medicaments and biological substances in specimens from other organs, systems and tissues: Secondary | ICD-10-CM | POA: Insufficient documentation

## 2024-11-12 DIAGNOSIS — M47816 Spondylosis without myelopathy or radiculopathy, lumbar region: Secondary | ICD-10-CM | POA: Insufficient documentation

## 2024-11-12 DIAGNOSIS — M545 Low back pain, unspecified: Secondary | ICD-10-CM | POA: Insufficient documentation

## 2024-11-12 DIAGNOSIS — Z91040 Latex allergy status: Secondary | ICD-10-CM | POA: Insufficient documentation

## 2024-11-12 NOTE — Progress Notes (Unsigned)
 PROVIDER NOTE: Interpretation of information contained herein should be left to medically-trained personnel. Specific patient instructions are provided elsewhere under Patient Instructions section of medical record. This document was created in part using STT-dictation technology, any transcriptional errors that may result from this process are unintentional.  Patient: Elizabeth Martin Type: Established DOB: Mar 10, 1961 MRN: [id number] PCP: Tobie Border, MD  Service: Procedure DOS: 11/13/2024 Setting: Ambulatory Location: Ambulatory outpatient facility Delivery: Face-to-face Provider: Eric DELENA Como, MD Specialty: Interventional Pain Management Specialty designation: 09 Location: Outpatient facility Ref. Prov.: Como Eric, MD       Interventional Therapy   Type: Lumbar Facet, Medial Branch Block(s) (w/ fluoroscopic mapping) #1  Laterality: Bilateral  Level: L3, L4, L5, and S1 Medial Branch/Dorsal Rami Level(s). Injecting these levels blocks the L4-5 and L5-S1 lumbar facet joints. Imaging: Fluoroscopic guidance Spinal ([id number]) Anesthesia: Local anesthesia (1-2% Lidocaine ) Anxiolysis: IV Versed  2.0 mg            Sedation: Minimal Sedation Fentanyl  1 mL (50 mcg) DOS: 11/13/2024 Performed by: Eric DELENA Como, MD  Primary Purpose: Diagnostic/Therapeutic Indications: Low back pain severe enough to impact quality of life or function. 1. Chronic low back pain (1ry area of Pain) (Bilateral) (R>L) w/o sciatica   2. Lumbar Anterolisthesis of (L4/L5) (8 mm on flexion) (Dynamic)   3. Lumbar pars defect (Right) (L4) (postsurgical)   4. Low back pain of over 3 months duration   5. Multifactorial low back pain   6. Lumbar facet joint pain   7. Lumbar facet hypertrophy   8. Lumbar facet joint syndrome   9. Abnormal MRI, lumbar spine (01/10/2023) [facility])    Latex precautions, history of latex allergy    Chronic anticoagulation (Plaquenil )    NAS-11 Pain score:    Pre-procedure: 8 /10   Post-procedure: 0-No pain/10     Position / Prep / Materials:  Position: Prone  Prep solution: ChloraPrep (2% chlorhexidine  gluconate and 70% isopropyl alcohol) Area Prepped: Posterolateral Lumbosacral Spine (Wide prep: From the lower border of the scapula down to the end of the tailbone and from flank to flank.)  Materials:  Tray: Block Needle(s):  Type: Spinal  Gauge (G): 22  Length: 3.5-in Qty: 4     H&P (Pre-op Assessment):  Elizabeth Martin is a 63 y.o. (year old), female patient, seen today for interventional treatment. She  has a past surgical history that includes Tonsillectomy; Cholecystectomy (2008); Hernia repair (2004); Plantar fasciectomy; Excision basal cell carcinoma (Left); Flexible sigmoidoscopy (N/A, 05/19/2022); Robotic assisted total hysterectomy with bilateral salpingo oophorectomy (02/14/2012); Rotator cuff repair (Left, 03/24/2022); XI robotic assisted lower anterior resection (N/A, 05/24/2022); Ileostomy closure (N/A, 08/24/2022); IVC FILTER INSERTION (N/A, 11/09/2023); Total hip arthroplasty (Right, 11/14/2023); and IVC FILTER REMOVAL (N/A, 01/02/2024). Ms. Luff has a current medication list which includes the following prescription(s): acetaminophen , albuterol , alum hydroxide-mag carbonate, budesonide-formoterol, bupropion , celecoxib , cetirizine, clobetasol propionate e, desonide, famotidine -calcium carbonate-magnesium  hydroxide, folic acid, gabapentin , hydrocortisone-pramoxine, hydroxychloroquine , lisinopril , methotrexate, omeprazole, ondansetron , ondansetron , polyethylene glycol, promethazine , scopolamine , silver sulfadiazine, trazodone , triamcinolone , trospium , and venlafaxine  xr, and the following Facility-Administered Medications: fentanyl . Her primarily concern today is the Back Pain  Initial Vital Signs:  Pulse/HCG Rate: 85ECG Heart Rate: 79 Temp: (!) 97.3 F (36.3 C) Resp: 16 BP: 132/71 SpO2: 99 %  BMI: Estimated body mass index is  30.23 kg/m as calculated from the following:   Height as of this encounter: 5' 7 (1.702 m).   Weight as of this encounter: 193 lb (87.5 kg).  Risk Assessment: Allergies: Reviewed. She  is allergic to carboplatin, hydromorphone , latex, nitrofurantoin, silicone, codeine, other, wheat, and oxycodone .  Allergy Precautions: None required Coagulopathies: Reviewed. None identified.  Blood-thinner therapy: None at this time Active Infection(s): Reviewed. None identified. Elizabeth Martin is afebrile  Site Confirmation: Elizabeth Martin was asked to confirm the procedure and laterality before marking the site Procedure checklist: Completed Consent: Before the procedure and under the influence of no sedative(s), amnesic(s), or anxiolytics, the patient was informed of the treatment options, risks and possible complications. To fulfill our ethical and legal obligations, as recommended by the American Medical Association's Code of Ethics, I have informed the patient of my clinical impression; the nature and purpose of the treatment or procedure; the risks, benefits, and possible complications of the intervention; the alternatives, including doing nothing; the risk(s) and benefit(s) of the alternative treatment(s) or procedure(s); and the risk(s) and benefit(s) of doing nothing. The patient was provided information about the general risks and possible complications associated with the procedure. These may include, but are not limited to: failure to achieve desired goals, infection, bleeding, organ or nerve damage, allergic reactions, paralysis, and death. In addition, the patient was informed of those risks and complications associated to Spine-related procedures, such as failure to decrease pain; infection (i.e.: Meningitis, epidural or intraspinal abscess); bleeding (i.e.: epidural hematoma, subarachnoid hemorrhage, or any other type of intraspinal or peri-dural bleeding); organ or nerve damage (i.e.: Any type of  peripheral nerve, nerve root, or spinal cord injury) with subsequent damage to sensory, motor, and/or autonomic systems, resulting in permanent pain, numbness, and/or weakness of one or several areas of the body; allergic reactions; (i.e.: anaphylactic reaction); and/or death. Furthermore, the patient was informed of those risks and complications associated with the medications. These include, but are not limited to: allergic reactions (i.e.: anaphylactic or anaphylactoid reaction(s)); adrenal axis suppression; blood sugar elevation that in diabetics may result in ketoacidosis or comma; water  retention that in patients with history of congestive heart failure may result in shortness of breath, pulmonary edema, and decompensation with resultant heart failure; weight gain; swelling or edema; medication-induced neural toxicity; particulate matter embolism and blood vessel occlusion with resultant organ, and/or nervous system infarction; and/or aseptic necrosis of one or more joints. Finally, the patient was informed that Medicine is not an exact science; therefore, there is also the possibility of unforeseen or unpredictable risks and/or possible complications that may result in a catastrophic outcome. The patient indicated having understood very clearly. We have given the patient no guarantees and we have made no promises. Enough time was given to the patient to ask questions, all of which were answered to the patient's satisfaction. Ms. Brougham has indicated that she wanted to continue with the procedure. Attestation: I, the ordering provider, attest that I have discussed with the patient the benefits, risks, side-effects, alternatives, likelihood of achieving goals, and potential problems during recovery for the procedure that I have provided informed consent. Date  Time: 11/13/2024  7:58 AM  Pre-Procedure Preparation:  Monitoring: As per clinic protocol. Respiration, ETCO2, SpO2, BP, heart rate and rhythm  monitor placed and checked for adequate function Safety Precautions: Patient was assessed for positional comfort and pressure points before starting the procedure. Time-out: I initiated and conducted the Time-out before starting the procedure, as per protocol. The patient was asked to participate by confirming the accuracy of the Time Out information. Verification of the correct person, site, and procedure were performed and confirmed by me, the nursing staff, and the patient. Time-out conducted as per  Joint Commission's Universal Protocol (UP.01.01.01). Time: 0845 Start Time: 0846 hrs.  Description of Procedure:          Laterality: (see above) Targeted Levels: (see above)  Safety Precautions: Aspiration looking for blood return was conducted prior to all injections. At no point did we inject any substances, as a needle was being advanced. Before injecting, the patient was told to immediately notify me if she was experiencing any new onset of ringing in the ears, or metallic taste in the mouth. No attempts were made at seeking any paresthesias. Safe injection practices and needle disposal techniques used. Medications properly checked for expiration dates. SDV (single dose vial) medications used. After the completion of the procedure, all disposable equipment used was discarded in the proper designated medical waste containers. Local Anesthesia: Protocol guidelines were followed. The patient was positioned over the fluoroscopy table. The area was prepped in the usual manner. The time-out was completed. The target area was identified using fluoroscopy. A 12-in long, straight, sterile hemostat was used with fluoroscopic guidance to locate the targets for each level blocked. Once located, the skin was marked with an approved surgical skin marker. Once all sites were marked, the skin (epidermis, dermis, and hypodermis), as well as deeper tissues (fat, connective tissue and muscle) were infiltrated  with a small amount of a short-acting local anesthetic, loaded on a 10cc syringe with a 25G, 1.5-in  Needle. An appropriate amount of time was allowed for local anesthetics to take effect before proceeding to the next step. Local Anesthetic: Lidocaine  2.0% The unused portion of the local anesthetic was discarded in the proper designated containers. Technical description of process:  Medial Branch  Dorsal Rami Nerve Block (MBB):  Neuroanatomy note: Each lumbar facet joint receives dual innervation from medial branches arising from the posterior primary rami at the same level and one level above. The target for each lumbar medial branch is the junction of the ipsilateral superior articular and transverse process of the lower vertebral body. (i.e.: The L4-L5 facet joint is innervated by the L4 medial branch [located at L5] and the L3 medial branch [located at L4]. Blocking the L4 Medial Branch is therefore achieved by injecting at the junction of the ipsilateral superior articular and transverse process of the lower vertebral body [L5].).  Exception: The exception to the above rule is the L5-S1 facet joint which has triple innervation requiring the L4 medial branch, as well as the L5 and the S1 Dorsal Rami(s) to be blocked to fully denervate the joint.  Under fluoroscopic guidance, a needle was inserted until contact was made with os over the target area. After negative aspiration, 0.5 mL of the nerve block solution was injected without difficulty or complication. Paresthesia were avoided during injection. The needle(s) were removed intact and without complication.  Once the entire procedure was completed, the treated area was cleaned, making sure to leave some of the prepping solution back to take advantage of its long term bactericidal properties.         Illustration of the posterior view of the lumbar spine and the posterior neural structures. Laminae of L2 through S1 are labeled. DPRL5, dorsal  primary ramus of L5; DPRS1, dorsal primary ramus of S1; DPR3, dorsal primary ramus of L3; FJ, facet (zygapophyseal) joint L3-L4; I, inferior articular process of L4; LB1, lateral branch of dorsal primary ramus of L1; IAB, inferior articular branches from L3 medial branch (supplies L4-L5 facet joint); IBP, intermediate branch plexus; MB3, medial branch of dorsal primary  ramus of L3; NR3, third lumbar nerve root; S, superior articular process of L5; SAB, superior articular branches from L4 (supplies L4-5 facet joint also); TP3, transverse process of L3.   Facet Joint Innervation (* possible contribution)  L1-2 T12, L1 (L2*)  Medial Branch  L2-3 L1, L2 (L3*)                     L3-4 L2, L3 (L4*)                     L4-5 L3, L4 (L5*)                     L5-S1 L4, L5, S1                        Vitals:   11/13/24 0856 11/13/24 0900 11/13/24 0910 11/13/24 0922  BP: (!) 141/99 (!) 104/94 119/73 125/82  Pulse:      Resp: 10 13 15 15   Temp:  (!) 97.1 F (36.2 C)  (!) 97.1 F (36.2 C)  SpO2: 98% 95% 96% 98%  Weight:      Height:         End Time: 0855 hrs.  Imaging Guidance (Spinal):         Type of Imaging Technique: Fluoroscopy Guidance (Spinal) Indication(s): Fluoroscopy guidance for needle placement to enhance accuracy in procedures requiring precise needle localization for targeted delivery of medication in or near specific anatomical locations not easily accessible without such real-time imaging assistance. Exposure Time: Please see nurses notes. Contrast: None used. Fluoroscopic Guidance: I was personally present during the use of fluoroscopy. Tunnel Vision Technique used to obtain the best possible view of the target area. Parallax error corrected before commencing the procedure. Direction-depth-direction technique used to introduce the needle under continuous pulsed fluoroscopy. Once target was reached, antero-posterior, oblique, and lateral fluoroscopic projection used  confirm needle placement in all planes. Images permanently stored in EMR. Interpretation: No contrast injected. I personally interpreted the imaging intraoperatively. Adequate needle placement confirmed in multiple planes. Permanent images saved into the patient's record.  Post-operative Assessment:  Post-procedure Vital Signs:  Pulse/HCG Rate: 8590 Temp: (!) 97.1 F (36.2 C) Resp: 15 BP: 125/82 SpO2: 98 %  EBL: None  Complications: No immediate post-treatment complications observed by team, or reported by patient.  Note: The patient tolerated the entire procedure well. A repeat set of vitals were taken after the procedure and the patient was kept under observation following institutional policy, for this type of procedure. Post-procedural neurological assessment was performed, showing return to baseline, prior to discharge. The patient was provided with post-procedure discharge instructions, including a section on how to identify potential problems. Should any problems arise concerning this procedure, the patient was given instructions to immediately contact us , at any time, without hesitation. In any case, we plan to contact the patient by telephone for a follow-up status report regarding this interventional procedure.  Comments:  No additional relevant information.  Plan of Care (POC)  Orders:  Orders Placed This Encounter  Procedures   LUMBAR FACET(MEDIAL BRANCH NERVE BLOCK) MBNB    Scheduling Instructions:     Procedure: Lumbar facet block (AKA.: Lumbosacral medial branch nerve block)     Side: Bilateral     Level: L3-4, L4-5, and L5-S1 Facets (L2, L3, L4, L5, and S1 Medial Branch Nerves)     Sedation: Patient's choice.     Date: 11/13/2024  Where will this procedure be performed?:   [facility]   DG PAIN [facility] C-ARM 1-60 MIN NO REPORT    Intraoperative interpretation by procedural physician at [facility].    Standing Status:   Standing    Number of  Occurrences:   1    Reason for exam::   Assistance in needle guidance and placement for procedures requiring needle placement in or near specific anatomical locations not easily accessible without such assistance.   Informed Consent Details: Physician/Practitioner Attestation; Transcribe to consent form and obtain patient signature    Nursing Order: Transcribe to consent form and obtain patient signature. Note: Always confirm laterality of pain with Ms. Kivi, before procedure.    Physician/Practitioner attestation of informed consent for procedure/surgical case:   I, the physician/practitioner, attest that I have discussed with the patient the benefits, risks, side effects, alternatives, likelihood of achieving goals and potential problems during recovery for the procedure that I have provided informed consent.    Procedure:   Lumbar Facet Block  under fluoroscopic guidance    Physician/Practitioner performing the procedure:   Analya Louissaint A. Tanya MD    Indication/Reason:   Low Back Pain, with our without leg pain, due to Facet Joint Arthralgia (Joint Pain) Spondylosis (Arthritis of the Spine), without myelopathy or radiculopathy (Nerve Damage).   Care order/instruction: Please confirm that the patient has stopped the Plaquenil  (Hydroxychloroquine ) x 11 days prior to procedure or surgery.    Please confirm that the patient has stopped the Plaquenil  (Hydroxychloroquine ) x 11 days prior to procedure or surgery.    Standing Status:   Standing    Number of Occurrences:   1   Provide equipment / supplies at bedside    Procedure tray: Block Tray (Disposable  single use) Skin infiltration needle: Regular 1.5-in, 25-G, (x1) Block Needle type: Spinal Amount/quantity: 4 Size: Medium (5-inch) Gauge: 22G    Standing Status:   Standing    Number of Occurrences:   1    Specify:   Block Tray   Saline lock IV    Have LR [phone] mL available and administer at 125 mL/hr if patient becomes hypotensive.     Standing Status:   Standing    Number of Occurrences:   1   Bleeding precautions    Standing Status:   Standing    Number of Occurrences:   1   Latex precautions    Activate Latex-Free Protocol.    Standing Status:   Standing    Number of Occurrences:   1     Opioid Analgesic: None MME/day: 0 mg/day    Medications ordered for procedure: Meds ordered this encounter  Medications   lidocaine  (XYLOCAINE ) 2 % (with pres) injection 400 mg   pentafluoroprop-tetrafluoroeth (GEBAUERS) aerosol   midazolam  (VERSED ) 5 MG/5ML injection 0.5-2 mg    Make sure Flumazenil is available in the pyxis when using this medication. If oversedation occurs, administer 0.2 mg IV over 15 sec. If after 45 sec no response, administer 0.2 mg again over 1 min; may repeat at 1 min intervals; not to exceed 4 doses (1 mg)   fentaNYL  (SUBLIMAZE ) injection 25-50 mcg    Make sure Narcan is available in the pyxis when using this medication. In the event of respiratory depression (RR< 8/min): Titrate NARCAN (naloxone) in increments of 0.1 to 0.2 mg IV at 2-3 minute intervals, until desired degree of reversal.   ropivacaine  (PF) 2 mg/mL (0.2%) (NAROPIN ) injection 18 mL   triamcinolone   acetonide (KENALOG -40) injection 80 mg   Medications administered: We administered lidocaine , pentafluoroprop-tetrafluoroeth, midazolam , fentaNYL , ropivacaine  (PF) 2 mg/mL (0.2%), and triamcinolone  acetonide.  See the medical record for exact dosing, route, and time of administration.    Interventional Therapies  Risk Factors  Considerations  Medical Comorbidities:  Plaquenil  Anticoagulation: (Stop: 11 days)  (09/05/2024) UDS (+) carboxy-THC  ALLERGY: Latex, hydromorphone , codeine, oxycodone , silicone  Hx. Cancer (Skin, Uterus, Endometrium)  GERD  HTN  BA  Hashimoto's Disease  PTSD  GAD  Raynaud's Disease  Hx. Bowel Obstruction w/ partial colectomy  MCTD (mixed connective tissue disease)  Polyarthralgia  Hx. DVT      Planned  Pending:   Diagnostic bilateral lumbar facet MBB #1    Under consideration:   Diagnostic bilateral lumbar facet MBB #1    Completed: (Analgesic benefit)1  None at this time   Therapeutic  Palliative (PRN) options:   None established   Completed by other providers:   Diagnostic LE EMG/PNCV (01/25/2023) by Luetta Sport, MD [facility]) Dx: Chronic right L4 radiculopathy without active denervation. (Pre-operative) Surgery: Right L4-5 microdiscectomy (04/11/2023) by Lugene Ozell Faden, MD [facility]) Therapeutic left hip injection x1 (08/30/2023) by Prentice Reges, DO ([facility])  Therapeutic right hip injection x1 (08/12/2023) by Prentice Reges, DO ([facility])  Diagnostic/therapeutic right L4 & L5 transforaminal ESI x1 (02/21/2023) by Franky Ambrosia, DO [facility])   1(Analgesic benefit): Expressed in percentage (%). (Local anesthetic[LA] +/- sedation  L.A.Local Anesthetic  Steroid benefit  Ongoing benefit)      Follow-up plan:   Return in about 2 weeks (around 11/27/2024) for (Face2F), (PPE).     Recent Visits Date Type Provider Dept  10/22/24 Office Visit Tanya Glisson, MD [facility]  09/05/24 Office Visit Tanya Glisson, MD [facility]  Showing recent visits within past 90 days and meeting all other requirements Today's Visits Date Type Provider Dept  11/13/24 Procedure visit Tanya Glisson, MD [facility]  Showing today's visits and meeting all other requirements Future Appointments Date Type Provider Dept  12/10/24 Appointment Tanya Glisson, MD [facility]  Showing future appointments within next 90 days and meeting all other requirements   Disposition: Discharge home  Discharge (Date  Time): 11/13/2024;   hrs.   Primary Care Physician: Tobie Border, MD Location: [facility] Note by: Glisson DELENA Tanya, MD (TTS technology used. I apologize for any  typographical errors that were not detected and corrected.) Date: 11/13/2024; Time: 9:25 AM  Disclaimer:  Medicine is not an visual merchandiser. The only guarantee in medicine is that nothing is guaranteed. It is important to note that the decision to proceed with this intervention was based on the information collected from the patient. The Data and conclusions were drawn from the patient's questionnaire, the interview, and the physical examination. Because the information was provided in large part by the patient, it cannot be guaranteed that it has not been purposely or unconsciously manipulated. Every effort has been made to obtain as much relevant data as possible for this evaluation. It is important to note that the conclusions that lead to this procedure are derived in large part from the available data. Always take into account that the treatment will also be dependent on availability of resources and existing treatment guidelines, considered by other Pain Management Practitioners as being common knowledge and practice, at the time of the intervention. For Medico-Legal purposes, it is also important to point out that variation in procedural techniques and pharmacological  choices are the acceptable norm. The indications, contraindications, technique, and results of the above procedure should only be interpreted and judged by a Board-Certified Interventional Pain Specialist with extensive familiarity and expertise in the same exact procedure and technique.

## 2024-11-13 ENCOUNTER — Ambulatory Visit: Admitting: Pain Medicine

## 2024-11-13 ENCOUNTER — Encounter: Payer: Self-pay | Admitting: Pain Medicine

## 2024-11-13 ENCOUNTER — Telehealth: Payer: Self-pay

## 2024-11-13 ENCOUNTER — Ambulatory Visit
Admission: RE | Admit: 2024-11-13 | Discharge: 2024-11-13 | Disposition: A | Source: Ambulatory Visit | Attending: Pain Medicine | Admitting: Pain Medicine

## 2024-11-13 VITALS — BP 125/82 | HR 85 | Temp 97.1°F | Resp 15 | Ht 67.0 in | Wt 193.0 lb

## 2024-11-13 DIAGNOSIS — M961 Postlaminectomy syndrome, not elsewhere classified: Secondary | ICD-10-CM

## 2024-11-13 DIAGNOSIS — G8929 Other chronic pain: Secondary | ICD-10-CM

## 2024-11-13 DIAGNOSIS — I890 Lymphedema, not elsewhere classified: Secondary | ICD-10-CM | POA: Insufficient documentation

## 2024-11-13 DIAGNOSIS — R94130 Abnormal response to nerve stimulation, unspecified: Secondary | ICD-10-CM

## 2024-11-13 DIAGNOSIS — M5459 Other low back pain: Secondary | ICD-10-CM

## 2024-11-13 DIAGNOSIS — M545 Low back pain, unspecified: Secondary | ICD-10-CM

## 2024-11-13 DIAGNOSIS — M4306 Spondylolysis, lumbar region: Secondary | ICD-10-CM

## 2024-11-13 DIAGNOSIS — M47816 Spondylosis without myelopathy or radiculopathy, lumbar region: Secondary | ICD-10-CM | POA: Diagnosis present

## 2024-11-13 DIAGNOSIS — R600 Localized edema: Secondary | ICD-10-CM

## 2024-11-13 DIAGNOSIS — M4316 Spondylolisthesis, lumbar region: Secondary | ICD-10-CM

## 2024-11-13 DIAGNOSIS — Z7901 Long term (current) use of anticoagulants: Secondary | ICD-10-CM

## 2024-11-13 DIAGNOSIS — Z91040 Latex allergy status: Secondary | ICD-10-CM

## 2024-11-13 DIAGNOSIS — R937 Abnormal findings on diagnostic imaging of other parts of musculoskeletal system: Secondary | ICD-10-CM

## 2024-11-13 MED ORDER — ROPIVACAINE HCL 2 MG/ML IJ SOLN
INTRAMUSCULAR | Status: AC
  Filled 2024-11-13: qty 20

## 2024-11-13 MED ORDER — TRIAMCINOLONE ACETONIDE 40 MG/ML IJ SUSP
80.0000 mg | Freq: Once | INTRAMUSCULAR | Status: AC
  Administered 2024-11-13: 80 mg

## 2024-11-13 MED ORDER — LIDOCAINE HCL 2 % IJ SOLN
INTRAMUSCULAR | Status: AC
  Filled 2024-11-13: qty 20

## 2024-11-13 MED ORDER — LIDOCAINE HCL 2 % IJ SOLN
20.0000 mL | Freq: Once | INTRAMUSCULAR | Status: AC
  Administered 2024-11-13: 400 mg

## 2024-11-13 MED ORDER — MIDAZOLAM HCL 5 MG/5ML IJ SOLN
0.5000 mg | Freq: Once | INTRAMUSCULAR | Status: AC
  Administered 2024-11-13: 2 mg via INTRAVENOUS

## 2024-11-13 MED ORDER — FENTANYL CITRATE (PF) 100 MCG/2ML IJ SOLN
INTRAMUSCULAR | Status: AC
  Filled 2024-11-13: qty 2

## 2024-11-13 MED ORDER — PENTAFLUOROPROP-TETRAFLUOROETH EX AERO
INHALATION_SPRAY | Freq: Once | CUTANEOUS | Status: AC
  Administered 2024-11-13: 30 via TOPICAL

## 2024-11-13 MED ORDER — TRIAMCINOLONE ACETONIDE 40 MG/ML IJ SUSP
INTRAMUSCULAR | Status: AC
  Filled 2024-11-13: qty 2

## 2024-11-13 MED ORDER — ROPIVACAINE HCL 2 MG/ML IJ SOLN
18.0000 mL | Freq: Once | INTRAMUSCULAR | Status: AC
  Administered 2024-11-13: 18 mL via PERINEURAL

## 2024-11-13 MED ORDER — MIDAZOLAM HCL 5 MG/5ML IJ SOLN
INTRAMUSCULAR | Status: AC
  Filled 2024-11-13: qty 5

## 2024-11-13 MED ORDER — FENTANYL CITRATE (PF) 100 MCG/2ML IJ SOLN
25.0000 ug | INTRAMUSCULAR | Status: DC | PRN
  Administered 2024-11-13: 50 ug via INTRAVENOUS

## 2024-11-13 NOTE — Patient Instructions (Signed)
 ______________________________________________________________________    Post-Procedure Discharge Instructions  INSTRUCTIONS Apply ice:  Purpose: This will minimize any swelling and discomfort after procedure.  When: Day of procedure, as soon as you get home. How: Fill a plastic sandwich bag with crushed ice. Cover it with a small towel and apply to injection site. How long: (15 min on, 15 min off) Apply for 15 minutes then remove x 15 minutes.  Repeat sequence on day of procedure, until you go to bed. Apply heat:  Purpose: To treat any soreness and discomfort from the procedure. When: Starting the next day after the procedure. How: Apply heat to procedure site starting the day following the procedure. How long: May continue to repeat daily, until discomfort goes away. Food intake: Start with clear liquids (like water) and advance to regular food, as tolerated.  Physical activities: Keep activities to a minimum for the first 8 hours after the procedure. After that, then as tolerated. Driving: If you have received any sedation, be responsible and do not drive. You are not allowed to drive for 24 hours after having sedation. Blood thinner: (Applies only to those taking blood thinners) You may restart your blood thinner 6 hours after your procedure. Insulin: (Applies only to Diabetic patients taking insulin) As soon as you can eat, you may resume your normal dosing schedule. Infection prevention: Keep procedure site clean and dry. Shower daily and clean area with soap and water.  PAIN DIARY Post-procedure Pain Diary: Extremely important that this be done correctly and accurately. Recorded information will be used to determine the next step in treatment. For the purpose of accuracy, follow these rules: Evaluate only the area treated. Do not report or include pain from an untreated area. For the purpose of this evaluation, ignore all other areas of pain, except for the treated area. After your  procedure, avoid taking a long nap and attempting to complete the pain diary after you wake up. Instead, set your alarm clock to go off every hour, on the hour, for the initial 8 hours after the procedure. Document the duration of the numbing medicine, and the relief you are getting from it. Do not go to sleep and attempt to complete it later. It will not be accurate. If you received sedation, it is likely that you were given a medication that may cause amnesia. Because of this, completing the diary at a later time may cause the information to be inaccurate. This information is needed to plan your care. Follow-up appointment: Keep your post-procedure follow-up evaluation appointment after the procedure (usually 2 weeks for most procedures, 6 weeks for radiofrequencies). DO NOT FORGET to bring you pain diary with you.   EXPECT... (What should I expect to see with my procedure?) From numbing medicine (AKA: Local Anesthetics): Numbness or decrease in pain. You may also experience some weakness, which if present, could last for the duration of the local anesthetic. Onset: Full effect within 15 minutes of injected. Duration: It will depend on the type of local anesthetic used. On the average, 1 to 8 hours.  From steroids (Applies only if steroids were used): Decrease in swelling or inflammation. Once inflammation is improved, relief of the pain will follow. Onset of benefits: Depends on the amount of swelling present. The more swelling, the longer it will take for the benefits to be seen. In some cases, up to 10 days. Duration: Steroids will stay in the system x 2 weeks. Duration of benefits will depend on multiple posibilities including persistent irritating  factors. Side-effects: If present, they may typically last 2 weeks (the duration of the steroids). Frequent: Cramps (if they occur, drink Gatorade and take over-the-counter Magnesium 450-500 mg once to twice a day); water retention with temporary weight  gain; increases in blood sugar; decreased immune system response; increased appetite. Occasional: Facial flushing (red, warm cheeks); mood swings; menstrual changes. Uncommon: Long-term decrease or suppression of natural hormones; bone thinning. (These are more common with higher doses or more frequent use. This is why we prefer that our patients avoid having any injection therapies in other practices.)  Very Rare: Severe mood changes; psychosis; aseptic necrosis. From procedure: Some discomfort is to be expected once the numbing medicine wears off. This should be minimal if ice and heat are applied as instructed.  CALL IF... (When should I call?) You experience numbness and weakness that gets worse with time, as opposed to wearing off. New onset bowel or bladder incontinence. (Applies only to procedures done in the spine)  Emergency Numbers: Durning business hours (Monday - Thursday, 8:00 AM - 4:00 PM) (Friday, 9:00 AM - 12:00 Noon): [phone] After hours: [phone] NOTE: If you are having a problem and are unable connect with, or to talk to a provider, then go to your nearest urgent care or emergency department. If the problem is serious and urgent, please call 911. ______________________________________________________________________     ______________________________________________________________________    Steroid injections  Common steroids for injections Triamcinolone : Used by many sports medicine physicians for large joint and bursal injections, often combined with a local anesthetic like lidocaine . A study focusing on coccydynia (tailbone pain) found triamcinolone  was more effective than betamethasone, suggesting it may also be preferable for other localized inflammation conditions. Methylprednisolone: A common alternative to triamcinolone  that is also a strong anti-inflammatory. It is available in different formulations, with the acetate suspension being the long-acting  option for intra-articular injections. Dexamethasone : This is a non-particulate steroid, meaning it has a lower risk of tissue damage compared to particulate steroids like triamcinolone  and methylprednisolone. While less common for this specific use, it is an option for targeted injections.   Considerations for physicians Particulate vs. non-particulate steroids: Triamcinolone  and methylprednisolone are particulate, meaning they can clump together. Dexamethasone  is non-particulate. Particulate steroids are often preferred for their longer-lasting effects but carry a theoretical higher risk for certain injections (though this is less of a concern in the costochondral joints). Combined injectate: Corticosteroids are typically mixed with a local anesthetic like lidocaine  to provide both immediate pain relief (from the anesthetic) and longer-term inflammation reduction (from the steroid). Imaging guidance: To ensure accurate placement of the needle and medication, physicians may use ultrasound or fluoroscopic guidance for the injection, especially in complex or refractory cases.   Patient guidance Before undergoing a steroid injection, discuss the options with your physician. They will determine the best steroid, dosage, and procedure for your specific case based on factors like: Severity of your condition History of response to other treatments Your overall health status Experience and preference of the physician  Last  Updated: 07/31/2024 ______________________________________________________________________

## 2024-11-13 NOTE — Progress Notes (Signed)
 Safety precautions to be maintained throughout the outpatient stay will include: orient to surroundings, keep bed in low position, maintain call bell within reach at all times, provide assistance with transfer out of bed and ambulation.

## 2024-11-13 NOTE — Telephone Encounter (Signed)
 Lvm for pt to cb to see if she wants to come in tomorrow to see Dr Gust

## 2024-11-14 ENCOUNTER — Telehealth: Payer: Self-pay | Admitting: *Deleted

## 2024-11-14 ENCOUNTER — Telehealth: Payer: Self-pay

## 2024-11-14 NOTE — Telephone Encounter (Signed)
 Post procedure call; voicemail left

## 2024-11-14 NOTE — Telephone Encounter (Signed)
 Patient says she is doing ok. fyi

## 2024-11-15 ENCOUNTER — Ambulatory Visit: Admitting: Physician Assistant

## 2024-11-15 ENCOUNTER — Encounter: Payer: Self-pay | Admitting: Pain Medicine

## 2024-12-10 ENCOUNTER — Ambulatory Visit: Admitting: Pain Medicine

## 2024-12-13 ENCOUNTER — Ambulatory Visit: Admitting: Licensed Clinical Social Worker

## 2024-12-17 ENCOUNTER — Ambulatory Visit

## 2024-12-18 NOTE — Progress Notes (Signed)
 HPI:  Elizabeth Martin is a 64 y.o. female who presents for follow-up a year and a half status post right posterior total hip replacement.  Patient reports she has done well with the hip and has no pain with motion of the hip.  She does have some buttock pain with extended sitting which she is about a 4 out of 10.  She reports this has become more constant but does not cause any issues with walking or with motion of the hip. reports it got worse after she had a small fall onto her right buttock the patient denies fevers, chills, numbness, tingling, shortness of breath, chest pain, recent illness, or any trauma.  Current Outpatient Medications  Medication Sig Dispense Refill   acetaminophen  (TYLENOL  ORAL) Take by mouth     albuterol  90 mcg/actuation inhaler Inhale 2 inhalations into the lungs every 6 (six) hours as needed     alendronate (FOSAMAX) 70 MG tablet Take 1 tablet (70 mg total) by mouth every 7 (seven) days Take on an empty stomach with a full glass of water . Avoid mineral or well water . Do not eat or take other medications for at least 30 minutes after dose. Sit or stand for at least 30 minutes after dose. 4 tablet 11   buPROPion  (WELLBUTRIN  XL) 150 MG XL tablet Take 150 mg by mouth     busPIRone  (BUSPAR ) 5 MG tablet Take 5 mg by mouth 2 (two) times daily     celecoxib  (CELEBREX ) 200 MG capsule TAKE 1 CAPSULE BY MOUTH TWICE A DAY 60 capsule 1   cetirizine (ZYRTEC) 10 MG tablet Take 1 tablet (10 mg total) by mouth once daily as needed     folic acid (FOLVITE) 1 MG tablet Take 1 tablet (1 mg total) by mouth once daily 90 tablet 3   gabapentin  (NEURONTIN ) 300 MG capsule Take 2 capsules (600 mg total) by mouth 4 (four) times daily 180 capsule 2   hydrocortisone-pramoxine (ANALPRAM-HC) 2.5-1 % rectal cream PLACE RECTALLY 3 (THREE) TIMES DAILY AS NEEDED FOR HEMORRHOIDS 30 g 0   hydroxychloroquine  (PLAQUENIL ) 200 mg tablet TAKE 1 TABLET (200 MG TOTAL) BY MOUTH 2 (TWO) TIMES DAILY NO  REFILLS 60 tablet 5   lisinopril  (PRINIVIL ,ZESTRIL ) 20 MG tablet Take 1 tablet by mouth once daily     methotrexate (RHEUMATREX) 2.5 MG tablet Take 5 tablets (12.5 mg total) by mouth every 7 (seven) days 60 tablet 1   multivitamin capsule Take 1 capsule by mouth once daily     omeprazole (PRILOSEC) 40 MG DR capsule Take 40 mg by mouth once daily     ondansetron  (ZOFRAN ) 4 MG tablet Take 4 mg by mouth every 8 (eight) hours as needed for Nausea     ondansetron  (ZOFRAN -ODT) 8 MG disintegrating tablet Take 1 tablet (8 mg total) by mouth every 8 (eight) hours as needed for Nausea 60 tablet 5   polyethylene glycol (MIRALAX ) packet Take 17 g by mouth once daily Mix in 4-8ounces of fluid prior to taking.     promethazine  (PHENERGAN ) 12.5 MG tablet Take 12.5 mg by mouth     scopolamine  (TRANSDERM-SCOP) 1 mg over 3 days patch Place 1 mg onto the skin     silver sulfADIAZINE (SSD) 1 % cream Apply 1 Application topically     SUMAtriptan (IMITREX) 100 MG tablet Take 100 mg by mouth as directed for Migraine May take a second dose after 2 hours if needed.     traZODone  (DESYREL ) 50 MG tablet  Take 1 tablet by mouth at bedtime as needed     UNABLE TO FIND as needed Med Name: picot powder for nausea     venlafaxine  (EFFEXOR -XR) 37.5 MG XR capsule Take 37.5 mg by mouth 2 tablets     No current facility-administered medications for this visit.   Allergies  Allergen Reactions   Carboplatin Anaphylaxis    ANAPHYLACTIC REACTION    Carboplatin Anaphylaxis    ANAPHYLACTIC REACTION ANAPHYLACTIC REACTION   Dilaudid  [Hydromorphone ] Anaphylaxis    Stopped breathing   Adhesive Tape-Silicones Itching and Rash   Nitrofurantoin Nausea And Vomiting and Nausea   Nitrofurantoin Monohyd/M-Cryst Nausea And Vomiting   Silicone Itching and Rash   Macrobid [Nitrofurantoin Monohyd/M-Cryst] Nausea   Wheat Bran Other (See Comments)    Other reaction(s): Other (See Comments) Body aches and  swelling Body aches and swelling   Fentanyl  Rash   Fentanyl  Rash, Hives and Itching   Past Medical History:  Diagnosis Date   Adhesive capsulitis of left shoulder    Asthma, unspecified asthma severity, unspecified whether complicated, unspecified whether persistent (HHS-HCC)    Endometrial cancer (CMS/HHS-HCC) 04/05/2018   STAGE IIB May 2013 IMO Update   Essential hypertension 08/09/2017   Last Assessment & Plan:  Formatting of this note might be different from the original. Today's BP: 142/93 borderline controlled - Continue lisinopril  20mg  daily and lasix 40mg  daily for lymphedema - Consider increasing lisinopril  to 40 mg daily at follow-up.  BP Readings from Last 3 Encounters:  11/16/17 142/93  08/09/17 132/85  07/13/17 137/80   Gastroenteritis 12/07/2017   Last Assessment & Plan:  Can start azithromtycin 1000mg  x1 d/t severity of symptoms. Recommend BRAT diet and pushing fluids. Discussed and provided patient info and return precautions. Will get stool studies if no improvement by beginning of next week.   Hashimoto's disease    History of abnormal cervical Pap smear 2/20 /2013   Endometrial cancer   History of cancer    Lymphedema 08/09/2017   Last Assessment & Plan:  2/2 R pelvic/inguinal LN dissection for endometrial cancer in 2013. Increase lasix 40mg  daily to BID qod for the next week. In the past, patient has had concerns about current dose losing its effectiveness. Continue compression stockings and routine elevation as well as weight loss (see tab). Provided home care instructions and return precautions.   Morbid obesity (CMS-HCC) 08/11/2017   Last Assessment & Plan:  Formatting of this note might be different from the original. Discussed importance of diet, exercise, and weight loss in mitigating adverse health-related outcomes. Recommend the following: - Calorie counting and restriction. Pt reports 1200-1300/day on myfitnesspal. Asked her to be more diligent in  counting. - Daily weight checks and weight loss goal of 0.5-1lb/week. - Ex   Osteoarthritis    Seborrheic keratosis 08/11/2017   Last Assessment & Plan:  Extensive lesions on R medial ankle and desiring removal due to irritation with shaving. Recommend routine soaks and pumice stone. Will consider trial of cryo and/or derm referral on f/u visit. Provided home care instructions and return precautions.   Venous thromboembolism 2013   Clot after surgery   Past Surgical History:  Procedure Laterality Date   OTHER SURGERY  2023   intestinal surgery   COLONOSCOPY  05/19/2022   Fecal ImpactionNo Repeat/TKT   ileostomy removal  08/2022   Colon @ [facility]  08/05/2023   Normal examined colon/Repeat 52yrs/TKT   EGD @ [facility]  08/05/2023   Gastritis/No repeat/TKT   ARTHROPLASTY HIP TOTAL  Right 11/14/2023   Posterior, By Dr. Lorelle   ARTHROPLASTY HIP TOTAL Right 11/14/2023   By Dr. Lorelle   basil cell removal     Facial cells   CHOLECYSTECTOMY     FASCIECTOMY PLANTAR FASCIA Left    HEMORRHOIDECTOMY EXTERNAL     HERNIA REPAIR     umbilical   HYSTERECTOMY     OOPHORECTOMY  2013   TONSILLECTOMY     TUBAL LIGATION  1986    Family History  Problem Relation Name Age of Onset   Arthritis Mother Beryl Officer    High blood pressure (Hypertension) Mother Beryl Officer    Skin cancer Mother Beryl Officer    Arthritis Father Zell Officer    High blood pressure (Hypertension) Father Zell Officer    Scleroderma Daughter      Social History   Socioeconomic History   Marital status: Married  Tobacco Use   Smoking status: Never   Smokeless tobacco: Never  Vaping Use   Vaping status: Never Used  Substance and Sexual Activity   Alcohol use: Yes    Alcohol/week: 2.0 - 4.0 standard drinks of alcohol    Types: 2 - 4 Glasses of wine per week    Comment: socially   Drug use: Never   Sexual activity: Yes    Partners: Male    Birth control/protection: Post-menopausal  Social  History Narrative   ** Merged History Encounter **       Social Drivers of Health   Financial Resource Strain: Low Risk  (12/18/2024)   Overall Financial Resource Strain (CARDIA)    Difficulty of Paying Living Expenses: Not hard at all  Food Insecurity: No Food Insecurity (12/18/2024)   Hunger Vital Sign    Worried About Running Out of Food in the Last Year: Never true    Ran Out of Food in the Last Year: Never true  Transportation Needs: No Transportation Needs (12/18/2024)   PRAPARE - Administrator, Civil Service (Medical): No    Lack of Transportation (Non-Medical): No    Review of Systems:  A comprehensive 14 point ROS was performed, reviewed, and the pertinent orthopaedic findings are documented in the HPI.  Exam: Vitals:   12/18/24 1139  BP: 120/64  Weight: 86.5 kg (190 lb 9.6 oz)  Height: 170.2 cm (5' 7)  PainSc:   4  PainLoc: Hip   General/Constitutional: The patient appears to be well-nourished, well-developed, and in no acute distress. Neuro/Psych: Normal mood and affect, oriented to person, place and time.  Right lower Extremity Exam  The right hip is noted to have a well-healed surgical incision on the lateral thigh with no evidence of erythema or drainage, chronic skin changes noted.  No evidence for infection at this time.   No swelling or fluctuance noted around the hip.  The right leg is chronically swollen compared to the left unchanged from prior exams. The patient is ambulating with no assistive devices.   Range of motion of the hip is smooth and pain-free.   Neurovascularly intact distally, able to dorsiflex with good sensation over the foot.   Negative Homans' sign no calf tenderness to palpation.     X-rays/MRI/Lab data:  2 view x-rays AP, lateral of the right hip and pelvis ordered and taken today in clinic and images reviewed by myself show status post right total hip arthroplasty with components in appropriate position.   No  evidence of periprosthetic loosening or fracture.  Assessment: Encounter Diagnosis  Name Primary?   Status post total replacement of right hip Yes    Plan: The patient is doing well status post their right total hip replacement.  She has developed some pain which is reproducible over her right ischium.  We discussed contusions and the nature of tendinitis.  We can refer to my partner for ultrasound-guided trigger point injection over this area.  We did discuss that she will need a driver for transportation on the day of the injection.  Otherwise we will have her follow-up in a year for repeat evaluation and 2-year follow-up status post her right total hip replacement.  All questions answered and she agrees above plan will follow-up in 1 year.

## 2024-12-19 NOTE — Progress Notes (Signed)
 Rheumatology Follow Up Note  Chief Complaint  Patient presents with   MCTD (mixed connective tissue disease) (CMS/HHS-HCC)      Subjective:HPI  Elizabeth Martin is a 64 y.o. female is here today for follow up of positive ANA, granuloma annulare, morphea. The patient's allergies, current medications, past family history, past medical history, past social history, past surgical history and problem list were reviewed and updated as appropriate.   She is taking the Plaquenil  and Methotrexate. She is tolerating this well. She is seeing dermatology at [facility]. She does get nasal sores. She has no new morphea lesions. She does get episodes of raynaud's. The comes and goes worse in cooler weather. She has no swelling of the hand joints. She does get some soreness and aching pains of the joints. She has no muscle weakness, dry cough or dyspnea.   She is taking gabapentin .   Review of Systems:   Review of Systems  Constitutional:  Positive for fatigue.       Weight Gain 5-10 lbs  HENT:  Negative for mouth sores and trouble swallowing.        Neg: Dry Mouth  Eyes:  Positive for visual disturbance. Negative for redness.       Neg: Dry Eyes  Respiratory:  Negative for cough, shortness of breath and wheezing.   Cardiovascular:  Positive for leg swelling. Negative for chest pain.  Gastrointestinal:  Positive for nausea. Negative for constipation and diarrhea.       Heartburn  Endocrine: Negative for cold intolerance and heat intolerance.  Genitourinary:  Negative for hematuria.  Musculoskeletal:        Per HPI  Skin:  Positive for rash. Negative for color change.       Hair Loss  Neurological:  Positive for headaches. Negative for dizziness, weakness and numbness.       Memory Loss  Hematological:  Does not bruise/bleed easily.  Psychiatric/Behavioral:  Positive for sleep disturbance. Negative for dysphoric mood. The patient is not nervous/anxious.   All other systems reviewed and are  negative.   Objective:  Vitals:   12/19/24 1456  BP: 130/82  Pulse: 74  Temp: 36.2 C (97.2 F)  TempSrc: Temporal  Weight: 83.9 kg (185 lb)  Height: 170.2 cm (5' 7)  PainSc:   3      Length of Stiffness: Few Minutes   GEN - Pleasant, No Apparent Distress  HEENT - normocephalic and atraumatic. Conjunctiva Clear. No Oral Ulcer Neck - supple with no adenopathy or thyromegaly.   C spine with full range of motion. Heart - regular rate and rhythm, No murmurs/gallops/rub, Nml S1S2 Lungs - clear to auscultation in all fields except for left lower lobe Extremities - there is no cyanosis; Right leg with lymph edema  Neurological - alert and oriented.  Spine - cervical paraspinal tenderness; lower lumbar spine tenderness when standing Skin - Subcutaneous Hardening of worse of the abdomen region; some improvement  MSK - The following joints were examined bilaterally: Hands, Wrists, Elbows, Shoulders, Metatarsals, Ankels, Knees and Hips; they were normal apart from what is noted.    100% Fist Formation DIP Enlargement  Right Leg with Lymphedema  No Synovitis or Dactylitis Strength 5/5 Proximal and Distal; right leg gives away due to hip surgery  [address] [place] ______________________________________________________________________ Labs/Imaging Reviewed in EMR Cr 0.50, AST 14, ALT 18 CBC Hgb 11.4, Hct 33.9, Plt 318 UA: No Protein or RBCs  TSH 1.511 CK 24; Aldolase 4.2 ESR 05; CRP 2 TPMT:  Normal Normal C3 and C4; Anti-DNA (Ds) Pos: ANA 1:160, Anti-TPO 181.8 (H), Thyroglobulin Antiboday 47.8 (h); RNP 1.1 Neg: ANCA, AntiMPO, AntiPR3, AntiCentromere, AntidsDNA, AntiSm, AntiU1 RNP, Anti SSA, Anti SSB, AntiScl-70, Antichromatin, AntiCCP, RF Antihistrone, Anticardiolipin IgG, IgM, IgA; Beta-2 Glycoprotein (IgG, IgA, IgM); lupus anticoagulant; 14.3.3 ETA, Anti-CarP, Anti-CEP, Anti-Sa   DEXA Scan (09/2024): Osteopenia with High Fracture Risk   Chest Xray: Normal without any  inflammation    Skin Biopsy (08/2021): showed superficial and deep perivascular and interstitial infiltrate with unusual features. The differential include palisaded granulmatous dermatitis such as GA, connective tissue disorder, morphea, less likely borelliosis.     Assessment and Plan   Diagnoses and all orders for this visit:  MCTD (mixed connective tissue disease) (HHS-HCC) -     Alanine Aminotransferase (ALT) -     Albumin -     Aspartate Aminotransferase (AST) -     CBC w/auto Differential (5 Part) -     Creatinine -     Sedimentation Rate-Westergren - [facility]  Long-term use of immunosuppressant medication -     ondansetron  (ZOFRAN -ODT) 4 MG disintegrating tablet; Take 1 tablet (4 mg total) by mouth every 8 (eight) hours as needed for Nausea -     Alanine Aminotransferase (ALT) -     Albumin -     Aspartate Aminotransferase (AST) -     CBC w/auto Differential (5 Part) -     Creatinine -     Sedimentation Rate-Westergren - [facility]  Rash and nonspecific skin eruption  Localized morphea  Osteopenia of multiple sites  Other orders -     Follow up in Rheumatology; Future     -- She has positive ANA, RNP, abnormal skin biopsy with morphea, joint pains, sun sensitivity, hair thinning raynaud's. Her other labs for lupus or Sjogren's or Scleroderma are negative.  -- Her daughter has been diagnosed with scleroderma.  -- Failed Imuran -- For raynaud's advised her to keep body warm. -- Continue Plaquenil  200 mg twice daily -- Continue Methotrexate taking 12.5 mg Monday and Tuesday per dermatology   -- Continue Gabapentin   -- Hydroxychloroquine  (Plaquenil ) is an immunosuppressive medication that require monitoring for eye toxicity. Dr.Shade  -- Methotrexate requires drug toxicity monitoring -- Patient has been educated on the potential adverse events associated with prednisone including but not limited to elevated blood sugars, decrease bone density as well as glaucoma and mood  affects. The patient is to keep regular follow up appointments so that patient can be monitored for the above adverse events.  -- Recommend Neurosurgery evaluation for the lumbar stenosis as I feel this is the cause of the problem.  -- Check labs  Follow up with Mayur Loree Blanch, MD in about 3 months (around 03/19/2025).   All new prescription medications, changes in current prescription dosages, and sample medications were discussed with the patient, including patient education, medication name, use, dosage, potential side effects, drug interactions, consequences of not using/taking, and special instructions.  Patient expressed understanding.  No barriers to adherence.   I appreciate the opportunity to participate in the care of [facility]. Please do not hesitate to contact me with any questions or concerns that may arise in regards to the patient's rheumatologic disease.    Attestation Statement:   I personally performed the service. (TP)  MAYUR LOREE BLANCH, MD

## 2024-12-20 ENCOUNTER — Ambulatory Visit

## 2024-12-20 ENCOUNTER — Ambulatory Visit (INDEPENDENT_AMBULATORY_CARE_PROVIDER_SITE_OTHER): Admitting: Licensed Clinical Social Worker

## 2024-12-20 ENCOUNTER — Ambulatory Visit (INDEPENDENT_AMBULATORY_CARE_PROVIDER_SITE_OTHER)

## 2024-12-20 DIAGNOSIS — F411 Generalized anxiety disorder: Secondary | ICD-10-CM | POA: Diagnosis not present

## 2024-12-20 DIAGNOSIS — Z8659 Personal history of other mental and behavioral disorders: Secondary | ICD-10-CM

## 2024-12-20 DIAGNOSIS — S42291D Other displaced fracture of upper end of right humerus, subsequent encounter for fracture with routine healing: Secondary | ICD-10-CM | POA: Diagnosis not present

## 2024-12-20 DIAGNOSIS — F3342 Major depressive disorder, recurrent, in full remission: Secondary | ICD-10-CM | POA: Diagnosis not present

## 2024-12-20 NOTE — Progress Notes (Signed)
 "  THERAPIST PROGRESS NOTE  Virtual Visit via Video Note  I connected with Elizabeth Martin on 12/20/24 at  9:00 AM EST by a video enabled telemedicine application and verified that I am speaking with the correct person using two identifiers.  Location: Patient: Address on file  Provider: [facility]   I discussed the limitations of evaluation and management by telemedicine and the availability of in person appointments. The patient expressed understanding and agreed to proceed.   I discussed the assessment and treatment plan with the patient. The patient was provided an opportunity to ask questions and all were answered. The patient agreed with the plan and demonstrated an understanding of the instructions.   The patient was advised to call back or seek an in-person evaluation if the symptoms worsen or if the condition fails to improve as anticipated.  I provided 47 minutes of non-face-to-face time during this encounter.   Elizabeth KATHEE Husband, Elizabeth Martin   Session Time: 9:02-9:49am  Participation Level: Active  Behavioral Response: CasualAlertEuthymic  Type of Therapy: Individual Therapy  Treatment Goals addressed:  Active     OP Depression     LTG: Reduce frequency, intensity, and duration of depression symptoms so that daily functioning is improved (Completed/Met)     Start:  11/25/23    Expected End:  04/24/24    Resolved:  07/23/24      STG: Bert will identify cognitive patterns and beliefs that support depression (Completed/Met)     Start:  11/25/23    Expected End:  04/24/24    Resolved:  07/23/24      LTG: Pt identifies the goal to Accept chronic pain and learning coping skills  (Progressing)     Start:  11/25/23    Expected End:  01/25/25       Goal Note     10/25/24: I'm getting better and getting better about speaking up to my doctors.          STG: Practice identifying 3 things she is grateful for 5/7 days out of the week for 2 months.  (Initial)      Start:  10/25/24    Expected End:  01/25/25            Work with Elizabeth to identify the major components of a recent episode of depression: physical symptoms, major thoughts and images, and major behaviors they experienced     Start:  11/25/23         Therapist will educate patient on cognitive distortions and the rationale for treatment of depression     Start:  11/25/23         Arthea will identify 2 cognitive distortions they are currently using and write reframing statements to replace them     Start:  11/25/23         Coping Skills      Start:  11/25/23       Will work with the pt using CBT/DBT techniques to help the pt verbalize an understanding of the cognitive, physiological, and behavioral components of depression and its treatment. This will be done by using worksheets, interactive activities, CBT/ABC thought logs, modeling, homework, role playing and journaling. Will work with pt to learn and implement coping skills that result in a reduction of depression and improve daily functioning per pt self report 3 out of 5 documented sessions.         Self Esteem:     Pt states a goal of:    07/23/24:  I think I have a little bit of social anxiety but I hide it really well. She believes this involves concerns how other's think about her related to her intelligence.     LTG: Pt reports she wants to feel more confident when she speaks.  (Not Progressing)     Start:  07/23/24    Expected End:  01/25/25      07/23/24: Pt reports she feels she is still concerned how other people judge her intellegence. Sites limitations due to age and conditions which impact her ability to find her words.     Goal Note     10/25/24: No improvement. Shares she has been engaging in reading on how to approach people and connect with others. Shares she has come to terms with differing levels of connection with friends.          STG: Go to the [employer] 1x/week for 2 months  (Initial)     Start:   10/25/24    Expected End:  01/25/25            CBT     Start:  07/23/24      Will Work with patient to decrease the frequency of negative self-descriptive statements and increase the frequency of positive self- descriptive statements using CBT/DBT/REBT techniques per patient self-report 3 out of 5 documented sessions. Some of the techniques that will be used will be CBT, positive affirmations, role playing, modeling, homework and journaling.           ProgressTowards Goals: Progressing  Interventions: Solution Focused and Supportive  Summary: Summary: Elizabeth Martin is a 64 y.o. female who presents with symptoms of depression and trauma. Patient identifies symptoms to include reexperiencing, uncontrollable worry, and hypervigilance. Pt was oriented times 5. Pt was cooperative and engaged. Pt denies SI/HI/AVH.   Updated on her health, she reported feeling better. She feels more awake and more mobile.  Reports she experiences occasional anxiety, but she is usually able to manage it. It tends to arise mostly in social settings. She found another girls' group online and wonders, Are they going to accept me? She is realizing that its okay for not everyone to like her.   Her goals include getting out of the house more and establishing a routine for walking. She wants to lead a more active lifestyle.  She explored ways to manage her expectations regarding her physical capabilities and sought solutions to address her day-to-day limitations.  Patient expressed a desire to work on anxiety around meeting new people. Explored situations that have recently led to social anxiety with patient reflecting on events with a group of friends whom she has been primed to expect possible negative interactions by.  Worked with patient to explore understanding of why her anxiety may come about as a result of previous experiences and challenged patient to explore if the same level of anxiety arises with meeting  a new friend group.  Suicidal/Homicidal: None without intent/plan.  Therapist Response: The clinician used active and supportive reflection to create a safe environment for the patient to process recent life stressors. They assessed her current symptoms, stressors, and safety since the last session. The clinician observed that the patient demonstrated improvement in her ability to remain hopeful and redirect her focus to alternative perspectives.  Worked with patient to explore solutions to improving mobility and incorporating exercise into her day-to-day routine.  Identified solutions to patient engaging in exposure work to challenge her belief systems around social anxiety.  Plan:  Return again in 4 weeks.   Diagnosis: MDD (major depressive disorder), recurrent, in full remission   GAD (generalized anxiety disorder)   History of posttraumatic stress disorder (PTSD)     Collaboration of Care: [facility] psychiatrist can access notes and cln. Will review psychiatrists' notes. Check in with the patient and will see Elizabeth Martin per availability. Patient agreed with treatment recommendations.  Patient/Guardian was advised Release of Information must be obtained prior to any record release in order to collaborate their care with an outside provider. Patient/Guardian was advised if they have not already done so to contact the registration department to sign all necessary forms in order for us  to release information regarding their care.   Consent: Patient/Guardian gives verbal consent for treatment and assignment of benefits for services provided during this visit. Patient/Guardian expressed understanding and agreed to proceed.   Elizabeth KATHEE Husband, Elizabeth Martin 12/20/2024  "

## 2024-12-20 NOTE — Progress Notes (Signed)
 Orthopedic Follow-Up Note  12 weeks status post closed treatment right proximal humerus fracture    SUBJECTIVE:   Elizabeth Martin is a 64 y.o. year old  who presents for follow up of right proximal humerus fracture with closed treatment.  Patient was last seen in office on 11/05/24.  At that appointment, patient was roughly 5 weeks status post injury.  She was instructed to begin formal physical therapy.  Patient reports she has been doing very well from her right shoulder injury.  Patient has been going to therapy as instructed following nonoperative proximal humerus protocol.  She has noticed improvement with range of motion as well as overall function.  She does admit to some intermittent pain that radiates from her lateral shoulder to her right elbow that she describes as a stretching sensation.  Overall, patient is happy with her progress to date.  No new complaints today per patient.     Past Medical History:  Diagnosis Date   Anxiety    Asthma    Autoimmune disease    Cancer (HCC)    Endometrial lymph node removal (30)   Corneal abrasion, right 05/24/2022   Depression    GERD (gastroesophageal reflux disease)    H/O blood clots    upper rt groin and behind both knees   History of hiatal hernia    Hypertension    Lymphedema    right leg   Morphea scleroderma    PONV (postoperative nausea and vomiting)    states gets violently ill   Past Surgical History:  Procedure Laterality Date   BASAL CELL CARCINOMA EXCISION Left    CHOLECYSTECTOMY  2008   FLEXIBLE SIGMOIDOSCOPY N/A 05/19/2022   Procedure: FLEXIBLE SIGMOIDOSCOPY;  Surgeon: Toledo, Ladell POUR, MD;  Location: [facility];  Service: Gastroenterology;  Laterality: N/A;   HERNIA REPAIR  2004   ILEOSTOMY CLOSURE N/A 08/24/2022   Procedure: ILEOSTOMY TAKEDOWN, open loop with Arthea Platt, PA-C to assist;  Surgeon: Desiderio Schanz, MD;  Location: [facility];  Service: General;  Laterality: N/A;   IVC FILTER INSERTION N/A  11/09/2023   Procedure: IVC FILTER INSERTION;  Surgeon: Marea Selinda RAMAN, MD;  Location: [facility];  Service: Cardiovascular;  Laterality: N/A;   IVC FILTER REMOVAL N/A 01/02/2024   Procedure: IVC FILTER REMOVAL;  Surgeon: Marea Selinda RAMAN, MD;  Location: [facility];  Service: Cardiovascular;  Laterality: N/A;   PLANTAR FASCIECTOMY     ROBOTIC ASSISTED TOTAL HYSTERECTOMY WITH BILATERAL SALPINGO OOPHERECTOMY  02/14/2012   ROTATOR CUFF REPAIR Left 03/24/2022   TONSILLECTOMY     TOTAL HIP ARTHROPLASTY Right 11/14/2023   Procedure: TOTAL HIP ARTHROPLASTY - posterior;  Surgeon: Lorelle Arthea, MD;  Location: [facility];  Service: Orthopedics;  Laterality: Right;   XI ROBOTIC ASSISTED LOWER ANTERIOR RESECTION N/A 05/24/2022   Procedure: XI ROBOTIC ASSISTED LOWER ANTERIOR RESECTION;  Surgeon: Desiderio Schanz, MD;  Location: [facility];  Service: General;  Laterality: N/A;   Current Medications[1] Allergies[2] Social History   Socioeconomic History   Marital status: Married    Spouse name: Not on file   Number of children: 2   Years of education: Not on file   Highest education level: Bachelor's degree (e.g., BA, AB, BS)  Occupational History   Not on file  Tobacco Use   Smoking status: Never   Smokeless tobacco: Never  Vaping Use   Vaping status: Never Used  Substance and Sexual Activity   Alcohol use: Yes   Drug use:  Never   Sexual activity: Not Currently  Other Topics Concern   Not on file  Social History Narrative   ** Merged History Encounter **       Social Drivers of Health   Tobacco Use: Low Risk  (12/19/2024)   Received from [facility]   Patient History    Smoking Tobacco Use: Never    Smokeless Tobacco Use: Never    Passive Exposure: Not on file  Financial Resource Strain: Low Risk  (12/18/2024)   Received from [facility]   Overall Financial Resource Strain (CARDIA)    Difficulty of Paying Living Expenses: Not hard at all   Food Insecurity: No Food Insecurity (12/18/2024)   Received from [facility]   Epic    Within the past 12 months, you worried that your food would run out before you got the money to buy more.: Never true    Within the past 12 months, the food you bought just didn't last and you didn't have money to get more.: Never true  Transportation Needs: No Transportation Needs (12/18/2024)   Received from [facility] - [facility]    In the past 12 months, has lack of transportation kept you from medical appointments or from getting medications?: No    Lack of Transportation (Non-Medical): No  Physical Activity: Not on file  Stress: Not on file  Social Connections: Unknown (02/01/2023)   Received from [facility]   Social Network    Social Network: Not on file  Intimate Partner Violence: Not At Risk (11/14/2023)   Humiliation, Afraid, Rape, and Kick questionnaire    Fear of Current or Ex-Partner: No    Emotionally Abused: No    Physically Abused: No    Sexually Abused: No  Depression (PHQ2-9): Low Risk (11/08/2024)   Depression (PHQ2-9)    PHQ-2 Score: 1  Alcohol Screen: Not on file  Housing: Low Risk  (12/18/2024)   Received from [facility]   Epic    In the last 12 months, was there a time when you were not able to pay the mortgage or rent on time?: No    In the past 12 months, how many times have you moved where you were living?: 0    At any time in the past 12 months, were you homeless or living in a shelter (including now)?: No  Utilities: Not At Risk (12/18/2024)   [facility]   Epic    In the past 12 months has the electric, gas, oil, or water  company threatened to shut off services in your home?: No  Health Literacy: Low Risk (08/20/2023)   Received from [facility] Literacy    How often do you need to have someone help you when you read instructions, pamphlets, or other  written material from your doctor or pharmacy?: Never   Family History  Problem Relation Age of Onset   Mental illness Neg Hx      ROS: A review of systems was performed and is negative unless stated above in HPI    OBJECTIVE:    Constitutional:   The patient is alert and oriented x 3, appears to be stated age and in no distress.   Orthopaedic Examination:    Shoulder Examination (focused):   Right upper extremity: -Compartments soft and compressible about the proximal arm -Inspection of the shoulder reveals no gross deformity.  Nontender to palpation about the proximal humerus.   -Active shoulder forward flexion to 80 degrees without pain.  Passive shoulder forward flexion greater than 100 degrees.  Active shoulder external rotation with the arm at the side to 40 degrees.  Passive shoulder external rotation to 45 degrees. -2+ radial pulse, digits are warm and well perfused -Sensation intact to light touch over the radial, ulnar, median, axillary nerve distributions -No pain with active ROM at elbow, wrist, and hand/fingers.   IMAGING:   XR Shoulder Imaging: X-rays of the Right shoulder including AP/grashey/scapular Y views obtained today 12/20/2024 at [facility] at [facility] were reviewed personally by me and with patient.  Per my independent interpretation these images show redemonstrated proximal humerus fracture with interval healing at multiple fracture sites.  There is a mildly displaced greater tuberosity fragment with no interval shift in comparison to prior outside imaging.  Also noted is oblique fracture through the medial metaphyseal region with slight step-off of the medial cortex of the proximal humerus.  This fracture component is also stable in comparison to prior imaging. Appropriate bridging callus formation noted through the fracture sites.  No new fractures appreciated on today's films.     ASSESSMENT:  12 weeks status post non-operative  right proximal humerus fracture - progressing well     PLAN:   Patient was seen in office today for follow up of nonoperative right proximal humerus fracture.  Patient is approximately 12 weeks status post injury.  Clinically, patient is doing well.  She has minimal lateral humerus based pain which I suspect is due to the rehabilitation process.  Patient continues to work on passive and active range of motion following the nonoperative proximal humerus fracture protocol.  Functionally, patient has noticed significant improvement since last encounter.  Radiographically, x-rays in office today demonstrate appropriate bridging callus noted at the fracture sites with interval healing and no change in alignment from prior radiographs.  We discussed patient's progress and goals including continuation of formal physical therapy focusing on restoring range of motion and then progressing into the strengthening phase.  Plan will be for patient to follow-up in office in 3 months for repeat evaluation with Dr. Onesimo.  She may notify the office sooner with any questions or concerns.  Follow up: 3 months with Dr. Onesimo  I discussed with the patient today that I will be transitioning out of my role within the near future. In order to provide appropriate continuity of care, we offered the patient options for follow up regarding their orthopedic concerns. Patient has chosen to follow up with Dr. Onesimo at [facility].  Patient may reach out to our office if there are any difficulties in scheduling follow up care. The patient understands who to contact for future orthopedic concerns and has contact information for the receiving practice.   Arlyss Schneider, DO Orthopedic Surgery & Sports Medicine [facility]      [1]  Current Outpatient Medications:    acetaminophen  (TYLENOL ) 500 MG tablet, Take 2 tablets (1,000 mg total) by mouth every 8 (eight) hours., Disp: 30 tablet, Rfl: 0   albuterol   (VENTOLIN  HFA) 108 (90 Base) MCG/ACT inhaler, Inhale 2 puffs into the lungs every 6 (six) hours as needed for shortness of breath or wheezing., Disp: , Rfl:    Alum Hydroxide-Mag Carbonate (GAVISCON PO), Take 4 tablets by mouth daily as needed (upset stomach)., Disp: , Rfl:    budesonide-formoterol (SYMBICORT) 80-4.5 MCG/ACT inhaler, Inhale 2 puffs into the  lungs., Disp: , Rfl:    buPROPion  (WELLBUTRIN  XL) 150 MG 24 hr tablet, Take 1 tablet (150 mg total) by mouth in the morning., Disp: 90 tablet, Rfl: 3   celecoxib  (CELEBREX ) 200 MG capsule, Take 200 mg by mouth 2 (two) times daily., Disp: , Rfl:    cetirizine (ZYRTEC) 10 MG tablet, Take 10 mg by mouth daily., Disp: , Rfl:    CLOBETASOL PROPIONATE E 0.05 % emollient cream, Apply topically., Disp: , Rfl:    desonide (DESOWEN) 0.05 % cream, Apply 1 Application topically 2 (two) times daily as needed (morphea flare)., Disp: , Rfl:    famotidine -calcium carbonate-magnesium  hydroxide (PEPCID  COMPLETE) 10-800-165 MG chewable tablet, Chew 1 tablet by mouth daily as needed (heartburn)., Disp: , Rfl:    folic acid (FOLVITE) 1 MG tablet, Take 1 mg by mouth daily., Disp: , Rfl:    gabapentin  (NEURONTIN ) 300 MG capsule, Take 600 mg by mouth 3 (three) times daily., Disp: , Rfl:    hydrocortisone-pramoxine (ANALPRAM-HC) 2.5-1 % rectal cream, Place 1 Application rectally 3 (three) times daily as needed for hemorrhoids., Disp: , Rfl:    hydroxychloroquine  (PLAQUENIL ) 200 MG tablet, Take 200 mg by mouth 2 (two) times daily., Disp: , Rfl:    lisinopril  (ZESTRIL ) 40 MG tablet, Take 40 mg by mouth daily., Disp: , Rfl:    methotrexate (RHEUMATREX) 2.5 MG tablet, Take 10 mg by mouth 2 (two) times a week. Take 10 mg on Monday and Tuesday, Disp: , Rfl:    omeprazole (PRILOSEC) 40 MG capsule, Take 40 mg by mouth daily., Disp: , Rfl:    ondansetron  (ZOFRAN -ODT) 4 MG disintegrating tablet, Take 4 mg by mouth every 8 (eight) hours as needed for nausea or vomiting., Disp: ,  Rfl:    ondansetron  (ZOFRAN -ODT) 8 MG disintegrating tablet, Take 8 mg by mouth every 8 (eight) hours as needed., Disp: , Rfl:    polyethylene glycol (MIRALAX  / GLYCOLAX ) 17 g packet, Take 17 g by mouth., Disp: , Rfl:    promethazine  (PHENERGAN ) 12.5 MG tablet, Take 12.5 mg by mouth every 8 (eight) hours as needed., Disp: , Rfl:    scopolamine  (TRANSDERM-SCOP) 1 MG/3DAYS, Place 1 mg onto the skin., Disp: , Rfl:    silver sulfADIAZINE (SILVADENE) 1 % cream, Apply 1 Application topically daily., Disp: , Rfl:    traZODone  (DESYREL ) 50 MG tablet, TAKE 1 TABLET BY MOUTH AT BEDTIME AS NEEDED FOR SLEEP., Disp: 90 tablet, Rfl: 0   triamcinolone  (KENALOG ) 0.025 % cream, Apply 1 Application topically 2 (two) times daily as needed (morphea flare)., Disp: , Rfl:    trospium  (SANCTURA ) 20 MG tablet, Take 1 tablet (20 mg total) by mouth 2 (two) times daily as needed (overactive bladder)., Disp: 180 tablet, Rfl: 1   venlafaxine  XR (EFFEXOR -XR) 75 MG 24 hr capsule, Take 1 capsule (75 mg total) by mouth daily with breakfast., Disp: 90 capsule, Rfl: 3 [2]  Allergies Allergen Reactions   Carboplatin Anaphylaxis        Hydromorphone  Anaphylaxis    Stopped breathing  Respiratory arrest - due to dosage administered per the patient.   Latex Hives, Itching and Rash   Nitrofurantoin Nausea And Vomiting   Silicone Itching and Rash   Codeine Nausea And Vomiting   Other Nausea And Vomiting    Any codone medications Violently sick even with zofran  per patient report    Wheat Other (See Comments)    Body aches and swelling    Oxycodone  Nausea And Vomiting

## 2024-12-20 NOTE — Patient Instructions (Addendum)
 [facility] [address], [place] [address] [phone]   Thank you for visiting the office today. We appreciate your trust and allowing us  to help you with your orthopedic needs. We have discussed orthopedic transition of care at today's appointment. If you have any difficulties with referral or follow up, please notify the office.  If you experience life-threatening symptoms or it cannot wait until normal office hours, please go to the nearest Emergency Department for immediate evaluation.

## 2024-12-23 NOTE — Progress Notes (Unsigned)
 PROVIDER NOTE: Interpretation of information contained herein should be left to medically-trained personnel. Specific patient instructions are provided elsewhere under Patient Instructions section of medical record. This document was created in part using AI and STT-dictation technology, any transcriptional errors that may result from this process are unintentional.  Patient: Elizabeth Martin  Service: E/M   PCP: Tobie Border, MD  DOB: 1961-03-08  DOS: 12/24/2024  Provider: Eric DELENA Como, MD  MRN: [id number]  Delivery: Face-to-face  Specialty: Interventional Pain Management  Type: Established Patient  Setting: Ambulatory outpatient facility  Specialty designation: 09  Referring Prov.: Tobie Border, MD  Location: Outpatient office facility       History of present illness (HPI) Elizabeth Martin, a 64 y.o. year old female, is here today because of her Low back pain of over 3 months duration [M54.50]. [name] primary complain today is No chief complaint on file.  Pertinent problems: Ms. Leas has Ileostomy in place [facility]); Polyarthralgia; Edema of lower extremity (Right); Primary malignant neoplasm of endometrium (HCC); Adhesive capsulitis of left shoulder; Glenohumeral arthritis, left; History of endometrial cancer; Malignant neoplastic disease (HCC); Migraine without aura and without status migrainosus, not intractable; MCTD (mixed connective tissue disease); Myofascial pain; Chronic neck pain (3ry area of Pain) (Bilateral); Osteoarthritis of left knee; Pain of right lower extremity; Paresthesia of lower extremity; Patellofemoral pain syndrome of right knee; Right shoulder pain; History of [facility] spotted fever; Strain of gastrocnemius tendon; Pain in pelvis; Upper back pain; Multiple joint pain; Cervical radiculopathy; Cervical facet joint syndrome; Basal cell carcinoma of skin; Chronic low back pain (1ry area of Pain) (Bilateral) (R>L) w/o sciatica; Acute bilateral low  back pain without sciatica; History of total hip arthroplasty (THR) (Right); Pain in right knee; Arthralgia of multiple joints; Unilateral primary osteoarthritis, left knee; Decreased range of motion of right knee; Malignant neoplasm of uterus (HCC); Acute midline low back pain without sciatica; Chronic pain syndrome; Abnormal MRI, lumbar spine (01/10/2023) [facility]); Abnormal NCS (nerve conduction studies) (01/25/2023); Chronic hip pain (2ry area of Pain) (Bilateral) (R>L); Chronic hip pain after total hip replacement (THR) (Right); Cervicalgia; 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  Pain Assessment: Severity of   is reported as a  /10. Location:    / . Onset:  . Quality:  . Timing:  . Modifying factor(s):  SABRA Vitals:  vitals were not taken for this visit.  BMI: Estimated body mass index is 30.23 kg/m as calculated from the following:   Height as of 11/13/24: 5' 7 (1.702 m).   Weight as of 11/13/24: 193 lb (87.5 kg).  Last encounter: 10/22/2024. Last procedure: 11/13/2024.  Reason for encounter: post-procedure evaluation and assessment.   Discussed the use of AI scribe software for clinical note transcription with the patient, who gave verbal consent to proceed.  History of Present Illness          Post-Procedure Evaluation   Type: Lumbar Facet, Medial Branch Block(s) (w/ fluoroscopic mapping) #1  Laterality: Bilateral  Level: L3, L4, L5, and S1 Medial Branch/Dorsal Rami Level(s). Injecting  these levels  blocks the L4-5 and L5-S1 lumbar facet joints. Imaging: Fluoroscopic guidance Spinal ([id number]) Anesthesia: Local anesthesia (1-2% Lidocaine ) Anxiolysis: IV Versed  2.0 mg            Sedation: Minimal Sedation Fentanyl  1 mL (50 mcg) DOS: 11/13/2024 Performed by: Eric DELENA Como, MD  Primary Purpose: Diagnostic/Therapeutic Indications: Low back pain severe enough to impact quality of life or function. 1. Chronic low back pain (1ry area of Pain) (Bilateral) (R>L) w/o sciatica   2. Lumbar Anterolisthesis of (L4/L5) (8 mm on flexion) (Dynamic)   3. Lumbar pars defect (Right) (L4) (postsurgical)   4. Low back pain of over 3 months duration   5. Multifactorial low back pain   6. Lumbar facet joint pain   7. Lumbar facet hypertrophy   8. Lumbar facet joint syndrome   9. Abnormal MRI, lumbar spine (01/10/2023) [facility])    Latex precautions, history of latex allergy    Chronic anticoagulation (Plaquenil )    NAS-11 Pain score:   Pre-procedure: 8 /10   Post-procedure: 0-No pain/10     Effectiveness:  Initial hour after procedure:   ***. Subsequent 4-6 hours post-procedure:   ***. Analgesia past initial 6 hours:   ***. Ongoing improvement:  Analgesic:  *** Function:    ***    ROM:    ***    Interpretation: ***  Pharmacotherapy Assessment   Opioid Analgesic: None MME/day: 0 mg/day   Monitoring: [place] PMP: PDMP reviewed during this encounter.       Pharmacotherapy: No side-effects or adverse reactions reported. Compliance: No problems identified. Effectiveness: Clinically acceptable.  No notes on file  UDS:  Summary  Date Value Ref Range Status  09/05/2024 FINAL  Final    Comment:    ==================================================================== Compliance Drug Analysis, Ur ==================================================================== Specimen Alert Note: Urinary creatinine is low; ability to detect some drugs may be compromised. Interpret results  with caution. (Creatinine) ==================================================================== Test                             Result       Flag       Units  Drug Present and Declared for Prescription Verification   Gabapentin                      PRESENT      EXPECTED   Hydroxybupropion               PRESENT      EXPECTED    Hydroxybupropion is an expected metabolite of bupropion .    Venlafaxine                     PRESENT      EXPECTED   Desmethylvenlafaxine           PRESENT      EXPECTED    Desmethylvenlafaxine is an expected metabolite of venlafaxine .  Drug Present not Declared for Prescription Verification   Carboxy-THC                    2938         UNEXPECTED ng/mg creat    Carboxy-THC is a metabolite of tetrahydrocannabinol (THC). Source of    THC is most commonly herbal marijuana or marijuana-based products,    but THC is also present in a scheduled prescription medication.    Trace amounts of THC can be present in hemp and cannabidiol (CBD)  products. This test is not intended to distinguish between delta-9-    tetrahydrocannabinol, the predominant form of THC in most herbal or    marijuana-based products, and delta-8-tetrahydrocannabinol.    Dextromethorphan               PRESENT      UNEXPECTED   Dextrorphan/Levorphanol        PRESENT      UNEXPECTED    Dextrorphan is an expected metabolite of dextromethorphan, an over-    the-counter or prescription cough suppressant. Dextrorphan cannot be    distinguished from the scheduled prescription medication levorphanol    by the method used for analysis.    Guaifenesin                    PRESENT      UNEXPECTED    Guaifenesin may be administered as an over-the-counter or    prescription drug; it may also be present as a breakdown product of    methocarbamol.  Drug Absent but Declared for Prescription Verification   Trazodone                       Not Detected UNEXPECTED   Acetaminophen                   Not Detected  UNEXPECTED    Acetaminophen , as indicated in the declared medication list, is not    always detected even when used as directed.    Promethazine                    Not Detected UNEXPECTED ==================================================================== Test                      Result    Flag   Units      Ref Range   Creatinine              13        LL     mg/dL      >=79 ==================================================================== Declared Medications:  The flagging and interpretation on this report are based on the  following declared medications.  Unexpected results may arise from  inaccuracies in the declared medications.   **Note: The testing scope of this panel includes these medications:   Bupropion  (Wellbutrin  XL)  Gabapentin  (Neurontin )  Promethazine  (Phenergan )  Trazodone  (Desyrel )  Venlafaxine  (Effexor )   **Note: The testing scope of this panel does not include small to  moderate amounts of these reported medications:   Acetaminophen  (Tylenol )   **Note: The testing scope of this panel does not include the  following reported medications:   Albuterol  (Ventolin  HFA)  Aluminum hydroxide  Amoxicillin (Amoxil)  Budesonide (Symbicort)  Buspirone  (Buspar )  Celecoxib  (Celebrex )  Cephalexin  (Keflex )  Cetirizine (Zyrtec)  Famotidine  (Pepcid )  Folic Acid (Folvite)  Formoterol (Symbicort)  Hydroxychloroquine  (Plaquenil )  Lisinopril  (Zestril )  Lubiprostone (Amitiza)  Magnesium   Methotrexate  Metronidazole  (Flagyl )  Nystatin  (Mycostatin )  Omeprazole (Prilosec)  Ondansetron  (Zofran )  Phenazopyridine (Pyridium)  Polyethylene Glycol (MiraLAX )  Polymyxin B (Polytrim)  Prednisone (Deltasone)  Scopolamine   Topical  Trimethoprim (Polytrim)  Trospium  (Sanctura ) ==================================================================== For clinical consultation, please call [phone]. ====================================================================      No results found for: CBDTHCR No results found for: D8THCCBX No results found for: D9THCCBX  ROS  Constitutional: Denies any fever or chills Gastrointestinal: No reported hemesis, hematochezia, vomiting, or acute GI distress Musculoskeletal: Denies any acute onset joint swelling, redness, loss of  ROM, or weakness Neurological: No reported episodes of acute onset apraxia, aphasia, dysarthria, agnosia, amnesia, paralysis, loss of coordination, or loss of consciousness  Medication Review  Alum Hydroxide-Mag Carbonate, Clobetasol Prop Emollient Base, acetaminophen , albuterol , buPROPion , budesonide-formoterol, celecoxib , cetirizine, desonide, famotidine -calcium carbonate-magnesium  hydroxide, folic acid, gabapentin , hydrocortisone-pramoxine, hydroxychloroquine , lisinopril , methotrexate, omeprazole, ondansetron , polyethylene glycol, promethazine , scopolamine , silver sulfADIAZINE, traZODone , triamcinolone , trospium , and venlafaxine  XR  History Review  Allergy: Ms. Scharf is allergic to carboplatin, hydromorphone , latex, nitrofurantoin, silicone, codeine, other, wheat, and oxycodone . Drug: Ms. Fahy  reports no history of drug use. Alcohol:  reports current alcohol use. Tobacco:  reports that she has never smoked. She has never used smokeless tobacco. Social: Ms. Hines  reports that she has never smoked. She has never used smokeless tobacco. She reports current alcohol use. She reports that she does not use drugs. Medical:  has a past medical history of Anxiety, Asthma, Autoimmune disease, Cancer (HCC), Corneal abrasion, right (05/24/2022), Depression, GERD (gastroesophageal reflux disease), H/O blood clots, History of hiatal hernia, Hypertension, Lymphedema, Morphea scleroderma, and PONV (postoperative nausea and vomiting). Surgical: Ms. Pfefferle  has a past surgical history that includes Tonsillectomy; Cholecystectomy (2008); Hernia repair (2004); Plantar fasciectomy; Excision basal cell  carcinoma (Left); Flexible sigmoidoscopy (N/A, 05/19/2022); Robotic assisted total hysterectomy with bilateral salpingo oophorectomy (02/14/2012); Rotator cuff repair (Left, 03/24/2022); XI robotic assisted lower anterior resection (N/A, 05/24/2022); Ileostomy closure (N/A, 08/24/2022); IVC FILTER INSERTION (N/A, 11/09/2023); Total hip arthroplasty (Right, 11/14/2023); and IVC FILTER REMOVAL (N/A, 01/02/2024). Family: family history is not on file.  Laboratory Chemistry Profile   Renal Lab Results  Component Value Date   BUN 14 09/05/2024   CREATININE 0.72 09/05/2024   BCR 19 09/05/2024   GFRNONAA >60 01/09/2024    Hepatic Lab Results  Component Value Date   AST 16 09/05/2024   ALT 11 01/09/2024   ALBUMIN 4.1 09/05/2024   ALKPHOS 62 09/05/2024   LIPASE 30 01/09/2024    Electrolytes Lab Results  Component Value Date   NA 139 09/05/2024   K 4.8 09/05/2024   CL 99 09/05/2024   CALCIUM 9.3 09/05/2024   MG 2.4 (H) 09/05/2024   PHOS 3.6 02/12/2023    Bone Lab Results  Component Value Date   25OHVITD1 38 09/05/2024   25OHVITD2 1.4 09/05/2024   25OHVITD3 37 09/05/2024    Inflammation (CRP: Acute Phase) (ESR: Chronic Phase) Lab Results  Component Value Date   CRP 2 09/05/2024   ESRSEDRATE 6 09/05/2024   LATICACIDVEN 1.5 05/18/2022         Note: Above Lab results reviewed.  Recent Imaging Review  DG PAIN CLINIC C-ARM 1-60 MIN NO REPORT Fluoro was used, but no Radiologist interpretation will be provided.  Please refer to NOTES tab for provider progress note. Note: Reviewed        Physical Exam  Vitals: There were no vitals taken for this visit. BMI: Estimated body mass index is 30.23 kg/m as calculated from the following:   Height as of 11/13/24: 5' 7 (1.702 m).   Weight as of 11/13/24: 193 lb (87.5 kg). Ideal: Patient weight not recorded General appearance: Well nourished, well developed, and well hydrated. In no apparent acute distress Mental status: Alert, oriented x  3 (person, place, & time)       Respiratory: No evidence of acute respiratory distress Eyes: PERLA   Assessment   Diagnosis Status  1. Low back pain of over 3 months duration   2. Chronic low back pain (1ry area of Pain) (  Bilateral) (R>L) w/o sciatica   3. Lumbar facet joint pain   4. Postop check    Controlled Controlled Controlled   Updated Problems: No problems updated.  Plan of Care  Problem-specific:  Assessment and Plan            Ms. Stephenie Navejas has a current medication list which includes the following long-term medication(s): albuterol , bupropion , cetirizine, famotidine -calcium carbonate-magnesium  hydroxide, lisinopril , omeprazole, trazodone , and venlafaxine  xr.  Pharmacotherapy (Medications Ordered): No orders of the defined types were placed in this encounter.  Orders:  No orders of the defined types were placed in this encounter.    Interventional Therapies  Risk Factors  Considerations  Medical Comorbidities:  Plaquenil  Anticoagulation: (Stop: 11 days)  (09/05/2024) UDS (+) carboxy-THC  ALLERGY: Latex, hydromorphone , codeine, oxycodone , silicone  Hx. Cancer (Skin, Uterus, Endometrium)  GERD  HTN  BA  Hashimoto's Disease  PTSD  GAD  Raynaud's Disease  Hx. Bowel Obstruction w/ partial colectomy  MCTD (mixed connective tissue disease)  Polyarthralgia  Hx. DVT     Planned  Pending:   Diagnostic bilateral lumbar facet MBB #1    Under consideration:   Diagnostic bilateral lumbar facet MBB #1    Completed: (Analgesic benefit)1  None at this time   Therapeutic  Palliative (PRN) options:   None established   Completed by other providers:   Diagnostic LE EMG/PNCV (01/25/2023) by Luetta Sport, MD [facility]) Dx: Chronic right L4 radiculopathy without active denervation. (Pre-operative) Surgery: Right L4-5 microdiscectomy (04/11/2023) by Lugene Ozell Faden, MD [facility]) Therapeutic left hip injection x1  (08/30/2023) by Prentice Reges, DO ([facility])  Therapeutic right hip injection x1 (08/12/2023) by Prentice Reges, DO ([facility])  Diagnostic/therapeutic right L4 & L5 transforaminal ESI x1 (02/21/2023) by Franky Ambrosia, DO [facility])   1(Analgesic benefit): Expressed in percentage (%). (Local anesthetic[LA] +/- sedation  L.A.Local Anesthetic  Steroid benefit  Ongoing benefit)     No follow-ups on file.    Recent Visits Date Type Provider Dept  11/13/24 Procedure visit Tanya Glisson, MD [facility]  10/22/24 Office Visit Tanya Glisson, MD [facility]  Showing recent visits within past 90 days and meeting all other requirements Future Appointments Date Type Provider Dept  12/24/24 Appointment Tanya Glisson, MD [facility]  Showing future appointments within next 90 days and meeting all other requirements  I discussed the assessment and treatment plan with the patient. The patient was provided an opportunity to ask questions and all were answered. The patient agreed with the plan and demonstrated an understanding of the instructions.  Patient advised to call back or seek an in-person evaluation if the symptoms or condition worsens.  Duration of encounter: *** minutes.  Total time on encounter, as per AMA guidelines included both the face-to-face and non-face-to-face time personally spent by the physician and/or other qualified health care professional(s) on the day of the encounter (includes time in activities that require the physician or other qualified health care professional and does not include time in activities normally performed by clinical staff). Physician's time may include the following activities when performed: Preparing to see the patient (e.g., pre-charting review of records, searching for previously ordered imaging, lab work, and nerve conduction tests) Review of prior analgesic pharmacotherapies. Reviewing PMP Interpreting ordered tests  (e.g., lab work, imaging, nerve conduction tests) Performing post-procedure evaluations, including interpretation of diagnostic procedures Obtaining and/or reviewing separately obtained history Performing a medically appropriate examination and/or evaluation Counseling and educating the patient/family/caregiver Ordering medications,  tests, or procedures Referring and communicating with other health care professionals (when not separately reported) Documenting clinical information in the electronic or other health record Independently interpreting results (not separately reported) and communicating results to the patient/ family/caregiver Care coordination (not separately reported)  Note by: Eric DELENA Como, MD (TTS and AI technology used. I apologize for any typographical errors that were not detected and corrected.) Date: 12/24/2024; Time: 6:56 PM

## 2024-12-24 ENCOUNTER — Ambulatory Visit: Admitting: Pain Medicine

## 2024-12-24 ENCOUNTER — Encounter: Payer: Self-pay | Admitting: Pain Medicine

## 2024-12-24 VITALS — BP 151/79 | HR 76 | Temp 97.2°F | Resp 18 | Ht 67.0 in | Wt 180.0 lb

## 2024-12-24 DIAGNOSIS — M4310 Spondylolisthesis, site unspecified: Secondary | ICD-10-CM | POA: Insufficient documentation

## 2024-12-24 DIAGNOSIS — M47812 Spondylosis without myelopathy or radiculopathy, cervical region: Secondary | ICD-10-CM | POA: Insufficient documentation

## 2024-12-24 DIAGNOSIS — G8929 Other chronic pain: Secondary | ICD-10-CM | POA: Insufficient documentation

## 2024-12-24 DIAGNOSIS — M5459 Other low back pain: Secondary | ICD-10-CM | POA: Insufficient documentation

## 2024-12-24 DIAGNOSIS — Z09 Encounter for follow-up examination after completed treatment for conditions other than malignant neoplasm: Secondary | ICD-10-CM | POA: Diagnosis not present

## 2024-12-24 DIAGNOSIS — M542 Cervicalgia: Secondary | ICD-10-CM | POA: Diagnosis not present

## 2024-12-24 DIAGNOSIS — M545 Low back pain, unspecified: Secondary | ICD-10-CM | POA: Diagnosis not present

## 2024-12-24 DIAGNOSIS — Z7901 Long term (current) use of anticoagulants: Secondary | ICD-10-CM | POA: Diagnosis not present

## 2024-12-24 DIAGNOSIS — M4312 Spondylolisthesis, cervical region: Secondary | ICD-10-CM | POA: Insufficient documentation

## 2024-12-24 NOTE — Patient Instructions (Addendum)
 Stop Hydroxychloroquine  for 11 days.  ______________________________________________________________________    Blood Thinners  IMPORTANT NOTICE:  If you take any of these, make sure to notify the nursing staff.  Failure to do so may result in serious injury.  Recommended time intervals to stop and restart blood-thinners, before & after invasive procedures  Generic Name Brand Name Pre-procedure: Stop medication for this amount of time before your procedure: Post-procedure: Wait this amount of time after the procedure before restarting your medication:  Abciximab Reopro 15 days 2 hrs  Alteplase Activase 10 days 10 days  Anagrelide Agrylin    Apixaban  Eliquis  3 days 6 hrs  Cilostazol Pletal 3 days 5 hrs  Clopidogrel  Plavix  7-10 days 2 hrs  Dabigatran Pradaxa 5 days 6 hrs  Dalteparin Fragmin 24 hours 4 hrs  Dipyridamole Aggrenox 11days 2 hrs  Edoxaban Lixiana; Savaysa 3 days 2 hrs  Enoxaparin   Lovenox  24 hours 4 hrs  Eptifibatide Integrillin 8 hours 2 hrs  Fondaparinux  Arixtra 72 hours 12 hrs  Hydroxychloroquine  Plaquenil  11 days   Prasugrel Effient 7-10 days 6 hrs  Reteplase Retavase 10 days 10 days  Rivaroxaban Xarelto 3 days 6 hrs  Ticagrelor Brilinta 5-7 days 6 hrs  Ticlopidine Ticlid 10-14 days 2 hrs  Tinzaparin Innohep 24 hours 4 hrs  Tirofiban Aggrastat 8 hours 2 hrs  Warfarin Coumadin 5 days 2 hrs   Other medications with blood-thinning effects  NOTE: Consider stopping these if you have prolonged bleeding despite not taking any of the above blood thinners. Otherwise ask your provider and this will be decided on a case-by-case basis.  Product indications Generic (Brand) names Note  Cholesterol Lipitor Stop 4 days before procedure  Blood thinner (injectable) Heparin  (LMW or LMWH Heparin ) Stop 24 hours before procedure  Cancer Ibrutinib (Imbruvica) Stop 7 days before procedure  Malaria/Rheumatoid Hydroxychloroquine  (Plaquenil ) Stop 11 days before procedure  Thrombolytics   10 days before or after procedures   Over-the-counter (OTC) Products with blood-thinning effects  NOTE: Consider stopping these if you have prolonged bleeding despite not taking any of the above blood thinners. Otherwise ask your provider and this will be decided on a case-by-case basis.  Product Common names Stop Time  Aspirin > 325 mg Goody Powders, Excedrin, etc. 11 days  Aspirin <= 81 mg  7 days  Fish oil  4 days  Garlic supplements  7 days  Ginkgo biloba  36 hours  Ginseng  24 hours  NSAIDs Ibuprofen , Naprosyn, etc. 3 days  Vitamin E  4 days   ______________________________________________________________________     ______________________________________________________________________    Procedure instructions  Stop blood-thinners  Do not eat or drink fluids (other than water ) for 8 hours before your procedure  No water  for 2 hours before your procedure  Take your blood pressure medicine with a sip of water   Arrive 30 minutes before your appointment  If sedation is planned, bring suitable driver. [place], [place], & public transportation are NOT APPROVED)  Carefully read the Preparing for your procedure detailed instructions  If you have questions call us  at [phone]  Procedure appointments are for procedures only.   NO medication refills or new problem evaluations will be done on procedure days.   Only the scheduled, pre-approved procedure and side will be done.   ______________________________________________________________________     ______________________________________________________________________    Preparing for your procedure  Appointments: If you think you may not be able to keep your appointment, call 24-48 hours in advance to cancel. We need time  to make it available to others.  Procedure visits are for procedures only. During your procedure appointment there will be: NO Prescription Refills*. NO medication changes or discussions*. NO  discussion of disability issues*. NO unrelated pain problem evaluations*. NO evaluations to order other pain procedures*. *These will be addressed at a separate and distinct evaluation encounter on the provider's evaluation schedule and not during procedure days.  Instructions: Food intake: Avoid eating anything solid for at least 8 hours prior to your procedure. Clear liquid intake: You may take clear liquids such as water  up to 2 hours prior to your procedure. (No carbonated drinks. No soda.) Transportation: Unless otherwise stated by your physician, bring a driver. (Driver cannot be a Market Researcher, Pharmacist, Community, or any other form of public transportation.) Morning Medicines: Except for blood thinners, take all of your other morning medications with a sip of water . Make sure to take your heart and blood pressure medicines. If your blood pressure's lower number is above 100, the case will be rescheduled. Blood thinners: Make sure to stop your blood thinners as instructed.  If you take a blood thinner, but were not instructed to stop it, call our office [phone] and ask to talk to a nurse. Not stopping a blood thinner prior to certain procedures could lead to serious complications. Diabetics on insulin: Notify the staff so that you can be scheduled 1st case in the morning. If your diabetes requires high dose insulin, take only  of your normal insulin dose the morning of the procedure and notify the staff that you have done so. Preventing infections: Shower with an antibacterial soap the morning of your procedure.  Build-up your immune system: Take 1000 mg of Vitamin C with every meal (3 times a day) the day prior to your procedure. Antibiotics: Inform the nursing staff if you are taking any antibiotics or if you have any conditions that may require antibiotics prior to procedures. (Example: recent joint implants)   Pregnancy: If you are pregnant make sure to notify the nursing staff. Not doing so may result in  injury to the fetus, including death.  Sickness: If you have a cold, fever, or any active infections, call and cancel or reschedule your procedure. Receiving steroids while having an infection may result in complications. Arrival: You must be in the facility at least 30 minutes prior to your scheduled procedure. Tardiness: Your scheduled time is also the cutoff time. If you do not arrive at least 15 minutes prior to your procedure, you will be rescheduled.  Children: Do not bring any children with you. Make arrangements to keep them home. Dress appropriately: There is always a possibility that your clothing may get soiled. Avoid long dresses. Valuables: Do not bring any jewelry or valuables.  Reasons to call and reschedule or cancel your procedure: (Following these recommendations will minimize the risk of a serious complication.) Surgeries: Avoid having procedures within 2 weeks of any surgery. (Avoid for 2 weeks before or after any surgery). Flu Shots: Avoid having procedures within 2 weeks of a flu shots or . (Avoid for 2 weeks before or after immunizations). Barium: Avoid having a procedure within 7-10 days after having had a radiological study involving the use of radiological contrast. (Myelograms, Barium swallow or enema study). Heart attacks: Avoid any elective procedures or surgeries for the initial 6 months after a Myocardial Infarction (Heart Attack). Blood thinners: It is imperative that you stop these medications before procedures. Let us  know if you if you take any blood  thinner.  Infection: Avoid procedures during or within two weeks of an infection (including chest colds or gastrointestinal problems). Symptoms associated with infections include: Localized redness, fever, chills, night sweats or profuse sweating, burning sensation when voiding, cough, congestion, stuffiness, runny nose, sore throat, diarrhea, nausea, vomiting, cold or Flu symptoms, recent or current infections. It is  specially important if the infection is over the area that we intend to treat. Heart and lung problems: Symptoms that may suggest an active cardiopulmonary problem include: cough, chest pain, breathing difficulties or shortness of breath, dizziness, ankle swelling, uncontrolled high or unusually low blood pressure, and/or palpitations. If you are experiencing any of these symptoms, cancel your procedure and contact your primary care physician for an evaluation.  Remember:  Regular Business hours are:  Monday to Thursday 8:00 AM to 4:00 PM  Provider's Schedule: Eric Como, MD:  Procedure days: Tuesday and Thursday 7:30 AM to 4:00 PM  Wallie Sherry, MD:  Procedure days: Monday and Wednesday 7:30 AM to 4:00 PM Last  Updated: 11/15/2023 ______________________________________________________________________     ______________________________________________________________________    General Risks and Possible Complications  Patient Responsibilities: It is important that you read this as it is part of your informed consent. It is our duty to inform you of the risks and possible complications associated with treatments offered to you. It is your responsibility as a patient to read this and to ask questions about anything that is not clear or that you believe was not covered in this document.  Patients Rights: You have the right to refuse treatment. You also have the right to change your mind, even after initially having agreed to have the treatment done. However, under this last option, if you wait until the last second to change your mind, you may be charged for the materials used up to that point.  Introduction: Medicine is not an visual merchandiser. Everything in Medicine, including the lack of treatment(s), carries the potential for danger, harm, or loss (which is by definition: Risk). In Medicine, a complication is a secondary problem, condition, or disease that can aggravate an already  existing one. All treatments carry the risk of possible complications. The fact that a side effects or complications occurs, does not imply that the treatment was conducted incorrectly. It must be clearly understood that these can happen even when everything is done following the highest safety standards.  No treatment: You can choose not to proceed with the proposed treatment alternative. The PRO(s) would include: avoiding the risk of complications associated with the therapy. The CON(s) would include: not getting any of the treatment benefits. These benefits fall under one of three categories: diagnostic; therapeutic; and/or palliative. Diagnostic benefits include: getting information which can ultimately lead to improvement of the disease or symptom(s). Therapeutic benefits are those associated with the successful treatment of the disease. Finally, palliative benefits are those related to the decrease of the primary symptoms, without necessarily curing the condition (example: decreasing the pain from a flare-up of a chronic condition, such as incurable terminal cancer).  General Risks and Complications: These are associated to most interventional treatments. They can occur alone, or in combination. They fall under one of the following six (6) categories: no benefit or worsening of symptoms; bleeding; infection; nerve damage; allergic reactions; and/or death. No benefits or worsening of symptoms: In Medicine there are no guarantees, only probabilities. No healthcare provider can ever guarantee that a medical treatment will work, they can only state the probability that it may. Furthermore,  there is always the possibility that the condition may worsen, either directly, or indirectly, as a consequence of the treatment. Bleeding: This is more common if the patient is taking a blood thinner, either prescription or over the counter (example: Goody Powders, Fish oil, Aspirin, Garlic, etc.), or if suffering a  condition associated with impaired coagulation (example: Hemophilia, cirrhosis of the liver, low platelet counts, etc.). However, even if you do not have one on these, it can still happen. If you have any of these conditions, or take one of these drugs, make sure to notify your treating physician. Infection: This is more common in patients with a compromised immune system, either due to disease (example: diabetes, cancer, human immunodeficiency virus [HIV], etc.), or due to medications or treatments (example: therapies used to treat cancer and rheumatological diseases). However, even if you do not have one on these, it can still happen. If you have any of these conditions, or take one of these drugs, make sure to notify your treating physician. Nerve Damage: This is more common when the treatment is an invasive one, but it can also happen with the use of medications, such as those used in the treatment of cancer. The damage can occur to small secondary nerves, or to large primary ones, such as those in the spinal cord and brain. This damage may be temporary or permanent and it may lead to impairments that can range from temporary numbness to permanent paralysis and/or brain death. Allergic Reactions: Any time a substance or material comes in contact with our body, there is the possibility of an allergic reaction. These can range from a mild skin rash (contact dermatitis) to a severe systemic reaction (anaphylactic reaction), which can result in death. Death: In general, any medical intervention can result in death, most of the time due to an unforeseen complication. ______________________________________________________________________      ______________________________________________________________________    Steroid injections  Common steroids for injections Triamcinolone : Used by many sports medicine physicians for large joint and bursal injections, often combined with a local anesthetic like  lidocaine . A study focusing on coccydynia (tailbone pain) found triamcinolone  was more effective than betamethasone, suggesting it may also be preferable for other localized inflammation conditions. Methylprednisolone : A common alternative to triamcinolone  that is also a strong anti-inflammatory. It is available in different formulations, with the acetate suspension being the long-acting option for intra-articular injections. Dexamethasone : This is a non-particulate steroid, meaning it has a lower risk of tissue damage compared to particulate steroids like triamcinolone  and methylprednisolone . While less common for this specific use, it is an option for targeted injections.   Considerations for physicians Particulate vs. non-particulate steroids: Triamcinolone  and methylprednisolone  are particulate, meaning they can clump together. Dexamethasone  is non-particulate. Particulate steroids are often preferred for their longer-lasting effects but carry a theoretical higher risk for certain injections (though this is less of a concern in the costochondral joints). Combined injectate: Corticosteroids are typically mixed with a local anesthetic like lidocaine  to provide both immediate pain relief (from the anesthetic) and longer-term inflammation reduction (from the steroid). Imaging guidance: To ensure accurate placement of the needle and medication, physicians may use ultrasound or fluoroscopic guidance for the injection, especially in complex or refractory cases.   Patient guidance Before undergoing a steroid injection, discuss the options with your physician. They will determine the best steroid, dosage, and procedure for your specific case based on factors like: Severity of your condition History of response to other treatments Your overall health status Experience and  preference of the physician  Last  Updated: 07/31/2024 ______________________________________________________________________

## 2024-12-25 ENCOUNTER — Telehealth: Payer: Self-pay

## 2024-12-25 NOTE — Telephone Encounter (Signed)
 Does the patient have approval to stop plaquenil  for 11 days? I have the auth for her procedure.

## 2024-12-25 NOTE — Telephone Encounter (Signed)
 I will look into it

## 2024-12-27 ENCOUNTER — Ambulatory Visit

## 2024-12-27 VITALS — BP 147/81 | HR 77 | Ht 67.0 in | Wt 180.0 lb

## 2024-12-27 DIAGNOSIS — S42291D Other displaced fracture of upper end of right humerus, subsequent encounter for fracture with routine healing: Secondary | ICD-10-CM

## 2024-12-27 NOTE — Progress Notes (Signed)
 "  Office Visit Note   Patient: Elizabeth Martin           Date of Birth: 1961-08-06           MRN: [id number] Visit Date: 12/27/2024              Requested by: Tobie Border, MD [address] [place],  [place] [address] PCP: Tobie Border, MD   Assessment & Plan: Visit Diagnoses:  1. Other closed displaced fracture of proximal end of right humerus with routine healing, subsequent encounter     Plan: Advised patient to continue resting and icing shoulder and to take NSAIDS for pain along with the tylenol  she is already taking. Will continue PT and follow up in 6 weeks.  Orders:  No orders of the defined types were placed in this encounter.    Subjective: Chief Complaint: Right shoulder pain  HPI Patient is a 64 y.o. female who presents for a follow up appointment for the right shoulder. Patient's symptoms are stable since last visit. Shoulder pain is lateral when she attempts therapy. Reports that the pain makes it difficult to get through therapy. No other complaints.  Objective: Vital Signs: BP (!) 147/81   Pulse 77   Ht 5' 7 (1.702 m)   Wt 81.6 kg   BMI 28.19 kg/m   Physical Exam Gen: Alert, No Acute Distress right shoulder: Skin intact, no erythema or induration noted. soft tissue tenderness of the lateral shoulder at the fracture site, reduced range of motion of right shoulder; unable to abduct past 20 degrees, IR to hip.  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) 10/22/2024    Cervical Anterolisthesis of (C2-3) (dynamic) 10/22/2024   Cervical Grade 1 Retrolisthesis of (C4-5, C5-6) 10/22/2024   Lumbar pars defect (Right) (L4) (postsurgical) 10/22/2024   Lumbar Anterolisthesis of (L4/L5) (8 mm on flexion) (Dynamic) 10/22/2024   Chronic anticoagulation (Plaquenil ) 10/22/2024   Closed fracture of proximal end of right humerus with routine healing 10/22/2024   Chronic pain syndrome 09/05/2024   Pharmacologic therapy 09/05/2024   Disorder of skeletal system 09/05/2024   Problems influencing health status 09/05/2024   Abnormal MRI, lumbar spine (01/10/2023) ([facility]) 09/05/2024   Abnormal NCS (nerve conduction studies) (01/25/2023) 09/05/2024   Chronic hip pain (2ry area of Pain) (Bilateral) (R>L) 09/05/2024   Chronic hip pain after total hip replacement (THR) (Right) 09/05/2024   Cervicalgia 09/05/2024   Oral lesion 09/04/2024   Acute midline low back pain without sciatica 09/04/2024   Pink eye disease of left eye 08/14/2024   Recurrent major depressive disorder, in full remission 07/27/2024   Constipation 01/10/2024   Nausea & vomiting 01/10/2024   History of total hip arthroplasty (THR) (Right) 11/14/2023   Blood coagulation disorder 11/10/2023   PTSD (post-traumatic stress disorder) 11/10/2023   Severe episode of recurrent major depressive disorder, without psychotic features (HCC) 11/10/2023   GAD (generalized anxiety disorder) 11/10/2023   High risk medication use 11/10/2023   S/P partial colectomy 06/16/2023  Decreased range of motion of right knee 06/10/2023   Anxiety and depression 03/22/2023   Cervical radiculopathy 03/16/2023   Colon cancer screening 03/15/2023   Chronic idiopathic constipation 03/15/2023   Diverticulosis of colon 03/15/2023   Dyspnea 03/07/2023   History of [facility] spotted fever 03/07/2023    Class: History of   Strain of gastrocnemius tendon 03/07/2023   Lower abdominal pain 03/07/2023   Pain in pelvis 03/07/2023    Small bowel obstruction (HCC) 02/11/2023   Raynaud disease 02/11/2023   Polyarthralgia 02/11/2023   Edema 01/03/2023   Diverticular disease 12/10/2022   Hot flashes 12/10/2022   Pain of right lower extremity 12/10/2022   Paresthesia of lower extremity 12/10/2022   Primary insomnia 12/10/2022   Thyroid  nodule 12/10/2022   Multiple joint pain 12/10/2022   Basal cell carcinoma of skin 12/10/2022   Arthralgia of multiple joints 12/10/2022   Acute bilateral low back pain without sciatica 11/10/2022   Chronic low back pain (1ry area of Pain) (Bilateral) (R>L) w/o sciatica 09/05/2022   S/P closure of ileostomy 08/24/2022   Ileostomy in place [facility])    Morphea 07/13/2022   Colonic stricture (HCC) 05/19/2022   Large bowel obstruction (HCC) 05/19/2022   Unspecified intestinal obstruction, unspecified as to partial versus complete obstruction (HCC) 05/19/2022   Hypokalemia 05/18/2022   Abdominal pain 05/18/2022   Osteoarthritis of left knee 02/24/2022   Unilateral primary osteoarthritis, left knee 02/24/2022   MCTD (mixed connective tissue disease) 02/17/2022   Glenohumeral arthritis, left 01/27/2022   Microscopic hematuria 01/12/2022   Adhesive capsulitis of left shoulder 01/06/2022   Otorrhagia of right ear 12/10/2021   Anti-TPO antibodies present 10/20/2021   Rash 10/13/2021   Cough 09/07/2021   Hashimoto's thyroiditis 03/23/2021   Hashimoto's disease 03/23/2021   Exposure to severe acute respiratory syndrome coronavirus 2 (SARS-CoV-2) 12/19/2020   Migraine without aura and without status migrainosus, not intractable 08/19/2020   Myofascial pain 07/30/2020   Chronic neck pain (3ry area of Pain) (Bilateral) 07/30/2020   Upper back pain 07/30/2020   Cervical facet joint syndrome 07/30/2020   Hair loss 07/14/2020   Edema of lower extremity (Right) 07/11/2020   Deep venous thrombosis of lower extremity (HCC) 07/11/2020   Adenomatous colon polyp 02/26/2020   Gastroesophageal reflux  disease 02/15/2020   Diarrhea 02/15/2020   Sleep disorder 01/16/2020   Hyperlipidemia 07/11/2019   Right shoulder pain 04/04/2019   Pre-operative clearance 12/13/2018   Pain in right knee 07/26/2018   Primary malignant neoplasm of endometrium (HCC) 04/05/2018   Patellofemoral pain syndrome of right knee 03/20/2018   Gastroenteritis 12/07/2017   Fatigue 11/16/2017   Morbid obesity (HCC) 08/11/2017   Stucco keratoses 08/11/2017   Essential hypertension 08/09/2017   History of endometrial cancer 08/09/2017   Lymphedema 08/09/2017   Menopause 08/09/2017   Malignant neoplastic disease (HCC) 04/07/2017   Cellulitis 09/18/2016   Benign hypertension 06/02/2016   Malignant neoplasm of uterus (HCC) 12/07/2011   Asthma 06/04/1969   Mild intermittent asthma with acute exacerbation 06/04/1969   Past Medical History:  Diagnosis Date   Anxiety    Asthma    Autoimmune disease    Cancer (HCC)    Endometrial lymph node removal (30)   Corneal abrasion, right 05/24/2022   Depression    GERD (gastroesophageal reflux disease)    H/O blood clots    upper rt groin and behind both knees   History of hiatal hernia    Hypertension    Lymphedema  right leg   Morphea scleroderma    PONV (postoperative nausea and vomiting)    states gets violently ill    Family History  Problem Relation Age of Onset   Mental illness Neg Hx     Past Surgical History:  Procedure Laterality Date   BASAL CELL CARCINOMA EXCISION Left    CHOLECYSTECTOMY  2008   FLEXIBLE SIGMOIDOSCOPY N/A 05/19/2022   Procedure: FLEXIBLE SIGMOIDOSCOPY;  Surgeon: Toledo, Ladell POUR, MD;  Location: [facility];  Service: Gastroenterology;  Laterality: N/A;   HERNIA REPAIR  2004   ILEOSTOMY CLOSURE N/A 08/24/2022   Procedure: ILEOSTOMY TAKEDOWN, open loop with Arthea Platt, PA-C to assist;  Surgeon: Desiderio Schanz, MD;  Location: [facility];  Service: General;  Laterality: N/A;   IVC FILTER INSERTION N/A 11/09/2023   Procedure:  IVC FILTER INSERTION;  Surgeon: Marea Selinda RAMAN, MD;  Location: [facility];  Service: Cardiovascular;  Laterality: N/A;   IVC FILTER REMOVAL N/A 01/02/2024   Procedure: IVC FILTER REMOVAL;  Surgeon: Marea Selinda RAMAN, MD;  Location: [facility];  Service: Cardiovascular;  Laterality: N/A;   PLANTAR FASCIECTOMY     ROBOTIC ASSISTED TOTAL HYSTERECTOMY WITH BILATERAL SALPINGO OOPHERECTOMY  02/14/2012   ROTATOR CUFF REPAIR Left 03/24/2022   TONSILLECTOMY     TOTAL HIP ARTHROPLASTY Right 11/14/2023   Procedure: TOTAL HIP ARTHROPLASTY - posterior;  Surgeon: Lorelle Arthea, MD;  Location: [facility];  Service: Orthopedics;  Laterality: Right;   XI ROBOTIC ASSISTED LOWER ANTERIOR RESECTION N/A 05/24/2022   Procedure: XI ROBOTIC ASSISTED LOWER ANTERIOR RESECTION;  Surgeon: Desiderio Schanz, MD;  Location: [facility];  Service: General;  Laterality: N/A;   Social History   Occupational History   Not on file  Tobacco Use   Smoking status: Never   Smokeless tobacco: Never  Vaping Use   Vaping status: Never Used  Substance and Sexual Activity   Alcohol use: Yes   Drug use: Never   Sexual activity: Not Currently   Current Outpatient Medications  Medication Instructions   acetaminophen  (TYLENOL ) 1,000 mg, Oral, Every 8 hours   albuterol  (VENTOLIN  HFA) 108 (90 Base) MCG/ACT inhaler 2 puffs, Every 6 hours PRN   Alum Hydroxide-Mag Carbonate (GAVISCON PO) 4 tablets, Daily PRN   budesonide-formoterol (SYMBICORT) 80-4.5 MCG/ACT inhaler 2 puffs   buPROPion  (WELLBUTRIN  XL) 150 mg, Oral, Every morning   celecoxib  (CELEBREX ) 200 mg, 2 times daily   cetirizine (ZYRTEC) 10 mg, Daily   CLOBETASOL PROPIONATE E 0.05 % emollient cream Apply topically.   desonide (DESOWEN) 0.05 % cream 1 Application, 2 times daily PRN   famotidine -calcium carbonate-magnesium  hydroxide (PEPCID  COMPLETE) 10-800-165 MG chewable tablet 1 tablet, Daily PRN   folic acid (FOLVITE) 1 mg, Daily   gabapentin  (NEURONTIN ) 600 mg, 3  times daily   hydrocortisone-pramoxine (ANALPRAM-HC) 2.5-1 % rectal cream 1 Application, 3 times daily PRN   hydroxychloroquine  (PLAQUENIL ) 200 mg, 2 times daily   lisinopril  (ZESTRIL ) 40 mg, Daily   methotrexate (RHEUMATREX) 10 mg, 2 times weekly   omeprazole (PRILOSEC) 40 mg, Daily   ondansetron  (ZOFRAN -ODT) 4 mg, Every 8 hours PRN   ondansetron  (ZOFRAN -ODT) 8 mg, Every 8 hours PRN   polyethylene glycol (MIRALAX  / GLYCOLAX ) 17 g   promethazine  (PHENERGAN ) 12.5 mg, Every 8 hours PRN   scopolamine  (TRANSDERM-SCOP) 1 mg   silver sulfADIAZINE (SILVADENE) 1 % cream 1 Application, Daily   traZODone  (DESYREL ) 50 mg, Oral, At bedtime PRN, for sleep   triamcinolone  (KENALOG ) 0.025 % cream  1 Application, 2 times daily PRN   trospium  (SANCTURA ) 20 mg, Oral, 2 times daily PRN   venlafaxine  XR (EFFEXOR -XR) 75 mg, Oral, Daily with breakfast   Allergies as of 12/27/2024 - Review Complete 12/24/2024  Allergen Reaction Noted   Carboplatin Anaphylaxis 09/20/2012   Latex Hives, Itching, and Rash 08/16/2022   Nitrofurantoin Nausea And Vomiting 02/01/2012   Silicone Itching and Rash 02/01/2012   Codeine Nausea And Vomiting 12/15/2023   Wheat Other (See Comments) 01/28/2019    "

## 2025-01-14 ENCOUNTER — Ambulatory Visit: Admitting: Occupational Therapy

## 2025-01-21 ENCOUNTER — Ambulatory Visit: Admitting: Occupational Therapy

## 2025-01-24 ENCOUNTER — Ambulatory Visit: Admitting: Licensed Clinical Social Worker

## 2025-01-28 ENCOUNTER — Ambulatory Visit: Admitting: Occupational Therapy

## 2025-02-04 ENCOUNTER — Ambulatory Visit: Admitting: Occupational Therapy

## 2025-02-06 ENCOUNTER — Telehealth: Admitting: Psychiatry

## 2025-02-11 ENCOUNTER — Ambulatory Visit: Admitting: Occupational Therapy

## 2025-02-18 ENCOUNTER — Ambulatory Visit: Admitting: Occupational Therapy

## 2025-02-25 ENCOUNTER — Ambulatory Visit: Admitting: Occupational Therapy

## 2025-02-27 ENCOUNTER — Ambulatory Visit: Admitting: Occupational Therapy

## 2025-03-04 ENCOUNTER — Ambulatory Visit: Admitting: Occupational Therapy

## 2025-03-11 ENCOUNTER — Ambulatory Visit: Admitting: Occupational Therapy

## 2025-03-18 ENCOUNTER — Ambulatory Visit: Admitting: Occupational Therapy

## 2025-03-25 ENCOUNTER — Ambulatory Visit: Admitting: Occupational Therapy

## 2025-03-27 ENCOUNTER — Ambulatory Visit: Admitting: Occupational Therapy

## 2025-03-28 ENCOUNTER — Ambulatory Visit: Admitting: Orthopedic Surgery

## 2025-04-01 ENCOUNTER — Ambulatory Visit: Admitting: Occupational Therapy

## 2025-04-03 ENCOUNTER — Ambulatory Visit: Admitting: Occupational Therapy

## 2025-04-08 ENCOUNTER — Ambulatory Visit: Admitting: Occupational Therapy

## 2025-04-10 ENCOUNTER — Ambulatory Visit: Admitting: Occupational Therapy
